# Patient Record
Sex: Female | Born: 1965
Health system: Southern US, Community
[De-identification: ages and names within clinical notes are randomized; demographics above are authoritative.]

## PROBLEM LIST (undated history)

## (undated) DIAGNOSIS — K449 Diaphragmatic hernia without obstruction or gangrene: Secondary | ICD-10-CM

## (undated) DIAGNOSIS — I491 Atrial premature depolarization: Secondary | ICD-10-CM

## (undated) DIAGNOSIS — I10 Essential (primary) hypertension: Secondary | ICD-10-CM

## (undated) DIAGNOSIS — Z9889 Other specified postprocedural states: Secondary | ICD-10-CM

## (undated) DIAGNOSIS — E282 Polycystic ovarian syndrome: Secondary | ICD-10-CM

## (undated) DIAGNOSIS — K219 Gastro-esophageal reflux disease without esophagitis: Secondary | ICD-10-CM

## (undated) DIAGNOSIS — D509 Iron deficiency anemia, unspecified: Secondary | ICD-10-CM

## (undated) DIAGNOSIS — IMO0001 Reserved for inherently not codable concepts without codable children: Secondary | ICD-10-CM

## (undated) DIAGNOSIS — Z9109 Other allergy status, other than to drugs and biological substances: Secondary | ICD-10-CM

## (undated) DIAGNOSIS — J302 Other seasonal allergic rhinitis: Secondary | ICD-10-CM

## (undated) DIAGNOSIS — F419 Anxiety disorder, unspecified: Secondary | ICD-10-CM

## (undated) DIAGNOSIS — R112 Nausea with vomiting, unspecified: Secondary | ICD-10-CM

## (undated) DIAGNOSIS — E785 Hyperlipidemia, unspecified: Secondary | ICD-10-CM

## (undated) DIAGNOSIS — R002 Palpitations: Secondary | ICD-10-CM

## (undated) DIAGNOSIS — R7303 Prediabetes: Secondary | ICD-10-CM

## (undated) DIAGNOSIS — G43909 Migraine, unspecified, not intractable, without status migrainosus: Secondary | ICD-10-CM

## (undated) HISTORY — DX: Diaphragmatic hernia without obstruction or gangrene: K44.9

## (undated) HISTORY — DX: Polycystic ovarian syndrome: E28.2

## (undated) HISTORY — DX: Essential (primary) hypertension: I10

## (undated) HISTORY — DX: Other seasonal allergic rhinitis: J30.2

## (undated) HISTORY — DX: Atrial premature depolarization: I49.1

## (undated) HISTORY — DX: Gastro-esophageal reflux disease without esophagitis: K21.9

## (undated) HISTORY — DX: Reserved for inherently not codable concepts without codable children: IMO0001

## (undated) HISTORY — DX: Migraine, unspecified, not intractable, without status migrainosus: G43.909

## (undated) HISTORY — DX: Iron deficiency anemia, unspecified: D50.9

## (undated) HISTORY — DX: Hyperlipidemia, unspecified: E78.5

---
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
## 1998-02-18 ENCOUNTER — Emergency Department (HOSPITAL_COMMUNITY): Admission: EM | Admit: 1998-02-18 | Discharge: 1998-02-18 | Payer: Self-pay | Admitting: Emergency Medicine

## 1998-04-16 ENCOUNTER — Emergency Department (HOSPITAL_COMMUNITY): Admission: EM | Admit: 1998-04-16 | Discharge: 1998-04-16 | Payer: Self-pay | Admitting: Emergency Medicine

## 1999-02-09 ENCOUNTER — Other Ambulatory Visit: Admission: RE | Admit: 1999-02-09 | Discharge: 1999-02-09 | Payer: Self-pay | Admitting: *Deleted

## 1999-04-03 ENCOUNTER — Other Ambulatory Visit: Admission: RE | Admit: 1999-04-03 | Discharge: 1999-04-03 | Payer: Self-pay | Admitting: *Deleted

## 1999-09-26 ENCOUNTER — Other Ambulatory Visit: Admission: RE | Admit: 1999-09-26 | Discharge: 1999-09-26 | Payer: Self-pay | Admitting: *Deleted

## 2000-01-30 ENCOUNTER — Other Ambulatory Visit: Admission: RE | Admit: 2000-01-30 | Discharge: 2000-01-30 | Payer: Self-pay | Admitting: *Deleted

## 2000-10-15 ENCOUNTER — Other Ambulatory Visit: Admission: RE | Admit: 2000-10-15 | Discharge: 2000-10-15 | Payer: Self-pay | Admitting: *Deleted

## 2001-02-19 ENCOUNTER — Other Ambulatory Visit: Admission: RE | Admit: 2001-02-19 | Discharge: 2001-02-19 | Payer: Self-pay | Admitting: *Deleted

## 2001-10-29 ENCOUNTER — Other Ambulatory Visit: Admission: RE | Admit: 2001-10-29 | Discharge: 2001-10-29 | Payer: Self-pay | Admitting: Obstetrics and Gynecology

## 2002-03-01 ENCOUNTER — Emergency Department (HOSPITAL_COMMUNITY): Admission: EM | Admit: 2002-03-01 | Discharge: 2002-03-01 | Payer: Self-pay | Admitting: Emergency Medicine

## 2002-03-01 ENCOUNTER — Encounter: Payer: Self-pay | Admitting: Emergency Medicine

## 2002-03-17 ENCOUNTER — Encounter: Payer: Self-pay | Admitting: Gastroenterology

## 2002-03-17 ENCOUNTER — Ambulatory Visit (HOSPITAL_COMMUNITY): Admission: RE | Admit: 2002-03-17 | Discharge: 2002-03-17 | Payer: Self-pay | Admitting: Gastroenterology

## 2002-10-06 ENCOUNTER — Ambulatory Visit (HOSPITAL_COMMUNITY): Admission: RE | Admit: 2002-10-06 | Discharge: 2002-10-06 | Payer: Self-pay | Admitting: Gastroenterology

## 2002-11-18 ENCOUNTER — Other Ambulatory Visit: Admission: RE | Admit: 2002-11-18 | Discharge: 2002-11-18 | Payer: Self-pay | Admitting: Obstetrics and Gynecology

## 2002-12-10 ENCOUNTER — Encounter: Payer: Self-pay | Admitting: Emergency Medicine

## 2002-12-10 ENCOUNTER — Emergency Department (HOSPITAL_COMMUNITY): Admission: EM | Admit: 2002-12-10 | Discharge: 2002-12-10 | Payer: Self-pay | Admitting: Emergency Medicine

## 2004-07-24 ENCOUNTER — Other Ambulatory Visit: Admission: RE | Admit: 2004-07-24 | Discharge: 2004-07-24 | Payer: Self-pay | Admitting: Internal Medicine

## 2005-06-21 ENCOUNTER — Inpatient Hospital Stay (HOSPITAL_COMMUNITY): Admission: AD | Admit: 2005-06-21 | Discharge: 2005-06-21 | Payer: Self-pay | Admitting: Obstetrics and Gynecology

## 2005-07-12 ENCOUNTER — Encounter (INDEPENDENT_AMBULATORY_CARE_PROVIDER_SITE_OTHER): Payer: Self-pay | Admitting: Specialist

## 2005-07-12 ENCOUNTER — Ambulatory Visit (HOSPITAL_COMMUNITY): Admission: RE | Admit: 2005-07-12 | Discharge: 2005-07-12 | Payer: Self-pay | Admitting: Obstetrics and Gynecology

## 2006-02-06 ENCOUNTER — Ambulatory Visit (HOSPITAL_COMMUNITY): Admission: AD | Admit: 2006-02-06 | Discharge: 2006-02-06 | Payer: Self-pay | Admitting: Obstetrics and Gynecology

## 2006-08-11 ENCOUNTER — Inpatient Hospital Stay (HOSPITAL_COMMUNITY): Admission: AD | Admit: 2006-08-11 | Discharge: 2006-08-11 | Payer: Self-pay | Admitting: Obstetrics and Gynecology

## 2006-08-27 ENCOUNTER — Inpatient Hospital Stay (HOSPITAL_COMMUNITY): Admission: RE | Admit: 2006-08-27 | Discharge: 2006-08-28 | Payer: Self-pay | Admitting: Obstetrics and Gynecology

## 2007-02-15 ENCOUNTER — Emergency Department (HOSPITAL_COMMUNITY): Admission: EM | Admit: 2007-02-15 | Discharge: 2007-02-15 | Payer: Self-pay | Admitting: Family Medicine

## 2008-01-25 ENCOUNTER — Emergency Department (HOSPITAL_COMMUNITY): Admission: EM | Admit: 2008-01-25 | Discharge: 2008-01-25 | Payer: Self-pay | Admitting: Emergency Medicine

## 2008-04-15 ENCOUNTER — Ambulatory Visit: Payer: Self-pay | Admitting: Internal Medicine

## 2008-04-15 DIAGNOSIS — J309 Allergic rhinitis, unspecified: Secondary | ICD-10-CM | POA: Insufficient documentation

## 2008-04-15 DIAGNOSIS — J45909 Unspecified asthma, uncomplicated: Secondary | ICD-10-CM | POA: Insufficient documentation

## 2008-04-15 DIAGNOSIS — E785 Hyperlipidemia, unspecified: Secondary | ICD-10-CM

## 2008-04-15 HISTORY — DX: Hyperlipidemia, unspecified: E78.5

## 2008-04-21 ENCOUNTER — Ambulatory Visit: Payer: Self-pay | Admitting: Internal Medicine

## 2008-04-21 DIAGNOSIS — J069 Acute upper respiratory infection, unspecified: Secondary | ICD-10-CM | POA: Insufficient documentation

## 2008-04-30 ENCOUNTER — Emergency Department (HOSPITAL_COMMUNITY): Admission: EM | Admit: 2008-04-30 | Discharge: 2008-04-30 | Payer: Self-pay | Admitting: Emergency Medicine

## 2008-05-12 ENCOUNTER — Encounter: Payer: Self-pay | Admitting: Internal Medicine

## 2008-05-19 ENCOUNTER — Encounter: Admission: RE | Admit: 2008-05-19 | Discharge: 2008-05-19 | Payer: Self-pay | Admitting: *Deleted

## 2008-05-27 ENCOUNTER — Ambulatory Visit (HOSPITAL_COMMUNITY): Admission: RE | Admit: 2008-05-27 | Discharge: 2008-05-27 | Payer: Self-pay | Admitting: *Deleted

## 2008-06-01 ENCOUNTER — Ambulatory Visit (HOSPITAL_COMMUNITY): Admission: RE | Admit: 2008-06-01 | Discharge: 2008-06-01 | Payer: Self-pay | Admitting: *Deleted

## 2008-06-14 ENCOUNTER — Encounter: Payer: Self-pay | Admitting: Internal Medicine

## 2008-07-04 ENCOUNTER — Encounter: Payer: Self-pay | Admitting: Internal Medicine

## 2008-07-20 ENCOUNTER — Encounter: Admission: RE | Admit: 2008-07-20 | Discharge: 2008-09-13 | Payer: Self-pay | Admitting: *Deleted

## 2008-07-28 ENCOUNTER — Encounter: Payer: Self-pay | Admitting: Internal Medicine

## 2008-08-08 ENCOUNTER — Ambulatory Visit (HOSPITAL_COMMUNITY): Admission: RE | Admit: 2008-08-08 | Discharge: 2008-08-08 | Payer: Self-pay | Admitting: *Deleted

## 2008-08-08 HISTORY — PX: LAPAROSCOPIC GASTRIC BANDING: SHX1100

## 2008-09-01 ENCOUNTER — Encounter: Payer: Self-pay | Admitting: Internal Medicine

## 2008-09-22 ENCOUNTER — Encounter: Admission: RE | Admit: 2008-09-22 | Discharge: 2008-09-22 | Payer: Self-pay | Admitting: *Deleted

## 2008-11-14 ENCOUNTER — Telehealth: Payer: Self-pay | Admitting: Internal Medicine

## 2008-11-23 ENCOUNTER — Encounter: Payer: Self-pay | Admitting: Internal Medicine

## 2009-01-25 ENCOUNTER — Encounter: Payer: Self-pay | Admitting: Internal Medicine

## 2009-03-03 ENCOUNTER — Encounter: Payer: Self-pay | Admitting: Internal Medicine

## 2009-09-01 ENCOUNTER — Ambulatory Visit: Payer: Self-pay | Admitting: Internal Medicine

## 2009-12-06 ENCOUNTER — Telehealth: Payer: Self-pay | Admitting: Internal Medicine

## 2010-02-03 ENCOUNTER — Emergency Department (HOSPITAL_BASED_OUTPATIENT_CLINIC_OR_DEPARTMENT_OTHER): Admission: EM | Admit: 2010-02-03 | Discharge: 2010-02-03 | Payer: Self-pay | Admitting: Emergency Medicine

## 2010-02-03 ENCOUNTER — Ambulatory Visit: Payer: Self-pay | Admitting: Radiology

## 2010-03-05 ENCOUNTER — Ambulatory Visit (HOSPITAL_COMMUNITY): Admission: RE | Admit: 2010-03-05 | Discharge: 2010-03-05 | Payer: Self-pay | Admitting: Otolaryngology

## 2010-03-08 ENCOUNTER — Emergency Department (HOSPITAL_COMMUNITY): Admission: EM | Admit: 2010-03-08 | Discharge: 2010-03-08 | Payer: Self-pay | Admitting: Emergency Medicine

## 2010-05-03 ENCOUNTER — Ambulatory Visit: Payer: Self-pay | Admitting: Internal Medicine

## 2010-05-03 LAB — CONVERTED CEMR LAB
AST: 18 units/L (ref 0–37)
Albumin: 4.2 g/dL (ref 3.5–5.2)
Basophils Relative: 0.8 % (ref 0.0–3.0)
Bilirubin, Direct: 0.1 mg/dL (ref 0.0–0.3)
Chloride: 106 meq/L (ref 96–112)
Creatinine, Ser: 0.7 mg/dL (ref 0.4–1.2)
Direct LDL: 131.2 mg/dL
Eosinophils Relative: 2.9 % (ref 0.0–5.0)
GFR calc non Af Amer: 97.46 mL/min (ref >60)
Glucose, Urine, Semiquant: NEGATIVE
HCT: 40.4 % (ref 36.0–46.0)
HDL: 55 mg/dL (ref >39.00)
Hemoglobin: 13.8 g/dL (ref 12.0–15.0)
Lymphocytes Relative: 33 % (ref 12.0–46.0)
MCHC: 34.2 g/dL (ref 30.0–36.0)
Monocytes Absolute: 0.4 10*3/uL (ref 0.1–1.0)
Neutro Abs: 3.7 10*3/uL (ref 1.4–7.7)
Platelets: 207 10*3/uL (ref 150.0–400.0)
RDW: 13.3 % (ref 11.5–14.6)
Specific Gravity, Urine: >=1.03
TSH: 2.1 microintl units/mL (ref 0.35–5.50)
Total Protein: 7.1 g/dL (ref 6.0–8.3)
WBC Urine, dipstick: NEGATIVE
pH: 5.5

## 2010-05-14 ENCOUNTER — Ambulatory Visit: Payer: Self-pay | Admitting: Internal Medicine

## 2010-06-29 ENCOUNTER — Emergency Department (HOSPITAL_COMMUNITY): Admission: EM | Admit: 2010-06-29 | Discharge: 2010-06-29 | Payer: Self-pay | Admitting: Family Medicine

## 2010-09-24 ENCOUNTER — Encounter: Admission: RE | Admit: 2010-09-24 | Discharge: 2010-09-24 | Payer: Self-pay | Admitting: Surgery

## 2010-12-04 ENCOUNTER — Ambulatory Visit (HOSPITAL_COMMUNITY)
Admission: RE | Admit: 2010-12-04 | Discharge: 2010-12-04 | Payer: Self-pay | Source: Home / Self Care | Attending: Surgery | Admitting: Surgery

## 2010-12-04 NOTE — Progress Notes (Signed)
Summary: pinkeye  Phone Note Call from Patient Call back at [facility] Phone [phone]   Summary of Call: Has gotten pinkeye from her 43 year old son who is on augmentin for it & other problems.  For her pinkeye, crusty, gross right eye she awoke with this am, she is requesting callin med gtts to [facility].  Allerigc to Pcn. Initial call taken by: Rudy Jew, RN,  December 06, 2009 9:32 AM  Follow-up for Phone Call        Generic bleph 10 ophth drops 10 cc 2 gtts QID  Follow-up by: Gordy Savers  MD,  December 06, 2009 12:35 PM    New/Updated Medications: BLEPH-10 10 % SOLN (SULFACETAMIDE SODIUM) 2 drops qid Prescriptions: BLEPH-10 10 % SOLN (SULFACETAMIDE SODIUM) 2 drops qid  #1 x 0   Entered by:   Rudy Jew, RN   Authorized by:   Gordy Savers  MD   Signed by:   Rudy Jew, RN on 12/06/2009   Method used:   Electronically to        [phone] (retail)       [address]       [place], [place]  [address]       Ph: [phone] or [phone]       Fax: [phone]   RxID:   [id number]

## 2010-12-04 NOTE — Assessment & Plan Note (Signed)
Summary: cpx//ccm   Vital Signs:  Patient profile:   45 year old female Height:      68 inches Weight:      245 pounds BMI:     37.39 Temp:     98.1 degrees F oral BP sitting:   112 / 78  (right arm) Cuff size:   regular  Vitals Entered By: Duard Brady LPN (May 14, 2010 1:58 PM) CC: cpx - doing well Is Patient Diabetic? No   CC:  cpx - doing well.  History of Present Illness: 45 year old patient who is seen today for a health maintenance  examination.  She presently is participating in a NP program at [facility].  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  She is doing quite well.  She is followed by allergy for her asthma and allergic eyes.  She has a history of mild dyslipidemia, and exogenous obesity.  Allergies: 1)  ! Penicillin G Sodium (Penicillin G Sodium)  Past History:  Past Medical History: Reviewed history from 04/15/2008 and no changes required. Allergic rhinitis Asthma obesity history migraine headaches Hyperlipidemia borderline gravida two, para two, abortus zero polycystic ovarian syndrome  Past Surgical History: Tonsillectomy  1979 Colonoscopy 2005  s/p lap band 10/09  Family History: Reviewed history from 04/15/2008 and no changes required. father age 17, hypertension, obesity mother, age 36, hypertension, type 2 diabetes, obesity, colonic polyps  one sister, obesity, cardiac dysrhythmia  Social History: Reviewed history from 04/15/2008 and no changes required. 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    Physical Exam  General:  overweight-appearing.  low-normal blood pressureoverweight-appearing.   Head:  Normocephalic and atraumatic without obvious abnormalities. No apparent alopecia or balding. Eyes:  No corneal or conjunctival inflammation noted. EOMI. Perrla. Funduscopic exam benign, without hemorrhages, exudates or papilledema. Vision grossly normal. Ears:  External ear exam shows no significant lesions or deformities.  Otoscopic examination reveals clear canals, tympanic membranes are intact bilaterally without bulging, retraction, inflammation or discharge. Hearing is grossly normal bilaterally. Nose:  External nasal examination shows no deformity or inflammation. Nasal mucosa are pink and moist without lesions or exudates. Mouth:  Oral mucosa and oropharynx without lesions or exudates.  Teeth in good repair. Neck:  No deformities, masses, or tenderness noted. Chest Wall:  No deformities, masses, or tenderness noted. Lungs:  Normal respiratory effort, chest expands symmetrically. Lungs are clear to auscultation, no crackles or wheezes. Heart:  Normal rate and regular rhythm. S1 and S2 normal without gallop, murmur, click, rub or other extra sounds. Abdomen:  Bowel sounds positive,abdomen soft and non-tender without masses, organomegaly or hernias noted. Msk:  No deformity or scoliosis noted of thoracic or lumbar spine.   Pulses:  R and L carotid,radial,femoral,dorsalis pedis and posterior tibial pulses are full and equal bilaterally Extremities:  No clubbing, cyanosis, edema, or deformity noted with normal full range of motion of all joints.   Neurologic:  No cranial nerve deficits noted. Station and gait are normal. Plantar reflexes are down-going bilaterally. DTRs are symmetrical throughout. Sensory, motor and coordinative functions appear intact. Skin:  Intact without  suspicious lesions or rashes Cervical Nodes:  No lymphadenopathy noted Axillary Nodes:  No palpable lymphadenopathy Inguinal Nodes:  No significant adenopathy Psych:  Cognition and judgment appear intact. Alert and cooperative with normal attention span and concentration. No apparent delusions, illusions, hallucinations   Impression & Recommendations:  Problem # 1:  Preventive Health Care (ICD-V70.0)  Complete Medication List: 1)  Xopenex Hfa 45 Mcg/act Aero (Levalbuterol tartrate) .... Uad 2)  Singulair 10 Mg Tabs (Montelukast sodium) .... One daily  Patient Instructions: 1)  It is important that you exercise regularly at least 20 minutes 5 times a week. If you develop chest pain, have severe difficulty breathing, or feel very tired , stop exercising immediately and seek medical attention. 2)  You need to lose weight. Consider a lower calorie diet and regular exercise.  3)  gynecologic follow-up as planned Prescriptions: SINGULAIR 10 MG  TABS (MONTELUKAST SODIUM) one daily  #90 x 6   Entered and Authorized by:   Gordy Savers  MD   Signed by:   Gordy Savers  MD on 05/14/2010   Method used:   Print then Give to Patient   RxID:   [id number] HFA 45 MCG/ACT  AERO (LEVALBUTEROL TARTRATE) UAD  #3 x 6   Entered and Authorized by:   Gordy Savers  MD   Signed by:   Gordy Savers  MD on 05/14/2010   Method used:   Print then Give to Patient   RxID:   [id number]

## 2010-12-13 NOTE — Op Note (Signed)
Olivia [name], Olivia [name]             ACCOUNT NO.:  [id number]  MEDICAL RECORD NO.:  [id number]          PATIENT TYPE:  AMB  LOCATION:  ENDO                         FACILITY:  [facility]  PHYSICIAN:  Sandria Bales. Ezzard Standing, M.D.  DATE OF BIRTH:  [birth year]  DATE OF PROCEDURE: 04 December 2010                              OPERATIVE REPORT   PREOPERATIVE DIAGNOSIS:  Gastroesophageal reflux/lap band.  POSTOPERATIVE DIAGNOSIS:  Normal position of lap band with normal esophagus, stomach, and duodenum.  PROCEDURES:  Esophagogastroduodenoscopy.  SURGEON:  Sandria Bales. Ezzard Standing, MD  ANESTHESIA:  Fentanyl 75 mcg and Versed 7.5 mg.  COMPLICATIONS:  None.  INDICATION FOR PROCEDURE:  Olivia [name] is a 45 year old white female, who sees Dr. Eleonore Chiquito as her primary medical doctor.  She had an AP standard lap band placed by Dr. Baruch Merl in October 2009 and did well for about 2 years.  She has recently struggled with some gastroesophageal reflux.  She has seen Lenard Forth, physician assistant's office with band fills and has struggled a little bit.  She had an upper GI on September 24, 2010, that showed some full colon gastroesophageal reflux.  Mardelle Matte gave her a band holiday.  They started refilling fluid back.  She is doing better, but now comes for endoscopy just to document the position of the band.  The indications and the potential complications of procedure explained to the patient. Potential complications include, but are not limited to bleeding, infection, bowel injury.  OPERATIVE NOTE:  The patient was placed in a supine position.  The back of her throat was anesthetized with Cetacaine.  She was then rolled in a left lateral decubitus position.  A bite block was placed in her mouth. She was monitored with EKG, pulse oximetry and blood pressure cuff and had 2 L of nasal O2 flowing during the procedure.  She was given 7.5 mg of Versed, 75 mcg of fentanyl.  I used a flexible Pentax upper  endoscope and passed it down the back of her throat without difficulty.  I advanced the scope into the duodenum, which was unremarkable.  The pylorus was unremarkable.  The stomach itself was unremarkable.  I retroflexed the scope and identified the imprint of the band, which was unremarkable.  No evidence of erosion or ulcer.    The band orifice was at about 42 cm.  The Z-line was about 40 cm for about a 2-3 cm pouch, which repaired appropriate.  The distal esophagus was unremarkable for any gastritis or esophagitis or inflammation and the scope was then withdrawn.  The patient tolerated the procedure well.  The evaluation was considered normal.  We will continue to tighten her band.  In talking to her, probably her band at one time was too tight and I think just conceptually understanding that you can not leave the band so tight, that it stops liquids.   Sandria Bales. Ezzard Standing, M.D., FACS   DHN/MEDQ  D:  12/04/2010  T:  12/04/2010  Job:  [id number]  cc:   Gordy Savers, MD [address] [place] [address]  Electronically Signed by Ovidio Kin M.D. on 12/13/2010  11:11:46 AM

## 2011-01-26 ENCOUNTER — Emergency Department (HOSPITAL_COMMUNITY)
Admission: EM | Admit: 2011-01-26 | Discharge: 2011-01-26 | Disposition: A | Discharge status: Home / Self Care | Payer: 59 | Attending: Emergency Medicine | Admitting: Emergency Medicine

## 2011-01-26 DIAGNOSIS — R300 Dysuria: Secondary | ICD-10-CM | POA: Insufficient documentation

## 2011-01-26 DIAGNOSIS — N12 Tubulo-interstitial nephritis, not specified as acute or chronic: Secondary | ICD-10-CM | POA: Insufficient documentation

## 2011-01-26 DIAGNOSIS — J45909 Unspecified asthma, uncomplicated: Secondary | ICD-10-CM | POA: Insufficient documentation

## 2011-01-26 DIAGNOSIS — R509 Fever, unspecified: Secondary | ICD-10-CM | POA: Insufficient documentation

## 2011-01-26 DIAGNOSIS — R109 Unspecified abdominal pain: Secondary | ICD-10-CM | POA: Insufficient documentation

## 2011-01-26 LAB — URINE MICROSCOPIC-ADD ON

## 2011-01-26 LAB — DIFFERENTIAL
Eosinophils Absolute: 0 10*3/uL (ref 0.0–0.7)
Eosinophils Relative: 0 % (ref 0–5)
Lymphocytes Relative: 2 % — ABNORMAL LOW (ref 12–46)
Lymphs Abs: 0.1 10*3/uL — ABNORMAL LOW (ref 0.7–4.0)
Monocytes Absolute: 0.3 10*3/uL (ref 0.1–1.0)
Monocytes Relative: 5 % (ref 3–12)
Neutro Abs: 5.2 10*3/uL (ref 1.7–7.7)

## 2011-01-26 LAB — BASIC METABOLIC PANEL
BUN: 9 mg/dL (ref 6–23)
Chloride: 104 mEq/L (ref 96–112)
GFR calc Af Amer: >60 mL/min (ref >60)
Glucose, Bld: 128 mg/dL — ABNORMAL HIGH (ref 70–99)
Potassium: 3.5 mEq/L (ref 3.5–5.1)

## 2011-01-26 LAB — URINALYSIS, ROUTINE W REFLEX MICROSCOPIC
Glucose, UA: NEGATIVE mg/dL
Urobilinogen, UA: 2 mg/dL — ABNORMAL HIGH (ref 0.0–1.0)

## 2011-01-26 LAB — CBC
RDW: 13.7 % (ref 11.5–15.5)
WBC: 5.6 10*3/uL (ref 4.0–10.5)

## 2011-01-27 ENCOUNTER — Inpatient Hospital Stay (HOSPITAL_COMMUNITY)
Admission: EM | Admit: 2011-01-27 | Discharge: 2011-01-29 | DRG: 690 | Disposition: A | Discharge status: Home / Self Care | Payer: 59 | Attending: Internal Medicine | Admitting: Internal Medicine

## 2011-01-27 ENCOUNTER — Emergency Department (HOSPITAL_COMMUNITY): Payer: 59

## 2011-01-27 DIAGNOSIS — R1115 Cyclical vomiting syndrome unrelated to migraine: Secondary | ICD-10-CM | POA: Diagnosis present

## 2011-01-27 DIAGNOSIS — R7989 Other specified abnormal findings of blood chemistry: Secondary | ICD-10-CM | POA: Diagnosis present

## 2011-01-27 DIAGNOSIS — E876 Hypokalemia: Secondary | ICD-10-CM | POA: Diagnosis present

## 2011-01-27 DIAGNOSIS — T50995A Adverse effect of other drugs, medicaments and biological substances, initial encounter: Secondary | ICD-10-CM | POA: Diagnosis present

## 2011-01-27 DIAGNOSIS — N390 Urinary tract infection, site not specified: Principal | ICD-10-CM | POA: Diagnosis present

## 2011-01-27 DIAGNOSIS — K219 Gastro-esophageal reflux disease without esophagitis: Secondary | ICD-10-CM | POA: Diagnosis present

## 2011-01-27 DIAGNOSIS — Z9884 Bariatric surgery status: Secondary | ICD-10-CM

## 2011-01-27 LAB — CBC
Hemoglobin: 12.3 g/dL (ref 12.0–15.0)
MCV: 80.2 fL (ref 78.0–100.0)
Platelets: 154 10*3/uL (ref 150–400)
RDW: 13.9 % (ref 11.5–15.5)

## 2011-01-27 LAB — DIFFERENTIAL
Basophils Relative: 0 % (ref 0–1)
Eosinophils Absolute: 0.1 10*3/uL (ref 0.0–0.7)
Eosinophils Relative: 3 % (ref 0–5)
Lymphocytes Relative: 6 % — ABNORMAL LOW (ref 12–46)
Monocytes Absolute: 0.3 10*3/uL (ref 0.1–1.0)
Neutro Abs: 4.2 10*3/uL (ref 1.7–7.7)

## 2011-01-27 LAB — BASIC METABOLIC PANEL
BUN: 12 mg/dL (ref 6–23)
CO2: 21 mEq/L (ref 19–32)
Calcium: 8.1 mg/dL — ABNORMAL LOW (ref 8.4–10.5)
Chloride: 105 mEq/L (ref 96–112)
Glucose, Bld: 150 mg/dL — ABNORMAL HIGH (ref 70–99)

## 2011-01-27 LAB — HEPATIC FUNCTION PANEL
Bilirubin, Direct: 1.6 mg/dL — ABNORMAL HIGH (ref 0.0–0.3)
Indirect Bilirubin: 0.8 mg/dL (ref 0.3–0.9)
Total Bilirubin: 2.4 mg/dL — ABNORMAL HIGH (ref 0.3–1.2)

## 2011-01-27 LAB — URINALYSIS, ROUTINE W REFLEX MICROSCOPIC
Glucose, UA: NEGATIVE mg/dL
Ketones, ur: NEGATIVE mg/dL
Protein, ur: NEGATIVE mg/dL

## 2011-01-27 LAB — URINE CULTURE: Colony Count: NO GROWTH

## 2011-01-28 ENCOUNTER — Inpatient Hospital Stay (HOSPITAL_COMMUNITY): Payer: 59

## 2011-01-28 LAB — URINE CULTURE: Colony Count: NO GROWTH

## 2011-01-28 LAB — GLUCOSE, CAPILLARY: Glucose-Capillary: 87 mg/dL (ref 70–99)

## 2011-01-28 LAB — COMPREHENSIVE METABOLIC PANEL
Alkaline Phosphatase: 124 U/L — ABNORMAL HIGH (ref 39–117)
BUN: 4 mg/dL — ABNORMAL LOW (ref 6–23)
CO2: 21 mEq/L (ref 19–32)
Chloride: 107 mEq/L (ref 96–112)
Creatinine, Ser: 0.6 mg/dL (ref 0.4–1.2)
GFR calc non Af Amer: >60 mL/min (ref >60)
Glucose, Bld: 92 mg/dL (ref 70–99)
Total Bilirubin: 2.9 mg/dL — ABNORMAL HIGH (ref 0.3–1.2)

## 2011-01-28 LAB — LIPASE, BLOOD: Lipase: 18 U/L (ref 11–59)

## 2011-01-29 LAB — COMPREHENSIVE METABOLIC PANEL
ALT: 54 U/L — ABNORMAL HIGH (ref 0–35)
AST: 69 U/L — ABNORMAL HIGH (ref 0–37)
Calcium: 8.1 mg/dL — ABNORMAL LOW (ref 8.4–10.5)
Creatinine, Ser: 0.64 mg/dL (ref 0.4–1.2)
GFR calc Af Amer: >60 mL/min (ref >60)
Sodium: 135 mEq/L (ref 135–145)
Total Protein: 5.5 g/dL — ABNORMAL LOW (ref 6.0–8.3)

## 2011-01-29 LAB — HEPATITIS PANEL, ACUTE
HCV Ab: NEGATIVE
Hep A IgM: NEGATIVE
Hep B C IgM: NEGATIVE

## 2011-01-31 NOTE — H&P (Signed)
NAMEGIOVANA, FACIANE             ACCOUNT NO.:  [id number]  MEDICAL RECORD NO.:  [id number]           PATIENT TYPE:  I  LOCATION:  1507                         FACILITY:  [facility]  PHYSICIAN:  Kela Millin, M.D.DATE OF BIRTH:  June 23, 1966  DATE OF ADMISSION:  01/27/2011 DATE OF DISCHARGE:                             HISTORY & PHYSICAL   PRIMARY CARE PHYSICIAN:  Unassigned.  CHIEF COMPLAINT:  Fevers with persistent nausea and dry heaves.  HISTORY OF PRESENT ILLNESS:  The patient is a 45 year old white female with a history of gastric/lap band, GERD, and diagnosed with urinary tract infection 3 days ago and was started on Septra.  She states that her nausea and dry heaving got worse after she was started on the Septra.  She states that she has had minimal-to-no intake because of the persistent nausea and dry heaving.  She states that she was seen in the ED 2 days ago and hydrated with IV fluids, given IV antibiotics, and discharged on Cipro along with Zofran and she felt better initially, but began having the persistent nausea and dry heaving again and so came back to the ED this morning.  She admits to lower abdominal pain as well as dysuria.  She states that she has had fevers for 3-4 days along with right flank pain.  She also complains of dizziness with standing up and has had headaches as well.  No focal weakness.  In the ED, she had a CT scan of her abdomen and pelvis and it showed that she is status post gastric banding without evidence of complications and no acute findings.  Her potassium came back at 3.3 with a sodium of 132 and urinalysis was cloudy in appearance with small leukocyte esterase and 7-10 wbc's, but this is a contaminated specimen with many epithelial cells.  She is admitted for further evaluation and management.  PAST MEDICAL HISTORY: 1. As above. 2. History of asthma - well controlled.  Only uses her inhalers about     1 to 2 times a year. 3.  History of scoliosis. 4. History of seasonal allergies. 5. History of polycystic ovarian syndrome.  ALLERGIES:  PENICILLIN.  SOCIAL HISTORY:  She denies tobacco, and occasional alcohol.  She is a Engineer, civil (consulting).  FAMILY HISTORY:  Positive for hypertension, heart disease, diabetes, and colon cancer.  REVIEW OF SYSTEMS:  As per HPI, other review of systems negative.  PHYSICAL EXAMINATION:  GENERAL:  The patient is a pleasant middle-aged white female in no respiratory distress, intermittently dry heaving during this interview. VITAL SIGNS:  Her temperature is 98.8 and blood pressure 111/74, low of 99/64 in the ED.  Her pulse is 66 initially, larger than one.  The respiratory rate is 18, O2 sat of 98%. HEENT:  PERRL, EOMI, slightly dry mucous membranes.  No oral exudates. NECK:  Supple, no adenopathy, no thyromegaly, and no JVD. LUNGS:  Clear to auscultation bilaterally.  No crackles or wheezes. CARDIOVASCULAR:  Regular rate and rhythm.  Normal S1 and S2. ABDOMEN:  Soft, bowel sounds present, mild suprapubic tenderness, no rebound tenderness.  No CVA tenderness.  No organomegaly. EXTREMITIES:  No  cyanosis and no edema. NEURO:  Alert and oriented x3.  Cranial nerves II through XII grossly intact.  Nonfocal exam.  LABORATORY DATA:  As per HPI.  Also, her white cell count is 4.9 with a hemoglobin of 12.3, hematocrit of 37.6, and platelet count 154, neutrophil count 86%.  Sodium is 132 with a potassium of 3.3, chloride 105, CO2 of 21, glucose 150, BUN of 12, creatinine 0.78, calcium 8.1. Urinalysis as per HPI as well as CT scan of the abdomen and pelvis.  ASSESSMENT AND PLAN: 1. Urinary tract infection, with persistent nausea and dry heaving -     we will repeat a UA and cultures, empiric antibiotics, antiemetics,     and supportive care. 2. Volume depletion - with probable orthostatic hypotension, will     check orthostatic vital signs, hydrate, follow and recheck. 3. Hypokalemia - replace  potassium. 4. Gastroesophageal reflux disease - will place on PPI also add     Reglan. 5. Status post LAP-BAND for obesity. 6. History of asthma - stable, p.r.n. nebs. 7. History of seasonal allergies - continue Claritin. 8. History of scoliosis. 9. History of polycystic ovarian syndrome.     Kela Millin, M.D.     ACV/MEDQ  D:  01/27/2011  T:  01/27/2011  Job:  [id number]  Electronically Signed by Donnalee Curry M.D. on 01/31/2011 10:45:57 AM

## 2011-01-31 NOTE — Discharge Summary (Signed)
Olivia [name], Olivia [name]             ACCOUNT NO.:  [id number]  MEDICAL RECORD NO.:  [id number]           PATIENT TYPE:  I  LOCATION:  1507                         FACILITY:  [facility]  PHYSICIAN:  Kela Millin, M.D.DATE OF BIRTH:  1966-04-18  DATE OF ADMISSION:  01/27/2011 DATE OF DISCHARGE:  01/29/2011                        DISCHARGE SUMMARY - REFERRING   DISCHARGE DIAGNOSES: 1. Elevated liver function tests, unclear etiology, ? medications.     Acute hepatitis panel pending at the time of this dictation, follow     up with primary care physician for results and for repeat liver     function tests in 2 to 3 weeks. 2. Partially treated urinary tract infection. 3. Persistent nausea and vomiting, improved.  Consider outpatient     hepatobiliary scan per primary care physician if recurs.  Elevated     liver function tests and urinary tract infection contributing     factors. 4. Status post gastric band.  Follow up with Dr. Ezzard Standing in 4 weeks. 5. History of asthma, stable. 6. History of scoliosis. 7. History of seasonal allergies. 8. History of polycystic ovarian syndrome.  CONSULTATIONS:  General Surgery, Sandria Bales. Ezzard Standing, M.D.  PROCEDURES AND STUDIES: 1. CT scan of abdomen and pelvis status post gastric banding without     evidence of complication.  No acute intraabdominal pathology. 2. Abdominal ultrasound on March 26:  Poor distention of the     gallbladder may account for the apparent gallbladder wall     thickening.  No gallstones identified, although sensitivity is     limited.  No pericholecystic fluid, no sonographic Murphy's sign     reported.  No intrahepatic or extrahepatic biliary duct dilatation.  BRIEF HISTORY:  The patient is a 45 year old white female with the above- listed medical problems who presented with fevers and persistent nausea and dry heaves over a 3 to 4-days period.  She stated that initially she had been started on Septra but that worsened her  symptoms and so she came to the ED and was hydrated initially and given IV antibiotics and discharged home on Cipro and Zofran and initially felt better, but the symptoms began to worsen again thereafter and so she came back to the ED and was admitted for further evaluation and management.  She admitted to right flank pain.  Also admitted to dizziness and headaches.  She was admitted for further evaluation and management.  HOSPITAL COURSE BY PROBLEM: 1. Partially treated urinary tract infection.  Upon admission she had     a urinalysis done, but it was contaminated with many epithelial     cells and a repeat urinalysis was ordered and it still showed 7-10     WBCs and few bacteria but it was also contaminated with many     squamous epithelial cells.  Her urine cultures did not grow any     bacteria.  She was maintained on IV antibiotics.  She defervesced     in the hospital and has remained afebrile over the past 48 hours.     Her nausea has also improved and she is no longer dry heaving.  We  will discharge on oral antibiotics and she is to follow up with her     primary care physician. 2. Elevated liver function tests.  A hepatic panel done upon admission     showed that her LFTs were elevated, total bilirubin of 2.4 with a     direct bilirubin of 1.6, indirect bilirubin of 0.8, alkaline     phosphatase of 120, SGOT of 65, SGPT of 65.  An acute hepatitis     panel was ordered and the results are pending at the time of this     dictation.  A right upper quadrant ultrasound was also done and the     results are as stated above.  The patient indicated that she had     been rotating Motrin and Tylenol outpatient for fevers in the days     prior to the admission and this could be a contributing factor.     Her symptoms are improved at this time and she was requesting to be     discharged and I think this is reasonable.  She is to follow up     with her primary care physician Dr.  Amador Cunas for the results of     her hepatitis panel and she is to have recheck liver function tests     in 2 to 3 weeks and further evaluation/management as appropriate     pending those.  I have discontinued her Motrin and decreased her     p.r.n. Tylenol to q.8 hours. 3. Status post gastric band.  General Surgery/Dr. Ezzard Standing was consulted     and saw the patient in the hospital and he removed 4 cc of fluid     from the band.  Following this, the symptoms that the patient was     having improved and Dr. Ezzard Standing recommended to advance her diet as     tolerated.  She is to follow up with him at the office in 4 weeks. 4. Persistent nausea/vomiting, above contributing factors.  Improved     at this time.  She is to continue Reglan p.r.n. and follow up with     her PCP and if symptoms recur, a hepatobiliary scan is recommended     for further evaluation. 5. History of asthma.  Remained stable during this hospital stay.  She     is to continue p.r.n. bronchodilators.  DISCHARGE MEDICATIONS: 1. Reglan 5 mg p.o. q.a.c. and q.h.s. p.r.n. 2. Oxycodone 5 mg q.4 hours p.r.n. 3. Tylenol 650 mg one q.8 hours p.r.n. 4. Calcium over-the-counter two tablets daily. 5. Cipro 500 mg one p.o. b.i.d. as previously. 6. Claritin 10 mg p.o. daily p.r.n. 7. Multivitamins one p.o. daily. 8. Pepcid one p.o. daily.     Kela Millin, M.D.     ACV/MEDQ  D:  01/29/2011  T:  01/29/2011  Job:  [id number]  Electronically Signed by Donnalee Curry M.D. on 01/31/2011 10:47:02 AM

## 2011-01-31 NOTE — Discharge Summary (Signed)
  NAME:  Olivia [name], Olivia [name]             ACCOUNT NO.:  [id number]  MEDICAL RECORD NO.:  [id number]           PATIENT TYPE:  I  LOCATION:  1507                         FACILITY:  [facility]  PHYSICIAN:  Kela Millin, M.D.DATE OF BIRTH:  30-Nov-1965  DATE OF ADMISSION:  01/27/2011 DATE OF DISCHARGE:                        DISCHARGE SUMMARY - REFERRING   ADDENDUM: Pepcid as previously and that just completes the medication list.  FOLLOWUP CARE: 1. Dr. Gordy Savers in 2 weeks, the patient is to have a CMET     upon followup and to follow up on the acute hepatitis panel 2. [place] [facility]/Dr. Ezzard Standing in 4 weeks.  DISCHARGE CONDITION:  Improved/stable.     Kela Millin, M.D.     ACV/MEDQ  D:  01/29/2011  T:  01/29/2011  Job:  [id number]  cc:   Dr. Dorothea Ogle, MD [address] [place] [address]  Electronically Signed by Donnalee Curry M.D. on 01/31/2011 10:46:34 AM

## 2011-02-01 ENCOUNTER — Other Ambulatory Visit (HOSPITAL_COMMUNITY): Payer: Self-pay | Admitting: Gastroenterology

## 2011-02-01 ENCOUNTER — Ambulatory Visit (HOSPITAL_COMMUNITY)
Admission: RE | Admit: 2011-02-01 | Discharge: 2011-02-01 | Disposition: A | Discharge status: Home / Self Care | Payer: 59 | Source: Ambulatory Visit | Attending: Gastroenterology | Admitting: Gastroenterology

## 2011-02-01 ENCOUNTER — Telehealth: Payer: Self-pay | Admitting: *Deleted

## 2011-02-01 DIAGNOSIS — R51 Headache: Secondary | ICD-10-CM | POA: Insufficient documentation

## 2011-02-01 DIAGNOSIS — R945 Abnormal results of liver function studies: Secondary | ICD-10-CM

## 2011-02-01 DIAGNOSIS — R161 Splenomegaly, not elsewhere classified: Secondary | ICD-10-CM | POA: Insufficient documentation

## 2011-02-01 DIAGNOSIS — R110 Nausea: Secondary | ICD-10-CM | POA: Insufficient documentation

## 2011-02-01 MED ORDER — GADOBENATE DIMEGLUMINE 529 MG/ML IV SOLN
20.0000 mL | Freq: Once | INTRAVENOUS | Status: AC
  Administered 2011-02-01: 20 mL via INTRAVENOUS

## 2011-02-01 NOTE — Telephone Encounter (Signed)
Pt called and reported rt leg is sore.  She was recently d/c from the hospital for an allergic reaction to PCN from a UTI she was being treated for.  Her leg is not red or warm to the touch.  She is somewhat concerned about a DVT however she did get 2 lovenox shots while in the hospital.  She said the soreness started last night and she feels it could be a pulled muscle.

## 2011-02-01 NOTE — Telephone Encounter (Signed)
Pt will call back to make appt if she needs to

## 2011-02-01 NOTE — Telephone Encounter (Signed)
ROV if patient desires

## 2011-02-16 NOTE — Consult Note (Signed)
  NAMEBEVA, REMUND             ACCOUNT NO.:  [id number]  MEDICAL RECORD NO.:  [id number]           PATIENT TYPE:  I  LOCATION:  1507                         FACILITY:  [facility]  PHYSICIAN:  Sandria Bales. Ezzard Standing, M.D.  DATE OF BIRTH:  [birth year]  DATE OF CONSULTATION: 28 January 2011                                CONSULTATION   REASON FOR CONSULTATION:  Nausea and vomiting.  REFERRING PHYSICIAN:  Isla Pence, M.D.  HISTORY OF PRESENT ILLNESS:  This is a 45 year old white female, who is a patient of Dr. Eleonore Chiquito, who had a lap band placed by Dr. Baruch Merl on August 08, 2008.  She has struggled with reflux, probably in part because her band was too tight.  I did an upper endoscopy on her on December 04, 2010, which showed the band in good position with no esophageal or gastric abnormality.  She was admitted by [facility] on March 25 for a possibly urinary tract infection, volume depletion, hypokalemia, and gastroesophageal reflux.  She has had persistent nausea and vomiting.  They did a CT scan yesterday, which I reviewed.  This shows the band in good position without evidence of slippage of the band.  PAST MEDICAL HISTORY: 1. She has a history of asthma. 2. She has history scoliosis. 3. She has history of polycystic ovary syndrome.  Her husband is in the room when I examined her.  PHYSICAL EXAMINATION:  VITAL SIGNS:  Her temperature is 99.8, pulse 67, respirations 20, blood pressure 117/75. NECK:  Supple. LUNGS:  Clear to auscultation with symmetric breath sounds. HEART:  Regular rate and rhythm without murmur or rub. ABDOMEN:  Soft.  Her port is easily felt in the right upper quadrant. She has no tenderness, no guarding, no rebound, no hernia that I could feel, though she is still somewhat heavy.  LABORATORY DATA:  Labs that I have, white blood count 4900, hemoglobin 12, hematocrit 37.  She does have 86% neutrophils.  She had a sodium 132,  potassium 3.3, chloride of 105, glucose of 150, bilirubin 2.4, alk phos of 120, SGOT of 65, SGPT of 65.  IMPRESSION: 1. Lap band, questionable either edema or band is too tight.  I at the     bedside accessed the band, removed 4 cc of fluid and she had      relief instantaneously.    I think her diet can be advanced as tolerated.  We will follow her while she is in the hospital. 2. Questionable urinary tract infection. 3. Hypokalemia. 4. History of asthma. 5. Scoliosis. 6. History of polycystic ovary syndrome.   Sandria Bales. Ezzard Standing, M.D., FACS   DHN/MEDQ  D:  01/28/2011  T:  01/28/2011  Job:  [id number]  Electronically Signed by Ovidio Kin M.D. on 02/16/2011 11:58:51 AM

## 2011-03-19 NOTE — Op Note (Signed)
[name], Olivia [name]             ACCOUNT NO.:  [id number]   MEDICAL RECORD NO.:  [id number]          PATIENT TYPE:  AMB   LOCATION:  DAY                          FACILITY:  [facility]   PHYSICIAN:  Alfonse Ras, MD   DATE OF BIRTH:  1966-10-20   DATE OF PROCEDURE:  08/08/2008  DATE OF DISCHARGE:                               OPERATIVE REPORT   PREOPERATIVE DIAGNOSIS:  Medically refractory morbid obesity with BMI of  51.   POSTOPERATIVE DIAGNOSIS:  Medically refractory morbid obesity with BMI  of 51.   PROCEDURE:  Laparoscopic adjustable gastric banding with the APS  Allergan system and a subcutaneous port.   SURGEON:  Alfonse Ras, MD   ASSISTANT:  Velora Heckler, MD.   ANESTHESIA:  General anesthesia endotracheal tube.   DESCRIPTION:  The patient was taken to the operating room, placed in a  supine position.  After adequate general anesthesia was induced using  endotracheal tube, the abdomen was prepped and draped in the normal  sterile fashion.  Using an 11 mm Optiview trocar in the left upper  quadrant, peritoneal access was obtained under direct vision.  Pneumoperitoneum was obtained.  A 15 mm trocar was placed through the  falciform ligament in the right upper quadrant, an additional 11 mm  trocar was placed in the right upper quadrant, 11 mm trocar was placed  in the supraumbilical right paramedian position.  Nathanson liver  retractor was introduced in the subxiphoid region, and a left lateral  segment and the liver was retracted anteriorly.  Additional 5-mm trocar  was placed in the left abdomen.  The angle of His was identified and  sharply dissected with cautery, and then the blunt band passer.  Using a  pars flaccida technique, the band passer was placed in a retrogastric  position.  The sizing balloon was placed down, and inflated.  There was  no evidence of hiatal hernia.  The tube was then pulled back into the  esophagus.  The APS band was then introduced  through 15 mm port, and  placed in the retrogastric position.  It was snapped in place.  This was  accomplished over the sizing tube.  Anterior fundoplication was  performed with interrupted 2-0 Ethibond sutures in the standard fashion.  Anti-slip stitch was placed as well.  Adequate hemostasis was ensured.  The [facility] liver retractor was removed.  The tubing was removed and  attached to the port.  A pocket was created on Scarpa's fascia lateral  to the 11 mm port in the right upper quadrant, and the port was placed  in this pocket.  It was sutured in place with interrupted 2-0 Vicryl  sutures.  Skin incisions were closed with subcuticular 4-0 Vicryl.  All  incisions were injected with 0.5 Marcaine.  Steri-Strips and sterile  dressings were applied.  The patient tolerated the procedure well, and  went to PACU in good condition.      Alfonse Ras, MD  Electronically Signed     KRE/MEDQ  D:  08/08/2008  T:  08/08/2008  Job:  [id number]

## 2011-03-22 NOTE — H&P (Signed)
NAMEIDELL, HISSONG NO.:  [id number]   MEDICAL RECORD NO.:  [id number]           PATIENT TYPE:   LOCATION:                                 FACILITY:   PHYSICIAN:  Richardean Sale, M.D.   DATE OF BIRTH:  27-Jul-1966   DATE OF ADMISSION:  DATE OF DISCHARGE:                                HISTORY & PHYSICAL   DIAGNOSIS:  Missed abortion.   HISTORY OF PRESENT ILLNESS:  This is a 45 year old, gravida 2, para 1, white  female who presented for new OB care on July 10, 2005, and was found to  have an intrauterine pregnancy at [redacted] weeks gestation with no cardiac  activity.  The pregnancy has been complicated by first trimester spotting.  The patient denies any heavy vaginal bleeding or cramping, and otherwise has  no complaints.   REVIEW OF SYSTEMS:  Denies chest pain, shortness of breath, significant  nausea, vomiting, diarrhea, fever, chills, or sweats.   PAST OBSTETRIC HISTORY:  Spontaneous vaginal delivery of a 10 pound baby 17  years ago.   GYNECOLOGIC HISTORY:  Remote history of abnormal Pap smear.  No sexually  transmitted infections.   PAST MEDICAL HISTORY:  1.  Polycystic ovarian syndrome, currently managed with metformin.  2.  Morbid obesity.  3.  Asthma.  4.  Frequent gallbladder attack.   PAST SURGICAL HISTORY:  Tonsillectomy.   FAMILY HISTORY:  Positive for hypertension, heart disease, diabetes, colon  cancer.  Denies any birth defects, congenital anomalies, cystic fibrosis,  Down syndrome, or spina bifida.   SOCIAL HISTORY:  She is married.  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  Denies tobacco, alcohol, or drugs.   MEDICATIONS:  1.  Metformin 1000 mg nightly.  2.  Prenatal vitamin daily.  3.  Albuterol inhaler p.r.n.   ALLERGIES:  PENICILLIN.   PHYSICAL EXAMINATION:  VITAL SIGNS:  Blood pressure is 122/70, heart rate  80s and regular, weight is 323 with a BMI of greater than 40.  GENERAL:  She is an obese white female  who is no acute distress.  HEART:  Regular rate and rhythm.  LUNGS:  Clear to auscultation bilaterally.  ABDOMEN:  Soft, nontender, nondistended, with no palpable masses.  Liver and  spleen are normal.  No hernia.  EXTREMITIES:  No clubbing, cyanosis, or edema.  NEUROLOGIC:  Nonfocal.  PELVIC:  External genitalia within normal limits.  Speculum exam - the  cervix is visibly normal without lesions.  On bimanual exam, the uterus is  difficult to palpate secondary to patient's habitus.  Cervix is closed.  Adnexa not palpable.   Ultrasound shows an intrauterine pregnancy at [redacted] weeks gestation with no  cardiac activity identified, and mild edema around the abdomen, consistent  with missed AB.   LABORATORY DATA:  Blood type is known at O positive.   ASSESSMENT:  A 45 year old, gravida 2, para 1-0-1-1, white female with first  trimester missed AB.   PLAN:  Will proceed with D&C for surgical management.  Reviewed alternative  of expectant management with patient.  Risks and benefits  have been reviewed  with the patient in detail.  We discussed the risks which include, but are  not limited to, hemorrhage requiring transfusion, infection, uterine  perforation which could require additional surgery to repair any injury to  the bowel or the bladder.  Reviewed risks of Asherman's syndrome, and  anesthetic complication.  The patient voiced understanding of these risks  and desires to proceed.  Informed consent has been obtained.      Richardean Sale, M.D.  Electronically Signed     JW/MEDQ  D:  07/10/2005  T:  07/10/2005  Job:  [id number]

## 2011-03-22 NOTE — Op Note (Signed)
NAMECHIEKO, NEISES             ACCOUNT NO.:  [id number]   MEDICAL RECORD NO.:  [id number]          PATIENT TYPE:  AMB   LOCATION:  SDC                           FACILITY:  [facility]   PHYSICIAN:  Richardean Sale, M.D.   DATE OF BIRTH:  May 30, 1966   DATE OF PROCEDURE:  07/12/2005  DATE OF DISCHARGE:                                 OPERATIVE REPORT   PREOPERATIVE DIAGNOSIS:  First trimester missed abortion.   POSTOPERATIVE DIAGNOSIS:  First trimester missed abortion.   PROCEDURE:  Suction D & E.   SURGEON:  Richardean Sale, M.D.   ASSISTANT:  None.   COMPLICATIONS:  None.   ESTIMATED BLOOD LOSS:  50 mL.   ANESTHESIA:  Conscious sedation with paracervical block.   FINDINGS:  Products of conception.   INDICATIONS:  This is a 45 year old, gravida 2, para 1-0-1-1 white female  who previously had a documented intrauterine pregnancy on a 7-week  ultrasound who presented for follow-up ultrasound at 9 weeks secondary to  first trimester spotting and was found to have a 9-week size fetal pole with  no cardiac activity identified. The patient presents today for suction D & E  for surgical management of missed AB.   Prior to the procedure, the risks, benefits, and alternatives of the  procedure were reviewed with the patient in detail. We discussed the risks  which include but are not limited to hemorrhage requiring transfusion,  infection, injury to the uterus such as a uterine perforation which could  require additional exploratory surgery through the abdomen to evaluate for  any injury to the bowel or the bladder. We also reviewed the risk of  Asherman syndrome secondary to scarring of the uterus and general anesthetic  related risks. The patient voiced an understanding of all these risks and  desires to proceed. Informed consent was obtained before proceeding to the  OR.   PROCEDURE:  The patient was taken to the operating room where she was given  conscious sedation. She was then  placed in the dorsal lithotomy position and  she was prepped and draped in the usual sterile fashion with Betadine. A red  rubber catheter was used to drain the bladder. Bimanual exam was performed  to reveal the uterus was midline, mobile, approximately 10 weeks size, no  obvious adnexal masses. A speculum was then placed in the vagina and the  cervix was visualized. 3 mL of 1% Nesacaine were then injected at the 12  o'clock position and a single-tooth tenaculum was then used to grasp the  anterior lip of the cervix. Paracervical block was then administered using a  total of 22 mL of 1%  Nesacaine. The cervix was then very gently dilated  with the Hegar dilators and the #8 suction curette was then introduced and  suction was applied. A moderate amount of products of conception were  removed. This was followed by sharp curettage until a gritty texture was  noted on all four quadrants. The suction was then reapplied to remove any  additional tissue and curettage revealed a clean uterine cavity. At this  point, the procedure was  terminated, the single-tooth tenaculum was removed.  The tenaculum sites were cauterized with silver nitrate and the speculum was  removed. Bimanual exam was performed, the uterus was now very small in the  midline and mobile. The patient tolerated the procedure very well with  minimal bleeding and without complication. She was taken to the recovery  room awake and in stable condition. All sponge, lap, needle and instrument  counts were correct x2.      Richardean Sale, M.D.  Electronically Signed     JW/MEDQ  D:  07/12/2005  T:  07/13/2005  Job:  [id number]

## 2011-03-22 NOTE — Op Note (Signed)
   [name], Olivia [name]                        ACCOUNT NO.:  [id number]   MEDICAL RECORD NO.:  [id number]                   PATIENT TYPE:   LOCATION:                                       FACILITY:   PHYSICIAN:  Anselmo Rod, M.D.               DATE OF BIRTH:  [birth year]   DATE OF PROCEDURE:  10/06/2002  DATE OF DISCHARGE:                                 OPERATIVE REPORT   PROCEDURE PERFORMED:  Colonoscopy.   ENDOSCOPIST:  Charna Elizabeth, M.D.   INSTRUMENT USED:  Olympus video colonoscope.   INDICATIONS FOR PROCEDURE:  Rectal bleeding in a 45 year old white female  with a history of malignant polyp in her mother.  Rule out colonic polyps,  masses, hemorrhoids, etc.   PREPROCEDURE PREPARATION:  Informed consent was procured from the patient.  The patient was fasted for eight hours prior to the procedure and prepped  with a Visicol tablets the night prior to the procedure.  The patient did  not consume her prep as advised.   PREPROCEDURE PHYSICAL:  The patient had stable vital signs.  Neck supple.  Chest clear to auscultation.  S1 and S2 regular.  Abdomen soft with normal  bowel sounds.   DESCRIPTION OF PROCEDURE:  The patient was placed in left lateral decubitus  position and sedated with 100 mg of Demerol and 10 mg of Versed  intravenously.  Once the patient was adequately sedated and maintained on  low flow oxygen and continuous cardiac monitoring, the Olympus video  colonoscope was advanced from the rectum to the cecum with difficulty.  There was a large amount of residual stool in the colon with Visicol still  seen in the right colon as well.  No large masses or polyps were seen but  small lesions could have been missed.  The patient tolerated the procedure  without complication.   IMPRESSION:  1. No large masses or polyps present.  2. Large amount of residual stool in the colon, a small lesion could have     been missed.   RECOMMENDATIONS:  1. A high fiber diet  has been recommended for the patient along with liberal     fluid intake.  2. If the patient has recurrent rectal bleeding she is to follow up at the     office for further recommendation at time.  3. Repeat colorectal cancer screening in the next five years unless the     patient has any problems in the interim.                                                   Anselmo Rod, M.D.   JNM/MEDQ  D:  10/07/2002  T:  10/08/2002  Job:  [id number]

## 2011-04-08 ENCOUNTER — Encounter (INDEPENDENT_AMBULATORY_CARE_PROVIDER_SITE_OTHER): Payer: Self-pay | Admitting: Surgery

## 2011-06-14 ENCOUNTER — Ambulatory Visit (INDEPENDENT_AMBULATORY_CARE_PROVIDER_SITE_OTHER): Payer: 59 | Admitting: Physician Assistant

## 2011-06-14 ENCOUNTER — Encounter (INDEPENDENT_AMBULATORY_CARE_PROVIDER_SITE_OTHER): Payer: Self-pay | Admitting: Physician Assistant

## 2011-06-14 VITALS — Ht 68.0 in | Wt 251.6 lb

## 2011-06-14 DIAGNOSIS — R1110 Vomiting, unspecified: Secondary | ICD-10-CM

## 2011-06-14 DIAGNOSIS — Z4651 Encounter for fitting and adjustment of gastric lap band: Secondary | ICD-10-CM

## 2011-06-14 NOTE — Progress Notes (Signed)
  HISTORY: Olivia [name] is a 45 y.o.female who received an AP-Standard lap-band in January 2010 by Dr. Daphine Deutscher. She comes in today with symptoms of regurgitation and intolerance of solid foods for the past few weeks.  VITAL SIGNS: There were no vitals filed for this visit.  PHYSICAL EXAM: Physical exam reveals a very well-appearing 45 y.o.female in no apparent distress Neurologic: Awake, alert, oriented Psych: Bright affect, conversant Respiratory: Breathing even and unlabored. No stridor or wheezing Abdomen: Soft, nontender, nondistended to palpation. Incisions well-healed. No incisional hernias. Port easily palpated. Extremities: Atraumatic, good range of motion.  ASSESMENT: 45 y.o.  female  s/p AP-Standard lap-band.   PLAN: We removed 0.5 mL fluid today. We'll have her return PRN

## 2011-06-14 NOTE — Patient Instructions (Signed)
Take PPI for two weeks then discontinue. Return PRN

## 2011-08-01 LAB — POCT RAPID STREP A: Streptococcus, Group A Screen (Direct): NEGATIVE

## 2011-08-06 LAB — DIFFERENTIAL
Eosinophils Absolute: 0.1
Eosinophils Relative: 1
Lymphocytes Relative: 24
Lymphs Abs: 1.5
Monocytes Absolute: 0.4
Monocytes Relative: 7

## 2011-08-06 LAB — CBC
MCHC: 33.4
MCV: 83.3
Platelets: 242
RBC: 5.31 — ABNORMAL HIGH
WBC: 6.5

## 2011-08-06 LAB — COMPREHENSIVE METABOLIC PANEL
ALT: 18
AST: 22
Albumin: 4
CO2: 22
Calcium: 9.1
Creatinine, Ser: 0.71
GFR calc Af Amer: >60
GFR calc non Af Amer: >60
Sodium: 136

## 2011-08-27 ENCOUNTER — Telehealth: Payer: Self-pay

## 2011-08-27 NOTE — Telephone Encounter (Signed)
Spoke with pt - asked about egg allergy - states we have filled this out before for her - she tried to re introduce eggs back into diet several years ago - had severe reaction.rash and SOB. Told her form will be ready in the AM

## 2011-09-09 ENCOUNTER — Other Ambulatory Visit (INDEPENDENT_AMBULATORY_CARE_PROVIDER_SITE_OTHER): Payer: 59

## 2011-09-09 DIAGNOSIS — Z0000 Encounter for general adult medical examination without abnormal findings: Secondary | ICD-10-CM

## 2011-09-09 LAB — LIPID PANEL
Cholesterol: 250 mg/dL — ABNORMAL HIGH (ref 0–200)
HDL: 73.4 mg/dL (ref >39.00)
Total CHOL/HDL Ratio: 3
Triglycerides: 115 mg/dL (ref 0.0–149.0)

## 2011-09-09 LAB — BASIC METABOLIC PANEL
BUN: 16 mg/dL (ref 6–23)
CO2: 24 mEq/L (ref 19–32)
Chloride: 110 mEq/L (ref 96–112)
Creatinine, Ser: 0.9 mg/dL (ref 0.4–1.2)
Glucose, Bld: 79 mg/dL (ref 70–99)
Potassium: 4.3 mEq/L (ref 3.5–5.1)

## 2011-09-09 LAB — CBC WITH DIFFERENTIAL/PLATELET
Eosinophils Absolute: 0.2 10*3/uL (ref 0.0–0.7)
Eosinophils Relative: 3.2 % (ref 0.0–5.0)
HCT: 37.2 % (ref 36.0–46.0)
Lymphs Abs: 2 10*3/uL (ref 0.7–4.0)
MCHC: 33.3 g/dL (ref 30.0–36.0)
MCV: 81 fl (ref 78.0–100.0)
Monocytes Absolute: 0.3 10*3/uL (ref 0.1–1.0)
Neutrophils Relative %: 65.6 % (ref 43.0–77.0)
Platelets: 242 10*3/uL (ref 150.0–400.0)
RDW: 15.3 % — ABNORMAL HIGH (ref 11.5–14.6)
WBC: 7.5 10*3/uL (ref 4.5–10.5)

## 2011-09-09 LAB — POCT URINALYSIS DIPSTICK
Bilirubin, UA: NEGATIVE
Blood, UA: NEGATIVE
Ketones, UA: NEGATIVE
pH, UA: 6

## 2011-09-09 LAB — HEPATIC FUNCTION PANEL
ALT: 12 U/L (ref 0–35)
Total Bilirubin: 0.5 mg/dL (ref 0.3–1.2)

## 2011-09-27 ENCOUNTER — Encounter: Payer: Self-pay | Admitting: Internal Medicine

## 2011-09-27 ENCOUNTER — Ambulatory Visit (INDEPENDENT_AMBULATORY_CARE_PROVIDER_SITE_OTHER): Payer: 59 | Admitting: Internal Medicine

## 2011-09-27 DIAGNOSIS — J45909 Unspecified asthma, uncomplicated: Secondary | ICD-10-CM

## 2011-09-27 DIAGNOSIS — E785 Hyperlipidemia, unspecified: Secondary | ICD-10-CM

## 2011-09-27 DIAGNOSIS — J309 Allergic rhinitis, unspecified: Secondary | ICD-10-CM

## 2011-09-27 DIAGNOSIS — E669 Obesity, unspecified: Secondary | ICD-10-CM

## 2011-09-27 MED ORDER — ALBUTEROL SULFATE HFA 108 (90 BASE) MCG/ACT IN AERS: 2.0000 | INHALATION_SPRAY | Freq: Four times a day (QID) | RESPIRATORY_TRACT | Status: DC | PRN

## 2011-09-27 NOTE — Patient Instructions (Signed)
It is important that you exercise regularly, at least 20 minutes 3 to 4 times per week.  If you develop chest pain or shortness of breath seek  medical attention.  You need to lose weight.  Consider a lower calorie diet and regular exercise.  Return in one year for follow-up   

## 2011-09-27 NOTE — Progress Notes (Signed)
Subjective:    Patient ID: Olivia [name], female    DOB: Nov 17, 1965, 45 y.o.   MRN: [id number]  HPI CC: cpx - doing well.  History of Present Illness:   45 year-old patient who is seen today for a health maintenance examination. She presently is participating in a NP program at [facility]. 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 She is doing quite well. She is followed by allergy for her asthma and allergic eyes. She has a history of mild dyslipidemia, and exogenous obesity. She was hospitalized in the spring for a sulfa associated allergic reaction with hepatitis. Allergies:   1) ! Penicillin G Sodium (Penicillin G Sodium)  2)  Sulfa   Past History:  Past Medical History:  Reviewed history from 04/15/2008 and no changes required.  Allergic rhinitis  Asthma  obesity  history migraine headaches  Hyperlipidemia borderline  gravida two, para two, abortus zero  polycystic ovarian syndrome   Past Surgical History:  Tonsillectomy 1979  Colonoscopy 2005  s/p lap band 10/09   Family History:  Reviewed history from 04/15/2008 and no changes required.  father age 44, hypertension, obesity  mother, age 48, hypertension, type 2 diabetes, obesity, colonic polyps  one sister, obesity, cardiac dysrhythmia   Social History:  Reviewed history from 04/15/2008 and no changes required.  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  HENT: Negative for hearing loss, ear pain, nosebleeds, congestion, sore throat, mouth sores, trouble swallowing, neck stiffness, dental problem, voice change, sinus pressure and tinnitus.   Eyes: Negative for photophobia, pain, redness and visual disturbance.  Respiratory: Negative for cough, chest tightness and shortness of breath.   Cardiovascular: Negative for chest pain, palpitations and leg swelling.    Gastrointestinal: Negative for nausea, vomiting, abdominal pain, diarrhea, constipation, blood in stool, abdominal distention and rectal pain.  Genitourinary: Negative for dysuria, urgency, frequency, hematuria, flank pain, vaginal bleeding, vaginal discharge, difficulty urinating, genital sores, vaginal pain, menstrual problem and pelvic pain.  Musculoskeletal: Negative for back pain and arthralgias.  Skin: Negative for rash.  Neurological: Negative for dizziness, syncope, speech difficulty, weakness, light-headedness, numbness and headaches.  Hematological: Negative for adenopathy. Does not bruise/bleed easily.  Psychiatric/Behavioral: Negative for suicidal ideas, behavioral problems, self-injury, dysphoric mood and agitation. The patient is not nervous/anxious.        Objective:   Physical Exam  Constitutional: She is oriented to person, place, and time. She appears well-developed and well-nourished.  HENT:  Head: Normocephalic and atraumatic.  Right Ear: External ear normal.  Left Ear: External ear normal.  Mouth/Throat: Oropharynx is clear and moist.  Eyes: Conjunctivae and EOM are normal.  Neck: Normal range of motion. Neck supple. No JVD present. No thyromegaly present.  Cardiovascular: Normal rate, regular rhythm, normal heart sounds and intact distal pulses.   No murmur heard. Pulmonary/Chest: Effort normal and breath sounds normal. She has no wheezes. She has no rales.  Abdominal: Soft. Bowel sounds are normal. She exhibits no distension and no mass. There is no tenderness. There is no rebound and no guarding.  Genitourinary: Vagina normal.  Musculoskeletal: Normal range of motion. She exhibits no edema and no tenderness.  Neurological: She is alert and oriented to person, place, and time. She has normal reflexes. No cranial nerve deficit. She exhibits normal muscle tone. Coordination normal.  Skin: Skin is warm and  dry. No rash noted.  Psychiatric: She has a normal mood and  affect. Her behavior is normal.          Assessment & Plan:   Preventive health Allergic rhinitis/asthma. Seems well compensated although has had a difficult fall with increased albuterol use. We'll continue to observe and consider maintenance drugs if the worsening in the future Exogenous obesity weight loss encouraged Dyslipidemia. Exercise weight loss encouraged  Recheck one year

## 2011-12-13 ENCOUNTER — Encounter: Payer: Self-pay | Admitting: Family

## 2011-12-13 ENCOUNTER — Ambulatory Visit (INDEPENDENT_AMBULATORY_CARE_PROVIDER_SITE_OTHER): Payer: 59 | Admitting: Family

## 2011-12-13 VITALS — BP 120/86 | HR 98 | Temp 98.3°F | Wt 261.0 lb

## 2011-12-13 DIAGNOSIS — J209 Acute bronchitis, unspecified: Secondary | ICD-10-CM

## 2011-12-13 DIAGNOSIS — J019 Acute sinusitis, unspecified: Secondary | ICD-10-CM

## 2011-12-13 MED ORDER — AZITHROMYCIN 250 MG PO TABS: ORAL_TABLET | ORAL | Status: AC

## 2011-12-13 MED ORDER — PREDNISONE 20 MG PO TABS: ORAL_TABLET | ORAL | Status: AC

## 2011-12-13 MED ORDER — MOMETASONE FUROATE 50 MCG/ACT NA SUSP: 2.0000 | Freq: Every day | NASAL | Status: DC

## 2011-12-13 NOTE — Progress Notes (Signed)
Subjective:    Patient ID: Olivia [name], female    DOB: 06/27/1966, 46 y.o.   MRN: [id number]  HPI And 46 year old white female, nonsmoker, patient of Dr. Kirtland Bouchard and with complaints of cough, congestion, wheezing, sinus pressure and pain has been going on off and on since Christmas. She was started on Advair and prednisone that helped her symptoms temporarily. Now they have flared again. She has a past medical history of allergic rhinitis and has numerous allergies. She sees an allergy specialist for that. At this point, patient's symptoms are worsening. Denies any lightheadedness, dizziness, chest pain, palpitations, shortness of breath or edema.   Review of Systems  Constitutional: Positive for fatigue.  HENT: Positive for congestion, sore throat, sneezing, postnasal drip and sinus pressure.   Eyes: Negative.   Respiratory: Positive for cough and wheezing.   Cardiovascular: Negative.   Gastrointestinal: Negative.   Genitourinary: Negative.   Skin: Negative.   Neurological: Negative.   Hematological: Negative.    Past Medical History  Diagnosis Date  . Asthma   . Allergy     History   Social History  . Marital Status: Married    Spouse Name: N/A    Number of Children: N/A  . Years of Education: N/A   Occupational History  . Not on file.   Social History Main Topics  . Smoking status: Never Smoker   . Smokeless tobacco: Not on file  . Alcohol Use: No  . Drug Use: Not on file  . Sexually Active: Not on file   Other Topics Concern  . Not on file   Social History Narrative  . No narrative on file    Past Surgical History  Procedure Date  . Tonsillectomy   . Gastric bypass     lap band    Family History  Problem Relation Age of Onset  . Diabetes Mother   . Obesity Mother   . Colon polyps Mother   . Obesity Father   . Hypertension Father   . Obesity Sister     Allergies  Allergen Reactions  . Eggs Or Egg-Derived Products   . Penicillins   . Sulfa  Drugs Cross Reactors     Current Outpatient Prescriptions on File Prior to Visit  Medication Sig Dispense Refill  . albuterol (PROVENTIL HFA;VENTOLIN HFA) 108 (90 BASE) MCG/ACT inhaler Inhale 2 puffs into the lungs every 6 (six) hours as needed.  1 Inhaler  6  . famotidine (PEPCID) 10 MG tablet Take 10 mg by mouth as needed.        . Multiple Vitamin (MULTIVITAMIN) capsule Take 1 capsule by mouth daily.        Marland Kitchen etonogestrel-ethinyl estradiol (NUVARING) 0.12-0.015 MG/24HR vaginal ring Place 1 each vaginally every 28 (twenty-eight) days. Insert vaginally and leave in place for 3 consecutive weeks, then remove for 1 week.         BP 120/86  Pulse 98  Temp(Src) 98.3 F (36.8 C) (Oral)  Wt 261 lb (118.389 kg)  SpO2 98%chart    Objective:   Physical Exam  Constitutional: She is oriented to person, place, and time. She appears well-developed and well-nourished.  HENT:  Right Ear: External ear normal.  Left Ear: External ear normal.  Nose: Nose normal.  Mouth/Throat: Oropharynx is clear and moist.       Frontal and maxillary sinus tenderness noted to palpation.  Neck: Normal range of motion. Neck supple.  Cardiovascular: Normal rate, regular rhythm and normal heart sounds.  Pulmonary/Chest: Effort normal. She has wheezes.       Bilateral expiratory wheezing noted throughout  Neurological: She is alert and oriented to person, place, and time.  Skin: Skin is warm and dry.  Psychiatric: She has a normal mood and affect.          Assessment & Plan:  Assessment: Acute sinusitis, acute bronchitis, cough  Plan: Prednisone 60x3, 40x3, 20x3. Nasonex 2 sprays in each nostril once daily. Z-Pak as directed. Encouraged her to resume her antihistamine daily. Call the office if symptoms worsen or persist we'll recheck a schedule and when necessary.

## 2011-12-13 NOTE — Patient Instructions (Signed)

## 2011-12-19 ENCOUNTER — Emergency Department (HOSPITAL_COMMUNITY)
Admission: EM | Admit: 2011-12-19 | Discharge: 2011-12-20 | Disposition: A | Discharge status: Home / Self Care | Payer: 59 | Attending: Emergency Medicine | Admitting: Emergency Medicine

## 2011-12-19 ENCOUNTER — Other Ambulatory Visit: Payer: Self-pay

## 2011-12-19 ENCOUNTER — Encounter (HOSPITAL_COMMUNITY): Payer: Self-pay | Admitting: *Deleted

## 2011-12-19 DIAGNOSIS — R079 Chest pain, unspecified: Secondary | ICD-10-CM | POA: Insufficient documentation

## 2011-12-19 DIAGNOSIS — R002 Palpitations: Secondary | ICD-10-CM | POA: Insufficient documentation

## 2011-12-19 DIAGNOSIS — Z79899 Other long term (current) drug therapy: Secondary | ICD-10-CM | POA: Insufficient documentation

## 2011-12-19 NOTE — ED Notes (Signed)
The pt has had some lt chest pain and lt arm pain for the past 3 days with an irregular heart beat  And feeling weak. Some sob

## 2011-12-20 ENCOUNTER — Other Ambulatory Visit: Payer: Self-pay

## 2011-12-20 LAB — COMPREHENSIVE METABOLIC PANEL
ALT: 13 U/L (ref 0–35)
AST: 14 U/L (ref 0–37)
Albumin: 3.4 g/dL — ABNORMAL LOW (ref 3.5–5.2)
CO2: 23 mEq/L (ref 19–32)
Calcium: 9.7 mg/dL (ref 8.4–10.5)
Chloride: 101 mEq/L (ref 96–112)
GFR calc non Af Amer: >90 mL/min (ref >90)
Sodium: 135 mEq/L (ref 135–145)
Total Bilirubin: 0.3 mg/dL (ref 0.3–1.2)

## 2011-12-20 LAB — DIFFERENTIAL
Band Neutrophils: 0 % (ref 0–10)
Basophils Absolute: 0 10*3/uL (ref 0.0–0.1)
Basophils Relative: 0 % (ref 0–1)
Eosinophils Absolute: 0 10*3/uL (ref 0.0–0.7)
Myelocytes: 0 %
Promyelocytes Absolute: 0 %

## 2011-12-20 LAB — CBC
HCT: 36.2 % (ref 36.0–46.0)
Hemoglobin: 12.2 g/dL (ref 12.0–15.0)
MCH: 26.1 pg (ref 26.0–34.0)
MCHC: 33.7 g/dL (ref 30.0–36.0)
MCV: 77.5 fL — ABNORMAL LOW (ref 78.0–100.0)

## 2011-12-20 LAB — CARDIAC PANEL(CRET KIN+CKTOT+MB+TROPI)
Relative Index: INVALID (ref 0.0–2.5)
Total CK: 45 U/L (ref 7–177)

## 2011-12-20 LAB — TSH: TSH: 0.792 u[IU]/mL (ref 0.350–4.500)

## 2011-12-20 LAB — POCT I-STAT TROPONIN I

## 2011-12-20 LAB — CK TOTAL AND CKMB (NOT AT ARMC): Relative Index: INVALID (ref 0.0–2.5)

## 2011-12-20 NOTE — ED Provider Notes (Signed)
Medical screening examination/treatment/procedure(s) were performed by non-physician practitioner and as supervising physician I was immediately available for consultation/collaboration.   Nat Christen, MD 12/20/11 [phone]

## 2011-12-20 NOTE — ED Provider Notes (Signed)
Patient with brief palpitations earlier that have resolved.  She is a repeat troponin that is negative and is now safe for   discharge home.  Medical screening examination/treatment/procedure(s) were performed by non-physician practitioner and as supervising physician I was immediately available for consultation/collaboration.   Nat Christen, MD 12/20/11 [phone]

## 2011-12-20 NOTE — ED Provider Notes (Signed)
History     CSN: [id number]  Arrival date & time 12/19/11  2327   First MD Initiated Contact with Patient 12/20/11 0023      Chief Complaint  Patient presents with  . Tachycardia    (Consider location/radiation/quality/duration/timing/severity/associated sxs/prior treatment) HPI Comments: Tonight the patient took an Excedrin with caffeine and, therefore, headache, which is abnormal for her on top of all of the other, medication.  She's been taking for the past 3 weeks for bronchitis and shortly thereafter developed an irregular heart rate with long pauses that frightened her, when she had a hot sensation in her left arm.  She has never had this experience before has no cardiac history, but she is very sensitive to medications.  She normally does not drink any caffeinated beverages. This irregular heart beat is not associated with nausea, diaphoresis, shortness of breath  The history is provided by the patient.    Past Medical History  Diagnosis Date  . Asthma   . Allergy     Past Surgical History  Procedure Date  . Tonsillectomy   . Gastric bypass     lap band    Family History  Problem Relation Age of Onset  . Diabetes Mother   . Obesity Mother   . Colon polyps Mother   . Obesity Father   . Hypertension Father   . Obesity Sister     History  Substance Use Topics  . Smoking status: Never Smoker   . Smokeless tobacco: Not on file  . Alcohol Use: No    OB History    Grav Para Term Preterm Abortions TAB SAB Ect Mult Living                  Review of Systems  Constitutional: Negative for fever.  HENT: Negative for congestion.   Respiratory: Positive for chest tightness. Negative for shortness of breath.   Cardiovascular: Positive for palpitations. Negative for chest pain and leg swelling.  Gastrointestinal: Negative for nausea and vomiting.  Genitourinary: Negative for dysuria.  Neurological: Negative for dizziness and weakness.    Allergies    Cephalosporins; Eggs or egg-derived products; Penicillins; and Sulfa drugs cross reactors  Home Medications   Current Outpatient Rx  Name Route Sig Dispense Refill  . ALBUTEROL SULFATE HFA 108 (90 BASE) MCG/ACT IN AERS Inhalation Inhale 2 puffs into the lungs every 6 (six) hours as needed. For shortness of breath    . ASPIRIN-ACETAMINOPHEN-CAFFEINE 250-250-65 MG PO TABS Oral Take 1 tablet by mouth every 6 (six) hours as needed. For headache pain    . CETIRIZINE HCL 10 MG PO TABS Oral Take 10 mg by mouth daily.    Marland Kitchen FAMOTIDINE 10 MG PO TABS Oral Take 10 mg by mouth daily.     Marland Kitchen FLUTICASONE-SALMETEROL 250-50 MCG/DOSE IN AEPB Inhalation Inhale 1 puff into the lungs daily.     . ADULT MULTIVITAMIN W/MINERALS CH Oral Take 1 tablet by mouth daily.    Marland Kitchen PREDNISONE 20 MG PO TABS  60mg  PO qam x 3 days, 40mg  po qam x 3 days, 20mg  qam x 3 days 18 tablet 0  . MOMETASONE FUROATE 50 MCG/ACT NA SUSP Nasal Place 2 sprays into the nose daily. 17 g 12    BP 126/77  Pulse 69  Temp(Src) 97.6 F (36.4 C) (Oral)  Resp 17  SpO2 98%  LMP 12/12/2011  Physical Exam  Constitutional: She is oriented to person, place, and time. She appears well-developed and well-nourished.  HENT:  Head: Normocephalic.  Eyes: Pupils are equal, round, and reactive to light.  Neck: Normal range of motion.  Cardiovascular: Normal rate.  An irregular rhythm present. PMI is not displaced.   Pulmonary/Chest: Effort normal and breath sounds normal. No respiratory distress. She has no wheezes.  Abdominal: Soft. She exhibits no distension.  Musculoskeletal: Normal range of motion.  Neurological: She is alert and oriented to person, place, and time.  Skin: Skin is warm.    ED Course  Procedures (including critical care time)  Labs Reviewed  CBC - Abnormal; Notable for the following:    WBC 12.3 (*)    MCV 77.5 (*)    All other components within normal limits  COMPREHENSIVE METABOLIC PANEL - Abnormal; Notable for the  following:    Glucose, Bld 156 (*)    Albumin 3.4 (*)    All other components within normal limits  DIFFERENTIAL  CK TOTAL AND CKMB  POCT I-STAT TROPONIN I   No results found.   No diagnosis found.   ED ECG REPORT   Date: 12/20/2011  EKG Time: 1:13 AM  Rate: 77  Rhythm: sinus arrhythmia,  PAC's noted  Axis:normal  Intervals:none  ST&T Change: none  Narrative Interpretation: sinus arrythmia   Reviewed.  Initial labs and EKG with patient and significant other.  They're agreeable to have second set of markers done at approximately 2:30 and reevaluate at that time        MDM  Palpitations.  We'll follow cardiac markers, electrolytes, EKG        Arman Filter, NP 12/20/11 0106  Arman Filter, NP 12/20/11 0114  Arman Filter, NP 12/20/11 [phone]

## 2012-02-28 ENCOUNTER — Encounter (INDEPENDENT_AMBULATORY_CARE_PROVIDER_SITE_OTHER): Payer: Self-pay | Admitting: Surgery

## 2012-02-28 ENCOUNTER — Ambulatory Visit (INDEPENDENT_AMBULATORY_CARE_PROVIDER_SITE_OTHER): Payer: 59 | Admitting: Surgery

## 2012-02-28 VITALS — BP 132/78 | HR 68 | Temp 96.6°F | Resp 16 | Ht 68.0 in | Wt 263.2 lb

## 2012-02-28 DIAGNOSIS — K449 Diaphragmatic hernia without obstruction or gangrene: Secondary | ICD-10-CM

## 2012-02-28 DIAGNOSIS — Z9884 Bariatric surgery status: Secondary | ICD-10-CM

## 2012-02-28 DIAGNOSIS — K219 Gastro-esophageal reflux disease without esophagitis: Secondary | ICD-10-CM

## 2012-02-28 NOTE — Progress Notes (Signed)
Olivia [name] 46 y.o.  Body mass index is 40.02 kg/(m^2).  Patient Active Problem List  Diagnoses  . HYPERLIPIDEMIA  . OBESITY  . URI  . ALLERGIC RHINITIS  . ASTHMA    Allergies  Allergen Reactions  . Cephalosporins Anaphylaxis  . Eggs Or Egg-Derived Products   . Penicillins   . Sulfa Drugs Cross Reactors     Past Surgical History  Procedure Date  . Tonsillectomy   . Laparoscopic gastric banding 08/08/08   Olivia Boga, MD, MD No diagnosis found.  Olivia [name] is frustrated by what appears to be GERD that is nocturnal and heartburn.  She says that she thinks she has a hiatus hernia.  We need to get a UGI and then plan on going back to the OR and repairing her HH.   Will see her back after her UGI.  Olivia B. Daphine Deutscher, MD, [facility] [phone] beeper [phone]  02/28/2012 3:44 PM

## 2012-03-04 ENCOUNTER — Ambulatory Visit (HOSPITAL_COMMUNITY)
Admission: RE | Admit: 2012-03-04 | Discharge: 2012-03-04 | Disposition: A | Discharge status: Home / Self Care | Payer: 59 | Source: Ambulatory Visit | Attending: Surgery | Admitting: Surgery

## 2012-03-04 DIAGNOSIS — Z9884 Bariatric surgery status: Secondary | ICD-10-CM | POA: Insufficient documentation

## 2012-03-04 DIAGNOSIS — K449 Diaphragmatic hernia without obstruction or gangrene: Secondary | ICD-10-CM | POA: Insufficient documentation

## 2012-03-04 DIAGNOSIS — K219 Gastro-esophageal reflux disease without esophagitis: Secondary | ICD-10-CM | POA: Insufficient documentation

## 2012-03-04 LAB — PREGNANCY, URINE: Preg Test, Ur: NEGATIVE

## 2012-03-05 ENCOUNTER — Telehealth (INDEPENDENT_AMBULATORY_CARE_PROVIDER_SITE_OTHER): Payer: Self-pay | Admitting: Surgery

## 2012-03-09 ENCOUNTER — Encounter (INDEPENDENT_AMBULATORY_CARE_PROVIDER_SITE_OTHER): Payer: Self-pay | Admitting: Surgery

## 2012-03-12 ENCOUNTER — Encounter (INDEPENDENT_AMBULATORY_CARE_PROVIDER_SITE_OTHER): Payer: Self-pay | Admitting: Surgery

## 2012-03-12 ENCOUNTER — Ambulatory Visit (INDEPENDENT_AMBULATORY_CARE_PROVIDER_SITE_OTHER): Payer: Commercial Managed Care - PPO | Admitting: Surgery

## 2012-03-12 VITALS — BP 142/88 | HR 76 | Temp 97.9°F | Resp 16 | Ht 68.0 in | Wt 265.8 lb

## 2012-03-12 DIAGNOSIS — K219 Gastro-esophageal reflux disease without esophagitis: Secondary | ICD-10-CM | POA: Insufficient documentation

## 2012-03-12 NOTE — Progress Notes (Signed)
Trinh came in today and I reviewed her upper GI which showed free reflux and a hiatal hernia. This is been documented with her upper GI as well as endoscopy. I showed her the hiatal hernia repair with lapband poster that was presented at [facility] and we discussed trying to go in and do a posterior repair. She is been frustrated but really wants to preserve her band.  I described repair including a posterior dissection and hopefully hiatal hernia approximation posteriorly. We may need to resite her band after repairing the hiatal hernia. She was to go and get this scheduled.  Past medical history she denies any history of DVT. She's had some issues with her and hoarseness.  HEENT exam unremarkable neck is supple chest clear to auscultation. Heart sinus rhythm with a almost bradycardia. No murmurs. Abdomen nontender. Port seems to be subcutaneous.  Extremities exam shows no tenderness. No history of DVT. Neuro alert and oriented x3. Margins are function grossly intact.  She understands the risk and benefits of the procedure. We'll plan posterior hilar hernia repair possibly with re\re siting of her band. We'll go ahead and schedule

## 2012-04-09 ENCOUNTER — Encounter (HOSPITAL_COMMUNITY): Payer: Self-pay | Admitting: Pharmacy Technician

## 2012-04-13 ENCOUNTER — Encounter (HOSPITAL_COMMUNITY)
Admission: RE | Admit: 2012-04-13 | Discharge: 2012-04-13 | Disposition: A | Discharge status: Home / Self Care | Payer: 59 | Source: Ambulatory Visit | Attending: Surgery | Admitting: Surgery

## 2012-04-13 ENCOUNTER — Encounter (HOSPITAL_COMMUNITY): Payer: Self-pay

## 2012-04-13 ENCOUNTER — Ambulatory Visit (HOSPITAL_COMMUNITY)
Admission: RE | Admit: 2012-04-13 | Discharge: 2012-04-13 | Disposition: A | Discharge status: Home / Self Care | Payer: 59 | Source: Ambulatory Visit | Attending: Surgery | Admitting: Surgery

## 2012-04-13 ENCOUNTER — Other Ambulatory Visit (INDEPENDENT_AMBULATORY_CARE_PROVIDER_SITE_OTHER): Payer: Self-pay | Admitting: Surgery

## 2012-04-13 DIAGNOSIS — K469 Unspecified abdominal hernia without obstruction or gangrene: Secondary | ICD-10-CM | POA: Insufficient documentation

## 2012-04-13 DIAGNOSIS — Z01812 Encounter for preprocedural laboratory examination: Secondary | ICD-10-CM | POA: Insufficient documentation

## 2012-04-13 HISTORY — DX: Other specified postprocedural states: Z98.890

## 2012-04-13 HISTORY — DX: Other specified postprocedural states: R11.2

## 2012-04-13 LAB — CBC
Hemoglobin: 12 g/dL (ref 12.0–15.0)
MCH: 24.3 pg — ABNORMAL LOW (ref 26.0–34.0)
Platelets: 318 10*3/uL (ref 150–400)
RBC: 4.93 MIL/uL (ref 3.87–5.11)
WBC: 7 10*3/uL (ref 4.0–10.5)

## 2012-04-13 LAB — SURGICAL PCR SCREEN
MRSA, PCR: NEGATIVE
Staphylococcus aureus: POSITIVE — AB

## 2012-04-13 LAB — COMPREHENSIVE METABOLIC PANEL
ALT: 13 U/L (ref 0–35)
AST: 25 U/L (ref 0–37)
Albumin: 3.3 g/dL — ABNORMAL LOW (ref 3.5–5.2)
Alkaline Phosphatase: 51 U/L (ref 39–117)
CO2: 27 mEq/L (ref 19–32)
Chloride: 102 mEq/L (ref 96–112)
GFR calc non Af Amer: >90 mL/min (ref >90)
Potassium: 3.7 mEq/L (ref 3.5–5.1)
Sodium: 137 mEq/L (ref 135–145)
Total Bilirubin: 0.4 mg/dL (ref 0.3–1.2)

## 2012-04-13 LAB — DIFFERENTIAL
Lymphocytes Relative: 29 % (ref 12–46)
Lymphs Abs: 2 10*3/uL (ref 0.7–4.0)
Monocytes Relative: 8 % (ref 3–12)
Neutro Abs: 4 10*3/uL (ref 1.7–7.7)
Neutrophils Relative %: 59 % (ref 43–77)

## 2012-04-13 LAB — HCG, SERUM, QUALITATIVE: Preg, Serum: NEGATIVE

## 2012-04-13 NOTE — Pre-Procedure Instructions (Signed)
EKG 2/13 EPIC- states had "liver failure 2/13 per gastro from sulfa antibiotics"

## 2012-04-13 NOTE — Patient Instructions (Signed)
20 [name]  04/13/2012   Your procedure is scheduled on: 04/14/12   Tuesday  Surgery  0730-1030   Report to [facility] at 0515      AM.  Call this number if you have problems the morning of surgery: [phone]     Or PST   [id number]  [name]   Remember:              Olivia [name] WITH YOU TO HOSPITAL  Do not eat food  Or fluids :After Midnight. tonight   .  Take these medicines the morning of surgery with A SIP OF WATER: inhaler if needed   Do not wear jewelry, make-up or nail polish.  Do not wear lotions, powders, or perfumes. You may wear deodorant.  Do not shave 48 hours prior to surgery.  Do not bring valuables to the hospital.  Contacts, dentures or bridgework may not be worn into surgery.  Leave suitcase in the car. After surgery it may be brought to your room.  For patients admitted to the hospital, checkout time is 11:00 AM the day of discharge.   Patients discharged the day of surgery will not be allowed to drive home.  Name and phone number of your driver:    husband                                                                  Special Instructions: CHG Shower Use Special Wash: 1/2 bottle night before surgery and 1/2 bottle morning of surgery. REGULAR SOAP FACE AND PRIVATES              LADIES- NO SHAVING 48 HOURS BEFORE USING BETASEPT SOAP.                 Please read over the following fact sheets that you were given: MRSA Information

## 2012-04-14 ENCOUNTER — Encounter (HOSPITAL_COMMUNITY): Payer: Self-pay | Admitting: Surgery

## 2012-04-14 ENCOUNTER — Encounter (HOSPITAL_COMMUNITY)
Admission: RE | Disposition: A | Discharge status: Home / Self Care | Payer: Self-pay | Source: Ambulatory Visit | Attending: Surgery

## 2012-04-14 ENCOUNTER — Ambulatory Visit (HOSPITAL_COMMUNITY): Payer: 59 | Admitting: Certified Registered Nurse Anesthetist

## 2012-04-14 ENCOUNTER — Inpatient Hospital Stay (HOSPITAL_COMMUNITY)
Admission: RE | Admit: 2012-04-14 | Discharge: 2012-04-15 | DRG: 327 | Disposition: A | Discharge status: Home / Self Care | Payer: 59 | Source: Ambulatory Visit | Attending: Surgery | Admitting: Surgery

## 2012-04-14 ENCOUNTER — Encounter (HOSPITAL_COMMUNITY): Payer: Self-pay | Admitting: Certified Registered Nurse Anesthetist

## 2012-04-14 ENCOUNTER — Encounter (HOSPITAL_COMMUNITY): Payer: Self-pay | Admitting: *Deleted

## 2012-04-14 DIAGNOSIS — K2100 Gastro-esophageal reflux disease with esophagitis, without bleeding: Secondary | ICD-10-CM

## 2012-04-14 DIAGNOSIS — E785 Hyperlipidemia, unspecified: Secondary | ICD-10-CM | POA: Diagnosis present

## 2012-04-14 DIAGNOSIS — Z9884 Bariatric surgery status: Secondary | ICD-10-CM

## 2012-04-14 DIAGNOSIS — J45909 Unspecified asthma, uncomplicated: Secondary | ICD-10-CM | POA: Diagnosis present

## 2012-04-14 DIAGNOSIS — K219 Gastro-esophageal reflux disease without esophagitis: Secondary | ICD-10-CM | POA: Diagnosis present

## 2012-04-14 DIAGNOSIS — K449 Diaphragmatic hernia without obstruction or gangrene: Principal | ICD-10-CM | POA: Diagnosis present

## 2012-04-14 DIAGNOSIS — E669 Obesity, unspecified: Secondary | ICD-10-CM | POA: Diagnosis present

## 2012-04-14 DIAGNOSIS — Z4651 Encounter for fitting and adjustment of gastric lap band: Secondary | ICD-10-CM

## 2012-04-14 DIAGNOSIS — Z6841 Body Mass Index (BMI) 40.0 and over, adult: Secondary | ICD-10-CM

## 2012-04-14 DIAGNOSIS — Z01812 Encounter for preprocedural laboratory examination: Secondary | ICD-10-CM

## 2012-04-14 DIAGNOSIS — Z01818 Encounter for other preprocedural examination: Secondary | ICD-10-CM

## 2012-04-14 HISTORY — PX: HIATAL HERNIA REPAIR: SHX195

## 2012-04-14 LAB — CBC
MCH: 24 pg — ABNORMAL LOW (ref 26.0–34.0)
MCHC: 31.8 g/dL (ref 30.0–36.0)
Platelets: 314 10*3/uL (ref 150–400)
RBC: 4.84 MIL/uL (ref 3.87–5.11)
RDW: 15.1 % (ref 11.5–15.5)

## 2012-04-14 LAB — CREATININE, SERUM: Creatinine, Ser: 0.68 mg/dL (ref 0.50–1.10)

## 2012-04-14 SURGERY — REPAIR, HERNIA, HIATAL, LAPAROSCOPIC
Anesthesia: General | Site: Abdomen | Wound class: Clean

## 2012-04-14 MED ORDER — HEPARIN SODIUM (PORCINE) 5000 UNIT/ML IJ SOLN
5000.0000 [IU] | INTRAMUSCULAR | Status: AC
  Administered 2012-04-14: 5000 [IU] via SUBCUTANEOUS

## 2012-04-14 MED ORDER — HYDROMORPHONE HCL PF 1 MG/ML IJ SOLN
0.2500 mg | INTRAMUSCULAR | Status: DC | PRN
Start: 2012-04-14 — End: 2012-04-14

## 2012-04-14 MED ORDER — SCOPOLAMINE 1 MG/3DAYS TD PT72
MEDICATED_PATCH | TRANSDERMAL | Status: AC
  Filled 2012-04-14: qty 1

## 2012-04-14 MED ORDER — GLYCOPYRROLATE 0.2 MG/ML IJ SOLN
INTRAMUSCULAR | Status: DC | PRN
  Administered 2012-04-14: .8 mg via INTRAVENOUS

## 2012-04-14 MED ORDER — ACETAMINOPHEN 10 MG/ML IV SOLN
INTRAVENOUS | Status: DC | PRN
  Administered 2012-04-14: 1000 mg via INTRAVENOUS

## 2012-04-14 MED ORDER — ACETAMINOPHEN 10 MG/ML IV SOLN
INTRAVENOUS | Status: AC
  Filled 2012-04-14: qty 100

## 2012-04-14 MED ORDER — KETOROLAC TROMETHAMINE 30 MG/ML IJ SOLN
30.0000 mg | Freq: Four times a day (QID) | INTRAMUSCULAR | Status: DC
  Administered 2012-04-14: 30 mg via INTRAVENOUS
  Filled 2012-04-14 (×5): qty 1

## 2012-04-14 MED ORDER — ONDANSETRON HCL 4 MG/2ML IJ SOLN: 4.0000 mg | INTRAMUSCULAR | Status: DC | PRN

## 2012-04-14 MED ORDER — MIDAZOLAM HCL 5 MG/5ML IJ SOLN
INTRAMUSCULAR | Status: DC | PRN
  Administered 2012-04-14: 2 mg via INTRAVENOUS

## 2012-04-14 MED ORDER — ROCURONIUM BROMIDE 100 MG/10ML IV SOLN
INTRAVENOUS | Status: DC | PRN
  Administered 2012-04-14 (×2): 20 mg via INTRAVENOUS
  Administered 2012-04-14: 50 mg via INTRAVENOUS

## 2012-04-14 MED ORDER — LACTATED RINGERS IR SOLN
Status: DC | PRN
  Administered 2012-04-14: 3000 mL

## 2012-04-14 MED ORDER — HEPARIN SODIUM (PORCINE) 5000 UNIT/ML IJ SOLN
5000.0000 [IU] | Freq: Three times a day (TID) | INTRAMUSCULAR | Status: DC
  Administered 2012-04-14 – 2012-04-15 (×4): 5000 [IU] via SUBCUTANEOUS
  Filled 2012-04-14 (×6): qty 1

## 2012-04-14 MED ORDER — EPHEDRINE SULFATE 50 MG/ML IJ SOLN
INTRAMUSCULAR | Status: DC | PRN
  Administered 2012-04-14: 10 mg via INTRAVENOUS

## 2012-04-14 MED ORDER — FENTANYL CITRATE 0.05 MG/ML IJ SOLN
INTRAMUSCULAR | Status: DC | PRN
  Administered 2012-04-14 (×5): 50 ug via INTRAVENOUS

## 2012-04-14 MED ORDER — ETOMIDATE 2 MG/ML IV SOLN
INTRAVENOUS | Status: DC | PRN
  Administered 2012-04-14: 12 mg via INTRAVENOUS

## 2012-04-14 MED ORDER — UNJURY CHOCOLATE CLASSIC POWDER
2.0000 [oz_av] | Freq: Four times a day (QID) | ORAL | Status: DC
  Administered 2012-04-15 (×2): 2 [oz_av] via ORAL

## 2012-04-14 MED ORDER — ACETAMINOPHEN 160 MG/5ML PO SOLN: 650.0000 mg | ORAL | Status: DC | PRN

## 2012-04-14 MED ORDER — UNJURY VANILLA POWDER: 2.0000 [oz_av] | Freq: Four times a day (QID) | ORAL | Status: DC

## 2012-04-14 MED ORDER — UNJURY CHICKEN SOUP POWDER: 2.0000 [oz_av] | Freq: Four times a day (QID) | ORAL | Status: DC

## 2012-04-14 MED ORDER — LIDOCAINE HCL (CARDIAC) 20 MG/ML IV SOLN
INTRAVENOUS | Status: DC | PRN
  Administered 2012-04-14: 100 mg via INTRAVENOUS

## 2012-04-14 MED ORDER — KETOROLAC TROMETHAMINE 15 MG/ML IJ SOLN
15.0000 mg | Freq: Once | INTRAMUSCULAR | Status: AC
  Administered 2012-04-14: 15 mg via INTRAMUSCULAR

## 2012-04-14 MED ORDER — HEPARIN SODIUM (PORCINE) 5000 UNIT/ML IJ SOLN
INTRAMUSCULAR | Status: AC
  Filled 2012-04-14: qty 1

## 2012-04-14 MED ORDER — ONDANSETRON HCL 4 MG/2ML IJ SOLN
INTRAMUSCULAR | Status: DC | PRN
  Administered 2012-04-14: 4 mg via INTRAVENOUS

## 2012-04-14 MED ORDER — VANCOMYCIN HCL 1000 MG IV SOLR
1000.0000 mg | INTRAVENOUS | Status: DC | PRN
  Administered 2012-04-14: 1000 mg via INTRAVENOUS

## 2012-04-14 MED ORDER — DEXAMETHASONE SODIUM PHOSPHATE 10 MG/ML IJ SOLN
INTRAMUSCULAR | Status: DC | PRN
  Administered 2012-04-14: 10 mg via INTRAVENOUS

## 2012-04-14 MED ORDER — PROMETHAZINE HCL 25 MG/ML IJ SOLN
6.2500 mg | INTRAMUSCULAR | Status: AC | PRN
  Administered 2012-04-14 (×2): 6.25 mg via INTRAVENOUS

## 2012-04-14 MED ORDER — LACTATED RINGERS IV SOLN
INTRAVENOUS | Status: DC | PRN
  Administered 2012-04-14: 07:00:00 via INTRAVENOUS

## 2012-04-14 MED ORDER — OXYCODONE-ACETAMINOPHEN 5-325 MG/5ML PO SOLN
5.0000 mL | ORAL | Status: DC | PRN
  Administered 2012-04-15: 5 mL via ORAL
  Filled 2012-04-14: qty 5

## 2012-04-14 MED ORDER — KCL IN DEXTROSE-NACL 20-5-0.45 MEQ/L-%-% IV SOLN
INTRAVENOUS | Status: DC
  Administered 2012-04-14: 14:00:00 via INTRAVENOUS
  Administered 2012-04-15: 75 mL via INTRAVENOUS
  Filled 2012-04-14 (×3): qty 1000

## 2012-04-14 MED ORDER — BUPIVACAINE LIPOSOME 1.3 % IJ SUSP
20.0000 mL | Freq: Once | INTRAMUSCULAR | Status: AC
Start: 2012-04-14 — End: 2012-04-14
  Administered 2012-04-14: 20 mL
  Filled 2012-04-14: qty 20

## 2012-04-14 MED ORDER — SUCCINYLCHOLINE CHLORIDE 20 MG/ML IJ SOLN
INTRAMUSCULAR | Status: DC | PRN
  Administered 2012-04-14: 100 mg via INTRAVENOUS

## 2012-04-14 MED ORDER — NEOSTIGMINE METHYLSULFATE 1 MG/ML IJ SOLN
INTRAMUSCULAR | Status: DC | PRN
  Administered 2012-04-14: 5 mg via INTRAVENOUS

## 2012-04-14 MED ORDER — KETOROLAC TROMETHAMINE 15 MG/ML IJ SOLN
INTRAMUSCULAR | Status: AC
  Filled 2012-04-14: qty 1

## 2012-04-14 MED ORDER — PROMETHAZINE HCL 25 MG/ML IJ SOLN
INTRAMUSCULAR | Status: AC
  Filled 2012-04-14: qty 1

## 2012-04-14 MED ORDER — KETOROLAC TROMETHAMINE 30 MG/ML IJ SOLN: 30.0000 mg | Freq: Once | INTRAMUSCULAR | Status: DC

## 2012-04-14 MED ORDER — MORPHINE SULFATE 2 MG/ML IJ SOLN
2.0000 mg | INTRAMUSCULAR | Status: DC | PRN
  Administered 2012-04-14: 6 mg via INTRAVENOUS
  Administered 2012-04-14 (×2): 2 mg via INTRAVENOUS
  Administered 2012-04-14 (×3): 6 mg via INTRAVENOUS
  Administered 2012-04-15: 2 mg via INTRAVENOUS
  Filled 2012-04-14: qty 2
  Filled 2012-04-14: qty 1
  Filled 2012-04-14: qty 3
  Filled 2012-04-14: qty 1
  Filled 2012-04-14 (×2): qty 3
  Filled 2012-04-14: qty 2
  Filled 2012-04-14: qty 3

## 2012-04-14 MED ORDER — VANCOMYCIN HCL IN DEXTROSE 1-5 GM/200ML-% IV SOLN
INTRAVENOUS | Status: AC
  Filled 2012-04-14: qty 200

## 2012-04-14 MED ORDER — SCOPOLAMINE 1 MG/3DAYS TD PT72
MEDICATED_PATCH | TRANSDERMAL | Status: DC | PRN
  Administered 2012-04-14: 1 via TRANSDERMAL

## 2012-04-14 MED ORDER — SODIUM CHLORIDE 0.9 % IJ SOLN
INTRAMUSCULAR | Status: DC | PRN
  Administered 2012-04-14: 20 mL

## 2012-04-14 MED ORDER — ALBUTEROL SULFATE HFA 108 (90 BASE) MCG/ACT IN AERS: 2.0000 | INHALATION_SPRAY | Freq: Four times a day (QID) | RESPIRATORY_TRACT | Status: DC | PRN

## 2012-04-14 MED ORDER — SODIUM CHLORIDE 0.9 % IJ SOLN
INTRAMUSCULAR | Status: DC | PRN
  Administered 2012-04-14: 10 mL via INTRAVENOUS

## 2012-04-14 SURGICAL SUPPLY — 65 items
ACC PORT GSTRC BAND STD KT HI (Band) ×1 IMPLANT
ADH SKN CLS APL DERMABOND .7 (GAUZE/BANDAGES/DRESSINGS) ×1
APL SKNCLS STERI-STRIP NONHPOA (GAUZE/BANDAGES/DRESSINGS) ×1
APPLIER CLIP ROT 10 11.4 M/L (STAPLE)
APR CLP MED LRG 11.4X10 (STAPLE)
BENZOIN TINCTURE PRP APPL 2/3 (GAUZE/BANDAGES/DRESSINGS) ×2 IMPLANT
CABLE HIGH FREQUENCY MONO STRZ (ELECTRODE) IMPLANT
CANISTER SUCTION 2500CC (MISCELLANEOUS) ×2 IMPLANT
CLAMP ENDO BABCK 10MM (STAPLE) IMPLANT
CLIP APPLIE ROT 10 11.4 M/L (STAPLE) IMPLANT
CLOTH BEACON ORANGE TIMEOUT ST (SAFETY) ×2 IMPLANT
COVER SURGICAL LIGHT HANDLE (MISCELLANEOUS) ×2 IMPLANT
DECANTER SPIKE VIAL GLASS SM (MISCELLANEOUS) ×2 IMPLANT
DERMABOND ADVANCED (GAUZE/BANDAGES/DRESSINGS) ×1
DERMABOND ADVANCED .7 DNX12 (GAUZE/BANDAGES/DRESSINGS) IMPLANT
DEVICE SUT QUICK LOAD TK 5 (STAPLE) ×5 IMPLANT
DEVICE SUT TI-KNOT TK 5X26 (MISCELLANEOUS) ×1 IMPLANT
DEVICE SUTURE ENDOST 10MM (ENDOMECHANICALS) ×2 IMPLANT
DISSECTOR BLUNT TIP ENDO 5MM (MISCELLANEOUS) ×2 IMPLANT
DRAIN PENROSE 18X1/2 LTX STRL (DRAIN) ×2 IMPLANT
DRAPE LAPAROSCOPIC ABDOMINAL (DRAPES) ×2 IMPLANT
ELECT REM PT RETURN 9FT ADLT (ELECTROSURGICAL) ×2
ELECTRODE REM PT RTRN 9FT ADLT (ELECTROSURGICAL) ×1 IMPLANT
FILTER SMOKE EVAC LAPAROSHD (FILTER) IMPLANT
GLOVE BIOGEL M 8.0 STRL (GLOVE) ×2 IMPLANT
GLOVE BIOGEL PI IND STRL 7.0 (GLOVE) IMPLANT
GLOVE BIOGEL PI INDICATOR 7.0 (GLOVE)
GOWN STRL NON-REIN LRG LVL3 (GOWN DISPOSABLE) ×2 IMPLANT
GOWN STRL REIN XL XLG (GOWN DISPOSABLE) ×4 IMPLANT
GRASPER ENDO BABCOCK 10 (MISCELLANEOUS) IMPLANT
GRASPER ENDO BABCOCK 10MM (MISCELLANEOUS)
HAND ACTIVATED (MISCELLANEOUS) ×2 IMPLANT
KIT ACCESS PORT VG (Band) ×1 IMPLANT
KIT BASIN OR (CUSTOM PROCEDURE TRAY) ×2 IMPLANT
NS IRRIG 1000ML POUR BTL (IV SOLUTION) ×2 IMPLANT
PACK UNIVERSAL I (CUSTOM PROCEDURE TRAY) ×1 IMPLANT
PENCIL BUTTON HOLSTER BLD 10FT (ELECTRODE) IMPLANT
SCISSORS LAP 5X35 DISP (ENDOMECHANICALS) ×2 IMPLANT
SET IRRIG TUBING LAPAROSCOPIC (IRRIGATION / IRRIGATOR) ×2 IMPLANT
SLEEVE ADV FIXATION 5X100MM (TROCAR) IMPLANT
SLEEVE Z-THREAD 5X100MM (TROCAR) IMPLANT
SOLUTION ANTI FOG 6CC (MISCELLANEOUS) ×2 IMPLANT
STAPLER VISISTAT 35W (STAPLE) ×2 IMPLANT
STRIP CLOSURE SKIN 1/2X4 (GAUZE/BANDAGES/DRESSINGS) IMPLANT
SUT ETHIBOND 2 0 SH (SUTURE) ×6
SUT ETHIBOND 2 0 SH 36X2 (SUTURE) IMPLANT
SUT SILK 3 0 (SUTURE) ×2
SUT SILK 3-0 18XBRD TIE 12 (SUTURE) IMPLANT
SUT SURGIDAC NAB ES-9 0 48 120 (SUTURE) ×6 IMPLANT
SUT VIC AB 4-0 SH 18 (SUTURE) ×2 IMPLANT
SYR 30ML LL (SYRINGE) ×2 IMPLANT
SYR 5ML LL (SYRINGE) ×1 IMPLANT
TIP INNERVISION DETACH 40FR (MISCELLANEOUS) IMPLANT
TIP INNERVISION DETACH 50FR (MISCELLANEOUS) IMPLANT
TIP INNERVISION DETACH 56FR (MISCELLANEOUS) IMPLANT
TIPS INNERVISION DETACH 40FR (MISCELLANEOUS)
TRAY FOLEY CATH 14FRSI W/METER (CATHETERS) ×2 IMPLANT
TROCAR ADV FIXATION 11X100MM (TROCAR) IMPLANT
TROCAR ADV FIXATION 5X100MM (TROCAR) IMPLANT
TROCAR XCEL BLUNT TIP 100MML (ENDOMECHANICALS) IMPLANT
TROCAR XCEL NON-BLD 11X100MML (ENDOMECHANICALS) IMPLANT
TROCAR Z-THREAD FIOS 11X100 BL (TROCAR) ×2 IMPLANT
TROCAR Z-THREAD FIOS 5X100MM (TROCAR) ×6 IMPLANT
TROCAR Z-THREAD SLEEVE 11X100 (TROCAR) IMPLANT
TUBING FILTER THERMOFLATOR (ELECTROSURGICAL) ×2 IMPLANT

## 2012-04-14 NOTE — Anesthesia Preprocedure Evaluation (Signed)
Anesthesia Evaluation  Patient identified by MRN, date of birth, ID band Patient awake    Reviewed: Allergy & Precautions, H&P , NPO status , Patient's Chart, lab work & pertinent test results  History of Anesthesia Complications (+) PONV  Airway Mallampati: II TM Distance: >3 FB Neck ROM: Full    Dental No notable dental hx.    Pulmonary asthma ,  CXR: NAD breath sounds clear to auscultation  Pulmonary exam normal       Cardiovascular negative cardio ROS  + dysrhythmias Rhythm:Regular Rate:Normal  ECG: Normal   Neuro/Psych  Headaches,  Neuromuscular disease negative psych ROS   GI/Hepatic Neg liver ROS, hiatal hernia, GERD-  Medicated,  Endo/Other  Morbid obesity  Renal/GU negative Renal ROS  negative genitourinary   Musculoskeletal negative musculoskeletal ROS (+)   Abdominal   Peds negative pediatric ROS (+)  Hematology negative hematology ROS (+)   Anesthesia Other Findings   Reproductive/Obstetrics negative OB ROS                           Anesthesia Physical Anesthesia Plan  ASA: II  Anesthesia Plan: General   Post-op Pain Management:    Induction: Intravenous  Airway Management Planned:   Additional Equipment:   Intra-op Plan:   Post-operative Plan: Extubation in OR  Informed Consent: I have reviewed the patients History and Physical, chart, labs and discussed the procedure including the risks, benefits and alternatives for the proposed anesthesia with the patient or authorized representative who has indicated his/her understanding and acceptance.   Dental advisory given  Plan Discussed with: CRNA  Anesthesia Plan Comments:         Anesthesia Quick Evaluation

## 2012-04-14 NOTE — Progress Notes (Signed)
Patient is alert and oriented, vital signs are stable, pt complaining of right neck and shoulder discomfort MD Notified and K pad ordered for patient, patient has been up and ambulating around the nursing station several times this shift with spouse, pt medicated for pain with morphine throughout the shift, foley removed and patient is voiding adequately, will continue to monitor Means, Myrtie Hawk RN 04-14-12 16:05pm

## 2012-04-14 NOTE — Anesthesia Postprocedure Evaluation (Signed)
  Anesthesia Post-op Note  Patient: Olivia [name]  Procedure(s) Performed: Procedure(s) (LRB): LAPAROSCOPIC REPAIR OF HIATAL HERNIA (N/A)  Patient Location: PACU  Anesthesia Type: General  Level of Consciousness: awake and alert   Airway and Oxygen Therapy: Patient Spontanous Breathing  Post-op Pain: mild  Post-op Assessment: Post-op Vital signs reviewed, Patient's Cardiovascular Status Stable, Respiratory Function Stable, Patent Airway and No signs of Nausea or vomiting  Post-op Vital Signs: stable  Complications: No apparent anesthesia complications. Nausea, gagging improved with phenergan, and then later some neck pain. Will give Toradol

## 2012-04-14 NOTE — Op Note (Signed)
Surgeon: Wenda Low, MD, FACS  Asst:  Ovidio Kin M.D. FACS  Anes:  General with postoperative injection of Exparel  Procedure: Laparoscopic repair of hiatal hernia with 2 sutures posteriorly, re\re siting of APS lap band  Diagnosis: Lap band APS October 2009 by Dr. Colin Benton, good weight loss followed by onset of GERD  Complications: None  EBL:   5 cc  Description of Procedure:  Patient was taken to room 1 and given general anesthesia. The abdomen was prepped with PCMX and draped sterilely. Timeout was performed and the patient was then permitted to the left upper quadrant using a 0 Optiview to enter the abdomen without difficulty. 5 Millimeter trocar placed and following insufflation all fives were used until I had to upgrade the middle right lower to an 11 mm.  Nathanson fracture was replaced for placed the upper abdomen and the foregut expose. Dissection around previous band and on buckling was performed. The sizing tubing was passed and inflated with 10 cc of air and it easily pull back up into the esophagus indicating a lax hiatus.  I then performed a posterior dissection of the esophagogastric junction and identify the right and left crura and opposed those with 2 interrupted Surgidek sutures placed with the Endo Stitch and Ty knots. The balloon test was repeated and it was snug and the balloon would not slip up into the esophagus. We decided to re\re site the band and after on buckling I completely removed it left in the abdomen. I went up on the posterior inferior aspect of the right crus and created a new site for the posterior tract and passed the band passer around. The new band was in the band location was used in this the APS band brought back around and buckled. Had taken down the previous plication and used a portion of that to place 3 sutures which band location.  We had cut the tubing just distal to the connector and were able to bring the 2 ends up through the 11 mm trocar and  reattached status. I then tested the band and tubing with 5 cc of saline. Ultimately I removed the 3 cc of saline were made in the band to make completely deflated. The port which is a nice subcutaneous lie was left in its position. Wounds were injected with Exparel and closed 4-0 Vicryl and Dermabond. Patient our the procedure well was taken recovery room in satisfactory condition.   Matt B. Daphine Deutscher, MD, [facility], [place] [phone]

## 2012-04-14 NOTE — Transfer of Care (Signed)
Immediate Anesthesia Transfer of Care Note  Patient: Olivia [name]  Procedure(s) Performed: Procedure(s) (LRB): LAPAROSCOPIC REPAIR OF HIATAL HERNIA (N/A)  Patient Location: PACU  Anesthesia Type: General  Level of Consciousness: sedated, patient cooperative and responds to stimulaton  Airway & Oxygen Therapy: Patient Spontanous Breathing and Patient connected to face mask oxgen  Post-op Assessment: Report given to PACU RN and Post -op Vital signs reviewed and stable  Post vital signs: Reviewed and stable  Complications: No apparent anesthesia complications

## 2012-04-14 NOTE — H&P (Addendum)
Chief Complaint:  Recurrent reflux and inability to adequately tighten band  History of Present Illness:  Olivia [name] is an 46 y.o. female whose husband has done great with his band.   Olivia [name] came and I reviewed her upper GI which showed free reflux and a hiatal hernia. This is been documented with her upper GI as well as endoscopy. I showed her the hiatal hernia repair with lapband poster that was presented at [facility] and we discussed trying to go in and do a posterior repair. She is been frustrated but really wants to preserve her band. I described repair including a posterior dissection and hopefully hiatal hernia approximation posteriorly. We may need to resite her band after repairing the hiatal hernia. She was to go and get this scheduled.  Past medical history she denies any history of DVT. She's had some issues with her and hoarseness.  HEENT exam unremarkable neck is supple chest clear to auscultation. Heart sinus rhythm with a almost bradycardia. No murmurs. Abdomen nontender. Port seems to be subcutaneous.  Extremities exam shows no tenderness. No history of DVT. Neuro alert and oriented x3. Margins are function grossly intact.  She understands the risk and benefits of the procedure. We'll plan posterior hilar hernia repair possibly with re\re siting of her band. We'll go ahead and schedule  She had Lapband APS in October 2009 by Dr. Colin Benton:  PROCEDURE: Laparoscopic adjustable gastric banding with the APS  Allergan system and a subcutaneous port.  SURGEON: Alfonse Ras, MD  ASSISTANT: Velora Heckler, MD.     Past Medical History  Diagnosis Date  . Allergy   . Reflux   . Contact lens/glasses fitting   . Hiatal hernia   . PONV (postoperative nausea and vomiting)   . Headache     hx migraines- hormonal  . Dysrhythmia     PAC's-- related to meds- saw cardio 5 years ago  . Asthma   . Pneumonia 3/13    Past Surgical History  Procedure Date  . Tonsillectomy 1981  .  Laparoscopic gastric banding 08/08/08    Current Facility-Administered Medications  Medication Dose Route Frequency Provider Last Rate Last Dose  . bupivacaine liposome (EXPAREL) 1.3 % injection 266 mg  20 mL Infiltration Once Valarie Merino, MD      . heparin 5000 UNIT/ML injection           . heparin injection 5,000 Units  5,000 Units Subcutaneous 120 min pre-op Valarie Merino, MD   5,000 Units at 04/14/12 0553   Facility-Administered Medications Ordered in Other Encounters  Medication Dose Route Frequency Provider Last Rate Last Dose  . acetaminophen (OFIRMEV) IV    PRN Mechele Dawley, CRNA   1,000 mg at 04/14/12 0703  . lactated ringers infusion    Continuous PRN Mechele Dawley, CRNA      . scopolamine (TRANSDERM-SCOP) 1.5 MG    PRN Mechele Dawley, CRNA   1 patch at 04/14/12 1610   Cephalosporins; Eggs or egg-derived products; Penicillins; and Sulfa drugs cross reactors Family History  Problem Relation Age of Onset  . Diabetes Mother   . Obesity Mother   . Colon polyps Mother   . Cancer Mother     colon  . Obesity Father   . Hypertension Father   . Obesity Sister    Social History:   reports that she has never smoked. She does not have any smokeless tobacco history on file. She reports that she does  not drink alcohol or use illicit drugs.   REVIEW OF SYSTEMS - PERTINENT POSITIVES ONLY: GER  Physical Exam:   Blood pressure 123/84, pulse 68, temperature 97.3 F (36.3 C), temperature source Oral, resp. rate 18, SpO2 97.00%. There is no height or weight on file to calculate BMI.  Gen:  WDWN WF NAD  Neurological: Alert and oriented to person, place, and time. Motor and sensory function is grossly intact  Head: Normocephalic and atraumatic.  Eyes: Conjunctivae are normal. Pupils are equal, round, and reactive to light. No scleral icterus.  Neck: Normal range of motion. Neck supple. No tracheal deviation or thyromegaly present.  Cardiovascular:  SR without  murmurs or gallops.  No carotid bruits Respiratory: Effort normal.  No respiratory distress. No chest wall tenderness. Breath sounds normal.  No wheezes, rales or rhonchi.  Abdomen:  Nontender, band and port in place GU: Musculoskeletal: Normal range of motion. Extremities are nontender. No cyanosis, edema or clubbing noted Lymphadenopathy: No cervical, preauricular, postauricular or axillary adenopathy is present Skin: Skin is warm and dry. No rash noted. No diaphoresis. No erythema. No pallor. Pscyh: Normal mood and affect. Behavior is normal. Judgment and thought content normal.   LABORATORY RESULTS: Results for orders placed during the hospital encounter of 04/13/12 (from the past 48 hour(s))  SURGICAL PCR SCREEN     Status: Abnormal   Collection Time   04/13/12  9:00 AM      Component Value Range Comment   MRSA, PCR NEGATIVE  NEGATIVE     Staphylococcus aureus POSITIVE (*) NEGATIVE    CBC     Status: Abnormal   Collection Time   04/13/12  9:00 AM      Component Value Range Comment   WBC 7.0  4.0 - 10.5 (K/uL)    RBC 4.93  3.87 - 5.11 (MIL/uL)    Hemoglobin 12.0  12.0 - 15.0 (g/dL)    HCT 16.1  09.6 - 04.5 (%)    MCV 75.7 (*) 78.0 - 100.0 (fL)    MCH 24.3 (*) 26.0 - 34.0 (pg)    MCHC 32.2  30.0 - 36.0 (g/dL)    RDW 40.9  81.1 - 91.4 (%)    Platelets 318  150 - 400 (K/uL)   HCG, SERUM, QUALITATIVE     Status: Normal   Collection Time   04/13/12  9:00 AM      Component Value Range Comment   Preg, Serum NEGATIVE  NEGATIVE    COMPREHENSIVE METABOLIC PANEL     Status: Abnormal   Collection Time   04/13/12  9:00 AM      Component Value Range Comment   Sodium 137  135 - 145 (mEq/L)    Potassium 3.7  3.5 - 5.1 (mEq/L)    Chloride 102  96 - 112 (mEq/L)    CO2 27  19 - 32 (mEq/L)    Glucose, Bld 90  70 - 99 (mg/dL)    BUN 15  6 - 23 (mg/dL)    Creatinine, Ser 7.82  0.50 - 1.10 (mg/dL)    Calcium 9.5  8.4 - 10.5 (mg/dL)    Total Protein 7.4  6.0 - 8.3 (g/dL)    Albumin 3.3 (*) 3.5  - 5.2 (g/dL)    AST 25  0 - 37 (U/L)    ALT 13  0 - 35 (U/L)    Alkaline Phosphatase 51  39 - 117 (U/L)    Total Bilirubin 0.4  0.3 - 1.2 (mg/dL)  GFR calc non Af Amer >90  >90 (mL/min)    GFR calc Af Amer >90  >90 (mL/min)   DIFFERENTIAL     Status: Normal   Collection Time   04/13/12  9:00 AM      Component Value Range Comment   Neutrophils Relative 59  43 - 77 (%)    Neutro Abs 4.0  1.7 - 7.7 (K/uL)    Lymphocytes Relative 29  12 - 46 (%)    Lymphs Abs 2.0  0.7 - 4.0 (K/uL)    Monocytes Relative 8  3 - 12 (%)    Monocytes Absolute 0.5  0.1 - 1.0 (K/uL)    Eosinophils Relative 3  0 - 5 (%)    Eosinophils Absolute 0.2  0.0 - 0.7 (K/uL)    Basophils Relative 1  0 - 1 (%)    Basophils Absolute 0.1  0.0 - 0.1 (K/uL)     RADIOLOGY RESULTS: Dg Chest 2 View  04/13/2012  *RADIOLOGY REPORT*  Clinical Data: Preop for repair of hiatal hernia  CHEST - 2 VIEW  Comparison: Thoracic spine films of 03/08/2010  Findings: The lungs are clear.  Mediastinal contours are stable. The heart is within normal limits in size.  There is a mild thoracolumbar scoliosis present.  A lap band is noted and appears to be unchanged in position.  IMPRESSION:  1.  No active lung disease. 2.  Mild thoracolumbar scoliosis. 3.  Lap band appears stable.  Original Report Authenticated By: Juline Patch, M.D.    Problem List: Patient Active Problem List  Diagnoses  . HYPERLIPIDEMIA  . OBESITY  . URI  . ALLERGIC RHINITIS  . ASTHMA  . GERD (gastroesophageal reflux disease) with hiatus hernia and APS Band    Assessment & Plan: GER and hiatus hernia with lapband in place.  Plan revision    Matt B. Daphine Deutscher, MD, [facility] [phone] beeper [phone]  04/14/2012 7:24 AM

## 2012-04-15 ENCOUNTER — Inpatient Hospital Stay (HOSPITAL_COMMUNITY): Payer: 59

## 2012-04-15 LAB — DIFFERENTIAL
Basophils Absolute: 0 10*3/uL (ref 0.0–0.1)
Basophils Relative: 0 % (ref 0–1)
Eosinophils Absolute: 0 10*3/uL (ref 0.0–0.7)
Eosinophils Relative: 0 % (ref 0–5)
Monocytes Absolute: 0.7 10*3/uL (ref 0.1–1.0)

## 2012-04-15 LAB — CBC
MCH: 24.4 pg — ABNORMAL LOW (ref 26.0–34.0)
MCHC: 32.6 g/dL (ref 30.0–36.0)
MCV: 74.9 fL — ABNORMAL LOW (ref 78.0–100.0)
Platelets: 284 10*3/uL (ref 150–400)
RDW: 15.3 % (ref 11.5–15.5)
WBC: 7 10*3/uL (ref 4.0–10.5)

## 2012-04-15 MED ORDER — KETOROLAC TROMETHAMINE 30 MG/ML IJ SOLN
30.0000 mg | Freq: Four times a day (QID) | INTRAMUSCULAR | Status: DC | PRN
  Administered 2012-04-15 (×2): 30 mg via INTRAVENOUS
  Filled 2012-04-15 (×2): qty 1

## 2012-04-15 MED ORDER — OXYCODONE-ACETAMINOPHEN 5-325 MG/5ML PO SOLN: 5.0000 mL | ORAL | Status: DC | PRN

## 2012-04-15 MED ORDER — KETOROLAC TROMETHAMINE 10 MG PO TABS: 10.0000 mg | ORAL_TABLET | Freq: Four times a day (QID) | ORAL | Status: AC | PRN

## 2012-04-15 NOTE — Progress Notes (Signed)
Pt alert and oriented; husband at bedside; VSS; tolerating water well; awaiting UGI; denies any nausea or vomiting; c/o gas pain in shoulders but +flatus; voiding without difficulty; ambulating in hallways without difficulty; c/o some abdominal discomfort with relief from prn pain meds; pt had previous lap band surgery in 2009 and is aware of 2 week post op diet; pt already has follow up appt with [facility]; discharge instructions reviewed and pt verbalized understanding of. ADJUSTABLE GASTRIC BAND DISCHARGE INSTRUCTIONS  Drs. Fredrik Rigger, Hoxworth, Wilson, and [place] Call if you have any problems.   Call [phone] and ask for the surgeon on call.    If you need immediate assistance come to the ER at [facility]. Tell the ER personnel that you are a new post-op gastric banding patient. Signs and symptoms to report:   Severe vomiting or nausea. If you cannot tolerate clear liquids for longer than 1 day, you need to call your surgeon.    Abdominal pain which does not get better after taking your pain medication   Fever greater than 101 F degree   Difficulty breathing   Chest pain    Redness, swelling, drainage, or foul odor at incision sites    If your incisions open or pull apart   Swelling or pain in calf (lower leg)   Diarrhea, frequent watery, uncontrolled bowel movements.   Constipation, (no bowel movements for 3 days) if this occurs, Take Milk of Magnesia, 2 tablespoons by mouth, 3 times a day for 2 days if needed.  Call your doctor if constipation continues. Stop taking Milk of Magnesia once you have had a bowel movement. You may also use Miralax according to the label instructions.   Anything you consider "abnormal for you".   Normal side effects after Surgery:   Unable to sleep at night or concentrate   Irritability   Being tearful (crying) or depressed   These are common complaints, possibly related to your anesthesia, stress of surgery and change in lifestyle, that usually go away a  few weeks after surgery.  If these feelings continue, call your medical doctor.  Wound Care You may have surgical glue, steri-strips, or staples over your incisions after surgery.  Surgical glue:  Looks like a clear film over your incisions and will wear off gradually. Steri-strips: Strips of tape over your incisions. You may notice a yellowish color on the skin underneath the steri-strips. This is a substance used to make the steri-strips stick better. Do not pull the steri-strips off - let them fall off. Staples: Cherlynn Polo may be removed before you leave the hospital. If you go home with staples, call [facility] [phone]) for an appointment with your surgeon's nurse to have staples removed in 7 - 10 days. Showering: You may shower two days after your surgery unless otherwise instructed by your surgeon. Wash gently around wounds with warm soapy water, rinse well, and gently pat dry.  If you have a drain, you may need someone to hold this while you shower. Avoid tub baths until staples are removed and incisions are healed.    Medications   Medications should be liquid or crushed if larger than the size of a dime.  Extended release pills should not be crushed.   Depending on the size and number of medications you take, you may need to stagger/change the time you take your medications so that you do not over-fill your pouch.    Make sure you follow-up with your primary care physician to  make medication adjustments needed during rapid weight loss and life-style adjustment.   If you are diabetic, follow up with the doctor that prescribes your diabetes medication(s) within one week after surgery and check your blood sugar regularly.   Do not drive while taking narcotics!   Do not take acetaminophen (Tylenol) and Roxicet or Lortab Elixir at the same time since these pain medications contain acetaminophen.  Diet at home: (First 2 Weeks)  You will see the nutritionist two weeks after your  surgery. She will advance your diet if you are tolerating liquids well. Once at home, if you have severe vomiting or nausea and cannot tolerate clear liquids lasting longer than 1 day, call your surgeon.  For Same Day Surgery Discharge Patients: The day of surgery drink water only: 2 ounces every 4 hours. If you are tolerating water, begin drinking your high protein shake the next morning. For Overnight Stay Patients: Begin high protein shake 2 ounces every 3 hours, 5 - 6 times per day.  Gradually increase the amount you drink as tolerated.  You may find it easier to slowly sip shakes throughout the day.  It is important to get your proteins in first.   Protein Shake   Drink at least 2 ounces of shake 5-6 times per day   Each serving of protein shakes should have a minimum of 15 grams of protein and no more than 5 grams of carbohydrate    Increase the amount of protein shake you drink as tolerated   Protein powder may be added to fluids such as non-fat milk or Lactaid milk (limit to 20 grams added protein powder per serving   The initial goal is to drink at least 8 ounces of protein shake/drink per day (or as directed by the nutritionist). Some examples of protein shakes are [employer], [employer], EAS Edge HP, and [employer]jury. Hydration   Gradually increase the amount of water and other liquids as tolerated (See Acceptable Fluids)   Gradually increase the amount of protein shake as tolerated     Sip fluids slowly and throughout the day   May use Sugar substitutes, use sparingly (limit to 6 - 8 packets per day).  Your fluid goal is 64 ounces of fluid daily. It may take a few weeks to build up to this.         32 oz (or more) should be clear liquids and 32 oz (or more) should be full liquids.         Liquids should not contain sugar, caffeine, or carbonation!  Acceptable Fluids Clear Liquids:   Water or Sugar-free flavored water, Fruit H2O   Decaffeinated coffee or tea (sugar-free)    Crystal Lite, Wyler's Lite, Minute Maid Lite   Sugar-free Jell-O   Bouillon or broth   Sugar-free Popsicle:   *Less than 20 calories each; Limit 1 per day   Full Liquids:              Protein Shakes/Drinks + 2 choices per day of other full liquids shown below.    Other full liquids must be: No more than 12 grams of Carbs per serving,  No more than 3 grams of Fat per serving   Strained low-fat cream soup   Non-Fat milk   Fat-free Lactaid Milk   Sugar-free yogurt (Dannon Lite & Fit) Vitamins and Minerals (Start 1 day after surgery unless otherwise directed)   1 Chewable Multivitamin / Multimineral Supplement (i.e. Centrum for Adults)   Chewable  Calcium Citrate with Vitamin D-3. Take 1500 mg each day.           (Example: 3 Chewable Calcium Plus 600 with Vitamin D-3 can be found at [facility])           Do not mix multivitamins containing iron with calcium supplements; take 2 hours   apart   Do not substitute Tums (calcium carbonate) for your calcium   Menstruating women and those at risk for anemia may need extra iron. Talk with your doctor to see if you need additional iron.     If you need extra iron:  Total daily Iron recommendations (including Vitamins) = 50 - 100 mg Iron/day Do not stop taking or change any vitamins or minerals until you talk to your nutritionist or surgeon. Your nutritionist and / or physician must approve all vitamin and mineral supplements. Exercise For maximum success, begin exercising as soon as your doctor recommends. Make sure your physician approves any physical activity.   Depending on fitness level, begin with a simple walking program   Walk 5-15 minutes each day, 7 days per week.    Slowly increase until you are walking 30-45 minutes per day   Consider joining our BELT program. [phone] or email [email or web address] Things to remember:   You may have sexual relations when you feel comfortable. It is VERY important for female patients to use a reliable birth  control method. Fertility often increases after surgery. Do not get pregnant for at least 18 months.   It is very important to keep all follow up appointments with your surgeon, nutritionist, primary care physician, and behavioral health practitioner. After the first year, please follow up with your bariatric surgeon at least once a year in order to maintain best weight loss results.  [facility]: [phone] [facility]: [phone]   Free counseling is available for you and your family through collaboration between [facility] and [facility]. Please call [phone] and leave a message.    Consider purchasing a medical alert bracelet that says you had lap-band surgery.    [facility] has a free Bariatric Surgery Support Group that meets monthly, the 3rd Thursday, 6 pm, Classroom #1, [place]. You may register online at [email or web address], but registration is not necessary. Select Classes and Support Groups, Bariatric Surgery, or Call [phone]   Do not return to work or drive until cleared by your surgeon   Use your CPAP when sleeping if applicable   Do not lift anything greater than ten pounds for at least two weeks.   You will probably have your first fill (fluid added to your band) 6 weeks after surgery  Talmadge Chad, RN Bariatric Nurse Coordinator

## 2012-04-15 NOTE — Discharge Summary (Signed)
Physician Discharge Summary  Patient ID: Olivia [name] MRN: [id number] DOB/AGE: Feb 09, 1966 46 y.o.  Admit date: 04/14/2012 Discharge date: 04/15/2012  Admission Diagnoses:  Obesity and GER with Lapband in place  Discharge Diagnoses:  same  Active Problems:  * No active hospital problems. *    Surgery:  Repair of hiatus hernia and re banding with same APS through new tract  Discharged Condition: good  Hospital Course:   Had surgery and kept overnight.  Did well and ready to go  Consults: none  Significant Diagnostic Studies: UGI    Discharge Exam: Blood pressure 110/63, pulse 55, temperature 97.9 F (36.6 C), temperature source Oral, resp. rate 18, height 5\' 7"  (1.702 m), weight 266 lb 4 oz (120.77 kg), last menstrual period 04/15/2012, SpO2 97.00%. No incisional pain  Disposition: 01-Home or Self Care  Discharge Orders    Future Appointments: Provider: Department: Dept Phone: Center:   05/08/2012 2:10 PM Valarie Merino, MD [facility] [phone] None     Future Orders Please Complete By Expires   Diet Carb Modified      Increase activity slowly        Medication List  As of 04/15/2012  4:27 PM   TAKE these medications         acetaminophen 325 MG tablet   Commonly known as: TYLENOL   Take 650 mg by mouth every 6 (six) hours as needed. For headaches      albuterol 108 (90 BASE) MCG/ACT inhaler   Commonly known as: PROVENTIL HFA;VENTOLIN HFA   Inhale 2 puffs into the lungs every 6 (six) hours as needed. For shortness of breath      B-12 2500 MCG Tabs   Take 1 tablet by mouth daily with breakfast.      cetirizine 10 MG tablet   Commonly known as: ZYRTEC   Take 10 mg by mouth at bedtime.      famotidine 10 MG tablet   Commonly known as: PEPCID   Take 10 mg by mouth at bedtime.      fish oil-omega-3 fatty acids 1000 MG capsule   Take 2 g by mouth daily. Krill      ketorolac 10 MG tablet   Commonly known as: TORADOL   Take 1 tablet (10 mg total) by  mouth every 6 (six) hours as needed for pain.      mometasone 50 MCG/ACT nasal spray   Commonly known as: NASONEX   Place 2 sprays into the nose daily as needed. For allergies      multivitamin with minerals Tabs   Take 1 tablet by mouth daily.      oxyCODONE-acetaminophen 5-325 MG/5ML solution   Commonly known as: ROXICET   Take 5-10 mLs by mouth every 4 (four) hours as needed.           Follow-up Information    Schedule an appointment as soon as possible for a visit with Valarie Merino, MD.   Contact information:   [facility] [address] [place] [address] [phone]          Signed: Valarie Merino 04/15/2012, 4:27 PM

## 2012-04-15 NOTE — Progress Notes (Signed)
UGI results called to Dr. Daphine Deutscher; orders received to advance to POD #1 diet; Trula Ore, RN aware of results and orders. Talmadge Chad, RN Bariatric Nurse Coordinator

## 2012-04-15 NOTE — Progress Notes (Signed)
UR complete 

## 2012-04-15 NOTE — Progress Notes (Signed)
Pt c/o severe pain in her upper chest and across her shoulders when she takes a deep breath that radiates to her right neck.  States that her abd doesen't hurt as much as her neck and shoulders do.  Up to 6 mg of Morphine doesen't relieve the pain.  Notified MD on call, received order for Toradol.  Will continue to monitor.

## 2012-04-15 NOTE — Progress Notes (Signed)
Patient provided with discharge instructions and prescription. Patient verbalized understanding. Patient discharged to home. 

## 2012-04-15 NOTE — Discharge Instructions (Signed)
Bariatric Surgery (Gastrointestinal Surgery for Severe Obesity) Severe obesity is a longstanding condition. It is difficult to treat through diet and exercise alone. Gastrointestinal surgery is the best option for people who are severely obese and cannot lose weight by traditional means, or who suffer from serious obesity-related health problems. The surgery promotes weight loss by decreasing the absorption of food and, in some operations, interrupting the digestive process. As in other treatments for obesity, the best results are achieved with healthy eating behaviors and regular physical activity.  People who may consider gastrointestinal surgery include those with a body mass index (BMI) above 40. This is about 100 pounds of overweight for men and 80 pounds for women. People with a BMI between 35 and 40 and who suffer from type 2 diabetes or life-threatening cardiopulmonary (heart and lung) problems, such as severe sleep apnea or obesity-related heart disease, may also be candidates for surgery. (To use the Body Mass Index chart. find your weight on the bottom of the graph. Go straight up from that point until you come to the line that matches your height. Then look to find your weight group). The idea of gastrointestinal surgery to control obesity grew out of results of operations for cancer or severe ulcers that removed large portions of the stomach or small intestine. Patients undergoing these procedures tended to lose weight after surgery. So some physicians began to use such operations to treat severe obesity. The first operation that was widely used for severe obesity was the intestinal bypass. This operation was first used 40 years ago. It produced weight loss by causing malabsorption. The idea was that patients could eat large amounts of food, which would be poorly digested or passed along too fast for the body to absorb many calories. The problem with this surgery was that it caused a loss of  essential nutrients. Also, its side effects were unpredictable and sometimes fatal. The original form of the intestinal bypass operation is no longer used. THE NORMAL DIGESTIVE PROCESS Normally, as food moves along the digestive tract, digestive juices and enzymes digest and absorb calories and nutrients. After we chew and swallow our food, it moves down the esophagus to the stomach. There a strong acid continues the digestive process. The stomach can hold about 3 pints of food at one time. When the stomach contents move to the first portion of the small intestine (duodenum ), bile and pancreatic juice speed up digestion. Most of the iron and calcium in the foods we eat is absorbed in the duodenum. The jejunum and ileum are the remaining two segments of the nearly 20 feet of small intestine. They complete the absorption of almost all calories and nutrients. The food particles that cannot be digested in the small intestine are stored in the large intestine until eliminated.  HOW DOES SURGERY PROMOTE WEIGHT LOSS? Gastrointestinal surgery for obesity is also called bariatric surgery. It alters the digestive process. The operations promote weight loss by closing off parts of the stomach. This will make it smaller. Operations that only reduce stomach size are known as "restrictive operations". They restrict the amount of food the stomach can hold. Some operations combine stomach restriction with a partial bypass of the small intestine. These procedures create a direct connection from the stomach to the lower segment of the small intestine. This causes bypassing portions of the digestive tract that absorb calories and nutrients. These are known as malabsorptive operations. WHAT ARE THE SURGICAL OPTIONS? There are several types of restrictive and   malabsorptive operations. Each one carries its own benefits and risks.  Restrictive Operations  Restrictive operations serve only to restrict food intake. They do not  interfere with the normal digestive process. To perform the surgery, doctors create a small pouch at the top of the stomach where food enters from the esophagus. At first, the pouch holds about 1 ounce of food. It later expands to 2-3 ounces. The lower outlet of the pouch usually has a diameter of only about  inch. This small outlet delays the emptying of food from the pouch and causes a feeling of fullness. As a result of this surgery, most people lose the ability to eat large amounts of food at one time. After an operation, the person usually can eat only  to 1 cup of food without discomfort or nausea. Also, food has to be well chewed. Restrictive operations for obesity include adjustable gastric banding (AGB) and vertical banded gastroplasty (VBG).   Adjustable gastric banding  In this procedure, a hollow band made of special material is placed around the stomach near its upper end. This creates a small pouch and a narrow passage into the larger remainder of the stomach. The band is then inflated with a salt solution. It can be tightened or loosened over time to change the size of the passage by increasing or decreasing the amount of salt solution.   The band is adjusted based on feelings of hunger and weight loss. Patients decide when they need an adjustment and come to their surgeons to evaluate this. The adjustment is done as an office visit. The band is fully reversible with a second surgery if the patient changes his/her mind. There is no cutting or re-routing of the intestine.   Vertical banded gastroplasty  VBG has been the most common restrictive operation for weight control. Both a band and staples are used to create a small stomach pouch. Vertical banded gastroplasty is based on the same principle of restriction as the band. But the stomach is surgically altered with the stapling. This treatment is not reversible.   Restrictive operations lead to weight loss in almost all patients. But they  are less successful than malabsorptive operations in achieving substantial, long-term weight loss. About 30 percent of those who undergo VBG achieve normal weight. About 80 percent achieve some degree of weight loss. Some patients regain weight. Others are unable to adjust their eating habits and fail to lose the desired weight. Successful results depend on the patient's willingness to adopt a long-term plan of healthy eating and regular physical activity.   A common risk of restrictive operations is vomiting. This is caused when the small stomach is overly stretched by food particles that have not been chewed well. Band slippage and saline leakage have been reported after AGB. Risks of VBG include wearing away of the band and breakdown of the staple line. In a small number of cases, stomach juices may leak into the abdomen. This requires an emergency operation. In less than 1 percent of all cases, infection or death from complications may occur.  Malabsorptive Operations  Malabsorptive operations are the most common gastrointestinal surgeries for weight loss. They restrict both food intake and the amount of calories and nutrients the body absorbs.   Roux-en-Y gastric bypass (RGB)  This operation is the most common and successful malabsorptive surgery. First, a small stomach pouch is created to restrict food intake. Next, a Y-shaped section of the small intestine is attached to the pouch. This allows food   to bypass the lower stomach, the first segment of the small intestine (duodenum), and the first portion of the jejunum (the second segment of the small intestine). This bypass reduces the amount of calories and nutrients the body absorbs.   Biliopancreatic diversion (BPD)  In this more complicated malabsorptive operation, portions of the stomach are removed. The small pouch that remains is connected directly to the final segment of the small intestine, completely bypassing the duodenum and the jejunum.  This procedure successfully promotes weight loss. But it is less frequently used than other types of surgery because of the high risk for nutritional deficiencies. A variation of BPD includes a "duodenal switch". This leaves a larger portion of the stomach intact, including the pyloric valve. This valve regulates the release of stomach contents into the small intestine. It also keeps a small part of the duodenum in the digestive pathway.   Malabsorptive operations produce more weight loss than restrictive operations. And they are more effective in reversing the health problems associated with severe obesity. Patients who have malabsorptive operations generally lose two-thirds of their excess weight within 2 years.   In addition to the risks of restrictive surgeries, malabsorptive operations also carry greater risk for nutritional deficiencies. This is because the procedure causes food to bypass the duodenum and jejunum. That is where most iron and calcium are absorbed. Menstruating women may develop anemia because not enough vitamin B12 and iron are absorbed. Decreased absorption of calcium may also bring on osteoporosis and metabolic bone disease. Patients are required to take nutritional supplements that usually prevent these deficiencies. Patients who have the biliopancreatic diversion surgery must also take fat-soluble (dissolved by fat) vitamins A, D, E, and K supplements.   RGB and BPD operations may also cause "dumping syndrome". This means that stomach contents move too rapidly through the small intestine. Symptoms include nausea, weakness, sweating, faintness, and sometimes diarrhea after eating. The duodenal switch operation keeps the pyloric valve intact. So it may reduce the likelihood of dumping syndrome.   The more extensive the bypass, the greater the risk is for complications and nutritional deficiencies. Patients with extensive bypasses of the normal digestive process require close  monitoring. They also need life-long use of special foods, supplements, and medications.  EXPLORE BENEFITS AND RISKS Surgery to produce weight loss is a serious undertaking. Anyone thinking about surgery should understand what the operation involves. Patients and physicians should carefully consider the following benefits and risks.  Benefits  Right after surgery, most patients lose weight quickly. They continue to lose for 18 to 24 months after the procedure. Most patients regain 5 to 10 percent of the weight they lost. But many maintain a long-term weight loss of about 100 pounds.   Surgery improves most obesity-related conditions. For example, in one study blood sugar levels of 83 percent of obese patients with diabetes returned to normal after surgery. Nearly all patients whose blood sugar levels did not return to normal were older. Or they had lived with diabetes for a long time.  Risks  Ten to 20 percent of patients who have weight-loss surgery require follow-up operations to correct complications. Abdominal hernia was the most common complication requiring follow-up surgery. But laparoscopic techniques seem to have solved this problem. In laparoscopy, the surgeon makes one or more small incisions. Slender surgical instruments are passed them. This technique eliminates the need for a large incision. And it creates less tissue damage. Patients who are super obese (greater than 350 pounds) or have had   previous abdominal surgery, may not be good candidates for laparoscopy. Less common complications include breakdown of the staple line and stretched stomach outlets.   Some obese patients who have weight-loss surgery develop gallstones. These are clumps of cholesterol and other matter that form in the gallbladder. During quick or substantial weight loss, one's risk of developing gallstones increases. Taking supplemental bile salts for the first 6 months after surgery can prevent them.   Nearly 30  percent of patients who have weight-loss surgery develop nutritional deficiencies. These include anemia, osteoporosis, and metabolic bone disease. These usually can be avoided if vitamin and mineral intakes are high enough.   Women of childbearing age should avoid pregnancy until their weight becomes stable. Quick weight loss and nutritional deficiencies can harm a growing fetus.   Other risks of restrictive surgeries include:   Band slippage.   Stomach prolapse.   Band erosion into the lumen of the stomach.   Port infection.   The main risk with malabsorption operations is life threatening. It is the risk of leak from any of the anastomosis. The more involved the operation, the more risk involved.   There is one other risk of having the surgery. If people do not follow a strict diet, they will stretch out their stomach pouches. Then they will not lose weight.  MEDICAL COSTS Gastrointestinal surgery costs vary. They depend on the procedure. Medical insurance coverage varies by state and insurance provider. If you are considering gastrointestinal surgery, contact your r egional Medicare or Medicaid office or your insurance plan. Find out from them if the procedure is covered. IS THE SURGERY FOR YOU?  Gastrointestinal surgery may be the next step for people who remain severely obese after trying nonsurgical approaches or have an obesity-related disease. Candidates for surgery have:   A BMI of 40 or more.   A BMI of 35 or more and a life-threatening obesity-related health problem such as:   Diabetes.   Severe sleep apnea.   Heart disease.   Obesity-related physical problems that interfere with:   Employment.   Walking.   Family function.  If you fit the profile for surgery, answers to these questions may help you decide whether weight-loss surgery is appropriate for you. Are you:  Unlikely to lose weight successfully without surgery?   Well informed about the surgical  procedure? The effects of treatment?   Determined to lose weight? Improve your health?   Aware of how your life may change after the operation? Adjustment to the side effects of the surgery include the need to chew well and being unable to eat large meals.   Aware of the potential for serious complications? Dietary restrictions? Occasional failures?   Committed to lifelong medical follow-up?   Restrictive operations are very successful with patients who follow a diet created by a dietician. Support groups and follow up with caregivers is important.  Remember: There are no guarantees for any method to produce and maintain weight loss. This includes surgery. Success is possible only with:  Maximum cooperation.   Commitment to behavioral change.   Medical follow-up.  This cooperation and commitment must be carried out for the rest of your life.  ADDITIONAL RESOURCES [employer]ariatric [employer] [address], [address] [place] [address] www.asmbs.org  Weight-control Information Network (WIN) [address] [place] [address] www.niddk.nih.gov/health/nutrit/nutrit.htm Document Released: 10/21/2005 Document Revised: 10/10/2011 Document Reviewed: 01/14/2007 Exit[employer] Patient Information 2012 [employer]. 

## 2012-04-16 ENCOUNTER — Other Ambulatory Visit (INDEPENDENT_AMBULATORY_CARE_PROVIDER_SITE_OTHER): Payer: Self-pay | Admitting: General Surgery

## 2012-04-16 ENCOUNTER — Telehealth (INDEPENDENT_AMBULATORY_CARE_PROVIDER_SITE_OTHER): Payer: Self-pay | Admitting: General Surgery

## 2012-04-16 ENCOUNTER — Encounter (HOSPITAL_COMMUNITY): Payer: Self-pay | Admitting: Surgery

## 2012-04-16 ENCOUNTER — Ambulatory Visit (HOSPITAL_COMMUNITY)
Admission: RE | Admit: 2012-04-16 | Discharge: 2012-04-16 | Disposition: A | Discharge status: Home / Self Care | Payer: 59 | Source: Ambulatory Visit | Attending: Surgery | Admitting: Surgery

## 2012-04-16 DIAGNOSIS — M79604 Pain in right leg: Secondary | ICD-10-CM

## 2012-04-16 DIAGNOSIS — M79609 Pain in unspecified limb: Secondary | ICD-10-CM | POA: Insufficient documentation

## 2012-04-16 DIAGNOSIS — M79605 Pain in left leg: Secondary | ICD-10-CM

## 2012-04-16 DIAGNOSIS — Z9889 Other specified postprocedural states: Secondary | ICD-10-CM | POA: Insufficient documentation

## 2012-04-16 LAB — CK TOTAL AND CKMB (NOT AT ARMC): Total CK: 85 U/L (ref 7–177)

## 2012-04-16 NOTE — Telephone Encounter (Signed)
Pt called having leg pain bilatera per Dr Daphine Deutscher send for a doppler study

## 2012-04-16 NOTE — Progress Notes (Signed)
*  PRELIMINARY RESULTS* Vascular Ultrasound Lower extremity venous duplex has been completed.  Preliminary findings: Bilaterally no evidence of DVT or baker's cyst.   Called results to Dr. Ermalene Searing nurse [place].  Farrel Demark, RDMS 04/16/2012, 3:28 PM

## 2012-04-17 ENCOUNTER — Telehealth (INDEPENDENT_AMBULATORY_CARE_PROVIDER_SITE_OTHER): Payer: Self-pay

## 2012-04-17 NOTE — Telephone Encounter (Signed)
Pt calling for her lab results from yesterday. I notified pt that the labs were in normal range but I would still notify [place] Ana that pt was calling in case if there were more information that needed to be given to the pt.

## 2012-05-08 ENCOUNTER — Ambulatory Visit (INDEPENDENT_AMBULATORY_CARE_PROVIDER_SITE_OTHER): Payer: Commercial Managed Care - PPO | Admitting: Surgery

## 2012-05-08 ENCOUNTER — Encounter (INDEPENDENT_AMBULATORY_CARE_PROVIDER_SITE_OTHER): Payer: Self-pay | Admitting: Surgery

## 2012-05-08 VITALS — BP 105/62 | HR 72 | Temp 98.5°F | Wt 261.8 lb

## 2012-05-08 DIAGNOSIS — Z9884 Bariatric surgery status: Secondary | ICD-10-CM

## 2012-05-08 NOTE — Patient Instructions (Signed)

## 2012-05-08 NOTE — Progress Notes (Signed)
Olivia [name] 47 y.o.  There is no height on file to calculate BMI.  Patient Active Problem List  Diagnosis  . HYPERLIPIDEMIA  . OBESITY  . URI  . ALLERGIC RHINITIS  . ASTHMA  . Lapband APS resited with [facility] repair June 2013    Allergies  Allergen Reactions  . Cephalosporins Anaphylaxis  . Eggs Or Egg-Derived Products Hives and Nausea And Vomiting    Reaction=angioedema   . Penicillins Anaphylaxis  . Sulfa Drugs Cross Reactors Anaphylaxis    Past Surgical History  Procedure Date  . Tonsillectomy 1981  . Laparoscopic gastric banding 08/08/08  . Hiatal hernia repair 04/14/2012    Procedure: LAPAROSCOPIC REPAIR OF HIATAL HERNIA;  Surgeon: Valarie Merino, MD;  Location: [facility]S;  Service: General;  Laterality: N/A;  Hiatal Hernia Repair-Replacement of Lap Band  . Hernia repair 04/14/12    hiatal hernia   Rogelia Boga, MD 1. Lapband APS resited with [facility] repair June 2013     Feeling a bit more hungry.  3 cc removed from band at time of resiting.  Added back. 3 cc  Matt B. Daphine Deutscher, MD, [facility] [phone] beeper [phone]  05/08/2012 3:39 PM

## 2012-06-30 ENCOUNTER — Telehealth: Payer: Self-pay | Admitting: Internal Medicine

## 2012-06-30 MED ORDER — MONTELUKAST SODIUM 10 MG PO TABS: 10.0000 mg | ORAL_TABLET | Freq: Every day | ORAL | Status: DC

## 2012-06-30 NOTE — Telephone Encounter (Signed)
done

## 2012-06-30 NOTE — Telephone Encounter (Signed)
Caller: [place]/Patient; Patient Name: Olivia [name]; PCP: Eleonore Chiquito [facility]); Best Callback Phone Number: [phone].  Call regarding Allergy flare-up, Singulair prescription request. Zyrtec not helping.  Per Patient, Dr Kirtland Bouchard, advised me to call back if Zyrtec wasn't working.  Patient has had to use Inhaler approximately twice a week.  Denies wheezing. All emergent symptoms ruled out per Allergies Protocol, see in 24 hours.  Patient uses [facility] [address]

## 2012-06-30 NOTE — Telephone Encounter (Signed)
Please advise 

## 2012-06-30 NOTE — Telephone Encounter (Signed)
singulai  10 mg  #60 one daily

## 2012-07-09 ENCOUNTER — Encounter (INDEPENDENT_AMBULATORY_CARE_PROVIDER_SITE_OTHER): Payer: Commercial Managed Care - PPO | Admitting: Surgery

## 2012-07-17 ENCOUNTER — Encounter (INDEPENDENT_AMBULATORY_CARE_PROVIDER_SITE_OTHER): Payer: Commercial Managed Care - PPO | Admitting: Surgery

## 2012-07-26 ENCOUNTER — Encounter (HOSPITAL_COMMUNITY): Payer: Self-pay | Admitting: *Deleted

## 2012-07-26 ENCOUNTER — Emergency Department (HOSPITAL_COMMUNITY)
Admission: EM | Admit: 2012-07-26 | Discharge: 2012-07-26 | Disposition: A | Discharge status: Home / Self Care | Payer: 59 | Source: Home / Self Care

## 2012-07-26 DIAGNOSIS — M792 Neuralgia and neuritis, unspecified: Secondary | ICD-10-CM

## 2012-07-26 DIAGNOSIS — M5412 Radiculopathy, cervical region: Secondary | ICD-10-CM

## 2012-07-26 HISTORY — DX: Other allergy status, other than to drugs and biological substances: Z91.09

## 2012-07-26 MED ORDER — VALACYCLOVIR HCL 1 G PO TABS: 1000.0000 mg | ORAL_TABLET | Freq: Three times a day (TID) | ORAL | Status: DC

## 2012-07-26 NOTE — ED Notes (Signed)
Pt concerned she may have shingles.  Had HAs this past week with photophobia (not her typical migraines), then last night started with left wrist pain, numbness, and tingling radiating up into arm.  This morning woke up and noticed pain and lesion on left thumb, which is also numb and tingly.  Has taken Aleve for pain.

## 2012-07-26 NOTE — ED Provider Notes (Signed)
Medical screening examination/treatment/procedure(s) were performed by non-physician practitioner and as supervising physician I was immediately available for consultation/collaboration.  Raynald Blend, MD 07/26/12 1106

## 2012-07-26 NOTE — ED Provider Notes (Signed)
History     CSN: [id number]  Arrival date & time 07/26/12  4098   None     Chief Complaint  Patient presents with  . Numbness  . Pain    (Consider location/radiation/quality/duration/timing/severity/associated sxs/prior treatment) HPI Comments: Last night this 46 year old female patient experienced pain in the left forearm and wrist. The pain was excruciating for a short while in moderate to severe, it is now improved. This morning she noticed a vesicle on her left of, this area is sensitive and painful to touch. He feels the type of numbness and coldness from her left shoulder down to the thumb along the radial aspect of the forearm and bicep. No known repetitive use injury no known trauma. She is concerned this may be the start of shingles and  is requesting a prescription for Valtrex   Past Medical History  Diagnosis Date  . Allergy   . Reflux   . Contact lens/glasses fitting   . Hiatal hernia   . PONV (postoperative nausea and vomiting)   . Headache     hx migraines- hormonal  . Dysrhythmia     PAC's-- related to meds- saw cardio 5 years ago  . Asthma   . Pneumonia 3/13  . Migraine aura without headache   . Environmental allergies     Past Surgical History  Procedure Date  . Tonsillectomy 1981  . Laparoscopic gastric banding 08/08/08  . Hiatal hernia repair 04/14/2012    Procedure: LAPAROSCOPIC REPAIR OF HIATAL HERNIA;  Surgeon: Valarie Merino, MD;  Location: [facility];  Service: General;  Laterality: N/A;  Hiatal Hernia Repair-Replacement of Lap Band  . Hernia repair 04/14/12    hiatal hernia    Family History  Problem Relation Age of Onset  . Diabetes Mother   . Obesity Mother   . Colon polyps Mother   . Cancer Mother     colon  . Obesity Father   . Hypertension Father   . Obesity Sister     History  Substance Use Topics  . Smoking status: Never Smoker   . Smokeless tobacco: Not on file  . Alcohol Use: No    OB History    Grav Para Term Preterm  Abortions TAB SAB Ect Mult Living                  Review of Systems  Constitutional: Negative for fever, chills and activity change.  HENT: Negative.   Respiratory: Negative.   Cardiovascular: Negative.   Musculoskeletal: Negative.        As per HPI  Skin: Negative for color change, pallor and rash.  Neurological: Negative.  Negative for tremors and weakness.       As per history of present illness    Allergies  Cephalosporins; Eggs or egg-derived products; Penicillins; and Sulfa drugs cross reactors  Home Medications   Current Outpatient Rx  Name Route Sig Dispense Refill  . ALBUTEROL SULFATE HFA 108 (90 BASE) MCG/ACT IN AERS Inhalation Inhale 2 puffs into the lungs every 6 (six) hours as needed. For shortness of breath    . CETIRIZINE HCL 10 MG PO TABS Oral Take 10 mg by mouth at bedtime.     Marland Kitchen FAMOTIDINE 10 MG PO TABS Oral Take 10 mg by mouth at bedtime.     Marland Kitchen MONTELUKAST SODIUM 10 MG PO TABS Oral Take 1 tablet (10 mg total) by mouth at bedtime. 60 tablet 0    Please call pt when ready for pick  up  . UNABLE TO FIND  Progesterone daily    . UNABLE TO FIND  "Mirivelle" estrogen    . ACETAMINOPHEN 325 MG PO TABS Oral Take 650 mg by mouth every 6 (six) hours as needed. For headaches    . B-12 2500 MCG PO TABS Oral Take 1 tablet by mouth daily with breakfast.     . OMEGA-3 FATTY ACIDS 1000 MG PO CAPS Oral Take 2 g by mouth daily. Krill    . MOMETASONE FUROATE 50 MCG/ACT NA SUSP Nasal Place 2 sprays into the nose daily as needed. For allergies    . ADULT MULTIVITAMIN W/MINERALS CH Oral Take 1 tablet by mouth daily.    . OXYCODONE-ACETAMINOPHEN 5-325 MG/5ML PO SOLN Oral Take 5-10 mLs by mouth every 4 (four) hours as needed. 200 mL 0  . VALACYCLOVIR HCL 1 G PO TABS Oral Take 1 tablet (1,000 mg total) by mouth 3 (three) times daily. X 7 days 21 tablet 0    BP 133/69  Pulse 63  Temp 97.7 F (36.5 C) (Oral)  Resp 16  SpO2 100%  LMP 07/12/2012  Physical Exam    Constitutional: She is oriented to person, place, and time. She appears well-developed and well-nourished. No distress.  HENT:  Head: Normocephalic and atraumatic.  Eyes: EOM are normal.  Neck: Normal range of motion. Neck supple.  Musculoskeletal:       Left arm examination reveals full range of motion normal strength no tenderness in the upper arm,forearm, wrist or digits. Negative Finkelstein's, flexion and extension of the wrist is full and without pain.  Neurological: She is alert and oriented to person, place, and time. No cranial nerve deficit.       His distal motor sensory is intact however the left thumb had is hypersensitive to touch. Resists more 1-2 mm raised vesical type lesion to the pad of the thumb.  Skin: Skin is warm and dry. No rash noted. No erythema.    ED Course  Procedures (including critical care time)  Labs Reviewed - No data to display No results found.   1. Neuralgia of upper extremity       MDM  Valtrex 1000mg  tid x 7 d         Hayden Rasmussen, NP 07/26/12 [phone]

## 2012-08-18 ENCOUNTER — Encounter (HOSPITAL_COMMUNITY): Payer: Self-pay

## 2012-08-18 ENCOUNTER — Emergency Department (HOSPITAL_COMMUNITY)
Admission: EM | Admit: 2012-08-18 | Discharge: 2012-08-18 | Disposition: A | Discharge status: Home / Self Care | Payer: 59 | Attending: Emergency Medicine | Admitting: Emergency Medicine

## 2012-08-18 DIAGNOSIS — Z833 Family history of diabetes mellitus: Secondary | ICD-10-CM | POA: Insufficient documentation

## 2012-08-18 DIAGNOSIS — Z880 Allergy status to penicillin: Secondary | ICD-10-CM | POA: Insufficient documentation

## 2012-08-18 DIAGNOSIS — M7989 Other specified soft tissue disorders: Secondary | ICD-10-CM | POA: Insufficient documentation

## 2012-08-18 DIAGNOSIS — Z800 Family history of malignant neoplasm of digestive organs: Secondary | ICD-10-CM | POA: Insufficient documentation

## 2012-08-18 DIAGNOSIS — J45909 Unspecified asthma, uncomplicated: Secondary | ICD-10-CM | POA: Insufficient documentation

## 2012-08-18 DIAGNOSIS — Z881 Allergy status to other antibiotic agents status: Secondary | ICD-10-CM | POA: Insufficient documentation

## 2012-08-18 DIAGNOSIS — Z882 Allergy status to sulfonamides status: Secondary | ICD-10-CM | POA: Insufficient documentation

## 2012-08-18 DIAGNOSIS — Z8249 Family history of ischemic heart disease and other diseases of the circulatory system: Secondary | ICD-10-CM | POA: Insufficient documentation

## 2012-08-18 DIAGNOSIS — Z8489 Family history of other specified conditions: Secondary | ICD-10-CM | POA: Insufficient documentation

## 2012-08-18 DIAGNOSIS — M79646 Pain in unspecified finger(s): Secondary | ICD-10-CM

## 2012-08-18 DIAGNOSIS — Z91012 Allergy to eggs: Secondary | ICD-10-CM | POA: Insufficient documentation

## 2012-08-18 DIAGNOSIS — K219 Gastro-esophageal reflux disease without esophagitis: Secondary | ICD-10-CM | POA: Insufficient documentation

## 2012-08-18 DIAGNOSIS — M79609 Pain in unspecified limb: Secondary | ICD-10-CM | POA: Insufficient documentation

## 2012-08-18 NOTE — ED Notes (Signed)
Pt was moving chairs and felt something "pop" in her 2nd finger left hand, states it swelled to the size of a grape and was purple, then applied cold water and icepack, swelling has gone down and finger remains purple.

## 2012-08-18 NOTE — ED Provider Notes (Signed)
History     CSN: [id number]  Arrival date & time 08/18/12  2952   First MD Initiated Contact with Patient 08/18/12 [phone]      Chief Complaint  Patient presents with  . Hand Pain    (Consider location/radiation/quality/duration/timing/severity/associated sxs/prior treatment) Patient is a 46 y.o. female presenting with hand pain. The history is provided by the patient.  Hand Pain  pt indicates in past few weeks had been having pain run down along volar aspect left index finger intermittently. Today was moving chairs, and felt pain/swelling in middle/distal phalanx of finger. States was purple/swollen, appearing as if hematoma to area. Pt put ice on and swelling now much improved. Has intact function/rom of digit. No numbness. No skin lesions. No hx same.     Past Medical History  Diagnosis Date  . Allergy   . Reflux   . Contact lens/glasses fitting   . Hiatal hernia   . PONV (postoperative nausea and vomiting)   . Headache     hx migraines- hormonal  . Dysrhythmia     PAC's-- related to meds- saw cardio 5 years ago  . Asthma   . Pneumonia 3/13  . Migraine aura without headache   . Environmental allergies     Past Surgical History  Procedure Date  . Tonsillectomy 1981  . Laparoscopic gastric banding 08/08/08  . Hiatal hernia repair 04/14/2012    Procedure: LAPAROSCOPIC REPAIR OF HIATAL HERNIA;  Surgeon: Valarie Merino, MD;  Location: [facility];  Service: General;  Laterality: N/A;  Hiatal Hernia Repair-Replacement of Lap Band  . Hernia repair 04/14/12    hiatal hernia    Family History  Problem Relation Age of Onset  . Diabetes Mother   . Obesity Mother   . Colon polyps Mother   . Cancer Mother     colon  . Obesity Father   . Hypertension Father   . Obesity Sister     History  Substance Use Topics  . Smoking status: Never Smoker   . Smokeless tobacco: Not on file  . Alcohol Use: No    OB History    Grav Para Term Preterm Abortions TAB SAB Ect Mult Living                    Review of Systems  Constitutional: Negative for fever.  Skin: Negative for rash and wound.  Neurological: Negative for numbness.    Allergies  Cephalosporins; Eggs or egg-derived products; Penicillins; and Sulfa drugs cross reactors  Home Medications   Current Outpatient Rx  Name Route Sig Dispense Refill  . ALBUTEROL SULFATE HFA 108 (90 BASE) MCG/ACT IN AERS Inhalation Inhale 2 puffs into the lungs every 6 (six) hours as needed. For shortness of breath    . CETIRIZINE HCL 10 MG PO TABS Oral Take 10 mg by mouth at bedtime.     . B-12 2500 MCG PO TABS Oral Take 1 tablet by mouth daily with breakfast.     . FAMOTIDINE 10 MG PO TABS Oral Take 10 mg by mouth at bedtime.     Marland Kitchen MONTELUKAST SODIUM 10 MG PO TABS Oral Take 10 mg by mouth at bedtime.    . ADULT MULTIVITAMIN W/MINERALS CH Oral Take 1 tablet by mouth daily.    Marland Kitchen UNABLE TO FIND  Progesterone daily    . UNABLE TO FIND  "Mirivelle" estrogen      BP 132/91  Pulse 82  Temp 98.5 F (36.9 C)  SpO2 99%  LMP 07/12/2012  Physical Exam  Nursing note and vitals reviewed. Constitutional: She appears well-developed and well-nourished. No distress.  Eyes: Conjunctivae normal are normal. No scleral icterus.  Neck: No tracheal deviation present.  Cardiovascular: Normal rate.   Pulmonary/Chest: Effort normal. No respiratory distress.  Abdominal: Normal appearance.  Musculoskeletal: She exhibits no edema.       Pt with v mild swelling to middle phalanx of left index finger along volar aspect w sl faint bruising noted. Normal cap refill distally. Is able to flex at mcp/pip and dip. Able to extend finger normally. No rash/lesions, skin intact.   Neurological: She is alert.  Skin: Skin is warm and dry. No rash noted.  Psychiatric: She has a normal mood and affect.    ED Course  Procedures (including critical care time)    MDM  Earlier swelling now largely resolved. Finger of normal color w normal cap  refill distally.  Given recurrent pain in finger, will refer to hand for follow up.         Suzi Roots, MD 08/18/12 [phone]

## 2012-08-19 ENCOUNTER — Other Ambulatory Visit: Payer: Self-pay | Admitting: Internal Medicine

## 2012-09-02 ENCOUNTER — Encounter (INDEPENDENT_AMBULATORY_CARE_PROVIDER_SITE_OTHER): Payer: Commercial Managed Care - PPO | Admitting: Surgery

## 2012-09-04 ENCOUNTER — Ambulatory Visit (INDEPENDENT_AMBULATORY_CARE_PROVIDER_SITE_OTHER): Payer: Commercial Managed Care - PPO | Admitting: Surgery

## 2012-09-04 ENCOUNTER — Encounter (INDEPENDENT_AMBULATORY_CARE_PROVIDER_SITE_OTHER): Payer: Self-pay | Admitting: Surgery

## 2012-09-04 VITALS — BP 114/66 | HR 68 | Temp 97.0°F | Resp 16 | Ht 68.0 in | Wt 273.2 lb

## 2012-09-04 DIAGNOSIS — Z9884 Bariatric surgery status: Secondary | ICD-10-CM

## 2012-09-04 NOTE — Patient Instructions (Signed)

## 2012-09-04 NOTE — Progress Notes (Signed)
Olivia [name] Body mass index is 41.54 kg/(m^2).  Having regurgitation:  no  Nocturnal reflux?  no  Amount of fill  2 cc Not restricted.   Not having any reflux.   Started hormone replacement therapy and causing her to gain weight.   Added 2 cc.  Able to swallow Return 2 months

## 2012-11-06 ENCOUNTER — Encounter (INDEPENDENT_AMBULATORY_CARE_PROVIDER_SITE_OTHER): Payer: Commercial Managed Care - PPO | Admitting: Surgery

## 2013-01-27 ENCOUNTER — Other Ambulatory Visit: Payer: Self-pay | Admitting: Internal Medicine

## 2013-05-04 ENCOUNTER — Ambulatory Visit (INDEPENDENT_AMBULATORY_CARE_PROVIDER_SITE_OTHER): Payer: PRIVATE HEALTH INSURANCE | Admitting: Internal Medicine

## 2013-05-04 ENCOUNTER — Encounter: Payer: Self-pay | Admitting: Internal Medicine

## 2013-05-04 VITALS — BP 120/80 | HR 80 | Temp 98.8°F | Resp 20 | Wt 276.0 lb

## 2013-05-04 DIAGNOSIS — J309 Allergic rhinitis, unspecified: Secondary | ICD-10-CM

## 2013-05-04 DIAGNOSIS — R599 Enlarged lymph nodes, unspecified: Secondary | ICD-10-CM

## 2013-05-04 DIAGNOSIS — J45909 Unspecified asthma, uncomplicated: Secondary | ICD-10-CM

## 2013-05-04 DIAGNOSIS — R590 Localized enlarged lymph nodes: Secondary | ICD-10-CM

## 2013-05-04 DIAGNOSIS — E785 Hyperlipidemia, unspecified: Secondary | ICD-10-CM

## 2013-05-04 LAB — COMPREHENSIVE METABOLIC PANEL
AST: 18 U/L (ref 0–37)
Albumin: 4.2 g/dL (ref 3.5–5.2)
Alkaline Phosphatase: 49 U/L (ref 39–117)
Potassium: 3.7 mEq/L (ref 3.5–5.1)
Sodium: 136 mEq/L (ref 135–145)
Total Bilirubin: 0.4 mg/dL (ref 0.3–1.2)
Total Protein: 8.1 g/dL (ref 6.0–8.3)

## 2013-05-04 LAB — LIPID PANEL
HDL: 64.5 mg/dL (ref >39.00)
Triglycerides: 123 mg/dL (ref 0.0–149.0)
VLDL: 24.6 mg/dL (ref 0.0–40.0)

## 2013-05-04 LAB — CBC WITH DIFFERENTIAL/PLATELET
Basophils Relative: 0.9 % (ref 0.0–3.0)
Eosinophils Absolute: 0.2 10*3/uL (ref 0.0–0.7)
Eosinophils Relative: 1.9 % (ref 0.0–5.0)
HCT: 36.5 % (ref 36.0–46.0)
Lymphs Abs: 2.8 10*3/uL (ref 0.7–4.0)
MCHC: 31.8 g/dL (ref 30.0–36.0)
MCV: 72.9 fl — ABNORMAL LOW (ref 78.0–100.0)
Monocytes Absolute: 0.6 10*3/uL (ref 0.1–1.0)
Neutrophils Relative %: 60.6 % (ref 43.0–77.0)
Platelets: 328 10*3/uL (ref 150.0–400.0)
RBC: 5.01 Mil/uL (ref 3.87–5.11)
WBC: 9.2 10*3/uL (ref 4.5–10.5)

## 2013-05-04 NOTE — Patient Instructions (Signed)
ENT consultation as discussed    It is important that you exercise regularly, at least 20 minutes 3 to 4 times per week.  If you develop chest pain or shortness of breath seek  medical attention.  You need to lose weight.  Consider a lower calorie diet and regular exercise.

## 2013-05-04 NOTE — Progress Notes (Signed)
Subjective:    Patient ID: Olivia [name], female    DOB: 29-May-1966, 47 y.o.   MRN: [id number]  HPI  47 year old patient who is seen today for followup. She has a history mild dyslipidemia allergic rhinitis and has really done well. For the past 9 months she has had some left postauricular adenopathy but at least intermittently has been slightly uncomfortable. Denies any constitutional complaints. Has not noted any other generalized adenopathy.  Past Medical History  Diagnosis Date  . Allergy   . Reflux   . Contact lens/glasses fitting   . Hiatal hernia   . PONV (postoperative nausea and vomiting)   . Headache(784.0)     hx migraines- hormonal  . Dysrhythmia     PAC's-- related to meds- saw cardio 5 years ago  . Asthma   . Pneumonia 3/13  . Migraine aura without headache   . Environmental allergies     History   Social History  . Marital Status: Married    Spouse Name: N/A    Number of Children: N/A  . Years of Education: N/A   Occupational History  . Not on file.   Social History Main Topics  . Smoking status: Never Smoker   . Smokeless tobacco: Not on file  . Alcohol Use: No  . Drug Use: No  . Sexually Active: Yes    Birth Control/ Protection: Other-see comments     Comment: Vasectomy in spouse   Other Topics Concern  . Not on file   Social History Narrative  . No narrative on file    Past Surgical History  Procedure Laterality Date  . Tonsillectomy  1981  . Laparoscopic gastric banding  08/08/08  . Hiatal hernia repair  04/14/2012    Procedure: LAPAROSCOPIC REPAIR OF HIATAL HERNIA;  Surgeon: Valarie Merino, MD;  Location: [facility];  Service: General;  Laterality: N/A;  Hiatal Hernia Repair-Replacement of Lap Band  . Hernia repair  04/14/12    hiatal hernia    Family History  Problem Relation Age of Onset  . Diabetes Mother   . Obesity Mother   . Colon polyps Mother   . Cancer Mother     colon  . Obesity Father   . Hypertension Father   . Obesity  Sister     Allergies  Allergen Reactions  . Cephalosporins Anaphylaxis  . Eggs Or Egg-Derived Products Hives and Nausea And Vomiting    Reaction=angioedema   . Penicillins Anaphylaxis  . Sulfa Drugs Cross Reactors     Liver failure    Current Outpatient Prescriptions on File Prior to Visit  Medication Sig Dispense Refill  . albuterol (PROVENTIL HFA;VENTOLIN HFA) 108 (90 BASE) MCG/ACT inhaler Inhale 2 puffs into the lungs every 6 (six) hours as needed. For shortness of breath      . cetirizine (ZYRTEC) 10 MG tablet Take 10 mg by mouth at bedtime.       . Cyanocobalamin (B-12) 2500 MCG TABS Take 1 tablet by mouth daily with breakfast.       . famotidine (PEPCID) 10 MG tablet Take 10 mg by mouth at bedtime.       . montelukast (SINGULAIR) 10 MG tablet TAKE 1 TABLET BY MOUTH AT BEDTIME  90 tablet  1  . Multiple Vitamin (MULITIVITAMIN WITH MINERALS) TABS Take 1 tablet by mouth daily.       No current facility-administered medications on file prior to visit.    BP 120/80  Pulse 80  Temp(Src) 98.8  F (37.1 C) (Oral)  Resp 20  Wt 276 lb (125.193 kg)  BMI 41.98 kg/m2  SpO2 98%  LMP 04/22/2013       Review of Systems  Constitutional: Negative.   HENT: Negative for hearing loss, congestion, sore throat, rhinorrhea, dental problem, sinus pressure and tinnitus.   Eyes: Negative for pain, discharge and visual disturbance.  Respiratory: Negative for cough and shortness of breath.   Cardiovascular: Negative for chest pain, palpitations and leg swelling.  Gastrointestinal: Negative for nausea, vomiting, abdominal pain, diarrhea, constipation, blood in stool and abdominal distention.  Genitourinary: Negative for dysuria, urgency, frequency, hematuria, flank pain, vaginal bleeding, vaginal discharge, difficulty urinating, vaginal pain and pelvic pain.  Musculoskeletal: Negative for joint swelling, arthralgias and gait problem.  Skin: Negative for rash.  Neurological: Negative for  dizziness, syncope, speech difficulty, weakness, numbness and headaches.  Hematological: Negative for adenopathy.  Psychiatric/Behavioral: Negative for behavioral problems, dysphoric mood and agitation. The patient is not nervous/anxious.        Objective:   Physical Exam  Constitutional: She is oriented to person, place, and time. She appears well-developed and well-nourished.  Weight 276 Blood pressure normal  HENT:  Head: Normocephalic.  Right Ear: External ear normal.  Left Ear: External ear normal.  Mouth/Throat: Oropharynx is clear and moist.  Eyes: Conjunctivae and EOM are normal. Pupils are equal, round, and reactive to light.  Neck: Normal range of motion. Neck supple. No thyromegaly present.  3  1 cm nodes in the left postauricular area  Cardiovascular: Normal rate, regular rhythm, normal heart sounds and intact distal pulses.   Pulmonary/Chest: Effort normal and breath sounds normal.  Abdominal: Soft. Bowel sounds are normal. She exhibits no mass. There is no tenderness.  Musculoskeletal: Normal range of motion.  Lymphadenopathy:    She has cervical adenopathy.  Neurological: She is alert and oriented to person, place, and time.  Skin: Skin is warm and dry. No rash noted.  Psychiatric: She has a normal mood and affect. Her behavior is normal.          Assessment & Plan:   Persistent left postauricular adenopathy. We'll set up for ENT evaluation to consider biopsy Will check updated lab Dyslipidemia

## 2013-05-10 ENCOUNTER — Encounter: Payer: Self-pay | Admitting: Internal Medicine

## 2013-05-24 ENCOUNTER — Encounter: Payer: Self-pay | Admitting: Internal Medicine

## 2013-05-24 ENCOUNTER — Ambulatory Visit (INDEPENDENT_AMBULATORY_CARE_PROVIDER_SITE_OTHER): Payer: PRIVATE HEALTH INSURANCE | Admitting: Internal Medicine

## 2013-05-24 VITALS — BP 100/64 | HR 66 | Temp 98.3°F | Resp 20 | Ht 67.5 in | Wt 281.0 lb

## 2013-05-24 DIAGNOSIS — J45909 Unspecified asthma, uncomplicated: Secondary | ICD-10-CM

## 2013-05-24 DIAGNOSIS — E669 Obesity, unspecified: Secondary | ICD-10-CM

## 2013-05-24 NOTE — Progress Notes (Signed)
Subjective:    Patient ID: Olivia [name], female    DOB: May 21, 1966, 47 y.o.   MRN: [id number]  HPI  47 year old patient who is seen today for a preventive health examination. She has been seen by ENT recently due 2 left postauricular adenopathy and this has improved on antibiotic therapy. She's also to be seen and followed the to a nasal leukoplakia. Medical problems include obesity dyslipidemia allergic rhinitis and asthma. She is status post lap band surgery approximately one year ago  She has concerns about possible ADHD  Past Medical History  Diagnosis Date  . Allergy   . Reflux   . Contact lens/glasses fitting   . Hiatal hernia   . PONV (postoperative nausea and vomiting)   . Headache(784.0)     hx migraines- hormonal  . Dysrhythmia     PAC's-- related to meds- saw cardio 5 years ago  . Asthma   . Pneumonia 3/13  . Migraine aura without headache   . Environmental allergies     History   Social History  . Marital Status: Married    Spouse Name: N/A    Number of Children: N/A  . Years of Education: N/A   Occupational History  . Not on file.   Social History Main Topics  . Smoking status: Never Smoker   . Smokeless tobacco: Not on file  . Alcohol Use: No  . Drug Use: No  . Sexually Active: Yes    Birth Control/ Protection: Other-see comments     Comment: Vasectomy in spouse   Other Topics Concern  . Not on file   Social History Narrative  . No narrative on file    Past Surgical History  Procedure Laterality Date  . Tonsillectomy  1981  . Laparoscopic gastric banding  08/08/08  . Hiatal hernia repair  04/14/2012    Procedure: LAPAROSCOPIC REPAIR OF HIATAL HERNIA;  Surgeon: Valarie Merino, MD;  Location: [facility];  Service: General;  Laterality: N/A;  Hiatal Hernia Repair-Replacement of Lap Band  . Hernia repair  04/14/12    hiatal hernia    Family History  Problem Relation Age of Onset  . Diabetes Mother   . Obesity Mother   . Colon polyps Mother    . Cancer Mother     colon  . Obesity Father   . Hypertension Father   . Obesity Sister     Allergies  Allergen Reactions  . Cephalosporins Anaphylaxis  . Eggs Or Egg-Derived Products Hives and Nausea And Vomiting    Reaction=angioedema   . Penicillins Anaphylaxis  . Sulfa Drugs Cross Reactors     Liver failure    Current Outpatient Prescriptions on File Prior to Visit  Medication Sig Dispense Refill  . albuterol (PROVENTIL HFA;VENTOLIN HFA) 108 (90 BASE) MCG/ACT inhaler Inhale 2 puffs into the lungs every 6 (six) hours as needed. For shortness of breath      . cetirizine (ZYRTEC) 10 MG tablet Take 10 mg by mouth at bedtime.       . Cyanocobalamin (B-12) 2500 MCG TABS Take 1 tablet by mouth daily with breakfast.       . estradiol (MINIVELLE) 0.1 MG/24HR Place 1 patch onto the skin 2 (two) times a week.      . famotidine (PEPCID) 10 MG tablet Take 10 mg by mouth at bedtime.       . montelukast (SINGULAIR) 10 MG tablet TAKE 1 TABLET BY MOUTH AT BEDTIME  90 tablet  1  .  Multiple Vitamin (MULITIVITAMIN WITH MINERALS) TABS Take 1 tablet by mouth daily.      Marland Kitchen omeprazole (PRILOSEC) 20 MG capsule Take 20 mg by mouth every other day.      . progesterone (PROMETRIUM) 100 MG capsule Take 100 mg by mouth daily.       No current facility-administered medications on file prior to visit.    BP 100/64  Pulse 66  Temp(Src) 98.3 F (36.8 C) (Oral)  Resp 20  Ht 5' 7.5" (1.715 m)  Wt 281 lb (127.461 kg)  BMI 43.34 kg/m2  SpO2 98%  LMP 04/22/2013        Review of Systems  Constitutional: Negative.   HENT: Negative for hearing loss, congestion, sore throat, rhinorrhea, dental problem, sinus pressure and tinnitus.   Eyes: Negative for pain, discharge and visual disturbance.  Respiratory: Negative for cough and shortness of breath.   Cardiovascular: Negative for chest pain, palpitations and leg swelling.  Gastrointestinal: Negative for nausea, vomiting, abdominal pain, diarrhea,  constipation, blood in stool and abdominal distention.  Genitourinary: Negative for dysuria, urgency, frequency, hematuria, flank pain, vaginal bleeding, vaginal discharge, difficulty urinating, vaginal pain and pelvic pain.  Musculoskeletal: Negative for joint swelling, arthralgias and gait problem.  Skin: Negative for rash.  Neurological: Negative for dizziness, syncope, speech difficulty, weakness, numbness and headaches.  Hematological: Negative for adenopathy.  Psychiatric/Behavioral: Positive for behavioral problems and decreased concentration. Negative for dysphoric mood and agitation. The patient is not nervous/anxious.        Complains of easy distractibility and difficulty completing projects  Son with ADHD       Objective:   Physical Exam  Constitutional: She is oriented to person, place, and time. She appears well-developed and well-nourished.  HENT:  Head: Normocephalic and atraumatic.  Right Ear: External ear normal.  Left Ear: External ear normal.  Mouth/Throat: Oropharynx is clear and moist.  Barely palpable left postauricular nodules  Eyes: Conjunctivae and EOM are normal.  Neck: Normal range of motion. Neck supple. No JVD present. No thyromegaly present.  Cardiovascular: Normal rate, regular rhythm, normal heart sounds and intact distal pulses.   No murmur heard. Pulmonary/Chest: Effort normal and breath sounds normal. She has no wheezes. She has no rales.  Abdominal: Soft. Bowel sounds are normal. She exhibits no distension and no mass. There is no tenderness. There is no rebound and no guarding.  Genitourinary: Vagina normal.  Musculoskeletal: Normal range of motion. She exhibits no edema and no tenderness.  Neurological: She is alert and oriented to person, place, and time. She has normal reflexes. No cranial nerve deficit. She exhibits normal muscle tone. Coordination normal.  Skin: Skin is warm and dry. No rash noted.  Psychiatric: She has a normal mood and  affect. Her behavior is normal.          Assessment & Plan:  Preventive health exam Asthma Exogenous obesity Rule out ADHD. We'll set up for formal testing with Dr. Carmela Hurt  OB/GYN followup

## 2013-05-24 NOTE — Patient Instructions (Signed)
Limit your sodium (Salt) intake    It is important that you exercise regularly, at least 20 minutes 3 to 4 times per week.  If you develop chest pain or shortness of breath seek  medical attention. 

## 2013-06-25 ENCOUNTER — Other Ambulatory Visit: Payer: Self-pay | Admitting: Otolaryngology

## 2013-07-29 ENCOUNTER — Other Ambulatory Visit: Payer: Self-pay | Admitting: Internal Medicine

## 2013-08-31 ENCOUNTER — Other Ambulatory Visit: Payer: Self-pay | Admitting: Internal Medicine

## 2013-09-09 ENCOUNTER — Other Ambulatory Visit: Payer: Self-pay

## 2013-11-24 ENCOUNTER — Ambulatory Visit (INDEPENDENT_AMBULATORY_CARE_PROVIDER_SITE_OTHER): Payer: PRIVATE HEALTH INSURANCE | Admitting: Podiatry

## 2013-11-24 ENCOUNTER — Ambulatory Visit: Payer: Self-pay | Admitting: Podiatry

## 2013-11-24 ENCOUNTER — Encounter: Payer: Self-pay | Admitting: Podiatry

## 2013-11-24 VITALS — BP 106/66 | HR 81 | Resp 16 | Ht 70.0 in | Wt 270.0 lb

## 2013-11-24 DIAGNOSIS — M7750 Other enthesopathy of unspecified foot: Secondary | ICD-10-CM

## 2013-11-24 MED ORDER — TRIAMCINOLONE ACETONIDE 10 MG/ML IJ SUSP
10.0000 mg | Freq: Once | INTRAMUSCULAR | Status: AC
  Administered 2013-11-24: 10 mg

## 2013-11-24 NOTE — Progress Notes (Signed)
   Subjective:    Patient ID: Olivia [name], female    DOB: [birth year], 48 y.o.   MRN: [id number]  HPI Comments: "I am hoping he can fix this foot"  Patient states that left foot started to hurt around October 2014 after increase in exercise. Aches constantly. She has had xrays and MRI by orthopedist. Showed no fractures, but inflammation around the cuboid. Her foot is better but initially it was bruised, swelling frequently. Wore a boot for 2.5 months with no relief. Has tried new shoes with stabilizers. Referred here for additional treatment.  Foot Pain Associated symptoms include arthralgias.      Review of Systems  Musculoskeletal: Positive for arthralgias.  All other systems reviewed and are negative.       Objective:   Physical Exam        Assessment & Plan:

## 2013-11-24 NOTE — Progress Notes (Signed)
Subjective:     Patient ID: Olivia [name], female   DOB: September 26, 1966, 48 y.o.   MRN: [id number]  Foot Pain   patient presents stating I am having pain in the outside of my left foot and I been in a cast for several months I've had an MRI and it does not seem to get better. I like to do zumba and other activities and trying to lose weight   Review of Systems  All other systems reviewed and are negative.       Objective:   Physical Exam  Nursing note and vitals reviewed. Constitutional: She is oriented to person, place, and time.  Cardiovascular: Intact distal pulses.   Musculoskeletal: Normal range of motion.  Neurological: She is oriented to person, place, and time.  Skin: Skin is warm.   neurovascular status intact with no other health history changes noted and normal muscle strength range of motion with some splinting on the left side do to discomfort. Pain is most prominent in the peroneal tendon as it inserts into the base of the fifth metatarsal left foot. Reviewed MRI which is negative for tendon or significant bone injury     Assessment:     peroneal tendinitis left with possibility for cuboid syndrome    Plan:     H&P and x-ray performed. Careful sheath injection left administered to peroneal tendon and advice on wearing boot for the next several weeks and ice therapy and then return in 3 week for possible orthotics with valgus wedges

## 2013-12-01 ENCOUNTER — Ambulatory Visit: Payer: Self-pay | Admitting: Podiatry

## 2013-12-06 ENCOUNTER — Ambulatory Visit: Payer: Self-pay | Admitting: Podiatry

## 2013-12-08 ENCOUNTER — Encounter: Payer: Self-pay | Admitting: Internal Medicine

## 2013-12-08 DIAGNOSIS — E282 Polycystic ovarian syndrome: Secondary | ICD-10-CM

## 2013-12-15 ENCOUNTER — Encounter: Payer: Self-pay | Admitting: Podiatry

## 2013-12-15 ENCOUNTER — Ambulatory Visit (INDEPENDENT_AMBULATORY_CARE_PROVIDER_SITE_OTHER): Payer: PRIVATE HEALTH INSURANCE | Admitting: Podiatry

## 2013-12-15 VITALS — BP 136/73 | HR 64 | Resp 17 | Ht 70.0 in | Wt 268.0 lb

## 2013-12-15 DIAGNOSIS — M7750 Other enthesopathy of unspecified foot: Secondary | ICD-10-CM

## 2013-12-15 NOTE — Progress Notes (Signed)
Pt states her foot feels good enough to use the elliptical machine.

## 2013-12-17 NOTE — Progress Notes (Signed)
Subjective:     Patient ID: Olivia [name], female   DOB: 1966-08-24, 48 y.o.   MRN: [id number]  HPI patient states the area that you were seems pretty good but it seems that the pain is now more proximal to where it was before   Review of Systems     Objective:   Physical Exam Neurovascular status intact well oriented x3 with discomfort in a more proximal area of the peroneal tendon still distal to the lateral malleolus. The area at the insertion into the fifth metatarsal base is nonpainful at the current time    Assessment:     Tendinitis left improving at insertion with more proximal discomfort noted    Plan:     Careful proximal injection 3 mg some Kenalog combination with 5 mg Xylocaine and advice on reduced activity. Reappoint her recheck in the next 4 weeks earlier if any issues should occur

## 2013-12-21 ENCOUNTER — Institutional Professional Consult (permissible substitution): Payer: PRIVATE HEALTH INSURANCE | Admitting: Nurse Practitioner

## 2013-12-31 ENCOUNTER — Other Ambulatory Visit: Payer: Self-pay | Admitting: Internal Medicine

## 2014-01-11 ENCOUNTER — Ambulatory Visit (INDEPENDENT_AMBULATORY_CARE_PROVIDER_SITE_OTHER): Payer: PRIVATE HEALTH INSURANCE | Admitting: Nurse Practitioner

## 2014-01-11 ENCOUNTER — Encounter: Payer: Self-pay | Admitting: Nurse Practitioner

## 2014-01-11 VITALS — BP 121/81 | HR 73 | Ht 69.5 in | Wt 276.0 lb

## 2014-01-11 DIAGNOSIS — M62838 Other muscle spasm: Secondary | ICD-10-CM

## 2014-01-11 DIAGNOSIS — G43109 Migraine with aura, not intractable, without status migrainosus: Secondary | ICD-10-CM

## 2014-01-11 MED ORDER — TIZANIDINE HCL 4 MG PO TABS: 4.0000 mg | ORAL_TABLET | Freq: Every day | ORAL | Status: DC

## 2014-01-11 MED ORDER — RIZATRIPTAN BENZOATE 10 MG PO TABS: 10.0000 mg | ORAL_TABLET | ORAL | Status: DC | PRN

## 2014-01-11 NOTE — Patient Instructions (Signed)
Migraine Headache A migraine headache is an intense, throbbing pain on one or both sides of your head. A migraine can last for 30 minutes to several hours. CAUSES  The exact cause of a migraine headache is not always known. However, a migraine may be caused when nerves in the brain become irritated and release chemicals that cause inflammation. This causes pain. Certain things may also trigger migraines, such as:  Alcohol.  Smoking.  Stress.  Menstruation.  Aged cheeses.  Foods or drinks that contain nitrates, glutamate, aspartame, or tyramine.  Lack of sleep.  Chocolate.  Caffeine.  Hunger.  Physical exertion.  Fatigue.  Medicines used to treat chest pain (nitroglycerine), birth control pills, estrogen, and some blood pressure medicines. SIGNS AND SYMPTOMS  Pain on one or both sides of your head.  Pulsating or throbbing pain.  Severe pain that prevents daily activities.  Pain that is aggravated by any physical activity.  Nausea, vomiting, or both.  Dizziness.  Pain with exposure to bright lights, loud noises, or activity.  General sensitivity to bright lights, loud noises, or smells. Before you get a migraine, you may get warning signs that a migraine is coming (aura). An aura may include:  Seeing flashing lights.  Seeing bright spots, halos, or zig-zag lines.  Having tunnel vision or blurred vision.  Having feelings of numbness or tingling.  Having trouble talking.  Having muscle weakness. DIAGNOSIS  A migraine headache is often diagnosed based on:  Symptoms.  Physical exam.  A CT scan or MRI of your head. These imaging tests cannot diagnose migraines, but they can help rule out other causes of headaches. TREATMENT Medicines may be given for pain and nausea. Medicines can also be given to help prevent recurrent migraines.  HOME CARE INSTRUCTIONS  Only take over-the-counter or prescription medicines for pain or discomfort as directed by your  health care provider. The use of long-term narcotics is not recommended.  Lie down in a dark, quiet room when you have a migraine.  Keep a journal to find out what may trigger your migraine headaches. For example, write down:  What you eat and drink.  How much sleep you get.  Any change to your diet or medicines.  Limit alcohol consumption.  Quit smoking if you smoke.  Get 7 9 hours of sleep, or as recommended by your health care provider.  Limit stress.  Keep lights dim if bright lights bother you and make your migraines worse. SEEK IMMEDIATE MEDICAL CARE IF:   Your migraine becomes severe.  You have a fever.  You have a stiff neck.  You have vision loss.  You have muscular weakness or loss of muscle control.  You start losing your balance or have trouble walking.  You feel faint or pass out.  You have severe symptoms that are different from your first symptoms. MAKE SURE YOU:   Understand these instructions.  Will watch your condition.  Will get help right away if you are not doing well or get worse. Document Released: 10/21/2005 Document Revised: 08/11/2013 Document Reviewed: 06/28/2013 [employer] Patient Information 2014 [employer].  

## 2014-01-11 NOTE — Progress Notes (Signed)
New consult to with Vaughan Basta today for migraines.

## 2014-01-11 NOTE — Progress Notes (Signed)
Diagnosis: Migraine with Aura  History: Pt has migraine with aura for years. In the last 3-5 years she has developed daily headache of some type. Most have some feature of migraine. She had a CT of her sinuses with a retention cyst. She was recently diagnosed with ADD and placed on Vyvanse which she believes is helping. Her sleep is an issue , she had difficulty falling asleep up to 3 hours and difficulty staying asleep , up 1-2 times per night. She has never used a Triptan. She has muscle spasm and difficulty with medications and would prefer Botox as a preventive. She is having some issues with hormones and has an appointment with Endocrinologist.   Location: Right and left temples  Number of Headache days/month: daily x 3-5 years Severe: 2 Moderate:5 Mild:15  Current Outpatient Prescriptions on File Prior to Visit  Medication Sig Dispense Refill  . albuterol (PROVENTIL HFA;VENTOLIN HFA) 108 (90 BASE) MCG/ACT inhaler Inhale 2 puffs into the lungs every 6 (six) hours as needed. For shortness of breath      . cetirizine (ZYRTEC) 10 MG tablet Take 10 mg by mouth at bedtime.       . Cyanocobalamin (B-12) 2500 MCG TABS Take 1 tablet by mouth daily with breakfast.       . famotidine (PEPCID) 10 MG tablet Take 10 mg by mouth at bedtime.       . montelukast (SINGULAIR) 10 MG tablet TAKE 1 TABLET BY MOUTH AT BEDTIME  90 tablet  1  . Multiple Vitamin (MULITIVITAMIN WITH MINERALS) TABS Take 1 tablet by mouth daily.      Marland Kitchen PROVENTIL HFA 108 (90 BASE) MCG/ACT inhaler INHALE 2 PUFFS INTO THE LUNGS EVERY 6 HOURS AS NEEDED  6.7 each  2   No current facility-administered medications on file prior to visit.    Acute/ prevention: Excedrin, Aleve  Past Medical History  Diagnosis Date  . Allergy   . Reflux   . Contact lens/glasses fitting   . Hiatal hernia   . PONV (postoperative nausea and vomiting)   . Headache(784.0)     hx migraines- hormonal  . Dysrhythmia     PAC's-- related to meds- saw  cardio 5 years ago  . Asthma   . Pneumonia 3/13  . Migraine aura without headache   . Environmental allergies    Past Surgical History  Procedure Laterality Date  . Tonsillectomy  1981  . Laparoscopic gastric banding  08/08/08  . Hiatal hernia repair  04/14/2012    Procedure: LAPAROSCOPIC REPAIR OF HIATAL HERNIA;  Surgeon: Pedro Earls, MD;  Location: [facility];  Service: General;  Laterality: N/A;  Hiatal Hernia Repair-Replacement of Lap Band  . Hernia repair  04/14/12    hiatal hernia   Family History  Problem Relation Age of Onset  . Diabetes Mother   . Obesity Mother   . Colon polyps Mother   . Cancer Mother     colon  . Obesity Father   . Hypertension Father   . Obesity Sister    Social History:  reports that she has never smoked. She does not have any smokeless tobacco history on file. She reports that she does not drink alcohol or use illicit drugs. Allergies:  Allergies  Allergen Reactions  . Cephalosporins Anaphylaxis  . Eggs Or Egg-Derived Products Hives and Nausea And Vomiting    Reaction=angioedema   . Penicillins Anaphylaxis  . Sulfa Drugs Cross Reactors     Liver failure  Triggers: Lack of sleep, hormones, menses, stress  Birth control: Husband had Vasectomy  ROS: Positive for insomnia, migraine, allergies, ADD, PCOS, TMJ, Hiatal Hernia, negative for cardiac issues including HTN  Exam: Well developed, obese, caucasian female  General: NAD HEENT: Negative Cardiac:RRR Lungs:Clear Neuro:Nagative Skin:Warm and dry  Impression:migraine - classic Chronic daily headache Insomnia Muscle spasm ADD  Plan: Discussed the pathophysiology of migraine and medication management. She does not do well with medications and would not like to use oral preventatives. She declines MRI at this time. We will seek Botox approval. Maxalt for moderate and severe migraines. Zanaflex for muscle spasm and sleep. Discussed sleep hygiene and the role of insomnia in chronic  headache. She will change up bed time to 10 pm and use one hour from 9-10 to relax. RTC one month   Time Spent: one hour

## 2014-01-24 ENCOUNTER — Telehealth: Payer: Self-pay | Admitting: *Deleted

## 2014-01-24 NOTE — Telephone Encounter (Signed)
Called patient to advise her of her benefits regarding Botox.

## 2014-01-27 ENCOUNTER — Encounter: Payer: Self-pay | Admitting: Internal Medicine

## 2014-01-31 MED ORDER — SCOPOLAMINE 1 MG/3DAYS TD PT72: 1.0000 | MEDICATED_PATCH | TRANSDERMAL | Status: DC

## 2014-01-31 NOTE — Telephone Encounter (Signed)
Spoke to pt told her Rx for Transderm scop patch sent to pharmacy. Pt verbalized understanding.

## 2014-01-31 NOTE — Telephone Encounter (Signed)
Dr.K, I found the message. Please give order

## 2014-02-14 ENCOUNTER — Ambulatory Visit (INDEPENDENT_AMBULATORY_CARE_PROVIDER_SITE_OTHER): Payer: PRIVATE HEALTH INSURANCE | Admitting: Podiatry

## 2014-02-14 ENCOUNTER — Encounter: Payer: Self-pay | Admitting: Podiatry

## 2014-02-14 VITALS — BP 127/74 | HR 64 | Resp 12

## 2014-02-14 DIAGNOSIS — M7750 Other enthesopathy of unspecified foot: Secondary | ICD-10-CM

## 2014-02-14 NOTE — Progress Notes (Signed)
Subjective:     Patient ID: Olivia [name], female   DOB: 28-Jun-1966, 47 y.o.   MRN: [id number]  HPI patient presents with continued pain in the outside of the left foot at the insertion of the tendon the peroneal tendon to the base of the fifth metatarsal. Mild edema noted with no indications of muscle tear or other pathology   Review of Systems     Objective:   Physical Exam Neurovascular status intact with no indications of muscle strength issues and discomfort at the peroneal insertion and slightly proximal and dorsal to this area    Assessment:     Continue tendinitis of the left foot    Plan:     H&P reviewed and discussed continued conservative care with occasional boot usage ice therapy physical therapy and I have recommended orthotics to with the lateral side of the foot. Patient is scanned for customized orthotic devices

## 2014-02-22 ENCOUNTER — Ambulatory Visit (INDEPENDENT_AMBULATORY_CARE_PROVIDER_SITE_OTHER): Payer: PRIVATE HEALTH INSURANCE | Admitting: Nurse Practitioner

## 2014-02-22 ENCOUNTER — Encounter: Payer: Self-pay | Admitting: Nurse Practitioner

## 2014-02-22 VITALS — BP 120/92 | HR 81 | Ht 68.5 in | Wt 270.8 lb

## 2014-02-22 DIAGNOSIS — G43109 Migraine with aura, not intractable, without status migrainosus: Secondary | ICD-10-CM

## 2014-02-22 DIAGNOSIS — M62838 Other muscle spasm: Secondary | ICD-10-CM

## 2014-02-22 NOTE — Patient Instructions (Signed)
Migraine Headache A migraine headache is an intense, throbbing pain on one or both sides of your head. A migraine can last for 30 minutes to several hours. CAUSES  The exact cause of a migraine headache is not always known. However, a migraine may be caused when nerves in the brain become irritated and release chemicals that cause inflammation. This causes pain. Certain things may also trigger migraines, such as:  Alcohol.  Smoking.  Stress.  Menstruation.  Aged cheeses.  Foods or drinks that contain nitrates, glutamate, aspartame, or tyramine.  Lack of sleep.  Chocolate.  Caffeine.  Hunger.  Physical exertion.  Fatigue.  Medicines used to treat chest pain (nitroglycerine), birth control pills, estrogen, and some blood pressure medicines. SIGNS AND SYMPTOMS  Pain on one or both sides of your head.  Pulsating or throbbing pain.  Severe pain that prevents daily activities.  Pain that is aggravated by any physical activity.  Nausea, vomiting, or both.  Dizziness.  Pain with exposure to bright lights, loud noises, or activity.  General sensitivity to bright lights, loud noises, or smells. Before you get a migraine, you may get warning signs that a migraine is coming (aura). An aura may include:  Seeing flashing lights.  Seeing bright spots, halos, or zig-zag lines.  Having tunnel vision or blurred vision.  Having feelings of numbness or tingling.  Having trouble talking.  Having muscle weakness. DIAGNOSIS  A migraine headache is often diagnosed based on:  Symptoms.  Physical exam.  A CT scan or MRI of your head. These imaging tests cannot diagnose migraines, but they can help rule out other causes of headaches. TREATMENT Medicines may be given for pain and nausea. Medicines can also be given to help prevent recurrent migraines.  HOME CARE INSTRUCTIONS  Only take over-the-counter or prescription medicines for pain or discomfort as directed by your  health care provider. The use of long-term narcotics is not recommended.  Lie down in a dark, quiet room when you have a migraine.  Keep a journal to find out what may trigger your migraine headaches. For example, write down:  What you eat and drink.  How much sleep you get.  Any change to your diet or medicines.  Limit alcohol consumption.  Quit smoking if you smoke.  Get 7 9 hours of sleep, or as recommended by your health care provider.  Limit stress.  Keep lights dim if bright lights bother you and make your migraines worse. SEEK IMMEDIATE MEDICAL CARE IF:   Your migraine becomes severe.  You have a fever.  You have a stiff neck.  You have vision loss.  You have muscular weakness or loss of muscle control.  You start losing your balance or have trouble walking.  You feel faint or pass out.  You have severe symptoms that are different from your first symptoms. MAKE SURE YOU:   Understand these instructions.  Will watch your condition.  Will get help right away if you are not doing well or get worse. Document Released: 10/21/2005 Document Revised: 08/11/2013 Document Reviewed: 06/28/2013 [employer] Patient Information 2014 [employer].  

## 2014-02-22 NOTE — Progress Notes (Signed)
History:  Olivia [name] is a 48 y.o. No obstetric history on file. Who presents to [facility] clinic today for follow up on migraines and muscle spasms. She feels her migraines are less severe and she is having a few headache free days this is likely related to getting better sleep. She is still working toward getting Botox approved. Since our last visit she has been working on her sleep and has done better with that issue. She has also taken Zanaflex with has helped muscle spasm although she can only take 1/2 or 1/4 tablet. She has only taken Maxalt x 2 and each time it seems as if the migraine was already full force so it was not much help. She went on a cruise and when she came back and took her Scopolamine patch off she has 2 weeks of vertigo and was unable to work. She is currently seeing ENT for this issue.   The following portions of the patient's history were reviewed and updated as appropriate: allergies, current medications, past family history, past medical history, past social history, past surgical history and problem list.  Review of Systems:  Pertinent items are noted in HPI.  Objective:  Physical Exam BP 120/92  Pulse 81  Ht 5' 8.5" (1.74 m)  Wt 270 lb 12.8 oz (122.834 kg)  BMI 40.57 kg/m2  LMP 02/04/2014 GENERAL: Well-developed, well-nourished female in no acute distress.  HEENT: Normocephalic, atraumatic.  NECK: Supple. Normal thyroid.  LUNGS: Normal rate. Clear to auscultation bilaterally.  HEART: Regular rate and rhythm with no adventitious sounds.  EXTREMITIES: No cyanosis, clubbing, or edema, 2+ distal pulses.   Labs and Imaging No results found.  Assessment & Plan:  Assessment:  Migraine without Aura Muscle spasm  Plans:  Continue to work towards getting Botox approval Continue working on sleep issues Reviewed using Maxalt before migraines becomes to intense Follow up when Botox approved   Olegario Messier, NP 02/22/2014 11:13 AM

## 2014-03-02 ENCOUNTER — Other Ambulatory Visit: Payer: Self-pay | Admitting: Internal Medicine

## 2014-03-04 ENCOUNTER — Ambulatory Visit (INDEPENDENT_AMBULATORY_CARE_PROVIDER_SITE_OTHER): Payer: PRIVATE HEALTH INSURANCE | Admitting: *Deleted

## 2014-03-04 ENCOUNTER — Telehealth: Payer: Self-pay | Admitting: *Deleted

## 2014-03-04 DIAGNOSIS — M7750 Other enthesopathy of unspecified foot: Secondary | ICD-10-CM

## 2014-03-04 DIAGNOSIS — F419 Anxiety disorder, unspecified: Secondary | ICD-10-CM

## 2014-03-04 MED ORDER — ALPRAZOLAM 0.5 MG PO TABS: 0.5000 mg | ORAL_TABLET | Freq: Every evening | ORAL | Status: DC | PRN

## 2014-03-04 NOTE — Progress Notes (Signed)
   Subjective:    Patient ID: Olivia [name], female    DOB: [birth year], 48 y.o.   MRN: [id number]  HPI PICK UP ORTHOTICS AND GIVEN INSTRUCTION.    Review of Systems     Objective:   Physical Exam        Assessment & Plan:

## 2014-03-04 NOTE — Telephone Encounter (Signed)
Patient request rx for xanax.  Spoke with Vaughan Basta and confirmed ok to call in 30 tablets of xanax 0.5mg .

## 2014-03-04 NOTE — Patient Instructions (Signed)

## 2014-03-15 ENCOUNTER — Ambulatory Visit (INDEPENDENT_AMBULATORY_CARE_PROVIDER_SITE_OTHER): Payer: PRIVATE HEALTH INSURANCE | Admitting: Nurse Practitioner

## 2014-03-15 ENCOUNTER — Encounter: Payer: Self-pay | Admitting: Nurse Practitioner

## 2014-03-15 VITALS — BP 112/77 | HR 79 | Ht 68.5 in | Wt 263.0 lb

## 2014-03-15 DIAGNOSIS — G43109 Migraine with aura, not intractable, without status migrainosus: Secondary | ICD-10-CM

## 2014-03-15 DIAGNOSIS — M62838 Other muscle spasm: Secondary | ICD-10-CM

## 2014-03-15 NOTE — Progress Notes (Signed)
S: Pt in office today for Botox injections. This is her first Botox series. She is also having  A lot of muscle spasm in her shoulders and would like a trigger point injection in her neck. She has been sleeping better and treating Migraines more rapidly when they occur. She has had a lot of stress lately related to husband heart blockage and stint and a needle stick from a patient. She has a migraine today and is given an Imitrex in the office   O: Bilateral traps are tight and tender  Botox Procedure Note Lot # [id number] Expiration Date : May 2017  Botox Dosing by [employer] for Chronic Migraine   Injection Sites for Migraines  Botox 100 units was injected using the dosage in the table above in the pattern shown above.  Trigger Point Injections  Procedure: 2cc lidocaine, 2 cc marcaine, 1 cc dexamethazone. Injected with 1/2 cc each site in  bilateral traps. Pt tolerated procedure well.    A: Migraine  Muscle spasm   P: Botox 100 units injected today.  Imitrex 100 mg PO for migraine today RTC 3 Months.

## 2014-03-15 NOTE — Patient Instructions (Signed)
Migraine Headache A migraine headache is an intense, throbbing pain on one or both sides of your head. A migraine can last for 30 minutes to several hours. CAUSES  The exact cause of a migraine headache is not always known. However, a migraine may be caused when nerves in the brain become irritated and release chemicals that cause inflammation. This causes pain. Certain things may also trigger migraines, such as:  Alcohol.  Smoking.  Stress.  Menstruation.  Aged cheeses.  Foods or drinks that contain nitrates, glutamate, aspartame, or tyramine.  Lack of sleep.  Chocolate.  Caffeine.  Hunger.  Physical exertion.  Fatigue.  Medicines used to treat chest pain (nitroglycerine), birth control pills, estrogen, and some blood pressure medicines. SIGNS AND SYMPTOMS  Pain on one or both sides of your head.  Pulsating or throbbing pain.  Severe pain that prevents daily activities.  Pain that is aggravated by any physical activity.  Nausea, vomiting, or both.  Dizziness.  Pain with exposure to bright lights, loud noises, or activity.  General sensitivity to bright lights, loud noises, or smells. Before you get a migraine, you may get warning signs that a migraine is coming (aura). An aura may include:  Seeing flashing lights.  Seeing bright spots, halos, or zig-zag lines.  Having tunnel vision or blurred vision.  Having feelings of numbness or tingling.  Having trouble talking.  Having muscle weakness. DIAGNOSIS  A migraine headache is often diagnosed based on:  Symptoms.  Physical exam.  A CT scan or MRI of your head. These imaging tests cannot diagnose migraines, but they can help rule out other causes of headaches. TREATMENT Medicines may be given for pain and nausea. Medicines can also be given to help prevent recurrent migraines.  HOME CARE INSTRUCTIONS  Only take over-the-counter or prescription medicines for pain or discomfort as directed by your  health care provider. The use of long-term narcotics is not recommended.  Lie down in a dark, quiet room when you have a migraine.  Keep a journal to find out what may trigger your migraine headaches. For example, write down:  What you eat and drink.  How much sleep you get.  Any change to your diet or medicines.  Limit alcohol consumption.  Quit smoking if you smoke.  Get 7 9 hours of sleep, or as recommended by your health care provider.  Limit stress.  Keep lights dim if bright lights bother you and make your migraines worse. SEEK IMMEDIATE MEDICAL CARE IF:   Your migraine becomes severe.  You have a fever.  You have a stiff neck.  You have vision loss.  You have muscular weakness or loss of muscle control.  You start losing your balance or have trouble walking.  You feel faint or pass out.  You have severe symptoms that are different from your first symptoms. MAKE SURE YOU:   Understand these instructions.  Will watch your condition.  Will get help right away if you are not doing well or get worse. Document Released: 10/21/2005 Document Revised: 08/11/2013 Document Reviewed: 06/28/2013 [employer] Patient Information 2014 [employer].  

## 2014-03-18 ENCOUNTER — Telehealth: Payer: Self-pay | Admitting: *Deleted

## 2014-03-18 DIAGNOSIS — G43909 Migraine, unspecified, not intractable, without status migrainosus: Secondary | ICD-10-CM

## 2014-03-18 MED ORDER — ONDANSETRON 4 MG PO TBDP: 4.0000 mg | ORAL_TABLET | Freq: Three times a day (TID) | ORAL | Status: DC | PRN

## 2014-03-18 NOTE — Telephone Encounter (Signed)
Called in Zofran per [facility] request/tn

## 2014-03-21 ENCOUNTER — Ambulatory Visit: Payer: PRIVATE HEALTH INSURANCE | Admitting: Podiatry

## 2014-04-02 IMAGING — RF DG UGI W/ HIGH DENSITY W/KUB
14 of 24 series · 14 of 24 positions shown · non-contrast
Comparison: Upper GI from 09/24/2010 and CT abdomen pelvis from
01/27/2011

CLINICAL DATA: Reflux.

 UPPER GI SERIES WITH KUB
TECHNIQUE: Routine upper GI series was performed with thin and
high density barium.
Fluoroscopy Time: 2.3 minutes

[Series 1: run · 1 of 1 slices shown (1 of 14)]
[im 1/1]
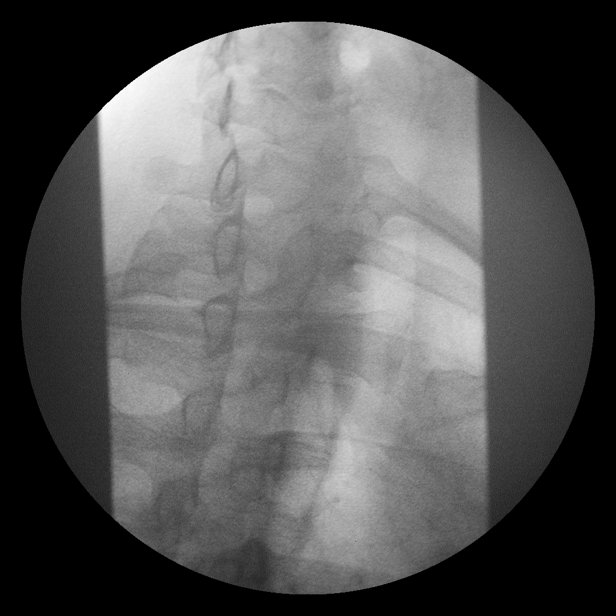

[Series 3: run · 1 of 1 slices shown (2 of 14)]
[im 1/1]
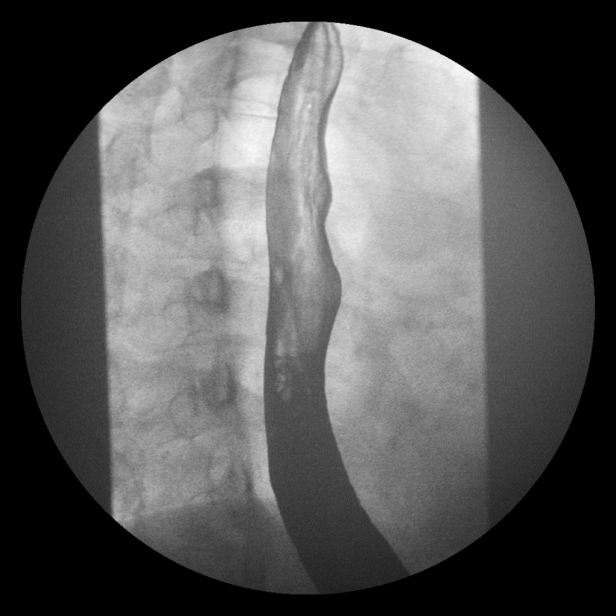

[Series 5: run · 1 of 1 slices shown (3 of 14)]
[im 1/1]
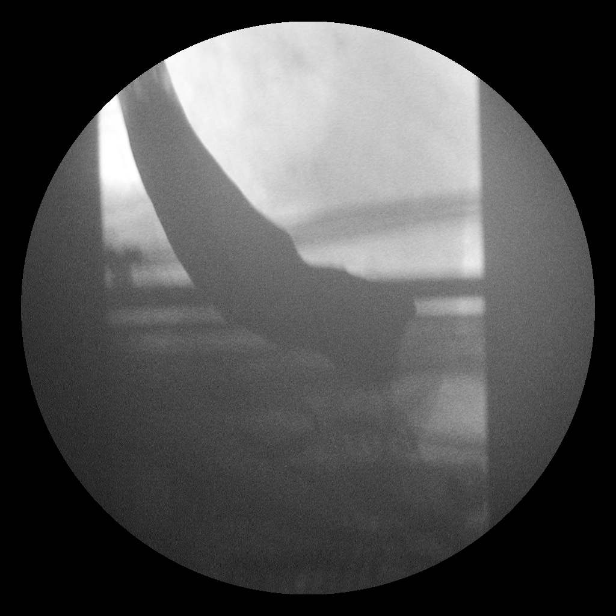

[Series 7: run · 1 of 1 slices shown (4 of 14)]
[im 1/1]
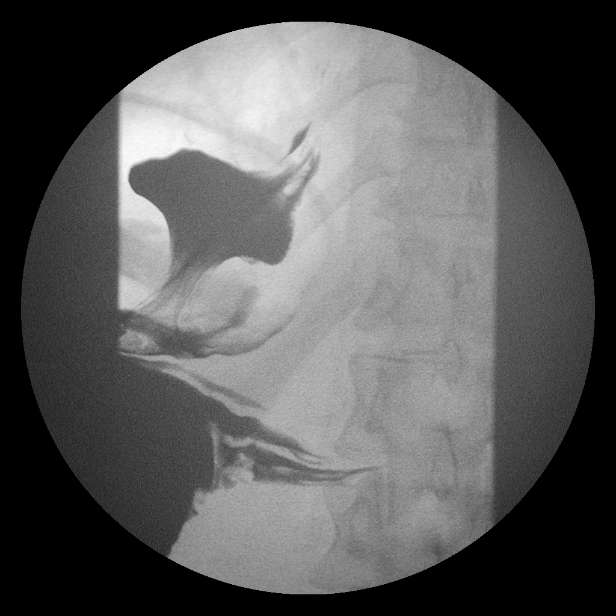

[Series 8: run · 1 of 1 slices shown (5 of 14)]
[im 1/1]
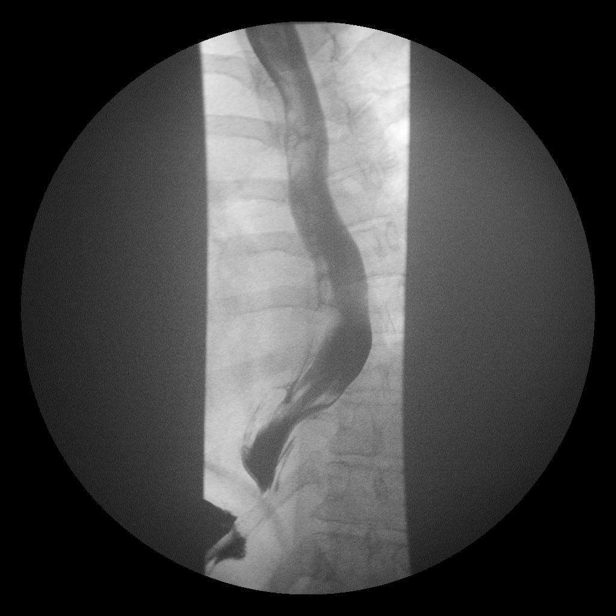

[Series 10: run · 1 of 1 slices shown (6 of 14)]
[im 1/1]
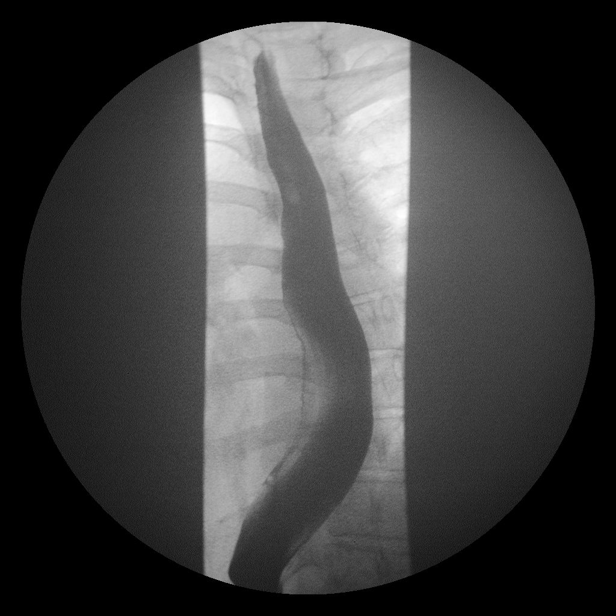

[Series 12: run · 1 of 1 slices shown (7 of 14)]
[im 1/1]
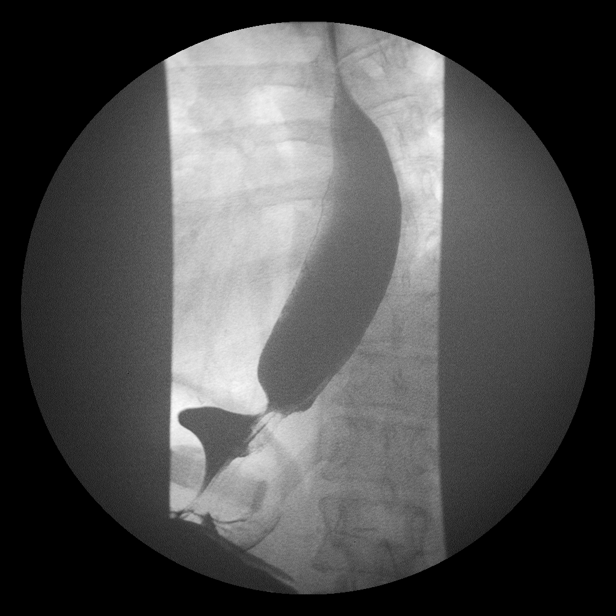

[Series 13: run · 1 of 1 slices shown (8 of 14)]
[im 1/1]
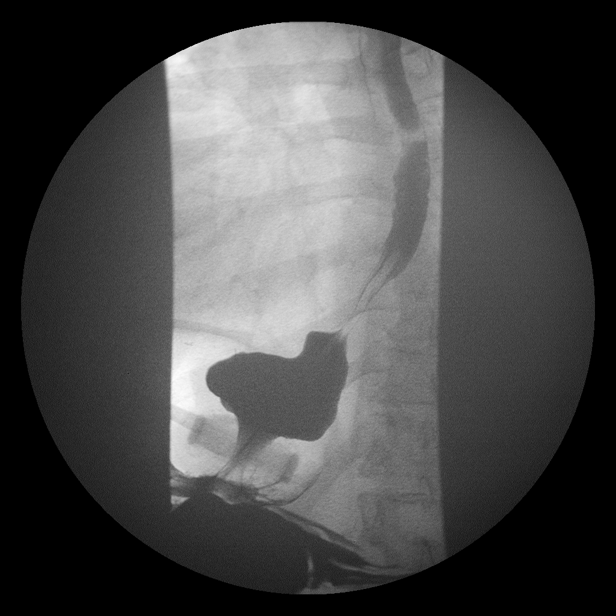

[Series 15: run · 1 of 1 slices shown (9 of 14)]
[im 1/1]
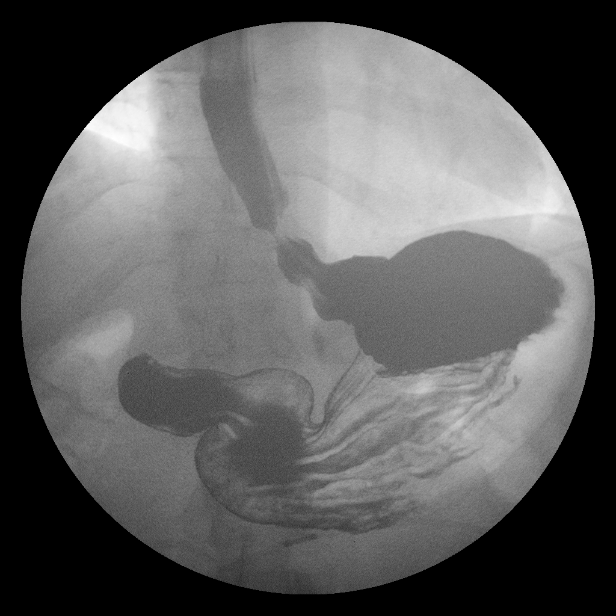

[Series 17: run · 1 of 1 slices shown (10 of 14)]
[im 1/1]
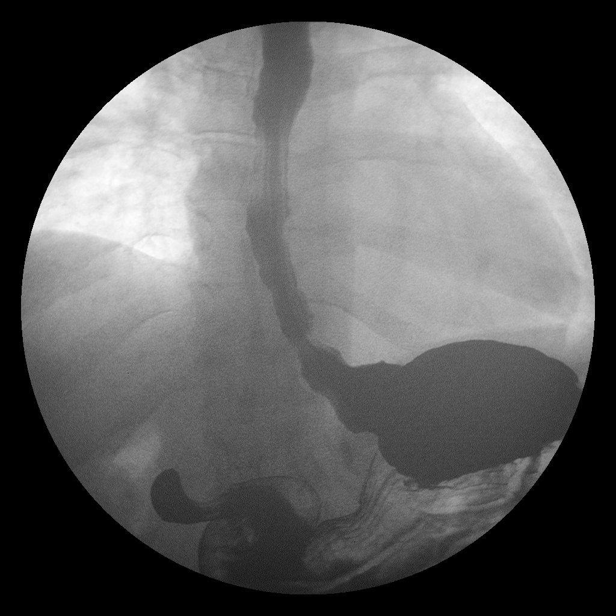

[Series 19: run · 1 of 1 slices shown (11 of 14)]
[im 1/1]
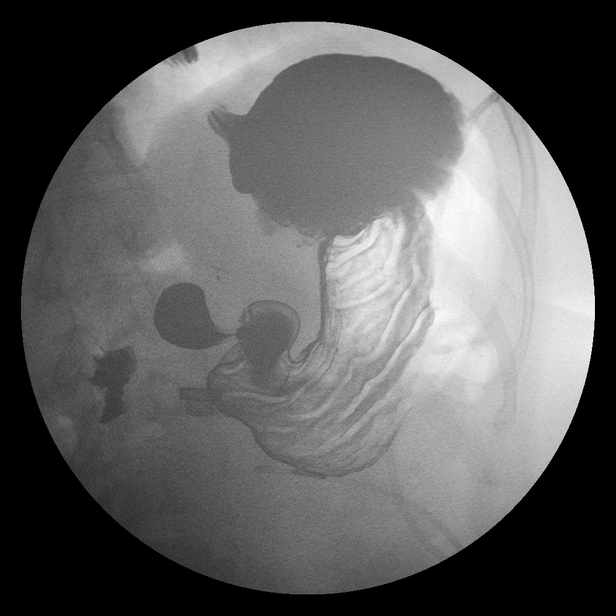

[Series 20: run · 1 of 1 slices shown (12 of 14)]
[im 1/1]
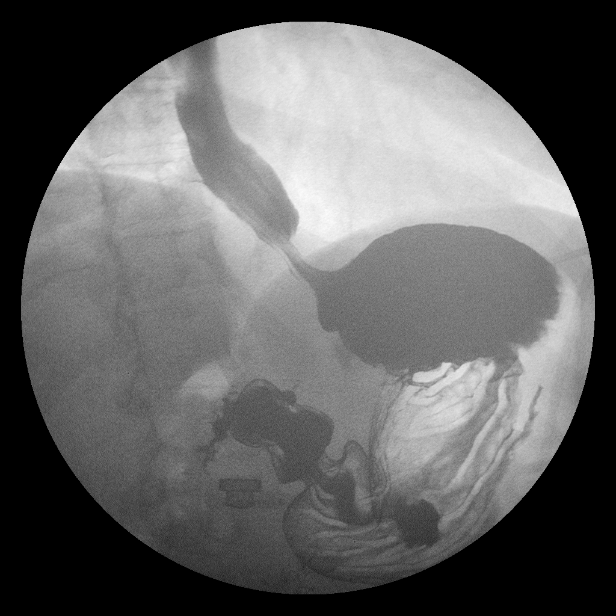

[Series 22: run · 1 of 1 slices shown (13 of 14)]
[im 1/1]
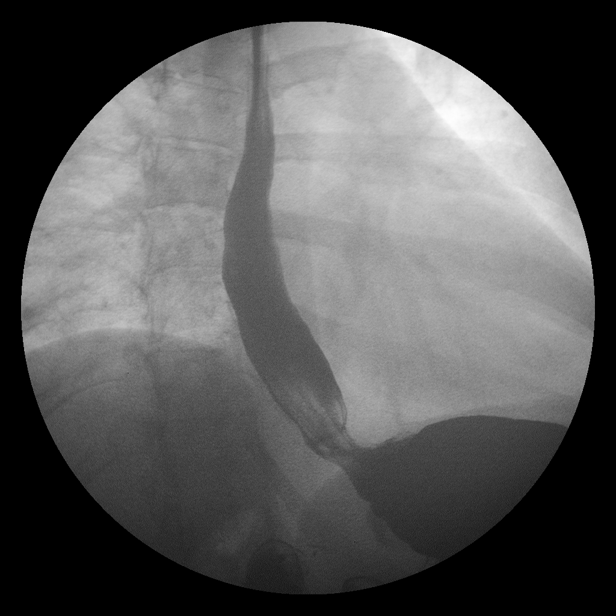

[Series 24: run · 1 of 1 slices shown (14 of 14)]
[im 1/1]
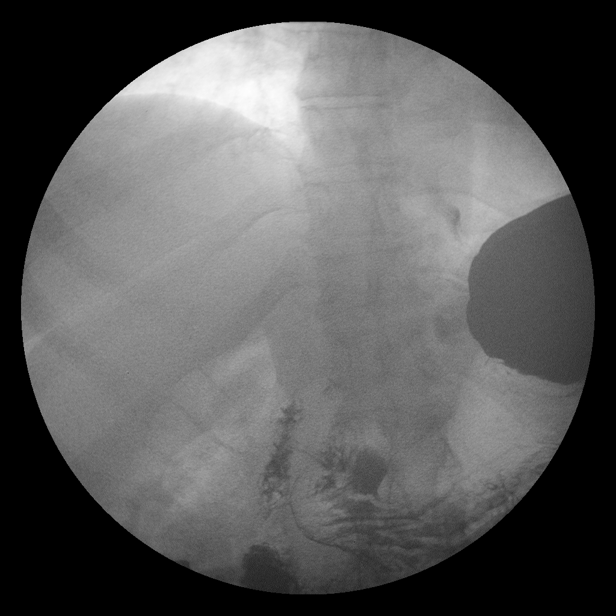

[14 of 24 positions shown; findings below may reference images not displayed]

FINDINGS: Scout view of the abdomen shows a gastric band in the
left upper quadrant, at the 2 o'clock and 8 o'clock positions,
stable from 08/08/2008. Port projects over the right abdomen.
There is a moderate amount of stool present in the colon.

Double contrast exam of the upper gastrointestinal tract similar
appearance of distal esophageal fold thickening.

Small hiatal hernia.

Multiple episodes of recurrent for chronic gastroesophageal reflux
noted.  Esophageal motility is difficult to assess due to reflux.

Stomach and duodenal bulb are otherwise unremarkable.

13 mm barium tablet was ingested which easily passed through the
esophagus and into the stomach.
IMPRESSION: 1. Multiple episodes of high grade gastroesophageal reflux, with
associated distal esophageal fold thickening. This is similar to
the previous exam.

## 2014-04-04 ENCOUNTER — Ambulatory Visit: Payer: PRIVATE HEALTH INSURANCE | Admitting: Podiatry

## 2014-04-14 ENCOUNTER — Ambulatory Visit (INDEPENDENT_AMBULATORY_CARE_PROVIDER_SITE_OTHER): Payer: PRIVATE HEALTH INSURANCE | Admitting: Podiatry

## 2014-04-14 ENCOUNTER — Encounter: Payer: Self-pay | Admitting: Podiatry

## 2014-04-14 VITALS — BP 128/72 | HR 66 | Resp 12

## 2014-04-14 DIAGNOSIS — M7750 Other enthesopathy of unspecified foot: Secondary | ICD-10-CM

## 2014-04-14 MED ORDER — TRIAMCINOLONE ACETONIDE 10 MG/ML IJ SUSP
10.0000 mg | Freq: Once | INTRAMUSCULAR | Status: AC
  Administered 2014-04-14: 10 mg

## 2014-04-14 NOTE — Progress Notes (Signed)
Subjective:     Patient ID: Olivia [name], female   DOB: 01-Apr-1966, 48 y.o.   MRN: [id number]  HPI patient presents stating the outside of my left foot is still hurting anytime I try to do activity and this is now been going on for around 9 months   Review of Systems     Objective:   Physical Exam Neurovascular status intact with continued discomfort around the peroneal tendon just proximal to its insertion into the base of fifth metatarsal    Assessment:     Chronic tendinitis left versus consideration of tear which may be has occurred worse over the last 5 months    Plan:     Complete immobilization recommended along with injection and topical and improvement not forthcoming we are going to do an MRI and have to consider exploratory surgery depending on results

## 2014-04-22 ENCOUNTER — Telehealth: Payer: Self-pay | Admitting: *Deleted

## 2014-04-22 NOTE — Telephone Encounter (Signed)
Need to see the chart

## 2014-04-22 NOTE — Telephone Encounter (Signed)
I called and told her that Dr. Paulla Dolly ordered a prescription from a company called [facility].  It's a compounding company.  I just sent an order.  They should call you to verify insurance.  She stated oh okay.  I started to question myself about whether or not it was going to be called into the pharmacy.  I thought he said a compounding company.  She asked if she should call them because it's been a week.  I told her no, they should call soon and they will deliver medicine to your house.  She stated thanks for calling me back.

## 2014-04-22 NOTE — Telephone Encounter (Signed)
Saw him last week.  He did an injection in my foot.  He was supposed to be [facility] [address] [facility] compound [facility]  [facility]  [facility] said they would get in touch with you.  I still don't have it.  Please call to verify what I'm supposed to be taking.

## 2014-04-29 ENCOUNTER — Telehealth: Payer: Self-pay | Admitting: *Deleted

## 2014-04-29 DIAGNOSIS — S86312D Strain of muscle(s) and tendon(s) of peroneal muscle group at lower leg level, left leg, subsequent encounter: Secondary | ICD-10-CM

## 2014-04-29 NOTE — Telephone Encounter (Signed)
I've been on foot rest after some injections.  The injection has not done the magic.  Can we go ahead and get the MRI scheduled instead of waiting until I come back in?  Give me a call back.  I'd appreciate it.

## 2014-05-02 NOTE — Telephone Encounter (Signed)
I called and informed the patient that Dr. Josephina Shih the MRI.  I told her we use [facility] located on the corner of [address].  They should call you to schedule the appointment.  She stated okay, I'll be looking for their call.

## 2014-05-05 ENCOUNTER — Ambulatory Visit: Payer: PRIVATE HEALTH INSURANCE | Admitting: Podiatry

## 2014-05-11 ENCOUNTER — Telehealth: Payer: Self-pay | Admitting: *Deleted

## 2014-05-11 NOTE — Telephone Encounter (Signed)
I called and informed Olivia [name] that the MRI was approved.  Reference number is [id number].

## 2014-05-11 NOTE — Telephone Encounter (Signed)
Need authorization for the MRI, cpt code is 73718, NPI is [id number].

## 2014-05-11 NOTE — Telephone Encounter (Signed)
Called to get authorization for MRI scheduled for 05/12/2014 at [facility].  I spoke to [place] in regards to clinicals.  MRI was approved, reference number is [id number].

## 2014-05-12 ENCOUNTER — Ambulatory Visit
Admission: RE | Admit: 2014-05-12 | Discharge: 2014-05-12 | Disposition: A | Discharge status: Home / Self Care | Payer: PRIVATE HEALTH INSURANCE | Source: Ambulatory Visit | Attending: Podiatry | Admitting: Podiatry

## 2014-05-12 DIAGNOSIS — S86312D Strain of muscle(s) and tendon(s) of peroneal muscle group at lower leg level, left leg, subsequent encounter: Secondary | ICD-10-CM

## 2014-05-13 ENCOUNTER — Telehealth: Payer: Self-pay | Admitting: *Deleted

## 2014-05-13 NOTE — Telephone Encounter (Signed)
Pt called and was wanting mri results that she had done yesterday. Left message on pts voicemail letting her know we did not have results and once they come in and is reviewed we will call her back.

## 2014-05-17 ENCOUNTER — Telehealth: Payer: Self-pay | Admitting: *Deleted

## 2014-05-17 NOTE — Telephone Encounter (Signed)
I called Friday and yesterday.  I had MRI on Thursday night.  I'd like someone to read the results to me.  I understand you want Dr. Paulla Dolly to interpret them but I'm a Nurse Practitioner.  If you can read them to me, fax or e-mail them to me.  I'll see Dr. Paulla Dolly at my next appointment for his evaluation.  I returned her call.  I apologized for not calling on yesterday, didn't receive a message in my voicemail, not sure where it may have gone.  I informed her that her MRI came back negative, nothing was shown.  She said no fusions or anything.  I told her no.  So there's no reason why my foot hurts all the time.  I guess I will follow up with Dr. Paulla Dolly at my next scheduled appointment.  Thanks for calling me back.

## 2014-05-25 ENCOUNTER — Ambulatory Visit (INDEPENDENT_AMBULATORY_CARE_PROVIDER_SITE_OTHER): Payer: PRIVATE HEALTH INSURANCE | Admitting: Podiatry

## 2014-05-25 ENCOUNTER — Encounter: Payer: Self-pay | Admitting: Podiatry

## 2014-05-25 DIAGNOSIS — M7750 Other enthesopathy of unspecified foot: Secondary | ICD-10-CM

## 2014-05-25 MED ORDER — DIAZEPAM 5 MG PO TABS: 5.0000 mg | ORAL_TABLET | Freq: Two times a day (BID) | ORAL | Status: DC | PRN

## 2014-05-26 ENCOUNTER — Telehealth: Payer: Self-pay | Admitting: *Deleted

## 2014-05-26 NOTE — Progress Notes (Signed)
Subjective:     Patient ID: Olivia [name], female   DOB: Sep 30, 1966, 48 y.o.   MRN: [id number]  HPI patient presents stating that she still is having a lot of problems with her left foot with the tendon on the outside did not seem to be as swollen as previously but she feels like her whole foot hurts her as time and it still seems like it's coming from that area. She also comes to review her new MRI results   Review of Systems     Objective:   Physical Exam Neurovascular status intact with what I would consider mild spasm of the foot left with continued discomfort when I pressed the peroneal tendon proximal to its insertion into the base of the metatarsal fifth left with a diminishment of the swelling that had been occurring    Assessment:     I believe the problem is still the peroneal tendon with probable compensation-like symptoms but difficult to ascertain condition due to the fact that she is in spasm and also has been wearing her boot almost nonstop for the last 8 weeks    Plan:     Reviewed MRI indicating fusiform swelling of the peroneal tendon and discussed returning to normal activity and seen whether or not this tendon fires again with consideration for open debridement of the tendon and possible sewing of the tendon itself. Discussed this case and patient was reviewed with Dr. Jacqualyn Posey. I am sending her for physical therapy to help reduce the spasm present and also to try to reduce the inflammation of the peroneal tendon before we consider more aggressive surgical procedure

## 2014-05-26 NOTE — Telephone Encounter (Signed)
Per Dr. Paulla Dolly, referral was sent to [facility] for physical theapy.  3 times a week for 4 weeks Diagnosis: Enthesopathy of Ankle/ Tarsus Reduce spasms and reduce inflammation of Peroneal Tendon Lt. [place] [address] [place], [place] [address] Ph: [phone]

## 2014-06-05 ENCOUNTER — Other Ambulatory Visit: Payer: Self-pay | Admitting: Internal Medicine

## 2014-06-14 ENCOUNTER — Encounter: Payer: PRIVATE HEALTH INSURANCE | Admitting: Nurse Practitioner

## 2014-06-22 ENCOUNTER — Ambulatory Visit (INDEPENDENT_AMBULATORY_CARE_PROVIDER_SITE_OTHER): Payer: PRIVATE HEALTH INSURANCE | Admitting: Podiatry

## 2014-06-22 VITALS — BP 119/71 | HR 72 | Resp 16

## 2014-06-22 DIAGNOSIS — M7750 Other enthesopathy of unspecified foot: Secondary | ICD-10-CM

## 2014-06-22 NOTE — Progress Notes (Signed)
Subjective:     Patient ID: Olivia [name], female   DOB: June 30, 1966, 48 y.o.   MRN: [id number]  HPI patient states that her foot is improved but still slightly sore with activity but much better than wear was previously   Review of Systems     Objective:   Physical Exam Neurovascular status intact with muscle strength adequate range of motion within normal limits and discomfort that has improved quite a bit on the lateral side of the left foot with the possibility that she may have had some kind of a mild tear which improved her condition    Assessment:     Inflammatory peroneal tendinitis left it's improved    Plan:     Advised on physical therapy immobilization occasionally and be seen back if symptoms were to persist for consideration of exploratory surgery

## 2014-08-02 ENCOUNTER — Ambulatory Visit (INDEPENDENT_AMBULATORY_CARE_PROVIDER_SITE_OTHER): Payer: PRIVATE HEALTH INSURANCE | Admitting: Nurse Practitioner

## 2014-08-02 ENCOUNTER — Encounter: Payer: Self-pay | Admitting: Nurse Practitioner

## 2014-08-02 VITALS — BP 120/77 | HR 61 | Ht 70.0 in | Wt 261.0 lb

## 2014-08-02 DIAGNOSIS — M62838 Other muscle spasm: Secondary | ICD-10-CM

## 2014-08-02 DIAGNOSIS — G43109 Migraine with aura, not intractable, without status migrainosus: Secondary | ICD-10-CM

## 2014-08-02 NOTE — Progress Notes (Signed)
S: Pt in office today for Botox injections. This is her second time to use Botox. The first round took about one week to start working and wore off about 2 weeks early. In between those time she was almost headache free and she was very pleased. Her Botox has been working very well for migraine prevention. She is using 100 units as needed.    O: Alert, oriented, NAD Some muscle spasm in neck and shoulders bilateral   Botox Procedure Note/ Brought her own Botox Lot #  [id number] Expiration Date Jan 2018  Botox Dosing by [employer] for Chronic Migraine   Injection Sites for Migraines  Botox 100 units was injected using the dosage in the table above in the pattern shown above.  A: Migraine  Muscle spasm   P: Botox 100 units injected today.  Advised she may return early if she finds the Botox is wearing off  RTC 3 Months.

## 2014-08-02 NOTE — Patient Instructions (Signed)

## 2014-08-19 ENCOUNTER — Other Ambulatory Visit: Payer: Self-pay

## 2014-08-21 ENCOUNTER — Other Ambulatory Visit: Payer: Self-pay | Admitting: Internal Medicine

## 2014-09-27 ENCOUNTER — Telehealth: Payer: Self-pay | Admitting: Nurse Practitioner

## 2014-09-27 DIAGNOSIS — J45909 Unspecified asthma, uncomplicated: Secondary | ICD-10-CM

## 2014-09-27 MED ORDER — PREDNISONE 20 MG PO TABS
20.0000 mg | ORAL_TABLET | Freq: Three times a day (TID) | ORAL | Status: DC
Start: 2014-09-27 — End: 2015-08-18

## 2014-09-27 NOTE — Telephone Encounter (Signed)
Patient called and requested Prednisone taper for asthma flare

## 2014-10-25 ENCOUNTER — Ambulatory Visit (INDEPENDENT_AMBULATORY_CARE_PROVIDER_SITE_OTHER): Payer: PRIVATE HEALTH INSURANCE | Admitting: Nurse Practitioner

## 2014-10-25 ENCOUNTER — Encounter: Payer: Self-pay | Admitting: Nurse Practitioner

## 2014-10-25 VITALS — BP 117/81 | HR 69 | Ht 70.0 in | Wt 259.0 lb

## 2014-10-25 DIAGNOSIS — E282 Polycystic ovarian syndrome: Secondary | ICD-10-CM

## 2014-10-25 DIAGNOSIS — M62838 Other muscle spasm: Secondary | ICD-10-CM

## 2014-10-25 DIAGNOSIS — G43011 Migraine without aura, intractable, with status migrainosus: Secondary | ICD-10-CM

## 2014-10-25 HISTORY — DX: Polycystic ovarian syndrome: E28.2

## 2014-10-25 NOTE — Patient Instructions (Signed)

## 2014-10-25 NOTE — Progress Notes (Signed)
S: Pt in office today for Botox injections. Her Botox has been working very well for migraine prevention. She is using 100 units as needed. She had the same issues as last time with the Botox wearing off at 2 months and having a daily headache after that. She had 1 severe, 3 moderate and daily mild after Botox wore off. She is under using Imitrex. We discussed medication management and using Botox every 2 months.    O: Alert, oriented, NAD Traps have some tightness/ are better for most part  Botox Procedure Note/ We are using her BOTOX Lot # [id number] Expiration Date April 2018  Botox Dosing by [employer] for Chronic Migraine   Injection Sites for Migraines  Botox 100 units was injected using the dosage in the table above in the pattern shown above.  A: Migraine  Muscle spasm   P: Botox 100 units injected today.  Advised to use Imitrex more frequently RTC 2 Months.

## 2014-12-20 ENCOUNTER — Encounter: Payer: PRIVATE HEALTH INSURANCE | Admitting: Nurse Practitioner

## 2015-04-25 ENCOUNTER — Other Ambulatory Visit: Payer: Self-pay | Admitting: Internal Medicine

## 2015-04-27 ENCOUNTER — Telehealth: Payer: Self-pay

## 2015-04-27 MED ORDER — MONTELUKAST SODIUM 10 MG PO TABS: 10.0000 mg | ORAL_TABLET | Freq: Every day | ORAL | Status: DC

## 2015-04-27 NOTE — Telephone Encounter (Signed)
[facility] - [place], [place] - [address] montelukast (SINGULAIR) 10 MG tablet

## 2015-04-27 NOTE — Telephone Encounter (Signed)
Rx sent to pharmacy   

## 2015-05-30 ENCOUNTER — Other Ambulatory Visit: Payer: Self-pay | Admitting: Endocrinology

## 2015-05-30 DIAGNOSIS — E049 Nontoxic goiter, unspecified: Secondary | ICD-10-CM

## 2015-06-12 ENCOUNTER — Ambulatory Visit
Admission: RE | Admit: 2015-06-12 | Discharge: 2015-06-12 | Disposition: A | Discharge status: Home / Self Care | Payer: 59 | Source: Ambulatory Visit | Attending: Endocrinology | Admitting: Endocrinology

## 2015-06-12 DIAGNOSIS — E049 Nontoxic goiter, unspecified: Secondary | ICD-10-CM

## 2015-07-14 ENCOUNTER — Other Ambulatory Visit: Payer: Self-pay | Admitting: *Deleted

## 2015-07-14 MED ORDER — ALBUTEROL SULFATE HFA 108 (90 BASE) MCG/ACT IN AERS: 2.0000 | INHALATION_SPRAY | Freq: Four times a day (QID) | RESPIRATORY_TRACT | Status: DC | PRN

## 2015-08-18 ENCOUNTER — Telehealth: Payer: Self-pay | Admitting: Internal Medicine

## 2015-08-18 ENCOUNTER — Ambulatory Visit (INDEPENDENT_AMBULATORY_CARE_PROVIDER_SITE_OTHER): Payer: 59 | Admitting: Internal Medicine

## 2015-08-18 ENCOUNTER — Encounter: Payer: Self-pay | Admitting: Internal Medicine

## 2015-08-18 VITALS — BP 120/78 | HR 68 | Temp 98.6°F | Ht 70.0 in | Wt 281.0 lb

## 2015-08-18 DIAGNOSIS — J3089 Other allergic rhinitis: Secondary | ICD-10-CM | POA: Diagnosis not present

## 2015-08-18 DIAGNOSIS — J4520 Mild intermittent asthma, uncomplicated: Secondary | ICD-10-CM

## 2015-08-18 MED ORDER — MONTELUKAST SODIUM 10 MG PO TABS: 10.0000 mg | ORAL_TABLET | Freq: Every day | ORAL | Status: DC

## 2015-08-18 MED ORDER — FLUTICASONE-SALMETEROL 100-50 MCG/DOSE IN AEPB: 1.0000 | INHALATION_SPRAY | Freq: Two times a day (BID) | RESPIRATORY_TRACT | Status: DC

## 2015-08-18 MED ORDER — BECLOMETHASONE DIPROPIONATE 80 MCG/ACT IN AERS: 2.0000 | INHALATION_SPRAY | Freq: Two times a day (BID) | RESPIRATORY_TRACT | Status: DC

## 2015-08-18 MED ORDER — ALBUTEROL SULFATE 108 (90 BASE) MCG/ACT IN AEPB: 2.0000 | INHALATION_SPRAY | Freq: Four times a day (QID) | RESPIRATORY_TRACT | Status: DC | PRN

## 2015-08-18 NOTE — Telephone Encounter (Signed)
For verification purposes you would like pt on both inhalers?

## 2015-08-18 NOTE — Telephone Encounter (Signed)
[place] and spoke with [facility] and she is aware to d/c Advair

## 2015-08-18 NOTE — Patient Instructions (Signed)
Call or return to clinic prn if these symptoms worsen or fail to improve as anticipated.

## 2015-08-18 NOTE — Progress Notes (Signed)
Subjective:    Patient ID: Olivia [name], female    DOB: 15-Nov-1965, 49 y.o.   MRN: [id number]  HPI  49 year old patient who is seen today in follow-up.  She has a history of allergic rhinitis as well as asthma.  She has been treated recently for a daily.  Asthmatic bronchitis and has received oral and parenteral steroids on 3 occasions recently.  She still has some episodic wheezing, dyspnea on exertion and frequent rescue albuterol use.  No prior history of maintenance bronchodilators  Past Medical History  Diagnosis Date  . Allergy   . Reflux   . Contact lens/glasses fitting   . Hiatal hernia   . PONV (postoperative nausea and vomiting)   . Headache(784.0)     hx migraines- hormonal  . Dysrhythmia     PAC's-- related to meds- saw cardio 5 years ago  . Asthma   . Pneumonia 3/13  . Migraine aura without headache   . Environmental allergies     Social History   Social History  . Marital Status: Married    Spouse Name: N/A  . Number of Children: N/A  . Years of Education: N/A   Occupational History  . Not on file.   Social History Main Topics  . Smoking status: Never Smoker   . Smokeless tobacco: Not on file  . Alcohol Use: No  . Drug Use: No  . Sexual Activity: Yes    Birth Control/ Protection: Other-see comments     Comment: Vasectomy in spouse   Other Topics Concern  . Not on file   Social History Narrative    Past Surgical History  Procedure Laterality Date  . Tonsillectomy  1981  . Laparoscopic gastric banding  08/08/08  . Hiatal hernia repair  04/14/2012    Procedure: LAPAROSCOPIC REPAIR OF HIATAL HERNIA;  Surgeon: Pedro Earls, MD;  Location: [facility];  Service: General;  Laterality: N/A;  Hiatal Hernia Repair-Replacement of Lap Band  . Hernia repair  04/14/12    hiatal hernia    Family History  Problem Relation Age of Onset  . Diabetes Mother   . Obesity Mother   . Colon polyps Mother   . Cancer Mother     colon  . Obesity Father   .  Hypertension Father   . Obesity Sister     Allergies  Allergen Reactions  . Cephalosporins Anaphylaxis  . Eggs Or Egg-Derived Products Hives and Nausea And Vomiting    Reaction=angioedema   . Penicillins Anaphylaxis  . Sulfa Drugs Cross Reactors     Liver failure    Current Outpatient Prescriptions on File Prior to Visit  Medication Sig Dispense Refill  . albuterol (PROVENTIL HFA;VENTOLIN HFA) 108 (90 BASE) MCG/ACT inhaler Inhale 2 puffs into the lungs every 6 (six) hours as needed. For shortness of breath 1 Inhaler 1  . cetirizine (ZYRTEC) 10 MG tablet Take 10 mg by mouth at bedtime.     . Cyanocobalamin (B-12) 2500 MCG TABS Take 1 tablet by mouth daily with breakfast.     . montelukast (SINGULAIR) 10 MG tablet Take 1 tablet (10 mg total) by mouth at bedtime. 90 tablet 0  . Multiple Vitamin (MULITIVITAMIN WITH MINERALS) TABS Take 1 tablet by mouth daily.     No current facility-administered medications on file prior to visit.    BP 120/78 mmHg  Pulse 68  Temp(Src) 98.6 F (37 C) (Oral)  Ht 5\' 10"  (1.778 m)  Wt 281 lb (127.461 kg)  BMI 40.32 kg/m2     Review of Systems  Constitutional: Negative.   HENT: Negative for congestion, dental problem, hearing loss, rhinorrhea, sinus pressure, sore throat and tinnitus.   Eyes: Negative for pain, discharge and visual disturbance.  Respiratory: Positive for cough and wheezing. Negative for shortness of breath.   Cardiovascular: Negative for chest pain, palpitations and leg swelling.  Gastrointestinal: Negative for nausea, vomiting, abdominal pain, diarrhea, constipation, blood in stool and abdominal distention.  Genitourinary: Negative for dysuria, urgency, frequency, hematuria, flank pain, vaginal bleeding, vaginal discharge, difficulty urinating, vaginal pain and pelvic pain.  Musculoskeletal: Negative for joint swelling, arthralgias and gait problem.  Skin: Negative for rash.  Neurological: Negative for dizziness, syncope,  speech difficulty, weakness, numbness and headaches.  Hematological: Negative for adenopathy.  Psychiatric/Behavioral: Negative for behavioral problems, dysphoric mood and agitation. The patient is not nervous/anxious.        Objective:   Physical Exam  Constitutional: She is oriented to person, place, and time. She appears well-developed and well-nourished.  HENT:  Head: Normocephalic.  Right Ear: External ear normal.  Left Ear: External ear normal.  Mouth/Throat: Oropharynx is clear and moist.  Eyes: Conjunctivae and EOM are normal. Pupils are equal, round, and reactive to light.  Neck: Normal range of motion. Neck supple. No thyromegaly present.  Cardiovascular: Normal rate, regular rhythm, normal heart sounds and intact distal pulses.   Pulmonary/Chest: Effort normal and breath sounds normal.  Abdominal: Soft. Bowel sounds are normal. She exhibits no mass. There is no tenderness.  Musculoskeletal: Normal range of motion.  Lymphadenopathy:    She has no cervical adenopathy.  Neurological: She is alert and oriented to person, place, and time.  Skin: Skin is warm and dry. No rash noted.  Psychiatric: She has a normal mood and affect. Her behavior is normal.          Assessment & Plan:   Resolving asthmatic bronchitis Chronic asthma.  Not well controlled.  We'll place on Advair Allergic rhinitis

## 2015-08-18 NOTE — Telephone Encounter (Signed)
Olivia [name] from [facility] out pt pharmacy call to say that the following meds beclomethasone (QVAR) 80 MCG/ACT inhaler and Advair are basically the same.  Pharmacy said she don't think pt need to be on both would like a call back .

## 2015-08-18 NOTE — Progress Notes (Signed)
Pre visit review using our clinic review tool, if applicable. No additional management support is needed unless otherwise documented below in the visit note. 

## 2015-08-18 NOTE — Telephone Encounter (Signed)
Please discontinue Advair order

## 2015-08-22 NOTE — Telephone Encounter (Signed)
error 

## 2015-12-18 ENCOUNTER — Other Ambulatory Visit: Payer: Self-pay | Admitting: Internal Medicine

## 2015-12-29 MED FILL — MONTELUKAST SOD 10 MG TAB: 10 | 90 days supply | Qty: 90 | Fill #0

## 2016-02-26 DIAGNOSIS — E282 Polycystic ovarian syndrome: Secondary | ICD-10-CM | POA: Diagnosis not present

## 2016-02-26 DIAGNOSIS — D649 Anemia, unspecified: Secondary | ICD-10-CM | POA: Diagnosis not present

## 2016-02-29 DIAGNOSIS — D649 Anemia, unspecified: Secondary | ICD-10-CM | POA: Diagnosis not present

## 2016-02-29 DIAGNOSIS — E282 Polycystic ovarian syndrome: Secondary | ICD-10-CM | POA: Diagnosis not present

## 2016-03-20 DIAGNOSIS — L820 Inflamed seborrheic keratosis: Secondary | ICD-10-CM | POA: Diagnosis not present

## 2016-03-20 DIAGNOSIS — L718 Other rosacea: Secondary | ICD-10-CM | POA: Diagnosis not present

## 2016-03-20 DIAGNOSIS — L400 Psoriasis vulgaris: Secondary | ICD-10-CM | POA: Diagnosis not present

## 2016-04-16 ENCOUNTER — Other Ambulatory Visit: Payer: Self-pay | Admitting: *Deleted

## 2016-04-16 MED ORDER — MONTELUKAST SODIUM 10 MG PO TABS: ORAL_TABLET | ORAL | Status: DC

## 2016-04-26 MED FILL — METFORMIN HCL ER 500 MG TAB: 500 | 90 days supply | Qty: 90 | Fill #1

## 2016-04-26 MED FILL — MONTELUKAST SOD 10 MG TAB: 10 | 90 days supply | Qty: 90 | Fill #0

## 2016-04-26 MED FILL — metroNIDAZOLE 0.75 % LOTN: 0.75 | 30 days supply | Qty: 59 | Fill #0

## 2016-04-26 MED FILL — CLOBETASOL 0.05% TOPICAL LO: 0.05 | 20 days supply | Qty: 118 | Fill #0

## 2016-05-16 DIAGNOSIS — Z4651 Encounter for fitting and adjustment of gastric lap band: Secondary | ICD-10-CM | POA: Diagnosis not present

## 2016-06-06 DIAGNOSIS — Z4651 Encounter for fitting and adjustment of gastric lap band: Secondary | ICD-10-CM | POA: Diagnosis not present

## 2016-07-01 DIAGNOSIS — R7301 Impaired fasting glucose: Secondary | ICD-10-CM | POA: Diagnosis not present

## 2016-07-01 DIAGNOSIS — D509 Iron deficiency anemia, unspecified: Secondary | ICD-10-CM | POA: Diagnosis not present

## 2016-07-01 DIAGNOSIS — E282 Polycystic ovarian syndrome: Secondary | ICD-10-CM | POA: Diagnosis not present

## 2016-07-01 MED FILL — PHENTERMINE 15 MG CAPSULE: 15 | 30 days supply | Qty: 30 | Fill #0

## 2016-07-05 ENCOUNTER — Other Ambulatory Visit (INDEPENDENT_AMBULATORY_CARE_PROVIDER_SITE_OTHER): Payer: 59

## 2016-07-05 DIAGNOSIS — Z0000 Encounter for general adult medical examination without abnormal findings: Secondary | ICD-10-CM

## 2016-07-05 LAB — BASIC METABOLIC PANEL
BUN: 12 mg/dL (ref 6–23)
CHLORIDE: 107 meq/L (ref 96–112)
CO2: 25 meq/L (ref 19–32)
CREATININE: 0.73 mg/dL (ref 0.40–1.20)
Calcium: 9 mg/dL (ref 8.4–10.5)
GFR: 90.31 mL/min (ref >60.00)
Glucose, Bld: 92 mg/dL (ref 70–99)
POTASSIUM: 4.3 meq/L (ref 3.5–5.1)
Sodium: 139 mEq/L (ref 135–145)

## 2016-07-05 LAB — POC URINALSYSI DIPSTICK (AUTOMATED)
BILIRUBIN UA: NEGATIVE
Glucose, UA: NEGATIVE
KETONES UA: NEGATIVE
LEUKOCYTES UA: NEGATIVE
Nitrite, UA: NEGATIVE
PH UA: 6.5
Protein, UA: NEGATIVE
Spec Grav, UA: 1.02
Urobilinogen, UA: 0.2

## 2016-07-05 LAB — CBC WITH DIFFERENTIAL/PLATELET
BASOS PCT: 1.1 % (ref 0.0–3.0)
Basophils Absolute: 0.1 10*3/uL (ref 0.0–0.1)
EOS PCT: 3.3 % (ref 0.0–5.0)
Eosinophils Absolute: 0.2 10*3/uL (ref 0.0–0.7)
HCT: 36 % (ref 36.0–46.0)
HEMOGLOBIN: 11.9 g/dL — AB (ref 12.0–15.0)
LYMPHS PCT: 28.7 % (ref 12.0–46.0)
Lymphs Abs: 1.7 10*3/uL (ref 0.7–4.0)
MCHC: 32.9 g/dL (ref 30.0–36.0)
MCV: 71.2 fl — ABNORMAL LOW (ref 78.0–100.0)
MONOS PCT: 6.8 % (ref 3.0–12.0)
Monocytes Absolute: 0.4 10*3/uL (ref 0.1–1.0)
NEUTROS ABS: 3.6 10*3/uL (ref 1.4–7.7)
NEUTROS PCT: 60.1 % (ref 43.0–77.0)
Platelets: 310 10*3/uL (ref 150.0–400.0)
RBC: 5.06 Mil/uL (ref 3.87–5.11)
RDW: 17.6 % — ABNORMAL HIGH (ref 11.5–15.5)
WBC: 6 10*3/uL (ref 4.0–10.5)

## 2016-07-05 LAB — HEPATIC FUNCTION PANEL
ALK PHOS: 53 U/L (ref 39–117)
ALT: 9 U/L (ref 0–35)
AST: 15 U/L (ref 0–37)
Albumin: 3.8 g/dL (ref 3.5–5.2)
BILIRUBIN TOTAL: 0.5 mg/dL (ref 0.2–1.2)
Bilirubin, Direct: 0.1 mg/dL (ref 0.0–0.3)
Total Protein: 6.7 g/dL (ref 6.0–8.3)

## 2016-07-05 LAB — LIPID PANEL
CHOLESTEROL: 216 mg/dL — AB (ref 0–200)
HDL: 57.3 mg/dL (ref >39.00)
LDL CALC: 141 mg/dL — AB (ref 0–99)
NONHDL: 159.18
Total CHOL/HDL Ratio: 4
Triglycerides: 91 mg/dL (ref 0.0–149.0)
VLDL: 18.2 mg/dL (ref 0.0–40.0)

## 2016-07-05 LAB — TSH: TSH: 1.01 u[IU]/mL (ref 0.35–4.50)

## 2016-07-12 ENCOUNTER — Ambulatory Visit (INDEPENDENT_AMBULATORY_CARE_PROVIDER_SITE_OTHER): Payer: 59 | Admitting: Internal Medicine

## 2016-07-12 ENCOUNTER — Encounter: Payer: Self-pay | Admitting: Internal Medicine

## 2016-07-12 VITALS — BP 120/78 | HR 83 | Temp 98.3°F | Resp 20 | Ht 67.0 in | Wt 296.5 lb

## 2016-07-12 DIAGNOSIS — F4321 Adjustment disorder with depressed mood: Secondary | ICD-10-CM

## 2016-07-12 DIAGNOSIS — Z0000 Encounter for general adult medical examination without abnormal findings: Secondary | ICD-10-CM

## 2016-07-12 MED ORDER — BUPROPION HCL ER (XL) 150 MG PO TB24: 150.0000 mg | ORAL_TABLET | Freq: Every day | ORAL | 0 refills | Status: DC

## 2016-07-12 MED ORDER — BUPROPION HCL ER (XL) 300 MG PO TB24: 300.0000 mg | ORAL_TABLET | Freq: Every day | ORAL | 3 refills | Status: DC

## 2016-07-12 MED FILL — BUPROPION HCL XL 150 MG TAB: 150 | 4 days supply | Qty: 4 | Fill #0

## 2016-07-12 MED FILL — BUPROPION HCL XL 300 MG TAB: 300 | 90 days supply | Qty: 90 | Fill #0

## 2016-07-12 NOTE — Patient Instructions (Addendum)
It is important that you exercise regularly, at least 20 minutes 3 to 4 times per week.  If you develop chest pain or shortness of breath seek  medical attention.  You need to lose weight.  Consider a lower calorie diet and regular exercise.  Return in 3 months for follow-up

## 2016-07-12 NOTE — Progress Notes (Signed)
Subjective:    Patient ID: Olivia [name], female    DOB: [birth year], 50 y.o.   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  She is followed by gynecology. .  She has been followed by a counselor since January of this year.  She has some significant anxiety and some depression.  She has been seen by gynecology with perimenopausal symptoms and has been treated for significant iron deficiency anemia.  She complains of increasing work related stress, as well as lethargy.  She has obesity and has been working on her weight.  She has a history of asthma and allergic rhinitis.  She is followed by OB/GYN due to polycystic ovarian syndrome  Past Medical History:  Diagnosis Date  . Allergy   . Asthma   . Contact lens/glasses fitting   . Dysrhythmia    PAC's-- related to meds- saw cardio 5 years ago  . Environmental allergies   . Headache(784.0)    hx migraines- hormonal  . Hiatal hernia   . Migraine aura without headache   . Pneumonia 3/13  . PONV (postoperative nausea and vomiting)   . Reflux      Social History   Social History  . Marital status: Married    Spouse name: N/A  . Number of children: N/A  . Years of education: N/A   Occupational History  . Not on file.   Social History Main Topics  . Smoking status: Never Smoker  . Smokeless tobacco: Not on file  . Alcohol use No  . Drug use: No  . Sexual activity: Yes    Birth control/ protection: Other-see comments     Comment: Vasectomy in spouse   Other Topics Concern  . Not on file   Social History Narrative  . No narrative on file    Past Surgical History:  Procedure Laterality Date  . HERNIA REPAIR  04/14/12   hiatal hernia  . HIATAL HERNIA REPAIR  04/14/2012   Procedure: LAPAROSCOPIC REPAIR OF HIATAL HERNIA;  Surgeon: Pedro Earls, MD;  Location: [facility];  Service: General;  Laterality: N/A;  Hiatal Hernia Repair-Replacement of Lap Band  .  LAPAROSCOPIC GASTRIC BANDING  08/08/08  . TONSILLECTOMY  1981    Family History  Problem Relation Age of Onset  . Diabetes Mother   . Obesity Mother   . Colon polyps Mother   . Cancer Mother     colon  . Obesity Father   . Hypertension Father   . Obesity Sister     Allergies  Allergen Reactions  . Cephalosporins Anaphylaxis  . Eggs Or Egg-Derived Products Hives and Nausea And Vomiting    Reaction=angioedema   . Penicillins Anaphylaxis  . Sulfa Drugs Cross Reactors     Liver failure    Current Outpatient Prescriptions on File Prior to Visit  Medication Sig Dispense Refill  . albuterol (PROVENTIL HFA;VENTOLIN HFA) 108 (90 BASE) MCG/ACT inhaler Inhale 2 puffs into the lungs every 6 (six) hours as needed. For shortness of breath 1 Inhaler 1  . Albuterol Sulfate (PROAIR RESPICLICK) 123XX123 (90 BASE) MCG/ACT AEPB Inhale 2 Inhalers into the lungs 4 (four) times daily as needed. 1 each 3  . beclomethasone (QVAR) 80 MCG/ACT inhaler Inhale 2 puffs into the lungs 2 (two) times daily. 1 Inhaler 12  . cetirizine (ZYRTEC) 10 MG tablet Take 10 mg by mouth at bedtime.     Marland Kitchen  montelukast (SINGULAIR) 10 MG tablet TAKE 1 TABLET (10 MG TOTAL) BY MOUTH AT BEDTIME. 90 tablet 1  . Multiple Vitamin (MULITIVITAMIN WITH MINERALS) TABS Take 1 tablet by mouth daily.     No current facility-administered medications on file prior to visit.     BP 120/78 (BP Location: Left Arm, Patient Position: Sitting, Cuff Size: Large)   Pulse 83   Temp 98.3 F (36.8 C) (Oral)   Resp 20   Ht 5\' 7"  (1.702 m)   Wt 296 lb 8 oz (134.5 kg)   LMP 07/03/2016   SpO2 98%   BMI 46.44 kg/m     Review of Systems  Constitutional: Positive for fatigue and unexpected weight change. Negative for appetite change and fever.  HENT: Negative for congestion, dental problem, ear pain, hearing loss, mouth sores, nosebleeds, sinus pressure, sore throat, tinnitus, trouble swallowing and voice change.   Eyes: Negative for photophobia,  pain, redness and visual disturbance.  Respiratory: Negative for cough, chest tightness and shortness of breath.   Cardiovascular: Negative for chest pain, palpitations and leg swelling.  Gastrointestinal: Negative for abdominal distention, abdominal pain, blood in stool, constipation, diarrhea, nausea, rectal pain and vomiting.  Genitourinary: Negative for difficulty urinating, dysuria, flank pain, frequency, genital sores, hematuria, menstrual problem, pelvic pain, urgency, vaginal bleeding, vaginal discharge and vaginal pain.  Musculoskeletal: Negative for arthralgias, back pain and neck stiffness.  Skin: Negative for rash.  Neurological: Negative for dizziness, syncope, speech difficulty, weakness, light-headedness, numbness and headaches.  Hematological: Negative for adenopathy. Does not bruise/bleed easily.  Psychiatric/Behavioral: Positive for dysphoric mood. Negative for agitation, behavioral problems, self-injury and suicidal ideas. The patient is not nervous/anxious.        Objective:   Physical Exam  Constitutional: She is oriented to person, place, and time. She appears well-developed and well-nourished.  Weight 297 Blood pressure 120/78  HENT:  Head: Normocephalic.  Right Ear: External ear normal.  Left Ear: External ear normal.  Mouth/Throat: Oropharynx is clear and moist.  Eyes: Conjunctivae and EOM are normal. Pupils are equal, round, and reactive to light.  Neck: Normal range of motion. Neck supple. No thyromegaly present.  Cardiovascular: Normal rate, regular rhythm, normal heart sounds and intact distal pulses.   Pulmonary/Chest: Effort normal and breath sounds normal.  Abdominal: Soft. Bowel sounds are normal. She exhibits no mass. There is no tenderness.  Musculoskeletal: Normal range of motion.  Lymphadenopathy:    She has no cervical adenopathy.  Neurological: She is alert and oriented to person, place, and time.  Skin: Skin is warm and dry. No rash noted.    Psychiatric: She has a normal mood and affect. Her behavior is normal.          Assessment & Plan:   , preventive examination .  Adjustment disorder with neck anxiety and depressed mood.  Continue counseling.  Start  Bupropion.  Follow-up 6 weeks BMI chart in 3 months.  Office follow-up  exogenous obesity.  Weight loss exercise, proper diet.  All discussed and encouraged .  Asthma/, allergic rhinitis, stable.  No change in therapy  .  Recheck 3 months  Nyoka Cowden

## 2016-07-12 NOTE — Progress Notes (Signed)
Pre visit review using our clinic review tool, if applicable. No additional management support is needed unless otherwise documented below in the visit note. 

## 2016-08-07 ENCOUNTER — Encounter (HOSPITAL_COMMUNITY): Payer: Self-pay

## 2016-08-07 DIAGNOSIS — Z6841 Body Mass Index (BMI) 40.0 and over, adult: Secondary | ICD-10-CM | POA: Diagnosis not present

## 2016-08-07 DIAGNOSIS — Z01419 Encounter for gynecological examination (general) (routine) without abnormal findings: Secondary | ICD-10-CM | POA: Diagnosis not present

## 2016-08-07 DIAGNOSIS — Z3200 Encounter for pregnancy test, result unknown: Secondary | ICD-10-CM | POA: Diagnosis not present

## 2016-08-07 DIAGNOSIS — N938 Other specified abnormal uterine and vaginal bleeding: Secondary | ICD-10-CM | POA: Diagnosis not present

## 2016-08-07 DIAGNOSIS — Z1231 Encounter for screening mammogram for malignant neoplasm of breast: Secondary | ICD-10-CM | POA: Diagnosis not present

## 2016-08-07 DIAGNOSIS — N898 Other specified noninflammatory disorders of vagina: Secondary | ICD-10-CM | POA: Diagnosis not present

## 2016-08-07 MED FILL — MEDROXYPROGESTERONE 10 MG T: 10 | 10 days supply | Qty: 20 | Fill #0

## 2016-08-07 MED FILL — TINIDAZOLE 500 MG TABLET: 500 | 5 days supply | Qty: 10 | Fill #0

## 2016-09-06 DIAGNOSIS — N938 Other specified abnormal uterine and vaginal bleeding: Secondary | ICD-10-CM | POA: Diagnosis not present

## 2016-09-23 DIAGNOSIS — Z3202 Encounter for pregnancy test, result negative: Secondary | ICD-10-CM | POA: Diagnosis not present

## 2016-09-23 DIAGNOSIS — Z30430 Encounter for insertion of intrauterine contraceptive device: Secondary | ICD-10-CM | POA: Diagnosis not present

## 2016-10-01 MED FILL — MONTELUKAST SOD 10 MG TAB: 10 | 90 days supply | Qty: 90 | Fill #1

## 2016-10-04 ENCOUNTER — Encounter: Payer: Self-pay | Admitting: Physician Assistant

## 2016-10-04 ENCOUNTER — Ambulatory Visit (INDEPENDENT_AMBULATORY_CARE_PROVIDER_SITE_OTHER): Payer: 59 | Admitting: Physician Assistant

## 2016-10-04 VITALS — BP 129/86 | HR 83 | Resp 18 | Ht 68.0 in | Wt 299.0 lb

## 2016-10-04 DIAGNOSIS — M62838 Other muscle spasm: Secondary | ICD-10-CM | POA: Diagnosis not present

## 2016-10-04 DIAGNOSIS — H5213 Myopia, bilateral: Secondary | ICD-10-CM | POA: Diagnosis not present

## 2016-10-04 DIAGNOSIS — G43101 Migraine with aura, not intractable, with status migrainosus: Secondary | ICD-10-CM

## 2016-10-04 DIAGNOSIS — G44209 Tension-type headache, unspecified, not intractable: Secondary | ICD-10-CM

## 2016-10-04 MED ORDER — SUMATRIPTAN SUCCINATE 100 MG PO TABS: 100.0000 mg | ORAL_TABLET | Freq: Once | ORAL | 11 refills | Status: DC | PRN

## 2016-10-04 MED ORDER — TIZANIDINE HCL 4 MG PO TABS: 4.0000 mg | ORAL_TABLET | Freq: Four times a day (QID) | ORAL | 1 refills | Status: DC | PRN

## 2016-10-04 MED ORDER — VENLAFAXINE HCL ER 37.5 MG PO CP24: 37.5000 mg | ORAL_CAPSULE | Freq: Every day | ORAL | 2 refills | Status: DC

## 2016-10-04 MED FILL — VENLAFAXINE HCL ER 37.5 MG: 37.5 | 30 days supply | Qty: 30 | Fill #0

## 2016-10-04 MED FILL — tiZANidine HCL 4 MG TABS: 4 | 7 days supply | Qty: 30 | Fill #0

## 2016-10-04 MED FILL — SUMATRIPTAN SUCC 100 MG TAB: 100 | 30 days supply | Qty: 9 | Fill #0

## 2016-10-04 NOTE — Patient Instructions (Signed)
Trigger Point Injection Trigger points are areas where you have pain. A trigger point injection is a shot given in the trigger point to help relieve pain for a few days to a few months. Common places for trigger points include:  The neck.  The shoulders.  The upper back.  The lower back. A trigger point injection will not cure long-lasting (chronic) pain permanently. These injections do not always work for every person, but for some people they can help to relieve pain for a few days to a few months. Tell a health care provider about:  Any allergies you have.  All medicines you are taking, including vitamins, herbs, eye drops, creams, and over-the-counter medicines.  Any problems you or family members have had with anesthetic medicines.  Any blood disorders you have.  Any surgeries you have had.  Any medical conditions you have. What are the risks? Generally, this is a safe procedure. However, problems may occur, including:  Infection.  Bleeding.  Allergic reaction to the injected medicine.  Irritation of the skin around the injection site. What happens before the procedure?  Ask your health care provider about changing or stopping your regular medicines. This is especially important if you are taking diabetes medicines or blood thinners. What happens during the procedure?  Your health care provider will feel for trigger points. A marker may be used to circle the area for the injection.  The skin over the trigger point will be washed with a germ-killing (antiseptic) solution.  A thin needle is used for the shot. You may feel pain or a twitching feeling when the needle enters the trigger point.  A numbing solution may be injected into the trigger point. Sometimes a medicine to keep down swelling, redness, and warmth (inflammation) is also injected.  Your health care provider may move the needle around the area where the trigger point is located until the tightness and  twitching goes away.  After the injection, your health care provider may put gentle pressure over the injection site.  The injection site will be covered with a bandage (dressing). The procedure may vary among health care providers and hospitals. What happens after the procedure?  The dressing can be taken off in a few hours or as told by your health care provider.  You may feel sore and stiff for 1-2 days. This information is not intended to replace advice given to you by your health care provider. Make sure you discuss any questions you have with your health care provider. Document Released: 10/10/2011 Document Revised: 06/23/2016 Document Reviewed: 04/10/2015 Elsevier Interactive Patient Education  2017 [employer]  

## 2016-10-04 NOTE — Progress Notes (Signed)
History:  Olivia [name] is a 50 y.o. No obstetric history on file. who presents to clinic today for migraine/muscle spasm.  She had previously seen Olivia [name] and was unsure about seeing a new provider.  She has now gotten to the point, she had to do something different.  She has previously used Botox and found this helpful.  She is not yet sure she needs to pursue this option.   She had trialled Wellbutrin a few months ago for anxiety/depression and found that it increased her HA's.  She has now discontinued and did not find it helpful.   She continues to have issues with hormonal changes.  She has a h/o PCOS and has been bleeding more over the last year.  Just recently, she had a Liletta IUD placed to help minimize the bleeding.   She notes a continuous skull cap HA for 2 straight weeks and she indicates her neck and shoulders are always in spasm.   She has run out of zanaflex which was previously helpful with muscle spasm and sleep - taking only 1/4 to 1/2 of 4mg  tab.   Excedrin is some help for acute HA as well as triptan (thought to be sumatriptan).    HIT6:54 Number of days in the last 4 weeks with:  Severe headache: 3 Moderate headache: 15 Mild headache: 8  No headache: 2   Past Medical History:  Diagnosis Date  . Allergy   . Asthma   . Contact lens/glasses fitting   . Dysrhythmia    PAC's-- related to meds- saw cardio 5 years ago  . Environmental allergies   . Headache(784.0)    hx migraines- hormonal  . Hiatal hernia   . Migraine aura without headache   . Pneumonia 3/13  . PONV (postoperative nausea and vomiting)   . Reflux     Social History   Social History  . Marital status: Married    Spouse name: N/A  . Number of children: N/A  . Years of education: N/A   Occupational History  . Not on file.   Social History Main Topics  . Smoking status: Never Smoker  . Smokeless tobacco: Not on file  . Alcohol use No  . Drug use: No  . Sexual activity: Yes   Birth control/ protection: Other-see comments     Comment: Vasectomy in spouse   Other Topics Concern  . Not on file   Social History Narrative  . No narrative on file    Family History  Problem Relation Age of Onset  . Diabetes Mother   . Obesity Mother   . Colon polyps Mother   . Cancer Mother     colon  . Obesity Father   . Hypertension Father   . Obesity Sister     Allergies  Allergen Reactions  . Cephalosporins Anaphylaxis  . Eggs Or Egg-Derived Products Hives and Nausea And Vomiting    Reaction=angioedema   . Penicillins Anaphylaxis  . Sulfa Drugs Cross Reactors     Liver failure    Current Outpatient Prescriptions on File Prior to Visit  Medication Sig Dispense Refill  . albuterol (PROVENTIL HFA;VENTOLIN HFA) 108 (90 BASE) MCG/ACT inhaler Inhale 2 puffs into the lungs every 6 (six) hours as needed. For shortness of breath 1 Inhaler 1  . Albuterol Sulfate (PROAIR RESPICLICK) 123XX123 (90 BASE) MCG/ACT AEPB Inhale 2 Inhalers into the lungs 4 (four) times daily as needed. 1 each 3  . beclomethasone (QVAR) 80 MCG/ACT inhaler Inhale  2 puffs into the lungs 2 (two) times daily. 1 Inhaler 12  . betamethasone dipropionate (DIPROLENE) 0.05 % cream Apply topically 2 (two) times daily. APPLY TO AREAS OF SKIN AS NEEDED    . buPROPion (WELLBUTRIN XL) 150 MG 24 hr tablet Take 1 tablet (150 mg total) by mouth daily. 4 tablet 0  . buPROPion (WELLBUTRIN XL) 300 MG 24 hr tablet Take 1 tablet (300 mg total) by mouth daily. 90 tablet 3  . calcipotriene (DOVONOX) 0.005 % cream Apply topically 2 (two) times daily. APPLY TO SKIN AS NEEDED    . cetirizine (ZYRTEC) 10 MG tablet Take 10 mg by mouth at bedtime.     . Cholecalciferol (VITAMIN D) 2000 units CAPS Take 1 capsule by mouth daily.    . Clobetasol Propionate 0.05 % lotion APPLY LOTION TO AREAS TWICE A WEEK  0  . METRONIDAZOLE, TOPICAL, 0.75 % LOTN   6  . montelukast (SINGULAIR) 10 MG tablet TAKE 1 TABLET (10 MG TOTAL) BY MOUTH AT  BEDTIME. 90 tablet 1  . Multiple Vitamin (MULITIVITAMIN WITH MINERALS) TABS Take 1 tablet by mouth daily.     No current facility-administered medications on file prior to visit.      Review of Systems:  All pertinent positive/negative included in HPI, all other review of systems are negative   Objective:  Physical Exam BP 129/86 (BP Location: Left Arm, Patient Position: Sitting, Cuff Size: Large)   Pulse 83   Resp 18   Ht 5\' 8"  (1.727 m)   Wt 299 lb (135.6 kg)   LMP 09/30/2016   BMI 45.46 kg/m  CONSTITUTIONAL: Well-developed, well-nourished female in no acute distress.  EYES: EOM intact ENT: Normocephalic CARDIOVASCULAR: Regular rate and rhythm with no adventitious sounds.  RESPIRATORY: Normal rate. Clear to auscultation bilaterally.   MUSCULOSKELETAL: Normal ROM, strength equal bilaterally, significant muscle spasm noted throughout cervical paraspinal muscles, trapezius and upper back SKIN: Warm, dry without erythema  NEUROLOGICAL: Alert, oriented, CN II-XII grossly intact, Appropriate balance PSYCH: Normal behavior, mood  PROCEDURE:  TRIGGER POINT INJECTIONS  Procedure: Mixture of 1%  Lidocaine, marcaine and dexamethazone in a ratio of 2:2:1  injected with 1cc each site in bilateral greater Occipital Nerves, bilateral lesser occipital nerves, bilateral cervical paraspinal muscles, bilateral trapezius, muscles in spasm across the upper back.  Total amount injected: 15cc.  Pt tolerated procedure well.   Assessment & Plan:  Assessment: 1. Migraine with aura and with status migrainosus, not intractable   2. Muscle spasm   3. Tension headache      Plan: TPI done today to break current cycle zanaflex tonite and as needed Sumatriptan prn migraine - call if she discovers this is not the correct triptan she is accustomed to using Effexor - begin with 37.5mg  qam (after a few weeks to see what her new baseline is).  May need to increase dosing as tolerated and as needed.  Pt  advised of possible increase in BP with this medication as well as possible complications if the medication is not used regularly.   Follow-up in 3 months or sooner PRN  Olivia Stack, PA-C 10/04/2016 10:57 AM

## 2016-10-15 ENCOUNTER — Encounter: Payer: Self-pay | Admitting: Physical Therapy

## 2016-10-15 ENCOUNTER — Ambulatory Visit: Payer: PRIVATE HEALTH INSURANCE | Attending: Internal Medicine | Admitting: Physical Therapy

## 2016-10-15 ENCOUNTER — Ambulatory Visit: Payer: PRIVATE HEALTH INSURANCE | Admitting: Physical Therapy

## 2016-10-15 DIAGNOSIS — M25612 Stiffness of left shoulder, not elsewhere classified: Secondary | ICD-10-CM | POA: Insufficient documentation

## 2016-10-15 DIAGNOSIS — M6281 Muscle weakness (generalized): Secondary | ICD-10-CM | POA: Insufficient documentation

## 2016-10-15 DIAGNOSIS — M25512 Pain in left shoulder: Secondary | ICD-10-CM | POA: Insufficient documentation

## 2016-10-15 NOTE — Therapy (Signed)
[place], [place], [address] Phone: [phone]   Fax:  [phone]  Physical Therapy Evaluation  Patient Details  Name: Olivia [name] MRN: [id number] Date of Birth: 1965-12-31 Referring Provider: Dr Dannial Monarch  Encounter Date: 10/15/2016      PT End of Session - 10/15/16 1539    Visit Number 1   Number of Visits 8   Date for PT Re-Evaluation 12/10/16   Authorization Type [facility]    Authorization - Visit Number 8   PT Start Time [id number]   PT Stop Time 1318   PT Time Calculation (min) 48 min   Activity Tolerance Patient tolerated treatment well   Behavior During Therapy [facility] for tasks assessed/performed      Past Medical History:  Diagnosis Date  . Allergy   . Asthma   . Contact lens/glasses fitting   . Dysrhythmia    PAC's-- related to meds- saw cardio 5 years ago  . Environmental allergies   . Headache(784.0)    hx migraines- hormonal  . Hiatal hernia   . Migraine aura without headache   . Pneumonia 3/13  . PONV (postoperative nausea and vomiting)   . Reflux     Past Surgical History:  Procedure Laterality Date  . HERNIA REPAIR  04/14/12   hiatal hernia  . HIATAL HERNIA REPAIR  04/14/2012   Procedure: LAPAROSCOPIC REPAIR OF HIATAL HERNIA;  Surgeon: Pedro Earls, MD;  Location: [facility];  Service: General;  Laterality: N/A;  Hiatal Hernia Repair-Replacement of Lap Band  . LAPAROSCOPIC GASTRIC BANDING  08/08/08  . TONSILLECTOMY  1981    There were no vitals filed for this visit.       Subjective Assessment - 10/15/16 1245    Subjective Patient had her flu shot in the beggining of October. She flet pain in her anterior shoulder. Over time the pain remained and became worse. She has significant pain in the right anterior shoulder., She has some difficulty lifting her shoiulder over her head.    Pertinent History Scoliosis    Limitations Lifting;House hold activities   Diagnostic tests  Nothing at this time    Patient Stated Goals To decrease pain with the use of the left arm    Currently in Pain? Yes   Pain Score 8    Pain Location Shoulder   Pain Orientation Left   Pain Descriptors / Indicators Aching   Pain Type Acute pain   Pain Onset More than a month ago   Pain Frequency Intermittent   Aggravating Factors  horizontal abduction    Pain Relieving Factors allieve, ben-gay   Effect of Pain on Daily Activities difficulty using the left    Multiple Pain Sites No            [facility] PT Assessment - 10/15/16 0001      Assessment   Medical Diagnosis Left anteriro shoulder pain/ Impingement    Referring Provider Dr Dannial Monarch   Onset Date/Surgical Date 08/15/16   Hand Dominance Left   Next MD Visit 2 weeks    Prior Therapy None      Precautions   Precautions None     Restrictions   Weight Bearing Restrictions No     Balance Screen   Has the patient fallen in the past 6 months No     Home Environment   Additional Comments Nothing pertinent      Prior Function   Level of Independence  Independent   Vocation Full time employment   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      Cognition   Overall Cognitive Status Within Functional Limits for tasks assessed   Attention Focused   Focused Attention Appears intact   Memory Appears intact   Awareness Appears intact   Problem Solving Appears intact     Observation/Other Assessments   Observations left shoulder elevation   Focus on Therapeutic Outcomes (FOTO)  41% limitation      Sensation   Additional Comments Denies paratheisas      Coordination   Gross Motor Movements are Fluid and Coordinated Yes   Fine Motor Movements are Fluid and Coordinated Yes     ROM / Strength   AROM / PROM / Strength PROM;AROM;Strength     AROM   Overall AROM Comments 85 degrees of L shoulder pain before she feels pain but can lift to 140 with pain; functional ER pain behind the head; Pain at L5 with functional IR;       PROM   Overall PROM Comments Full PROm but pain with end range left flexion and scaption.      Strength   Strength Assessment Site Shoulder   Right/Left Shoulder Right;Left   Right Shoulder Flexion 5/5   Right Shoulder Extension 5/5   Right Shoulder ABduction 5/5   Right Shoulder Internal Rotation 5/5   Right Shoulder External Rotation 5/5   Left Shoulder Flexion 4/5   Left Shoulder ABduction 4/5   Left Shoulder Internal Rotation 4+/5   Left Shoulder External Rotation 4/5   Right/Left Hip Right;Left     Palpation   Palpation comment significant tendenress to palpation in the biceps groove     Special Tests    Special Tests --  speeds (+) empty can (+) Hawkins (+) Drop arm (-)                    [facility] Adult PT Treatment/Exercise - 10/15/16 0001      Shoulder Exercises: Standing   Other Standing Exercises shoulder extnesion 2x10 red; scapular retraction 2x10 red      Modalities   Modalities Iontophoresis     Iontophoresis   Type of Iontophoresis Dexamethasone   Location left biceps tendon   Dose 1cc    Time slow release      Manual Therapy   Manual therapy comments Grade 1 and 2 PA and AP mobilizations to decrease pain and improve movement in the capsule.                 PT Education - 10/15/16 1538    Education provided Yes   Education Details Patinet given scapular stregthening exercses and educated on the reasoning. Patient educated on HEP and iontophoresis   Person(s) Educated Patient   Methods Explanation   Comprehension Verbalized understanding;Returned demonstration          PT Short Term Goals - 10/15/16 1630      PT SHORT TERM GOAL #1   Title Patient will increase active flexion by 20 degrees on the left    Time 4   Period Weeks   Status New     PT SHORT TERM GOAL #2   Title Patient will increase gross right shoulder strength to 5/5    Time 4   Period Weeks   Status New     PT SHORT TERM GOAL #3   Title Patient will be  independent with HEP    Time  4   Period Weeks   Status New     PT SHORT TERM GOAL #4   Title Patient will report 2/10 pain at worst    Time 4   Period Weeks   Status New           PT Long Term Goals - 10/15/16 1633      PT LONG TERM GOAL #1   Title Patient wil demsotrate full functional external rotation without pain in order to do her hair   Time 8   Period Weeks   Status New     PT LONG TERM GOAL #2   Title Patient will reach behind her back without pain with left arm    Time 8   Period Weeks   Status New     PT LONG TERM GOAL #3   Title Patient will reach into cabinet to grab a 2lb object    Time 8   Period Weeks   Status New     PT LONG TERM GOAL #4   Title Patients FOTO score will be less then 36% disability    Time 8   Period Weeks   Status New               Plan - 10/15/16 1540    Clinical Impression Statement Patient is a 50 year old female who presents with right anterior shoulder pain. Signs and objective measures are consistent with diagnosis of left shoulder impingement and likely bicpes tendinitis. She has pain with functional internal and external rotation. She has weakness in the left shoulder.    Rehab Potential Good   PT Frequency 2x / week   PT Duration 8 weeks   PT Treatment/Interventions ADLs/Self Care Home Management;Iontophoresis 4mg /ml Dexamethasone;Ultrasound;Moist Heat;Cryotherapy;Electrical Stimulation;Gait training;Stair training;Neuromuscular re-education;Therapeutic exercise;Therapeutic activities;Patient/family education;Manual techniques;Dry needling;Energy conservation;Taping;Functional mobility training   PT Next Visit Plan continue iontpo, consider ultra sound, continue Grade I and II mobilizations, Add ER/ IR strengthening as tolerated, consdier stabilization stregthening, consider UE range for flexion,    PT Home Exercise Plan scapular retraction, shoulder extension   Recommended Other Services     Consulted and Agree  with Plan of Care Patient      Patient will benefit from skilled therapeutic intervention in order to improve the following deficits and impairments:  Decreased range of motion, Pain, Impaired UE functional use, Decreased activity tolerance, Decreased strength, Postural dysfunction  Visit Diagnosis: Acute pain of left shoulder - Plan: PT plan of care cert/re-cert  Muscle weakness (generalized) - Plan: PT plan of care cert/re-cert  Stiffness of left shoulder, not elsewhere classified - Plan: PT plan of care cert/re-cert     Problem List Patient Active Problem List   Diagnosis Date Noted  . Muscle spasm 10/04/2016  . Tension headache 10/04/2016  . PCOS (polycystic ovarian syndrome) 10/25/2014  . Migraine with aura 01/11/2014  . Muscle spasms of neck 01/11/2014  . Lapband APS resited with [facility] repair June 2013 05/08/2012  . HYPERLIPIDEMIA 04/15/2008  . OBESITY 04/15/2008  . Allergic rhinitis 04/15/2008  . Asthma 04/15/2008    Carney Living  PT DPT  10/15/2016, 4:41 PM  [facility] [address] [place], [place], [address] Phone: [phone]   Fax:  [phone]  Name: Olivia [name] MRN: [id number] Date of Birth: [birth year]

## 2016-10-21 DIAGNOSIS — Z30431 Encounter for routine checking of intrauterine contraceptive device: Secondary | ICD-10-CM | POA: Diagnosis not present

## 2016-10-23 ENCOUNTER — Ambulatory Visit: Payer: PRIVATE HEALTH INSURANCE | Admitting: Physical Therapy

## 2016-10-25 ENCOUNTER — Ambulatory Visit: Payer: PRIVATE HEALTH INSURANCE | Admitting: Physical Therapy

## 2016-10-29 ENCOUNTER — Ambulatory Visit: Payer: PRIVATE HEALTH INSURANCE | Admitting: Physical Therapy

## 2016-11-01 ENCOUNTER — Ambulatory Visit: Payer: PRIVATE HEALTH INSURANCE | Admitting: Physical Therapy

## 2016-11-05 ENCOUNTER — Ambulatory Visit: Payer: PRIVATE HEALTH INSURANCE | Attending: Internal Medicine | Admitting: Physical Therapy

## 2016-11-05 DIAGNOSIS — M25512 Pain in left shoulder: Secondary | ICD-10-CM | POA: Insufficient documentation

## 2016-11-05 DIAGNOSIS — M25612 Stiffness of left shoulder, not elsewhere classified: Secondary | ICD-10-CM | POA: Diagnosis present

## 2016-11-05 DIAGNOSIS — M6281 Muscle weakness (generalized): Secondary | ICD-10-CM | POA: Insufficient documentation

## 2016-11-05 NOTE — Therapy (Signed)
[place], [place], [address] Phone: [phone]   Fax:  [phone]  Physical Therapy Treatment  Patient Details  Name: Olivia [name] MRN: [id number] Date of Birth: [birth year] Referring Provider: Dr Dannial Monarch  Encounter Date: 11/05/2016      PT End of Session - 11/05/16 1520    Visit Number 2   Number of Visits 8   Date for PT Re-Evaluation 12/10/16   Authorization Type [facility]    Authorization - Visit Number 8   PT Start Time [id number]   PT Stop Time 1227   PT Time Calculation (min) 42 min   Activity Tolerance Patient tolerated treatment well   Behavior During Therapy [facility] for tasks assessed/performed      Past Medical History:  Diagnosis Date  . Allergy   . Asthma   . Contact lens/glasses fitting   . Dysrhythmia    PAC's-- related to meds- saw cardio 5 years ago  . Environmental allergies   . Headache(784.0)    hx migraines- hormonal  . Hiatal hernia   . Migraine aura without headache   . Pneumonia 3/13  . PONV (postoperative nausea and vomiting)   . Reflux     Past Surgical History:  Procedure Laterality Date  . HERNIA REPAIR  04/14/12   hiatal hernia  . HIATAL HERNIA REPAIR  04/14/2012   Procedure: LAPAROSCOPIC REPAIR OF HIATAL HERNIA;  Surgeon: Pedro Earls, MD;  Location: [facility];  Service: General;  Laterality: N/A;  Hiatal Hernia Repair-Replacement of Lap Band  . LAPAROSCOPIC GASTRIC BANDING  08/08/08  . TONSILLECTOMY  1981    There were no vitals filed for this visit.      Subjective Assessment - 11/05/16 1229    Subjective Patient reports a significant improvement since the last visit. She has improved pain free range. She reports she is able to drive and use her shoiulder for ADL's. She has been reaching behind her back as well. There are certain spots were she feels the pain.    Pertinent History Scoliosis    Limitations Lifting;House hold activities   Diagnostic tests  Nothing at this time    Patient Stated Goals To decrease pain with the use of the left arm    Currently in Pain? Yes   Pain Score 1    Pain Location Shoulder   Pain Orientation Left   Pain Descriptors / Indicators Aching   Pain Type Acute pain   Pain Onset More than a month ago   Pain Frequency Intermittent   Aggravating Factors  horizontal abduction    Pain Relieving Factors alleve, ben-gay    Effect of Pain on Daily Activities difficulty using the left side    Multiple Pain Sites No                         [facility] Adult PT Treatment/Exercise - 11/05/16 0001      Shoulder Exercises: Supine   Other Supine Exercises 2x10 in supine      Shoulder Exercises: Sidelying   Other Sidelying Exercises side lying ER 1lb weight 2x10      Modalities   Modalities Iontophoresis     Iontophoresis   Type of Iontophoresis Dexamethasone   Location left biceps tendon   Dose 1cc    Time slow release      Manual Therapy   Manual therapy comments Grade 1 and 2 PA and AP mobilizations to  decrease pain and improve movement in the capsule.                 PT Education - 11/05/16 1519    Education provided Yes   Education Details Patient given active shoulder movements   Person(s) Educated Patient   Methods Explanation   Comprehension Verbalized understanding;Returned demonstration          PT Short Term Goals - 11/05/16 1523      PT SHORT TERM GOAL #1   Title Patient will increase active flexion by 20 degrees on the left    Time 4   Period Weeks   Status On-going     PT SHORT TERM GOAL #2   Title Patient will increase gross right shoulder strength to 5/5    Time 4   Period Weeks   Status On-going     PT SHORT TERM GOAL #3   Title Patient will be independent with HEP    Time 4   Period Weeks   Status On-going     PT SHORT TERM GOAL #4   Title Patient will report 2/10 pain at worst    Time 4   Period Weeks   Status On-going           PT Long  Term Goals - 10/15/16 1633      PT LONG TERM GOAL #1   Title Patient wil demsotrate full functional external rotation without pain in order to do her hair   Time 8   Period Weeks   Status New     PT LONG TERM GOAL #2   Title Patient will rwach behind her back without pain    Time 8   Period Weeks   Status New     PT LONG TERM GOAL #3   Title Patient will reach into cabinet to grab a 2lb object    Time 8   Period Weeks   Status New     PT LONG TERM GOAL #4   Title Patients FOTO score will be less then 36% disability    Time 8   Period [place] - 11/05/16 1522    Clinical Impression Statement Patient had some pain with active ROM of the shoulder and functional stregthening. Therapy worked on stretching the postrior capsule in order to open up external and internal rotation.    Rehab Potential Good   PT Frequency 2x / week   PT Duration 8 weeks   PT Treatment/Interventions ADLs/Self Care Home Management;Iontophoresis 4mg /ml Dexamethasone;Ultrasound;Moist Heat;Cryotherapy;Electrical Stimulation;Gait training;Stair training;Neuromuscular re-education;Therapeutic exercise;Therapeutic activities;Patient/family education;Manual techniques;Dry needling;Energy conservation;Taping;Functional mobility training   PT Next Visit Plan continue iontpo, consider ultra sound, continue Grade I and II mobilizations, Add ER/ IR strengthening as tolerated, consdier stabilization stregthening, consider UE range for flexion,    PT Home Exercise Plan scapular retraction, shoulder extension   Consulted and Agree with Plan of Care Patient      Patient will benefit from skilled therapeutic intervention in order to improve the following deficits and impairments:  Decreased range of motion, Pain, Impaired UE functional use, Decreased activity tolerance, Decreased strength, Postural dysfunction  Visit Diagnosis: Acute pain of left shoulder  Muscle weakness  (generalized)  Stiffness of left shoulder, not elsewhere classified     Problem List Patient Active Problem List   Diagnosis Date Noted  . Muscle spasm 10/04/2016  . Tension headache 10/04/2016  .  PCOS (polycystic ovarian syndrome) 10/25/2014  . Migraine with aura 01/11/2014  . Muscle spasms of neck 01/11/2014  . Lapband APS resited with [facility] repair June 2013 05/08/2012  . HYPERLIPIDEMIA 04/15/2008  . OBESITY 04/15/2008  . Allergic rhinitis 04/15/2008  . Asthma 04/15/2008    Carney Living 11/05/2016, 3:33 PM  [facility] [address] [place], [place], [address] Phone: [phone]   Fax:  [phone]  Name: Olivia [name] MRN: [id number] Date of Birth: 25-Jul-1966

## 2016-11-07 ENCOUNTER — Ambulatory Visit: Payer: PRIVATE HEALTH INSURANCE | Admitting: Physical Therapy

## 2016-11-11 ENCOUNTER — Ambulatory Visit: Payer: PRIVATE HEALTH INSURANCE | Admitting: Physical Therapy

## 2016-11-11 DIAGNOSIS — M25512 Pain in left shoulder: Secondary | ICD-10-CM | POA: Diagnosis not present

## 2016-11-11 DIAGNOSIS — M6281 Muscle weakness (generalized): Secondary | ICD-10-CM

## 2016-11-11 DIAGNOSIS — M25612 Stiffness of left shoulder, not elsewhere classified: Secondary | ICD-10-CM

## 2016-11-11 NOTE — Therapy (Signed)
[place], [place], [address] Phone: [phone]   Fax:  [phone]  Physical Therapy Evaluation  Patient Details  Name: Olivia [name] MRN: [id number] Date of Birth: 1966/08/28 Referring Provider: Dr Dannial Monarch  Encounter Date: 11/11/2016      PT End of Session - 11/11/16 1626    Visit Number 3   Number of Visits 8   Date for PT Re-Evaluation 12/10/16   Authorization Type [facility]    Authorization - Visit Number 8   PT Start Time 0845   PT Stop Time 0928   PT Time Calculation (min) 43 min   Activity Tolerance Patient tolerated treatment well   Behavior During Therapy [facility] for tasks assessed/performed      Past Medical History:  Diagnosis Date  . Allergy   . Asthma   . Contact lens/glasses fitting   . Dysrhythmia    PAC's-- related to meds- saw cardio 5 years ago  . Environmental allergies   . Headache(784.0)    hx migraines- hormonal  . Hiatal hernia   . Migraine aura without headache   . Pneumonia 3/13  . PONV (postoperative nausea and vomiting)   . Reflux     Past Surgical History:  Procedure Laterality Date  . HERNIA REPAIR  04/14/12   hiatal hernia  . HIATAL HERNIA REPAIR  04/14/2012   Procedure: LAPAROSCOPIC REPAIR OF HIATAL HERNIA;  Surgeon: Pedro Earls, MD;  Location: [facility];  Service: General;  Laterality: N/A;  Hiatal Hernia Repair-Replacement of Lap Band  . LAPAROSCOPIC GASTRIC BANDING  08/08/08  . TONSILLECTOMY  1981    There were no vitals filed for this visit.       Subjective Assessment - 11/11/16 0852    Subjective Patient reports her shoulder has been sore since she started doing her exercises. She is having pain at night. She is also havinbg pain when she is driving.    Limitations Lifting;House hold activities   Diagnostic tests Nothing at this time    Patient Stated Goals To decrease pain with the use of the left arm    Currently in Pain? Yes   Pain Score 2     Pain Location Shoulder   Pain Orientation Left   Pain Descriptors / Indicators Aching   Pain Type Acute pain   Pain Onset More than a month ago   Pain Frequency Intermittent   Aggravating Factors  horizontal abduction    Pain Relieving Factors alleve, ben-gay    Effect of Pain on Daily Activities difficulty using the left side    Multiple Pain Sites No                       [facility] Adult PT Treatment/Exercise - 11/11/16 0001      Shoulder Exercises: Supine   Other Supine Exercises 2x10 in supine      Shoulder Exercises: Sidelying   Other Sidelying Exercises side lying ER 1lb weight 2x10      Shoulder Exercises: Standing   Other Standing Exercises shoulder extnesion 2x10 red; scapular retraction 2x10 red  cuing required for proper technique.      Modalities   Modalities Iontophoresis     Iontophoresis   Type of Iontophoresis Dexamethasone   Location left biceps tendon   Dose 1cc    Time slow release      Manual Therapy   Manual therapy comments Grade 1 and 2 PA and AP  mobilizations to decrease pain and improve movement in the capsule.                 PT Education - 11/11/16 0855    Education provided Yes   Education Details continue stregthening    Person(s) Educated Patient   Methods Explanation   Comprehension Verbalized understanding;Returned demonstration          PT Short Term Goals - 11/11/16 1630      PT SHORT TERM GOAL #1   Title Patient will increase active flexion by 20 degrees on the left    Time 4   Period Weeks   Status On-going     PT SHORT TERM GOAL #2   Title Patient will increase gross right shoulder strength to 5/5    Time 4   Period Weeks   Status On-going     PT SHORT TERM GOAL #3   Title Patient will be independent with HEP    Time 4   Period Weeks   Status On-going     PT SHORT TERM GOAL #4   Title Patient will report 2/10 pain at worst    Time 4   Period Weeks   Status On-going           PT  Long Term Goals - 10/15/16 1633      PT LONG TERM GOAL #1   Title Patient wil demsotrate full functional external rotation without pain in order to do her hair   Time 8   Period Weeks   Status New     PT LONG TERM GOAL #2   Title Patient will rwach behind her back without pain    Time 8   Period Weeks   Status New     PT LONG TERM GOAL #3   Title Patient will reach into cabinet to grab a 2lb object    Time 8   Period Weeks   Status New     PT LONG TERM GOAL #4   Title Patients FOTO score will be less then 36% disability    Time 8   Period Weeks   Status New               Plan - 11/11/16 1628    Clinical Impression Statement Patient felt like she had more range after treatment. Therapy stoped her active movements and had her perfrom AAROM instead. She has less pain with aarom. Therapy continues to use Iontophoresis to reduce inflammation.  Patient reported some pain with scapular stregthening exercises at home. Patient advsed to no go toofar back into impingement positions. Improved pain with proper technique.    Rehab Potential Good   PT Frequency 2x / week   PT Duration 8 weeks   PT Treatment/Interventions ADLs/Self Care Home Management;Iontophoresis 4mg /ml Dexamethasone;Ultrasound;Moist Heat;Cryotherapy;Electrical Stimulation;Gait training;Stair training;Neuromuscular re-education;Therapeutic exercise;Therapeutic activities;Patient/family education;Manual techniques;Dry needling;Energy conservation;Taping;Functional mobility training   PT Next Visit Plan continue iontpo, consider ultra sound, continue Grade I and II mobilizations, Add ER/ IR strengthening as tolerated, consdier stabilization stregthening, consider UE range for flexion,    PT Home Exercise Plan scapular retraction, shoulder extension   Consulted and Agree with Plan of Care Patient      Patient will benefit from skilled therapeutic intervention in order to improve the following deficits and impairments:   Decreased range of motion, Pain, Impaired UE functional use, Decreased activity tolerance, Decreased strength, Postural dysfunction  Visit Diagnosis: Acute pain of left shoulder  Muscle weakness (generalized)  Stiffness  of left shoulder, not elsewhere classified     Problem List Patient Active Problem List   Diagnosis Date Noted  . Muscle spasm 10/04/2016  . Tension headache 10/04/2016  . PCOS (polycystic ovarian syndrome) 10/25/2014  . Migraine with aura 01/11/2014  . Muscle spasms of neck 01/11/2014  . Lapband APS resited with [facility] repair June 2013 05/08/2012  . HYPERLIPIDEMIA 04/15/2008  . OBESITY 04/15/2008  . Allergic rhinitis 04/15/2008  . Asthma 04/15/2008    Carney Living PT DPT  11/11/2016, 4:34 PM  [facility] [address] [place], [place], [address] Phone: [phone]   Fax:  [phone]  Name: DESHUNDA DUDECK MRN: [id number] Date of Birth: 07-10-1966

## 2016-11-14 ENCOUNTER — Ambulatory Visit: Payer: PRIVATE HEALTH INSURANCE | Admitting: Physical Therapy

## 2016-11-14 DIAGNOSIS — M25612 Stiffness of left shoulder, not elsewhere classified: Secondary | ICD-10-CM

## 2016-11-14 DIAGNOSIS — M25512 Pain in left shoulder: Secondary | ICD-10-CM | POA: Diagnosis not present

## 2016-11-14 DIAGNOSIS — M6281 Muscle weakness (generalized): Secondary | ICD-10-CM

## 2016-11-14 NOTE — Therapy (Signed)
[place], [place], [address] Phone: [phone]   Fax:  [phone]  Physical Therapy Treatment  Patient Details  Name: Olivia [name] MRN: [id number] Date of Birth: [birth year] Referring Provider: Dr Dannial Monarch  Encounter Date: 11/14/2016      PT End of Session - 11/14/16 0943    Visit Number 4   Number of Visits 8   Date for PT Re-Evaluation 12/10/16   Authorization Type [facility]    Authorization - Visit Number 8   PT Start Time 0840   PT Stop Time 0927   PT Time Calculation (min) 47 min   Activity Tolerance Patient tolerated treatment well   Behavior During Therapy [facility] for tasks assessed/performed      Past Medical History:  Diagnosis Date  . Allergy   . Asthma   . Contact lens/glasses fitting   . Dysrhythmia    PAC's-- related to meds- saw cardio 5 years ago  . Environmental allergies   . Headache(784.0)    hx migraines- hormonal  . Hiatal hernia   . Migraine aura without headache   . Pneumonia 3/13  . PONV (postoperative nausea and vomiting)   . Reflux     Past Surgical History:  Procedure Laterality Date  . HERNIA REPAIR  04/14/12   hiatal hernia  . HIATAL HERNIA REPAIR  04/14/2012   Procedure: LAPAROSCOPIC REPAIR OF HIATAL HERNIA;  Surgeon: Pedro Earls, MD;  Location: [facility];  Service: General;  Laterality: N/A;  Hiatal Hernia Repair-Replacement of Lap Band  . LAPAROSCOPIC GASTRIC BANDING  08/08/08  . TONSILLECTOMY  1981    There were no vitals filed for this visit.      Subjective Assessment - 11/14/16 0845    Subjective Patient reports her shoulder has been feeling better. She has laid off overhead movements. She does not have any pain today. She does report that she has had some "popping since last night. Last night she was able to sleep on her shoulder wihtout increased pain .    Pertinent History Scoliosis    Limitations Lifting;House hold activities   Diagnostic tests  Nothing at this time    Patient Stated Goals To decrease pain with the use of the left arm    Currently in Pain? No/denies                         [facility] Adult PT Treatment/Exercise - 11/14/16 0001      Shoulder Exercises: Supine   Other Supine Exercises 2x10 in supine      Shoulder Exercises: Sidelying   Other Sidelying Exercises side lying ER 2lb weight 2x10      Shoulder Exercises: Standing   Other Standing Exercises shoulder extnesion 2x10 green; scapular retraction 2x10 green  cuing required for proper technique. Red t-band IR 2x10 Red t-band IR 2x10      Modalities   Modalities Iontophoresis     Iontophoresis   Type of Iontophoresis Dexamethasone   Location left biceps tendon   Dose 1cc    Time slow release      Manual Therapy   Manual therapy comments Grade 2 and 3 PA and AP mobilizations to decrease pain and improve movement in the capsule.                 PT Education - 11/14/16 [id number]    Education provided Yes   Education Details Updated HEP. Educated patient  on potential causes of crepitus in the shoulder    Person(s) Educated Patient   Methods Explanation   Comprehension Verbalized understanding;Returned demonstration          PT Short Term Goals - 11/11/16 1630      PT SHORT TERM GOAL #1   Title Patient will increase active flexion by 20 degrees on the left    Time 4   Period Weeks   Status On-going     PT SHORT TERM GOAL #2   Title Patient will increase gross right shoulder strength to 5/5    Time 4   Period Weeks   Status On-going     PT SHORT TERM GOAL #3   Title Patient will be independent with HEP    Time 4   Period Weeks   Status On-going     PT SHORT TERM GOAL #4   Title Patient will report 2/10 pain at worst    Time 4   Period Weeks   Status On-going           PT Long Term Goals - 10/15/16 1633      PT LONG TERM GOAL #1   Title Patient wil demsotrate full functional external rotation without pain in  order to do her hair   Time 8   Period Weeks   Status New     PT LONG TERM GOAL #2   Title Patient will rwach behind her back without pain    Time 8   Period Weeks   Status New     PT LONG TERM GOAL #3   Title Patient will reach into cabinet to grab a 2lb object    Time 8   Period Weeks   Status New     PT LONG TERM GOAL #4   Title Patients FOTO score will be less then 36% disability    Time 8   Period Weeks   Status New               Plan - 11/14/16 1008    Clinical Impression Statement Patient reports she is aware of the shoulder but her pain has improved significantly. She is having some popping today when retunring from elevation. This is a new symptom but she is not having pain. When perfroming closed chain flexion she did not have the clicking and popping. Therapy will progress closed chain stabilization exercises as tolerate.d    PT Frequency 2x / week   PT Duration 8 weeks   PT Treatment/Interventions ADLs/Self Care Home Management;Iontophoresis 4mg /ml Dexamethasone;Ultrasound;Moist Heat;Cryotherapy;Electrical Stimulation;Gait training;Stair training;Neuromuscular re-education;Therapeutic exercise;Therapeutic activities;Patient/family education;Manual techniques;Dry needling;Energy conservation;Taping;Functional mobility training   PT Next Visit Plan continue ionto, consider ultra sound, continue Grade I and II mobilizations, Add ER/ IR strengthening as tolerated, consdier stabilization stregthening, consider UE range for flexion,    PT Home Exercise Plan scapular retraction, shoulder extension   Consulted and Agree with Plan of Care Patient      Patient will benefit from skilled therapeutic intervention in order to improve the following deficits and impairments:  Decreased range of motion, Pain, Impaired UE functional use, Decreased activity tolerance, Decreased strength, Postural dysfunction  Visit Diagnosis: Acute pain of left shoulder  Muscle weakness  (generalized)  Stiffness of left shoulder, not elsewhere classified     Problem List Patient Active Problem List   Diagnosis Date Noted  . Muscle spasm 10/04/2016  . Tension headache 10/04/2016  . PCOS (polycystic ovarian syndrome) 10/25/2014  . Migraine with aura  01/11/2014  . Muscle spasms of neck 01/11/2014  . Lapband APS resited with [facility] repair June 2013 05/08/2012  . HYPERLIPIDEMIA 04/15/2008  . OBESITY 04/15/2008  . Allergic rhinitis 04/15/2008  . Asthma 04/15/2008    Carney Living PT DPT  11/14/2016, 12:01 PM  [facility] [address] [place], [place], [address] Phone: [phone]   Fax:  [phone]  Name: Olivia [name] MRN: [id number] Date of Birth: [birth year]

## 2016-11-20 ENCOUNTER — Ambulatory Visit: Payer: PRIVATE HEALTH INSURANCE | Admitting: Physical Therapy

## 2016-11-22 ENCOUNTER — Ambulatory Visit: Payer: PRIVATE HEALTH INSURANCE | Admitting: Physical Therapy

## 2016-11-25 ENCOUNTER — Other Ambulatory Visit: Payer: Self-pay | Admitting: Internal Medicine

## 2016-11-25 ENCOUNTER — Ambulatory Visit: Payer: PRIVATE HEALTH INSURANCE | Attending: Internal Medicine | Admitting: Physical Therapy

## 2016-11-25 DIAGNOSIS — M25612 Stiffness of left shoulder, not elsewhere classified: Secondary | ICD-10-CM | POA: Insufficient documentation

## 2016-11-25 DIAGNOSIS — M25512 Pain in left shoulder: Secondary | ICD-10-CM | POA: Insufficient documentation

## 2016-11-25 DIAGNOSIS — M6281 Muscle weakness (generalized): Secondary | ICD-10-CM | POA: Diagnosis present

## 2016-11-25 MED FILL — tiZANidine HCL 4 MG TABS: 4 | 7 days supply | Qty: 30 | Fill #1

## 2016-11-25 NOTE — Therapy (Signed)
[place], [place], [address] Phone: [phone]   Fax:  [phone]  Physical Therapy Treatment  Patient Details  Name: Olivia [name] MRN: [id number] Date of Birth: [birth year] Referring Provider: Dr Dannial Monarch  Encounter Date: 11/25/2016      PT End of Session - 11/25/16 0906    Visit Number 5   Number of Visits 8   Date for PT Re-Evaluation 12/10/16   Authorization Type [facility]    Authorization - Visit Number 8   PT Start Time 0845   PT Stop Time 0928   PT Time Calculation (min) 43 min   Activity Tolerance Patient tolerated treatment well   Behavior During Therapy [facility] for tasks assessed/performed      Past Medical History:  Diagnosis Date  . Allergy   . Asthma   . Contact lens/glasses fitting   . Dysrhythmia    PAC's-- related to meds- saw cardio 5 years ago  . Environmental allergies   . Headache(784.0)    hx migraines- hormonal  . Hiatal hernia   . Migraine aura without headache   . Pneumonia 3/13  . PONV (postoperative nausea and vomiting)   . Reflux     Past Surgical History:  Procedure Laterality Date  . HERNIA REPAIR  04/14/12   hiatal hernia  . HIATAL HERNIA REPAIR  04/14/2012   Procedure: LAPAROSCOPIC REPAIR OF HIATAL HERNIA;  Surgeon: Pedro Earls, MD;  Location: [facility];  Service: General;  Laterality: N/A;  Hiatal Hernia Repair-Replacement of Lap Band  . LAPAROSCOPIC GASTRIC BANDING  08/08/08  . TONSILLECTOMY  1981    There were no vitals filed for this visit.      Subjective Assessment - 11/25/16 0903    Subjective Patient drove to [facility] and had some pain. She has no pain this morning. She feels some pain with overhead reaching.    Pertinent History Scoliosis    Limitations Lifting;House hold activities   Diagnostic tests Nothing at this time    Patient Stated Goals To decrease pain with the use of the left arm    Currently in Pain? No/denies                          [facility] Adult PT Treatment/Exercise - 11/25/16 0001      Shoulder Exercises: Supine   Other Supine Exercises Flexion 2x10 1lb ; supine ABC 2x 2lb      Shoulder Exercises: Sidelying   Other Sidelying Exercises side lying ER 2lb weight 2x10      Shoulder Exercises: Standing   Other Standing Exercises shoulder extnesion 2x10 green; scapular retraction 2x10 green  cuing required for proper technique. Red t-band IR 2x10 Red t-band IR 2x10    Other Standing Exercises wall disting clock wise counterclockwise, up and down side to side 3x10 each; wall flexion wiht scapular retraction at the top.      Modalities   Modalities --     Manual Therapy   Manual therapy comments Grade 2 and 3 PA and AP mobilizations to decrease pain and improve movement in the capsule.                 PT Education - 11/25/16 0904    Education provided Yes   Education Details continue with HEP; rationale of strengthening   Person(s) Educated Patient   Methods Explanation   Comprehension Verbalized understanding;Returned demonstration  PT Short Term Goals - 11/11/16 1630      PT SHORT TERM GOAL #1   Title Patient will increase active flexion by 20 degrees on the left    Time 4   Period Weeks   Status On-going     PT SHORT TERM GOAL #2   Title Patient will increase gross right shoulder strength to 5/5    Time 4   Period Weeks   Status On-going     PT SHORT TERM GOAL #3   Title Patient will be independent with HEP    Time 4   Period Weeks   Status On-going     PT SHORT TERM GOAL #4   Title Patient will report 2/10 pain at worst    Time 4   Period Weeks   Status On-going           PT Long Term Goals - 10/15/16 1633      PT LONG TERM GOAL #1   Title Patient wil demsotrate full functional external rotation without pain in order to do her hair   Time 8   Period Weeks   Status New     PT LONG TERM GOAL #2   Title Patient will rwach  behind her back without pain    Time 8   Period Weeks   Status New     PT LONG TERM GOAL #3   Title Patient will reach into cabinet to grab a 2lb object    Time 8   Period Weeks   Status New     PT LONG TERM GOAL #4   Title Patients FOTO score will be less then 36% disability    Time 8   Period Weeks   Status New               Plan - 11/25/16 0906    Clinical Impression Statement Patient is making good progress. She does continue to have popping in her shoulder with forwards flexion. She was fatigued during the second set of side lying ER but no real pain. Therapy will continue to work on increasing strength and stability in her shoulder. Therapy held on Iontophoresis today. Patient encouraged to ice if the patient is sore later.    Rehab Potential Good   PT Frequency 2x / week   PT Duration 8 weeks   PT Treatment/Interventions ADLs/Self Care Home Management;Iontophoresis 4mg /ml Dexamethasone;Ultrasound;Moist Heat;Cryotherapy;Electrical Stimulation;Gait training;Stair training;Neuromuscular re-education;Therapeutic exercise;Therapeutic activities;Patient/family education;Manual techniques;Dry needling;Energy conservation;Taping;Functional mobility training   PT Next Visit Plan continue ionto, consider ultra sound, continue Grade I and II mobilizations, Add ER/ IR strengthening as tolerated, consdier stabilization stregthening, consider UE range for flexion,    PT Home Exercise Plan scapular retraction, shoulder extension   Consulted and Agree with Plan of Care Patient      Patient will benefit from skilled therapeutic intervention in order to improve the following deficits and impairments:  Decreased range of motion, Pain, Impaired UE functional use, Decreased activity tolerance, Decreased strength, Postural dysfunction  Visit Diagnosis: Acute pain of left shoulder  Muscle weakness (generalized)  Stiffness of left shoulder, not elsewhere classified     Problem  List Patient Active Problem List   Diagnosis Date Noted  . Muscle spasm 10/04/2016  . Tension headache 10/04/2016  . PCOS (polycystic ovarian syndrome) 10/25/2014  . Migraine with aura 01/11/2014  . Muscle spasms of neck 01/11/2014  . Lapband APS resited with [facility] repair June 2013 05/08/2012  . HYPERLIPIDEMIA 04/15/2008  .  OBESITY 04/15/2008  . Allergic rhinitis 04/15/2008  . Asthma 04/15/2008    Carney Living  PT DPT  11/25/2016, 9:31 AM  [facility] [address] [place], [place], [address] Phone: [phone]   Fax:  [phone]  Name: Olivia [name] MRN: [id number] Date of Birth: 07-20-1966

## 2016-11-28 ENCOUNTER — Ambulatory Visit: Payer: PRIVATE HEALTH INSURANCE | Admitting: Physical Therapy

## 2016-11-28 DIAGNOSIS — M6281 Muscle weakness (generalized): Secondary | ICD-10-CM | POA: Diagnosis not present

## 2016-11-28 DIAGNOSIS — M25512 Pain in left shoulder: Secondary | ICD-10-CM | POA: Diagnosis not present

## 2016-11-28 DIAGNOSIS — M25612 Stiffness of left shoulder, not elsewhere classified: Secondary | ICD-10-CM | POA: Diagnosis not present

## 2016-11-28 NOTE — Therapy (Signed)
[place], [place], [address] Phone: [phone]   Fax:  [phone]  Physical Therapy Treatment  Patient Details  Name: Olivia [name] MRN: [id number] Date of Birth: 05/01/1966 Referring Provider: Dr Dannial Monarch  Encounter Date: 11/28/2016      PT End of Session - 11/28/16 0856    Visit Number 6   Number of Visits 8   Date for PT Re-Evaluation 12/10/16   Authorization Type [facility]    Authorization - Visit Number 8   PT Start Time 0845   PT Stop Time [id number]   PT Time Calculation (min) 49 min   Activity Tolerance Patient tolerated treatment well   Behavior During Therapy [facility] for tasks assessed/performed      Past Medical History:  Diagnosis Date  . Allergy   . Asthma   . Contact lens/glasses fitting   . Dysrhythmia    PAC's-- related to meds- saw cardio 5 years ago  . Environmental allergies   . Headache(784.0)    hx migraines- hormonal  . Hiatal hernia   . Migraine aura without headache   . Pneumonia 3/13  . PONV (postoperative nausea and vomiting)   . Reflux     Past Surgical History:  Procedure Laterality Date  . HERNIA REPAIR  04/14/12   hiatal hernia  . HIATAL HERNIA REPAIR  04/14/2012   Procedure: LAPAROSCOPIC REPAIR OF HIATAL HERNIA;  Surgeon: Pedro Earls, MD;  Location: [facility];  Service: General;  Laterality: N/A;  Hiatal Hernia Repair-Replacement of Lap Band  . LAPAROSCOPIC GASTRIC BANDING  08/08/08  . TONSILLECTOMY  1981    There were no vitals filed for this visit.      Subjective Assessment - 11/28/16 0850    Subjective Patient reports her neck is sore today. Her shoulder has felt good. She has had no pain with the new exercises with her shoulder.    Pertinent History Scoliosis    Limitations Lifting;House hold activities   Diagnostic tests Nothing at this time    Patient Stated Goals To decrease pain with the use of the left arm    Currently in Pain? No/denies                          [facility] Adult PT Treatment/Exercise - 11/28/16 0001      Exercises   Exercises Shoulder     Shoulder Exercises: Supine   Other Supine Exercises Flexion 2x10 1lb ; supine ABC 2x 2lb      Shoulder Exercises: Prone   Retraction Limitations 2x10 2lb    Extension Limitations 2x10 2lb      Shoulder Exercises: Sidelying   Other Sidelying Exercises side lying ER 2lb weight 2x10      Shoulder Exercises: Standing   Other Standing Exercises shoulder extension 2x10 green; scapular retraction 2x10 green  cuing required for proper technique. Red t-band IR 2x10 Red t-band IR 2x10    Other Standing Exercises wall dusting clock wise counterclockwise, up and down side to side 3x10 each; wall flexion wiht scapular retraction at the top.      Shoulder Exercises: Power [employer] trader lat pull down 2x10; row both handles 2x10      Modalities   Modalities Cryotherapy     Cryotherapy   Number Minutes Cryotherapy 10 Minutes   Cryotherapy Location Shoulder   Type of Cryotherapy Ice pack     Manual  Therapy   Manual therapy comments Grade 2 and 3 PA and AP mobilizations to decrease pain and improve movement in the capsule.                 PT Education - 11/28/16 [phone]    Education provided Yes   Education Details continue with current HEP;    Person(s) Educated Patient   Methods Explanation   Comprehension Verbalized understanding;Returned demonstration          PT Short Term Goals - 11/28/16 1333      PT SHORT TERM GOAL #1   Title Patient will increase active flexion by 20 degrees on the left    Time 4   Period Weeks   Status On-going     PT SHORT TERM GOAL #2   Title Patient will increase gross right shoulder strength to 5/5    Time 4   Period Weeks   Status On-going     PT SHORT TERM GOAL #3   Title Patient will be independent with HEP    Time 4   Period Weeks   Status On-going     PT SHORT TERM GOAL #4    Title Patient will report 2/10 pain at worst    Time 4   Period Weeks   Status On-going           PT Long Term Goals - 10/15/16 1633      PT LONG TERM GOAL #1   Title Patient wil demsotrate full functional external rotation without pain in order to do her hair   Time 8   Period Weeks   Status New     PT LONG TERM GOAL #2   Title Patient will rwach behind her back without pain    Time 8   Period Weeks   Status New     PT LONG TERM GOAL #3   Title Patient will reach into cabinet to grab a 2lb object    Time 8   Period Weeks   Status New     PT LONG TERM GOAL #4   Title Patients FOTO score will be less then 36% disability    Time 8   Period Weeks   Status New               Plan - 11/28/16 1330    Clinical Impression Statement Patient continues to make good progress. She was able to complete gym activity eith no increase in pain. She will be seen for her last 2 visits and likely discharge to her home program. She had full pain free range of motion today.     Rehab Potential Good   PT Frequency 2x / week   PT Duration 8 weeks   PT Treatment/Interventions ADLs/Self Care Home Management;Iontophoresis 4mg /ml Dexamethasone;Ultrasound;Moist Heat;Cryotherapy;Electrical Stimulation;Gait training;Stair training;Neuromuscular re-education;Therapeutic exercise;Therapeutic activities;Patient/family education;Manual techniques;Dry needling;Energy conservation;Taping;Functional mobility training   PT Next Visit Plan continue ionto, consider ultra sound, continue Grade I and II mobilizations, Add ER/ IR strengthening as tolerated, consdier stabilization stregthening, consider UE range for flexion,    PT Home Exercise Plan scapular retraction, shoulder extension   Consulted and Agree with Plan of Care Patient      Patient will benefit from skilled therapeutic intervention in order to improve the following deficits and impairments:  Decreased range of motion, Pain, Impaired UE  functional use, Decreased activity tolerance, Decreased strength, Postural dysfunction  Visit Diagnosis: Acute pain of left shoulder  Muscle weakness (generalized)  Stiffness of  left shoulder, not elsewhere classified     Problem List Patient Active Problem List   Diagnosis Date Noted  . Muscle spasm 10/04/2016  . Tension headache 10/04/2016  . PCOS (polycystic ovarian syndrome) 10/25/2014  . Migraine with aura 01/11/2014  . Muscle spasms of neck 01/11/2014  . Lapband APS resited with [facility] repair June 2013 05/08/2012  . HYPERLIPIDEMIA 04/15/2008  . OBESITY 04/15/2008  . Allergic rhinitis 04/15/2008  . Asthma 04/15/2008    Carney Living PT DPT  11/28/2016, 1:43 PM  [facility] [address] [place], [place], [address] Phone: [phone]   Fax:  [phone]  Name: Olivia [name] MRN: [id number] Date of Birth: [birth year]

## 2016-12-05 ENCOUNTER — Ambulatory Visit: Payer: PRIVATE HEALTH INSURANCE | Attending: Internal Medicine | Admitting: Physical Therapy

## 2016-12-05 DIAGNOSIS — M25612 Stiffness of left shoulder, not elsewhere classified: Secondary | ICD-10-CM | POA: Diagnosis not present

## 2016-12-05 DIAGNOSIS — M6281 Muscle weakness (generalized): Secondary | ICD-10-CM | POA: Diagnosis not present

## 2016-12-05 DIAGNOSIS — M25512 Pain in left shoulder: Secondary | ICD-10-CM | POA: Insufficient documentation

## 2016-12-05 NOTE — Therapy (Addendum)
[place], [place], [address] Phone: [phone]   Fax:  [phone]  Physical Therapy Treatment  Patient Details  Name: MARKEITA ALICIA MRN: [id number] Date of Birth: [birth year] Referring Provider: Dr Dannial Monarch  Encounter Date: 12/05/2016      PT End of Session - 12/05/16 0853    Visit Number 7   Number of Visits 8   Date for PT Re-Evaluation 12/10/16   Authorization Type [facility]    Authorization - Visit Number 8   PT Start Time 0845   PT Stop Time 0935   PT Time Calculation (min) 50 min   Activity Tolerance Patient tolerated treatment well   Behavior During Therapy [facility] for tasks assessed/performed      Past Medical History:  Diagnosis Date  . Allergy   . Asthma   . Contact lens/glasses fitting   . Dysrhythmia    PAC's-- related to meds- saw cardio 5 years ago  . Environmental allergies   . Headache(784.0)    hx migraines- hormonal  . Hiatal hernia   . Migraine aura without headache   . Pneumonia 3/13  . PONV (postoperative nausea and vomiting)   . Reflux     Past Surgical History:  Procedure Laterality Date  . HERNIA REPAIR  04/14/12   hiatal hernia  . HIATAL HERNIA REPAIR  04/14/2012   Procedure: LAPAROSCOPIC REPAIR OF HIATAL HERNIA;  Surgeon: Pedro Earls, MD;  Location: [facility];  Service: General;  Laterality: N/A;  Hiatal Hernia Repair-Replacement of Lap Band  . LAPAROSCOPIC GASTRIC BANDING  08/08/08  . TONSILLECTOMY  1981    There were no vitals filed for this visit.      Subjective Assessment - 12/05/16 0850    Subjective Patient reports no significant increase in pain after last visit. She reports a tiwnge of pain every once and a while but overall her shoulder is feeling much better. Her neck is feeling better as well.    Limitations Lifting;House hold activities   Diagnostic tests Nothing at this time    Currently in Pain? No/denies                         [facility] Adult PT Treatment/Exercise - 12/05/16 0001      Shoulder Exercises: Supine   Other Supine Exercises Flexion 2x10 1lb ; supine ABC 2x 2lb    Other Supine Exercises Supine D2 flexion      Shoulder Exercises: Sidelying   Other Sidelying Exercises side lying ER 2lb weight 2x10      Shoulder Exercises: Standing   Other Standing Exercises shoulder extension 2x10 green; scapular retraction 2x10 green  cuing required for proper technique. Red t-band IR 2x10 Red t-band IR 2x10; Standing shoulder flexion 2lb 1x10 Standing shoulder scaption 2x10     Other Standing Exercises wall dusting clock wise counterclockwise, up and down side to side 3x10 each; wall flexion wiht scapular retraction at the top.      Shoulder Exercises: Power Futures trader lat pull down 2x10; row both handles 2x10      Modalities   Modalities Cryotherapy     Cryotherapy   Number Minutes Cryotherapy 10 Minutes   Cryotherapy Location Shoulder   Type of Cryotherapy Ice pack                PT Education - 12/05/16 [phone]    Education provided Yes  Education Details continue with HEP    Person(s) Educated Patient   Methods Explanation   Comprehension Verbalized understanding;Returned demonstration          PT Short Term Goals - 12/05/16 0948      PT SHORT TERM GOAL #1   Title Patient will increase active flexion by 20 degrees on the left    Baseline full active flexion    Time 4   Period Weeks   Status Achieved     PT SHORT TERM GOAL #2   Title Patient will increase gross right shoulder strength to 5/5    Baseline 5/5 gross left shoulder strength    Time 4   Period Weeks   Status Achieved     PT SHORT TERM GOAL #3   Title Patient will be independent with HEP    Time 4   Period Weeks   Status Achieved     PT SHORT TERM GOAL #4   Title Patient will report 2/10 pain at worst    Time 4   Period Weeks   Status Achieved           PT Long Term Goals - 12/05/16  5035      PT LONG TERM GOAL #1   Title Patient will demsotrate full functional external rotation without pain in order to do her hair   Baseline Patient doing her hair    Time 8   Period Weeks   Status Achieved     PT LONG TERM GOAL #2   Title Patient will reach behind her back without pain    Baseline performing behind    Time 8   Period Weeks   Status Achieved     PT LONG TERM GOAL #3   Title Patient will reach into cabinet to grab a 2lb object    Baseline was able to reach overhead 10x with a 2lb weight    Time 8   Period Weeks   Status Achieved     PT LONG TERM GOAL #4   Title Patients FOTO score will be less then 36% disability    Baseline 40% but reports significant imporvement in function    Time 8   Period Weeks   Status Not Met               Plan - 12/05/16 0920    Clinical Impression Statement Patient has made good progress. She has occasional twinges of pain. She is using her shoulder for ADL's. She is able to lift lighter objects into a cabinet without much difficulty. Therapy added supine D2 flexion and standing flexion and scaption. Therapy reviewed gym program with the patient. She had no increase in pain with her gym program.    Rehab Potential Good   PT Frequency 2x / week   PT Duration 8 weeks   PT Treatment/Interventions ADLs/Self Care Home Management;Iontophoresis 30m/ml Dexamethasone;Ultrasound;Moist Heat;Cryotherapy;Electrical Stimulation;Gait training;Stair training;Neuromuscular re-education;Therapeutic exercise;Therapeutic activities;Patient/family education;Manual techniques;Dry needling;Energy conservation;Taping;Functional mobility training   PT Next Visit Plan If patiewnt returns assess shoulder. Continue with strengthening. Continue with manual therapy.    PT Home Exercise Plan scapular retraction, shoulder extension   Consulted and Agree with Plan of Care Patient      Patient will benefit from skilled therapeutic intervention in order  to improve the following deficits and impairments:  Decreased range of motion, Pain, Impaired UE functional use, Decreased activity tolerance, Decreased strength, Postural dysfunction  Visit Diagnosis: Acute pain of left shoulder  Muscle weakness (  generalized)  Stiffness of left shoulder, not elsewhere classified  PHYSICAL THERAPY DISCHARGE SUMMARY  Visits from Start of Care:7  Current functional level related to goals / functional outcomes: Patient had 1 more visit approved but was only going to use it if needed. She has not returned   Remaining deficits: Minor pain from time to time    Education / Equipment: HEP Plan: Patient agrees to discharge.  Patient goals were not met. Patient is being discharged due to meeting the stated rehab goals.  ?????       Problem List Patient Active Problem List   Diagnosis Date Noted  . Muscle spasm 10/04/2016  . Tension headache 10/04/2016  . PCOS (polycystic ovarian syndrome) 10/25/2014  . Migraine with aura 01/11/2014  . Muscle spasms of neck 01/11/2014  . Lapband APS resited with [facility] repair June 2013 05/08/2012  . HYPERLIPIDEMIA 04/15/2008  . OBESITY 04/15/2008  . Allergic rhinitis 04/15/2008  . Asthma 04/15/2008    Carney Living PT DPT  12/05/2016, 9:58 AM  [facility] [address] [place], [place], [address] Phone: [phone]   Fax:  [phone]  Name: NAIYA CORRAL MRN: [id number] Date of Birth: 04/18/1966

## 2017-01-01 DIAGNOSIS — E282 Polycystic ovarian syndrome: Secondary | ICD-10-CM | POA: Diagnosis not present

## 2017-01-01 DIAGNOSIS — D509 Iron deficiency anemia, unspecified: Secondary | ICD-10-CM | POA: Diagnosis not present

## 2017-01-01 DIAGNOSIS — R7301 Impaired fasting glucose: Secondary | ICD-10-CM | POA: Diagnosis not present

## 2017-01-01 MED FILL — PHENTERMINE 15 MG CAPSULE: 15 | 30 days supply | Qty: 30 | Fill #0

## 2017-01-01 MED FILL — METFORMIN HCL ER 500 MG TAB: 500 | 30 days supply | Qty: 30 | Fill #0

## 2017-01-28 ENCOUNTER — Encounter: Payer: Self-pay | Admitting: Physician Assistant

## 2017-01-30 MED FILL — MONTELUKAST SOD 10 MG TAB: 10 | 90 days supply | Qty: 90 | Fill #0

## 2017-02-26 MED FILL — METFORMIN HCL ER 500 MG TAB: 500 | 30 days supply | Qty: 30 | Fill #1

## 2017-03-28 ENCOUNTER — Ambulatory Visit (INDEPENDENT_AMBULATORY_CARE_PROVIDER_SITE_OTHER): Payer: 59 | Admitting: Physician Assistant

## 2017-03-28 ENCOUNTER — Encounter: Payer: Self-pay | Admitting: Physician Assistant

## 2017-03-28 VITALS — BP 112/76 | HR 83 | Ht 69.5 in | Wt 313.0 lb

## 2017-03-28 DIAGNOSIS — M62838 Other muscle spasm: Secondary | ICD-10-CM

## 2017-03-28 DIAGNOSIS — G44209 Tension-type headache, unspecified, not intractable: Secondary | ICD-10-CM | POA: Diagnosis not present

## 2017-03-28 DIAGNOSIS — G43109 Migraine with aura, not intractable, without status migrainosus: Secondary | ICD-10-CM | POA: Diagnosis not present

## 2017-03-28 MED ORDER — VENLAFAXINE HCL ER 37.5 MG PO CP24: 37.5000 mg | ORAL_CAPSULE | Freq: Every day | ORAL | 2 refills | Status: DC

## 2017-03-28 MED ORDER — TIZANIDINE HCL 4 MG PO TABS: 4.0000 mg | ORAL_TABLET | Freq: Four times a day (QID) | ORAL | 1 refills | Status: DC | PRN

## 2017-03-28 NOTE — Progress Notes (Signed)
History:  Olivia [name] is a 51 y.o. No obstetric history on file. who presents to clinic today for headache followup.  She got better after getting a new car and realized she was actually having carbon monoxide.  Last time, she had TPI which helped immediately but after a few hours, the HA returned.  Then the steroids kicked in and she had good relief.   zanaflex is helpful for sleep and headache.   Patient requesting tpi today.    HIT6:58 Number of days in the last 4 weeks with:  Severe headache: 6 Moderate headache: 5 Mild headache: 10  No headache: 7   Past Medical History:  Diagnosis Date  . Allergy   . Anemia   . Asthma   . Contact lens/glasses fitting   . Dysrhythmia    PAC's-- related to meds- saw cardio 5 years ago  . Environmental allergies   . Headache(784.0)    hx migraines- hormonal  . Hiatal hernia   . Migraine aura without headache   . Pneumonia 3/13  . PONV (postoperative nausea and vomiting)   . Reflux     Social History   Social History  . Marital status: Married    Spouse name: N/A  . Number of children: N/A  . Years of education: N/A   Occupational History  . Not on file.   Social History Main Topics  . Smoking status: Never Smoker  . Smokeless tobacco: Never Used  . Alcohol use No  . Drug use: No  . Sexual activity: Yes    Birth control/ protection: IUD     Comment: Vasectomy in spouse   Other Topics Concern  . Not on file   Social History Narrative  . No narrative on file    Family History  Problem Relation Age of Onset  . Diabetes Mother   . Obesity Mother   . Colon polyps Mother   . Cancer Mother        colon  . Obesity Father   . Hypertension Father   . Obesity Sister     Allergies  Allergen Reactions  . Cephalosporins Anaphylaxis  . Eggs Or Egg-Derived Products Hives and Nausea And Vomiting    Reaction=angioedema   . Penicillins Anaphylaxis  . Sulfa Drugs Cross Reactors     Liver failure    Current  Outpatient Prescriptions on File Prior to Visit  Medication Sig Dispense Refill  . albuterol (PROVENTIL HFA;VENTOLIN HFA) 108 (90 BASE) MCG/ACT inhaler Inhale 2 puffs into the lungs every 6 (six) hours as needed. For shortness of breath 1 Inhaler 1  . Albuterol Sulfate (PROAIR RESPICLICK) 379 (90 BASE) MCG/ACT AEPB Inhale 2 Inhalers into the lungs 4 (four) times daily as needed. 1 each 3  . betamethasone dipropionate (DIPROLENE) 0.05 % cream Apply topically 2 (two) times daily. APPLY TO AREAS OF SKIN AS NEEDED    . cetirizine (ZYRTEC) 10 MG tablet Take 10 mg by mouth at bedtime.     . Cholecalciferol (VITAMIN D) 2000 units CAPS Take 1 capsule by mouth daily.    . Clobetasol Propionate 0.05 % lotion APPLY LOTION TO AREAS TWICE A WEEK  0  . METRONIDAZOLE, TOPICAL, 0.75 % LOTN   6  . montelukast (SINGULAIR) 10 MG tablet TAKE 1 TABLET BY MOUTH AT BEDTIME. 90 tablet 1  . Multiple Vitamin (MULITIVITAMIN WITH MINERALS) TABS Take 1 tablet by mouth daily.    Marland Kitchen tiZANidine (ZANAFLEX) 4 MG tablet Take 1 tablet (4 mg total)  by mouth every 6 (six) hours as needed for muscle spasms. 30 tablet 1  . beclomethasone (QVAR) 80 MCG/ACT inhaler Inhale 2 puffs into the lungs 2 (two) times daily. (Patient not taking: Reported on 03/28/2017) 1 Inhaler 12  . calcipotriene (DOVONOX) 0.005 % cream Apply topically 2 (two) times daily. APPLY TO SKIN AS NEEDED    . SUMAtriptan (IMITREX) 100 MG tablet Take 1 tablet (100 mg total) by mouth once as needed for migraine. May repeat in 2 hours if headache persists or recurs. (Patient not taking: Reported on 03/28/2017) 9 tablet 11  . venlafaxine XR (EFFEXOR XR) 37.5 MG 24 hr capsule Take 1 capsule (37.5 mg total) by mouth daily with breakfast. (Patient not taking: Reported on 03/28/2017) 30 capsule 2   No current facility-administered medications on file prior to visit.      Review of Systems:  All pertinent positive/negative included in HPI, all other review of systems are  negative   Objective:  Physical Exam BP 112/76   Pulse 83   Ht 5' 9.5" (1.765 m)   Wt (!) 313 lb (142 kg)   BMI 45.56 kg/m  CONSTITUTIONAL: Well-developed, well-nourished female in no acute distress.  EYES: EOM intact ENT: Normocephalic CARDIOVASCULAR: Regular rate  RESPIRATORY: Normal rate.  MUSCULOSKELETAL: Normal ROM, strength equal bilaterally, muscle spasm noted throughout SKIN: Warm, dry without erythema  NEUROLOGICAL: Alert, oriented, CN II-XII grossly intact, Appropriate balance PSYCH: Normal behavior, mood  PROCEDURE:  TRIGGER POINT INJECTIONS  Procedure: Mixture of 1%  Lidocaine, marcaine and dexamethazone in a ratio of 2:2:1,  injected with 1cc each site in bilateral greater Occipital Nerves, bilateral lesser occipital nerves, bilateral cervical paraspinal muscles, bilateral trapezius.  Total amount injected: 15cc.  Pt tolerated procedure well.    Assessment & Plan:  Assessment: 1. Migraine with aura and without status migrainosus, not intractable   2. Muscle spasms of neck   3. Tension headache   4. Muscle spasm      Plan: TPI for acute, ongoing pain for days now.  Effexor - begin for prevention of migraine - will also help with comorbid factors Imitrex for acute migraine - patient advised to use more liberally Zanaflex - for muscle tension - use more liberally biofreeze advised for tension at work when she cannot do zanaflex RTC 3 months Follow-up in 3 months or sooner PRN  Paticia Stack, PA-C 03/28/2017 8:20 AM

## 2017-03-28 NOTE — Patient Instructions (Signed)
Muscle Cramps and Spasms  Muscle cramps and spasms occur when a muscle or muscles tighten and you have no control over this tightening (involuntary muscle contraction). They are a common problem and can develop in any muscle. The most common place is in the calf muscles of the leg. Muscle cramps and muscle spasms are both involuntary muscle contractions, but there are some differences between the two:  · Muscle cramps are painful. They come and go and may last a few seconds to 15 minutes. Muscle cramps are often more forceful and last longer than muscle spasms.  · Muscle spasms may or may not be painful. They may also last just a few seconds or much longer.    Certain medical conditions, such as diabetes or Parkinson disease, can make it more likely to develop cramps or spasms. However, cramps or spasms are usually not caused by a serious underlying problem. Common causes include:  · Overexertion.  · Overuse from repetitive motions, or doing the same thing over and over.  · Remaining in a certain position for a long period of time.  · Improper preparation, form, or technique while playing a sport or doing an activity.  · Dehydration.  · Injury.  · Side effects of some medicines.  · Abnormally low levels of the salts and ions in your blood (electrolytes), especially potassium and calcium. This could happen if you are taking water pills (diuretics) or if you are pregnant.    In many cases, the cause of muscle cramps or spasms is unknown.  Follow these instructions at home:  · Stay well hydrated. Drink enough fluid to keep your urine clear or pale yellow.  · Try massaging, stretching, and relaxing the affected muscle.  · If directed, apply heat to tight or tense muscles as often as told by your health care provider. Use the heat source that your health care provider recommends, such as a moist heat pack or a heating pad.  ? Place a towel between your skin and the heat source.  ? Leave the heat on for 20-30  minutes.  ? Remove the heat if your skin turns bright red. This is especially important if you are unable to feel pain, heat, or cold. You may have a greater risk of getting burned.  · If directed, put ice on the affected area. This may help if you are sore or have pain after a cramp or spasm.  ? Put ice in a plastic bag.  ? Place a towel between your skin and the bag.  ? Leave the ice on for 20 minutes, 2-3 times a day.  · Take over-the-counter and prescription medicines only as told by your health care provider.  · Pay attention to any changes in your symptoms.  Contact a health care provider if:  · Your cramps or spasms get more severe or happen more often.  · Your cramps or spasms do not improve over time.  This information is not intended to replace advice given to you by your health care provider. Make sure you discuss any questions you have with your health care provider.  Document Released: 04/12/2002 Document Revised: 11/22/2015 Document Reviewed: 07/25/2015  Elsevier Interactive Patient Education © 2017 [employer]

## 2017-04-17 ENCOUNTER — Telehealth: Payer: Self-pay | Admitting: Family Medicine

## 2017-04-17 NOTE — Telephone Encounter (Signed)
Pt dropped off form on 04/14/17.  Placed in Dr. Marthann Schiller box for signature.  Waiting for signature

## 2017-04-18 ENCOUNTER — Encounter: Payer: Self-pay | Admitting: Family Medicine

## 2017-04-18 NOTE — Telephone Encounter (Signed)
Spoke to the pt and informed her that the original is at the front desk for pick up.  She no longer wants it faxed.  She is currently out of town and not at her office to receive it.  She will come by when she is back in town.  Copy sent to scan and a copy retained for me.

## 2017-05-05 MED FILL — VENLAFAXINE HCL ER 37.5 MG: 37.5 | 30 days supply | Qty: 30 | Fill #0

## 2017-05-05 MED FILL — tiZANidine HCL 4 MG TABS: 4 | 7 days supply | Qty: 30 | Fill #0

## 2017-05-05 MED FILL — METFORMIN HCL ER 500 MG TAB: 500 | 30 days supply | Qty: 30 | Fill #2

## 2017-05-14 MED FILL — MONTELUKAST SOD 10 MG TAB: 10 | 90 days supply | Qty: 90 | Fill #1

## 2017-05-30 DIAGNOSIS — E282 Polycystic ovarian syndrome: Secondary | ICD-10-CM | POA: Diagnosis not present

## 2017-05-30 DIAGNOSIS — D509 Iron deficiency anemia, unspecified: Secondary | ICD-10-CM | POA: Diagnosis not present

## 2017-05-30 DIAGNOSIS — R7301 Impaired fasting glucose: Secondary | ICD-10-CM | POA: Diagnosis not present

## 2017-06-05 DIAGNOSIS — D2339 Other benign neoplasm of skin of other parts of face: Secondary | ICD-10-CM | POA: Diagnosis not present

## 2017-06-09 MED FILL — METFORMIN HCL ER 500 MG TAB: 500 | 90 days supply | Qty: 180 | Fill #0

## 2017-06-20 ENCOUNTER — Ambulatory Visit (INDEPENDENT_AMBULATORY_CARE_PROVIDER_SITE_OTHER): Payer: 59 | Admitting: Physician Assistant

## 2017-06-20 ENCOUNTER — Encounter: Payer: Self-pay | Admitting: Physician Assistant

## 2017-06-20 VITALS — BP 118/78 | HR 89 | Wt 315.0 lb

## 2017-06-20 DIAGNOSIS — G43109 Migraine with aura, not intractable, without status migrainosus: Secondary | ICD-10-CM | POA: Diagnosis not present

## 2017-06-20 DIAGNOSIS — M62838 Other muscle spasm: Secondary | ICD-10-CM

## 2017-06-20 DIAGNOSIS — G44209 Tension-type headache, unspecified, not intractable: Secondary | ICD-10-CM | POA: Diagnosis not present

## 2017-06-20 MED ORDER — TIZANIDINE HCL 4 MG PO TABS: 4.0000 mg | ORAL_TABLET | Freq: Four times a day (QID) | ORAL | 2 refills | Status: DC | PRN

## 2017-06-20 MED ORDER — PROPRANOLOL HCL 10 MG PO TABS: 10.0000 mg | ORAL_TABLET | Freq: Two times a day (BID) | ORAL | 2 refills | Status: DC

## 2017-06-20 NOTE — Patient Instructions (Signed)
Muscle Cramps and Spasms Muscle cramps and spasms occur when a muscle or muscles tighten and you have no control over this tightening (involuntary muscle contraction). They are a common problem and can develop in any muscle. The most common place is in the calf muscles of the leg. Muscle cramps and muscle spasms are both involuntary muscle contractions, but there are some differences between the two:  Muscle cramps are painful. They come and go and may last a few seconds to 15 minutes. Muscle cramps are often more forceful and last longer than muscle spasms.  Muscle spasms may or may not be painful. They may also last just a few seconds or much longer.  Certain medical conditions, such as diabetes or Parkinson disease, can make it more likely to develop cramps or spasms. However, cramps or spasms are usually not caused by a serious underlying problem. Common causes include:  Overexertion.  Overuse from repetitive motions, or doing the same thing over and over.  Remaining in a certain position for a long period of time.  Improper preparation, form, or technique while playing a sport or doing an activity.  Dehydration.  Injury.  Side effects of some medicines.  Abnormally low levels of the salts and ions in your blood (electrolytes), especially potassium and calcium. This could happen if you are taking water pills (diuretics) or if you are pregnant.  In many cases, the cause of muscle cramps or spasms is unknown. Follow these instructions at home:  Stay well hydrated. Drink enough fluid to keep your urine clear or pale yellow.  Try massaging, stretching, and relaxing the affected muscle.  If directed, apply heat to tight or tense muscles as often as told by your health care provider. Use the heat source that your health care provider recommends, such as a moist heat pack or a heating pad. ? Place a towel between your skin and the heat source. ? Leave the heat on for 20-30  minutes. ? Remove the heat if your skin turns bright red. This is especially important if you are unable to feel pain, heat, or cold. You may have a greater risk of getting burned.  If directed, put ice on the affected area. This may help if you are sore or have pain after a cramp or spasm. ? Put ice in a plastic bag. ? Place a towel between your skin and the bag. ? Leavethe ice on for 20 minutes, 2-3 times a day.  Take over-the-counter and prescription medicines only as told by your health care provider.  Pay attention to any changes in your symptoms. Contact a health care provider if:  Your cramps or spasms get more severe or happen more often.  Your cramps or spasms do not improve over time. This information is not intended to replace advice given to you by your health care provider. Make sure you discuss any questions you have with your health care provider. Document Released: 04/12/2002 Document Revised: 11/22/2015 Document Reviewed: 07/25/2015 Elsevier Interactive Patient Education  2018 [employer]  

## 2017-06-20 NOTE — Progress Notes (Signed)
History:  Olivia [name] is a 51 y.o. No obstetric history on file. who presents to clinic today for headache follow up.  After the last appt, she had a rough time.  At last appt she was to begin Effexor for HA prevention and she had TPI/ONB.  A day or two later, she had horrible HA lasting an entire week.  She stopped the effexor thinking that had something to do with her pain.  She has used biofreeze and found that to be helpful.  She is using zanaflex 2-4 times per month to help with chronic muscle spasm.  She gets good relief with Imitrex.  Work is continuing to be very stressful, along with termites in her house and un unhelpful crew of contractors.  She is also having weight troubles.  She is taking a class on emotional eating and learning better ways to cope.     Number of days in the last 4 weeks with:  Severe headache: 1 Moderate headache: 2 Mild headache: 25  No headache: 0   Past Medical History:  Diagnosis Date  . Allergy   . Anemia   . Asthma   . Contact lens/glasses fitting   . Dysrhythmia    PAC's-- related to meds- saw cardio 5 years ago  . Environmental allergies   . Headache(784.0)    hx migraines- hormonal  . Hiatal hernia   . Migraine aura without headache   . Pneumonia 3/13  . PONV (postoperative nausea and vomiting)   . Reflux     Social History   Social History  . Marital status: Married    Spouse name: N/A  . Number of children: N/A  . Years of education: N/A   Occupational History  . Not on file.   Social History Main Topics  . Smoking status: Never Smoker  . Smokeless tobacco: Never Used  . Alcohol use No  . Drug use: No  . Sexual activity: Yes    Birth control/ protection: IUD     Comment: Vasectomy in spouse   Other Topics Concern  . Not on file   Social History Narrative  . No narrative on file    Family History  Problem Relation Age of Onset  . Diabetes Mother   . Obesity Mother   . Colon polyps Mother   . Cancer Mother       colon  . Obesity Father   . Hypertension Father   . Obesity Sister     Allergies  Allergen Reactions  . Cephalosporins Anaphylaxis  . Eggs Or Egg-Derived Products Hives and Nausea And Vomiting    Reaction=angioedema   . Penicillins Anaphylaxis  . Sulfa Drugs Cross Reactors     Liver failure  . Effexor [Venlafaxine]     Lightheadedness, dizzy, headache    Current Outpatient Prescriptions on File Prior to Visit  Medication Sig Dispense Refill  . albuterol (PROVENTIL HFA;VENTOLIN HFA) 108 (90 BASE) MCG/ACT inhaler Inhale 2 puffs into the lungs every 6 (six) hours as needed. For shortness of breath 1 Inhaler 1  . Albuterol Sulfate (PROAIR RESPICLICK) 163 (90 BASE) MCG/ACT AEPB Inhale 2 Inhalers into the lungs 4 (four) times daily as needed. 1 each 3  . betamethasone dipropionate (DIPROLENE) 0.05 % cream Apply topically 2 (two) times daily. APPLY TO AREAS OF SKIN AS NEEDED    . calcipotriene (DOVONOX) 0.005 % cream Apply topically 2 (two) times daily. APPLY TO SKIN AS NEEDED    . cetirizine (ZYRTEC) 10 MG  tablet Take 10 mg by mouth at bedtime.     . Cholecalciferol (VITAMIN D) 2000 units CAPS Take 1 capsule by mouth daily.    . Clobetasol Propionate 0.05 % lotion APPLY LOTION TO AREAS TWICE A WEEK  0  . levonorgestrel (LILETTA, 52 MG,) 18.6 MCG/DAY IUD IUD 1 each by Intrauterine route once.    . metFORMIN (GLUCOPHAGE-XR) 500 MG 24 hr tablet     . METRONIDAZOLE, TOPICAL, 0.75 % LOTN   6  . montelukast (SINGULAIR) 10 MG tablet TAKE 1 TABLET BY MOUTH AT BEDTIME. 90 tablet 1  . Multiple Vitamin (MULITIVITAMIN WITH MINERALS) TABS Take 1 tablet by mouth daily.    . beclomethasone (QVAR) 80 MCG/ACT inhaler Inhale 2 puffs into the lungs 2 (two) times daily. (Patient not taking: Reported on 06/20/2017) 1 Inhaler 12  . SUMAtriptan (IMITREX) 100 MG tablet Take 1 tablet (100 mg total) by mouth once as needed for migraine. May repeat in 2 hours if headache persists or recurs. (Patient not taking:  Reported on 03/28/2017) 9 tablet 11  . tiZANidine (ZANAFLEX) 4 MG tablet Take 1 tablet (4 mg total) by mouth every 6 (six) hours as needed for muscle spasms. (Patient not taking: Reported on 06/20/2017) 30 tablet 1   No current facility-administered medications on file prior to visit.      Review of Systems:  All pertinent positive/negative included in HPI, all other review of systems are negative   Objective:  Physical Exam BP 118/78   Pulse 89   Wt (!) 315 lb (142.9 kg)   BMI 45.85 kg/m  CONSTITUTIONAL: Well-developed, well-nourished female in no acute distress.  EYES: EOM intact ENT: Normocephalic CARDIOVASCULAR: Regular rate  RESPIRATORY: Normal rate. MUSCULOSKELETAL: Normal ROM SKIN: Warm, dry without erythema  NEUROLOGICAL: Alert, oriented, CN II-XII grossly intact, Appropriate balance PSYCH: Normal behavior, mood  Assessment & Plan:  Assessment: 1. Migraine with aura and without status migrainosus, not intractable   2. Muscle spasm   3. Tension headache      Plan: Will trial inderal for prevention - very low dose due to her sensitivity AND asthma Will take zanaflex nightly for one week to reduce baseline muscle spasm.  Then use prn Continue imitrex prn as well as biofreeze Applauded for working toward weight loss, encouraged exercise classes rather than solo exercise, meal planning is wonderful Work toward stress reduction.  Continue with counselor Follow-up in 3 months or sooner PRN  Paticia Stack, PA-C 06/20/2017 11:25 AM

## 2017-07-24 ENCOUNTER — Encounter: Payer: Self-pay | Admitting: Internal Medicine

## 2017-08-08 MED FILL — tiZANidine HCL 4 MG TABS: 4 | 7 days supply | Qty: 30 | Fill #1

## 2017-08-26 DIAGNOSIS — Z86010 Personal history of colonic polyps: Secondary | ICD-10-CM | POA: Diagnosis not present

## 2017-08-26 DIAGNOSIS — K625 Hemorrhage of anus and rectum: Secondary | ICD-10-CM | POA: Diagnosis not present

## 2017-08-26 DIAGNOSIS — Z8371 Family history of colonic polyps: Secondary | ICD-10-CM | POA: Diagnosis not present

## 2017-08-26 DIAGNOSIS — R635 Abnormal weight gain: Secondary | ICD-10-CM | POA: Diagnosis not present

## 2017-08-26 DIAGNOSIS — Z800 Family history of malignant neoplasm of digestive organs: Secondary | ICD-10-CM | POA: Diagnosis not present

## 2017-08-26 DIAGNOSIS — Z1211 Encounter for screening for malignant neoplasm of colon: Secondary | ICD-10-CM | POA: Diagnosis not present

## 2017-08-26 MED FILL — tiZANidine HCL 4 MG TABS: 4 | 10 days supply | Qty: 40 | Fill #0

## 2017-08-26 MED FILL — metroNIDAZOLE 0.75 % LOTN: 0.75 | 30 days supply | Qty: 59 | Fill #0

## 2017-08-26 MED FILL — PROPRANOLOL 10 MG TABLET: 10 | 30 days supply | Qty: 60 | Fill #0

## 2017-08-26 MED FILL — CLOBETASOL 0.05% TOPICAL LO: 0.05 | 20 days supply | Qty: 118 | Fill #0

## 2017-09-09 ENCOUNTER — Other Ambulatory Visit: Payer: Self-pay | Admitting: Internal Medicine

## 2017-09-10 MED FILL — MONTELUKAST SOD 10 MG TAB: 10 | 90 days supply | Qty: 90 | Fill #0

## 2017-09-12 MED FILL — GAVILYTE-G SOLUTION: 236 | 1 days supply | Qty: 4000 | Fill #0

## 2017-09-16 DIAGNOSIS — Z1211 Encounter for screening for malignant neoplasm of colon: Secondary | ICD-10-CM | POA: Diagnosis not present

## 2017-09-16 LAB — HEPATIC FUNCTION PANEL
ALK PHOS: 60 (ref 25–125)
ALT: 18 (ref 7–35)
AST: 18 (ref 13–35)
BILIRUBIN DIRECT: 0.16 (ref 0.01–0.4)

## 2017-09-29 ENCOUNTER — Encounter: Payer: Self-pay | Admitting: Internal Medicine

## 2017-09-29 DIAGNOSIS — Z800 Family history of malignant neoplasm of digestive organs: Secondary | ICD-10-CM | POA: Diagnosis not present

## 2017-09-29 DIAGNOSIS — Z1211 Encounter for screening for malignant neoplasm of colon: Secondary | ICD-10-CM | POA: Diagnosis not present

## 2017-09-30 DIAGNOSIS — R7301 Impaired fasting glucose: Secondary | ICD-10-CM | POA: Diagnosis not present

## 2017-09-30 DIAGNOSIS — E785 Hyperlipidemia, unspecified: Secondary | ICD-10-CM | POA: Diagnosis not present

## 2017-09-30 DIAGNOSIS — D509 Iron deficiency anemia, unspecified: Secondary | ICD-10-CM | POA: Diagnosis not present

## 2017-10-09 ENCOUNTER — Encounter: Payer: Self-pay | Admitting: Internal Medicine

## 2017-11-11 ENCOUNTER — Telehealth: Payer: Self-pay | Admitting: Internal Medicine

## 2017-11-11 NOTE — Telephone Encounter (Signed)
Okay for transfer 

## 2017-11-11 NOTE — Telephone Encounter (Signed)
Ok to transfer PCP?  Please advise.

## 2017-11-11 NOTE — Telephone Encounter (Signed)
Please forward to Dr. Juleen China also for approval.

## 2017-11-11 NOTE — Telephone Encounter (Signed)
Copied from [place] #[id number]. Topic: Inquiry >> Nov 11, 2017  1:28 PM Boyd Kerbs wrote: Reason for CRM: requesting to be transferred to Dr. Juleen China at [facility].  She loves Dr. Raliegh Ip but is wanting to see a female doctor and [facility] is a lot closer to her house.  Please call patient if approved`

## 2017-11-11 NOTE — Telephone Encounter (Signed)
Okay transfer.

## 2017-11-11 NOTE — Telephone Encounter (Signed)
Please contact patient to make a transfer of care appointment.

## 2017-11-13 NOTE — Telephone Encounter (Signed)
Message left for the pt to call back.  Both providers have agreed to transfer.  Pt needs to be scheduled with new provider.  CRM created for pt to be scheduled when she calls back.  No further action required.  Will close note.

## 2017-11-28 MED FILL — METFORMIN HCL ER 500 MG TAB: 500 | 90 days supply | Qty: 180 | Fill #1

## 2017-12-07 ENCOUNTER — Encounter: Payer: Self-pay | Admitting: Family Medicine

## 2017-12-07 DIAGNOSIS — Z975 Presence of (intrauterine) contraceptive device: Secondary | ICD-10-CM | POA: Insufficient documentation

## 2017-12-07 DIAGNOSIS — D509 Iron deficiency anemia, unspecified: Secondary | ICD-10-CM | POA: Insufficient documentation

## 2017-12-07 NOTE — Progress Notes (Signed)
Olivia [name] is a 52 y.o. female is here to [facility].   Patient Care Team: Briscoe Deutscher, DO as PCP - General (Family Medicine)   History of Present Illness:   HPI: Patient comes in today to Establish care with Dr. Juleen China. 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 She is wanting to talk about her all over health today. She has had Lap Band surgery in the past. She stated that she has lost the 100 pounds from the surgery and then gained it back.   Health Maintenance Due  Topic Date Due  . HIV Screening  03/08/1983  . MAMMOGRAM  03/07/1986  . TETANUS/TDAP  03/08/1987  . PAP SMEAR  03/07/1989  . INFLUENZA VACCINE  06/04/2017   Depression screen PHQ 2/9 12/08/2017  Decreased Interest 0  Down, Depressed, Hopeless 2  PHQ - 2 Score 2  Altered sleeping 3  Tired, decreased energy 3  Change in appetite 1  Feeling bad or failure about yourself  3  Trouble concentrating 3  Moving slowly or fidgety/restless 3  Suicidal thoughts 0  PHQ-9 Score 18  Difficult doing work/chores Somewhat difficult   PMHx, SurgHx, SocialHx, Medications, and Allergies were reviewed in the Visit Navigator and updated as appropriate.   Past Medical History:  Diagnosis Date  . Asthma   . Contact lens/glasses   . Environmental allergies   . Hiatal hernia   . HLD (hyperlipidemia) 04/15/2008  . Microcytic anemia   . Migraine, hormonal   . PACs (premature atrial contraction), s/p Cardiology evaluation   . PCOS (polycystic ovarian syndrome) 10/25/2014  . Pneumonia 3/13  . PONV (postoperative nausea and vomiting)   . Reflux   . Seasonal allergies    Past Surgical History:  Procedure Laterality Date  . HIATAL HERNIA REPAIR  04/14/2012   Procedure: LAPAROSCOPIC REPAIR OF HIATAL HERNIA;  Surgeon: Pedro Earls, MD;  Location: [facility];  Service: General;  Laterality: N/A;  Hiatal Hernia Repair-Replacement of Lap Band  . LAPAROSCOPIC GASTRIC BANDING  08/08/2008  . TONSILLECTOMY  1981   Family History    Problem Relation Age of Onset  . Diabetes Mother   . Obesity Mother   . Colon polyps Mother   . Colon cancer Mother   . Obesity Father   . Hypertension Father   . Obesity Sister    Social History   Tobacco Use  . Smoking status: Never Smoker  . Smokeless tobacco: Never Used  Substance Use Topics  . Alcohol use: No  . Drug use: No   Current Medications and Allergies:   .  albuterol (PROVENTIL HFA;VENTOLIN HFA) 108 (90 BASE) MCG/ACT inhaler, Inhale 2 puffs into the lungs every 6 (six) hours as needed. For shortness of breath, Disp: 1 Inhaler, Rfl: 1 .  cetirizine (ZYRTEC) 10 MG tablet, Take 10 mg by mouth at bedtime. , Disp: , Rfl:  .  Cholecalciferol (VITAMIN D) 2000 units CAPS, Take 1 capsule by mouth daily., Disp: , Rfl:  .  Clobetasol Propionate 0.05 % lotion, APPLY LOTION TO AREAS TWICE A WEEK, Disp: , Rfl: 0 .  levonorgestrel (LILETTA, 52 MG,) 18.6 MCG/DAY IUD IUD, 1 each by Intrauterine route once., Disp: , Rfl:  .  metFORMIN (GLUCOPHAGE-XR) 500 MG 24 hr tablet, , Disp: , Rfl:  .  METRONIDAZOLE, TOPICAL, 0.75 % LOTN, , Disp: , Rfl: 6 .  montelukast (SINGULAIR) 10 MG tablet, TAKE 1 TABLET BY MOUTH AT BEDTIME., Disp: 90 tablet, Rfl: 1 .  Multiple  Vitamin (MULITIVITAMIN WITH MINERALS) TABS, Take 1 tablet by mouth daily., Disp: , Rfl:  .  tiZANidine (ZANAFLEX) 4 MG tablet, Take 1 tablet (4 mg total) by mouth every 6 (six) hours as needed for muscle spasms., Disp: 40 tablet, Rfl: 2  Allergies  Allergen Reactions  . Cephalosporins Anaphylaxis  . Eggs Or Egg-Derived Products Hives and Other (See Comments)    Angioedema  . Penicillins Anaphylaxis  . Sulfa Drugs Cross Reactors     Liver failure  . Effexor [Venlafaxine] Other (See Comments)    Lightheadedness, dizzy, headache   Review of Systems:   Pertinent items are noted in the HPI. Otherwise, ROS is negative.  Vitals:   Vitals:   12/08/17 0745  BP: 138/82  Pulse: 68  Temp: 98.1 F (36.7 C)  TempSrc: Oral  SpO2:  94%  Weight: (!) 309 lb 12.8 oz (140.5 kg)  Height: 5' 9.5" (1.765 m)     Body mass index is 45.09 kg/m.  Physical Exam:   Physical Exam  Constitutional: She is oriented to person, place, and time. She appears well-developed and well-nourished. No distress.  HENT:  Head: Normocephalic and atraumatic.  Right Ear: External ear normal.  Left Ear: External ear normal.  Nose: Nose normal.  Mouth/Throat: Oropharynx is clear and moist.  Eyes: Conjunctivae and EOM are normal. Pupils are equal, round, and reactive to light.  Neck: Normal range of motion. Neck supple. No thyromegaly present.  Cardiovascular: Normal rate, regular rhythm, normal heart sounds and intact distal pulses.  Pulmonary/Chest: Effort normal and breath sounds normal.  Abdominal: Soft. Bowel sounds are normal.  Musculoskeletal:       Lumbar back: She exhibits decreased range of motion and bony tenderness.       Back:  Lymphadenopathy:    She has no cervical adenopathy.  Neurological: She is alert and oriented to person, place, and time.  Skin: Skin is warm and dry. Capillary refill takes less than 2 seconds.  Psychiatric: She has a normal mood and affect. Her behavior is normal.  Nursing note and vitals reviewed.  Results for orders placed or performed in visit on 12/08/17  CBC with Differential/Platelet  Result Value Ref Range   WBC 6.4 4.0 - 10.5 K/uL   RBC 5.10 3.87 - 5.11 Mil/uL   Hemoglobin 14.4 12.0 - 15.0 g/dL   HCT 42.7 36.0 - 46.0 %   MCV 83.7 78.0 - 100.0 fl   MCHC 33.8 30.0 - 36.0 g/dL   RDW 14.1 11.5 - 15.5 %   Platelets 273.0 150.0 - 400.0 K/uL   Neutrophils Relative % 60.8 43.0 - 77.0 %   Lymphocytes Relative 28.8 12.0 - 46.0 %   Monocytes Relative 6.9 3.0 - 12.0 %   Eosinophils Relative 2.7 0.0 - 5.0 %   Basophils Relative 0.8 0.0 - 3.0 %   Neutro Abs 3.9 1.4 - 7.7 K/uL   Lymphs Abs 1.9 0.7 - 4.0 K/uL   Monocytes Absolute 0.4 0.1 - 1.0 K/uL   Eosinophils Absolute 0.2 0.0 - 0.7 K/uL    Basophils Absolute 0.1 0.0 - 0.1 K/uL  Comprehensive metabolic panel  Result Value Ref Range   Sodium 138 135 - 145 mEq/L   Potassium 4.1 3.5 - 5.1 mEq/L   Chloride 106 96 - 112 mEq/L   CO2 24 19 - 32 mEq/L   Glucose, Bld 98 70 - 99 mg/dL   BUN 12 6 - 23 mg/dL   Creatinine, Ser 0.68 0.40 - 1.20 mg/dL  Total Bilirubin 0.7 0.2 - 1.2 mg/dL   Alkaline Phosphatase 48 39 - 117 U/L   AST 14 0 - 37 U/L   ALT 11 0 - 35 U/L   Total Protein 6.8 6.0 - 8.3 g/dL   Albumin 3.9 3.5 - 5.2 g/dL   Calcium 9.9 8.4 - 10.5 mg/dL   GFR 97.44 >60.00 mL/min  Vitamin B12  Result Value Ref Range   Vitamin B-12 391 211 - 911 pg/mL  TSH  Result Value Ref Range   TSH 2.21 0.35 - 4.50 uIU/mL  Hemoglobin A1c  Result Value Ref Range   Hgb A1c MFr Bld 5.5 4.6 - 6.5 %  Lipid panel  Result Value Ref Range   Cholesterol 229 (H) 0 - 200 mg/dL   Triglycerides 112.0 0.0 - 149.0 mg/dL   HDL 78.10 >39.00 mg/dL   VLDL 22.4 0.0 - 40.0 mg/dL   LDL Cholesterol 128 (H) 0 - 99 mg/dL   Total CHOL/HDL Ratio 3    NonHDL 150.82   VITAMIN D 25 Hydroxy (Vit-D Deficiency, Fractures)  Result Value Ref Range   VITD 19.28 (L) 30.00 - 100.00 ng/mL  Iron, TIBC and Ferritin Panel  Result Value Ref Range   Iron 56 40 - 190 mcg/dL   TIBC 387 250 - 450 mcg/dL (calc)   %SAT 14 11 - 50 % (calc)   Ferritin 16 10 - 232 ng/mL  Thyroid peroxidase antibody  Result Value Ref Range   Thyroperoxidase Ab SerPl-aCnc 1 <9 IU/mL   Assessment and Plan:   1. Pure hypercholesterolemia  Lab Results  Component Value Date   CHOL 229 (H) 12/08/2017   HDL 78.10 12/08/2017   LDLCALC 128 (H) 12/08/2017   LDLDIRECT 156.8 05/04/2013   TRIG 112.0 12/08/2017   CHOLHDL 3 12/08/2017   The 10-year ASCVD risk score Mikey Bussing DC Jr., et al., 2013) is: 1%   Values used to calculate the score:     Age: 9 years     Sex: Female     Is Non-Hispanic African American: No     Diabetic: No     Tobacco smoker: No     Systolic Blood Pressure: 361 mmHg      Is BP treated: No     HDL Cholesterol: 78.1 mg/dL     Total Cholesterol: 229 mg/dL   - Lipid panel  2. Microcytic anemia  Lab Results  Component Value Date   WBC 6.4 12/08/2017   HGB 14.4 12/08/2017   HCT 42.7 12/08/2017   MCV 83.7 12/08/2017   PLT 273.0 12/08/2017   Lab Results  Component Value Date   IRON 56 12/08/2017   TIBC 387 12/08/2017   FERRITIN 16 12/08/2017   Stable.   - CBC with Differential/Platelet - Iron, TIBC and Ferritin Panel  3. IUD (intrauterine device) in place, Liletta  4. Vitamin D deficiency Abigal was informed that low vitamin D levels contributes to fatigue and are associated with obesity, breast, and colon cancer. Current vitamin D level is 19. Goal vitamin D level > 40. She will supplement as below and recheck in 3-6 months.  [x]   Vitamin D 50,000 IU x 12 weeks, then 2000 IU daily.  - VITAMIN D 25 Hydroxy (Vit-D Deficiency, Fractures)  5. Scoliosis, unspecified scoliosis type, unspecified spinal region Worsening back pain. See below.   6. Morbid obesity ([place]) The patient is asked to make an attempt to improve diet and exercise patterns to aid in medical management of this problem. She  is interested in medication management.  - CBC with Differential/Platelet - Comprehensive metabolic panel - Vitamin B14 - TSH - Iron, TIBC and Ferritin Panel - Thyroid peroxidase antibody  7. Psoriasis Severe. Likely associated with psoriatic arthritis. Treatment as below for flare. Referral to Dermatology for better long-term management.  - predniSONE (DELTASONE) 5 MG tablet; Please take 6,6,5,5,4,4,3,3,2,2,1,1 then finished.  Dispense: 42 tablet; Refill: 0 - Ambulatory referral to Dermatology  8. PCOS (polycystic ovarian syndrome) See Morbid Obesity above. Did well on Metformin in the past.   - CBC with Differential/Platelet - Comprehensive metabolic panel  9. Hyperglycemia Lab Results  Component Value Date   HGBA1C 5.5 12/08/2017   -  Hemoglobin A1c  10. Chronic low back pain with sciatica, sciatica laterality unspecified, unspecified back pain laterality Patient complains of chronic low back pain. The patient first noted symptoms several months ago. The pain is rated moderate, severe, and is located at the left lumbar area. The pain is described as aching, sharp and stiffness and occurs all day. The symptoms have been progressive. Symptoms are exacerbated by extension and flexion. Factors which relieve the pain include nothing. Other associated symptoms include burning pain in the left leg. Previous history of symptoms: the problem is long-standing.  Treatment efforts have included ice, OTC NSAIDS and PT, without relief.  - MR Lumbar Spine Wo Contrast; Future  11. Fatigue, unspecified type - CBC with Differential/Platelet - Vitamin B12 - TSH - Thyroid peroxidase antibody  12. Dysthymia Elevated PHQ9. She declines medication. She is interested in counseling - Trey Paula information provided.   . Reviewed expectations re: course of current medical issues. . Discussed self-management of symptoms. . Outlined signs and symptoms indicating need for more acute intervention. . Patient verbalized understanding and all questions were answered. Marland Kitchen Health Maintenance issues including appropriate healthy diet, exercise, and smoking avoidance were discussed with patient. . See orders for this visit as documented in the electronic medical record. . Patient received an After Visit Summary.  CMA served as Education administrator during this visit. History, Physical, and Plan performed by medical provider. The above documentation has been reviewed and is accurate and complete. Briscoe Deutscher, D.O.  Briscoe Deutscher, DO [place], [facility] 12/11/2017

## 2017-12-08 ENCOUNTER — Encounter: Payer: Self-pay | Admitting: Family Medicine

## 2017-12-08 ENCOUNTER — Ambulatory Visit (INDEPENDENT_AMBULATORY_CARE_PROVIDER_SITE_OTHER): Payer: No Typology Code available for payment source | Admitting: Family Medicine

## 2017-12-08 VITALS — BP 138/82 | HR 68 | Temp 98.1°F | Ht 69.5 in | Wt 309.8 lb

## 2017-12-08 DIAGNOSIS — M5440 Lumbago with sciatica, unspecified side: Secondary | ICD-10-CM

## 2017-12-08 DIAGNOSIS — F341 Dysthymic disorder: Secondary | ICD-10-CM

## 2017-12-08 DIAGNOSIS — Z975 Presence of (intrauterine) contraceptive device: Secondary | ICD-10-CM

## 2017-12-08 DIAGNOSIS — M419 Scoliosis, unspecified: Secondary | ICD-10-CM

## 2017-12-08 DIAGNOSIS — G8929 Other chronic pain: Secondary | ICD-10-CM

## 2017-12-08 DIAGNOSIS — R5383 Other fatigue: Secondary | ICD-10-CM

## 2017-12-08 DIAGNOSIS — E559 Vitamin D deficiency, unspecified: Secondary | ICD-10-CM | POA: Diagnosis not present

## 2017-12-08 DIAGNOSIS — D509 Iron deficiency anemia, unspecified: Secondary | ICD-10-CM

## 2017-12-08 DIAGNOSIS — E282 Polycystic ovarian syndrome: Secondary | ICD-10-CM | POA: Diagnosis not present

## 2017-12-08 DIAGNOSIS — E7800 Pure hypercholesterolemia, unspecified: Secondary | ICD-10-CM | POA: Diagnosis not present

## 2017-12-08 DIAGNOSIS — L409 Psoriasis, unspecified: Secondary | ICD-10-CM | POA: Diagnosis not present

## 2017-12-08 DIAGNOSIS — R739 Hyperglycemia, unspecified: Secondary | ICD-10-CM

## 2017-12-08 LAB — CBC WITH DIFFERENTIAL/PLATELET
Basophils Absolute: 0.1 10*3/uL (ref 0.0–0.1)
Basophils Relative: 0.8 % (ref 0.0–3.0)
Eosinophils Absolute: 0.2 10*3/uL (ref 0.0–0.7)
Eosinophils Relative: 2.7 % (ref 0.0–5.0)
HCT: 42.7 % (ref 36.0–46.0)
Hemoglobin: 14.4 g/dL (ref 12.0–15.0)
Lymphocytes Relative: 28.8 % (ref 12.0–46.0)
Lymphs Abs: 1.9 10*3/uL (ref 0.7–4.0)
MCHC: 33.8 g/dL (ref 30.0–36.0)
MCV: 83.7 fl (ref 78.0–100.0)
Monocytes Absolute: 0.4 10*3/uL (ref 0.1–1.0)
Monocytes Relative: 6.9 % (ref 3.0–12.0)
Neutro Abs: 3.9 10*3/uL (ref 1.4–7.7)
Neutrophils Relative %: 60.8 % (ref 43.0–77.0)
Platelets: 273 10*3/uL (ref 150.0–400.0)
RBC: 5.1 Mil/uL (ref 3.87–5.11)
RDW: 14.1 % (ref 11.5–15.5)
WBC: 6.4 10*3/uL (ref 4.0–10.5)

## 2017-12-08 LAB — COMPREHENSIVE METABOLIC PANEL
ALT: 11 U/L (ref 0–35)
AST: 14 U/L (ref 0–37)
Albumin: 3.9 g/dL (ref 3.5–5.2)
Alkaline Phosphatase: 48 U/L (ref 39–117)
BUN: 12 mg/dL (ref 6–23)
CO2: 24 mEq/L (ref 19–32)
Calcium: 9.9 mg/dL (ref 8.4–10.5)
Chloride: 106 mEq/L (ref 96–112)
Creatinine, Ser: 0.68 mg/dL (ref 0.40–1.20)
GFR: 97.44 mL/min (ref >60.00)
Glucose, Bld: 98 mg/dL (ref 70–99)
Potassium: 4.1 mEq/L (ref 3.5–5.1)
Sodium: 138 mEq/L (ref 135–145)
Total Bilirubin: 0.7 mg/dL (ref 0.2–1.2)
Total Protein: 6.8 g/dL (ref 6.0–8.3)

## 2017-12-08 LAB — LIPID PANEL
Cholesterol: 229 mg/dL — ABNORMAL HIGH (ref 0–200)
HDL: 78.1 mg/dL (ref >39.00)
LDL Cholesterol: 128 mg/dL — ABNORMAL HIGH (ref 0–99)
NonHDL: 150.82
Total CHOL/HDL Ratio: 3
Triglycerides: 112 mg/dL (ref 0.0–149.0)
VLDL: 22.4 mg/dL (ref 0.0–40.0)

## 2017-12-08 LAB — VITAMIN B12: Vitamin B-12: 391 pg/mL (ref 211–911)

## 2017-12-08 LAB — TSH: TSH: 2.21 u[IU]/mL (ref 0.35–4.50)

## 2017-12-08 LAB — VITAMIN D 25 HYDROXY (VIT D DEFICIENCY, FRACTURES): VITD: 19.28 ng/mL — ABNORMAL LOW (ref 30.00–100.00)

## 2017-12-08 LAB — HEMOGLOBIN A1C: Hgb A1c MFr Bld: 5.5 % (ref 4.6–6.5)

## 2017-12-08 MED ORDER — PREDNISONE 5 MG PO TABS: ORAL_TABLET | ORAL | 0 refills | Status: DC

## 2017-12-09 ENCOUNTER — Encounter: Payer: Self-pay | Admitting: Family Medicine

## 2017-12-09 LAB — IRON,TIBC AND FERRITIN PANEL
%SAT: 14 % (calc) (ref 11–50)
Ferritin: 16 ng/mL (ref 10–232)
Iron: 56 ug/dL (ref 40–190)
TIBC: 387 mcg/dL (calc) (ref 250–450)

## 2017-12-09 LAB — THYROID PEROXIDASE ANTIBODY: Thyroperoxidase Ab SerPl-aCnc: 1 IU/mL (ref <9)

## 2017-12-11 MED ORDER — CHOLECALCIFEROL 1.25 MG (50000 UT) PO TABS: ORAL_TABLET | ORAL | 0 refills | Status: DC

## 2017-12-11 MED FILL — VIT D3-50 50,000 UNITS CAPS: 1.25 MG | 84 days supply | Qty: 12 | Fill #0

## 2017-12-16 ENCOUNTER — Other Ambulatory Visit: Payer: Self-pay

## 2017-12-16 MED ORDER — METFORMIN HCL ER 500 MG PO TB24: 1000.0000 mg | ORAL_TABLET | Freq: Every day | ORAL | 3 refills | Status: DC

## 2017-12-19 MED FILL — MONTELUKAST SOD 10 MG TAB: 10 | 90 days supply | Qty: 90 | Fill #1

## 2017-12-25 ENCOUNTER — Ambulatory Visit
Admission: RE | Admit: 2017-12-25 | Discharge: 2017-12-25 | Disposition: A | Discharge status: Home / Self Care | Payer: No Typology Code available for payment source | Source: Ambulatory Visit | Attending: Family Medicine | Admitting: Family Medicine

## 2017-12-25 DIAGNOSIS — M5440 Lumbago with sciatica, unspecified side: Principal | ICD-10-CM

## 2017-12-25 DIAGNOSIS — G8929 Other chronic pain: Secondary | ICD-10-CM

## 2017-12-30 ENCOUNTER — Other Ambulatory Visit: Payer: Self-pay

## 2017-12-30 DIAGNOSIS — M545 Low back pain: Secondary | ICD-10-CM

## 2018-01-05 ENCOUNTER — Ambulatory Visit (INDEPENDENT_AMBULATORY_CARE_PROVIDER_SITE_OTHER): Payer: No Typology Code available for payment source | Admitting: Family Medicine

## 2018-01-05 ENCOUNTER — Encounter: Payer: Self-pay | Admitting: Family Medicine

## 2018-01-05 VITALS — BP 112/76 | HR 73 | Temp 97.8°F | Ht 69.5 in | Wt 303.4 lb

## 2018-01-05 DIAGNOSIS — M5440 Lumbago with sciatica, unspecified side: Secondary | ICD-10-CM | POA: Diagnosis not present

## 2018-01-05 DIAGNOSIS — G8929 Other chronic pain: Secondary | ICD-10-CM

## 2018-01-05 DIAGNOSIS — E7800 Pure hypercholesterolemia, unspecified: Secondary | ICD-10-CM

## 2018-01-05 DIAGNOSIS — E559 Vitamin D deficiency, unspecified: Secondary | ICD-10-CM | POA: Diagnosis not present

## 2018-01-05 DIAGNOSIS — D509 Iron deficiency anemia, unspecified: Secondary | ICD-10-CM | POA: Diagnosis not present

## 2018-01-05 DIAGNOSIS — L409 Psoriasis, unspecified: Secondary | ICD-10-CM | POA: Insufficient documentation

## 2018-01-05 MED ORDER — PHENTERMINE-TOPIRAMATE ER 3.75-23 MG PO CP24: ORAL_CAPSULE | ORAL | 0 refills | Status: DC

## 2018-01-05 MED ORDER — ALBUTEROL SULFATE HFA 108 (90 BASE) MCG/ACT IN AERS: 2.0000 | INHALATION_SPRAY | Freq: Four times a day (QID) | RESPIRATORY_TRACT | 1 refills | Status: DC | PRN

## 2018-01-05 NOTE — Progress Notes (Signed)
Olivia [name] is a 52 y.o. female is here for follow up.  History of Present Illness:   HPI: Since her last visit, the patient has been working on a ketogenic diet.  She feels that she is doing well.  She has lost 6 pounds so far.  She has not been exercising due to chronic back pain and is interested in physical therapy referral for this.  She understands that she needs to work on her core strength.  Patient says that she generally struggles about this point in her diet change as her menses usually makes her more hungry and causes some headaches.  She is okay with increasing metformin to 1000 mg Exar daily.  Health maintenance reviewed.  The patient does see gynecology and is due for a much of her health maintenance.  She will have redirect those results to [place] as soon as they are done.  ROS: Per HPI, otherwise negative.  Health Maintenance Due  Topic Date Due  . HIV Screening  03/08/1983  . MAMMOGRAM  03/07/1986  . TETANUS/TDAP  03/08/1987  . PAP SMEAR  03/07/1989  . INFLUENZA VACCINE  06/04/2017   Depression screen [facility] 2/9 12/08/2017 07/12/2016  Decreased Interest 0 3  Down, Depressed, Hopeless 2 1  PHQ - 2 Score 2 4  Altered sleeping 3 3  Tired, decreased energy 3 3  Change in appetite 1 3  Feeling bad or failure about yourself  3 2  Trouble concentrating 3 2  Moving slowly or fidgety/restless 3 2  Suicidal thoughts 0 0  PHQ-9 Score 18 19  Difficult doing work/chores Somewhat difficult Very difficult   PMHx, SurgHx, SocialHx, FamHx, Medications, and Allergies were reviewed in the Visit Navigator and updated as appropriate.   Patient Active Problem List   Diagnosis Date Noted  . Vitamin D deficiency, currently on 50K IU x 12 weeks 01/2018 01/05/2018  . Psoriasis, clobetasol, referred to Dermatology 01/05/2018  . Chronic low back pain with sciatica 01/05/2018  . Hisotry of microcytic anemia, high risk after bariatric surgery 12/07/2017  . IUD (intrauterine device) in  place, Liletta 12/07/2017  . Tension headache 10/04/2016  . PCOS, Metformin, IUD 10/25/2014  . Migraine with aura 01/11/2014  . Lapband APS resited with [facility] repair June 2013 05/08/2012  . HLD (hyperlipidemia), last checked 12/08/17, LDL 128, HDL 78 04/15/2008  . Morbid obesity ([place]) 04/15/2008  . Allergic rhinitis, Zyrtec and Singulair 04/15/2008  . Mild intermittent asthma, albuterol prn 04/15/2008   Social History   Tobacco Use  . Smoking status: Never Smoker  . Smokeless tobacco: Never Used  Substance Use Topics  . Alcohol use: No  . Drug use: No   Current Medications and Allergies:   .  albuterol (PROVENTIL HFA;VENTOLIN HFA) 108 (90 BASE) MCG/ACT inhaler, Inhale 2 puffs into the lungs every 6 (six) hours as needed. For shortness of breath, Disp: 1 Inhaler, Rfl: 1 .  cetirizine (ZYRTEC) 10 MG tablet, Take 10 mg by mouth at bedtime. , Disp: , Rfl:  .  Cholecalciferol (VITAMIN D) 2000 units CAPS, Take 1 capsule by mouth daily., Disp: , Rfl:  .  Cholecalciferol 50000 units TABS, 50,000 units PO qwk for 12 weeks., Disp: 12 tablet, Rfl: 0 .  Clobetasol Propionate 0.05 % lotion, APPLY LOTION TO AREAS TWICE A WEEK, Disp: , Rfl: 0 .  D3-50 50000 units capsule, Take 1 tablet by mouth once a week., Disp: , Rfl: 0 .  levonorgestrel (LILETTA, 52 MG,) 18.6 MCG/DAY IUD  IUD, 1 each by Intrauterine route once., Disp: , Rfl:  .  metFORMIN (GLUCOPHAGE-XR) 500 MG 24 hr tablet, Take 2 tablets (1,000 mg total) by mouth at bedtime., Disp: 60 tablet, Rfl: 3 .  METRONIDAZOLE, TOPICAL, 0.75 % LOTN, , Disp: , Rfl: 6 .  montelukast (SINGULAIR) 10 MG tablet, TAKE 1 TABLET BY MOUTH AT BEDTIME., Disp: 90 tablet, Rfl: 1 .  Multiple Vitamin (MULITIVITAMIN WITH MINERALS) TABS, Take 1 tablet by mouth daily., Disp: , Rfl:  .  predniSONE (DELTASONE) 5 MG tablet, Please take 6,6,5,5,4,4,3,3,2,2,1,1 then finished., Disp: 42 tablet, Rfl: 0 .  tiZANidine (ZANAFLEX) 4 MG tablet, Take 1 tablet (4 mg total) by mouth every 6  (six) hours as needed for muscle spasms., Disp: 40 tablet, Rfl: 2  Allergies  Allergen Reactions  . Cephalosporins Anaphylaxis  . Eggs Or Egg-Derived Products Hives and Other (See Comments)    Angioedema  . Penicillins Anaphylaxis  . Sulfa Drugs Cross Reactors     Liver failure  . Effexor [Venlafaxine] Other (See Comments)    Lightheadedness, dizzy, headache   Review of Systems   Pertinent items are noted in the HPI. Otherwise, ROS is negative.  Vitals:   Vitals:   01/05/18 0727  BP: 112/76  Pulse: 73  Temp: 97.8 F (36.6 C)  TempSrc: Oral  SpO2: 95%  Weight: (!) 303 lb 6.4 oz (137.6 kg)  Height: 5' 9.5" (1.765 m)     Body mass index is 44.16 kg/m.  Physical Exam:   Physical Exam  Constitutional: She is oriented to person, place, and time. She appears well-developed and well-nourished. No distress.  HENT:  Head: Normocephalic and atraumatic.  Right Ear: External ear normal.  Left Ear: External ear normal.  Nose: Nose normal.  Mouth/Throat: Oropharynx is clear and moist.  Eyes: Conjunctivae and EOM are normal. Pupils are equal, round, and reactive to light.  Neck: Normal range of motion. Neck supple. No thyromegaly present.  Cardiovascular: Normal rate, regular rhythm, normal heart sounds and intact distal pulses.  Pulmonary/Chest: Effort normal and breath sounds normal.  Abdominal: Soft. Bowel sounds are normal.  Musculoskeletal: Normal range of motion.  Lymphadenopathy:    She has no cervical adenopathy.  Neurological: She is alert and oriented to person, place, and time.  Skin: Skin is warm and dry. Capillary refill takes less than 2 seconds.  Psychiatric: She has a normal mood and affect. Her behavior is normal.  Nursing note and vitals reviewed.   Results for orders placed or performed in visit on 12/08/17  CBC with Differential/Platelet  Result Value Ref Range   WBC 6.4 4.0 - 10.5 K/uL   RBC 5.10 3.87 - 5.11 Mil/uL   Hemoglobin 14.4 12.0 - 15.0 g/dL     HCT 42.7 36.0 - 46.0 %   MCV 83.7 78.0 - 100.0 fl   MCHC 33.8 30.0 - 36.0 g/dL   RDW 14.1 11.5 - 15.5 %   Platelets 273.0 150.0 - 400.0 K/uL   Neutrophils Relative % 60.8 43.0 - 77.0 %   Lymphocytes Relative 28.8 12.0 - 46.0 %   Monocytes Relative 6.9 3.0 - 12.0 %   Eosinophils Relative 2.7 0.0 - 5.0 %   Basophils Relative 0.8 0.0 - 3.0 %   Neutro Abs 3.9 1.4 - 7.7 K/uL   Lymphs Abs 1.9 0.7 - 4.0 K/uL   Monocytes Absolute 0.4 0.1 - 1.0 K/uL   Eosinophils Absolute 0.2 0.0 - 0.7 K/uL   Basophils Absolute 0.1 0.0 - 0.1  K/uL  Comprehensive metabolic panel  Result Value Ref Range   Sodium 138 135 - 145 mEq/L   Potassium 4.1 3.5 - 5.1 mEq/L   Chloride 106 96 - 112 mEq/L   CO2 24 19 - 32 mEq/L   Glucose, Bld 98 70 - 99 mg/dL   BUN 12 6 - 23 mg/dL   Creatinine, Ser 0.68 0.40 - 1.20 mg/dL   Total Bilirubin 0.7 0.2 - 1.2 mg/dL   Alkaline Phosphatase 48 39 - 117 U/L   AST 14 0 - 37 U/L   ALT 11 0 - 35 U/L   Total Protein 6.8 6.0 - 8.3 g/dL   Albumin 3.9 3.5 - 5.2 g/dL   Calcium 9.9 8.4 - 10.5 mg/dL   GFR 97.44 >60.00 mL/min  Vitamin B12  Result Value Ref Range   Vitamin B-12 391 211 - 911 pg/mL  TSH  Result Value Ref Range   TSH 2.21 0.35 - 4.50 uIU/mL  Hemoglobin A1c  Result Value Ref Range   Hgb A1c MFr Bld 5.5 4.6 - 6.5 %  Lipid panel  Result Value Ref Range   Cholesterol 229 (H) 0 - 200 mg/dL   Triglycerides 112.0 0.0 - 149.0 mg/dL   HDL 78.10 >39.00 mg/dL   VLDL 22.4 0.0 - 40.0 mg/dL   LDL Cholesterol 128 (H) 0 - 99 mg/dL   Total CHOL/HDL Ratio 3    NonHDL 150.82   VITAMIN D 25 Hydroxy (Vit-D Deficiency, Fractures)  Result Value Ref Range   VITD 19.28 (L) 30.00 - 100.00 ng/mL  Iron, TIBC and Ferritin Panel  Result Value Ref Range   Iron 56 40 - 190 mcg/dL   TIBC 387 250 - 450 mcg/dL (calc)   %SAT 14 11 - 50 % (calc)   Ferritin 16 10 - 232 ng/mL  Thyroid peroxidase antibody  Result Value Ref Range   Thyroperoxidase Ab SerPl-aCnc 1 <9 IU/mL   Assessment and  Plan:   Current Problem List updated with notes today.   Patient Active Problem List   Diagnosis Date Noted  . Vitamin D deficiency, currently on 50K IU x 12 weeks 01/2018 01/05/2018  . Psoriasis, clobetasol, referred to Dermatology 01/05/2018  . Chronic low back pain with sciatica 01/05/2018  . Hisotry of microcytic anemia, high risk after bariatric surgery 12/07/2017  . IUD (intrauterine device) in place, Liletta 12/07/2017  . Tension headache 10/04/2016  . PCOS, Metformin, IUD 10/25/2014  . Migraine with aura 01/11/2014  . Lapband APS resited with [facility] repair June 2013 05/08/2012  . HLD (hyperlipidemia), last checked 12/08/17, LDL 128, HDL 78 04/15/2008  . Morbid obesity ([place]) 04/15/2008  . Allergic rhinitis, Zyrtec and Singulair 04/15/2008  . Mild intermittent asthma, albuterol prn 04/15/2008   Olivia [name] was seen today for follow-up.  Diagnoses and all orders for this visit:  Vitamin D deficiency Comments: Tolerating weekly vitamin D.  Pure hypercholesterolemia Comments: With high HDL.  Microcytic anemia Comments: Continue iron x 6 months.  Morbid obesity ([place]) Comments: Trial as below.  If the patient has any palpitations, will stop immediately.  May try Topamax alone.  Encouraged healthy eating patterns and regular exercise. Orders: -     Phentermine-Topiramate 3.75-23 MG CP24; One po daily x 2 weeks  Chronic low back pain with sciatica -     Ambulatory referral to Physical Therapy  Orders Placed This Encounter  Procedures  . Ambulatory referral to Physical Therapy   Meds ordered this encounter  Medications  . albuterol (PROVENTIL  HFA;VENTOLIN HFA) 108 (90 Base) MCG/ACT inhaler    Sig: Inhale 2 puffs into the lungs every 6 (six) hours as needed. For shortness of breath    Dispense:  1 Inhaler    Refill:  1    Pt needs to schedule physical  . Phentermine-Topiramate 3.75-23 MG CP24    Sig: One po daily x 2 weeks    Dispense:  14 capsule    Refill:  0    . Reviewed expectations re: course of current medical issues. . Discussed self-management of symptoms. . Outlined signs and symptoms indicating need for more acute intervention. . Patient verbalized understanding and all questions were answered. Marland Kitchen Health Maintenance issues including appropriate healthy diet, exercise, and smoking avoidance were discussed with patient. . See orders for this visit as documented in the electronic medical record. . Patient received an After Visit Summary.  CMA served as Education administrator during this visit. History, Physical, and Plan performed by medical provider. The above documentation has been reviewed and is accurate and complete. Briscoe Deutscher, D.O.  Briscoe Deutscher, DO [place], [facility] 01/05/2018

## 2018-01-19 ENCOUNTER — Ambulatory Visit: Payer: No Typology Code available for payment source | Admitting: Physical Therapy

## 2018-01-19 ENCOUNTER — Telehealth: Payer: Self-pay | Admitting: Family Medicine

## 2018-01-19 ENCOUNTER — Encounter: Payer: Self-pay | Admitting: Physical Therapy

## 2018-01-19 DIAGNOSIS — G8929 Other chronic pain: Secondary | ICD-10-CM

## 2018-01-19 DIAGNOSIS — M6281 Muscle weakness (generalized): Secondary | ICD-10-CM | POA: Diagnosis not present

## 2018-01-19 DIAGNOSIS — M545 Low back pain, unspecified: Secondary | ICD-10-CM

## 2018-01-19 MED FILL — QSYMIA 3.75 MG-23 MG CAP: 3.75-23 | 14 days supply | Qty: 14 | Fill #0

## 2018-01-19 MED FILL — predniSONE 5 MG TABS: 5 | 12 days supply | Qty: 42 | Fill #0

## 2018-01-19 MED FILL — VENTOLIN HFA 90 MCG INHALER: 108 (90 BAS | 25 days supply | Qty: 18 | Fill #0

## 2018-01-19 NOTE — Therapy (Signed)
[place], [place], [address] Phone: [phone]   Fax:  [phone]  Physical Therapy Evaluation  Patient Details  Name: Olivia [name] MRN: [id number] Date of Birth: [birth year] Referring Provider: Briscoe Deutscher   Encounter Date: 01/19/2018  PT End of Session - 01/19/18 0954    Visit Number  1    Number of Visits  16    Date for PT Re-Evaluation  03/16/18    Authorization Type  [facility]     PT Start Time  7782    PT Stop Time  0944    PT Time Calculation (min)  57 min    Activity Tolerance  Patient tolerated treatment well    Behavior During Therapy  [facility] for tasks assessed/performed       Past Medical History:  Diagnosis Date  . Asthma   . Contact lens/glasses   . Environmental allergies   . Hiatal hernia   . HLD (hyperlipidemia) 04/15/2008  . Microcytic anemia   . Migraine, hormonal   . PACs (premature atrial contraction), s/p Cardiology evaluation   . PCOS (polycystic ovarian syndrome) 10/25/2014  . Pneumonia 3/13  . PONV (postoperative nausea and vomiting)   . Reflux   . Seasonal allergies     Past Surgical History:  Procedure Laterality Date  . HIATAL HERNIA REPAIR  04/14/2012   Procedure: LAPAROSCOPIC REPAIR OF HIATAL HERNIA;  Surgeon: Pedro Earls, MD;  Location: [facility];  Service: General;  Laterality: N/A;  Hiatal Hernia Repair-Replacement of Lap Band  . LAPAROSCOPIC GASTRIC BANDING  08/08/2008  . TONSILLECTOMY  1981    There were no vitals filed for this visit.   Subjective Assessment - 01/19/18 0856    Subjective  Pt with main complaint of back pain, intermittent L LE pain/burning. She has mild pain on daily basis, but flare ups about 1x/mo that she would like to decrease. She states scoliosis since childhood, notices that height and changes are worsening. Has had back issues in the past, probably disc related from bending and lifting at work, which resolved on their own. She also  states L rib discplacement several times per year. States L LE pain, burning, intermittently. 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 Pt very hesitant for lifting arms above head, and reaching, due to pulling on L rib. She has gym membership, but has not been in months, due to decreased activity level, pain, and fear, would like to get back to exercising. She has appt scheduled to see spine specialist in April. Has had recent MRI, + findings for degeneration and compression.     Limitations  Lifting;Standing;Walking;House hold activities;Sitting    Diagnostic tests  Recent MRI    Patient Stated Goals  Increased activity level, ability for exercise, decreased pain.     Currently in Pain?  Yes    Pain Score  2     Pain Location  Back    Pain Orientation  Left    Pain Descriptors / Indicators  Aching;Spasm;Sharp    Pain Type  Chronic pain    Pain Onset  More than a month ago    Pain Frequency  Intermittent    Aggravating Factors   Laying flat on hard surface, jumping, hopping, bending, reaching up (due to rib) ,     Pain Relieving Factors  rest, ice    Effect of Pain on Daily Activities  has low daily  pain levels, but frequent flare ups, up to 7/10 for a week or so at a time.          [facility] PT Assessment - 01/19/18 0001      Assessment   Medical Diagnosis  Back Pain    Referring Provider  Briscoe Deutscher    Hand Dominance  Right    Next MD Visit  Spine specialist- April    Prior Therapy  None      Precautions   Precautions  None      Restrictions   Weight Bearing Restrictions  No      Balance Screen   Has the patient fallen in the past 6 months  No      Prior Function   Level of Independence  Independent      Cognition   Overall Cognitive Status  Within Functional Limits for tasks assessed      Posture/Postural Control   Posture Comments  L LE shorter, mild deficit in supine(likely due to scoliosis)      ROM / Strength   AROM /  PROM / Strength  AROM;Strength      AROM   Overall AROM Comments  Lumbar ROM: WNL, Mild limitation for R SB;   LE ROM: WNL, hypermobile      Strength   Overall Strength Comments  Core: 3/5;  Hips: 4-/5       Palpation   Palpation comment   Mild scoliosis, tight L Lumbar paraspinals, L  tightness and increased tissue density at L glute and glute med ;         Special Tests   Other special tests  Negative SLR and LLTT on L;              Objective measurements completed on examination: See above findings.      [facility] PT Treatment/Exercise - 01/19/18 0001      Exercises   Exercises  Lumbar      Lumbar Exercises: Stretches   Single Knee to Chest Stretch  3 reps;30 seconds    Figure 4 Stretch  3 reps;30 seconds    Figure 4 Stretch Limitations  supine: modified    Other Lumbar Stretch Exercise  Standing R SB for QL stretch(modified) 30 sec x3             PT Education - 01/19/18 0946    Education provided  Yes    Education Details  HEP, PT POC     Person(s) Educated  Patient    Methods  Explanation;Demonstration;Verbal cues    Comprehension  Need further instruction;Verbalized understanding       PT Short Term Goals - 01/19/18 0958      PT SHORT TERM GOAL #1   Title  Pt to demo independence with initial HEp     Time  3    Period  Weeks    Status  New    Target Date  02/09/18      PT SHORT TERM GOAL #2   Title  Pt to demo ability for R SB without fear    Time  3    Period  Weeks    Status  New    Target Date  02/09/18        PT Long Term Goals - 01/19/18 0959      PT LONG TERM GOAL #1   Title  Pt to demo ability for full, painfree lumbar sidebending and rotation    Time  8    Period  Weeks    Status  New    Target Date  03/16/18      PT LONG TERM GOAL #2   Title  Pt to demo increased core and hip strength to at least 4+/5 to improve stability and pain     Time  8    Period  Weeks    Status  New    Target Date  03/16/18      PT LONG  TERM GOAL #3   Title  Pt to demo independence with final HEP for long term strengthening and stabilization     Time  8    Period  Weeks    Status  New    Target Date  03/16/18      PT LONG TERM GOAL #4   Title  Pt to demo ability for proper squat mechanics and reaching mechanics, to improve ability and confidence with work duties     Time  8    Period  Weeks    Status  New    Target Date  03/16/18             Plan - 01/19/18 1002    Clinical Impression Statement  Pt presents with primary complaint of increased pain and frequent flare ups in low back, L LE, and L thoracic (rib) region. She has mild scoliosis, and mild postural dysfunction. She has tightness at L paraspinals, L QL, and L Glute compared to R side.  She has decreased lumbar ROM with side bending, due to tightness and fear of L Rib dysfunction and flare ups. She has hypermobility with most all other motions for back and hips. She has significant decrease in strength and stability in hips and core. She has lack of effective HEP and body mechanics, and will benefit from education on this to improve her instability and pain. Pt with decreased ability for full functional activities, reaching, lifting, bending, work duties, and IADLs, due to deficits. Pt to benefit from skilled PT to improve deficits, and to progress to ability for exercise and functional activity without pain.     Clinical Presentation  Stable    Clinical Decision Making  Low    Rehab Potential  Good    PT Frequency  2x / week    PT Duration  8 weeks    PT Treatment/Interventions  ADLs/Self Care Home Management;Cryotherapy;Electrical Stimulation;Moist Heat;Therapeutic activities;Iontophoresis 4mg /ml Dexamethasone;Functional mobility training;Stair training;Gait training;Ultrasound;Therapeutic exercise;Neuromuscular re-education;Patient/family education;Orthotic Fit/Training;Dry needling;Passive range of motion;Manual techniques;Taping;Vasopneumatic Device    PT  Next Visit Plan  Review HEP, add core and hip strength, Manual L paraspinal and L glut     Consulted and Agree with Plan of Care  Patient       Patient will benefit from skilled therapeutic intervention in order to improve the following deficits and impairments:  Abnormal gait, Decreased endurance, Decreased activity tolerance, Decreased strength, Pain, Decreased mobility, Decreased range of motion, Hypermobility, Improper body mechanics  Visit Diagnosis: Chronic bilateral low back pain without sciatica  Muscle weakness (generalized)     Problem List Patient Active Problem List   Diagnosis Date Noted  . Vitamin D deficiency, currently on 50K IU x 12 weeks 01/2018 01/05/2018  . Psoriasis, clobetasol, referred to Dermatology 01/05/2018  . Chronic low back pain with sciatica 01/05/2018  . Hisotry of microcytic anemia, high risk after bariatric surgery 12/07/2017  . IUD (intrauterine device) in place, Liletta 12/07/2017  . Tension headache 10/04/2016  . PCOS,  Metformin, IUD 10/25/2014  . Migraine with aura 01/11/2014  . Lapband APS resited with [facility] repair June 2013 05/08/2012  . HLD (hyperlipidemia), last checked 12/08/17, LDL 128, HDL 78 04/15/2008  . Morbid obesity ([place]) 04/15/2008  . Allergic rhinitis, Zyrtec and Singulair 04/15/2008  . Mild intermittent asthma, albuterol prn 04/15/2008   Lyndee Hensen, PT, DPT 4:22 PM  01/19/18    [place], [place], [address] Phone: [phone]   Fax:  [phone]  Name: Olivia [name] MRN: [id number] Date of Birth: 02/03/1966

## 2018-01-19 NOTE — Patient Instructions (Signed)
 *   Piriformis stretch and QL stretch modified *

## 2018-01-19 NOTE — Telephone Encounter (Signed)
See note

## 2018-01-19 NOTE — Telephone Encounter (Signed)
Copied from [place]. Topic: Quick Communication - See Telephone Encounter >> Jan 19, 2018 11:48 AM [place], Laban Emperor wrote: CRM for notification. See Telephone encounter for:  Pt states her insurance requires a PA on her Phentermine-Topiramate 3.75-23 [place] [[id number]].   01/19/18.

## 2018-01-21 ENCOUNTER — Ambulatory Visit: Payer: No Typology Code available for payment source | Admitting: Physical Therapy

## 2018-01-21 ENCOUNTER — Encounter: Payer: Self-pay | Admitting: Physical Therapy

## 2018-01-21 DIAGNOSIS — M545 Low back pain: Secondary | ICD-10-CM | POA: Diagnosis not present

## 2018-01-21 DIAGNOSIS — M6281 Muscle weakness (generalized): Secondary | ICD-10-CM | POA: Diagnosis not present

## 2018-01-21 DIAGNOSIS — G8929 Other chronic pain: Secondary | ICD-10-CM | POA: Diagnosis not present

## 2018-01-21 NOTE — Telephone Encounter (Signed)
Auth started on cover my meds Key [id number] Will have answer in 72 hrs

## 2018-01-21 NOTE — Patient Instructions (Signed)
TA contraction TA with march TA with Hip Abd YTB Bridging Pelvic tilts  All 2x10 , 2x/day

## 2018-01-21 NOTE — Therapy (Signed)
[facility] [address] [place], [place], [address] Phone: [phone]   Fax:  [phone]  Physical Therapy Treatment  Patient Details  Name: Olivia [name] MRN: [id number] Date of Birth: 05/13/1966 Referring Provider: Briscoe Deutscher   Encounter Date: 01/21/2018  PT End of Session - 01/21/18 0955    Visit Number  2    Number of Visits  16    Date for PT Re-Evaluation  03/16/18    Authorization Type  [facility]     PT Start Time  0846    PT Stop Time  0931    PT Time Calculation (min)  45 min    Activity Tolerance  Patient tolerated treatment well    Behavior During Therapy  [facility] for tasks assessed/performed       Past Medical History:  Diagnosis Date  . Asthma   . Contact lens/glasses   . Environmental allergies   . Hiatal hernia   . HLD (hyperlipidemia) 04/15/2008  . Microcytic anemia   . Migraine, hormonal   . PACs (premature atrial contraction), s/p Cardiology evaluation   . PCOS (polycystic ovarian syndrome) 10/25/2014  . Pneumonia 3/13  . PONV (postoperative nausea and vomiting)   . Reflux   . Seasonal allergies     Past Surgical History:  Procedure Laterality Date  . HIATAL HERNIA REPAIR  04/14/2012   Procedure: LAPAROSCOPIC REPAIR OF HIATAL HERNIA;  Surgeon: Pedro Earls, MD;  Location: [facility];  Service: General;  Laterality: N/A;  Hiatal Hernia Repair-Replacement of Lap Band  . LAPAROSCOPIC GASTRIC BANDING  08/08/2008  . TONSILLECTOMY  1981    There were no vitals filed for this visit.  Subjective Assessment - 01/21/18 0953    Subjective  Pt with no new complaints today. She has been doing HEP. She is going on Vacation next week, will return the following week.     Currently in Pain?  Yes    Pain Score  3     Pain Location  Back    Pain Orientation  Left    Pain Descriptors / Indicators  Aching;Sharp;Spasm    Pain Type  Chronic pain    Pain Onset  More than a month ago    Pain Frequency  Intermittent                       [facility] Adult PT Treatment/Exercise - 01/21/18 0001      Posture/Postural Control   Posture Comments  R hip higher in standing      Lumbar Exercises: Stretches   Active Hamstring Stretch  3 reps;30 seconds;Limitations    Active Hamstring Stretch Limitations  Seated    Single Knee to Chest Stretch  3 reps;30 seconds    Figure 4 Stretch  3 reps;30 seconds    Figure 4 Stretch Limitations  supine: modified    Other Lumbar Stretch Exercise  Standing Bil SB for QL stretch(modified) x10    Other Lumbar Stretch Exercise  L QL modified stretch x10      Lumbar Exercises: Supine   Ab Set  20 reps    Pelvic Tilt  20 reps    Glut Set  10 reps    Clam  20 reps;Limitations    Clam Limitations  alternating with YTB    Bent Knee Raise  20 reps    Bridge  20 reps      Lumbar Exercises: Sidelying   Hip Abduction  15 reps;Both  Manual Therapy   Manual Therapy  Soft tissue mobilization    Soft tissue mobilization  DTM to L glute             PT Education - 01/21/18 0955    Education provided  Yes    Education Details  HEP progression, Core strength    Person(s) Educated  Patient    Methods  Explanation;Demonstration;Verbal cues;Handout    Comprehension  Verbalized understanding;Need further instruction       PT Short Term Goals - 01/19/18 0958      PT SHORT TERM GOAL #1   Title  Pt to demo independence with initial HEp     Time  3    Period  Weeks    Status  New    Target Date  02/09/18      PT SHORT TERM GOAL #2   Title  Pt to demo ability for R SB without fear    Time  3    Period  Weeks    Status  New    Target Date  02/09/18        PT Long Term Goals - 01/19/18 0959      PT LONG TERM GOAL #1   Title  Pt to demo ability for full, painfree lumbar sidebending and rotation    Time  8    Period  Weeks    Status  New    Target Date  03/16/18      PT LONG TERM GOAL #2   Title  Pt to demo increased core and hip strength to at  least 4+/5 to improve stability and pain     Time  8    Period  Weeks    Status  New    Target Date  03/16/18      PT LONG TERM GOAL #3   Title  Pt to demo independence with final HEP for long term strengthening and stabilization     Time  8    Period  Weeks    Status  New    Target Date  03/16/18      PT LONG TERM GOAL #4   Title  Pt to demo ability for proper squat mechanics and reaching mechanics, to improve ability and confidence with work duties     Time  8    Period  Weeks    Status  New    Target Date  03/16/18            Plan - 01/21/18 0957    Clinical Impression Statement  Pt with soreness and increased tissue density in L glute,  DTM done for trigger point today. Pt educated on beginning stabilization exercises today, for core and hips, handout given. Pt challenged with stability due to weakness. Pt able to perform R SB, but very hesitant to do so, with increased tightness of L paraspinals.     Rehab Potential  Good    PT Frequency  2x / week    PT Duration  8 weeks    PT Treatment/Interventions  ADLs/Self Care Home Management;Cryotherapy;Electrical Stimulation;Moist Heat;Therapeutic activities;Iontophoresis 4mg /ml Dexamethasone;Functional mobility training;Stair training;Gait training;Ultrasound;Therapeutic exercise;Neuromuscular re-education;Patient/family education;Orthotic Fit/Training;Dry needling;Passive range of motion;Manual techniques;Taping;Vasopneumatic Device    PT Next Visit Plan  Review HEP, add core and hip strength, Manual L paraspinal and L glut     Consulted and Agree with Plan of Care  Patient       Patient will benefit from skilled therapeutic intervention in order  to improve the following deficits and impairments:  Abnormal gait, Decreased endurance, Decreased activity tolerance, Decreased strength, Pain, Decreased mobility, Decreased range of motion, Hypermobility, Improper body mechanics  Visit Diagnosis: Chronic bilateral low back pain  without sciatica  Muscle weakness (generalized)     Problem List Patient Active Problem List   Diagnosis Date Noted  . Vitamin D deficiency, currently on 50K IU x 12 weeks 01/2018 01/05/2018  . Psoriasis, clobetasol, referred to Dermatology 01/05/2018  . Chronic low back pain with sciatica 01/05/2018  . Hisotry of microcytic anemia, high risk after bariatric surgery 12/07/2017  . IUD (intrauterine device) in place, Liletta 12/07/2017  . Tension headache 10/04/2016  . PCOS, Metformin, IUD 10/25/2014  . Migraine with aura 01/11/2014  . Lapband APS resited with [facility] repair June 2013 05/08/2012  . HLD (hyperlipidemia), last checked 12/08/17, LDL 128, HDL 78 04/15/2008  . Morbid obesity ([place]) 04/15/2008  . Allergic rhinitis, Zyrtec and Singulair 04/15/2008  . Mild intermittent asthma, albuterol prn 04/15/2008   Lyndee Hensen, PT, DPT 9:59 AM  01/21/18    [facility] [place], [place], [address] Phone: [phone]   Fax:  [phone]  Name: Olivia [name] MRN: [id number] Date of Birth: [birth year]

## 2018-01-28 NOTE — Telephone Encounter (Signed)
Call received from cover my meds with additional questions. I have answered all information.

## 2018-02-02 ENCOUNTER — Ambulatory Visit: Payer: No Typology Code available for payment source | Admitting: Physical Therapy

## 2018-02-02 ENCOUNTER — Encounter: Payer: Self-pay | Admitting: Physical Therapy

## 2018-02-02 DIAGNOSIS — G8929 Other chronic pain: Secondary | ICD-10-CM | POA: Diagnosis not present

## 2018-02-02 DIAGNOSIS — M6281 Muscle weakness (generalized): Secondary | ICD-10-CM

## 2018-02-02 DIAGNOSIS — M545 Low back pain: Secondary | ICD-10-CM | POA: Diagnosis not present

## 2018-02-02 NOTE — Therapy (Signed)
[facility] [address] [place], [place], [address] Phone: [phone]   Fax:  [phone]  Physical Therapy Treatment  Patient Details  Name: Olivia [name] MRN: [id number] Date of Birth: April 04, 1966 Referring Provider: Briscoe Deutscher   Encounter Date: 02/02/2018  PT End of Session - 02/02/18 0922    Visit Number  3    Number of Visits  16    Date for PT Re-Evaluation  03/16/18    Authorization Type  [facility]     PT Start Time  0805    PT Stop Time  0900    PT Time Calculation (min)  55 min    Activity Tolerance  Patient tolerated treatment well    Behavior During Therapy  [facility] for tasks assessed/performed       Past Medical History:  Diagnosis Date  . Asthma   . Contact lens/glasses   . Environmental allergies   . Hiatal hernia   . HLD (hyperlipidemia) 04/15/2008  . Microcytic anemia   . Migraine, hormonal   . PACs (premature atrial contraction), s/p Cardiology evaluation   . PCOS (polycystic ovarian syndrome) 10/25/2014  . Pneumonia 3/13  . PONV (postoperative nausea and vomiting)   . Reflux   . Seasonal allergies     Past Surgical History:  Procedure Laterality Date  . HIATAL HERNIA REPAIR  04/14/2012   Procedure: LAPAROSCOPIC REPAIR OF HIATAL HERNIA;  Surgeon: Pedro Earls, MD;  Location: [facility];  Service: General;  Laterality: N/A;  Hiatal Hernia Repair-Replacement of Lap Band  . LAPAROSCOPIC GASTRIC BANDING  08/08/2008  . TONSILLECTOMY  1981    There were no vitals filed for this visit.  Subjective Assessment - 02/02/18 0921    Subjective  Pt was away at [facility] last week. She states soreness in whole body and in back with increased standing. She had mild soreness in glut after last session, but feels trigger point is significantly reduced. She has been doing HEp     Pain Score  3     Pain Location  Back    Pain Orientation  Left    Pain Descriptors / Indicators  Aching    Pain Type  Chronic pain    Pain  Onset  More than a month ago    Pain Frequency  Intermittent                No data recorded       [facility] Adult PT Treatment/Exercise - 02/02/18 0810      Posture/Postural Control   Posture Comments  --      Lumbar Exercises: Stretches   Active Hamstring Stretch  3 reps;30 seconds;Limitations    Active Hamstring Stretch Limitations  Seated    Single Knee to Chest Stretch  --    Figure 4 Stretch  3 reps;30 seconds    Figure 4 Stretch Limitations  seated    Other Lumbar Stretch Exercise  Standing Bil SB for QL stretch(modified) x10    Other Lumbar Stretch Exercise  --      Lumbar Exercises: Standing   Other Standing Lumbar Exercises  Hip Abd and Ext 2x10 each bil;       Lumbar Exercises: Supine   Ab Set  20 reps    Pelvic Tilt  20 reps    Glut Set  10 reps    Clam  20 reps;Limitations    Clam Limitations  alternating with YTB    Bent Knee Raise  20  reps    Dead Bug  20 reps    Bridge  20 reps      Lumbar Exercises: Sidelying   Hip Abduction  Both;20 reps      Manual Therapy   Manual Therapy  Soft tissue mobilization    Soft tissue mobilization  DTM to L glute  (manual and roller);  DTM to L paraspinal and QL              PT Education - 02/02/18 0922    Education provided  Yes    Education Details  HEP    Person(s) Educated  Patient    Methods  Explanation;Demonstration;Verbal cues    Comprehension  Verbalized understanding;Need further instruction       PT Short Term Goals - 01/19/18 0958      PT SHORT TERM GOAL #1   Title  Pt to demo independence with initial HEp     Time  3    Period  Weeks    Status  New    Target Date  02/09/18      PT SHORT TERM GOAL #2   Title  Pt to demo ability for R SB without fear    Time  3    Period  Weeks    Status  New    Target Date  02/09/18        PT Long Term Goals - 01/19/18 0959      PT LONG TERM GOAL #1   Title  Pt to demo ability for full, painfree lumbar sidebending and rotation    Time   8    Period  Weeks    Status  New    Target Date  03/16/18      PT LONG TERM GOAL #2   Title  Pt to demo increased core and hip strength to at least 4+/5 to improve stability and pain     Time  8    Period  Weeks    Status  New    Target Date  03/16/18      PT LONG TERM GOAL #3   Title  Pt to demo independence with final HEP for long term strengthening and stabilization     Time  8    Period  Weeks    Status  New    Target Date  03/16/18      PT LONG TERM GOAL #4   Title  Pt to demo ability for proper squat mechanics and reaching mechanics, to improve ability and confidence with work duties     Time  8    Period  Weeks    Status  New    Target Date  03/16/18            Plan - 02/02/18 3220    Clinical Impression Statement  Pt challenged with all strengthening exercises for core and hip that were done today, due to weakness and instability. Pt with soreness at L lower thoracic paraspinals, and at L Glut trigger point, addressed with manual DTM today. Pt very hesitant of most movement , due to fear of increased pain and will benefit from continued practice with movement mechanics.     Rehab Potential  Good    PT Frequency  2x / week    PT Duration  8 weeks    PT Treatment/Interventions  ADLs/Self Care Home Management;Cryotherapy;Electrical Stimulation;Moist Heat;Therapeutic activities;Iontophoresis 4mg /ml Dexamethasone;Functional mobility training;Stair training;Gait training;Ultrasound;Therapeutic exercise;Neuromuscular re-education;Patient/family education;Orthotic Fit/Training;Dry needling;Passive range of motion;Manual techniques;Taping;Vasopneumatic  Device    PT Next Visit Plan  Review HEP, add core and hip strength, Manual L paraspinal and L glut     Consulted and Agree with Plan of Care  Patient       Patient will benefit from skilled therapeutic intervention in order to improve the following deficits and impairments:  Abnormal gait, Decreased endurance, Decreased  activity tolerance, Decreased strength, Pain, Decreased mobility, Decreased range of motion, Hypermobility, Improper body mechanics  Visit Diagnosis: Chronic bilateral low back pain without sciatica  Muscle weakness (generalized)     Problem List Patient Active Problem List   Diagnosis Date Noted  . Vitamin D deficiency, currently on 50K IU x 12 weeks 01/2018 01/05/2018  . Psoriasis, clobetasol, referred to Dermatology 01/05/2018  . Chronic low back pain with sciatica 01/05/2018  . Hisotry of microcytic anemia, high risk after bariatric surgery 12/07/2017  . IUD (intrauterine device) in place, Liletta 12/07/2017  . Tension headache 10/04/2016  . PCOS, Metformin, IUD 10/25/2014  . Migraine with aura 01/11/2014  . Lapband APS resited with [facility] repair June 2013 05/08/2012  . HLD (hyperlipidemia), last checked 12/08/17, LDL 128, HDL 78 04/15/2008  . Morbid obesity ([place]) 04/15/2008  . Allergic rhinitis, Zyrtec and Singulair 04/15/2008  . Mild intermittent asthma, albuterol prn 04/15/2008   Lyndee Hensen, PT, DPT 9:30 AM  02/02/18    [facility] [place], [place], [address] Phone: [phone]   Fax:  [phone]  Name: Olivia [name] MRN: [id number] Date of Birth: 12-08-1965

## 2018-02-05 ENCOUNTER — Ambulatory Visit: Payer: No Typology Code available for payment source | Admitting: Physical Therapy

## 2018-02-05 ENCOUNTER — Encounter: Payer: Self-pay | Admitting: Physical Therapy

## 2018-02-05 DIAGNOSIS — M6281 Muscle weakness (generalized): Secondary | ICD-10-CM | POA: Diagnosis not present

## 2018-02-05 DIAGNOSIS — G8929 Other chronic pain: Secondary | ICD-10-CM

## 2018-02-05 DIAGNOSIS — M545 Low back pain, unspecified: Secondary | ICD-10-CM

## 2018-02-05 NOTE — Therapy (Signed)
[facility] [place], [place], [address] Phone: [phone]   Fax:  [phone]  Physical Therapy Treatment  Patient Details  Name: Olivia [name] MRN: [id number] Date of Birth: 20-Jun-1966 Referring Provider: Briscoe Deutscher   Encounter Date: 02/05/2018  PT End of Session - 02/05/18 1123    Visit Number  4    Number of Visits  16    Date for PT Re-Evaluation  03/16/18    Authorization Type  [facility]     PT Start Time  0846    PT Stop Time  0930    PT Time Calculation (min)  44 min    Activity Tolerance  Patient tolerated treatment well    Behavior During Therapy  [facility] for tasks assessed/performed       Past Medical History:  Diagnosis Date  . Asthma   . Contact lens/glasses   . Environmental allergies   . Hiatal hernia   . HLD (hyperlipidemia) 04/15/2008  . Microcytic anemia   . Migraine, hormonal   . PACs (premature atrial contraction), s/p Cardiology evaluation   . PCOS (polycystic ovarian syndrome) 10/25/2014  . Pneumonia 3/13  . PONV (postoperative nausea and vomiting)   . Reflux   . Seasonal allergies     Past Surgical History:  Procedure Laterality Date  . HIATAL HERNIA REPAIR  04/14/2012   Procedure: LAPAROSCOPIC REPAIR OF HIATAL HERNIA;  Surgeon: Pedro Earls, MD;  Location: [facility];  Service: General;  Laterality: N/A;  Hiatal Hernia Repair-Replacement of Lap Band  . LAPAROSCOPIC GASTRIC BANDING  08/08/2008  . TONSILLECTOMY  1981    There were no vitals filed for this visit.  Subjective Assessment - 02/05/18 1120    Subjective  Pt states compliance with HEP. She is pleased that she is able to perform thoracic spine rotation and SB with less pain and hesitation.     Currently in Pain?  Yes    Pain Score  3     Pain Location  Back    Pain Orientation  Left    Pain Descriptors / Indicators  Aching    Pain Type  Chronic pain    Pain Onset  More than a month ago    Pain Frequency  Intermittent                        [facility] Adult PT Treatment/Exercise - 02/05/18 7425      Ambulation/Gait   Gait Comments  35 ft x 4, SPC, with practice for shorter stride length on L, and heel to toe gait.       Lumbar Exercises: Stretches   Active Hamstring Stretch  3 reps;30 seconds;Limitations    Active Hamstring Stretch Limitations  Seated    Figure 4 Stretch  3 reps;30 seconds    Figure 4 Stretch Limitations  seated    Other Lumbar Stretch Exercise  Standing Bil SB for QL stretch(modified) x10      Lumbar Exercises: Standing   Other Standing Lumbar Exercises  Hip Abd and Ext 2x10 each bil;       Lumbar Exercises: Supine   Ab Set  20 reps    Pelvic Tilt  20 reps    Clam  20 reps;Limitations    Clam Limitations  alternating with GTB    Bent Knee Raise  20 reps    Dead Bug  20 reps    Bridge  10 reps    Straight  Leg Raise  Limitations    Straight Leg Raises Limitations  2x5 bil      Manual Therapy   Manual Therapy  Soft tissue mobilization    Soft tissue mobilization  DTM to L glute  (manual and roller);  DTM to L paraspinal and QL              PT Education - 02/05/18 1123    Education provided  Yes    Education Details  HEP    Person(s) Educated  Patient    Methods  Explanation    Comprehension  Verbalized understanding       PT Short Term Goals - 01/19/18 0958      PT SHORT TERM GOAL #1   Title  Pt to demo independence with initial HEp     Time  3    Period  Weeks    Status  New    Target Date  02/09/18      PT SHORT TERM GOAL #2   Title  Pt to demo ability for R SB without fear    Time  3    Period  Weeks    Status  New    Target Date  02/09/18        PT Long Term Goals - 01/19/18 0959      PT LONG TERM GOAL #1   Title  Pt to demo ability for full, painfree lumbar sidebending and rotation    Time  8    Period  Weeks    Status  New    Target Date  03/16/18      PT LONG TERM GOAL #2   Title  Pt to demo increased core and hip strength  to at least 4+/5 to improve stability and pain     Time  8    Period  Weeks    Status  New    Target Date  03/16/18      PT LONG TERM GOAL #3   Title  Pt to demo independence with final HEP for long term strengthening and stabilization     Time  8    Period  Weeks    Status  New    Target Date  03/16/18      PT LONG TERM GOAL #4   Title  Pt to demo ability for proper squat mechanics and reaching mechanics, to improve ability and confidence with work duties     Time  8    Period  Weeks    Status  New    Target Date  03/16/18            Plan - 02/05/18 1124    Clinical Impression Statement  Manual therapy/STM done for L thoracic paraspinals today, decreased muscle tension from previous sessions, mild soreness in lower rib with spring testing. Pt with improved ability for R SB and QL stretch today, with decreased hesitation and no pain. She was able to perform light progression of stabilization activities without difficulty today. Pt requires cuing for mechanics and stabilization with ther ex. Plan to progress as tolerated. Pt pleased with improved mobility in just a few visits     Rehab Potential  Good    PT Frequency  2x / week    PT Duration  8 weeks    PT Treatment/Interventions  ADLs/Self Care Home Management;Cryotherapy;Electrical Stimulation;Moist Heat;Therapeutic activities;Iontophoresis 4mg /ml Dexamethasone;Functional mobility training;Stair training;Gait training;Ultrasound;Therapeutic exercise;Neuromuscular re-education;Patient/family education;Orthotic Fit/Training;Dry needling;Passive range of motion;Manual techniques;Taping;Vasopneumatic Device  PT Next Visit Plan  Review HEP, add core and hip strength, Manual L paraspinal and L glut     Consulted and Agree with Plan of Care  Patient       Patient will benefit from skilled therapeutic intervention in order to improve the following deficits and impairments:  Abnormal gait, Decreased endurance, Decreased activity  tolerance, Decreased strength, Pain, Decreased mobility, Decreased range of motion, Hypermobility, Improper body mechanics  Visit Diagnosis: Chronic bilateral low back pain without sciatica  Muscle weakness (generalized)     Problem List Patient Active Problem List   Diagnosis Date Noted  . Vitamin D deficiency, currently on 50K IU x 12 weeks 01/2018 01/05/2018  . Psoriasis, clobetasol, referred to Dermatology 01/05/2018  . Chronic low back pain with sciatica 01/05/2018  . Hisotry of microcytic anemia, high risk after bariatric surgery 12/07/2017  . IUD (intrauterine device) in place, Liletta 12/07/2017  . Tension headache 10/04/2016  . PCOS, Metformin, IUD 10/25/2014  . Migraine with aura 01/11/2014  . Lapband APS resited with [facility] repair June 2013 05/08/2012  . HLD (hyperlipidemia), last checked 12/08/17, LDL 128, HDL 78 04/15/2008  . Morbid obesity ([place]) 04/15/2008  . Allergic rhinitis, Zyrtec and Singulair 04/15/2008  . Mild intermittent asthma, albuterol prn 04/15/2008   Lyndee Hensen, PT, DPT 11:27 AM  02/05/18    [facility] [place], [place], [address] Phone: [phone]   Fax:  [phone]  Name: Olivia [name] MRN: [id number] Date of Birth: 1966/07/12

## 2018-02-07 ENCOUNTER — Encounter: Payer: Self-pay | Admitting: Family Medicine

## 2018-02-07 ENCOUNTER — Ambulatory Visit (INDEPENDENT_AMBULATORY_CARE_PROVIDER_SITE_OTHER): Payer: No Typology Code available for payment source | Admitting: Family Medicine

## 2018-02-07 VITALS — BP 126/84 | HR 91 | Temp 98.1°F | Wt 307.0 lb

## 2018-02-07 DIAGNOSIS — J4521 Mild intermittent asthma with (acute) exacerbation: Secondary | ICD-10-CM

## 2018-02-07 MED ORDER — IPRATROPIUM-ALBUTEROL 0.5-2.5 (3) MG/3ML IN SOLN
3.0000 mL | Freq: Once | RESPIRATORY_TRACT | Status: AC
  Administered 2018-02-07: 3 mL via RESPIRATORY_TRACT

## 2018-02-07 MED ORDER — PREDNISONE 10 MG PO TABS: ORAL_TABLET | ORAL | 0 refills | Status: DC

## 2018-02-07 MED ORDER — BECLOMETHASONE DIPROP HFA 80 MCG/ACT IN AERB: 1.0000 | INHALATION_SPRAY | Freq: Two times a day (BID) | RESPIRATORY_TRACT | 1 refills | Status: DC

## 2018-02-07 NOTE — Patient Instructions (Signed)
Follow up as needed or as scheduled START the Prednisone as directed (take w/ food) ADD the Qvar until you are feeling better- 1 puff twice daily Drink plenty of fluids Call with any questions or concerns Hang in there!!!

## 2018-02-07 NOTE — Progress Notes (Signed)
   Subjective:    Patient ID: Olivia [name], female    DOB: 1966-09-15, 52 y.o.   MRN: [id number]  HPI Asthma- pt reports flu like sxs 3-4 weeks ago.  This week had a 'rebound sinus infxn'.  Pt typically uses Albuterol and Qvar twice daily w/ good control.  Yesterday pt required Albuterol 15-20x/days.  This AM, she reports 'it's getting harder'.  No fevers.  + cough- this is part of pt's asthma, bronchospastic.   Review of Systems For ROS see HPI     Objective:   Physical Exam  Constitutional: She is oriented to person, place, and time. She appears well-developed and well-nourished. No distress.  HENT:  Head: Normocephalic and atraumatic.  TMs normal bilaterally Mild nasal congestion Throat w/out erythema, edema, or exudate  Eyes: Pupils are equal, round, and reactive to light. Conjunctivae and EOM are normal.  Neck: Normal range of motion. Neck supple.  Cardiovascular: Normal rate, regular rhythm, normal heart sounds and intact distal pulses.  No murmur heard. Pulmonary/Chest: Effort normal. No respiratory distress. She has wheezes (diffuse inspiratory and expiratory wheezes- wheezing and air movement improved s/p neb).  + hacking cough  Lymphadenopathy:    She has no cervical adenopathy.  Neurological: She is alert and oriented to person, place, and time.  Vitals reviewed.         Assessment & Plan:  Asthma exacerbation- new to provider, recurrent problem for pt.  Wheezing, air movement, and cough improved s/p neb tx.  Start pred taper, Qvar, cough meds PRN.  Reviewed supportive care and red flags that should prompt return.  Pt expressed understanding and is in agreement w/ plan.

## 2018-02-10 ENCOUNTER — Ambulatory Visit (INDEPENDENT_AMBULATORY_CARE_PROVIDER_SITE_OTHER): Payer: No Typology Code available for payment source | Admitting: Family Medicine

## 2018-02-10 ENCOUNTER — Ambulatory Visit (INDEPENDENT_AMBULATORY_CARE_PROVIDER_SITE_OTHER): Payer: No Typology Code available for payment source

## 2018-02-10 ENCOUNTER — Encounter: Payer: Self-pay | Admitting: Family Medicine

## 2018-02-10 VITALS — BP 138/88 | HR 84 | Temp 98.6°F | Ht 69.5 in | Wt 307.2 lb

## 2018-02-10 DIAGNOSIS — J189 Pneumonia, unspecified organism: Secondary | ICD-10-CM

## 2018-02-10 DIAGNOSIS — R059 Cough, unspecified: Secondary | ICD-10-CM

## 2018-02-10 DIAGNOSIS — J181 Lobar pneumonia, unspecified organism: Secondary | ICD-10-CM | POA: Diagnosis not present

## 2018-02-10 DIAGNOSIS — R05 Cough: Secondary | ICD-10-CM

## 2018-02-10 DIAGNOSIS — J4520 Mild intermittent asthma, uncomplicated: Secondary | ICD-10-CM

## 2018-02-10 MED ORDER — DOXYCYCLINE HYCLATE 100 MG PO TABS
100.0000 mg | ORAL_TABLET | Freq: Two times a day (BID) | ORAL | 0 refills | Status: DC
Start: 2018-02-10 — End: 2018-04-07

## 2018-02-10 MED ORDER — ALBUTEROL SULFATE (2.5 MG/3ML) 0.083% IN NEBU
2.5000 mg | INHALATION_SOLUTION | Freq: Four times a day (QID) | RESPIRATORY_TRACT | 12 refills | Status: DC | PRN
Start: 2018-02-10 — End: 2022-11-22

## 2018-02-10 MED ORDER — LEVALBUTEROL HCL 1.25 MG/3ML IN NEBU
1.2500 mg | INHALATION_SOLUTION | Freq: Once | RESPIRATORY_TRACT | Status: AC
  Administered 2018-02-10: 1.25 mg via RESPIRATORY_TRACT

## 2018-02-10 MED ORDER — PREDNISONE 20 MG PO TABS: 20.0000 mg | ORAL_TABLET | Freq: Two times a day (BID) | ORAL | 0 refills | Status: AC

## 2018-02-10 MED ORDER — METHYLPREDNISOLONE ACETATE 80 MG/ML IJ SUSP
80.0000 mg | Freq: Once | INTRAMUSCULAR | Status: AC
  Administered 2018-02-10: 80 mg via INTRAMUSCULAR

## 2018-02-10 MED ORDER — GUAIFENESIN-CODEINE 100-10 MG/5ML PO SYRP: 10.0000 mL | ORAL_SOLUTION | Freq: Every evening | ORAL | 0 refills | Status: DC | PRN

## 2018-02-10 MED FILL — DOXYCYCLINE HYCLATE 100 MG: 100 | 10 days supply | Qty: 20 | Fill #0

## 2018-02-10 MED FILL — predniSONE 20 MG TABS: 20 | 5 days supply | Qty: 10 | Fill #0

## 2018-02-10 MED FILL — ALBUTEROL 0.083% INHAL SOLN: (2.5 MG/3ML | 7 days supply | Qty: 75 | Fill #0

## 2018-02-10 NOTE — Progress Notes (Signed)
Olivia [name] is a 52 y.o. female here for an acute visit.  History of Present Illness:   Lonell Grandchild, CMA acting as scribe for Dr. Briscoe Deutscher.   HPI: Cough and wheezing with history of asthma. Seen at Saturday given nebulizer and prednisone. Not feeling any better. Patient is taking all prescribed asthma medications with no help. She is using the albuterol inhaler every two hours. Last use about one hour ago. She is taking the Quvar 3-4 times a day.    PMHx, SurgHx, SocialHx, Medications, and Allergies were reviewed in the Visit Navigator and updated as appropriate.  Current Medications:   .  albuterol (PROVENTIL HFA;VENTOLIN HFA) 108 (90 Base) MCG/ACT inhaler, Inhale 2 puffs into the lungs every 6 (six) hours as needed. For shortness of breath, Disp: 1 Inhaler, Rfl: 1 .  beclomethasone (QVAR REDIHALER) 80 MCG/ACT inhaler, Inhale 1 puff into the lungs 2 (two) times daily., Disp: 1 Inhaler, Rfl: 1 .  cetirizine (ZYRTEC) 10 MG tablet, Take 10 mg by mouth at bedtime. , Disp: , Rfl:  .  Cholecalciferol (VITAMIN D) 2000 units CAPS, Take 1 capsule by mouth daily., Disp: , Rfl:  .  Cholecalciferol 50000 units TABS, 50,000 units PO qwk for 12 weeks., Disp: 12 tablet, Rfl: 0 .  Clobetasol Propionate 0.05 % lotion, APPLY LOTION TO AREAS TWICE A WEEK, Disp: , Rfl: 0 .  levonorgestrel (LILETTA, 52 MG,) 18.6 MCG/DAY IUD IUD, 1 each by Intrauterine route once., Disp: , Rfl:  .  metFORMIN (GLUCOPHAGE-XR) 500 MG 24 hr tablet, Take 2 tablets (1,000 mg total) by mouth at bedtime., Disp: 60 tablet, Rfl: 3 .  METRONIDAZOLE, TOPICAL, 0.75 % LOTN, , Disp: , Rfl: 6 .  montelukast (SINGULAIR) 10 MG tablet, TAKE 1 TABLET BY MOUTH AT BEDTIME., Disp: 90 tablet, Rfl: 1 .  Multiple Vitamin (MULITIVITAMIN WITH MINERALS) TABS, Take 1 tablet by mouth daily., Disp: , Rfl:  .  Phentermine-Topiramate 3.75-23 MG CP24, One po daily x 2 weeks, Disp: 14 capsule, Rfl: 0 .  predniSONE (DELTASONE) 10 MG tablet, 3  tabs x3 days and then 2 tabs x3 days and then 1 tab x3 days.  Take w/ food., Disp: 18 tablet, Rfl: 0 .  tiZANidine (ZANAFLEX) 4 MG tablet, Take 1 tablet (4 mg total) by mouth every 6 (six) hours as needed for muscle spasms., Disp: 40 tablet, Rfl: 2   Allergies  Allergen Reactions  . Cephalosporins Anaphylaxis  . Eggs Or Egg-Derived Products Hives and Other (See Comments)    Angioedema  . Penicillins Anaphylaxis  . Sulfa Drugs Cross Reactors     Liver failure  . Effexor [Venlafaxine] Other (See Comments)    Lightheadedness, dizzy, headache   Review of Systems:   Pertinent items are noted in the HPI. Otherwise, ROS is negative.  Vitals:   Vitals:   02/10/18 1029  BP: 138/88  Pulse: 84  Temp: 98.6 F (37 C)  TempSrc: Oral  SpO2: 95%  Weight: (!) 307 lb 3.2 oz (139.3 kg)  Height: 5' 9.5" (1.765 m)     Body mass index is 44.72 kg/m.  Physical Exam:   Physical Exam  Constitutional: She appears well-nourished.  HENT:  Head: Normocephalic and atraumatic.  Eyes: Pupils are equal, round, and reactive to light. EOM are normal.  Neck: Normal range of motion. Neck supple.  Cardiovascular: Normal rate, regular rhythm, normal heart sounds and intact distal pulses.  Pulmonary/Chest: Effort normal. She has wheezes. She has rhonchi in the right  middle field and the right lower field.  Abdominal: Soft.  Skin: Skin is warm.  Psychiatric: She has a normal mood and affect. Her behavior is normal.  Nursing note and vitals reviewed.  Results for orders placed or performed in visit on 12/08/17  CBC with Differential/Platelet  Result Value Ref Range   WBC 6.4 4.0 - 10.5 K/uL   RBC 5.10 3.87 - 5.11 Mil/uL   Hemoglobin 14.4 12.0 - 15.0 g/dL   HCT 42.7 36.0 - 46.0 %   MCV 83.7 78.0 - 100.0 fl   MCHC 33.8 30.0 - 36.0 g/dL   RDW 14.1 11.5 - 15.5 %   Platelets 273.0 150.0 - 400.0 K/uL   Neutrophils Relative % 60.8 43.0 - 77.0 %   Lymphocytes Relative 28.8 12.0 - 46.0 %   Monocytes  Relative 6.9 3.0 - 12.0 %   Eosinophils Relative 2.7 0.0 - 5.0 %   Basophils Relative 0.8 0.0 - 3.0 %   Neutro Abs 3.9 1.4 - 7.7 K/uL   Lymphs Abs 1.9 0.7 - 4.0 K/uL   Monocytes Absolute 0.4 0.1 - 1.0 K/uL   Eosinophils Absolute 0.2 0.0 - 0.7 K/uL   Basophils Absolute 0.1 0.0 - 0.1 K/uL  Comprehensive metabolic panel  Result Value Ref Range   Sodium 138 135 - 145 mEq/L   Potassium 4.1 3.5 - 5.1 mEq/L   Chloride 106 96 - 112 mEq/L   CO2 24 19 - 32 mEq/L   Glucose, Bld 98 70 - 99 mg/dL   BUN 12 6 - 23 mg/dL   Creatinine, Ser 0.68 0.40 - 1.20 mg/dL   Total Bilirubin 0.7 0.2 - 1.2 mg/dL   Alkaline Phosphatase 48 39 - 117 U/L   AST 14 0 - 37 U/L   ALT 11 0 - 35 U/L   Total Protein 6.8 6.0 - 8.3 g/dL   Albumin 3.9 3.5 - 5.2 g/dL   Calcium 9.9 8.4 - 10.5 mg/dL   GFR 97.44 >60.00 mL/min  Vitamin B12  Result Value Ref Range   Vitamin B-12 391 211 - 911 pg/mL  TSH  Result Value Ref Range   TSH 2.21 0.35 - 4.50 uIU/mL  Hemoglobin A1c  Result Value Ref Range   Hgb A1c MFr Bld 5.5 4.6 - 6.5 %  Lipid panel  Result Value Ref Range   Cholesterol 229 (H) 0 - 200 mg/dL   Triglycerides 112.0 0.0 - 149.0 mg/dL   HDL 78.10 >39.00 mg/dL   VLDL 22.4 0.0 - 40.0 mg/dL   LDL Cholesterol 128 (H) 0 - 99 mg/dL   Total CHOL/HDL Ratio 3    NonHDL 150.82   VITAMIN D 25 Hydroxy (Vit-D Deficiency, Fractures)  Result Value Ref Range   VITD 19.28 (L) 30.00 - 100.00 ng/mL  Iron, TIBC and Ferritin Panel  Result Value Ref Range   Iron 56 40 - 190 mcg/dL   TIBC 387 250 - 450 mcg/dL (calc)   %SAT 14 11 - 50 % (calc)   Ferritin 16 10 - 232 ng/mL  Thyroid peroxidase antibody  Result Value Ref Range   Thyroperoxidase Ab SerPl-aCnc 1 <9 IU/mL   Assessment and Plan:   1. Cough - DG Chest 2 View - read by me, RLL PNA  2. Community acquired pneumonia of right lower lobe of lung (HCC) - levalbuterol (XOPENEX) nebulizer solution 1.25 mg - doxycycline (VIBRA-TABS) 100 MG tablet; Take 1 tablet (100 mg  total) by mouth 2 (two) times daily.  Dispense: 20 tablet;  Refill: 0 - predniSONE (DELTASONE) 20 MG tablet; Take 1 tablet (20 mg total) by mouth 2 (two) times daily with a meal for 5 days.  Dispense: 10 tablet; Refill: 0 - guaiFENesin-codeine (CHERATUSSIN AC) 100-10 MG/5ML syrup; Take 10 mLs by mouth at bedtime as needed for cough or congestion.  Dispense: 120 mL; Refill: 0 - methylPREDNISolone acetate (DEPO-MEDROL) injection 80 mg  3. Mild intermittent asthma without complication - albuterol (PROVENTIL) (2.5 MG/3ML) 0.083% nebulizer solution; Take 3 mLs (2.5 mg total) by nebulization every 6 (six) hours as needed for wheezing or shortness of breath.  Dispense: 75 mL; Refill: 12 - DME Nebulizer machine   . Reviewed expectations re: course of current medical issues. . Discussed self-management of symptoms. . Outlined signs and symptoms indicating need for more acute intervention. . Patient verbalized understanding and all questions were answered. Marland Kitchen Health Maintenance issues including appropriate healthy diet, exercise, and smoking avoidance were discussed with patient. . See orders for this visit as documented in the electronic medical record. . Patient received an After Visit Summary.  CMA served as Education administrator during this visit. History, Physical, and Plan performed by medical provider. The above documentation has been reviewed and is accurate and complete. Briscoe Deutscher, D.O.  Briscoe Deutscher, DO [place], [facility] 02/10/2018

## 2018-02-18 ENCOUNTER — Ambulatory Visit: Payer: No Typology Code available for payment source | Admitting: Physical Therapy

## 2018-02-18 ENCOUNTER — Encounter: Payer: Self-pay | Admitting: Physical Therapy

## 2018-02-18 DIAGNOSIS — M545 Low back pain: Secondary | ICD-10-CM

## 2018-02-18 DIAGNOSIS — G8929 Other chronic pain: Secondary | ICD-10-CM | POA: Diagnosis not present

## 2018-02-18 DIAGNOSIS — M6281 Muscle weakness (generalized): Secondary | ICD-10-CM

## 2018-02-18 NOTE — Therapy (Signed)
[facility] [address] [place], [place], [address] Phone: [phone]   Fax:  [phone]  Physical Therapy Treatment  Patient Details  Name: Olivia [name] MRN: [id number] Date of Birth: 07-04-1966 Referring Provider: Briscoe Deutscher   Encounter Date: 02/18/2018  PT End of Session - 02/18/18 1342    Visit Number  5    Number of Visits  16    Date for PT Re-Evaluation  03/16/18    Authorization Type  [facility]     PT Start Time  1213    PT Stop Time  1300    PT Time Calculation (min)  47 min    Activity Tolerance  Patient tolerated treatment well    Behavior During Therapy  [facility] for tasks assessed/performed       Past Medical History:  Diagnosis Date  . Asthma   . Contact lens/glasses   . Environmental allergies   . Hiatal hernia   . HLD (hyperlipidemia) 04/15/2008  . Microcytic anemia   . Migraine, hormonal   . PACs (premature atrial contraction), s/p Cardiology evaluation   . PCOS (polycystic ovarian syndrome) 10/25/2014  . Pneumonia 3/13  . PONV (postoperative nausea and vomiting)   . Reflux   . Seasonal allergies     Past Surgical History:  Procedure Laterality Date  . HIATAL HERNIA REPAIR  04/14/2012   Procedure: LAPAROSCOPIC REPAIR OF HIATAL HERNIA;  Surgeon: Pedro Earls, MD;  Location: [facility];  Service: General;  Laterality: N/A;  Hiatal Hernia Repair-Replacement of Lap Band  . LAPAROSCOPIC GASTRIC BANDING  08/08/2008  . TONSILLECTOMY  1981    There were no vitals filed for this visit.  Subjective Assessment - 02/18/18 1337    Subjective  Pt has been sick with pneumonia. She has increased sorenss due to coughing. Has been doing HEP, but not as much as usual.     Currently in Pain?  Yes    Pain Score  4     Pain Location  Back    Pain Orientation  Right;Left;Lower    Pain Descriptors / Indicators  Aching                       [facility] Adult PT Treatment/Exercise - 02/18/18 1221      Ambulation/Gait   Gait Comments  --      Lumbar Exercises: Stretches   Active Hamstring Stretch  --    Active Hamstring Stretch Limitations  --    Single Knee to Chest Stretch  3 reps;30 seconds    Figure 4 Stretch  3 reps;30 seconds    Figure 4 Stretch Limitations  seated    Other Lumbar Stretch Exercise  Standing QL stretch 30 sec x3 Bil      Lumbar Exercises: Aerobic   Stationary Bike  L1 x 8 min      Lumbar Exercises: Standing   Other Standing Lumbar Exercises  Hip Abd and Ext 2x10 each bil;       Lumbar Exercises: Supine   Ab Set  20 reps    Pelvic Tilt  20 reps    Clam  20 reps;Limitations    Clam Limitations  alternating with GTB    Bent Knee Raise  20 reps    Dead Bug  20 reps    Bridge  --    Straight Leg Raise  Limitations    Straight Leg Raises Limitations  2x5 bil      Manual  Therapy   Manual Therapy  Soft tissue mobilization    Soft tissue mobilization  DTM to L glute B lumbar paraspinal and QL              PT Education - 02/18/18 1341    Education provided  Yes    Education Details  HEP    Person(s) Educated  Patient    Methods  Explanation    Comprehension  Verbalized understanding;Need further instruction       PT Short Term Goals - 01/19/18 0958      PT SHORT TERM GOAL #1   Title  Pt to demo independence with initial HEp     Time  3    Period  Weeks    Status  New    Target Date  02/09/18      PT SHORT TERM GOAL #2   Title  Pt to demo ability for R SB without fear    Time  3    Period  Weeks    Status  New    Target Date  02/09/18        PT Long Term Goals - 01/19/18 0959      PT LONG TERM GOAL #1   Title  Pt to demo ability for full, painfree lumbar sidebending and rotation    Time  8    Period  Weeks    Status  New    Target Date  03/16/18      PT LONG TERM GOAL #2   Title  Pt to demo increased core and hip strength to at least 4+/5 to improve stability and pain     Time  8    Period  Weeks    Status  New    Target  Date  03/16/18      PT LONG TERM GOAL #3   Title  Pt to demo independence with final HEP for long term strengthening and stabilization     Time  8    Period  Weeks    Status  New    Target Date  03/16/18      PT LONG TERM GOAL #4   Title  Pt to demo ability for proper squat mechanics and reaching mechanics, to improve ability and confidence with work duties     Time  8    Period  Weeks    Status  New    Target Date  03/16/18            Plan - 02/18/18 1343    Clinical Impression Statement  Pt with tightness and soreness at Bil lumbar paraspinals, addressed with STM/DTM today. She has been able to progress UE motion, but is very hesitant for this. She does have increased pain in back (L) , with L hip abd and ext today. Plan to progress strength as tolerated.     Rehab Potential  Good    PT Frequency  2x / week    PT Duration  8 weeks    PT Treatment/Interventions  ADLs/Self Care Home Management;Cryotherapy;Electrical Stimulation;Moist Heat;Therapeutic activities;Iontophoresis 4mg /ml Dexamethasone;Functional mobility training;Stair training;Gait training;Ultrasound;Therapeutic exercise;Neuromuscular re-education;Patient/family education;Orthotic Fit/Training;Dry needling;Passive range of motion;Manual techniques;Taping;Vasopneumatic Device    PT Next Visit Plan  Review HEP, add core and hip strength, Manual L paraspinal and L glut     Consulted and Agree with Plan of Care  Patient       Patient will benefit from skilled therapeutic intervention in order to improve the following deficits  and impairments:  Abnormal gait, Decreased endurance, Decreased activity tolerance, Decreased strength, Pain, Decreased mobility, Decreased range of motion, Hypermobility, Improper body mechanics  Visit Diagnosis: Chronic bilateral low back pain without sciatica  Muscle weakness (generalized)     Problem List Patient Active Problem List   Diagnosis Date Noted  . Vitamin D deficiency,  currently on 50K IU x 12 weeks 01/2018 01/05/2018  . Psoriasis, clobetasol, referred to Dermatology 01/05/2018  . Chronic low back pain with sciatica 01/05/2018  . Hisotry of microcytic anemia, high risk after bariatric surgery 12/07/2017  . IUD (intrauterine device) in place, Liletta 12/07/2017  . Tension headache 10/04/2016  . PCOS, Metformin, IUD 10/25/2014  . Migraine with aura 01/11/2014  . Lapband APS resited with [facility] repair June 2013 05/08/2012  . HLD (hyperlipidemia), last checked 12/08/17, LDL 128, HDL 78 04/15/2008  . Morbid obesity ([place]) 04/15/2008  . Allergic rhinitis, Zyrtec and Singulair 04/15/2008  . Mild intermittent asthma, albuterol prn 04/15/2008   Lyndee Hensen, PT, DPT 1:45 PM  02/18/18    [facility] [place], [place], [address] Phone: [phone]   Fax:  [phone]  Name: Olivia [name] MRN: [id number] Date of Birth: 09-14-1966

## 2018-02-19 ENCOUNTER — Ambulatory Visit: Payer: No Typology Code available for payment source | Admitting: Physical Therapy

## 2018-02-19 ENCOUNTER — Encounter: Payer: Self-pay | Admitting: Physical Therapy

## 2018-02-19 DIAGNOSIS — M6281 Muscle weakness (generalized): Secondary | ICD-10-CM | POA: Diagnosis not present

## 2018-02-19 DIAGNOSIS — M545 Low back pain: Secondary | ICD-10-CM

## 2018-02-19 DIAGNOSIS — G8929 Other chronic pain: Secondary | ICD-10-CM | POA: Diagnosis not present

## 2018-02-19 NOTE — Therapy (Signed)
[facility] [place], [place], [address] Phone: [phone]   Fax:  [phone]  Physical Therapy Treatment  Patient Details  Name: Olivia [name] MRN: [id number] Date of Birth: [birth year] Referring Provider: Briscoe Deutscher   Encounter Date: 02/19/2018  PT End of Session - 02/19/18 0811    Visit Number  6    Number of Visits  16    Date for PT Re-Evaluation  03/16/18    Authorization Type  [facility]     PT Start Time  0802    PT Stop Time  0843    PT Time Calculation (min)  41 min    Activity Tolerance  Patient tolerated treatment well    Behavior During Therapy  [facility] for tasks assessed/performed       Past Medical History:  Diagnosis Date  . Asthma   . Contact lens/glasses   . Environmental allergies   . Hiatal hernia   . HLD (hyperlipidemia) 04/15/2008  . Microcytic anemia   . Migraine, hormonal   . PACs (premature atrial contraction), s/p Cardiology evaluation   . PCOS (polycystic ovarian syndrome) 10/25/2014  . Pneumonia 3/13  . PONV (postoperative nausea and vomiting)   . Reflux   . Seasonal allergies     Past Surgical History:  Procedure Laterality Date  . HIATAL HERNIA REPAIR  04/14/2012   Procedure: LAPAROSCOPIC REPAIR OF HIATAL HERNIA;  Surgeon: Pedro Earls, MD;  Location: [facility];  Service: General;  Laterality: N/A;  Hiatal Hernia Repair-Replacement of Lap Band  . LAPAROSCOPIC GASTRIC BANDING  08/08/2008  . TONSILLECTOMY  1981    There were no vitals filed for this visit.  Subjective Assessment - 02/19/18 0809    Subjective  Pt states decreased pain and tightness in back since yesterday.     Limitations  Lifting;Standing;Walking;House hold activities;Sitting    Currently in Pain?  Yes    Pain Score  2     Pain Location  Back    Pain Orientation  Right;Lower    Pain Descriptors / Indicators  Aching    Pain Type  Chronic pain    Pain Onset  More than a month ago    Pain Frequency   Intermittent                       [facility] Adult PT Treatment/Exercise - 02/19/18 0826      Lumbar Exercises: Stretches   Other Lumbar Stretch Exercise  --      Lumbar Exercises: Aerobic   Stationary Bike  L1 x 8 min      Lumbar Exercises: Standing   Other Standing Lumbar Exercises  Hip Abd and Ext 2x10 each bil;  Rows and Low Rows GTB x20 each with TA    Other Standing Lumbar Exercises  Standing posture at wall, TA, with Bil UE flexion x20;       Lumbar Exercises: Supine   Ab Set  20 reps    Clam  20 reps;Limitations    Clam Limitations  alternating with GTB    Bent Knee Raise  --    Dead Bug  20 reps    Straight Leg Raise  Limitations    Straight Leg Raises Limitations  2x 10 bil      Lumbar Exercises: Sidelying   Hip Abduction  Both;20 reps      Manual Therapy   Manual Therapy  Soft tissue mobilization  PT Education - 02/19/18 [phone]    Education provided  Yes    Education Details  HEP,     Person(s) Educated  Patient    Methods  Explanation    Comprehension  Verbalized understanding       PT Short Term Goals - 01/19/18 0958      PT SHORT TERM GOAL #1   Title  Pt to demo independence with initial HEp     Time  3    Period  Weeks    Status  New    Target Date  02/09/18      PT SHORT TERM GOAL #2   Title  Pt to demo ability for R SB without fear    Time  3    Period  Weeks    Status  New    Target Date  02/09/18        PT Long Term Goals - 01/19/18 0959      PT LONG TERM GOAL #1   Title  Pt to demo ability for full, painfree lumbar sidebending and rotation    Time  8    Period  Weeks    Status  New    Target Date  03/16/18      PT LONG TERM GOAL #2   Title  Pt to demo increased core and hip strength to at least 4+/5 to improve stability and pain     Time  8    Period  Weeks    Status  New    Target Date  03/16/18      PT LONG TERM GOAL #3   Title  Pt to demo independence with final HEP for long term  strengthening and stabilization     Time  8    Period  Weeks    Status  New    Target Date  03/16/18      PT LONG TERM GOAL #4   Title  Pt to demo ability for proper squat mechanics and reaching mechanics, to improve ability and confidence with work duties     Time  8    Period  Weeks    Status  New    Target Date  03/16/18            Plan - 02/19/18 0846    Clinical Impression Statement  Pt with improved ability for TA with other movements today, but sill requires cuing for activation. She has improved ability for UE flexion, no pain in rib or mid back with this today. She also has less pain with standing hip strength today. She has much difficulty standing on L LE (SLS) due to weakness. Pt showing progress and will benefit from continued care.     Rehab Potential  Good    PT Frequency  2x / week    PT Duration  8 weeks    PT Treatment/Interventions  ADLs/Self Care Home Management;Cryotherapy;Electrical Stimulation;Moist Heat;Therapeutic activities;Iontophoresis 4mg /ml Dexamethasone;Functional mobility training;Stair training;Gait training;Ultrasound;Therapeutic exercise;Neuromuscular re-education;Patient/family education;Orthotic Fit/Training;Dry needling;Passive range of motion;Manual techniques;Taping;Vasopneumatic Device    PT Next Visit Plan  Review HEP, add core and hip strength, Manual L paraspinal and L glut     Consulted and Agree with Plan of Care  Patient       Patient will benefit from skilled therapeutic intervention in order to improve the following deficits and impairments:  Abnormal gait, Decreased endurance, Decreased activity tolerance, Decreased strength, Pain, Decreased mobility, Decreased range of motion, Hypermobility, Improper body mechanics  Visit Diagnosis: Chronic bilateral low back pain without sciatica  Muscle weakness (generalized)     Problem List Patient Active Problem List   Diagnosis Date Noted  . Vitamin D deficiency, currently on 50K IU  x 12 weeks 01/2018 01/05/2018  . Psoriasis, clobetasol, referred to Dermatology 01/05/2018  . Chronic low back pain with sciatica 01/05/2018  . Hisotry of microcytic anemia, high risk after bariatric surgery 12/07/2017  . IUD (intrauterine device) in place, Liletta 12/07/2017  . Tension headache 10/04/2016  . PCOS, Metformin, IUD 10/25/2014  . Migraine with aura 01/11/2014  . Lapband APS resited with [facility] repair June 2013 05/08/2012  . HLD (hyperlipidemia), last checked 12/08/17, LDL 128, HDL 78 04/15/2008  . Morbid obesity ([place]) 04/15/2008  . Allergic rhinitis, Zyrtec and Singulair 04/15/2008  . Mild intermittent asthma, albuterol prn 04/15/2008   Lyndee Hensen, PT, DPT 8:48 AM  02/19/18    [facility] [place], [place], [address] Phone: [phone]   Fax:  [phone]  Name: PHILA SHOAF MRN: [id number] Date of Birth: February 12, 1966

## 2018-02-23 ENCOUNTER — Ambulatory Visit: Payer: No Typology Code available for payment source | Admitting: Physical Therapy

## 2018-02-23 ENCOUNTER — Encounter: Payer: Self-pay | Admitting: Physical Therapy

## 2018-02-23 DIAGNOSIS — M545 Low back pain, unspecified: Secondary | ICD-10-CM

## 2018-02-23 DIAGNOSIS — G8929 Other chronic pain: Secondary | ICD-10-CM | POA: Diagnosis not present

## 2018-02-23 DIAGNOSIS — M6281 Muscle weakness (generalized): Secondary | ICD-10-CM | POA: Diagnosis not present

## 2018-02-23 NOTE — Therapy (Signed)
[place], [place], [address] Phone: [phone]   Fax:  [phone]  Physical Therapy Treatment  Patient Details  Name: Olivia [name] MRN: [id number] Date of Birth: February 29, 1966 Referring Provider: Briscoe Deutscher   Encounter Date: 02/23/2018  PT End of Session - 02/23/18 0918    Visit Number  7    Number of Visits  16    Date for PT Re-Evaluation  03/16/18    Authorization Type  [facility]     PT Start Time  0804    PT Stop Time  0850    PT Time Calculation (min)  46 min    Activity Tolerance  Patient tolerated treatment well    Behavior During Therapy  [facility] for tasks assessed/performed       Past Medical History:  Diagnosis Date  . Asthma   . Contact lens/glasses   . Environmental allergies   . Hiatal hernia   . HLD (hyperlipidemia) 04/15/2008  . Microcytic anemia   . Migraine, hormonal   . PACs (premature atrial contraction), s/p Cardiology evaluation   . PCOS (polycystic ovarian syndrome) 10/25/2014  . Pneumonia 3/13  . PONV (postoperative nausea and vomiting)   . Reflux   . Seasonal allergies     Past Surgical History:  Procedure Laterality Date  . HIATAL HERNIA REPAIR  04/14/2012   Procedure: LAPAROSCOPIC REPAIR OF HIATAL HERNIA;  Surgeon: Pedro Earls, MD;  Location: [facility];  Service: General;  Laterality: N/A;  Hiatal Hernia Repair-Replacement of Lap Band  . LAPAROSCOPIC GASTRIC BANDING  08/08/2008  . TONSILLECTOMY  1981    There were no vitals filed for this visit.  Subjective Assessment - 02/23/18 0917    Subjective  Pt did yard work over the weekend, and had increased pain yesterday and today from this. She states tightness in low back, bilaterally.     Currently in Pain?  Yes    Pain Score  3     Pain Location  Back    Pain Orientation  Left;Right    Pain Descriptors / Indicators  Tightness    Pain Type  Chronic pain    Pain Onset  More than a month ago    Pain Frequency   Intermittent                       [facility] Adult PT Treatment/Exercise - 02/23/18 0811      Lumbar Exercises: Stretches   Single Knee to Chest Stretch  2 reps;30 seconds    Double Knee to Chest Stretch  2 reps;30 seconds    Pelvic Tilt  10 reps    Other Lumbar Stretch Exercise  Standing QL stretch 30 sec x3 Bil    Other Lumbar Stretch Exercise  Childs pose 30 sec x3 center      Lumbar Exercises: Aerobic   Stationary Bike  L1 x 8 min      Lumbar Exercises: Standing   Other Standing Lumbar Exercises  --    Other Standing Lumbar Exercises  --      Lumbar Exercises: Supine   Ab Set  20 reps    Clam  20 reps;Limitations    Clam Limitations  alternating with GTB    Straight Leg Raise  Limitations    Straight Leg Raises Limitations  2x 10 bil      Manual Therapy   Manual Therapy  Soft tissue mobilization    Soft tissue mobilization  DTM to B lumbar paraspinal and QL              PT Education - 02/23/18 0918    Education provided  Yes    Education Details  HEP, Posture of lumbar spine in standing    Person(s) Educated  Patient    Methods  Explanation    Comprehension  Verbalized understanding       PT Short Term Goals - 01/19/18 0958      PT SHORT TERM GOAL #1   Title  Pt to demo independence with initial HEp     Time  3    Period  Weeks    Status  New    Target Date  02/09/18      PT SHORT TERM GOAL #2   Title  Pt to demo ability for R SB without fear    Time  3    Period  Weeks    Status  New    Target Date  02/09/18        PT Long Term Goals - 01/19/18 0959      PT LONG TERM GOAL #1   Title  Pt to demo ability for full, painfree lumbar sidebending and rotation    Time  8    Period  Weeks    Status  New    Target Date  03/16/18      PT LONG TERM GOAL #2   Title  Pt to demo increased core and hip strength to at least 4+/5 to improve stability and pain     Time  8    Period  Weeks    Status  New    Target Date  03/16/18      PT  LONG TERM GOAL #3   Title  Pt to demo independence with final HEP for long term strengthening and stabilization     Time  8    Period  Weeks    Status  New    Target Date  03/16/18      PT LONG TERM GOAL #4   Title  Pt to demo ability for proper squat mechanics and reaching mechanics, to improve ability and confidence with work duties     Time  8    Period  Weeks    Status  New    Target Date  03/16/18            Plan - 02/23/18 0919    Clinical Impression Statement  Pt with soreness and tightness at Bil lumbar paraspinals and QL, addressed with DTM today, increased tightness on L vs R. Pt has difficulty stretching low lumbar region, due to significant lumbar lordosis, discussed childs pose for stretching today, and discussed standing neutral spine posture, as well as stretching more throughout the day to prevent tightness. Pt to benefit from continued strengthening for core and hips.     Rehab Potential  Good    PT Frequency  2x / week    PT Duration  8 weeks    PT Treatment/Interventions  ADLs/Self Care Home Management;Cryotherapy;Electrical Stimulation;Moist Heat;Therapeutic activities;Iontophoresis 4mg /ml Dexamethasone;Functional mobility training;Stair training;Gait training;Ultrasound;Therapeutic exercise;Neuromuscular re-education;Patient/family education;Orthotic Fit/Training;Dry needling;Passive range of motion;Manual techniques;Taping;Vasopneumatic Device    PT Next Visit Plan  Review HEP, add core and hip strength, Manual L paraspinal and L glut     Consulted and Agree with Plan of Care  Patient       Patient will benefit from skilled therapeutic intervention in order to improve  the following deficits and impairments:  Abnormal gait, Decreased endurance, Decreased activity tolerance, Decreased strength, Pain, Decreased mobility, Decreased range of motion, Hypermobility, Improper body mechanics  Visit Diagnosis: Chronic bilateral low back pain without sciatica  Muscle  weakness (generalized)     Problem List Patient Active Problem List   Diagnosis Date Noted  . Vitamin D deficiency, currently on 50K IU x 12 weeks 01/2018 01/05/2018  . Psoriasis, clobetasol, referred to Dermatology 01/05/2018  . Chronic low back pain with sciatica 01/05/2018  . Hisotry of microcytic anemia, high risk after bariatric surgery 12/07/2017  . IUD (intrauterine device) in place, Liletta 12/07/2017  . Tension headache 10/04/2016  . PCOS, Metformin, IUD 10/25/2014  . Migraine with aura 01/11/2014  . Lapband APS resited with [facility] repair June 2013 05/08/2012  . HLD (hyperlipidemia), last checked 12/08/17, LDL 128, HDL 78 04/15/2008  . Morbid obesity ([place]) 04/15/2008  . Allergic rhinitis, Zyrtec and Singulair 04/15/2008  . Mild intermittent asthma, albuterol prn 04/15/2008   Lyndee Hensen, PT, DPT 9:21 AM  02/23/18    [facility] [place], [place], [address] Phone: [phone]   Fax:  [phone]  Name: Olivia [name] MRN: [id number] Date of Birth: 18-Nov-1965

## 2018-02-26 ENCOUNTER — Ambulatory Visit: Payer: No Typology Code available for payment source | Admitting: Physical Therapy

## 2018-02-26 ENCOUNTER — Encounter: Payer: Self-pay | Admitting: Physical Therapy

## 2018-02-26 DIAGNOSIS — M545 Low back pain: Secondary | ICD-10-CM | POA: Diagnosis not present

## 2018-02-26 DIAGNOSIS — G8929 Other chronic pain: Secondary | ICD-10-CM

## 2018-02-26 DIAGNOSIS — M6281 Muscle weakness (generalized): Secondary | ICD-10-CM

## 2018-02-26 NOTE — Therapy (Signed)
[place], [place], [address] Phone: [phone]   Fax:  [phone]  Physical Therapy Treatment  Patient Details  Name: Olivia [name] MRN: [id number] Date of Birth: 01/13/1966 Referring Provider: Briscoe Deutscher   Encounter Date: 02/26/2018  PT End of Session - 02/26/18 0855    Visit Number  8    Number of Visits  16    Date for PT Re-Evaluation  03/16/18    Authorization Type  [facility]     PT Start Time  0804    PT Stop Time  0846    PT Time Calculation (min)  42 min    Activity Tolerance  Patient tolerated treatment well    Behavior During Therapy  [facility] for tasks assessed/performed       Past Medical History:  Diagnosis Date  . Asthma   . Contact lens/glasses   . Environmental allergies   . Hiatal hernia   . HLD (hyperlipidemia) 04/15/2008  . Microcytic anemia   . Migraine, hormonal   . PACs (premature atrial contraction), s/p Cardiology evaluation   . PCOS (polycystic ovarian syndrome) 10/25/2014  . Pneumonia 3/13  . PONV (postoperative nausea and vomiting)   . Reflux   . Seasonal allergies     Past Surgical History:  Procedure Laterality Date  . HIATAL HERNIA REPAIR  04/14/2012   Procedure: LAPAROSCOPIC REPAIR OF HIATAL HERNIA;  Surgeon: Pedro Earls, MD;  Location: [facility];  Service: General;  Laterality: N/A;  Hiatal Hernia Repair-Replacement of Lap Band  . LAPAROSCOPIC GASTRIC BANDING  08/08/2008  . TONSILLECTOMY  1981    There were no vitals filed for this visit.  Subjective Assessment - 02/26/18 0812    Subjective  Pt states that she was moslty pain free for a few days. 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     Currently in Pain?  Yes    Pain Score  2     Pain Location  Back    Pain Orientation  Right;Left    Pain Descriptors / Indicators  Tightness    Pain Onset  More than a month ago    Pain Frequency  Intermittent                       [facility]  Adult PT Treatment/Exercise - 02/26/18 0816      Lumbar Exercises: Stretches   Active Hamstring Stretch  3 reps;30 seconds;Limitations    Single Knee to Chest Stretch  2 reps;30 seconds    Double Knee to Chest Stretch  2 reps;30 seconds    Pelvic Tilt  10 reps    Other Lumbar Stretch Exercise  Standing QL stretch 30 sec x3 Bil      Lumbar Exercises: Aerobic   Stationary Bike  L1 x 8 min      Lumbar Exercises: Standing   Other Standing Lumbar Exercises  Hip Abd 2x10 each bil;  Rows  GTB x20 each with TA: UE flex and ext with RTB and TA x15 each     Other Standing Lumbar Exercises  Squats (wiht education on form) x15       Lumbar Exercises: Seated   Other Seated Lumbar Exercises  Lat pull down GTB x20;       Lumbar Exercises: Supine   Ab Set  20 reps    Clam  20 reps;Limitations    Clam Limitations  alternating with GTB    Dead  Bug  --    Straight Leg Raise  Limitations    Straight Leg Raises Limitations  2x 10 bil      Lumbar Exercises: Sidelying   Hip Abduction  Both;20 reps      Manual Therapy   Manual Therapy  Soft tissue mobilization    Soft tissue mobilization  DTM to B lumbar paraspinal and QL              PT Education - 02/26/18 0854    Education provided  Yes    Education Details  HEP, gym recommendations     Person(s) Educated  Patient    Methods  Explanation    Comprehension  Verbalized understanding       PT Short Term Goals - 01/19/18 0958      PT SHORT TERM GOAL #1   Title  Pt to demo independence with initial HEp     Time  3    Period  Weeks    Status  New    Target Date  02/09/18      PT SHORT TERM GOAL #2   Title  Pt to demo ability for R SB without fear    Time  3    Period  Weeks    Status  New    Target Date  02/09/18        PT Long Term Goals - 01/19/18 0959      PT LONG TERM GOAL #1   Title  Pt to demo ability for full, painfree lumbar sidebending and rotation    Time  8    Period  Weeks    Status  New    Target Date   03/16/18      PT LONG TERM GOAL #2   Title  Pt to demo increased core and hip strength to at least 4+/5 to improve stability and pain     Time  8    Period  Weeks    Status  New    Target Date  03/16/18      PT LONG TERM GOAL #3   Title  Pt to demo independence with final HEP for long term strengthening and stabilization     Time  8    Period  Weeks    Status  New    Target Date  03/16/18      PT LONG TERM GOAL #4   Title  Pt to demo ability for proper squat mechanics and reaching mechanics, to improve ability and confidence with work duties     Time  8    Period  Weeks    Status  New    Target Date  03/16/18            Plan - 02/26/18 0857    Clinical Impression Statement  Pt able to progress core and hip strengthening today, without any increased pain. Discussed activities to avoid at gym, pt is going to start working with Physiological scientist. She demonstrates significant weakness and instability in core, and will benefit from continued care for this.     Rehab Potential  Good    PT Frequency  2x / week    PT Duration  8 weeks    PT Treatment/Interventions  ADLs/Self Care Home Management;Cryotherapy;Electrical Stimulation;Moist Heat;Therapeutic activities;Iontophoresis 4mg /ml Dexamethasone;Functional mobility training;Stair training;Gait training;Ultrasound;Therapeutic exercise;Neuromuscular re-education;Patient/family education;Orthotic Fit/Training;Dry needling;Passive range of motion;Manual techniques;Taping;Vasopneumatic Device    PT Next Visit Plan  Review HEP, add core and hip strength, Manual L  paraspinal and L glut     Consulted and Agree with Plan of Care  Patient       Patient will benefit from skilled therapeutic intervention in order to improve the following deficits and impairments:  Abnormal gait, Decreased endurance, Decreased activity tolerance, Decreased strength, Pain, Decreased mobility, Decreased range of motion, Hypermobility, Improper body  mechanics  Visit Diagnosis: Chronic bilateral low back pain without sciatica  Muscle weakness (generalized)     Problem List Patient Active Problem List   Diagnosis Date Noted  . Vitamin D deficiency, currently on 50K IU x 12 weeks 01/2018 01/05/2018  . Psoriasis, clobetasol, referred to Dermatology 01/05/2018  . Chronic low back pain with sciatica 01/05/2018  . Hisotry of microcytic anemia, high risk after bariatric surgery 12/07/2017  . IUD (intrauterine device) in place, Liletta 12/07/2017  . Tension headache 10/04/2016  . PCOS, Metformin, IUD 10/25/2014  . Migraine with aura 01/11/2014  . Lapband APS resited with [facility] repair June 2013 05/08/2012  . HLD (hyperlipidemia), last checked 12/08/17, LDL 128, HDL 78 04/15/2008  . Morbid obesity ([place]) 04/15/2008  . Allergic rhinitis, Zyrtec and Singulair 04/15/2008  . Mild intermittent asthma, albuterol prn 04/15/2008   Lyndee Hensen, PT, DPT 9:28 AM  02/26/18    [facility] [place], [place], [address] Phone: [phone]   Fax:  [phone]  Name: Olivia [name] MRN: [id number] Date of Birth: Oct 27, 1966

## 2018-03-02 ENCOUNTER — Ambulatory Visit: Payer: No Typology Code available for payment source | Admitting: Physical Therapy

## 2018-03-02 ENCOUNTER — Encounter: Payer: Self-pay | Admitting: Physical Therapy

## 2018-03-02 DIAGNOSIS — G8929 Other chronic pain: Secondary | ICD-10-CM

## 2018-03-02 DIAGNOSIS — M6281 Muscle weakness (generalized): Secondary | ICD-10-CM | POA: Diagnosis not present

## 2018-03-02 DIAGNOSIS — M545 Low back pain: Secondary | ICD-10-CM | POA: Diagnosis not present

## 2018-03-02 NOTE — Therapy (Signed)
[place] [address] [place], [place], [address] Phone: [phone]   Fax:  [phone]  Physical Therapy Treatment  Patient Details  Name: Olivia [name] MRN: [id number] Date of Birth: April 13, 1966 Referring Provider: Briscoe Deutscher   Encounter Date: 03/02/2018  PT End of Session - 03/02/18 1425    Visit Number  9    Number of Visits  16    Date for PT Re-Evaluation  03/16/18    Authorization Type  [facility]     PT Start Time  0805    PT Stop Time  0850    PT Time Calculation (min)  45 min    Activity Tolerance  Patient tolerated treatment well    Behavior During Therapy  [facility] for tasks assessed/performed       Past Medical History:  Diagnosis Date  . Asthma   . Contact lens/glasses   . Environmental allergies   . Hiatal hernia   . HLD (hyperlipidemia) 04/15/2008  . Microcytic anemia   . Migraine, hormonal   . PACs (premature atrial contraction), s/p Cardiology evaluation   . PCOS (polycystic ovarian syndrome) 10/25/2014  . Pneumonia 3/13  . PONV (postoperative nausea and vomiting)   . Reflux   . Seasonal allergies     Past Surgical History:  Procedure Laterality Date  . HIATAL HERNIA REPAIR  04/14/2012   Procedure: LAPAROSCOPIC REPAIR OF HIATAL HERNIA;  Surgeon: Pedro Earls, MD;  Location: [facility];  Service: General;  Laterality: N/A;  Hiatal Hernia Repair-Replacement of Lap Band  . LAPAROSCOPIC GASTRIC BANDING  08/08/2008  . TONSILLECTOMY  1981    There were no vitals filed for this visit.  Subjective Assessment - 03/02/18 0814    Subjective  Pt states that she is doing better overall, she twisted wrong going down stairs, had minor pain in back, but was better after a day or so.     Currently in Pain?  Yes    Pain Score  2     Pain Location  Back    Pain Orientation  Left;Right    Pain Descriptors / Indicators  Tightness    Pain Type  Chronic pain    Pain Onset  More than a month ago    Pain Frequency   Intermittent                       [facility] Adult PT Treatment/Exercise - 03/02/18 0001      Lumbar Exercises: Quadruped   [place]  20 reps    Opposite Arm/Leg Raise  10 reps;Limitations    Opposite Arm/Leg Raise Limitations  LE only              PT Education - 03/02/18 1425    Education provided  Yes    Education Details  HEP, body mechanics with ther ex    Person(s) Educated  Patient    Methods  Explanation    Comprehension  Verbalized understanding       PT Short Term Goals - 01/19/18 0958      PT SHORT TERM GOAL #1   Title  Pt to demo independence with initial HEp     Time  3    Period  Weeks    Status  New    Target Date  02/09/18      PT SHORT TERM GOAL #2   Title  Pt to demo ability for R SB without fear  Time  3    Period  Weeks    Status  New    Target Date  02/09/18        PT Long Term Goals - 01/19/18 0959      PT LONG TERM GOAL #1   Title  Pt to demo ability for full, painfree lumbar sidebending and rotation    Time  8    Period  Weeks    Status  New    Target Date  03/16/18      PT LONG TERM GOAL #2   Title  Pt to demo increased core and hip strength to at least 4+/5 to improve stability and pain     Time  8    Period  Weeks    Status  New    Target Date  03/16/18      PT LONG TERM GOAL #3   Title  Pt to demo independence with final HEP for long term strengthening and stabilization     Time  8    Period  Weeks    Status  New    Target Date  03/16/18      PT LONG TERM GOAL #4   Title  Pt to demo ability for proper squat mechanics and reaching mechanics, to improve ability and confidence with work duties     Time  8    Period  Weeks    Status  New    Target Date  03/16/18            Plan - 03/02/18 1427    Clinical Impression Statement  Pt progressing with ability to perform strengthening exercises, and for ROM. She demonstrates core instability and increased weakness in R hip vs L, seen with  postural changes during stabilization exercises. Pt to benefit from continued care.     Rehab Potential  Good    PT Frequency  2x / week    PT Duration  8 weeks    PT Treatment/Interventions  ADLs/Self Care Home Management;Cryotherapy;Electrical Stimulation;Moist Heat;Therapeutic activities;Iontophoresis 4mg /ml Dexamethasone;Functional mobility training;Stair training;Gait training;Ultrasound;Therapeutic exercise;Neuromuscular re-education;Patient/family education;Orthotic Fit/Training;Dry needling;Passive range of motion;Manual techniques;Taping;Vasopneumatic Device    PT Next Visit Plan  Review HEP, add core and hip strength, Manual L paraspinal and L glut     Consulted and Agree with Plan of Care  Patient       Patient will benefit from skilled therapeutic intervention in order to improve the following deficits and impairments:  Abnormal gait, Decreased endurance, Decreased activity tolerance, Decreased strength, Pain, Decreased mobility, Decreased range of motion, Hypermobility, Improper body mechanics  Visit Diagnosis: Chronic bilateral low back pain without sciatica  Muscle weakness (generalized)     Problem List Patient Active Problem List   Diagnosis Date Noted  . Vitamin D deficiency, currently on 50K IU x 12 weeks 01/2018 01/05/2018  . Psoriasis, clobetasol, referred to Dermatology 01/05/2018  . Chronic low back pain with sciatica 01/05/2018  . Hisotry of microcytic anemia, high risk after bariatric surgery 12/07/2017  . IUD (intrauterine device) in place, Liletta 12/07/2017  . Tension headache 10/04/2016  . PCOS, Metformin, IUD 10/25/2014  . Migraine with aura 01/11/2014  . Lapband APS resited with [name] repair June 2013 05/08/2012  . HLD (hyperlipidemia), last checked 12/08/17, LDL 128, HDL 78 04/15/2008  . Morbid obesity ([place]) 04/15/2008  . Allergic rhinitis, Zyrtec and Singulair 04/15/2008  . Mild intermittent asthma, albuterol prn 04/15/2008   Lyndee Hensen, PT,  DPT 2:31 PM  03/02/18  [place] [address] [place], [place], [address] Phone: [phone]   Fax:  [phone]  Name: Olivia [name] MRN: [id number] Date of Birth: Aug 26, 1966

## 2018-03-09 ENCOUNTER — Encounter: Payer: Self-pay | Admitting: Physical Therapy

## 2018-03-09 ENCOUNTER — Ambulatory Visit: Payer: No Typology Code available for payment source | Admitting: Physical Therapy

## 2018-03-09 DIAGNOSIS — M6281 Muscle weakness (generalized): Secondary | ICD-10-CM

## 2018-03-09 DIAGNOSIS — M545 Low back pain, unspecified: Secondary | ICD-10-CM

## 2018-03-09 DIAGNOSIS — G8929 Other chronic pain: Secondary | ICD-10-CM | POA: Diagnosis not present

## 2018-03-09 NOTE — Therapy (Signed)
[employer] [place], [place], [address] Phone: [phone]   Fax:  [phone]  Physical Therapy Treatment  Patient Details  Name: Olivia [name] MRN: [id number] Date of Birth: 07/14/1966 Referring Provider: Briscoe Deutscher   Encounter Date: 03/09/2018  PT End of Session - 03/09/18 0848    Visit Number  10    Number of Visits  16    Date for PT Re-Evaluation  03/16/18    Authorization Type  [facility]     PT Start Time  0802    PT Stop Time  0845    PT Time Calculation (min)  43 min    Activity Tolerance  Patient tolerated treatment well    Behavior During Therapy  [facility] for tasks assessed/performed       Past Medical History:  Diagnosis Date  . Asthma   . Contact lens/glasses   . Environmental allergies   . Hiatal hernia   . HLD (hyperlipidemia) 04/15/2008  . Microcytic anemia   . Migraine, hormonal   . PACs (premature atrial contraction), s/p Cardiology evaluation   . PCOS (polycystic ovarian syndrome) 10/25/2014  . Pneumonia 3/13  . PONV (postoperative nausea and vomiting)   . Reflux   . Seasonal allergies     Past Surgical History:  Procedure Laterality Date  . HIATAL HERNIA REPAIR  04/14/2012   Procedure: LAPAROSCOPIC REPAIR OF HIATAL HERNIA;  Surgeon: Pedro Earls, MD;  Location: [facility];  Service: General;  Laterality: N/A;  Hiatal Hernia Repair-Replacement of Lap Band  . LAPAROSCOPIC GASTRIC BANDING  08/08/2008  . TONSILLECTOMY  1981    There were no vitals filed for this visit.  Subjective Assessment - 03/09/18 0810    Subjective  Pt states that her back has been doing very good, much less pain overall. Did not do HEP as often as needed.     Currently in Pain?  No/denies    Pain Score  0-No pain                       [facility] Adult PT Treatment/Exercise - 03/09/18 0817      Lumbar Exercises: Stretches   Other Lumbar Stretch Exercise  Standing QL stretch 30 sec x3 Bil    Other  Lumbar Stretch Exercise  Childs pose 30 sec x3 center, seated lumbar flexion      Lumbar Exercises: Aerobic   Stationary Bike  L2 x 8 min      Lumbar Exercises: Standing   Other Standing Lumbar Exercises  Hip Abd 2x10 each bil;  Squat with Rows  GTB x20 each with TA:  Lat pull down GTB x20       Lumbar Exercises: Supine   Dead Bug  20 reps    Straight Leg Raises Limitations  2x 10 bil      Lumbar Exercises: Quadruped   Madcat/Old Horse  20 reps    Opposite Arm/Leg Raise  Limitations;20 reps    Opposite Arm/Leg Raise Limitations  x20 LE; x10 UE/LE             PT Education - 03/09/18 1121    Education provided  Yes    Education Details  Reinforced HEP     Person(s) Educated  Patient    Methods  Explanation    Comprehension  Verbalized understanding       PT Short Term Goals - 01/19/18 0958      PT SHORT TERM GOAL #1  Title  Pt to demo independence with initial HEp     Time  3    Period  Weeks    Status  New    Target Date  02/09/18      PT SHORT TERM GOAL #2   Title  Pt to demo ability for R SB without fear    Time  3    Period  Weeks    Status  New    Target Date  02/09/18        PT Long Term Goals - 01/19/18 0959      PT LONG TERM GOAL #1   Title  Pt to demo ability for full, painfree lumbar sidebending and rotation    Time  8    Period  Weeks    Status  New    Target Date  03/16/18      PT LONG TERM GOAL #2   Title  Pt to demo increased core and hip strength to at least 4+/5 to improve stability and pain     Time  8    Period  Weeks    Status  New    Target Date  03/16/18      PT LONG TERM GOAL #3   Title  Pt to demo independence with final HEP for long term strengthening and stabilization     Time  8    Period  Weeks    Status  New    Target Date  03/16/18      PT LONG TERM GOAL #4   Title  Pt to demo ability for proper squat mechanics and reaching mechanics, to improve ability and confidence with work duties     Time  8    Period   Weeks    Status  New    Target Date  03/16/18            Plan - 03/09/18 1122    Clinical Impression Statement  Pt making progress with pain, she is having significantly less pain on daily basis. Reinforced need for HEP when not in PT, for continuing strengthening and stabilization. Pt continues to show instability with ther ex, with increased postural changes, with more difficulty exercises. Hip strength improving. Pt to benefit from continued care for stabilization.     Rehab Potential  Good    PT Frequency  2x / week    PT Duration  8 weeks    PT Treatment/Interventions  ADLs/Self Care Home Management;Cryotherapy;Electrical Stimulation;Moist Heat;Therapeutic activities;Iontophoresis 4mg /ml Dexamethasone;Functional mobility training;Stair training;Gait training;Ultrasound;Therapeutic exercise;Neuromuscular re-education;Patient/family education;Orthotic Fit/Training;Dry needling;Passive range of motion;Manual techniques;Taping;Vasopneumatic Device    PT Next Visit Plan  Review HEP, add core and hip strength, Manual L paraspinal and L glut     Consulted and Agree with Plan of Care  Patient       Patient will benefit from skilled therapeutic intervention in order to improve the following deficits and impairments:  Abnormal gait, Decreased endurance, Decreased activity tolerance, Decreased strength, Pain, Decreased mobility, Decreased range of motion, Hypermobility, Improper body mechanics  Visit Diagnosis: Chronic bilateral low back pain without sciatica  Muscle weakness (generalized)     Problem List Patient Active Problem List   Diagnosis Date Noted  . Vitamin D deficiency, currently on 50K IU x 12 weeks 01/2018 01/05/2018  . Psoriasis, clobetasol, referred to Dermatology 01/05/2018  . Chronic low back pain with sciatica 01/05/2018  . Hisotry of microcytic anemia, high risk after bariatric surgery 12/07/2017  . IUD (intrauterine  device) in place, Liletta 12/07/2017  . Tension  headache 10/04/2016  . PCOS, Metformin, IUD 10/25/2014  . Migraine with aura 01/11/2014  . Lapband APS resited with [facility] repair June 2013 05/08/2012  . HLD (hyperlipidemia), last checked 12/08/17, LDL 128, HDL 78 04/15/2008  . Morbid obesity ([place]) 04/15/2008  . Allergic rhinitis, Zyrtec and Singulair 04/15/2008  . Mild intermittent asthma, albuterol prn 04/15/2008    Lyndee Hensen, PT, DPT 11:26 AM  03/09/18    [facility] [place], [place], [address] Phone: [phone]   Fax:  [phone]  Name: Olivia [name] MRN: [id number] Date of Birth: February 19, 1966

## 2018-03-19 ENCOUNTER — Encounter: Payer: Self-pay | Admitting: Physical Therapy

## 2018-03-19 ENCOUNTER — Ambulatory Visit: Payer: No Typology Code available for payment source | Admitting: Physical Therapy

## 2018-03-19 DIAGNOSIS — M6281 Muscle weakness (generalized): Secondary | ICD-10-CM | POA: Diagnosis not present

## 2018-03-19 DIAGNOSIS — M545 Low back pain: Secondary | ICD-10-CM | POA: Diagnosis not present

## 2018-03-19 DIAGNOSIS — G8929 Other chronic pain: Secondary | ICD-10-CM | POA: Diagnosis not present

## 2018-03-19 NOTE — Therapy (Signed)
[place], [place], [address] Phone: [phone]   Fax:  [phone]  Physical Therapy Treatment  Patient Details  Name: Olivia [name] MRN: [id number] Date of Birth: 07/11/1966 Referring Provider: Briscoe Deutscher   Encounter Date: 03/19/2018  PT End of Session - 03/19/18 0808    Visit Number  11    Number of Visits  16    Date for PT Re-Evaluation  03/16/18    Authorization Type  [facility]     PT Start Time  0803    PT Stop Time  8938    PT Time Calculation (min)  44 min    Activity Tolerance  Patient tolerated treatment well    Behavior During Therapy  [facility] for tasks assessed/performed       Past Medical History:  Diagnosis Date  . Asthma   . Contact lens/glasses   . Environmental allergies   . Hiatal hernia   . HLD (hyperlipidemia) 04/15/2008  . Microcytic anemia   . Migraine, hormonal   . PACs (premature atrial contraction), s/p Cardiology evaluation   . PCOS (polycystic ovarian syndrome) 10/25/2014  . Pneumonia 3/13  . PONV (postoperative nausea and vomiting)   . Reflux   . Seasonal allergies     Past Surgical History:  Procedure Laterality Date  . HIATAL HERNIA REPAIR  04/14/2012   Procedure: LAPAROSCOPIC REPAIR OF HIATAL HERNIA;  Surgeon: Pedro Earls, MD;  Location: [facility];  Service: General;  Laterality: N/A;  Hiatal Hernia Repair-Replacement of Lap Band  . LAPAROSCOPIC GASTRIC BANDING  08/08/2008  . TONSILLECTOMY  1981    There were no vitals filed for this visit.  Subjective Assessment - 03/19/18 0806    Subjective  Pt states increased pain in L SI region a few days ago, she was standing at sink, and twisted, which sent pain into back and LE. SHe states significant pain for 2-3 days, and decreased ability for sitting and walking, pain is decreased today, but still present.     Limitations  Sitting;House hold activities;Walking;Standing;Lifting    Pain Score  3     Pain Location  Back     Pain Orientation  Right    Pain Descriptors / Indicators  Tightness    Pain Type  Chronic pain    Pain Onset  More than a month ago    Pain Frequency  Intermittent                       [facility] Adult PT Treatment/Exercise - 03/19/18 0805      Lumbar Exercises: Stretches   Other Lumbar Stretch Exercise  Standing QL stretch 30 sec x3 Bil    Other Lumbar Stretch Exercise  --      Lumbar Exercises: Aerobic   Stationary Bike  L2 x 8 min      Lumbar Exercises: Standing   Other Standing Lumbar Exercises  Hip Abd 2x10 each bil;  Squat with Rows  GTB x20 each with TA:        Lumbar Exercises: Supine   Dead Bug  20 reps    Straight Leg Raises Limitations  2x 10 bil      Lumbar Exercises: Sidelying   Hip Abduction  Both;20 reps      Lumbar Exercises: Quadruped   Madcat/Old Horse  --    Opposite Arm/Leg Raise  --    Opposite Arm/Leg Raise Limitations  --  Manual Therapy   Manual Therapy  Soft tissue mobilization    Soft tissue mobilization  DTM to L glut and L lumbar paraspinals               PT Short Term Goals - 01/19/18 0958      PT SHORT TERM GOAL #1   Title  Pt to demo independence with initial HEp     Time  3    Period  Weeks    Status  New    Target Date  02/09/18      PT SHORT TERM GOAL #2   Title  Pt to demo ability for R SB without fear    Time  3    Period  Weeks    Status  New    Target Date  02/09/18        PT Long Term Goals - 01/19/18 0959      PT LONG TERM GOAL #1   Title  Pt to demo ability for full, painfree lumbar sidebending and rotation    Time  8    Period  Weeks    Status  New    Target Date  03/16/18      PT LONG TERM GOAL #2   Title  Pt to demo increased core and hip strength to at least 4+/5 to improve stability and pain     Time  8    Period  Weeks    Status  New    Target Date  03/16/18      PT LONG TERM GOAL #3   Title  Pt to demo independence with final HEP for long term strengthening and  stabilization     Time  8    Period  Weeks    Status  New    Target Date  03/16/18      PT LONG TERM GOAL #4   Title  Pt to demo ability for proper squat mechanics and reaching mechanics, to improve ability and confidence with work duties     Time  8    Period  Weeks    Status  New    Target Date  03/16/18            Plan - 03/19/18 0855    Clinical Impression Statement  Pt with increased soreness at L glut and SI region today with palpation and DTM. She was able to perform light strengthening exercises without increased pain. Recommended that pt be very consistent with performing HEP, and stabilization exercses. She is improving with strength, but still demonstrates moderate instability, and requires cueing for TA contraction. Recommend continued care.     Rehab Potential  Good    PT Frequency  2x / week    PT Duration  8 weeks    PT Treatment/Interventions  ADLs/Self Care Home Management;Cryotherapy;Electrical Stimulation;Moist Heat;Therapeutic activities;Iontophoresis 4mg /ml Dexamethasone;Functional mobility training;Stair training;Gait training;Ultrasound;Therapeutic exercise;Neuromuscular re-education;Patient/family education;Orthotic Fit/Training;Dry needling;Passive range of motion;Manual techniques;Taping;Vasopneumatic Device    PT Next Visit Plan  Review HEP, add core and hip strength, Manual L paraspinal and L glut     Consulted and Agree with Plan of Care  Patient       Patient will benefit from skilled therapeutic intervention in order to improve the following deficits and impairments:  Abnormal gait, Decreased endurance, Decreased activity tolerance, Decreased strength, Pain, Decreased mobility, Decreased range of motion, Hypermobility, Improper body mechanics  Visit Diagnosis: Chronic bilateral low back pain without sciatica  Muscle weakness (generalized)  Problem List Patient Active Problem List   Diagnosis Date Noted  . Vitamin D deficiency, currently on  50K IU x 12 weeks 01/2018 01/05/2018  . Psoriasis, clobetasol, referred to Dermatology 01/05/2018  . Chronic low back pain with sciatica 01/05/2018  . Hisotry of microcytic anemia, high risk after bariatric surgery 12/07/2017  . IUD (intrauterine device) in place, Liletta 12/07/2017  . Tension headache 10/04/2016  . PCOS, Metformin, IUD 10/25/2014  . Migraine with aura 01/11/2014  . Lapband APS resited with [facility] repair June 2013 05/08/2012  . HLD (hyperlipidemia), last checked 12/08/17, LDL 128, HDL 78 04/15/2008  . Morbid obesity ([place]) 04/15/2008  . Allergic rhinitis, Zyrtec and Singulair 04/15/2008  . Mild intermittent asthma, albuterol prn 04/15/2008    Lyndee Hensen, PT, DPT 9:17 AM  03/19/18   [facility] [place], [place], [address] Phone: [phone]   Fax:  [phone]  Name: AAMIYAH DERRICK MRN: [id number] Date of Birth: 1965-12-21

## 2018-03-26 ENCOUNTER — Other Ambulatory Visit: Payer: Self-pay | Admitting: *Deleted

## 2018-03-26 ENCOUNTER — Other Ambulatory Visit: Payer: Self-pay

## 2018-03-26 ENCOUNTER — Telehealth: Payer: Self-pay | Admitting: Family Medicine

## 2018-03-26 MED ORDER — METFORMIN HCL ER 500 MG PO TB24: 1000.0000 mg | ORAL_TABLET | Freq: Every day | ORAL | 3 refills | Status: DC

## 2018-03-26 MED ORDER — MONTELUKAST SODIUM 10 MG PO TABS: 10.0000 mg | ORAL_TABLET | Freq: Every day | ORAL | 1 refills | Status: DC

## 2018-03-26 MED FILL — MONTELUKAST SOD 10 MG TAB: 10 | 90 days supply | Qty: 90 | Fill #0

## 2018-03-26 MED FILL — METFORMIN HCL ER 500 MG TAB: 500 | 90 days supply | Qty: 180 | Fill #0

## 2018-03-26 NOTE — Telephone Encounter (Signed)
Copied from [employer] [id number]. Topic: Inquiry >> Mar 26, 2018 10:37 AM Pricilla Handler wrote: Reason for CRM: Patient called requesting refills of both MetFORMIN (GLUCOPHAGE-XR) 500 MG 24 hr tablet and Montelukast (SINGULAIR) 10 MG tablet. Patient's preferred pharmacy is [place], [place] - [address]. [phone] (Phone)  [phone] (Fax).         Thank You!!!

## 2018-03-26 NOTE — Telephone Encounter (Signed)
Refill sent my chart sent to let patient know.  

## 2018-03-26 NOTE — Telephone Encounter (Signed)
See note

## 2018-03-26 NOTE — Telephone Encounter (Signed)
Rx for requested prescriptions refilled- next OV- 04/07/18- patient is up to date with visits

## 2018-03-28 ENCOUNTER — Encounter (HOSPITAL_COMMUNITY): Payer: Self-pay | Admitting: *Deleted

## 2018-03-28 ENCOUNTER — Ambulatory Visit (INDEPENDENT_AMBULATORY_CARE_PROVIDER_SITE_OTHER): Payer: PRIVATE HEALTH INSURANCE

## 2018-03-28 ENCOUNTER — Ambulatory Visit (HOSPITAL_COMMUNITY)
Admission: EM | Admit: 2018-03-28 | Discharge: 2018-03-28 | Disposition: A | Discharge status: Home / Self Care | Payer: PRIVATE HEALTH INSURANCE | Attending: Family Medicine | Admitting: Family Medicine

## 2018-03-28 ENCOUNTER — Ambulatory Visit (HOSPITAL_COMMUNITY): Payer: PRIVATE HEALTH INSURANCE

## 2018-03-28 DIAGNOSIS — M79645 Pain in left finger(s): Secondary | ICD-10-CM | POA: Diagnosis not present

## 2018-03-28 DIAGNOSIS — S0181XA Laceration without foreign body of other part of head, initial encounter: Secondary | ICD-10-CM | POA: Diagnosis not present

## 2018-03-28 NOTE — ED Triage Notes (Signed)
Assessment per Dr Lauenstein. 

## 2018-03-28 NOTE — ED Provider Notes (Signed)
[facility]   [id number] 03/28/18 Arrival Time: 1909   SUBJECTIVE:  Olivia [name] is a 52 y.o. female who presents to the urgent care with complaint of falling in one of our exam rooms after tripping on open cabinet.  She hit her left forehead and lacerated the area just above the eyebrow.  She also fell on outstretched left hand and has pain at the base of the left thumb with FROM     Past Medical History:  Diagnosis Date  . Asthma   . Contact lens/glasses   . Environmental allergies   . Hiatal hernia   . HLD (hyperlipidemia) 04/15/2008  . Microcytic anemia   . Migraine, hormonal   . PACs (premature atrial contraction), s/p Cardiology evaluation   . PCOS (polycystic ovarian syndrome) 10/25/2014  . Pneumonia 3/13  . PONV (postoperative nausea and vomiting)   . Reflux   . Seasonal allergies    Family History  Problem Relation Age of Onset  . Diabetes Mother   . Obesity Mother   . Colon polyps Mother   . Colon cancer Mother   . Obesity Father   . Hypertension Father   . Obesity Sister    Social History   Socioeconomic History  . Marital status: Married    Spouse name: Not on file  . Number of children: Not on file  . Years of education: Not on file  . Highest education level: Not on file  Occupational History    Employer: [employer]  . Financial resource strain: Not on file  . Food insecurity:    Worry: Not on file    Inability: Not on file  . Transportation needs:    Medical: Not on file    Non-medical: Not on file  Tobacco Use  . Smoking status: Never Smoker  . Smokeless tobacco: Never Used  Substance and Sexual Activity  . Alcohol use: No  . Drug use: No  . Sexual activity: Yes    Birth control/protection: IUD    Comment: Vasectomy in spouse  Lifestyle  . Physical activity:    Days per week: Not on file    Minutes per session: Not on file  . Stress: Not on file  Relationships  . Social connections:    Talks on  phone: Not on file    Gets together: Not on file    Attends religious service: Not on file    Active member of club or organization: Not on file    Attends meetings of clubs or organizations: Not on file    Relationship status: Not on file  . Intimate partner violence:    Fear of current or ex partner: Not on file    Emotionally abused: Not on file    Physically abused: Not on file    Forced sexual activity: Not on file  Other Topics Concern  . Not on file  Social History Narrative  . Not on file   No outpatient medications have been marked as taking for the 03/28/18 encounter [facility] Encounter).   Allergies  Allergen Reactions  . Cephalosporins Anaphylaxis  . Eggs Or Egg-Derived Products Hives and Other (See Comments)    Angioedema  . Penicillins Anaphylaxis  . Sulfa Drugs Cross Reactors     Liver failure  . Effexor [Venlafaxine] Other (See Comments)    Lightheadedness, dizzy, headache      ROS: As per HPI, remainder of ROS negative.   OBJECTIVE:   Vitals:  03/28/18 1932  BP: (!) 148/86  Pulse: 65  Resp: 16  Temp: 97.9 F (36.6 C)  TempSrc: Oral  SpO2: 95%     General appearance: alert; no distress Eyes: PERRL; EOMI; conjunctiva normal HENT: normocephalic; 1.5 cm full-thickness laceration above left eyebrow; oral mucosa normal Neck: supple Back: no CVA tenderness Extremities: no cyanosis or edema; symmetrical with no gross deformities; tender base of left thumb in thenar region as well as dorsal metacarpal of thumb. Skin: warm and dry Neurologic: normal gait; grossly normal Psychological: alert and cooperative; normal mood and affect      Labs:  Results for orders placed or performed in visit on 12/08/17  CBC with Differential/Platelet  Result Value Ref Range   WBC 6.4 4.0 - 10.5 K/uL   RBC 5.10 3.87 - 5.11 Mil/uL   Hemoglobin 14.4 12.0 - 15.0 g/dL   HCT 42.7 36.0 - 46.0 %   MCV 83.7 78.0 - 100.0 fl   MCHC 33.8 30.0 - 36.0 g/dL   RDW 14.1  11.5 - 15.5 %   Platelets 273.0 150.0 - 400.0 K/uL   Neutrophils Relative % 60.8 43.0 - 77.0 %   Lymphocytes Relative 28.8 12.0 - 46.0 %   Monocytes Relative 6.9 3.0 - 12.0 %   Eosinophils Relative 2.7 0.0 - 5.0 %   Basophils Relative 0.8 0.0 - 3.0 %   Neutro Abs 3.9 1.4 - 7.7 K/uL   Lymphs Abs 1.9 0.7 - 4.0 K/uL   Monocytes Absolute 0.4 0.1 - 1.0 K/uL   Eosinophils Absolute 0.2 0.0 - 0.7 K/uL   Basophils Absolute 0.1 0.0 - 0.1 K/uL  Comprehensive metabolic panel  Result Value Ref Range   Sodium 138 135 - 145 mEq/L   Potassium 4.1 3.5 - 5.1 mEq/L   Chloride 106 96 - 112 mEq/L   CO2 24 19 - 32 mEq/L   Glucose, Bld 98 70 - 99 mg/dL   BUN 12 6 - 23 mg/dL   Creatinine, Ser 0.68 0.40 - 1.20 mg/dL   Total Bilirubin 0.7 0.2 - 1.2 mg/dL   Alkaline Phosphatase 48 39 - 117 U/L   AST 14 0 - 37 U/L   ALT 11 0 - 35 U/L   Total Protein 6.8 6.0 - 8.3 g/dL   Albumin 3.9 3.5 - 5.2 g/dL   Calcium 9.9 8.4 - 10.5 mg/dL   GFR 97.44 >60.00 mL/min  Vitamin B12  Result Value Ref Range   Vitamin B-12 391 211 - 911 pg/mL  TSH  Result Value Ref Range   TSH 2.21 0.35 - 4.50 uIU/mL  Hemoglobin A1c  Result Value Ref Range   Hgb A1c MFr Bld 5.5 4.6 - 6.5 %  Lipid panel  Result Value Ref Range   Cholesterol 229 (H) 0 - 200 mg/dL   Triglycerides 112.0 0.0 - 149.0 mg/dL   HDL 78.10 >39.00 mg/dL   VLDL 22.4 0.0 - 40.0 mg/dL   LDL Cholesterol 128 (H) 0 - 99 mg/dL   Total CHOL/HDL Ratio 3    NonHDL 150.82   VITAMIN D 25 Hydroxy (Vit-D Deficiency, Fractures)  Result Value Ref Range   VITD 19.28 (L) 30.00 - 100.00 ng/mL  Iron, TIBC and Ferritin Panel  Result Value Ref Range   Iron 56 40 - 190 mcg/dL   TIBC 387 250 - 450 mcg/dL (calc)   %SAT 14 11 - 50 % (calc)   Ferritin 16 10 - 232 ng/mL  Thyroid peroxidase antibody  Result Value Ref Range  Thyroperoxidase Ab SerPl-aCnc 1 <9 IU/mL    Labs Reviewed - No data to display  No results found.     ASSESSMENT & PLAN:  1. Facial laceration,  initial encounter   2. Thumb pain, left     No orders of the defined types were placed in this encounter.   Reviewed expectations re: course of current medical issues. Questions answered. Outlined signs and symptoms indicating need for more acute intervention. Patient verbalized understanding. After Visit Summary given.    Procedures:  The left forehead laceration was prepped with beta Dean, anesthetized with 2% Xylocaine without epi, and then repaired with 6 5-0 Prolene interrupted sutures. There were no complications.      Robyn Haber, MD 03/28/18 Joen Laura

## 2018-03-28 NOTE — Discharge Instructions (Addendum)
Sutures out in 5 days  Ibuprofen for pain

## 2018-03-31 ENCOUNTER — Encounter: Payer: Self-pay | Admitting: Physical Therapy

## 2018-03-31 ENCOUNTER — Ambulatory Visit: Payer: No Typology Code available for payment source | Admitting: Physical Therapy

## 2018-03-31 DIAGNOSIS — G8929 Other chronic pain: Secondary | ICD-10-CM | POA: Diagnosis not present

## 2018-03-31 DIAGNOSIS — M545 Low back pain, unspecified: Secondary | ICD-10-CM

## 2018-03-31 DIAGNOSIS — M6281 Muscle weakness (generalized): Secondary | ICD-10-CM | POA: Diagnosis not present

## 2018-03-31 NOTE — Therapy (Signed)
[facility] [address] [place], [place], [address] Phone: [phone]   Fax:  [phone]  Physical Therapy Treatment/ReCert  Patient Details  Name: Olivia [name] MRN: [id number] Date of Birth: 1966-05-01 Referring Provider: Briscoe Deutscher   Encounter Date: 03/31/2018  PT End of Session - 03/31/18 1131    Visit Number  12    Number of Visits  16    Date for PT Re-Evaluation  04/13/18    Authorization Type  [facility]     PT Start Time  0800    PT Stop Time  0841    PT Time Calculation (min)  41 min    Activity Tolerance  Patient tolerated treatment well    Behavior During Therapy  [facility] for tasks assessed/performed       Past Medical History:  Diagnosis Date  . Asthma   . Contact lens/glasses   . Environmental allergies   . Hiatal hernia   . HLD (hyperlipidemia) 04/15/2008  . Microcytic anemia   . Migraine, hormonal   . PACs (premature atrial contraction), s/p Cardiology evaluation   . PCOS (polycystic ovarian syndrome) 10/25/2014  . Pneumonia 3/13  . PONV (postoperative nausea and vomiting)   . Reflux   . Seasonal allergies     Past Surgical History:  Procedure Laterality Date  . HIATAL HERNIA REPAIR  04/14/2012   Procedure: LAPAROSCOPIC REPAIR OF HIATAL HERNIA;  Surgeon: Pedro Earls, MD;  Location: [facility];  Service: General;  Laterality: N/A;  Hiatal Hernia Repair-Replacement of Lap Band  . LAPAROSCOPIC GASTRIC BANDING  08/08/2008  . TONSILLECTOMY  1981    There were no vitals filed for this visit.  Subjective Assessment - 03/31/18 1129    Subjective  Pt states back is doing well, less pain, and improving mobility. She has what she describes as "tightness" in low back at timed during the day.  She was able to help husband clean out garage this weekend, reaching, lifting, carrying, which she has not been able to do for quite some time, due to pain. Pt also had a fall at work this weekend, and has Bruise on L eye with  stitches above L eye, buising at Knees and brace on L hand due to thumb pain, she tripped and fell.     Currently in Pain?  No/denies    Pain Score  0-No pain         [facility] PT Assessment - 03/31/18 0001      AROM   Overall AROM Comments  Lumbar ROM: WNL/hypermobile      Strength   Overall Strength Comments  Core: 4/5;  Hips: 4/5                   [facility] Adult PT Treatment/Exercise - 03/31/18 0001      Lumbar Exercises: Stretches   Other Lumbar Stretch Exercise  Standing QL stretch 30 sec x3 Bil      Lumbar Exercises: Aerobic   Stationary Bike  L2 x 8 min      Lumbar Exercises: Standing   Other Standing Lumbar Exercises  Hip Abd 2x10 each bil;      Other Standing Lumbar Exercises  Squats x 20       Lumbar Exercises: Supine   Clam  20 reps;Limitations    Clam Limitations  alternating with GTB    Dead Bug  20 reps    Straight Leg Raises Limitations  2x 10 bil    Other  Supine Lumbar Exercises  Supine hip Add ball squeeze x20 with TA      Lumbar Exercises: Sidelying   Hip Abduction  Both;20 reps               PT Short Term Goals - 03/31/18 1133      PT SHORT TERM GOAL #1   Title  Pt to demo independence with initial HEp     Time  3    Period  Weeks    Status  Achieved      PT SHORT TERM GOAL #2   Title  Pt to demo ability for R SB without fear    Time  3    Period  Weeks    Status  Achieved        PT Long Term Goals - 03/31/18 1134      PT LONG TERM GOAL #1   Title  Pt to demo ability for full, painfree lumbar sidebending and rotation    Time  8    Period  Weeks    Status  Achieved      PT LONG TERM GOAL #2   Title  Pt to demo increased core and hip strength to at least 4+/5 to improve stability and pain     Time  8    Period  Weeks    Status  Partially Met      PT LONG TERM GOAL #3   Title  Pt to demo independence with final HEP for long term strengthening and stabilization     Time  8    Period  Weeks    Status  Partially Met       PT LONG TERM GOAL #4   Title  Pt to demo ability for proper squat mechanics and reaching mechanics, to improve ability and confidence with work duties     Time  8    Period  Weeks    Status  Partially Met            Plan - 03/31/18 1135    Clinical Impression Statement  Pt with decreased pain in lumbar region with palpation today, She has mild tenderness of spinous processes (which she states is her baseline). She has no pain at L lower ribs with spring testing. She has much improved ability for lumbar and thoraic mobility without fear, apprehension or pain. She has improving strength of core and hips,and will benefit from continued strengthening and stabilization for this. Pt to benefit from one additoinal visit for review of final HEP, then plan to d/c. Some exercises modified or not performed today due to injuries from fall. Pt became mildly nausiated at end of session, exercises stopped, pt sympotm free within 2 min of sitting/ rest.     Rehab Potential  Good    PT Frequency  2x / week    PT Duration  4 weeks    PT Treatment/Interventions  ADLs/Self Care Home Management;Cryotherapy;Electrical Stimulation;Moist Heat;Therapeutic activities;Iontophoresis 3m/ml Dexamethasone;Functional mobility training;Stair training;Gait training;Ultrasound;Therapeutic exercise;Neuromuscular re-education;Patient/family education;Orthotic Fit/Training;Dry needling;Passive range of motion;Manual techniques;Taping;Vasopneumatic Device    PT Next Visit Plan  Review HEP, add core and hip strength, Manual L paraspinal and L glut     Consulted and Agree with Plan of Care  Patient       Patient will benefit from skilled therapeutic intervention in order to improve the following deficits and impairments:  Abnormal gait, Decreased endurance, Decreased activity tolerance, Decreased strength, Pain, Decreased mobility, Decreased range of motion,  Hypermobility, Improper body mechanics  Visit Diagnosis: Chronic  bilateral low back pain without sciatica  Muscle weakness (generalized)     Problem List Patient Active Problem List   Diagnosis Date Noted  . Vitamin D deficiency, currently on 50K IU x 12 weeks 01/2018 01/05/2018  . Psoriasis, clobetasol, referred to Dermatology 01/05/2018  . Chronic low back pain with sciatica 01/05/2018  . Hisotry of microcytic anemia, high risk after bariatric surgery 12/07/2017  . IUD (intrauterine device) in place, Liletta 12/07/2017  . Tension headache 10/04/2016  . PCOS, Metformin, IUD 10/25/2014  . Migraine with aura 01/11/2014  . Lapband APS resited with [facility] repair June 2013 05/08/2012  . HLD (hyperlipidemia), last checked 12/08/17, LDL 128, HDL 78 04/15/2008  . Morbid obesity ([place]) 04/15/2008  . Allergic rhinitis, Zyrtec and Singulair 04/15/2008  . Mild intermittent asthma, albuterol prn 04/15/2008     Lyndee Hensen, PT, DPT 11:42 AM  03/31/18   [facility] [place], [place], [address] Phone: [phone]   Fax:  [phone]  Name: WESTLYN GLAZA MRN: [id number] Date of Birth: 04-Mar-1966

## 2018-04-02 ENCOUNTER — Other Ambulatory Visit: Payer: Self-pay | Admitting: Family Medicine

## 2018-04-02 ENCOUNTER — Ambulatory Visit
Admission: RE | Admit: 2018-04-02 | Discharge: 2018-04-02 | Disposition: A | Discharge status: Home / Self Care | Payer: No Typology Code available for payment source | Source: Ambulatory Visit | Attending: Family Medicine | Admitting: Family Medicine

## 2018-04-02 DIAGNOSIS — T1490XA Injury, unspecified, initial encounter: Secondary | ICD-10-CM

## 2018-04-02 MED FILL — ONDANSETRON HCL 4 MG TABLET: 4 | 5 days supply | Qty: 30 | Fill #0

## 2018-04-06 NOTE — Progress Notes (Signed)
Olivia [name] is a 52 y.o. female is here for follow up.  History of Present Illness:   HPI: See Assessment and Plan section for Problem Based Charting of issues discussed today.  Health Maintenance Due  Topic Date Due  . HIV Screening  03/08/1983  . MAMMOGRAM  03/07/1986  . TETANUS/TDAP  03/08/1987  . PAP SMEAR  03/07/1989   Depression screen [facility] 2/9 12/08/2017 07/12/2016  Decreased Interest 0 3  Down, Depressed, Hopeless 2 1  PHQ - 2 Score 2 4  Altered sleeping 3 3  Tired, decreased energy 3 3  Change in appetite 1 3  Feeling bad or failure about yourself  3 2  Trouble concentrating 3 2  Moving slowly or fidgety/restless 3 2  Suicidal thoughts 0 0  PHQ-9 Score 18 19  Difficult doing work/chores Somewhat difficult Very difficult   PMHx, SurgHx, SocialHx, FamHx, Medications, and Allergies were reviewed in the Visit Navigator and updated as appropriate.   Patient Active Problem List   Diagnosis Date Noted  . Vitamin D deficiency, currently on 50K IU x 12 weeks 01/2018 01/05/2018  . Psoriasis, clobetasol, referred to Dermatology 01/05/2018  . Chronic low back pain with sciatica 01/05/2018  . Hisotry of microcytic anemia, high risk after bariatric surgery 12/07/2017  . IUD (intrauterine device) in place, Liletta 12/07/2017  . Tension headache 10/04/2016  . PCOS, Metformin, IUD 10/25/2014  . Migraine with aura 01/11/2014  . Lapband APS resited with [facility] repair June 2013 05/08/2012  . HLD (hyperlipidemia), last checked 12/08/17, LDL 128, HDL 78 04/15/2008  . Morbid obesity ([place]) 04/15/2008  . Allergic rhinitis, Zyrtec and Singulair 04/15/2008  . Mild intermittent asthma, albuterol prn 04/15/2008   Social History   Tobacco Use  . Smoking status: Never Smoker  . Smokeless tobacco: Never Used  Substance Use Topics  . Alcohol use: No  . Drug use: No   Current Medications and Allergies:   Current Outpatient Medications:  .  albuterol (PROVENTIL) (2.5 MG/3ML) 0.083%  nebulizer solution, Take 3 mLs (2.5 mg total) by nebulization every 6 (six) hours as needed for wheezing or shortness of breath., Disp: 75 mL, Rfl: 12 .  beclomethasone (QVAR REDIHALER) 80 MCG/ACT inhaler, Inhale 1 puff into the lungs 2 (two) times daily., Disp: 1 Inhaler, Rfl: 1 .  cetirizine (ZYRTEC) 10 MG tablet, Take 10 mg by mouth at bedtime. , Disp: , Rfl:  .  Cholecalciferol (VITAMIN D) 2000 units CAPS, Take 1 capsule by mouth daily., Disp: , Rfl:  .  levonorgestrel (LILETTA, 52 MG,) 18.6 MCG/DAY IUD IUD, 1 each by Intrauterine route once., Disp: , Rfl:  .  metFORMIN (GLUCOPHAGE-XR) 500 MG 24 hr tablet, Take 2 tablets (1,000 mg total) by mouth at bedtime., Disp: 60 tablet, Rfl: 3 .  montelukast (SINGULAIR) 10 MG tablet, Take 1 tablet (10 mg total) by mouth at bedtime., Disp: 90 tablet, Rfl: 1 .  Multiple Vitamin (MULITIVITAMIN WITH MINERALS) TABS, Take 1 tablet by mouth daily., Disp: , Rfl:  .  Phentermine-Topiramate 3.75-23 MG CP24, One po daily x 2 weeks, Disp: 14 capsule, Rfl: 0 .  tiZANidine (ZANAFLEX) 4 MG tablet, Take 1 tablet (4 mg total) by mouth every 6 (six) hours as needed for muscle spasms., Disp: 40 tablet, Rfl: 2 .  ENSTILAR 0.005-0.064 % FOAM, APPLY 1 APPLICATION ON THE SKIN TWICE A DAY, Disp: , Rfl: 3   Allergies  Allergen Reactions  . Cephalosporins Anaphylaxis  . Eggs Or Egg-Derived Products Hives and  Other (See Comments)    Angioedema  . Penicillins Anaphylaxis  . Sulfa Drugs Cross Reactors     Liver failure  . Effexor [Venlafaxine] Other (See Comments)    Lightheadedness, dizzy, headache   Review of Systems   Pertinent items are noted in the HPI. Otherwise, ROS is negative.  Vitals:   Vitals:   04/07/18 0723  BP: 126/84  Pulse: 74  Temp: 97.6 F (36.4 C)  TempSrc: Oral  SpO2: 99%  Weight: (!) 307 lb 12.8 oz (139.6 kg)  Height: 5' 9.5" (1.765 m)     Body mass index is 44.8 kg/m.  Physical Exam:   Physical Exam  Constitutional: She is oriented  to person, place, and time. She appears well-nourished. She appears lethargic.  HENT:  Head: Normocephalic and atraumatic.  Eyes: Pupils are equal, round, and reactive to light. EOM are normal.  Neck: Normal range of motion. Neck supple.  Cardiovascular: Normal rate, regular rhythm, normal heart sounds and intact distal pulses.  Pulmonary/Chest: Effort normal.  Abdominal: Soft.  Neurological: She is oriented to person, place, and time. She has normal strength. She appears lethargic. She displays normal reflexes.  Skin: Skin is warm.  Psychiatric: She has a normal mood and affect. Her behavior is normal.  Nursing note and vitals reviewed.   Assessment and Plan:   Arrow was seen today for weight loss.  Diagnoses and all orders for this visit:  Morbid obesity ([place]) Comments: Not ready to start Qsymia. Recent flu, then PNA, then fall with laceration and postconcussion syndrome. See going through PT. Will start when more stable.   Post concussion syndrome Comments: Slowly improving. Encouraged rest.    . Reviewed expectations re: course of current medical issues. . Discussed self-management of symptoms. . Outlined signs and symptoms indicating need for more acute intervention. . Patient verbalized understanding and all questions were answered. [facility] Health Maintenance issues including appropriate healthy diet, exercise, and smoking avoidance were discussed with patient. . See orders for this visit as documented in the electronic medical record. . Patient received an After Visit Summary.  Briscoe Deutscher, DO [place] [place] 04/07/2018  Future Appointments  Date Time Provider [place]  04/07/2018  8:45 AM HORSE PEN SUB THERAPIST A [facility]

## 2018-04-07 ENCOUNTER — Ambulatory Visit (INDEPENDENT_AMBULATORY_CARE_PROVIDER_SITE_OTHER): Payer: No Typology Code available for payment source | Admitting: Family Medicine

## 2018-04-07 ENCOUNTER — Ambulatory Visit (INDEPENDENT_AMBULATORY_CARE_PROVIDER_SITE_OTHER): Payer: No Typology Code available for payment source | Admitting: Physical Therapy

## 2018-04-07 ENCOUNTER — Encounter: Payer: Self-pay | Admitting: Family Medicine

## 2018-04-07 DIAGNOSIS — M6281 Muscle weakness (generalized): Secondary | ICD-10-CM | POA: Diagnosis not present

## 2018-04-07 DIAGNOSIS — G8929 Other chronic pain: Secondary | ICD-10-CM

## 2018-04-07 DIAGNOSIS — M545 Low back pain, unspecified: Secondary | ICD-10-CM

## 2018-04-07 DIAGNOSIS — F0781 Postconcussional syndrome: Secondary | ICD-10-CM

## 2018-04-07 NOTE — Therapy (Signed)
[facility] [address] [place], [place], [address] Phone: [phone]   Fax:  [phone]  Physical Therapy Treatment/Discharge  Patient Details  Name: Olivia [name] MRN: [id number] Date of Birth: 11-01-1966 Referring Provider: Briscoe Deutscher   Encounter Date: 04/07/2018  PT End of Session - 04/07/18 0921    Visit Number  13    Number of Visits  16    Date for PT Re-Evaluation  04/13/18    Authorization Type  [facility]     PT Start Time  0848    PT Stop Time  0910    PT Time Calculation (min)  22 min    Activity Tolerance  Patient tolerated treatment well    Behavior During Therapy  [facility] for tasks assessed/performed       Past Medical History:  Diagnosis Date  . Asthma   . Contact lens/glasses   . Environmental allergies   . Hiatal hernia   . HLD (hyperlipidemia) 04/15/2008  . Microcytic anemia   . Migraine, hormonal   . PACs (premature atrial contraction), s/p Cardiology evaluation   . PCOS (polycystic ovarian syndrome) 10/25/2014  . Pneumonia 3/13  . PONV (postoperative nausea and vomiting)   . Reflux   . Seasonal allergies     Past Surgical History:  Procedure Laterality Date  . HIATAL HERNIA REPAIR  04/14/2012   Procedure: LAPAROSCOPIC REPAIR OF HIATAL HERNIA;  Surgeon: Pedro Earls, MD;  Location: [facility];  Service: General;  Laterality: N/A;  Hiatal Hernia Repair-Replacement of Lap Band  . LAPAROSCOPIC GASTRIC BANDING  08/08/2008  . TONSILLECTOMY  1981    There were no vitals filed for this visit.  Subjective Assessment - 04/07/18 0915    Subjective  Pt states that she was nausiated after last session, vomited several times that day. She already had appt scheduled with Neurologist (for back) for the next day, so she discussed symptoms with him. She has been dx with post concussive syndrome. She had CT to rule out other pathology. She states significant light sensitivity, difficulty with concentration and memory,  as well as nausia with exertion. She will follow back up with that dr tomorrow. She states back has continued to feel better, reports no pain in back. She has been unable to do much strengthening or exertion, but has been able to continue stretches. She also has not been working due to current symptoms.      Currently in Pain?  No/denies    Pain Score  0-No pain                               PT Education - 04/07/18 0919    Education provided  Yes    Education Details  D/C plan, plan for progressive exercise, Info given on Neuro PT clinic to be seen for post concussion symptoms, Final HEP reviewed.     Person(s) Educated  Patient    Methods  Explanation    Comprehension  Verbalized understanding       PT Short Term Goals - 03/31/18 1133      PT SHORT TERM GOAL #1   Title  Pt to demo independence with initial HEp     Time  3    Period  Weeks    Status  Achieved      PT SHORT TERM GOAL #2   Title  Pt to demo ability for R SB without fear  Time  3    Period  Weeks    Status  Achieved        PT Long Term Goals - 04/07/18 8329      PT LONG TERM GOAL #1   Title  Pt to demo ability for full, painfree lumbar sidebending and rotation    Time  8    Period  Weeks    Status  Achieved      PT LONG TERM GOAL #2   Title  Pt to demo increased core and hip strength to at least 4+/5 to improve stability and pain     Time  8    Period  Weeks    Status  Achieved      PT LONG TERM GOAL #3   Title  Pt to demo independence with final HEP for long term strengthening and stabilization     Time  8    Period  Weeks    Status  Achieved      PT LONG TERM GOAL #4   Title  Pt to demo ability for proper squat mechanics and reaching mechanics, to improve ability and confidence with work duties     Baseline  Pt verbalizes understanding of demo with lifting technique, unable to perform due to concussion symptoms.     Time  8    Period  Weeks    Status  Achieved             Plan - 04/07/18 1916    Clinical Impression Statement  Pt unable to perform exercises, due to new symptoms of concussion, from fall at work. She was educated today on d/c plan, as well as final HEP. Discussed doing only activities that she is able to, and monitoring concussion symptoms. 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 Pt will follow up with Neurologist as well. Suggested Neuro PT clinic if pt needs treatement for these symptoms. Pt in agreement with recommendations and d/c plan Pt has met goals for PT, has no pain reported, much improved ROM, and improving strength and stability. Will benefit from continued strength and stability when able to resume exercises.     PT Treatment/Interventions  ADLs/Self Care Home Management;Cryotherapy;Electrical Stimulation;Moist Heat;Therapeutic activities;Iontophoresis 66m/ml Dexamethasone;Functional mobility training;Stair training;Gait training;Ultrasound;Therapeutic exercise;Neuromuscular re-education;Patient/family education;Orthotic Fit/Training;Dry needling;Passive range of motion;Manual techniques;Taping;Vasopneumatic Device       Patient will benefit from skilled therapeutic intervention in order to improve the following deficits and impairments:  Abnormal gait, Decreased endurance, Decreased activity tolerance, Decreased strength, Pain, Decreased mobility, Decreased range of motion, Hypermobility, Improper body mechanics  Visit Diagnosis: Chronic bilateral low back pain without sciatica  Muscle weakness (generalized)     Problem List Patient Active Problem List   Diagnosis Date Noted  . Vitamin D deficiency, currently on 50K IU x 12 weeks 01/2018 01/05/2018  . Psoriasis, clobetasol, referred to Dermatology 01/05/2018  . Chronic low back pain with sciatica 01/05/2018  . Hisotry of microcytic anemia, high risk after bariatric surgery 12/07/2017  . IUD (intrauterine device) in place, Liletta  12/07/2017  . Tension headache 10/04/2016  . PCOS, Metformin, IUD 10/25/2014  . Migraine with aura 01/11/2014  . [facility]  . HLD (hyperlipidemia), last checked 12/08/17, LDL 128, HDL 78 04/15/2008  . Morbid obesity ([place] 04/15/2008  . Allergic rhinitis, Zyrtec and Singulair 04/15/2008  . Mild intermittent asthma, albuterol prn 04/15/2008    LLyndee Hensen PT, DPT 9:26 AM  04/07/18   [facility] [address] [place], [place], [address] Phone: [phone]   Fax:  [phone]  Name: Olivia [name] MRN: [id number] Date of Birth: 1966/09/21    PHYSICAL THERAPY DISCHARGE SUMMARY  Visits from Start of Care:13  Plan: Patient agrees to discharge.  Patient goals were met. Patient is being discharged due to meeting the stated rehab goals.  ?????       Lyndee Hensen, PT, DPT 9:26 AM  04/07/18

## 2018-04-07 NOTE — Patient Instructions (Signed)
Supplements with evidence in postconcussion recovery include magnesium, tumeric, vitamin C, and B complex.

## 2018-05-31 ENCOUNTER — Other Ambulatory Visit: Payer: Self-pay

## 2018-05-31 ENCOUNTER — Emergency Department (HOSPITAL_BASED_OUTPATIENT_CLINIC_OR_DEPARTMENT_OTHER): Payer: No Typology Code available for payment source

## 2018-05-31 ENCOUNTER — Encounter (HOSPITAL_BASED_OUTPATIENT_CLINIC_OR_DEPARTMENT_OTHER): Payer: Self-pay | Admitting: Emergency Medicine

## 2018-05-31 ENCOUNTER — Emergency Department (HOSPITAL_BASED_OUTPATIENT_CLINIC_OR_DEPARTMENT_OTHER)
Admission: EM | Admit: 2018-05-31 | Discharge: 2018-05-31 | Disposition: A | Discharge status: Home / Self Care | Payer: No Typology Code available for payment source | Attending: Emergency Medicine | Admitting: Emergency Medicine

## 2018-05-31 DIAGNOSIS — J45909 Unspecified asthma, uncomplicated: Secondary | ICD-10-CM | POA: Diagnosis not present

## 2018-05-31 DIAGNOSIS — Z79899 Other long term (current) drug therapy: Secondary | ICD-10-CM | POA: Insufficient documentation

## 2018-05-31 DIAGNOSIS — Z9884 Bariatric surgery status: Secondary | ICD-10-CM | POA: Diagnosis not present

## 2018-05-31 DIAGNOSIS — R079 Chest pain, unspecified: Secondary | ICD-10-CM | POA: Diagnosis not present

## 2018-05-31 LAB — D-DIMER, QUANTITATIVE (NOT AT ARMC): D DIMER QUANT: 0.4 ug{FEU}/mL (ref 0.00–0.50)

## 2018-05-31 LAB — CBC WITH DIFFERENTIAL/PLATELET
BASOS ABS: 0 10*3/uL (ref 0.0–0.1)
BASOS PCT: 1 %
EOS PCT: 1 %
Eosinophils Absolute: 0.1 10*3/uL (ref 0.0–0.7)
HCT: 45.4 % (ref 36.0–46.0)
Hemoglobin: 15.4 g/dL — ABNORMAL HIGH (ref 12.0–15.0)
LYMPHS ABS: 2.9 10*3/uL (ref 0.7–4.0)
Lymphocytes Relative: 35 %
MCH: 28.6 pg (ref 26.0–34.0)
MCHC: 33.9 g/dL (ref 30.0–36.0)
MCV: 84.4 fL (ref 78.0–100.0)
Monocytes Absolute: 0.9 10*3/uL (ref 0.1–1.0)
Monocytes Relative: 11 %
NEUTROS PCT: 52 %
Neutro Abs: 4.4 10*3/uL (ref 1.7–7.7)
PLATELETS: 280 10*3/uL (ref 150–400)
RBC: 5.38 MIL/uL — AB (ref 3.87–5.11)
RDW: 13.3 % (ref 11.5–15.5)
WBC: 8.3 10*3/uL (ref 4.0–10.5)

## 2018-05-31 LAB — BASIC METABOLIC PANEL
Anion gap: 11 (ref 5–15)
BUN: 12 mg/dL (ref 6–20)
CO2: 24 mmol/L (ref 22–32)
Calcium: 10.3 mg/dL (ref 8.9–10.3)
Chloride: 105 mmol/L (ref 98–111)
Creatinine, Ser: 0.85 mg/dL (ref 0.44–1.00)
GFR calc Af Amer: >60 mL/min (ref >60)
GFR calc non Af Amer: >60 mL/min (ref >60)
GLUCOSE: 93 mg/dL (ref 70–99)
Potassium: 3.4 mmol/L — ABNORMAL LOW (ref 3.5–5.1)
SODIUM: 140 mmol/L (ref 135–145)

## 2018-05-31 LAB — TROPONIN I

## 2018-05-31 LAB — MAGNESIUM: MAGNESIUM: 1.9 mg/dL (ref 1.7–2.4)

## 2018-05-31 NOTE — Progress Notes (Signed)
Cardiology Moonlighter Note  Contacted by [facility] regarding this patient, who presents with palpitations and was found to have intermittent atrial and ventricular ectopy on telemetry.  On reviewing the patient's chart, it appears as though something similar occurred to this patient in the past, however was previously attributed to her caffeine intake.  Currently, the patient is asymptomatic until she has intermittent episodes of APCs and short episodes of ventricular bigeminy.  She has no exertional symptoms consistent with cardiac ischemia and her troponins have been negative x2.  Her BNP is further negative and I am told that she has no physical exam evidence of heart failure.  The patient has had no syncope or presyncope.  She has no personal or family history of sudden cardiac death.  Her ECG is otherwise reassuring with no other evidence of cardiac ischemia or features of high risk channelopathy.  Given the patient's clinical stability I recommended to the ED provider that we pursue work-up in the outpatient setting as long as the patient is amenable to this plan.  This was discussed with the patient and she agreed.  A copy of this note will be sent to the on-call cardiology attending, Dr. Radford Pax, for help facilitating outpatient follow-up.  The patient understands that she should return to the ED should she have any new or worsening symptoms.  Marcie Mowers, MD Cardiology Fellow, PGY-6

## 2018-05-31 NOTE — ED Notes (Signed)
Pt insisted on using restroom prior to EKG

## 2018-05-31 NOTE — ED Notes (Signed)
Ambulated patient around department.  Patient stated she felt fine while walking but upon sitting back in bed she began to feel her heart beat becoming irregular again and a chest pressure.  EDP made aware.

## 2018-05-31 NOTE — ED Notes (Signed)
Patient given water for PO challenge per EDP

## 2018-05-31 NOTE — ED Triage Notes (Signed)
Reports chest heaviness which began around 1300 yesterday.  States she thought this was related to asthma and so she didn't pursue this further and went to bed last night.  Reports she felt better this morning but upon going to the gym she began feeling worse with exertion.  Reports she listened to her heart rhythm with her stethoscope and heard "s3 and s4".  Reports radial pulse does not match heart rhythm.

## 2018-05-31 NOTE — ED Provider Notes (Signed)
[place] EMERGENCY DEPARTMENT Provider Note   CSN: [id number] Arrival date & time: 05/31/18  1145     History   Chief Complaint Chief Complaint  Patient presents with  . Chest Pain    HPI Olivia [name] is a 52 y.o. female.  52 year old female with a past medical history of PACs, HLD presents to the ED complaining of chest heaviness, shortness of breath since yesterday.  Patient states that she was laying down when she noticed her heart was palpitating fast.She then took her stethoscope and stated she heard a "gallop", which is she had not heard before. Patient describes her chest pain as constant, worse with expiration and better with rest. Patient states the palpitations had subsided but the chest pressure persisted, she states her pain is not reproducible with palpation. Resting does alleviated some of the pressure. Patient denies any cardiac history or family cardiac history at this time. She does not smoke, no previous DVT or PE. She also denies any fever, abdominal pain or other complaints.   Patient is currently not on oral contraceptives but does report being on them for a decade. She currently had an IUD.      Past Medical History:  Diagnosis Date  . Asthma   . Contact lens/glasses   . Environmental allergies   . Hiatal hernia   . HLD (hyperlipidemia) 04/15/2008  . Microcytic anemia   . Migraine, hormonal   . PACs (premature atrial contraction), s/p Cardiology evaluation   . PCOS (polycystic ovarian syndrome) 10/25/2014  . Pneumonia 3/13  . PONV (postoperative nausea and vomiting)   . Reflux   . Seasonal allergies     Patient Active Problem List   Diagnosis Date Noted  . Vitamin D deficiency, currently on 50K IU x 12 weeks 01/2018 01/05/2018  . Psoriasis, clobetasol, referred to Dermatology 01/05/2018  . Chronic low back pain with sciatica 01/05/2018  . Hisotry of microcytic anemia, high risk after bariatric surgery 12/07/2017  . IUD  (intrauterine device) in place, Liletta 12/07/2017  . Tension headache 10/04/2016  . PCOS, Metformin, IUD 10/25/2014  . Migraine with aura 01/11/2014  . Lapband APS resited with [facility] repair June 2013 05/08/2012  . HLD (hyperlipidemia), last checked 12/08/17, LDL 128, HDL 78 04/15/2008  . Morbid obesity ([place]) 04/15/2008  . Allergic rhinitis, Zyrtec and Singulair 04/15/2008  . Mild intermittent asthma, albuterol prn 04/15/2008    Past Surgical History:  Procedure Laterality Date  . HIATAL HERNIA REPAIR  04/14/2012   Procedure: LAPAROSCOPIC REPAIR OF HIATAL HERNIA;  Surgeon: Pedro Earls, MD;  Location: [facility];  Service: General;  Laterality: N/A;  Hiatal Hernia Repair-Replacement of Lap Band  . LAPAROSCOPIC GASTRIC BANDING  08/08/2008  . TONSILLECTOMY  1981     OB History   None      Home Medications    Prior to Admission medications   Medication Sig Start Date End Date Taking? Authorizing Provider  albuterol (PROVENTIL) (2.5 MG/3ML) 0.083% nebulizer solution Take 3 mLs (2.5 mg total) by nebulization every 6 (six) hours as needed for wheezing or shortness of breath. 02/10/18   Briscoe Deutscher, DO  beclomethasone (QVAR REDIHALER) 80 MCG/ACT inhaler Inhale 1 puff into the lungs 2 (two) times daily. 02/07/18   Midge Minium, MD  cetirizine (ZYRTEC) 10 MG tablet Take 10 mg by mouth at bedtime.     [provider]  Cholecalciferol (VITAMIN D) 2000 units CAPS Take 1 capsule by mouth daily.    [provider]  ENSTILAR 0.005-0.064 % FOAM APPLY 1 APPLICATION ON THE SKIN TWICE A DAY 03/06/18   [provider]  levonorgestrel (LILETTA, 52 MG,) 18.6 MCG/DAY IUD IUD 1 each by Intrauterine route once.    [provider]  metFORMIN (GLUCOPHAGE-XR) 500 MG 24 hr tablet Take 2 tablets (1,000 mg total) by mouth at bedtime. 03/26/18   Briscoe Deutscher, DO  montelukast (SINGULAIR) 10 MG tablet Take 1 tablet (10 mg total) by mouth at bedtime. 03/26/18   Briscoe Deutscher, DO    Multiple Vitamin (MULITIVITAMIN WITH MINERALS) TABS Take 1 tablet by mouth daily.    [provider]  Phentermine-Topiramate 3.75-23 MG CP24 One po daily x 2 weeks 01/05/18   Briscoe Deutscher, DO  tiZANidine (ZANAFLEX) 4 MG tablet Take 1 tablet (4 mg total) by mouth every 6 (six) hours as needed for muscle spasms. 06/20/17   Paticia Stack, PA-C    Family History Family History  Problem Relation Age of Onset  . Diabetes Mother   . Obesity Mother   . Colon polyps Mother   . Colon cancer Mother   . Obesity Father   . Hypertension Father   . Obesity Sister     Social History Social History   Tobacco Use  . Smoking status: Never Smoker  . Smokeless tobacco: Never Used  Substance Use Topics  . Alcohol use: No  . Drug use: No     Allergies   Cephalosporins; Eggs or egg-derived products; Penicillins; Sulfa drugs cross reactors; and Effexor [venlafaxine]   Review of Systems Review of Systems  Constitutional: Negative for chills and fever.  HENT: Negative for sore throat.   Eyes: Negative for pain and visual disturbance.  Respiratory: Positive for chest tightness and shortness of breath (worst with expiration). Negative for cough.   Cardiovascular: Positive for chest pain. Negative for palpitations.  Gastrointestinal: Negative for abdominal pain, diarrhea, nausea and vomiting.  Genitourinary: Negative for dysuria, flank pain and hematuria.  Musculoskeletal: Negative for arthralgias and back pain.  Skin: Negative for color change and rash.  Neurological: Negative for seizures and syncope.  All other systems reviewed and are negative.    Physical Exam Updated Vital Signs BP 112/74   Pulse 86   Temp 97.9 F (36.6 C) (Oral)   Resp 18   Ht 5\' 9"  (1.753 m)   Wt (!) 138.3 kg (305 lb)   SpO2 99%   BMI 45.04 kg/m   Physical Exam  Constitutional: She is oriented to person, place, and time. She appears well-developed and well-nourished. No distress.  HENT:   Head: Normocephalic and atraumatic.  Mouth/Throat: Oropharynx is clear and moist. No oropharyngeal exudate.  Eyes: Pupils are equal, round, and reactive to light.  Neck: Normal range of motion.  Cardiovascular: Regular rhythm and normal heart sounds. Exam reveals no gallop, no S3 and no S4.  Pulmonary/Chest: Effort normal and breath sounds normal. No respiratory distress. She has no decreased breath sounds. She has no rales.  Abdominal: Soft. Bowel sounds are normal. She exhibits no distension. There is no tenderness.  Musculoskeletal: She exhibits no tenderness or deformity.       Right lower leg: She exhibits no tenderness and no edema.       Left lower leg: She exhibits no tenderness and no edema.  Neurological: She is alert and oriented to person, place, and time.  Skin: Skin is warm and dry. Capillary refill takes less than 2 seconds.  Psychiatric: She has a  normal mood and affect.  Nursing note and vitals reviewed.    ED Treatments / Results  Labs (all labs ordered are listed, but only abnormal results are displayed) Labs Reviewed  CBC WITH DIFFERENTIAL/PLATELET - Abnormal; Notable for the following components:      Result Value   RBC 5.38 (*)    Hemoglobin 15.4 (*)    All other components within normal limits  BASIC METABOLIC PANEL - Abnormal; Notable for the following components:   Potassium 3.4 (*)    All other components within normal limits  D-DIMER, QUANTITATIVE (NOT AT [facility])  MAGNESIUM  TROPONIN I  TROPONIN I    EKG EKG Interpretation  Date/Time:  Sunday May 31 2018 12:53:24 EDT Ventricular Rate:  77 PR Interval:    QRS Duration: 96 QT Interval:  408 QTC Calculation: 411 R Axis:   -12 Text Interpretation:  Sinus rhythm Multiple premature complexes, vent & supraven Minimal ST depression, inferior leads When compared to prior, new bigemiy.  No STEMI Confirmed by Antony Blackbird [phone]) on 05/31/2018 2:39:52 PM   Radiology Dg Chest 2 View  Result Date:  05/31/2018 CLINICAL DATA:  Chest heaviness and pain beginning 1 o'clock yesterday. Increased pain with exertion today. EXAM: CHEST - 2 VIEW COMPARISON:  Two-view chest x-ray 02/10/2018 FINDINGS: Heart size is normal. Ill-defined medial right lower lobe airspace disease is present. There is no edema or effusion. Dextroconvex curvature of the thoracic spine is stable. The visualized soft tissues and bony thorax are otherwise unremarkable. IMPRESSION: 1. Ill-defined medial right lower lobe airspace disease concerning for developing pneumonia. 2. Stable curvature of the thoracic spine. Electronically Signed   By: San Morelle M.D.   On: 05/31/2018 14:28    Procedures Procedures (including critical care time)  Medications Ordered in ED Medications - No data to display   Initial Impression / Assessment and Plan / ED Course  I have reviewed the triage vital signs and the nursing notes.  Pertinent labs & imaging results that were available during my care of the patient were reviewed by me and considered in my medical decision making (see chart for details).    Patient's laboratory results show no leukocytosis, electrolyte abnormality except slight decrease in potassium at 3.4.Patient first EKG showed no changes from prior. However, a second EKG was obtained by staff after patient used the restroom, her EKG showed patient going into Bigeminy rhythm and HR to ~50's. Troponin first set was negative, will schedule another set. Patient was concerned about her shortness of breath with exertion, D dimer was negative this making a PE less likely.   2:48 PM chest x-ray showed heel right lower airspace disease concerning for developing pneumonia.  Dr. Sherry Ruffing and myself have spoken to the patient while placing her on antibiotics to treat the pneumonia, patient at this time states that she would not like antibiotic treatment as she is not having any symptoms regarding the pneumonia.  She is more concerned  about her abnormal rhythm at this time we will wait on second troponin  4:23 PM Patient experienced some heaviness with exertion when going to the restroom at this time I feel is best for further workup and Cardiology consult. Patients second troponin is negative. I have talked to patient and explained that with this dysthymic changes we will place a consult and possibly admit her.   5:40 PM Spoke to Cardiology fellow on call Dr. Alfonse Ras who suggest this patient can have appropriate workup done outpatient at this time.  I made him aware that her workup in the ED has been negative except for her chest xray showing the possible pneumonia. He states he will place a note on her chart for cardiology to schedule an appointment her this week.  I have spoken to patient at this time who states she will call cardiology tomorrow morning to schedule an appointment.  She understands that she may return if her symptoms worsen.  Patient states that she will go to [facility] if symptoms worsen.  At this time patient understands and agrees with plan.  Vitals are stable.  I have discussed this patient with Dr. Sherry Ruffing and Dr. Wilson Singer.    Final Clinical Impressions(s) / ED Diagnoses   Final diagnoses:  Nonspecific chest pain    ED Discharge Orders    None       Janeece Fitting, PA-C 05/31/18 Pindall, Gwenyth Allegra, MD 06/01/18 1158

## 2018-05-31 NOTE — Discharge Instructions (Signed)
Please schedule appointment with cardiology within 1 week. Please return to the ED if your symptoms worsen or you experience any chest pain, or shortness of breath.

## 2018-06-01 ENCOUNTER — Other Ambulatory Visit: Payer: Self-pay

## 2018-06-01 ENCOUNTER — Telehealth: Payer: Self-pay | Admitting: Family Medicine

## 2018-06-01 ENCOUNTER — Encounter (HOSPITAL_COMMUNITY): Payer: Self-pay | Admitting: *Deleted

## 2018-06-01 ENCOUNTER — Other Ambulatory Visit: Payer: Self-pay | Admitting: Physician Assistant

## 2018-06-01 ENCOUNTER — Emergency Department (HOSPITAL_COMMUNITY)
Admission: EM | Admit: 2018-06-01 | Discharge: 2018-06-01 | Disposition: A | Discharge status: Home / Self Care | Payer: No Typology Code available for payment source | Attending: Emergency Medicine | Admitting: Emergency Medicine

## 2018-06-01 DIAGNOSIS — J45909 Unspecified asthma, uncomplicated: Secondary | ICD-10-CM | POA: Insufficient documentation

## 2018-06-01 DIAGNOSIS — R072 Precordial pain: Secondary | ICD-10-CM | POA: Insufficient documentation

## 2018-06-01 DIAGNOSIS — I499 Cardiac arrhythmia, unspecified: Secondary | ICD-10-CM

## 2018-06-01 DIAGNOSIS — Z79899 Other long term (current) drug therapy: Secondary | ICD-10-CM | POA: Insufficient documentation

## 2018-06-01 DIAGNOSIS — R079 Chest pain, unspecified: Secondary | ICD-10-CM | POA: Diagnosis present

## 2018-06-01 DIAGNOSIS — R002 Palpitations: Secondary | ICD-10-CM

## 2018-06-01 LAB — BASIC METABOLIC PANEL
Anion gap: 9 (ref 5–15)
BUN: 11 mg/dL (ref 6–20)
CALCIUM: 10.6 mg/dL — AB (ref 8.9–10.3)
CO2: 24 mmol/L (ref 22–32)
CREATININE: 0.77 mg/dL (ref 0.44–1.00)
Chloride: 108 mmol/L (ref 98–111)
GFR calc Af Amer: >60 mL/min (ref >60)
GLUCOSE: 126 mg/dL — AB (ref 70–99)
Potassium: 3.8 mmol/L (ref 3.5–5.1)
Sodium: 141 mmol/L (ref 135–145)

## 2018-06-01 LAB — I-STAT TROPONIN, ED: TROPONIN I, POC: 0 ng/mL (ref 0.00–0.08)

## 2018-06-01 LAB — I-STAT BETA HCG BLOOD, ED (MC, WL, AP ONLY): I-stat hCG, quantitative: <5 m[IU]/mL (ref <5)

## 2018-06-01 LAB — CBC
HCT: 46.4 % — ABNORMAL HIGH (ref 36.0–46.0)
Hemoglobin: 15.1 g/dL — ABNORMAL HIGH (ref 12.0–15.0)
MCH: 28.4 pg (ref 26.0–34.0)
MCHC: 32.5 g/dL (ref 30.0–36.0)
MCV: 87.2 fL (ref 78.0–100.0)
Platelets: 286 10*3/uL (ref 150–400)
RBC: 5.32 MIL/uL — ABNORMAL HIGH (ref 3.87–5.11)
RDW: 13 % (ref 11.5–15.5)
WBC: 8.5 10*3/uL (ref 4.0–10.5)

## 2018-06-01 LAB — TSH: TSH: 0.935 u[IU]/mL (ref 0.350–4.500)

## 2018-06-01 MED ORDER — METOPROLOL TARTRATE 25 MG PO TABS
12.5000 mg | ORAL_TABLET | Freq: Once | ORAL | Status: AC
  Administered 2018-06-01: 12.5 mg via ORAL
  Filled 2018-06-01: qty 1

## 2018-06-01 MED ORDER — METOPROLOL TARTRATE 25 MG PO TABS: 12.5000 mg | ORAL_TABLET | Freq: Two times a day (BID) | ORAL | 0 refills | Status: DC

## 2018-06-01 NOTE — ED Provider Notes (Signed)
[place] EMERGENCY DEPARTMENT Provider Note   CSN: [id number] Arrival date & time: 06/01/18  1408      History   Chief Complaint Chief Complaint  Patient presents with  . Chest Pain    HPI HPI Olivia [name] is a 52 y.o. female with asthma, hyperlipidemia, and recent pneumonia who presents due to 2 days of intermittent palpitations with associated chest pain.  She says that she was originally seen at [facility] yesterday due to palpitations.  She said that she was found to be in bigeminy.  Yesterday, she says that she thought her palpitations were due to her taking her albuterol inhaler after exercising.  Her symptoms resolved last night.  This morning, she began having chest pain and thought that she was having palpitations again.  This occurred while at rest.  It was not exacerbated with activity and it was not relieved by rest.  She said that she had sharp left-sided chest pain.  She felt mildly nauseous with this.  The pain radiated down her left arm and also into her left jaw.  She is concerned that she may be having a heart attack.  She reports having an unchanged amount of caffeine usage recently.  She is also worried about her thyroid.  She says that shortly after arriving at the ED that she began feeling better.  She does not think she is currently in bigeminy. Past Medical History:  Diagnosis Date  . Asthma   . Environmental allergies   . Hiatal hernia   . HLD (hyperlipidemia) 04/15/2008  . Microcytic anemia   . Migraine, hormonal   . PACs (premature atrial contraction)   . PCOS (polycystic ovarian syndrome) 10/25/2014  . Pneumonia 3/13  . PONV (postoperative nausea and vomiting)   . Reflux   . Seasonal allergies     Patient Active Problem List   Diagnosis Date Noted  . Vitamin D deficiency, currently on 50K IU x 12 weeks 01/2018 01/05/2018  . Psoriasis, clobetasol, referred to Dermatology 01/05/2018  . Chronic low back pain with  sciatica 01/05/2018  . Hisotry of microcytic anemia, high risk after bariatric surgery 12/07/2017  . IUD (intrauterine device) in place, Liletta 12/07/2017  . Tension headache 10/04/2016  . PCOS, Metformin, IUD 10/25/2014  . Migraine with aura 01/11/2014  . Lapband APS resited with [facility] repair June 2013 05/08/2012  . HLD (hyperlipidemia), last checked 12/08/17, LDL 128, HDL 78 04/15/2008  . Morbid obesity ([place]) 04/15/2008  . Allergic rhinitis, Zyrtec and Singulair 04/15/2008  . Mild intermittent asthma, albuterol prn 04/15/2008    Past Surgical History:  Procedure Laterality Date  . HIATAL HERNIA REPAIR  04/14/2012   Procedure: LAPAROSCOPIC REPAIR OF HIATAL HERNIA;  Surgeon: Pedro Earls, MD;  Location: [facility];  Service: General;  Laterality: N/A;  Hiatal Hernia Repair-Replacement of Lap Band  . LAPAROSCOPIC GASTRIC BANDING  08/08/2008  . TONSILLECTOMY  1981     OB History   None      Home Medications    Prior to Admission medications   Medication Sig Start Date End Date Taking? Authorizing Provider  albuterol (PROVENTIL HFA) 108 (90 Base) MCG/ACT inhaler Inhale 2 puffs into the lungs every 6 (six) hours as needed for wheezing or shortness of breath.   Yes [provider]  albuterol (PROVENTIL) (2.5 MG/3ML) 0.083% nebulizer solution Take 3 mLs (2.5 mg total) by nebulization every 6 (six) hours as needed for wheezing or shortness of breath. 02/10/18  Yes Briscoe Deutscher, DO  beclomethasone (QVAR REDIHALER) 80 MCG/ACT inhaler Inhale 1 puff into the lungs 2 (two) times daily. Patient taking differently: Inhale 1 puff into the lungs 2 (two) times daily as needed (for seasonal flares).  02/07/18  Yes Midge Minium, MD  Calcium Carb-Cholecalciferol (CALCIUM+D3 PO) Take 1 tablet by mouth 3 (three) times a week.   Yes [provider]  cetirizine (ZYRTEC) 10 MG tablet Take 10 mg by mouth at bedtime.    Yes [provider]  ENSTILAR 0.005-0.064 % FOAM APPLY 1  APPLICATION ON THE SKIN TWICE A DAY 03/06/18  Yes [provider]  levonorgestrel (LILETTA, 52 MG,) 18.6 MCG/DAY IUD IUD 1 each by Intrauterine route once.   Yes [provider]  metFORMIN (GLUCOPHAGE-XR) 500 MG 24 hr tablet Take 2 tablets (1,000 mg total) by mouth at bedtime. 03/26/18  Yes Briscoe Deutscher, DO  montelukast (SINGULAIR) 10 MG tablet Take 1 tablet (10 mg total) by mouth at bedtime. 03/26/18  Yes Briscoe Deutscher, DO  Multiple Vitamin (MULITIVITAMIN WITH MINERALS) TABS Take 1 tablet by mouth daily.   Yes [provider]  TACLONEX external suspension Apply 1 application topically See admin instructions. Apply 1 application to the skin two times a day to psoriasis-affected areas 04/09/18  Yes [provider]  tiZANidine (ZANAFLEX) 4 MG tablet Take 1 tablet (4 mg total) by mouth every 6 (six) hours as needed for muscle spasms. 06/20/17  Yes Teague Bobbye Morton, PA-C  metoprolol tartrate (LOPRESSOR) 25 MG tablet Take 0.5 tablets (12.5 mg total) by mouth 2 (two) times daily. 06/01/18   Jola Schmidt, MD  Phentermine-Topiramate 3.75-23 MG CP24 One po daily x 2 weeks Patient not taking: Reported on 06/01/2018 01/05/18   Briscoe Deutscher, DO    Family History Family History  Problem Relation Age of Onset  . Diabetes Mother   . Obesity Mother   . Colon polyps Mother   . Colon cancer Mother   . Obesity Father   . Hypertension Father   . Obesity Sister     Social History Social History   Tobacco Use  . Smoking status: Never Smoker  . Smokeless tobacco: Never Used  Substance Use Topics  . Alcohol use: No  . Drug use: No     Allergies   Cephalosporins; Eggs or egg-derived products; Penicillins; Sulfa drugs cross reactors; Adhesive [tape]; and Effexor [venlafaxine]   Review of Systems Review of Systems Review of Systems   Constitutional  Negative for fever  Negative for chills  HENT  Negative for ear pain  Negative for sore throat  Negative for  difficultly swallowing  Eyes  Negative for eye pain  Negative for visual disturbance  Respiratory  Negative for shortness of breath  Negative for cough  CV  +for chest pain  Negative for leg swelling  Abdomen  Negative for abdominal pain  Negative for nausea  Negative for vomiting  MSK  Negative for extremity pain  Negative for back pain  Skin  Negative for rash  Negative for wound  Neuro  Negative for syncope  Negative for difficultly speaking  Psych  Negative for confusion   The remainder of the ROS was reviewed and negative except as documented above.      Physical Exam Updated Vital Signs BP 138/89   Pulse 70   Temp 98.1 F (36.7 C) (Oral)   Resp 18   Ht 5\' 10"  (1.778 m)   Wt (!) 138.3 kg (305 lb)  SpO2 96%   BMI 43.76 kg/m   Physical Exam Physical Exam Constitutional  Nursing notes reviewed  Vital signs reviewed  HEENT  No obvious trauma  Supple without meningismus, mass, or overt JVD  EOMI  No scleral icterus or injection  Respiratory  Effort normal  CTAB  No respiratory distress  CV  Normal rate  No obvious murmurs  No pitting edema  Equal pulses in all extremities  Chest not tender to palpation  Abdomen  Soft  Non-tender  Non-distended  No peritonitis  MSK  Atraumatic  No obvious deformity  ROM appropriate  Skin  Warm  Dry  Neuro  Awake and alert  EOMI  Moving all extremities  Denies numbness/tingling  Psychiatric  Mood and affect normal        ED Treatments / Results  Labs (all labs ordered are listed, but only abnormal results are displayed) Labs Reviewed  BASIC METABOLIC PANEL - Abnormal; Notable for the following components:      Result Value   Glucose, Bld 126 (*)    Calcium 10.6 (*)    All other components within normal limits  CBC - Abnormal; Notable for the following components:   RBC 5.32 (*)    Hemoglobin 15.1 (*)    HCT 46.4 (*)    All other components within normal  limits  TSH  I-STAT TROPONIN, ED  I-STAT BETA HCG BLOOD, ED ([facility], [facility], [facility] ONLY)    EKG None The ECG revealed:   A sinus rhythm with a ventricular rate of 84.  Occasional PACs present  QTc 411, PR 138, QRS 80  No STEMI  No ST depressions  No other acute ischemic changes  There is no evidence of:   High-Grade Conduction Blocks   WPW   Long QT Syndrome   Significant LVH   Brugada Syndrome   Arrhythmogenic Right Ventricular Dysplasia   Wellens Waves   DeWinters T Waves   Right heart strain.    Radiology Dg Chest 2 View  Result Date: 05/31/2018 CLINICAL DATA:  Chest heaviness and pain beginning 1 o'clock yesterday. Increased pain with exertion today. EXAM: CHEST - 2 VIEW COMPARISON:  Two-view chest x-ray 02/10/2018 FINDINGS: Heart size is normal. Ill-defined medial right lower lobe airspace disease is present. There is no edema or effusion. Dextroconvex curvature of the thoracic spine is stable. The visualized soft tissues and bony thorax are otherwise unremarkable. IMPRESSION: 1. Ill-defined medial right lower lobe airspace disease concerning for developing pneumonia. 2. Stable curvature of the thoracic spine. Electronically Signed   By: San Morelle M.D.   On: 05/31/2018 14:28    Procedures Procedures (including critical care time)  Medications Ordered in ED Medications  metoprolol tartrate (LOPRESSOR) tablet 12.5 mg (12.5 mg Oral Given 06/01/18 1726)     Initial Impression / Assessment and Plan / ED Course  I have reviewed the triage vital signs and the nursing notes.  Pertinent labs & imaging results that were available during my care of the patient were reviewed by me and considered in my medical decision making (see chart for details).    [name] presents with chest pain as per above.  The ECG reveals no anatomical ischemia representing STEMI or ischemic equivalent. To further evaluate for ongoing myocardial ischemia, troponin will be ordered. Her  troponin is not elevated.  Given her presentation and similar presentation yesterday, I think that one troponin is adequate for ACS risk stratification.  The patient's presentation, the patient being hemodynamically  stable, and the ECG are not consistent with Pericardial Tamponade. The patient's pain is not positional. This in conjunction with the lack of PR depressions and ST elevations on the ECG are reassuring against Pericarditis. The patient's non-elevated troponin and ECG are also inconsistent with Myocarditis.  The CXR yesterday shows possible pneumonia.  The patient says that she was treated for PNA in March.  She denies fever and cough.  I do not think that she has active Pneumonia. There is no evidence of Pneumothorax on physical exam or on the CXR. CXR shows no evidence of Esophageal Tear and there is no recent intractable emesis or esophageal instrumentation. There is no peritonitis or free air on CXR worrisome for a Perforated Abdominal Viscous.  I do not think that the patient has a Pulmonary Embolism. She denies pleuritic pain and is neither tachypneic nor tachycardic.  D dimer yesterday was not elevated.  The patient's pain is not described as tearing and it does not radiate to back. Pulses are present bilaterally in both the upper and lower extremities. CXR does not show a widened mediastinum. I have a very low suspicion for Aortic Dissection.  I suspect that she is most likely suffering from an intermittent dysrhythmia such as bigeminy.  Due to this being her second presentation within 2 days with concern for worsening symptoms, cardiology was consulted.  They evaluated her in the ED.  They believe that she is safe for outpatient follow-up.  They initiated her on Lopressor in the ED.  She will see cardiology outpatient.  Her TSH was normal.  Her pregnancy test was negative.  She had no significant abnormalities on CBC or BMP.  I believe that the patient is safe to discharge home. We  have discussed the diagnosis and risks, and we agree with discharging home to follow-up with Cardiology. We also discussed returning to the Emergency Department immediately if new or worsening symptoms occur. We have discussed the symptoms which are most concerning (e.g., bloody sputum, fever, worsening pain or shortness of breath, vomiting) that necessitate immediate return.   Final Clinical Impressions(s) / ED Diagnoses   Final diagnoses:  Precordial chest pain  Palpitations    ED Discharge Orders        Ordered    metoprolol tartrate (LOPRESSOR) 25 MG tablet  2 times daily     06/01/18 1712       Alford Highland, MD 06/02/18 Nolberto Hanlon    Jola Schmidt, MD 06/02/18 [phone]

## 2018-06-01 NOTE — Telephone Encounter (Signed)
Order put in stat called patient husband answered phone she is in ED will call when she is discharged

## 2018-06-01 NOTE — Telephone Encounter (Signed)
Okay referral. Call to speak with patient and see how she is feeling. I believe this needs to be an urgent referral. 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

## 2018-06-01 NOTE — ED Triage Notes (Signed)
Pt in c/o chest pain that started about an hour ago, reports she had a similar episode Saturday with chest pressure and palpitations. Pt seen for same yesterday at [facility], pt was in bigeminy at that time, they wanted to admit her at that time but cardio cleared for patient to be followed outpatient, pain returned this morning. Pain is radiating into her left arm and hand, also shoulder pain and jaw pain

## 2018-06-01 NOTE — Telephone Encounter (Signed)
Ok to place referral or do we need to make office visit?

## 2018-06-01 NOTE — ED Provider Notes (Signed)
Patient placed in Quick Look pathway, seen and evaluated   Chief Complaint: Chest pain and palpitations  HPI:   Patient presents for evaluation of worsening chest pain and palpitations, was evaluated yesterday and was found to be in bigeminy, but this resolved with rest, today palpitations and chest pressure returned and are not resolving but with rest.  Yesterday was cleared to be followed up outpatient with cardiology, but returned due to worsening symptoms.  ROS:   Physical Exam:   Gen: No distress  Neuro: Awake and Alert  Skin: Warm    Focused Exam: Heart with regular rate and rhythm and lungs clear to auscultation at this time.   Initiation of care has begun. The patient has been counseled on the process, plan, and necessity for staying for the completion/evaluation, and the remainder of the medical screening examination    Janet Berlin 06/01/18 1438    Duffy Bruce, MD 06/01/18 1654

## 2018-06-01 NOTE — Consult Note (Addendum)
Cardiology Consultation:   Patient ID: Olivia [name]; [id number]; 01-21-1966   Admit date: 06/01/2018 Date of Consult: 06/01/2018  Primary Care Provider: Briscoe Deutscher, DO Primary Cardiologist: New to Dr. Percival Spanish   Patient Profile:   Olivia [name] is a 52 y.o. female with a hx of hyperlipidemia, asthma, hiatal hernia, DM, recent pneumonia, thyroid nodules and PAC who is being seen today for the evaluation of palpitation at the request of Dr. Venora Maples.  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  She has a long-standing history of palpitation which she attributed to stress.  No loss of consciousness. She has long-standing history of thyroid nodule with fluctuating TSH.  Not on any supplement.  History of Present Illness:   Olivia [name] presented for evaluation of palpitations.  This been chronic however, worsening in past 2 days.  Patient had a palpitation on Saturday lasting for 5 hours after her exercise routine.  She also noted intermittent shortness of breath associated with chest heaviness.  She does personal training 2 times per week and few days of cardiac exercise.  Might have recently noted some exertional limitation.  Sunday patient had a recurrent palpitation leading to ER evaluation.  She was noted in PACs otherwise reassuring work-up.  Discharged with outpatient cardiology follow-up plan.  However patient had a recurrent palpitation and chest tightness at rest this afternoon.  Her tightness radiated to her left arm.  Associated with shortness of breath.  Due to ongoing symptoms he came to ER for further evaluation.  Denies tobacco abuse or alcohol usage.  Illicit drug use.  Father has history of irregular heart rate but no A. fib or a flutter.  Avoids caffeine.  No fever, chills, cough, congestion, orthopnea, PND, syncope, dizziness or lower extremity edema.  EKG shows sinus rhythm with nonspecific T wave inversion inferiorly-personally reviewed.  Troponin negative.   Electrolyte and serum creatinine are normal.  Checks x-ray yesterday showed developing pneumonia.  Past Medical History:  Diagnosis Date  . Asthma   . Contact lens/glasses   . Environmental allergies   . Hiatal hernia   . HLD (hyperlipidemia) 04/15/2008  . Microcytic anemia   . Migraine, hormonal   . PACs (premature atrial contraction), s/p Cardiology evaluation   . PCOS (polycystic ovarian syndrome) 10/25/2014  . Pneumonia 3/13  . PONV (postoperative nausea and vomiting)   . Reflux   . Seasonal allergies     Past Surgical History:  Procedure Laterality Date  . HIATAL HERNIA REPAIR  04/14/2012   Procedure: LAPAROSCOPIC REPAIR OF HIATAL HERNIA;  Surgeon: Pedro Earls, MD;  Location: [facility];  Service: General;  Laterality: N/A;  Hiatal Hernia Repair-Replacement of Lap Band  . LAPAROSCOPIC GASTRIC BANDING  08/08/2008  . TONSILLECTOMY  1981    Inpatient Medications: Scheduled Meds:  Continuous Infusions:  PRN Meds:  Allergies:    Allergies  Allergen Reactions  . Cephalosporins Anaphylaxis and Other (See Comments)    Caused hypotension and a fever  . Eggs Or Egg-Derived Products Hives, Swelling and Other (See Comments)    Angioedema  . Penicillins Anaphylaxis    Has patient had a PCN reaction causing immediate rash, facial/tongue/throat swelling, SOB or lightheadedness with hypotension: Yes Has patient had a PCN reaction causing severe rash involving mucus membranes or skin necrosis: No Has patient had a PCN reaction that required hospitalization: Observed X12 hrs Has patient had a PCN reaction occurring within the last 10 years: No If all of the above answers  are "NO", then may proceed with Cephalosporin use.   . Sulfa Drugs Cross Reactors Other (See Comments)    Liver failure  . Adhesive [Tape] Hives and Itching    Cannot wear for any length of time  . Effexor [Venlafaxine] Other (See Comments)    Lightheadedness, dizziness, and headaches resulted    Social  History:   Social History   Socioeconomic History  . Marital status: Married    Spouse name: Not on file  . Number of children: Not on file  . Years of education: Not on file  . Highest education level: Not on file  Occupational History    Employer: [employer]  . Financial resource strain: Not on file  . Food insecurity:    Worry: Not on file    Inability: Not on file  . Transportation needs:    Medical: Not on file    Non-medical: Not on file  Tobacco Use  . Smoking status: Never Smoker  . Smokeless tobacco: Never Used  Substance and Sexual Activity  . Alcohol use: No  . Drug use: No  . Sexual activity: Yes    Birth control/protection: IUD    Comment: Vasectomy in spouse  Lifestyle  . Physical activity:    Days per week: Not on file    Minutes per session: Not on file  . Stress: Not on file  Relationships  . Social connections:    Talks on phone: Not on file    Gets together: Not on file    Attends religious service: Not on file    Active member of club or organization: Not on file    Attends meetings of clubs or organizations: Not on file    Relationship status: Not on file  . Intimate partner violence:    Fear of current or ex partner: Not on file    Emotionally abused: Not on file    Physically abused: Not on file    Forced sexual activity: Not on file  Other Topics Concern  . Not on file  Social History Narrative  . Not on file    Family History:   Family History  Problem Relation Age of Onset  . Diabetes Mother   . Obesity Mother   . Colon polyps Mother   . Colon cancer Mother   . Obesity Father   . Hypertension Father   . Obesity Sister     ROS:  Please see the history of present illness.  All other ROS reviewed and negative.     Physical Exam/Data:   Vitals:   06/01/18 1416  BP: (!) 118/95  Pulse: 73  Resp: 17  Temp: 98.1 F (36.7 C)  TempSrc: Oral  SpO2: 99%  Weight: (!) 305 lb (138.3 kg)  Height: 5\' 10"  (1.778 m)     No intake or output data in the 24 hours ending 06/01/18 1621 Filed Weights   06/01/18 1416  Weight: (!) 305 lb (138.3 kg)   Body mass index is 43.76 kg/m.  General:  Well nourished, well developed, in no acute distress HEENT: normal Lymph: no adenopathy Neck: no JVD Endocrine:  No thryomegaly Vascular: No carotid bruits; FA pulses 2+ bilaterally without bruits  Cardiac:  normal S1, S2; RRR; no murmur  Lungs:  clear to auscultation bilaterally, no wheezing, rhonchi or rales  Abd: soft, nontender, no hepatomegaly  Ext: no edema Musculoskeletal:  No deformities, BUE and BLE strength normal and equal Skin: warm and dry  Neuro:  CNs 2-12 intact, no focal abnormalities noted Psych:  Normal affect   Relevant CV Studies: As above  Laboratory Data:  Chemistry Recent Labs  Lab 05/31/18 1158 06/01/18 1437  NA 140 141  K 3.4* 3.8  CL 105 108  CO2 24 24  GLUCOSE 93 126*  BUN 12 11  CREATININE 0.85 0.77  CALCIUM 10.3 10.6*  GFRNONAA >60 >60  GFRAA >60 >60  ANIONGAP 11 9    Hematology Recent Labs  Lab 05/31/18 1158 06/01/18 1437  WBC 8.3 8.5  RBC 5.38* 5.32*  HGB 15.4* 15.1*  HCT 45.4 46.4*  MCV 84.4 87.2  MCH 28.6 28.4  MCHC 33.9 32.5  RDW 13.3 13.0  PLT 280 286   Cardiac Enzymes Recent Labs  Lab 05/31/18 1158 05/31/18 1519  TROPONINI <0.03 <0.03    Recent Labs  Lab 06/01/18 1455  TROPIPOC 0.00    DDimer  Recent Labs  Lab 05/31/18 1158  DDIMER 0.40   Radiology/Studies:  Dg Chest 2 View  Result Date: 05/31/2018 CLINICAL DATA:  Chest heaviness and pain beginning 1 o'clock yesterday. Increased pain with exertion today. EXAM: CHEST - 2 VIEW COMPARISON:  Two-view chest x-ray 02/10/2018 FINDINGS: Heart size is normal. Ill-defined medial right lower lobe airspace disease is present. There is no edema or effusion. Dextroconvex curvature of the thoracic spine is stable. The visualized soft tissues and bony thorax are otherwise unremarkable. IMPRESSION: 1.  Ill-defined medial right lower lobe airspace disease concerning for developing pneumonia. 2. Stable curvature of the thoracic spine. Electronically Signed   By: San Morelle M.D.   On: 05/31/2018 14:28   Assessment and Plan:   1. Palpitations -Long-standing history for many years.  Worsen in past 2 days.  Work-up in ER yesterday revealed PACs. -Check TSH given history of thyroid nodules.  She will get AliveCore.   2.  Chest tightness -Mixed symptoms.  Not exacerbated by exercise.  However, some exertional limitation recently.  POC troponin negative. D-dimer was normal yesterday. EKG with nonspecific T wave inversion inferiorlly.  Will get outpatient POET.   For questions or updates, please contact [facility] Please consult [email or web address] for contact info under Cardiology/STEMI.   Mahalia Longest [place], [place]  06/01/2018 4:21 PM   History and all data above reviewed.  Patient examined.  I agree with the findings as above. The patient present with palpitations similar to her known PACs.  she was having these in a bigeminal pattern and this was noted on EKG at [facility]. However, she came into the ED today with palpitations and chest and left arm pain as above.  No acute EKG changes.  Cardiac enzymes are negative.  She has not otherwise had any recent cardiac symptoms.  The patient exam reveals COR:RRR  ,  Lungs: Clear  ,  Abd: Positive bowel sounds, no rebound no guarding, Ext No edema  .  All available labs, radiology testing, previous records reviewed. Agree with documented assessment and plan. Chest pain:  Atypical.  No objective evidence of ischemia.  Will plan POET (Plain Old Exercise Treadmill) in the office.  Palpitations:  Discussed with the patient and the ED MD pill in pocket low dose beta blocker.   Jeneen Rinks Roshaun Pound  5:19 PM  06/01/2018

## 2018-06-01 NOTE — Telephone Encounter (Signed)
Patient seen in ER over the weekend for abnormal sinus rhythm, patient was told to follow up with Cardiologist. Insurance requires referral. Requesting referral to see Dr Burt Knack. She says she called this morning and wants to know the status. Wants to see him soon and would like a call back today.

## 2018-06-02 ENCOUNTER — Telehealth: Payer: Self-pay | Admitting: Family Medicine

## 2018-06-02 NOTE — Telephone Encounter (Signed)
See note

## 2018-06-02 NOTE — Telephone Encounter (Unsigned)
Copied from [place] [id number]. Topic: Quick Communication - See Telephone Encounter >> Jun 02, 2018 11:07 AM Neva Seat wrote: Pt returned call back from yesterday.  Pt needing a call back regarding the cardiologist referral.

## 2018-06-03 ENCOUNTER — Telehealth: Payer: Self-pay | Admitting: *Deleted

## 2018-06-03 ENCOUNTER — Telehealth: Payer: Self-pay

## 2018-06-03 NOTE — Telephone Encounter (Signed)
Called patient to schedule ETT and f/u ED visit with Dr. Percival Spanish.  Patient states she spoke with someone yesterday about this and she has requested to see Dr. Burt Knack (he sees other family members).  The young lady she spoke with told her she would reach out to Dr. Antionette Char nurse with this request and get back with her.  I scheduled the ETT and will schedule f/u visit when decision is made.

## 2018-06-03 NOTE — Telephone Encounter (Signed)
Patient has received call and app made from cardiology

## 2018-06-03 NOTE — Telephone Encounter (Signed)
Olivia [name] was recently seen in the ED for CP and bigeminy. She was scheduled for a GXT this Friday, 8/2 and is supposed to follow-up with Olivia [name].   She states she would prefer to see Olivia [name] as he treats her husband and father. She has been scheduled on September 4 with Olivia [name].  She requests Olivia [name] review stress test when done and that she is called with his thoughts prior to 9/4 visit. She understands he will not be in the office until 8/12.   She was grateful for assistance.

## 2018-06-04 NOTE — Telephone Encounter (Signed)
This is fine with me. Please route ETT to me when completed and I will review result.

## 2018-06-05 ENCOUNTER — Ambulatory Visit (INDEPENDENT_AMBULATORY_CARE_PROVIDER_SITE_OTHER): Payer: No Typology Code available for payment source

## 2018-06-05 DIAGNOSIS — R079 Chest pain, unspecified: Secondary | ICD-10-CM | POA: Diagnosis not present

## 2018-06-05 LAB — EXERCISE TOLERANCE TEST
CHL CUP MPHR: 170 {beats}/min
CHL CUP RESTING HR STRESS: 171 {beats}/min
CSEPHR: 100 %
Estimated workload: 6 METS
Exercise duration (min): 4 min
Exercise duration (sec): 13 s
Peak HR: 171 {beats}/min
RPE: 17

## 2018-06-05 NOTE — Telephone Encounter (Signed)
Vin Bhagat, PA has routed the ETT to Dr. Burt Knack.

## 2018-06-05 NOTE — Telephone Encounter (Signed)
OK 

## 2018-06-09 MED FILL — METOPROLOL TARTRATE 25 MG T: 25 | 60 days supply | Qty: 60 | Fill #0

## 2018-07-08 ENCOUNTER — Encounter: Payer: Self-pay | Admitting: Cardiovascular Disease

## 2018-07-08 ENCOUNTER — Ambulatory Visit: Payer: No Typology Code available for payment source | Admitting: Cardiovascular Disease

## 2018-07-08 VITALS — BP 128/78 | HR 87 | Ht 70.0 in | Wt 309.8 lb

## 2018-07-08 DIAGNOSIS — R002 Palpitations: Secondary | ICD-10-CM

## 2018-07-08 DIAGNOSIS — R079 Chest pain, unspecified: Secondary | ICD-10-CM | POA: Diagnosis not present

## 2018-07-08 NOTE — Progress Notes (Signed)
Cardiology Office Note Date:  07/08/2018   ID:  Olivia [name], DOB 11/13/1965, MRN [id number]  PCP:  Briscoe Deutscher, DO  Cardiologist:  Sherren Mocha, MD    Chief Complaint  Patient presents with  . Chest Pain  . Shortness of Breath    History of Present Illness: Olivia [name] is a 52 y.o. female who presents for evaluation of chest pain, shortness of breath, and heart palpitations.  The patient works as a Designer, jewellery.  She had been in her normal state of health until she developed the symptoms noted above at the end of July of this year.  She has a history of scoliosis and obesity. She's been working out with a Physiological scientist to try to achieve weight loss. After a workout last month, she experienced increased palpitations and shortness of breath. She rested and pushed fluids, symptoms improved over several hours. She was initially evaluated at [facility] and was noted to have ventricular bigeminy. 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 She developed recurrent symptoms the following day and was evaluated in the [facility] ED. Formal cardiology consultation was done by Dr Percival Spanish.  EKG and cardiac enzymes were negative.  Her initial EKG from the previous day demonstrated ventricular bigeminy.  She continues to have palpitations and associated chest pressure. She's been unable to tolerate beta-blockers even at low dose because of bradycardia even with very low doses of metoprolol. No lightheadedness, syncope, orthopnea, or PND. She continues to have weakness and nausea associated with physical exertion.   Past Medical History:  Diagnosis Date  . Asthma   . Environmental allergies   . Hiatal hernia   . HLD (hyperlipidemia) 04/15/2008  . Microcytic anemia   . Migraine, hormonal   . PACs (premature atrial contraction)   . PCOS (polycystic ovarian syndrome) 10/25/2014  . Pneumonia 3/13  . PONV (postoperative  nausea and vomiting)   . Reflux   . Seasonal allergies     Past Surgical History:  Procedure Laterality Date  . HIATAL HERNIA REPAIR  04/14/2012   Procedure: LAPAROSCOPIC REPAIR OF HIATAL HERNIA;  Surgeon: Pedro Earls, MD;  Location: [facility];  Service: General;  Laterality: N/A;  Hiatal Hernia Repair-Replacement of Lap Band  . LAPAROSCOPIC GASTRIC BANDING  08/08/2008  . TONSILLECTOMY  1981    Current Outpatient Medications  Medication Sig Dispense Refill  . albuterol (PROVENTIL HFA) 108 (90 Base) MCG/ACT inhaler Inhale 2 puffs into the lungs every 6 (six) hours as needed for wheezing or shortness of breath.    Marland Kitchen albuterol (PROVENTIL) (2.5 MG/3ML) 0.083% nebulizer solution Take 3 mLs (2.5 mg total) by nebulization every 6 (six) hours as needed for wheezing or shortness of breath. 75 mL 12  . beclomethasone (QVAR) 80 MCG/ACT inhaler Inhale 1 puff into the lungs as needed.    . Calcium Carb-Cholecalciferol (CALCIUM+D3 PO) Take 1 tablet by mouth 3 (three) times a week.    . cetirizine (ZYRTEC) 10 MG tablet Take 10 mg by mouth at bedtime.     . ENSTILAR 0.005-0.064 % FOAM APPLY 1 APPLICATION ON THE SKIN TWICE A DAY  3  . levonorgestrel (LILETTA, 52 MG,) 18.6 MCG/DAY IUD IUD 1 each by Intrauterine route once.    . metoprolol tartrate (LOPRESSOR) 25 MG tablet Take 0.5 tablets (12.5 mg total) by mouth 2 (two) times daily. 60 tablet 0  . Multiple Vitamin (MULITIVITAMIN WITH MINERALS) TABS Take 1  tablet by mouth daily.    . Phentermine-Topiramate 3.75-23 MG CP24 One po daily x 2 weeks 14 capsule 0  . TACLONEX external suspension Apply 1 application topically See admin instructions. Apply 1 application to the skin two times a day to psoriasis-affected areas  3  . tiZANidine (ZANAFLEX) 4 MG tablet Take 1 tablet (4 mg total) by mouth every 6 (six) hours as needed for muscle spasms. 40 tablet 2   No current facility-administered medications for this visit.     Allergies:   Cephalosporins; Eggs or  egg-derived products; Penicillins; Sulfa drugs cross reactors; Adhesive [tape]; and Effexor [venlafaxine]   Social History:  The patient  reports that she has never smoked. She has never used smokeless tobacco. She reports that she does not drink alcohol or use drugs.   Family History:  The patient's family history includes Colon cancer in her mother; Colon polyps in her mother; Diabetes in her mother; Hypertension in her father; Obesity in her father, mother, and sister.    ROS:  Please see the history of present illness.  Otherwise, review of systems is positive for chest pain, shortness of breath, back pain, rash, fatigue, nausea.  All other systems are reviewed and negative.    PHYSICAL EXAM: VS:  BP 128/78   Pulse 87   Ht 5\' 10"  (1.778 m)   Wt (!) 309 lb 12.8 oz (140.5 kg)   SpO2 95%   BMI 44.45 kg/m  , BMI Body mass index is 44.45 kg/m. GEN: Well nourished, well developed, pleasant obese woman in no acute distress  HEENT: normal  Neck: no JVD, no masses. No carotid bruits Cardiac: RRR without murmur or gallop                Respiratory:  clear to auscultation bilaterally, normal work of breathing GI: soft, nontender, nondistended, + BS MS: no deformity or atrophy  Ext: no pretibial edema, pedal pulses 2+= bilaterally Skin: warm and dry, no rash Neuro:  Strength and sensation are intact Psych: euthymic mood, full affect  EKG:  EKG is not ordered today.  Recent Labs: 12/08/2017: ALT 11 05/31/2018: Magnesium 1.9 06/01/2018: BUN 11; Creatinine, Ser 0.77; Hemoglobin 15.1; Platelets 286; Potassium 3.8; Sodium 141; TSH 0.935   Lipid Panel     Component Value Date/Time   CHOL 229 (H) 12/08/2017 0825   TRIG 112.0 12/08/2017 0825   HDL 78.10 12/08/2017 0825   CHOLHDL 3 12/08/2017 0825   VLDL 22.4 12/08/2017 0825   LDLCALC 128 (H) 12/08/2017 0825   LDLDIRECT 156.8 05/04/2013 1345      Wt Readings from Last 3 Encounters:  07/08/18 (!) 309 lb 12.8 oz (140.5 kg)  06/01/18 (!)  305 lb (138.3 kg)  05/31/18 (!) 305 lb (138.3 kg)     Cardiac Studies Reviewed: GXT: Study Highlights    The patient walked on a standard Bruce protocol treadmill test for 4 minutes and 13 seconds. She achieved a peak heart rate of 171 which is 100% predicted maximal heart rate.  The patient had no ST or T wave changes at baseline. At peak exercise, there were no ST or T wave changes to suggest ischemia. There were no significant arrhythmias.  Blood pressure demonstrated a hypertensive response to exercise.  This is interpreted as a negative stress test. There is no evidence of ischemia. The blood pressure response to exercise was hypertensive.    Stress Findings   Stress Findings The patient exercised following the Bruce protocol.  The  patient reported no symptoms during the stress test. The patient experienced no angina during the stress test.   The patient requested the test to be stopped. Test was stopped per protocol.   Heart rate demonstrated a normal response to exercise. Blood pressure demonstrated a hypertensive response to exercise. Overall, the patient's exercise capacity was moderately impaired.   85% of maximum heart rate was achieved after 1.4 minutes. Recovery time: 6 minutes. The patient's response to exercise was adequate for diagnosis.  Stress Measurements   Baseline Vitals  Rest HR 171 bpm    Rest BP 132/90 mmHg    Exercise Time  Exercise duration (min) 4 min    Exercise duration (sec) 13 sec    Peak Stress Vitals  Peak HR 171 bpm    Peak BP 207/91 mmHg    Exercise Data  MPHR 170 bpm    Percent HR 100 %    RPE 17     Estimated workload 6 METS         ASSESSMENT AND PLAN: 1.  Exertional chest pain and shortness of breath 2.  Heart palpitations 3.  Morbid obesity body mass index greater than 40 4.  Ventricular bigeminy  Recommendations: Reviewed all available data from her recent emergency room evaluations.  The patient has progressive  symptoms of exercise intolerance, chest discomfort, and shortness of breath.  She has a long-standing history of mild heart palpitations but clearly notices worsening symptoms.  She has a family history of premature CAD.  I have recommended a gated coronary CTA with FFR to evaluate for obstructive CAD as a cause of her symptoms.  I have also recommended an echocardiogram and 24-hour Holter monitor to better characterize her heart palpitations and evaluate her for any evidence of structural heart abnormality.  Further recommendations will be made based on the results of her testing.  Current medicines are reviewed with the patient today.  The patient does not have concerns regarding medicines.  Labs/ tests ordered today include:   Orders Placed This Encounter  Procedures  . CT CORONARY MORPH W/CTA COR W/SCORE W/CA W/CM &/OR WO/CM  . CT CORONARY FRACTIONAL FLOW RESERVE DATA PREP  . CT CORONARY FRACTIONAL FLOW RESERVE FLUID ANALYSIS  . Basic metabolic panel  . HOLTER MONITOR - 48 HOUR  . ECHOCARDIOGRAM COMPLETE    Disposition:   FU pending test results  Signed, Sherren Mocha, MD  07/08/2018 1:38 PM    [facility] [place], [place],   [address] Phone: [phone]; Fax: [phone]

## 2018-07-08 NOTE — Patient Instructions (Addendum)
Medication Instructions:  Your provider recommends that you continue on your current medications as directed. Please refer to the Current Medication list given to you today.    Labwork: Today: BMET  Testing/Procedures: Your physician has recommended that you wear a holter monitor. Holter monitors are medical devices that record the heart's electrical activity. Doctors most often use these monitors to diagnose arrhythmias. Arrhythmias are problems with the speed or rhythm of the heartbeat. The monitor is a small, portable device. You can wear one while you do your normal daily activities. This is usually used to diagnose what is causing palpitations/syncope (passing out).   Dr. Burt Knack recommends you have a cardiac CT.  Your provider has requested that you have an echocardiogram. Echocardiography is a painless test that uses sound waves to create images of your heart. It provides your doctor with information about the size and shape of your heart and how well your heart's chambers and valves are working. This procedure takes approximately one hour. There are no restrictions for this procedure.  Follow-Up: Your provider recommends that you schedule a follow-up appointment AS NEEDED pending study results.  Any Other Special Instructions Will Be Listed Below (If Applicable). Please arrive at the [facility] main entrance of [facility] at xx:xx AM (30-45 minutes prior to test start time)  [facility] [place], [place] [address] [phone]  Proceed to the [facility] Radiology Department (First Floor).  Please follow these instructions carefully:  On the Night Before the Test: . Drink plenty of water. . Do not consume any caffeinated/decaffeinated beverages or chocolate 12 hours prior to your test. . Do not take any antihistamines 12 hours prior to your test.   On the Day of the Test: . Drink plenty of water. Do not drink any water within one hour of  the test. . Do not eat any food 4 hours prior to the test. . You may take your regular medications prior to the test.   After the Test: . Drink plenty of water. . After receiving IV contrast, you may experience a mild flushed feeling. This is normal. . On occasion, you may experience a mild rash up to 24 hours after the test. This is not dangerous. If this occurs, you can take Benadryl 25 mg and increase your fluid intake. . If you experience trouble breathing, this can be serious. If it is severe call 911 IMMEDIATELY. If it is mild, please call our office.

## 2018-07-09 LAB — BASIC METABOLIC PANEL
BUN/Creatinine Ratio: 12 (ref 9–23)
BUN: 9 mg/dL (ref 6–24)
CALCIUM: 10.6 mg/dL — AB (ref 8.7–10.2)
CO2: 22 mmol/L (ref 20–29)
Chloride: 105 mmol/L (ref 96–106)
Creatinine, Ser: 0.76 mg/dL (ref 0.57–1.00)
GFR, EST AFRICAN AMERICAN: 106 mL/min/{1.73_m2} (ref >59)
GFR, EST NON AFRICAN AMERICAN: 92 mL/min/{1.73_m2} (ref >59)
Glucose: 92 mg/dL (ref 65–99)
POTASSIUM: 4.6 mmol/L (ref 3.5–5.2)
Sodium: 142 mmol/L (ref 134–144)

## 2018-07-20 ENCOUNTER — Ambulatory Visit (INDEPENDENT_AMBULATORY_CARE_PROVIDER_SITE_OTHER): Payer: No Typology Code available for payment source

## 2018-07-20 ENCOUNTER — Ambulatory Visit (HOSPITAL_COMMUNITY): Payer: No Typology Code available for payment source | Attending: Cardiovascular Disease

## 2018-07-20 ENCOUNTER — Other Ambulatory Visit: Payer: Self-pay

## 2018-07-20 DIAGNOSIS — I491 Atrial premature depolarization: Secondary | ICD-10-CM | POA: Diagnosis not present

## 2018-07-20 DIAGNOSIS — R079 Chest pain, unspecified: Secondary | ICD-10-CM

## 2018-07-20 DIAGNOSIS — I071 Rheumatic tricuspid insufficiency: Secondary | ICD-10-CM | POA: Insufficient documentation

## 2018-07-20 DIAGNOSIS — J45909 Unspecified asthma, uncomplicated: Secondary | ICD-10-CM | POA: Insufficient documentation

## 2018-07-20 DIAGNOSIS — E785 Hyperlipidemia, unspecified: Secondary | ICD-10-CM | POA: Insufficient documentation

## 2018-07-20 DIAGNOSIS — R002 Palpitations: Secondary | ICD-10-CM | POA: Diagnosis not present

## 2018-07-20 DIAGNOSIS — J189 Pneumonia, unspecified organism: Secondary | ICD-10-CM | POA: Diagnosis not present

## 2018-07-23 ENCOUNTER — Telehealth: Payer: Self-pay

## 2018-07-23 DIAGNOSIS — I071 Rheumatic tricuspid insufficiency: Secondary | ICD-10-CM

## 2018-07-23 NOTE — Telephone Encounter (Signed)
Attempted to call patient. Left message that the echo will be released to MyChart with Dr. Antionette Char comments. Released results with Dr. Antionette Char comments to MyChart with a note that she will be contacted with her other results when they are available.  Repeat echo ordered to be scheduled in 1 year.

## 2018-07-23 NOTE — Telephone Encounter (Signed)
-----   Message from Sherren Mocha, MD sent at 07/21/2018  2:30 PM EDT ----- Moderate TR noted. Otherwise normal study. I don't think this requires further evaluation and wouldn't expect it to play a role in her symptoms. Would repeat an echo in one year.

## 2018-07-28 ENCOUNTER — Ambulatory Visit (HOSPITAL_COMMUNITY): Admission: RE | Admit: 2018-07-28 | Payer: No Typology Code available for payment source | Source: Ambulatory Visit

## 2018-07-28 ENCOUNTER — Ambulatory Visit (HOSPITAL_COMMUNITY)
Admission: RE | Admit: 2018-07-28 | Discharge: 2018-07-28 | Disposition: A | Discharge status: Home / Self Care | Payer: No Typology Code available for payment source | Source: Ambulatory Visit | Attending: Cardiovascular Disease | Admitting: Cardiovascular Disease

## 2018-07-28 ENCOUNTER — Encounter (HOSPITAL_COMMUNITY): Payer: Self-pay

## 2018-07-28 DIAGNOSIS — J9811 Atelectasis: Secondary | ICD-10-CM | POA: Insufficient documentation

## 2018-07-28 DIAGNOSIS — R079 Chest pain, unspecified: Secondary | ICD-10-CM | POA: Diagnosis not present

## 2018-07-28 DIAGNOSIS — K449 Diaphragmatic hernia without obstruction or gangrene: Secondary | ICD-10-CM | POA: Diagnosis not present

## 2018-07-28 MED ORDER — IOPAMIDOL (ISOVUE-370) INJECTION 76%
100.0000 mL | Freq: Once | INTRAVENOUS | Status: AC | PRN
  Administered 2018-07-28: 100 mL via INTRAVENOUS

## 2018-07-28 MED ORDER — NITROGLYCERIN 0.4 MG SL SUBL
0.8000 mg | SUBLINGUAL_TABLET | Freq: Once | SUBLINGUAL | Status: DC
  Filled 2018-07-28: qty 25

## 2018-07-28 MED ORDER — METOPROLOL TARTRATE 5 MG/5ML IV SOLN
INTRAVENOUS | Status: AC
  Administered 2018-07-28: 5 mg via INTRAVENOUS
  Filled 2018-07-28: qty 20

## 2018-07-28 MED ORDER — NITROGLYCERIN 0.4 MG SL SUBL
SUBLINGUAL_TABLET | SUBLINGUAL | Status: AC
  Administered 2018-07-28: 0.8 mg
  Filled 2018-07-28: qty 2

## 2018-07-28 MED ORDER — METOPROLOL TARTRATE 5 MG/5ML IV SOLN
5.0000 mg | INTRAVENOUS | Status: DC | PRN
  Administered 2018-07-28: 5 mg via INTRAVENOUS
  Filled 2018-07-28: qty 5

## 2018-08-04 ENCOUNTER — Telehealth: Payer: Self-pay

## 2018-08-04 DIAGNOSIS — I2510 Atherosclerotic heart disease of native coronary artery without angina pectoris: Secondary | ICD-10-CM

## 2018-08-04 MED ORDER — ROSUVASTATIN CALCIUM 10 MG PO TABS: 10.0000 mg | ORAL_TABLET | Freq: Every day | ORAL | 11 refills | Status: DC

## 2018-08-04 NOTE — Telephone Encounter (Signed)
Informed patient of results and verbal understanding expressed.  Instructed patient to START CRESTOR 10 mg daily. She will contact the office to arrange follow-up labs. She was grateful for call and agrees with treatment plan.

## 2018-08-04 NOTE — Telephone Encounter (Signed)
-----   Message from Sherren Mocha, MD sent at 08/03/2018  2:41 AM EDT ----- Nonobstructive CAD noted. Overall Ca score is low but compared to age/gender matched cohort she is at 90th percentile. Labs reviewed and showed LDL 128 with high HDL cholesterol. Based on CT findings would start on rosuvastatin 10 mg and FU labs in 3 months. thx

## 2018-08-16 DIAGNOSIS — R931 Abnormal findings on diagnostic imaging of heart and coronary circulation: Secondary | ICD-10-CM | POA: Insufficient documentation

## 2018-08-16 NOTE — Progress Notes (Signed)
Olivia [name] is a 52 y.o. female is here for follow up.  History of Present Illness:   HPI: Patient started exercising again for weight loss. Never started the Qsymia. After a workout last month, she experienced increased palpitations and shortness of breath. She rested and pushed fluids, symptoms improved over several hours. She was initially evaluated at [facility] and was noted to have ventricular bigeminy. 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 She developed recurrent symptoms the following day and was evaluated in the [facility] ED. Formal cardiology consultation was done by Dr Percival Spanish.  EKG and cardiac enzymes were negative.  Her initial EKG from the previous day demonstrated ventricular bigeminy.  She continues to have palpitations and associated chest pressure. No lightheadedness, syncope, orthopnea, or PND. Echo: 55-60%, trivial mitral regurgitation. Exercise tolerance test: This is a normal study with the exception of poor exercise tolerance of 4 minutes 13 seconds. Hypertensive response noted. No significant EKG changes. Cardiac CT: 1. Coronary artery calcium score 9.5 Agatston units. This places the patient in the 90th percentile for age and gender. This suggests high risk for future cardiac events. 2. This study is limited by artifact. However, there only appears to be plaque in the proximal LAD. There is no more than mild stenosis. 3.Small hiatal hernia. Distal circumferential esophageal wall thickening may reflect esophagitis. Dependent atelectasis in the lung bases.  NOTE: Previous to above, patient had fall with head trauma and resulting concussion symptoms that lasted for a few months. Feels back to baseline now. She also had PNA and was treated just a few weeks prior to the cardiac issues.  Rash: Buttocks, legs, and intertriginous areas. Known psoriasis. I referred to Dermatology to see if biologic indicated as her  symptoms have been worsening. She feels that the Dermatologist was dismissive. Cream prescribed that was mildly helpful.   Health Maintenance Due  Topic Date Due  . MAMMOGRAM  03/07/1986  . TETANUS/TDAP  03/08/1987  . PAP SMEAR  03/07/1989   Depression screen [facility] 2/9 12/08/2017 07/12/2016  Decreased Interest 0 3  Down, Depressed, Hopeless 2 1  PHQ - 2 Score 2 4  Altered sleeping 3 3  Tired, decreased energy 3 3  Change in appetite 1 3  Feeling bad or failure about yourself  3 2  Trouble concentrating 3 2  Moving slowly or fidgety/restless 3 2  Suicidal thoughts 0 0  PHQ-9 Score 18 19  Difficult doing work/chores Somewhat difficult Very difficult   PMHx, SurgHx, SocialHx, FamHx, Medications, and Allergies were reviewed in the Visit Navigator and updated as appropriate.   Patient Active Problem List   Diagnosis Date Noted  . Hiatal hernia without gangrene or obstruction 08/19/2018  . Hypercalcemia 08/19/2018  . Goiter, nodular 08/19/2018  . Atopic dermatitis 08/19/2018  . Elevated coronary artery calcium score, 90th percentile for age and gender 08/16/2018  . Vitamin D deficiency 01/05/2018  . Psoriasis, clobetasol, referred to Dermatology 01/05/2018  . Chronic low back pain with sciatica 01/05/2018  . Hisotry of microcytic anemia, high risk after bariatric surgery 12/07/2017  . IUD (intrauterine device) in place, Liletta 12/07/2017  . Tension headache 10/04/2016  . PCOS, Metformin, IUD 10/25/2014  . Migraine with aura 01/11/2014  . Lapband APS resited with [facility] repair June 2013 05/08/2012  . HLD (hyperlipidemia), on Crestor 04/15/2008  . Morbid obesity ([place]) 04/15/2008  . Allergic rhinitis, Zyrtec and Singulair 04/15/2008  . Mild intermittent  asthma, albuterol prn 04/15/2008   Social History   Tobacco Use  . Smoking status: Never Smoker  . Smokeless tobacco: Never Used  Substance Use Topics  . Alcohol use: No  . Drug use: No   Current Medications and Allergies:   .   albuterol (PROVENTIL HFA) 108 (90 Base) MCG/ACT inhaler, Inhale 2 puffs into the lungs every 6 (six) hours as needed for wheezing or shortness of breath., Disp: , Rfl:  .  albuterol (PROVENTIL) (2.5 MG/3ML) 0.083% nebulizer solution, Take 3 mLs (2.5 mg total) by nebulization every 6 (six) hours as needed for wheezing or shortness of breath., Disp: 75 mL, Rfl: 12 .  beclomethasone (QVAR) 80 MCG/ACT inhaler, Inhale 1 puff into the lungs as needed., Disp: , Rfl:  .  Calcium Carb-Cholecalciferol (CALCIUM+D3 PO), Take 1 tablet by mouth 3 (three) times a week., Disp: , Rfl:  .  cetirizine (ZYRTEC) 10 MG tablet, Take 10 mg by mouth at bedtime. , Disp: , Rfl:  .  ENSTILAR 0.005-0.064 % FOAM, APPLY 1 APPLICATION ON THE SKIN TWICE A DAY, Disp: , Rfl: 3 .  levonorgestrel (LILETTA, 52 MG,) 18.6 MCG/DAY IUD IUD, 1 each by Intrauterine route once., Disp: , Rfl:  .  metoprolol tartrate (LOPRESSOR) 25 MG tablet, Take 0.5 tablets (12.5 mg total) by mouth 2 (two) times daily., Disp: 60 tablet, Rfl: 0 (USES PRN) .  Multiple Vitamin (MULITIVITAMIN WITH MINERALS) TABS, Take 1 tablet by mouth daily., Disp: , Rfl:  .  TACLONEX external suspension, Apply 1 application topically See admin instructions. Apply 1 application to the skin two times a day to psoriasis-affected areas, Disp: , Rfl: 3 .  tiZANidine (ZANAFLEX) 4 MG tablet, Take 1 tablet (4 mg total) by mouth every 6 (six) hours as needed for muscle spasms., Disp: 40 tablet, Rfl: 2 .  rosuvastatin (CRESTOR) 10 MG tablet, Take 1 tablet (10 mg total) by mouth daily. (Patient not taking: Reported on 08/17/2018), Disp: 30 tablet, Rfl: 11   Allergies  Allergen Reactions  . Cephalosporins Anaphylaxis and Other (See Comments)    Caused hypotension and a fever  . Eggs Or Egg-Derived Products Hives, Swelling and Other (See Comments)    Angioedema  . Penicillins Anaphylaxis    Has patient had a PCN reaction causing immediate rash, facial/tongue/throat swelling, SOB or  lightheadedness with hypotension: Yes Has patient had a PCN reaction causing severe rash involving mucus membranes or skin necrosis: No Has patient had a PCN reaction that required hospitalization: Observed X12 hrs Has patient had a PCN reaction occurring within the last 10 years: No If all of the above answers are "NO", then may proceed with Cephalosporin use.   . Sulfa Drugs Cross Reactors Other (See Comments)    Liver failure  . Adhesive [Tape] Hives and Itching    Cannot wear for any length of time  . Effexor [Venlafaxine] Other (See Comments)    Lightheadedness, dizziness, and headaches resulted   Review of Systems   Pertinent items are noted in the HPI. Otherwise, ROS is negative.  Vitals:   Vitals:   08/17/18 0722  BP: 122/82  Pulse: 86  Temp: 98.2 F (36.8 C)  TempSrc: Oral  SpO2: 93%  Weight: (!) 312 lb 9.6 oz (141.8 kg)  Height: 5\' 10"  (1.778 m)     Body mass index is 44.85 kg/m.  Physical Exam:   Physical Exam  Constitutional: She appears well-nourished.  HENT:  Head: Normocephalic and atraumatic.  Eyes: Pupils are equal, round,  and reactive to light. EOM are normal.  Neck: Normal range of motion. Neck supple.  Cardiovascular: Normal rate, regular rhythm, normal heart sounds and intact distal pulses.  Pulmonary/Chest: Effort normal.  Abdominal: Soft.  Skin: Skin is warm. Rash noted. Rash is maculopapular.     Psychiatric: She has a normal mood and affect. Her behavior is normal.  Nursing note and vitals reviewed.  Results for orders placed or performed in visit on 08/17/18  CBC with Differential/Platelet  Result Value Ref Range   WBC 6.0 4.0 - 10.5 K/uL   RBC 5.26 (H) 3.87 - 5.11 Mil/uL   Hemoglobin 14.9 12.0 - 15.0 g/dL   HCT 44.9 36.0 - 46.0 %   MCV 85.2 78.0 - 100.0 fl   MCHC 33.3 30.0 - 36.0 g/dL   RDW 13.7 11.5 - 15.5 %   Platelets 236.0 150.0 - 400.0 K/uL   Neutrophils Relative % 53.5 43.0 - 77.0 %   Lymphocytes Relative 32.3 12.0 - 46.0  %   Monocytes Relative 8.5 3.0 - 12.0 %   Eosinophils Relative 4.3 0.0 - 5.0 %   Basophils Relative 1.4 0.0 - 3.0 %   Neutro Abs 3.2 1.4 - 7.7 K/uL   Lymphs Abs 1.9 0.7 - 4.0 K/uL   Monocytes Absolute 0.5 0.1 - 1.0 K/uL   Eosinophils Absolute 0.3 0.0 - 0.7 K/uL   Basophils Absolute 0.1 0.0 - 0.1 K/uL  Comprehensive metabolic panel  Result Value Ref Range   Sodium 139 135 - 145 mEq/L   Potassium 4.0 3.5 - 5.1 mEq/L   Chloride 106 96 - 112 mEq/L   CO2 29 19 - 32 mEq/L   Glucose, Bld 104 (H) 70 - 99 mg/dL   BUN 12 6 - 23 mg/dL   Creatinine, Ser 0.76 0.40 - 1.20 mg/dL   Total Bilirubin 0.7 0.2 - 1.2 mg/dL   Alkaline Phosphatase 54 39 - 117 U/L   AST 16 0 - 37 U/L   ALT 14 0 - 35 U/L   Total Protein 6.9 6.0 - 8.3 g/dL   Albumin 4.2 3.5 - 5.2 g/dL   Calcium 10.7 (H) 8.4 - 10.5 mg/dL   GFR 85.46 >60.00 mL/min  ANA  Result Value Ref Range   Anti Nuclear Antibody(ANA) NEGATIVE NEGATIVE  Sedimentation rate  Result Value Ref Range   Sed Rate 31 (H) 0 - 30 mm/hr  C-reactive protein  Result Value Ref Range   CRP 0.5 0.5 - 20.0 mg/dL  HIV Antibody (routine testing w rflx)  Result Value Ref Range   HIV 1&2 Ab, 4th Generation NON-REACTIVE NON-REACTI   IMPRESSION: Multinodular goiter with multiple bilateral thyroid nodules. These are generally round and hypoechoic. There is a mildly complex cyst in the right lobe measuring up to 1.1 cm. The largest solid nodule is in the inferior right lobe measures 1.8 x 1.1 x 1.4 cm. This could either be sampled or followed by ultrasound.  Electronically Signed   By: Aletta Edouard M.D.   On: 06/12/2015 15:05  Assessment and Plan:   Lynnie was seen today for follow-up.  Diagnoses and all orders for this visit:  PCOS, Metformin, IUD  Elevated coronary artery calcium score, 90th percentile for age and gender  Pure hypercholesterolemia  Morbid obesity ([place])  Mild intermittent asthma, unspecified whether  complicated Comments: Dependent atelectasis in the lung bases. Orders: -     Ambulatory referral to Pulmonology  Lapband APS resited with [facility] repair June 2013 Comments: Consider future sleeve  v bypass.  Psoriasis  Rash Comments: Left lower leg lesion slightly different than the others. Will Bx if labs normal.  Orders: -     CBC with Differential/Platelet -     Comprehensive metabolic panel -     ANA -     Sedimentation rate -     C-reactive protein -     HIV Antibody (routine testing w rflx)  Atopic dermatitis, unspecified type  Hiatal hernia without gangrene or obstruction Comments: With esopahgitis on CT. Asymptomatic.   Hypercalcemia Comments: Recheck with PTH in 2 weeks.   Goiter, nodular   . Reviewed expectations re: course of current medical issues. . Discussed self-management of symptoms. . Outlined signs and symptoms indicating need for more acute intervention. . Patient verbalized understanding and all questions were answered. Marland Kitchen Health Maintenance issues including appropriate healthy diet, exercise, and smoking avoidance were discussed with patient. . See orders for this visit as documented in the electronic medical record. . Patient received an After Visit Summary.  Briscoe Deutscher, DO [place], [place] [facility] 08/20/2018

## 2018-08-17 ENCOUNTER — Ambulatory Visit (INDEPENDENT_AMBULATORY_CARE_PROVIDER_SITE_OTHER): Payer: No Typology Code available for payment source | Admitting: Family Medicine

## 2018-08-17 ENCOUNTER — Encounter: Payer: Self-pay | Admitting: Family Medicine

## 2018-08-17 VITALS — BP 122/82 | HR 86 | Temp 98.2°F | Ht 70.0 in | Wt 312.6 lb

## 2018-08-17 DIAGNOSIS — E7800 Pure hypercholesterolemia, unspecified: Secondary | ICD-10-CM | POA: Diagnosis not present

## 2018-08-17 DIAGNOSIS — Z9884 Bariatric surgery status: Secondary | ICD-10-CM

## 2018-08-17 DIAGNOSIS — K449 Diaphragmatic hernia without obstruction or gangrene: Secondary | ICD-10-CM

## 2018-08-17 DIAGNOSIS — L209 Atopic dermatitis, unspecified: Secondary | ICD-10-CM

## 2018-08-17 DIAGNOSIS — R21 Rash and other nonspecific skin eruption: Secondary | ICD-10-CM

## 2018-08-17 DIAGNOSIS — R931 Abnormal findings on diagnostic imaging of heart and coronary circulation: Secondary | ICD-10-CM | POA: Diagnosis not present

## 2018-08-17 DIAGNOSIS — J4520 Mild intermittent asthma, uncomplicated: Secondary | ICD-10-CM

## 2018-08-17 DIAGNOSIS — L409 Psoriasis, unspecified: Secondary | ICD-10-CM

## 2018-08-17 DIAGNOSIS — E049 Nontoxic goiter, unspecified: Secondary | ICD-10-CM

## 2018-08-17 DIAGNOSIS — E282 Polycystic ovarian syndrome: Secondary | ICD-10-CM | POA: Diagnosis not present

## 2018-08-17 LAB — COMPREHENSIVE METABOLIC PANEL
ALT: 14 U/L (ref 0–35)
AST: 16 U/L (ref 0–37)
Albumin: 4.2 g/dL (ref 3.5–5.2)
Alkaline Phosphatase: 54 U/L (ref 39–117)
BUN: 12 mg/dL (ref 6–23)
CO2: 29 mEq/L (ref 19–32)
Calcium: 10.7 mg/dL — ABNORMAL HIGH (ref 8.4–10.5)
Chloride: 106 mEq/L (ref 96–112)
Creatinine, Ser: 0.76 mg/dL (ref 0.40–1.20)
GFR: 85.46 mL/min (ref >60.00)
Glucose, Bld: 104 mg/dL — ABNORMAL HIGH (ref 70–99)
Potassium: 4 mEq/L (ref 3.5–5.1)
Sodium: 139 mEq/L (ref 135–145)
Total Bilirubin: 0.7 mg/dL (ref 0.2–1.2)
Total Protein: 6.9 g/dL (ref 6.0–8.3)

## 2018-08-17 LAB — SEDIMENTATION RATE: Sed Rate: 31 mm/hr — ABNORMAL HIGH (ref 0–30)

## 2018-08-17 LAB — CBC WITH DIFFERENTIAL/PLATELET
Basophils Absolute: 0.1 10*3/uL (ref 0.0–0.1)
Basophils Relative: 1.4 % (ref 0.0–3.0)
Eosinophils Absolute: 0.3 10*3/uL (ref 0.0–0.7)
Eosinophils Relative: 4.3 % (ref 0.0–5.0)
HCT: 44.9 % (ref 36.0–46.0)
Hemoglobin: 14.9 g/dL (ref 12.0–15.0)
Lymphocytes Relative: 32.3 % (ref 12.0–46.0)
Lymphs Abs: 1.9 10*3/uL (ref 0.7–4.0)
MCHC: 33.3 g/dL (ref 30.0–36.0)
MCV: 85.2 fl (ref 78.0–100.0)
Monocytes Absolute: 0.5 10*3/uL (ref 0.1–1.0)
Monocytes Relative: 8.5 % (ref 3.0–12.0)
Neutro Abs: 3.2 10*3/uL (ref 1.4–7.7)
Neutrophils Relative %: 53.5 % (ref 43.0–77.0)
Platelets: 236 10*3/uL (ref 150.0–400.0)
RBC: 5.26 Mil/uL — ABNORMAL HIGH (ref 3.87–5.11)
RDW: 13.7 % (ref 11.5–15.5)
WBC: 6 10*3/uL (ref 4.0–10.5)

## 2018-08-17 LAB — C-REACTIVE PROTEIN: CRP: 0.5 mg/dL (ref 0.5–20.0)

## 2018-08-18 LAB — ANA: Anti Nuclear Antibody(ANA): NEGATIVE

## 2018-08-18 LAB — HIV ANTIBODY (ROUTINE TESTING W REFLEX): HIV 1&2 Ab, 4th Generation: NONREACTIVE

## 2018-08-19 ENCOUNTER — Encounter: Payer: Self-pay | Admitting: Family Medicine

## 2018-08-19 DIAGNOSIS — K449 Diaphragmatic hernia without obstruction or gangrene: Secondary | ICD-10-CM | POA: Insufficient documentation

## 2018-08-19 DIAGNOSIS — L209 Atopic dermatitis, unspecified: Secondary | ICD-10-CM | POA: Insufficient documentation

## 2018-08-19 DIAGNOSIS — E042 Nontoxic multinodular goiter: Secondary | ICD-10-CM

## 2018-08-19 DIAGNOSIS — E049 Nontoxic goiter, unspecified: Secondary | ICD-10-CM | POA: Insufficient documentation

## 2018-08-19 HISTORY — DX: Diaphragmatic hernia without obstruction or gangrene: K44.9

## 2018-08-19 HISTORY — DX: Nontoxic multinodular goiter: E04.2

## 2018-08-20 MED FILL — ROSUVASTATIN CALCIUM 10 MG: 10 | 30 days supply | Qty: 30 | Fill #0

## 2018-08-24 ENCOUNTER — Encounter: Payer: Self-pay | Admitting: Family Medicine

## 2018-09-03 NOTE — Progress Notes (Signed)
Olivia [name] is a 52 y.o. female is here for follow up.  History of Present Illness:   HPI: See Assessment and Plan section for Problem Based Charting of issues discussed today.   Health Maintenance Due  Topic Date Due  . MAMMOGRAM  03/07/1986  . TETANUS/TDAP  03/08/1987  . PAP SMEAR  03/07/1989   Depression screen [facility] 2/9 09/04/2018 12/08/2017 07/12/2016  Decreased Interest 0 0 3  Down, Depressed, Hopeless 0 2 1  PHQ - 2 Score 0 2 4  Altered sleeping - 3 3  Tired, decreased energy - 3 3  Change in appetite - 1 3  Feeling bad or failure about yourself  - 3 2  Trouble concentrating - 3 2  Moving slowly or fidgety/restless - 3 2  Suicidal thoughts - 0 0  PHQ-9 Score - 18 19  Difficult doing work/chores - Somewhat difficult Very difficult   PMHx, SurgHx, SocialHx, FamHx, Medications, and Allergies were reviewed in the Visit Navigator and updated as appropriate.   Patient Active Problem List   Diagnosis Date Noted  . Hiatal hernia without gangrene or obstruction 08/19/2018  . Hypercalcemia 08/19/2018  . Goiter, nodular 08/19/2018  . Atopic dermatitis 08/19/2018  . Elevated coronary artery calcium score, 90th percentile for age and gender 08/16/2018  . Vitamin D deficiency 01/05/2018  . Psoriasis, clobetasol, referred to Dermatology 01/05/2018  . Chronic low back pain with sciatica 01/05/2018  . Hisotry of microcytic anemia, high risk after bariatric surgery 12/07/2017  . IUD (intrauterine device) in place, Liletta 12/07/2017  . Tension headache 10/04/2016  . PCOS, Metformin, IUD 10/25/2014  . Migraine with aura 01/11/2014  . Lapband APS resited with [facility] repair June 2013 05/08/2012  . HLD (hyperlipidemia), on Crestor 04/15/2008  . Morbid obesity ([place]) 04/15/2008  . Allergic rhinitis, Zyrtec and Singulair 04/15/2008  . Mild intermittent asthma, albuterol prn 04/15/2008   Social History   Tobacco Use  . Smoking status: Never Smoker  . Smokeless tobacco: Never Used    Substance Use Topics  . Alcohol use: No  . Drug use: No   Current Medications and Allergies:   .  albuterol (PROVENTIL HFA) 108 (90 Base) MCG/ACT inhaler, Inhale 2 puffs into the lungs every 6 (six) hours as needed for wheezing or shortness of breath., Disp: , Rfl:  .  albuterol (PROVENTIL) (2.5 MG/3ML) 0.083% nebulizer solution, Take 3 mLs (2.5 mg total) by nebulization every 6 (six) hours as needed for wheezing or shortness of breath., Disp: 75 mL, Rfl: 12 .  beclomethasone (QVAR) 80 MCG/ACT inhaler, Inhale 1 puff into the lungs as needed., Disp: , Rfl:  .  Calcium Carb-Cholecalciferol (CALCIUM+D3 PO), Take 1 tablet by mouth 3 (three) times a week. .  cetirizine (ZYRTEC) 10 MG tablet, Take 10 mg by mouth at bedtime. , Disp: , Rfl:  .  ENSTILAR 0.005-0.064 % FOAM, APPLY 1 APPLICATION ON THE SKIN TWICE A DAY, Disp: , Rfl: 3 .  levonorgestrel (LILETTA, 52 MG,) 18.6 MCG/DAY IUD IUD, 1 each by Intrauterine route once., Disp: , Rfl:  .  metoprolol tartrate (LOPRESSOR) 25 MG tablet, Take 0.5 tablets (12.5 mg total) by mouth 2 (two) times daily., Disp: 60 tablet, Rfl: 0 .  Multiple Vitamin (MULITIVITAMIN WITH MINERALS) TABS, Take 1 tablet by mouth daily., Disp: , Rfl:  .  rosuvastatin (CRESTOR) 10 MG tablet, Take 1 tablet (10 mg total) by mouth daily., Disp: 30 tablet, Rfl: 11 .  TACLONEX external suspension, Apply 1 application topically  See admin instructions. Apply 1 application to the skin two times a day to psoriasis-affected areas, Disp: , Rfl: 3 .  tiZANidine (ZANAFLEX) 4 MG tablet, Take 1 tablet (4 mg total) by mouth every 6 (six) hours as needed for muscle spasms., Disp: 40 tablet, Rfl: 2   Allergies  Allergen Reactions  . Cephalosporins Anaphylaxis and Other (See Comments)    Caused hypotension and a fever  . Eggs Or Egg-Derived Products Hives, Swelling and Other (See Comments)    Angioedema  . Penicillins Anaphylaxis    Has patient had a PCN reaction causing immediate rash,  facial/tongue/throat swelling, SOB or lightheadedness with hypotension: Yes Has patient had a PCN reaction causing severe rash involving mucus membranes or skin necrosis: No Has patient had a PCN reaction that required hospitalization: Observed X12 hrs Has patient had a PCN reaction occurring within the last 10 years: No If all of the above answers are "NO", then may proceed with Cephalosporin use.   . Sulfa Drugs Cross Reactors Other (See Comments)    Liver failure  . Adhesive [Tape] Hives and Itching    Cannot wear for any length of time  . Effexor [Venlafaxine] Other (See Comments)    Lightheadedness, dizziness, and headaches resulted   Review of Systems   Pertinent items are noted in the HPI. Otherwise, ROS is negative.  Vitals:   Vitals:   09/04/18 0724  BP: 118/78  Pulse: 70  Temp: 98.7 F (37.1 C)  TempSrc: Oral  SpO2: 96%  Weight: (!) 315 lb 9.6 oz (143.2 kg)  Height: 5\' 10"  (1.778 m)     Body mass index is 45.28 kg/m.  Physical Exam:   Physical Exam  Constitutional: She appears well-nourished.  HENT:  Head: Normocephalic and atraumatic.  Eyes: Pupils are equal, round, and reactive to light. EOM are normal.  Neck: Normal range of motion. Neck supple.  Cardiovascular: Normal rate, regular rhythm, normal heart sounds and intact distal pulses.  Pulmonary/Chest: Effort normal.  Abdominal: Soft.  Skin: Skin is warm.  Multiple psoariatic plaques.Atypical lesion on left lateral lower leg.  Psychiatric: She has a normal mood and affect. Her behavior is normal.  Nursing note and vitals reviewed.  Assessment and Plan:   Diagnoses and all orders for this visit:  Skin lesion Comments: Punch biopsy Indication: suspicious lesion Location: left lateral lower leg Size: 2 x 2.5 cm Verbal informed consent obtained.  Pt aware of risks not limited to but including infection, bleeding, damage to near by organs. Prep: etoh/betadine Anesthesia: 1% lidocaine with epi,  good effect Punch made with 3 mm punch Minimal oozing, controlled with drysol Tolerated well Routine postprocedure instructions d/w pt- keep area clean and bandaged, follow up if concerns/spreading erythema/pain. Orders: -     Dermatology pathology  . Reviewed expectations re: course of current medical issues. . Discussed self-management of symptoms. . Outlined signs and symptoms indicating need for more acute intervention. . Patient verbalized understanding and all questions were answered. Marland Kitchen Health Maintenance issues including appropriate healthy diet, exercise, and smoking avoidance were discussed with patient. . See orders for this visit as documented in the electronic medical record. . Patient received an After Visit Summary.  Briscoe Deutscher, DO [place], [facility] 09/05/2018

## 2018-09-04 ENCOUNTER — Encounter: Payer: Self-pay | Admitting: Family Medicine

## 2018-09-04 ENCOUNTER — Ambulatory Visit (INDEPENDENT_AMBULATORY_CARE_PROVIDER_SITE_OTHER): Payer: No Typology Code available for payment source | Admitting: Family Medicine

## 2018-09-04 VITALS — BP 118/78 | HR 70 | Temp 98.7°F | Ht 70.0 in | Wt 315.6 lb

## 2018-09-04 DIAGNOSIS — L989 Disorder of the skin and subcutaneous tissue, unspecified: Secondary | ICD-10-CM

## 2018-09-05 ENCOUNTER — Encounter: Payer: Self-pay | Admitting: Family Medicine

## 2018-09-16 ENCOUNTER — Telehealth: Payer: Self-pay | Admitting: Family Medicine

## 2018-09-16 MED ORDER — BECLOMETHASONE DIPROPIONATE 80 MCG/ACT IN AERS: 1.0000 | INHALATION_SPRAY | RESPIRATORY_TRACT | 1 refills | Status: DC | PRN

## 2018-09-16 MED ORDER — METOPROLOL TARTRATE 25 MG PO TABS: 12.5000 mg | ORAL_TABLET | Freq: Two times a day (BID) | ORAL | 1 refills | Status: DC

## 2018-09-16 MED FILL — METOPROLOL TARTRATE 25 MG T: 25 | 60 days supply | Qty: 60 | Fill #0

## 2018-09-16 MED FILL — ROSUVASTATIN CALCIUM 10 MG: 10 | 30 days supply | Qty: 30 | Fill #1

## 2018-09-16 NOTE — Telephone Encounter (Signed)
Please see message and advise, you have never filled there Rx's.

## 2018-09-16 NOTE — Telephone Encounter (Signed)
Okay to fill? 

## 2018-09-16 NOTE — Telephone Encounter (Signed)
Copied from [place] [id number]. Topic: Quick Communication - Rx Refill/Question >> Sep 16, 2018  9:20 AM Judyann Munson wrote: Medication: beclomethasone (QVAR) 80 MCG/ACT inhaler and  metoprolol tartrate (LOPRESSOR) 25 MG tablet   Has the patient contacted their pharmacy? Yes  Preferred Pharmacy (with phone number or street name): [place], [place]. [phone] (Phone) [phone] (Fax)    Agent: Please be advised that RX refills may take up to 3 business days. We ask that you follow-up with your pharmacy.

## 2018-09-16 NOTE — Addendum Note (Signed)
Addended by: Marian Sorrow on: 09/16/2018 01:04 PM   Modules accepted: Orders

## 2018-09-16 NOTE — Telephone Encounter (Signed)
Left message on voicemail Rx's sent to pharmacy as requested. Any questions please call office.

## 2018-09-17 ENCOUNTER — Other Ambulatory Visit: Payer: Self-pay

## 2018-09-17 MED ORDER — BECLOMETHASONE DIPROP HFA 80 MCG/ACT IN AERB: 2.0000 | INHALATION_SPRAY | Freq: Two times a day (BID) | RESPIRATORY_TRACT | 0 refills | Status: DC

## 2018-09-17 MED FILL — QVAR REDIHALER 80 MCG/ACT A: 80 | 30 days supply | Qty: 11 | Fill #0

## 2018-09-28 ENCOUNTER — Institutional Professional Consult (permissible substitution): Payer: Self-pay | Admitting: Pulmonary Disease

## 2018-10-07 ENCOUNTER — Ambulatory Visit (INDEPENDENT_AMBULATORY_CARE_PROVIDER_SITE_OTHER): Payer: No Typology Code available for payment source | Admitting: Pulmonary Disease

## 2018-10-07 ENCOUNTER — Encounter: Payer: Self-pay | Admitting: Pulmonary Disease

## 2018-10-07 VITALS — BP 128/88 | HR 85 | Ht 70.0 in | Wt 315.2 lb

## 2018-10-07 DIAGNOSIS — Z6841 Body Mass Index (BMI) 40.0 and over, adult: Secondary | ICD-10-CM | POA: Diagnosis not present

## 2018-10-07 DIAGNOSIS — J302 Other seasonal allergic rhinitis: Secondary | ICD-10-CM

## 2018-10-07 DIAGNOSIS — R0600 Dyspnea, unspecified: Secondary | ICD-10-CM | POA: Diagnosis not present

## 2018-10-07 DIAGNOSIS — J4520 Mild intermittent asthma, uncomplicated: Secondary | ICD-10-CM | POA: Diagnosis not present

## 2018-10-07 DIAGNOSIS — J309 Allergic rhinitis, unspecified: Secondary | ICD-10-CM

## 2018-10-07 MED ORDER — BUDESONIDE-FORMOTEROL FUMARATE 160-4.5 MCG/ACT IN AERO: 2.0000 | INHALATION_SPRAY | Freq: Two times a day (BID) | RESPIRATORY_TRACT | 0 refills | Status: DC

## 2018-10-07 MED ORDER — MONTELUKAST SODIUM 10 MG PO TABS: 10.0000 mg | ORAL_TABLET | Freq: Every day | ORAL | 5 refills | Status: DC

## 2018-10-07 MED FILL — MONTELUKAST SOD 10 MG TAB: 10 | 30 days supply | Qty: 30 | Fill #0

## 2018-10-07 NOTE — Progress Notes (Signed)
Synopsis: Referred in December 2019 for mild intermittent asthma by Briscoe Deutscher, DO  Subjective:   PATIENT ID: Olivia [name] GENDER: female DOB: 1966-04-19, MRN: [id number]  Chief Complaint  Patient presents with  . Consult    States she was Dx with Asthma in her early 20's. She has never seen a pulmonologist and needs to get established.     PMH of asthma diagnosed in her 50s. She grew up in [place]. She moved here and developed significant allergies. She had very significant allergies. She has episodes of palpitations, was found to have bigeminy. She has been working out regularly. Going to the gym and riding the epiliptical 3-4 times per week. Albuterol PRN. Has used qvar in the past. Currently off Singulair for the past 6 to 9 months.  She has not been taking this regularly.  As for the Qvar she only used this as needed as well.  In the past year she has steroids and abx once this past year. She trys to avoid abx at all cost.  She developed significant rash requiring hospitalization following a sulfa drug ministration. She takes zytrec regularly.  Has seen allergist in the past.  Also uses Nettie pot regularly for sinus congestion.   Past Medical History:  Diagnosis Date  . Asthma   . Environmental allergies   . Hiatal hernia   . HLD (hyperlipidemia) 04/15/2008  . Microcytic anemia   . Migraine, hormonal   . PACs (premature atrial contraction)   . PCOS (polycystic ovarian syndrome) 10/25/2014  . Pneumonia 3/13  . PONV (postoperative nausea and vomiting)   . Reflux   . Seasonal allergies      Family History  Problem Relation Age of Onset  . Diabetes Mother   . Obesity Mother   . Colon polyps Mother   . Colon cancer Mother   . Obesity Father   . Hypertension Father   . Obesity Sister      Past Surgical History:  Procedure Laterality Date  . HIATAL HERNIA REPAIR  04/14/2012   Procedure: LAPAROSCOPIC REPAIR OF HIATAL HERNIA;  Surgeon: Pedro Earls, MD;   Location: [facility];  Service: General;  Laterality: N/A;  Hiatal Hernia Repair-Replacement of Lap Band  . LAPAROSCOPIC GASTRIC BANDING  08/08/2008  . TONSILLECTOMY  1981    Social History   Socioeconomic History  . Marital status: Married    Spouse name: Not on file  . Number of children: Not on file  . Years of education: Not on file  . Highest education level: Not on file  Occupational History  . Occupation: NP    Employer: [employer]  . Financial resource strain: Not on file  . Food insecurity:    Worry: Not on file    Inability: Not on file  . Transportation needs:    Medical: Not on file    Non-medical: Not on file  Tobacco Use  . Smoking status: Never Smoker  . Smokeless tobacco: Never Used  Substance and Sexual Activity  . Alcohol use: No  . Drug use: No  . Sexual activity: Yes    Birth control/protection: IUD    Comment: Vasectomy in spouse  Lifestyle  . Physical activity:    Days per week: Not on file    Minutes per session: Not on file  . Stress: Not on file  Relationships  . Social connections:    Talks on phone: Not on file    Gets together: Not  on file    Attends religious service: Not on file    Active member of club or organization: Not on file    Attends meetings of clubs or organizations: Not on file    Relationship status: Not on file  . Intimate partner violence:    Fear of current or ex partner: Not on file    Emotionally abused: Not on file    Physically abused: Not on file    Forced sexual activity: Not on file  Other Topics Concern  . Not on file  Social History Narrative  . Not on file     Allergies  Allergen Reactions  . Cephalosporins Anaphylaxis and Other (See Comments)    Caused hypotension and a fever  . Eggs Or Egg-Derived Products Hives, Swelling and Other (See Comments)    Angioedema  . Penicillins Anaphylaxis    Has patient had a PCN reaction causing immediate rash, facial/tongue/throat swelling, SOB or  lightheadedness with hypotension: Yes Has patient had a PCN reaction causing severe rash involving mucus membranes or skin necrosis: No Has patient had a PCN reaction that required hospitalization: Observed X12 hrs Has patient had a PCN reaction occurring within the last 10 years: No If all of the above answers are "NO", then may proceed with Cephalosporin use.   . Sulfa Drugs Cross Reactors Other (See Comments)    Liver failure  . Adhesive [Tape] Hives and Itching    Cannot wear for any length of time  . Effexor [Venlafaxine] Other (See Comments)    Lightheadedness, dizziness, and headaches resulted     Outpatient Medications Prior to Visit  Medication Sig Dispense Refill  . albuterol (PROVENTIL HFA) 108 (90 Base) MCG/ACT inhaler Inhale 2 puffs into the lungs every 6 (six) hours as needed for wheezing or shortness of breath.    Marland Kitchen albuterol (PROVENTIL) (2.5 MG/3ML) 0.083% nebulizer solution Take 3 mLs (2.5 mg total) by nebulization every 6 (six) hours as needed for wheezing or shortness of breath. 75 mL 12  . beclomethasone (QVAR REDIHALER) 80 MCG/ACT inhaler Inhale 2 puffs into the lungs 2 (two) times daily. 1 Inhaler 0  . cetirizine (ZYRTEC) 10 MG tablet Take 10 mg by mouth daily as needed.     . ENSTILAR 0.005-0.064 % FOAM APPLY 1 APPLICATION ON THE SKIN TWICE A DAY  3  . levonorgestrel (LILETTA, 52 MG,) 18.6 MCG/DAY IUD IUD 1 each by Intrauterine route once.    . metoprolol tartrate (LOPRESSOR) 25 MG tablet Take 0.5 tablets (12.5 mg total) by mouth 2 (two) times daily. (Patient taking differently: Take 12.5 mg by mouth daily. ) 60 tablet 1  . Multiple Vitamin (MULITIVITAMIN WITH MINERALS) TABS Take 1 tablet by mouth daily.    . rosuvastatin (CRESTOR) 10 MG tablet Take 1 tablet (10 mg total) by mouth daily. 30 tablet 11  . TACLONEX external suspension Apply 1 application topically See admin instructions. Apply 1 application to the skin two times a day to psoriasis-affected areas  3  .  tiZANidine (ZANAFLEX) 4 MG tablet Take 1 tablet (4 mg total) by mouth every 6 (six) hours as needed for muscle spasms. 40 tablet 2  . Calcium Carb-Cholecalciferol (CALCIUM+D3 PO) Take 1 tablet by mouth 3 (three) times a week.     No facility-administered medications prior to visit.     Review of Systems  Constitutional: Negative for chills, fever, malaise/fatigue and weight loss.  HENT: Negative for hearing loss, sore throat and tinnitus.   Eyes: Negative  for blurred vision and double vision.  Respiratory: Positive for shortness of breath and wheezing. Negative for cough, hemoptysis, sputum production and stridor.   Cardiovascular: Negative for chest pain, palpitations, orthopnea, leg swelling and PND.       Palpitations  Gastrointestinal: Negative for abdominal pain, constipation, diarrhea, heartburn, nausea and vomiting.  Genitourinary: Negative for dysuria, hematuria and urgency.  Musculoskeletal: Negative for joint pain and myalgias.  Skin: Negative for itching and rash.  Neurological: Negative for dizziness, tingling, weakness and headaches.  Endo/Heme/Allergies: Negative for environmental allergies. Does not bruise/bleed easily.  Psychiatric/Behavioral: Negative for depression. The patient is not nervous/anxious and does not have insomnia.   All other systems reviewed and are negative.    Objective:  Physical Exam  Constitutional: She is oriented to person, place, and time. She appears well-developed and well-nourished. No distress.  HENT:  Head: Normocephalic and atraumatic.  Mouth/Throat: Oropharynx is clear and moist.  Eyes: Pupils are equal, round, and reactive to light. Conjunctivae are normal. No scleral icterus.  Neck: Neck supple. No JVD present. No tracheal deviation present.  Cardiovascular: Normal rate, regular rhythm, normal heart sounds and intact distal pulses.  No murmur heard. Pulmonary/Chest: Effort normal and breath sounds normal. No accessory muscle usage or  stridor. No tachypnea. No respiratory distress. She has no wheezes. She has no rhonchi. She has no rales.  Abdominal: Soft. Bowel sounds are normal. She exhibits no distension. There is no tenderness.  Musculoskeletal: She exhibits no edema or tenderness.  Lymphadenopathy:    She has no cervical adenopathy.  Neurological: She is alert and oriented to person, place, and time.  Skin: Skin is warm and dry. Capillary refill takes less than 2 seconds. No rash noted.  Psychiatric: She has a normal mood and affect. Her behavior is normal.  Vitals reviewed.    Vitals:   10/07/18 0911  BP: 128/88  Pulse: 85  SpO2: 96%  Weight: (!) 315 lb 3.2 oz (143 kg)  Height: 5\' 10"  (1.778 m)   96% on RA BMI Readings from Last 3 Encounters:  10/07/18 45.23 kg/m  09/04/18 45.28 kg/m  08/17/18 44.85 kg/m   Wt Readings from Last 3 Encounters:  10/07/18 (!) 315 lb 3.2 oz (143 kg)  09/04/18 (!) 315 lb 9.6 oz (143.2 kg)  08/17/18 (!) 312 lb 9.6 oz (141.8 kg)     CBC    Component Value Date/Time   WBC 6.0 08/17/2018 0750   RBC 5.26 (H) 08/17/2018 0750   HGB 14.9 08/17/2018 0750   HCT 44.9 08/17/2018 0750   PLT 236.0 08/17/2018 0750   MCV 85.2 08/17/2018 0750   MCH 28.4 06/01/2018 1437   MCHC 33.3 08/17/2018 0750   RDW 13.7 08/17/2018 0750   LYMPHSABS 1.9 08/17/2018 0750   MONOABS 0.5 08/17/2018 0750   EOSABS 0.3 08/17/2018 0750   BASOSABS 0.1 08/17/2018 0750    Chest Imaging: Chest x-ray 05/31/2018: Ill-defined right lower lobe airspace disease. The patient's images have been independently reviewed by me.    CT coronary 07/28/2018: Groundglass densities of the lower lobes likely basilar atelectasis.  No significant infiltrate Small hiatal hernia, some esophageal wall thickening. The patient's images have been independently reviewed by me.     Pulmonary Functions Testing Results: No flowsheet data found.  FeNO: None   Pathology: None   Echocardiogram: None   Heart  Catheterization: None     Assessment & Plan:   Dyspnea, unspecified type - Plan: Pulmonary function test  Mild intermittent asthma  without complication  BMI 36.6-44.0, adult (HCC)  Seasonal allergies  Allergic sinusitis  Discussion: This is a 52 year old female past medical history of asthma diagnosed in her 47s.  Does have significant seasonal allergies currently managed with antihistamines.  She has a history of recurrent seasonal allergic sinusitis and rhinitis.  It does sound by her history that she is having more frequent exacerbations of asthma symptoms.  She has not been faithful with a daily maintenance inhaler in some time.  Also has stopped her Singulair.  We will make the following recommendations: First I think we can trial her on Symbicort.  We will give her a sample of this today.  I would like to see her using this daily for several weeks to see if this improves her symptoms.  She may be able to go to daily use during her peak seasons and then as needed use of Symbicort for intermittent symptoms.  New data would suggest that for mild intermittent asthma symptoms Symbicort as needed versus scheduled daily has similar exacerbation rate reduction.  She does need to continue to exercise and lose weight as obesity is a common trigger.  She should also restart her Singulair and continue her daily antihistamine regimen.  If her symptoms were to continue to worsen and having frequent exacerbations would recommend regional allergy panel and IgE testing.  Would like to see her using daily ICS/LABA prior to initiation of lama if she continues to have symptoms.  Patient is to call us if she is continuing to have worsening symptoms.  On return to clinic will have full PFTs.  As she has not had any baseline PFTs in greater than 20 years.  Greater than 50% of this patient 60-minute office was spent face-to-face counseling and discussing the above recommendations and treatment  plan.   Current Outpatient Medications:  .  albuterol (PROVENTIL HFA) 108 (90 Base) MCG/ACT inhaler, Inhale 2 puffs into the lungs every 6 (six) hours as needed for wheezing or shortness of breath., Disp: , Rfl:  .  albuterol (PROVENTIL) (2.5 MG/3ML) 0.083% nebulizer solution, Take 3 mLs (2.5 mg total) by nebulization every 6 (six) hours as needed for wheezing or shortness of breath., Disp: 75 mL, Rfl: 12 .  beclomethasone (QVAR REDIHALER) 80 MCG/ACT inhaler, Inhale 2 puffs into the lungs 2 (two) times daily., Disp: 1 Inhaler, Rfl: 0 .  cetirizine (ZYRTEC) 10 MG tablet, Take 10 mg by mouth daily as needed. , Disp: , Rfl:  .  ENSTILAR 0.005-0.064 % FOAM, APPLY 1 APPLICATION ON THE SKIN TWICE A DAY, Disp: , Rfl: 3 .  levonorgestrel (LILETTA, 52 MG,) 18.6 MCG/DAY IUD IUD, 1 each by Intrauterine route once., Disp: , Rfl:  .  metoprolol tartrate (LOPRESSOR) 25 MG tablet, Take 0.5 tablets (12.5 mg total) by mouth 2 (two) times daily. (Patient taking differently: Take 12.5 mg by mouth daily. ), Disp: 60 tablet, Rfl: 1 .  Multiple Vitamin (MULITIVITAMIN WITH MINERALS) TABS, Take 1 tablet by mouth daily., Disp: , Rfl:  .  rosuvastatin (CRESTOR) 10 MG tablet, Take 1 tablet (10 mg total) by mouth daily., Disp: 30 tablet, Rfl: 11 .  TACLONEX external suspension, Apply 1 application topically See admin instructions. Apply 1 application to the skin two times a day to psoriasis-affected areas, Disp: , Rfl: 3 .  tiZANidine (ZANAFLEX) 4 MG tablet, Take 1 tablet (4 mg total) by mouth every 6 (six) hours as needed for muscle spasms., Disp: 40 tablet, Rfl: 2   Sire Poet L  Vincenta Steffey, DO [facility] 10/07/2018 9:26 AM

## 2018-10-07 NOTE — Patient Instructions (Addendum)
Thank you for visiting Dr. Valeta Harms at [facility]. Today we recommend the following: Orders Placed This Encounter  Procedures  . Pulmonary function test   Meds ordered this encounter  Medications  . budesonide-formoterol (SYMBICORT) 160-4.5 MCG/ACT inhaler    Sig: Inhale 2 puffs into the lungs every 12 (twelve) hours.    Dispense:  2 Inhaler    Refill:  0   Please schedule PFT first available. We will call you with results of PFT.   Return in about 1 year (around 10/08/2019).

## 2018-10-16 MED FILL — ROSUVASTATIN CALCIUM 10 MG: 10 | 30 days supply | Qty: 30 | Fill #2

## 2018-11-17 ENCOUNTER — Ambulatory Visit (INDEPENDENT_AMBULATORY_CARE_PROVIDER_SITE_OTHER): Payer: No Typology Code available for payment source | Admitting: Family Medicine

## 2018-11-17 ENCOUNTER — Encounter: Payer: Self-pay | Admitting: Family Medicine

## 2018-11-17 VITALS — BP 122/80 | HR 64 | Temp 97.8°F | Ht 70.0 in | Wt 316.2 lb

## 2018-11-17 DIAGNOSIS — M25561 Pain in right knee: Secondary | ICD-10-CM | POA: Diagnosis not present

## 2018-11-17 DIAGNOSIS — F339 Major depressive disorder, recurrent, unspecified: Secondary | ICD-10-CM | POA: Insufficient documentation

## 2018-11-17 DIAGNOSIS — Z6841 Body Mass Index (BMI) 40.0 and over, adult: Secondary | ICD-10-CM

## 2018-11-17 DIAGNOSIS — I2510 Atherosclerotic heart disease of native coronary artery without angina pectoris: Secondary | ICD-10-CM

## 2018-11-17 DIAGNOSIS — Z9884 Bariatric surgery status: Secondary | ICD-10-CM | POA: Diagnosis not present

## 2018-11-17 DIAGNOSIS — E559 Vitamin D deficiency, unspecified: Secondary | ICD-10-CM

## 2018-11-17 DIAGNOSIS — R931 Abnormal findings on diagnostic imaging of heart and coronary circulation: Secondary | ICD-10-CM

## 2018-11-17 DIAGNOSIS — I499 Cardiac arrhythmia, unspecified: Secondary | ICD-10-CM

## 2018-11-17 DIAGNOSIS — I498 Other specified cardiac arrhythmias: Secondary | ICD-10-CM | POA: Insufficient documentation

## 2018-11-17 DIAGNOSIS — E049 Nontoxic goiter, unspecified: Secondary | ICD-10-CM

## 2018-11-17 DIAGNOSIS — L409 Psoriasis, unspecified: Secondary | ICD-10-CM

## 2018-11-17 DIAGNOSIS — E282 Polycystic ovarian syndrome: Secondary | ICD-10-CM

## 2018-11-17 LAB — CBC WITH DIFFERENTIAL/PLATELET
Basophils Absolute: 0.1 10*3/uL (ref 0.0–0.1)
Basophils Relative: 1.2 % (ref 0.0–3.0)
Eosinophils Absolute: 0.1 10*3/uL (ref 0.0–0.7)
Eosinophils Relative: 2.6 % (ref 0.0–5.0)
HCT: 43.7 % (ref 36.0–46.0)
Hemoglobin: 14.7 g/dL (ref 12.0–15.0)
Lymphocytes Relative: 39.9 % (ref 12.0–46.0)
Lymphs Abs: 2.3 10*3/uL (ref 0.7–4.0)
MCHC: 33.5 g/dL (ref 30.0–36.0)
MCV: 84.5 fl (ref 78.0–100.0)
Monocytes Absolute: 0.4 10*3/uL (ref 0.1–1.0)
Monocytes Relative: 7.1 % (ref 3.0–12.0)
Neutro Abs: 2.8 10*3/uL (ref 1.4–7.7)
Neutrophils Relative %: 49.2 % (ref 43.0–77.0)
Platelets: 214 10*3/uL (ref 150.0–400.0)
RBC: 5.18 Mil/uL — ABNORMAL HIGH (ref 3.87–5.11)
RDW: 13.9 % (ref 11.5–15.5)
WBC: 5.7 10*3/uL (ref 4.0–10.5)

## 2018-11-17 LAB — COMPREHENSIVE METABOLIC PANEL
ALT: 14 U/L (ref 0–35)
AST: 21 U/L (ref 0–37)
Albumin: 4 g/dL (ref 3.5–5.2)
Alkaline Phosphatase: 47 U/L (ref 39–117)
BUN: 10 mg/dL (ref 6–23)
CO2: 24 mEq/L (ref 19–32)
Calcium: 10.4 mg/dL (ref 8.4–10.5)
Chloride: 108 mEq/L (ref 96–112)
Creatinine, Ser: 0.75 mg/dL (ref 0.40–1.20)
GFR: 86.69 mL/min (ref >60.00)
Glucose, Bld: 96 mg/dL (ref 70–99)
Potassium: 3.8 mEq/L (ref 3.5–5.1)
Sodium: 139 mEq/L (ref 135–145)
Total Bilirubin: 0.6 mg/dL (ref 0.2–1.2)
Total Protein: 6.8 g/dL (ref 6.0–8.3)

## 2018-11-17 LAB — VITAMIN D 25 HYDROXY (VIT D DEFICIENCY, FRACTURES): VITD: 28.66 ng/mL — ABNORMAL LOW (ref 30.00–100.00)

## 2018-11-17 MED ORDER — PREDNISONE 5 MG PO TABS: 5.0000 mg | ORAL_TABLET | Freq: Every day | ORAL | 0 refills | Status: DC

## 2018-11-17 MED FILL — predniSONE 5 MG TABS: 5 | 30 days supply | Qty: 30 | Fill #0

## 2018-11-17 NOTE — Progress Notes (Signed)
Olivia [name] is a 53 y.o. female is here for follow up.  History of Present Illness:   HPI:   1. Acute pain of right knee, lateral, proximal fibula. No trauma. Worsened after travel. No back pain. No paresthesias. No treatment so far.     2. Lapband APS resited with [facility] repair June 2013, with weight regain. Working with Clinical research associate. On Metformin.     3. Depression, recurrent ([place]). Lots of stress lately. Full time work, husband had health scare.    4. Psoriasis. Flare. Causing significant discomfort. Trying to get back to old Dermatologist.     5. Vitamin D deficiency. Due for recheck.    Health Maintenance Due  Topic Date Due  . MAMMOGRAM  03/07/1986  . TETANUS/TDAP  03/08/1987  . PAP SMEAR-Modifier  03/07/1989   Depression screen [facility] 2/9 11/17/2018 09/04/2018 12/08/2017  Decreased Interest 0 0 0  Down, Depressed, Hopeless 0 0 2  PHQ - 2 Score 0 0 2  Altered sleeping 2 - 3  Tired, decreased energy 2 - 3  Change in appetite 1 - 1  Feeling bad or failure about yourself  1 - 3  Trouble concentrating 1 - 3  Moving slowly or fidgety/restless 0 - 3  Suicidal thoughts 0 - 0  PHQ-9 Score 7 - 18  Difficult doing work/chores Somewhat difficult - Somewhat difficult   PMHx, SurgHx, SocialHx, FamHx, Medications, and Allergies were reviewed in the Visit Navigator and updated as appropriate.   Patient Active Problem List   Diagnosis Date Noted  . CAD in native artery 11/18/2018  . Morbid obesity with BMI of 40.0-44.9, adult ([place]) 11/17/2018  . Ventricular Bigeminy 11/17/2018  . Depression, recurrent ([place]) 11/17/2018  . Hypercalcemia 08/19/2018  . Goiter, nodular 08/19/2018  . Atopic dermatitis 08/19/2018  . Elevated coronary artery calcium score, 90th percentile for age and gender 08/16/2018  . Vitamin D deficiency, on daily replacement 01/05/2018  . Psoriasis, clobetasol, referred to Dermatology 01/05/2018  . Chronic low back pain with sciatica 01/05/2018  . Hisotry of  microcytic anemia, high risk after bariatric surgery 12/07/2017  . IUD (intrauterine device) in place, Liletta 12/07/2017  . PCOS, Metformin, IUD 10/25/2014  . Migraine with aura 01/11/2014  . Lapband APS resited with [facility] repair June 2013 05/08/2012  . HLD (hyperlipidemia), on Crestor 04/15/2008  . Morbid obesity ([place]) 04/15/2008  . Allergic rhinitis, Zyrtec and Singulair 04/15/2008  . Mild intermittent asthma, albuterol prn 04/15/2008   Social History   Tobacco Use  . Smoking status: Never Smoker  . Smokeless tobacco: Never Used  Substance Use Topics  . Alcohol use: No  . Drug use: No   Current Medications and Allergies:   .  albuterol (PROVENTIL HFA) 108 (90 Base) MCG/ACT inhaler, Inhale 2 puffs into the lungs every 6 (six) hours as needed for wheezing or shortness of breath., Disp: , Rfl:  .  albuterol (PROVENTIL) (2.5 MG/3ML) 0.083% nebulizer solution, Take 3 mLs (2.5 mg total) by nebulization every 6 (six) hours as needed for wheezing or shortness of breath., Disp: 75 mL, Rfl: 12 .  beclomethasone (QVAR REDIHALER) 80 MCG/ACT inhaler, Inhale 2 puffs into the lungs 2 (two) times daily., Disp: 1 Inhaler, Rfl: 0 .  cetirizine (ZYRTEC) 10 MG tablet, Take 10 mg by mouth daily as needed. , Disp: , Rfl:  .  levonorgestrel (LILETTA, 52 MG,) 18.6 MCG/DAY IUD IUD, 1 each by Intrauterine route once., Disp: , Rfl:  .  metFORMIN (GLUCOPHAGE) 500 MG  tablet, Take 500-1,000 mg by mouth 2 (two) times daily with a meal., Disp: , Rfl:  .  metoprolol tartrate (LOPRESSOR) 25 MG tablet, Take 0.5 tablets (12.5 mg total) by mouth 2 (two) times daily. (Patient taking differently: Take 12.5 mg by mouth daily. ), Disp: 60 tablet, Rfl: 1 .  montelukast (SINGULAIR) 10 MG tablet, Take 1 tablet (10 mg total) by mouth at bedtime., Disp: 30 tablet, Rfl: 5 .  Multiple Vitamin (MULITIVITAMIN WITH MINERALS) TABS, Take 1 tablet by mouth daily., Disp: , Rfl:  .  rosuvastatin (CRESTOR) 10 MG tablet, Take 1 tablet (10 mg  total) by mouth daily., Disp: 30 tablet, Rfl: 11 .  TACLONEX external suspension, Apply 1 application topically See admin instructions. Apply 1 application to the skin two times a day to psoriasis-affected areas, Disp: , Rfl: 3 .  tiZANidine (ZANAFLEX) 4 MG tablet, Take 1 tablet (4 mg total) by mouth every 6 (six) hours as needed for muscle spasms., Disp: 40 tablet, Rfl: 2 .  predniSONE (DELTASONE) 5 MG tablet, Take 1 tablet (5 mg total) by mouth daily with breakfast., Disp: 30 tablet, Rfl: 0   Allergies  Allergen Reactions  . Cephalosporins Anaphylaxis and Other (See Comments)    Caused hypotension and a fever  . Eggs Or Egg-Derived Products Hives, Swelling and Other (See Comments)    Angioedema  . Penicillins Anaphylaxis    Has patient had a PCN reaction causing immediate rash, facial/tongue/throat swelling, SOB or lightheadedness with hypotension: Yes Has patient had a PCN reaction causing severe rash involving mucus membranes or skin necrosis: No Has patient had a PCN reaction that required hospitalization: Observed X12 hrs Has patient had a PCN reaction occurring within the last 10 years: No If all of the above answers are "NO", then may proceed with Cephalosporin use.   . Sulfa Drugs Cross Reactors Other (See Comments)    Liver failure  . Adhesive [Tape] Hives and Itching    Cannot wear for any length of time  . Effexor [Venlafaxine] Other (See Comments)    Lightheadedness, dizziness, and headaches resulted   Review of Systems   Pertinent items are noted in the HPI. Otherwise, a complete ROS is negative.  Vitals:   Vitals:   11/17/18 0729  BP: 122/80  Pulse: 64  Temp: 97.8 F (36.6 C)  TempSrc: Oral  SpO2: 94%  Weight: (!) 316 lb 4 oz (143.5 kg)  Height: 5\' 10"  (1.778 m)     Body mass index is 45.38 kg/m.  Physical Exam:   Physical Exam Vitals signs and nursing note reviewed.  HENT:     Head: Normocephalic and atraumatic.  Eyes:     Pupils: Pupils are equal,  round, and reactive to light.  Neck:     Musculoskeletal: Normal range of motion and neck supple.  Cardiovascular:     Rate and Rhythm: Normal rate and regular rhythm.     Heart sounds: Normal heart sounds.  Pulmonary:     Effort: Pulmonary effort is normal.  Abdominal:     Palpations: Abdomen is soft.  Skin:    General: Skin is warm.  Psychiatric:        Behavior: Behavior normal.     Assessment and Plan:   Teddy was seen today for follow-up.  Diagnoses and all orders for this visit:  Acute pain of right knee -     Ambulatory referral to Sports Medicine  Lapband APS resited with [facility] repair June 2013  Morbid obesity  with BMI of 40.0-44.9, adult (HCC) -     CBC with Differential/Platelet -     Comprehensive metabolic panel  Ventricular Bigeminy  Depression, recurrent (HCC)  Goiter, nodular -     Insulin, Free (Bioactive)  Elevated coronary artery calcium score, 90th percentile for age and gender  Psoriasis -     Ambulatory referral to Dermatology -     predniSONE (DELTASONE) 5 MG tablet; Take 1 tablet (5 mg total) by mouth daily with breakfast.  Vitamin D deficiency -     VITAMIN D 25 Hydroxy (Vit-D Deficiency, Fractures)  CAD in native artery -     Hepatic function panel  PCOS, Metformin, IUD -     Insulin, Free (Bioactive) -     Hepatic function panel   . Orders and follow up as documented in [place], reviewed diet, exercise and weight control, cardiovascular risk and specific lipid/LDL goals reviewed, reviewed medications and side effects in detail.  . Reviewed expectations re: course of current medical issues. . Outlined signs and symptoms indicating need for more acute intervention. . Patient verbalized understanding and all questions were answered. . Patient received an After Visit Summary.   Briscoe Deutscher, DO [place] 11/22/2018

## 2018-11-18 ENCOUNTER — Encounter: Payer: Self-pay | Admitting: Family Medicine

## 2018-11-18 DIAGNOSIS — I2510 Atherosclerotic heart disease of native coronary artery without angina pectoris: Secondary | ICD-10-CM | POA: Insufficient documentation

## 2018-11-18 LAB — HEPATIC FUNCTION PANEL
ALT: 17 IU/L (ref 0–32)
AST: 22 IU/L (ref 0–40)
Albumin: 4.1 g/dL (ref 3.5–5.5)
Alkaline Phosphatase: 54 IU/L (ref 39–117)
Bilirubin Total: 0.5 mg/dL (ref 0.0–1.2)
Bilirubin, Direct: 0.16 mg/dL (ref 0.00–0.40)
Total Protein: 6.4 g/dL (ref 6.0–8.5)

## 2018-11-19 ENCOUNTER — Ambulatory Visit: Payer: No Typology Code available for payment source | Admitting: Sports Medicine

## 2018-11-19 ENCOUNTER — Ambulatory Visit: Payer: Self-pay

## 2018-11-19 ENCOUNTER — Encounter: Payer: Self-pay | Admitting: Sports Medicine

## 2018-11-19 ENCOUNTER — Other Ambulatory Visit: Payer: Self-pay

## 2018-11-19 VITALS — BP 120/82 | HR 59 | Ht 70.0 in | Wt 309.2 lb

## 2018-11-19 DIAGNOSIS — M25561 Pain in right knee: Secondary | ICD-10-CM

## 2018-11-19 NOTE — Progress Notes (Signed)
Olivia [name]. Rigby, [place] at [place] - 53 y.o. female MRN [id number]  Date of birth: Sep 07, 1966  Visit Date: November 19, 2018  PCP: Briscoe Deutscher, DO   Referred by: Briscoe Deutscher, DO  SUBJECTIVE:  Chief Complaint  Patient presents with  . Initial Assessment    Referred by Dr. Briscoe Deutscher.   . R knee pain    Has tried Tylenol, IBU, Bengay, Arnica. Currently taking Prednisone. Fell 4 months ago at the gym. Worse with prolonged sitting, improves with movement. Pain is mostly lateral and non-radiating. Some R hip pain.     HPI: Patient is here with the above issues.  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  She has had subsequent improvement and has been taking steroid over the past 2 days with moderate improvement.  Pain is localized to the lateral knee.  REVIEW OF SYSTEMS: Denies fevers, chills, recent weight gain or weight loss.  No night sweats. No significant nighttime awakenings due to this issue.  HISTORY:  Prior history reviewed and updated per electronic medical record.  Social History   Occupational History  . Occupation: NP    Employer: [employer]  Tobacco Use  . Smoking status: Never Smoker  . Smokeless tobacco: Never Used  Substance and Sexual Activity  . Alcohol use: No  . Drug use: No  . Sexual activity: Yes    Partners: Male    Birth control/protection: I.U.D.    Comment: Vasectomy in spouse   Social History   Social History Narrative  . Not on file    Past Medical History:  Diagnosis Date  . Asthma   . Environmental allergies   . Hiatal hernia   . Hiatal hernia without gangrene or obstruction 08/19/2018  . HLD (hyperlipidemia) 04/15/2008  . Microcytic anemia   . Migraine, hormonal   . PACs (premature atrial  contraction)   . PCOS (polycystic ovarian syndrome) 10/25/2014  . PONV (postoperative nausea and vomiting)   . Reflux   . Seasonal allergies      Past Surgical History:  Procedure Laterality Date  . HIATAL HERNIA REPAIR  04/14/2012   Procedure: LAPAROSCOPIC REPAIR OF HIATAL HERNIA;  Surgeon: Pedro Earls, MD;  Location: [facility];  Service: General;  Laterality: N/A;  Hiatal Hernia Repair-Replacement of Lap Band  . LAPAROSCOPIC GASTRIC BANDING  08/08/2008  . TONSILLECTOMY  1981    family history includes Colon cancer in her mother; Colon polyps in her mother; Diabetes in her mother; Hypertension in her father; Obesity in her father, mother, and sister.  DATA OBTAINED & REVIEWED:  Recent Labs    12/08/17 0825  06/01/18 1633 07/08/18 1210 08/17/18 0750 11/17/18 0830  HGBA1C 5.5  --   --   --   --   --   CALCIUM 9.9   < >  --  10.6* 10.7* 10.4  AST 14  --   --   --  16 22  21   ALT 11  --   --   --  14 17  14   TSH 2.21  --  0.935  --   --   --    < > = values in this interval not displayed.   No problems updated. No specialty  comments available.   OBJECTIVE:  VS:  HT:5\' 10"  (177.8 cm)   WT: (!) 309 lb 3.2 oz (140.3 kg)  BMI:44.37    BP:120/82  HR:(!) 59bpm  TEMP: ( )  RESP:96 %   PHYSICAL EXAM: Well-developed, Well-nourished and In no acute distress Pupils are equal., EOM intact without nystagmus. and No scleral icterus. Alert & appropriately interactive. and Not depressed or anxious appearing. Warm and well perfused Right knee: Overall generally well aligned.  She is a large soft tissue envelope.  Lateral joint line pain as well as just deep to IT band at its lateral femoral condyle crossing.  She is ligamentously stable.  Extensor.  No significant pain with McMurray's.  No true lower extremity swelling edema.   ASSESSMENT  1. Right knee pain, unspecified chronicity      PROCEDURES:  US Guided Injection per procedure note  PLAN:  Pertinent additional  documentation may be included in corresponding procedure notes, imaging studies, problem based documentation and patient instructions.  No problem-specific Assessment & Plan notes found for this encounter. Symptoms are consistent with lateral meniscus/Konda friction syndrome.  We did perform a directed ultrasound display this should provide her if she is looking for.  She had only minimal degenerative changes.  Continue with home therapeutic exercises that she is already performing with her personal trainer.  Okay to resume these activities later in the week.  If any lack of improvement further diagnostic evaluation with x-ray will be recommended.  Activity modifications and the importance of avoiding exacerbating activities (limiting pain to no more than a 4 / 10 during or following activity) recommended and discussed. Discussed red flag symptoms that warrant earlier emergent evaluation and patient voices understanding. No orders of the defined types were placed in this encounter. Lab Orders  No laboratory test(s) ordered today   Imaging Orders  [place] MSK POCT ULTRASOUND  Referral Orders  No referral(s) requested today   Return in about 6 weeks (around 12/31/2018).          Gerda Diss, [place] [facility] Physician

## 2018-11-19 NOTE — Patient Instructions (Signed)

## 2018-11-19 NOTE — Procedures (Signed)
PROCEDURE NOTE:  Ultrasound Guided: Injection: Right knee Images were obtained and interpreted by myself, Teresa Coombs, DO  Images have been saved and stored to PACS system. Images obtained on: GE S7 Ultrasound machine    ULTRASOUND FINDINGS:  She has mild degenerative spurring over the femur which is minimal..  There is some fluid is tracking deep to the IT band just prior to the lateral meniscus.  This is a focal area of pain that she has.  Insertion of the IT band is normal.  Normal appearing quadriceps tendon.  Small supraphysiologic effusion.  DESCRIPTION OF PROCEDURE:  The patient's clinical condition is marked by substantial pain and/or significant functional disability. Other conservative therapy has not provided relief, is contraindicated, or not appropriate. There is a reasonable likelihood that injection will significantly improve the patient's pain and/or functional impairment.   After discussing the risks, benefits and expected outcomes of the injection and all questions were reviewed and answered, the patient wished to undergo the above named procedure.  Verbal consent was obtained.  The ultrasound was used to identify the target structure and adjacent neurovascular structures. The skin was then prepped in sterile fashion and the target structure was injected under direct visualization using sterile technique as below:  Single injection performed as below: PREP: Alcohol and Ethel Chloride APPROACH:lateral direct, single injection, 25g 1.5 in. INJECTATE: 2 cc 0.5% Marcaine and 1 cc 40mg /mL DepoMedrol ASPIRATE: None DRESSING: Band-Aid  Post procedural instructions including recommending icing and warning signs for infection were reviewed.    This procedure was well tolerated and there were no complications.   IMPRESSION: Succesful Ultrasound Guided: Injection

## 2018-11-20 ENCOUNTER — Ambulatory Visit (INDEPENDENT_AMBULATORY_CARE_PROVIDER_SITE_OTHER): Payer: No Typology Code available for payment source | Admitting: Pulmonary Disease

## 2018-11-20 DIAGNOSIS — R0600 Dyspnea, unspecified: Secondary | ICD-10-CM | POA: Diagnosis not present

## 2018-11-20 LAB — PULMONARY FUNCTION TEST
DL/VA % pred: 93 %
DL/VA: 4.88 ml/min/mmHg/L
DLCO cor % pred: 82 %
DLCO cor: 24.33 ml/min/mmHg
DLCO unc % pred: 85 %
DLCO unc: 25.25 ml/min/mmHg
FEF 25-75 Post: 2.9 L/sec
FEF 25-75 Pre: 3.54 L/sec
FEF2575-%Change-Post: -18 %
FEF2575-%Pred-Post: 96 %
FEF2575-%Pred-Pre: 118 %
FEV1-%Change-Post: -4 %
FEV1-%Pred-Post: 88 %
FEV1-%Pred-Pre: 93 %
FEV1-POST: 2.84 L
FEV1-Pre: 2.99 L
FEV1FVC-%Change-Post: 5 %
FEV1FVC-%Pred-Pre: 106 %
FEV6-%Change-Post: -9 %
FEV6-%PRED-PRE: 88 %
FEV6-%Pred-Post: 80 %
FEV6-Post: 3.16 L
FEV6-Pre: 3.49 L
FEV6FVC-%Pred-Post: 102 %
FEV6FVC-%Pred-Pre: 102 %
FVC-%Change-Post: -9 %
FVC-%Pred-Post: 78 %
FVC-%Pred-Pre: 86 %
FVC-Post: 3.16 L
FVC-Pre: 3.5 L
PRE FEV6/FVC RATIO: 100 %
Post FEV1/FVC ratio: 90 %
Post FEV6/FVC ratio: 100 %
Pre FEV1/FVC ratio: 85 %
RV % PRED: 74 %
RV: 1.49 L
TLC % PRED: 88 %
TLC: 4.99 L

## 2018-11-20 NOTE — Progress Notes (Signed)
PFT done today. 

## 2018-11-22 ENCOUNTER — Encounter: Payer: Self-pay | Admitting: Family Medicine

## 2018-11-24 ENCOUNTER — Other Ambulatory Visit: Payer: Self-pay | Admitting: Family Medicine

## 2018-11-24 DIAGNOSIS — E282 Polycystic ovarian syndrome: Secondary | ICD-10-CM

## 2018-11-24 LAB — INSULIN, FREE (BIOACTIVE): Insulin, Free: 9.9 u[IU]/mL (ref 1.5–14.9)

## 2018-11-24 MED ORDER — METFORMIN HCL 500 MG PO TABS: 500.0000 mg | ORAL_TABLET | Freq: Two times a day (BID) | ORAL | 1 refills | Status: DC

## 2018-11-24 MED FILL — ROSUVASTATIN CALCIUM 10 MG: 10 | 90 days supply | Qty: 90 | Fill #3

## 2018-11-24 MED FILL — METOPROLOL TARTRATE 25 MG T: 25 | 60 days supply | Qty: 60 | Fill #1

## 2018-11-24 MED FILL — MONTELUKAST SOD 10 MG TAB: 10 | 90 days supply | Qty: 90 | Fill #1

## 2018-11-24 MED FILL — metFORMIN HCL 500 MG TABS: 500 | 45 days supply | Qty: 180 | Fill #0

## 2018-11-24 NOTE — Telephone Encounter (Signed)
Copied from [employer]. Topic: Quick Communication - Rx Refill/Question >> Nov 24, 2018 12:13 PM Alfredia Ferguson R wrote: Medication: metFORMIN (GLUCOPHAGE) 500 MG tablet 90day supply ; also would like to try 750ER tablets for 30days  Has the patient contacted their pharmacy? Yes  Preferred Pharmacy (with phone number or street name): [place], [place]. [phone] (Phone) [phone] (Fax)    Agent: Please be advised that RX refills may take up to 3 business days. We ask that you follow-up with your pharmacy.

## 2018-11-24 NOTE — Telephone Encounter (Signed)
See note

## 2018-11-24 NOTE — Telephone Encounter (Signed)
Will send note to office re: pt's request to try Metformin 750 mg., ER tablets x 30 days.

## 2018-11-24 NOTE — Telephone Encounter (Signed)
Requested medication (s) are due for refill today:  yes  Requested medication (s) are on the active medication list:  yes  Future visit scheduled:  no  Last Refill: Historical provider for Metformin 500 mg; take 1-2 tablets BID with meal / requests 90 day supply  *Note that pt. is also requesting to try Metformin 750 mg. ER tablet x 30 days.                      Requested Prescriptions  Pending Prescriptions Disp Refills   metFORMIN (GLUCOPHAGE) 500 MG tablet 180 tablet 1    Sig: Take 1-2 tablets (500-1,000 mg total) by mouth 2 (two) times daily with a meal.     Endocrinology:  Diabetes - Biguanides Failed - 11/24/2018 12:24 PM      Failed - HBA1C is between 0 and 7.9 and within 180 days    Hgb A1c MFr Bld  Date Value Ref Range Status  12/08/2017 5.5 4.6 - 6.5 % Final    Comment:    Glycemic Control Guidelines for People with Diabetes:Non Diabetic:  <6%Goal of Therapy: <7%Additional Action Suggested:  >8%          Passed - Cr in normal range and within 360 days    Creatinine, Ser  Date Value Ref Range Status  11/17/2018 0.75 0.40 - 1.20 mg/dL Final         Passed - eGFR in normal range and within 360 days    GFR calc Af Amer  Date Value Ref Range Status  07/08/2018 106 >59 mL/min/1.73 Final   GFR calc non Af Amer  Date Value Ref Range Status  07/08/2018 92 >59 mL/min/1.73 Final   GFR  Date Value Ref Range Status  11/17/2018 86.69 >60.00 mL/min Final         Passed - Valid encounter within last 6 months    Recent Outpatient Visits          5 days ago Right knee pain, unspecified chronicity   [facility], Legrand Como D, DO   1 week ago Acute pain of right knee   [place], [place], DO   2 months ago Skin lesion   [place] Wallace, [place], DO   3 months ago PCOS, Metformin, IUD   [place] Wallace, [place], DO   7 months ago Morbid obesity [facility])   [place] Wallace, Danae Chen, DO      Future Appointments            In 1 month Gerda Diss, DO [place], [facility]

## 2018-12-31 ENCOUNTER — Encounter: Payer: Self-pay | Admitting: Sports Medicine

## 2018-12-31 ENCOUNTER — Ambulatory Visit: Payer: No Typology Code available for payment source | Admitting: Sports Medicine

## 2018-12-31 ENCOUNTER — Ambulatory Visit (INDEPENDENT_AMBULATORY_CARE_PROVIDER_SITE_OTHER): Payer: No Typology Code available for payment source

## 2018-12-31 VITALS — BP 112/84 | HR 77 | Ht 70.0 in | Wt 307.2 lb

## 2018-12-31 DIAGNOSIS — M25561 Pain in right knee: Secondary | ICD-10-CM

## 2018-12-31 DIAGNOSIS — G8929 Other chronic pain: Secondary | ICD-10-CM

## 2018-12-31 MED ORDER — DICLOFENAC SODIUM 1 % TD GEL: TRANSDERMAL | 1 refills | Status: DC

## 2018-12-31 MED FILL — DICLOFENAC SODIUM 1 % GEL: 1 | 25 days supply | Qty: 100 | Fill #0

## 2018-12-31 NOTE — Progress Notes (Signed)
Olivia [name]. Rigby, [place] at [place] - 53 y.o. female MRN [id number]  Date of birth: 1966-08-31  Visit Date: January 03, 2019  PCP: Briscoe Deutscher, DO   Referred by: Briscoe Deutscher, DO  SUBJECTIVE:  Chief Complaint  Patient presents with  . Right Knee - Follow-up    Corticosteroid injection 11/19/2018. IBU, Tylenol, Bengay, Arnica. Completed Prednisone.     HPI: Patient is here for follow-up of right knee pain.  She is having some intermittent nighttime disturbances as well as slight right lower extremity swelling.  She has moderate improvement in her symptoms following injection but is continued to have occasional 8-9 out of 10 pain with occasional giving way but no falls.  Is worse with prolonged sitting.  She does report a positive theater sign.  Its slightly worsened on stairs and hills.  Rest and heat are beneficial.  She is continue to use BenGay, Arnica gel and previously taken prednisone.  Ibuprofen and Tylenol have been only minimally helpful.    REVIEW OF SYSTEMS: Denies fevers, chills, recent weight gain or weight loss.  No night sweats.  Pt denies any change in bowel or bladder habits, muscle weakness, numbness or falls associated with this pain.  HISTORY:  Prior history reviewed and updated per electronic medical record.  Patient Active Problem List   Diagnosis Date Noted  . CAD in native artery 11/18/2018  . Morbid obesity with BMI of 40.0-44.9, adult ([place]) 11/17/2018  . Ventricular Bigeminy 11/17/2018    2019: She's been working out with a Physiological scientist to try to achieve weight loss. After a workout last month, she experienced increased palpitations and shortness of breath. She rested and pushed fluids, symptoms improved over several hours. She was initially evaluated at [facility] and was noted to have ventricular bigeminy. 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 She developed recurrent symptoms the following day and was evaluated in the [facility] ED. Formal cardiology consultation was done by Dr Percival Spanish.  EKG and cardiac enzymes were negative.  Her initial EKG from the previous day demonstrated ventricular bigeminy.  She continues to have palpitations and associated chest pressure. She's been unable to tolerate beta-blockers even at low dose because of bradycardia even with very low doses of metoprolol.   . Depression, recurrent ([place]) 11/17/2018  . Hypercalcemia 08/19/2018  . Goiter, nodular 08/19/2018    2016 IMPRESSION: Multinodular goiter with multiple bilateral thyroid nodules. These are generally round and hypoechoic. There is a mildly complex cyst in the right lobe measuring up to 1.1 cm. The largest solid nodule is in the inferior right lobe measures 1.8 x 1.1 x 1.4 cm. This could either be sampled or followed by ultrasound.   . Atopic dermatitis 08/19/2018  . Elevated coronary artery calcium score, 90th percentile for age and gender 08/16/2018    IMPRESSION: 1. Coronary artery calcium score 9.5 Agatston units. This places the patient in the 90th percentile for age and gender. This suggests high risk for future cardiac events. 2. This study is limited by artifact. However, there only appears tbe plaque in the proximal LAD. There is no more than mild stenosis.   . Vitamin D deficiency, on daily replacement 01/05/2018  . Psoriasis, clobetasol, referred to Dermatology 01/05/2018  . Chronic low back pain with sciatica 01/05/2018  . Hisotry of microcytic anemia, high risk after bariatric surgery  12/07/2017  . IUD (intrauterine device) in place, [place] 12/07/2017  . PCOS, Metformin, IUD 10/25/2014  . Migraine with aura 01/11/2014  . Lapband APS resited with [facility] repair June 2013 05/08/2012    CT 11/19: Small hiatal hernia. Distal circumferential esophageal wall thickening may reflect esophagitis. Lost the 100 pounds from  the surgery and then gained it back.    Marland Kitchen HLD (hyperlipidemia), on Crestor 04/15/2008  . Morbid obesity ([place]) 04/15/2008  . Allergic rhinitis, Zyrtec and Singulair 04/15/2008  . Mild intermittent asthma, albuterol prn 04/15/2008   Social History   Occupational History  . Occupation: NP    Employer:   Tobacco Use  . Smoking status: Never Smoker  . Smokeless tobacco: Never Used  Substance and Sexual Activity  . Alcohol use: No  . Drug use: No  . Sexual activity: Yes    Partners: Male    Birth control/protection: I.U.D.    Comment: Vasectomy in spouse   Social History   Social History Narrative  . Not on file    OBJECTIVE:  VS:  HT:5\' 10"  (177.8 cm)   WT:(!) 307 lb 3.2 oz (139.3 kg)  BMI:44.08    BP:112/84  HR:77bpm  TEMP: ( )  RESP:97 %   PHYSICAL EXAM: Adult female. No acute distress.  Alert and appropriate. Right knee is well aligned with a large soft tissue envelope.  She has a small amount of swelling especially over the anterior medial surface.  Her knee is ligamentously stable but slightly painful with valgus testing.  Slight pain with McMurray's.  She does have a positive Thessaly.  No clicking with Thessaly or McMurray's but with flexion extension there is a small nonreproducible click.   ASSESSMENT:  1. Right knee pain, unspecified chronicity   2. Chronic pain of right knee     PROCEDURES:  None  PLAN:  Pertinent additional documentation may be included in corresponding procedure notes, imaging studies, problem based documentation and patient instructions.  No problem-specific Assessment & Plan notes found for this encounter.   Ultimately given the ongoing symptoms and only minimal degenerative change on x-ray further diagnostic evaluation with MRI recommended.  Continue previously prescribed home exercise program.   Activity modifications and the importance of avoiding exacerbating activities (limiting pain to no more than a 4 / 10 during  or following activity) recommended and discussed.  Discussed red flag symptoms that warrant earlier emergent evaluation and patient voices understanding.   Meds ordered this encounter  Medications  . diclofenac sodium (VOLTAREN) 1 % GEL    Sig: Apply topically to affected area qid    Dispense:  100 g    Refill:  1   Lab Orders  No laboratory test(s) ordered today    Imaging Orders     DG Knee AP/LAT W/Sunrise Right     MR Knee Right Wo Contrast Referral Orders  No referral(s) requested today    Return for MRI results review in office.          Gerda Diss, [facility] Physician

## 2018-12-31 NOTE — Patient Instructions (Addendum)
We are ordering an MRI for you today.  The imaging office will be calling you to schedule your appointment after we obtain authorization from your insurance company.   Please be sure you have signed up for MyChart so that we can get your results to you.  We will be in touch with you as soon as we can.  Please know, it can take up to 3-4 business days for the radiologist and Dr. Paulla Fore to have time to review the results and determine the best appropriate action.  If there is something that appears to be surgical or needs a referral to other specialists we will let you know through [place] or telephone.  Otherwise we will plan to schedule a follow up appointment with Dr. Paulla Fore once we have the results.    [facility]: [phone]

## 2019-01-03 ENCOUNTER — Encounter: Payer: Self-pay | Admitting: Sports Medicine

## 2019-01-16 ENCOUNTER — Ambulatory Visit
Admission: RE | Admit: 2019-01-16 | Discharge: 2019-01-16 | Disposition: A | Discharge status: Home / Self Care | Payer: No Typology Code available for payment source | Source: Ambulatory Visit | Attending: Sports Medicine | Admitting: Sports Medicine

## 2019-01-16 ENCOUNTER — Other Ambulatory Visit: Payer: Self-pay

## 2019-01-16 DIAGNOSIS — G8929 Other chronic pain: Secondary | ICD-10-CM

## 2019-01-16 DIAGNOSIS — M25561 Pain in right knee: Principal | ICD-10-CM

## 2019-01-21 ENCOUNTER — Encounter: Payer: Self-pay | Admitting: Sports Medicine

## 2019-01-21 NOTE — Progress Notes (Signed)
See phone note

## 2019-01-26 ENCOUNTER — Other Ambulatory Visit: Payer: Self-pay | Admitting: Family Medicine

## 2019-01-26 MED FILL — metFORMIN HCL 500 MG TABS: 500 | 45 days supply | Qty: 180 | Fill #0

## 2019-01-26 MED FILL — METOPROLOL TARTRATE 25 MG T: 25 | 60 days supply | Qty: 60 | Fill #0

## 2019-01-26 NOTE — Telephone Encounter (Signed)
Last OV 11/17/2018 Last refill 09/16/2018 #60/1 Next OV not scheduled

## 2019-01-27 ENCOUNTER — Encounter: Payer: Self-pay | Admitting: Family Medicine

## 2019-01-27 MED ORDER — VITAMIN D (ERGOCALCIFEROL) 1.25 MG (50000 UNIT) PO CAPS: 50000.0000 [IU] | ORAL_CAPSULE | ORAL | 0 refills | Status: DC

## 2019-02-11 ENCOUNTER — Other Ambulatory Visit: Payer: Self-pay | Admitting: Family Medicine

## 2019-02-11 DIAGNOSIS — E282 Polycystic ovarian syndrome: Secondary | ICD-10-CM

## 2019-02-11 MED FILL — metFORMIN HCL 500 MG TABS: 500 | 90 days supply | Qty: 360 | Fill #0

## 2019-02-11 MED FILL — VIT D2 1.25 MG (50,000 UNIT: 1.25 MG | 84 days supply | Qty: 6 | Fill #0

## 2019-02-11 MED FILL — ROSUVASTATIN CALCIUM 10 MG: 10 | 90 days supply | Qty: 90 | Fill #0

## 2019-02-11 MED FILL — MONTELUKAST SOD 10 MG TAB: 10 | 30 days supply | Qty: 30 | Fill #0

## 2019-02-11 NOTE — Telephone Encounter (Signed)
Last OV 11/17/2018 Last refill 11/24/2018 #180/1 Next OV not scheduled

## 2019-03-15 ENCOUNTER — Telehealth: Payer: Self-pay | Admitting: Pulmonary Disease

## 2019-03-15 ENCOUNTER — Other Ambulatory Visit: Payer: Self-pay | Admitting: Family Medicine

## 2019-03-15 MED ORDER — ALBUTEROL SULFATE HFA 108 (90 BASE) MCG/ACT IN AERS: 2.0000 | INHALATION_SPRAY | Freq: Four times a day (QID) | RESPIRATORY_TRACT | 2 refills | Status: DC | PRN

## 2019-03-15 MED FILL — MONTELUKAST SOD 10 MG TAB: 10 | 90 days supply | Qty: 90 | Fill #0

## 2019-03-15 MED FILL — ALBUTEROL SULFATE HFA 108 (: 108 (90 BAS | 25 days supply | Qty: 18 | Fill #0

## 2019-03-15 NOTE — Telephone Encounter (Signed)
Called patient to see which albuterol she needed. Inhaler or neb albuterol. Pt requesting inhaler which has been sent per her request. Nothing further needed.

## 2019-03-18 ENCOUNTER — Other Ambulatory Visit: Payer: Self-pay

## 2019-03-18 MED ORDER — MONTELUKAST SODIUM 10 MG PO TABS: 10.0000 mg | ORAL_TABLET | Freq: Every day | ORAL | 2 refills | Status: DC

## 2019-04-15 ENCOUNTER — Encounter: Payer: Self-pay | Admitting: Family Medicine

## 2019-04-16 NOTE — Telephone Encounter (Signed)
Olivia [name] please see message and advise if okay for virtual visit.

## 2019-04-16 NOTE — Telephone Encounter (Signed)
Called pt and offered an appt today with [facility] but pt wanted to schedule with Dr. Juleen [name]. Appt scheduled for next Tues.

## 2019-04-19 NOTE — Progress Notes (Deleted)
Virtual Visit via Video   Due to the COVID-19 pandemic, this visit was completed with telemedicine (audio/video) technology to reduce patient and provider exposure as well as to preserve personal protective equipment.   I connected with Olivia [name] by a video enabled telemedicine application and verified that I am speaking with the correct person using two identifiers. Location patient: Home Location provider: [facility], Office Persons participating in the virtual visit: AYBREE LANYON, Briscoe Deutscher, DO   I discussed the limitations of evaluation and management by telemedicine and the availability of in person appointments. The patient expressed understanding and agreed to proceed.  Care Team   Patient Care Team: Briscoe Deutscher, DO as PCP - General (Family Medicine) Erline Levine, MD as Consulting Physician (Neurosurgery) Valeta Harms Octavio Graves, DO as Consulting Physician (Pulmonary Disease) Sherren Mocha, MD as Consulting Physician (Cardiology) Danella Sensing, MD as Consulting Physician (Dermatology) Aloha Gell, MD as Consulting Physician (Obstetrics and Gynecology)  Subjective:   HPI:   ROS   Patient Active Problem List   Diagnosis Date Noted  . CAD in native artery 11/18/2018  . Morbid obesity with BMI of 40.0-44.9, adult ([place]) 11/17/2018  . Ventricular Bigeminy 11/17/2018  . Depression, recurrent ([place]) 11/17/2018  . Hypercalcemia 08/19/2018  . Goiter, nodular 08/19/2018  . Atopic dermatitis 08/19/2018  . Elevated coronary artery calcium score, 90th percentile for age and gender 08/16/2018  . Vitamin D deficiency, on daily replacement 01/05/2018  . Psoriasis, clobetasol, referred to Dermatology 01/05/2018  . Chronic low back pain with sciatica 01/05/2018  . Hisotry of microcytic anemia, high risk after bariatric surgery 12/07/2017  . IUD (intrauterine device) in place, Liletta 12/07/2017  . PCOS, Metformin, IUD 10/25/2014  . Migraine with aura 01/11/2014   . Lapband APS resited with [facility] repair June 2013 05/08/2012  . HLD (hyperlipidemia), on Crestor 04/15/2008  . Morbid obesity ([place]) 04/15/2008  . Allergic rhinitis, Zyrtec and Singulair 04/15/2008  . Mild intermittent asthma, albuterol prn 04/15/2008    Social History   Tobacco Use  . Smoking status: Never Smoker  . Smokeless tobacco: Never Used  Substance Use Topics  . Alcohol use: No    Current Outpatient Medications:  .  albuterol (PROVENTIL HFA) 108 (90 Base) MCG/ACT inhaler, Inhale 2 puffs into the lungs every 6 (six) hours as needed for wheezing or shortness of breath., Disp: 18 g, Rfl: 2 .  albuterol (PROVENTIL) (2.5 MG/3ML) 0.083% nebulizer solution, Take 3 mLs (2.5 mg total) by nebulization every 6 (six) hours as needed for wheezing or shortness of breath., Disp: 75 mL, Rfl: 12 .  beclomethasone (QVAR REDIHALER) 80 MCG/ACT inhaler, Inhale 2 puffs into the lungs 2 (two) times daily., Disp: 1 Inhaler, Rfl: 0 .  cetirizine (ZYRTEC) 10 MG tablet, Take 10 mg by mouth daily as needed. , Disp: , Rfl:  .  diclofenac sodium (VOLTAREN) 1 % GEL, Apply topically to affected area qid, Disp: 100 g, Rfl: 1 .  levonorgestrel (LILETTA, 52 MG,) 18.6 MCG/DAY IUD IUD, 1 each by Intrauterine route once., Disp: , Rfl:  .  metFORMIN (GLUCOPHAGE) 500 MG tablet, TAKE 1-2 TABLETS BY MOUTH 2 TIMES DAILY WITH A MEAL., Disp: 360 tablet, Rfl: 0 .  metoprolol tartrate (LOPRESSOR) 25 MG tablet, TAKE 1/2 TABLET BY MOUTH 2 TIMES DAILY or as directed., Disp: 60 tablet, Rfl: 1 .  montelukast (SINGULAIR) 10 MG tablet, Take 1 tablet (10 mg total) by mouth at bedtime., Disp: 90 tablet, Rfl: 2 .  Multiple Vitamin (MULITIVITAMIN  WITH MINERALS) TABS, Take 1 tablet by mouth daily., Disp: , Rfl:  .  rosuvastatin (CRESTOR) 10 MG tablet, Take 1 tablet (10 mg total) by mouth daily., Disp: 30 tablet, Rfl: 11 .  TACLONEX external suspension, Apply 1 application topically See admin instructions. Apply 1 application to the skin two  times a day to psoriasis-affected areas, Disp: , Rfl: 3 .  tiZANidine (ZANAFLEX) 4 MG tablet, Take 1 tablet (4 mg total) by mouth every 6 (six) hours as needed for muscle spasms., Disp: 40 tablet, Rfl: 2 .  Vitamin D, Ergocalciferol, (DRISDOL) 1.25 MG (50000 UT) CAPS capsule, Take 1 capsule (50,000 Units total) by mouth every 14 (fourteen) days., Disp: 6 capsule, Rfl: 0  Allergies  Allergen Reactions  . Cephalosporins Anaphylaxis and Other (See Comments)    Caused hypotension and a fever  . Eggs Or Egg-Derived Products Hives, Swelling and Other (See Comments)    Angioedema  . Penicillins Anaphylaxis    Has patient had a PCN reaction causing immediate rash, facial/tongue/throat swelling, SOB or lightheadedness with hypotension: Yes Has patient had a PCN reaction causing severe rash involving mucus membranes or skin necrosis: No Has patient had a PCN reaction that required hospitalization: Observed X12 hrs Has patient had a PCN reaction occurring within the last 10 years: No If all of the above answers are "NO", then may proceed with Cephalosporin use.   . Sulfa Drugs Cross Reactors Other (See Comments)    Liver failure  . Adhesive [Tape] Hives and Itching    Cannot wear for any length of time  . Effexor [Venlafaxine] Other (See Comments)    Lightheadedness, dizziness, and headaches resulted    Objective:   VITALS: Per patient if applicable, see vitals. GENERAL: Alert, appears well and in no acute distress. HEENT: Atraumatic, conjunctiva clear, no obvious abnormalities on inspection of external nose and ears. NECK: Normal movements of the head and neck. CARDIOPULMONARY: No increased WOB. Speaking in clear sentences. I:E ratio WNL.  MS: Moves all visible extremities without noticeable abnormality. PSYCH: Pleasant and cooperative, well-groomed. Speech normal rate and rhythm. Affect is appropriate. Insight and judgement are appropriate. Attention is focused, linear, and appropriate.    NEURO: CN grossly intact. Oriented as arrived to appointment on time with no prompting. Moves both UE equally.  SKIN: No obvious lesions, wounds, erythema, or cyanosis noted on face or hands.  Depression screen [facility] 2/9 11/17/2018 09/04/2018 12/08/2017  Decreased Interest 0 0 0  Down, Depressed, Hopeless 0 0 2  PHQ - 2 Score 0 0 2  Altered sleeping 2 - 3  Tired, decreased energy 2 - 3  Change in appetite 1 - 1  Feeling bad or failure about yourself  1 - 3  Trouble concentrating 1 - 3  Moving slowly or fidgety/restless 0 - 3  Suicidal thoughts 0 - 0  PHQ-9 Score 7 - 18  Difficult doing work/chores Somewhat difficult - Somewhat difficult    Assessment and Plan:   There are no diagnoses linked to this encounter.  Marland Kitchen COVID-19 Education: The signs and symptoms of COVID-19 were discussed with the patient and how to seek care for testing if needed. The importance of social distancing was discussed today. . Reviewed expectations re: course of current medical issues. . Discussed self-management of symptoms. . Outlined signs and symptoms indicating need for more acute intervention. . Patient verbalized understanding and all questions were answered. Marland Kitchen Health Maintenance issues including appropriate healthy diet, exercise, and smoking avoidance were discussed with  patient. . See orders for this visit as documented in the electronic medical record.  Briscoe Deutscher, DO  Records requested if needed. Time spent: *** minutes, of which >50% was spent in obtaining information about her symptoms, reviewing her previous labs, evaluations, and treatments, counseling her about her condition (please see the discussed topics above), and developing a plan to further investigate it; she had a number of questions which I addressed.

## 2019-04-20 ENCOUNTER — Other Ambulatory Visit: Payer: Self-pay

## 2019-04-20 ENCOUNTER — Ambulatory Visit: Payer: No Typology Code available for payment source | Admitting: Family Medicine

## 2019-04-20 MED ORDER — METRONIDAZOLE 500 MG PO TABS: 500.0000 mg | ORAL_TABLET | Freq: Two times a day (BID) | ORAL | 0 refills | Status: DC

## 2019-04-20 MED FILL — metroNIDAZOLE 500 MG TABS: 500 | 7 days supply | Qty: 14 | Fill #0

## 2019-04-26 ENCOUNTER — Other Ambulatory Visit: Payer: Self-pay | Admitting: Family Medicine

## 2019-04-26 DIAGNOSIS — E282 Polycystic ovarian syndrome: Secondary | ICD-10-CM

## 2019-04-26 MED FILL — METOPROLOL TARTRATE 25 MG T: 25 | 60 days supply | Qty: 60 | Fill #0

## 2019-04-30 ENCOUNTER — Other Ambulatory Visit: Payer: Self-pay | Admitting: Family Medicine

## 2019-04-30 DIAGNOSIS — E282 Polycystic ovarian syndrome: Secondary | ICD-10-CM

## 2019-06-11 MED FILL — ROSUVASTATIN CALCIUM 10 MG: 10 | 30 days supply | Qty: 30 | Fill #0

## 2019-07-08 NOTE — Progress Notes (Deleted)
Virtual Visit via Video   Due to the COVID-19 pandemic, this visit was completed with telemedicine (audio/video) technology to reduce patient and provider exposure as well as to preserve personal protective equipment.   I connected with [name] by a video enabled telemedicine application and verified that I am speaking with the correct person using two identifiers. Location patient: Home Location provider: [facility], Office Persons participating in the virtual visit: KENDALL PIPP, Briscoe Deutscher, DO   I discussed the limitations of evaluation and management by telemedicine and the availability of in person appointments. The patient expressed understanding and agreed to proceed.  Care Team   Patient Care Team: Briscoe Deutscher, DO as PCP - General (Family Medicine) Erline Levine, MD as Consulting Physician (Neurosurgery) Valeta Harms Octavio Graves, DO as Consulting Physician (Pulmonary Disease) Sherren Mocha, MD as Consulting Physician (Cardiology) Danella Sensing, MD as Consulting Physician (Dermatology) Aloha Gell, MD as Consulting Physician (Obstetrics and Gynecology)  Subjective:   HPI:  1. Acute pain of right knee, lateral, proximal fibula. No trauma. Worsened after travel. No back pain. No paresthesias. No treatment so far.     2. Lapband APS resited with [facility] repair June 2013, with weight regain. Working with Clinical research associate. On Metformin.     3. Depression, recurrent ([place]). Lots of stress lately. Full time work, husband had health scare.    4. Psoriasis. Flare. Causing significant discomfort. Trying to get back to old Dermatologist.     5. Vitamin D deficiency. Last check 11/17/2018 28.66    6.  Insulin resistance started on metformin on 11/17/2018.     ROS   Patient Active Problem List   Diagnosis Date Noted  . CAD in native artery 11/18/2018  . Morbid obesity with BMI of 40.0-44.9, adult ([place]) 11/17/2018  . Ventricular Bigeminy 11/17/2018  . Depression,  recurrent ([place]) 11/17/2018  . Hypercalcemia 08/19/2018  . Goiter, nodular 08/19/2018  . Atopic dermatitis 08/19/2018  . Elevated coronary artery calcium score, 90th percentile for age and gender 08/16/2018  . Vitamin D deficiency, on daily replacement 01/05/2018  . Psoriasis, clobetasol, referred to Dermatology 01/05/2018  . Chronic low back pain with sciatica 01/05/2018  . Hisotry of microcytic anemia, high risk after bariatric surgery 12/07/2017  . IUD (intrauterine device) in place, Liletta 12/07/2017  . PCOS, Metformin, IUD 10/25/2014  . Migraine with aura 01/11/2014  . Lapband APS resited with [facility] repair June 2013 05/08/2012  . HLD (hyperlipidemia), on Crestor 04/15/2008  . Morbid obesity ([place]) 04/15/2008  . Allergic rhinitis, Zyrtec and Singulair 04/15/2008  . Mild intermittent asthma, albuterol prn 04/15/2008    Social History   Tobacco Use  . Smoking status: Never Smoker  . Smokeless tobacco: Never Used  Substance Use Topics  . Alcohol use: No    Current Outpatient Medications:  .  albuterol (PROVENTIL HFA) 108 (90 Base) MCG/ACT inhaler, Inhale 2 puffs into the lungs every 6 (six) hours as needed for wheezing or shortness of breath., Disp: 18 g, Rfl: 2 .  albuterol (PROVENTIL) (2.5 MG/3ML) 0.083% nebulizer solution, Take 3 mLs (2.5 mg total) by nebulization every 6 (six) hours as needed for wheezing or shortness of breath., Disp: 75 mL, Rfl: 12 .  beclomethasone (QVAR REDIHALER) 80 MCG/ACT inhaler, Inhale 2 puffs into the lungs 2 (two) times daily., Disp: 1 Inhaler, Rfl: 0 .  cetirizine (ZYRTEC) 10 MG tablet, Take 10 mg by mouth daily as needed. , Disp: , Rfl:  .  diclofenac sodium (VOLTAREN) 1 %  GEL, Apply topically to affected area qid, Disp: 100 g, Rfl: 1 .  levonorgestrel (LILETTA, 52 MG,) 18.6 MCG/DAY IUD IUD, 1 each by Intrauterine route once., Disp: , Rfl:  .  metFORMIN (GLUCOPHAGE) 500 MG tablet, TAKE 1-2 TABLETS BY MOUTH 2 TIMES DAILY WITH A MEAL., Disp: 360 tablet,  Rfl: 0 .  metoprolol tartrate (LOPRESSOR) 25 MG tablet, TAKE 1/2 TABLET BY MOUTH 2 TIMES DAILY or as directed., Disp: 60 tablet, Rfl: 1 .  metroNIDAZOLE (FLAGYL) 500 MG tablet, Take 1 tablet (500 mg total) by mouth 2 (two) times daily., Disp: 14 tablet, Rfl: 0 .  montelukast (SINGULAIR) 10 MG tablet, Take 1 tablet (10 mg total) by mouth at bedtime., Disp: 90 tablet, Rfl: 2 .  Multiple Vitamin (MULITIVITAMIN WITH MINERALS) TABS, Take 1 tablet by mouth daily., Disp: , Rfl:  .  rosuvastatin (CRESTOR) 10 MG tablet, Take 1 tablet (10 mg total) by mouth daily., Disp: 30 tablet, Rfl: 11 .  TACLONEX external suspension, Apply 1 application topically See admin instructions. Apply 1 application to the skin two times a day to psoriasis-affected areas, Disp: , Rfl: 3 .  tiZANidine (ZANAFLEX) 4 MG tablet, Take 1 tablet (4 mg total) by mouth every 6 (six) hours as needed for muscle spasms., Disp: 40 tablet, Rfl: 2 .  Vitamin D, Ergocalciferol, (DRISDOL) 1.25 MG (50000 UT) CAPS capsule, Take 1 capsule (50,000 Units total) by mouth every 14 (fourteen) days., Disp: 6 capsule, Rfl: 0  Allergies  Allergen Reactions  . Cephalosporins Anaphylaxis and Other (See Comments)    Caused hypotension and a fever  . Eggs Or Egg-Derived Products Hives, Swelling and Other (See Comments)    Angioedema  . Penicillins Anaphylaxis    Has patient had a PCN reaction causing immediate rash, facial/tongue/throat swelling, SOB or lightheadedness with hypotension: Yes Has patient had a PCN reaction causing severe rash involving mucus membranes or skin necrosis: No Has patient had a PCN reaction that required hospitalization: Observed X12 hrs Has patient had a PCN reaction occurring within the last 10 years: No If all of the above answers are "NO", then may proceed with Cephalosporin use.   . Sulfa Drugs Cross Reactors Other (See Comments)    Liver failure  . Adhesive [Tape] Hives and Itching    Cannot wear for any length of time    . Effexor [Venlafaxine] Other (See Comments)    Lightheadedness, dizziness, and headaches resulted    Objective:   VITALS: Per patient if applicable, see vitals. GENERAL: Alert, appears well and in no acute distress. HEENT: Atraumatic, conjunctiva clear, no obvious abnormalities on inspection of external nose and ears. NECK: Normal movements of the head and neck. CARDIOPULMONARY: No increased WOB. Speaking in clear sentences. I:E ratio WNL.  MS: Moves all visible extremities without noticeable abnormality. PSYCH: Pleasant and cooperative, well-groomed. Speech normal rate and rhythm. Affect is appropriate. Insight and judgement are appropriate. Attention is focused, linear, and appropriate.  NEURO: CN grossly intact. Oriented as arrived to appointment on time with no prompting. Moves both UE equally.  SKIN: No obvious lesions, wounds, erythema, or cyanosis noted on face or hands.  Depression screen [facility] 2/9 11/17/2018 09/04/2018 12/08/2017  Decreased Interest 0 0 0  Down, Depressed, Hopeless 0 0 2  PHQ - 2 Score 0 0 2  Altered sleeping 2 - 3  Tired, decreased energy 2 - 3  Change in appetite 1 - 1  Feeling bad or failure about yourself  1 - 3  Trouble  concentrating 1 - 3  Moving slowly or fidgety/restless 0 - 3  Suicidal thoughts 0 - 0  PHQ-9 Score 7 - 18  Difficult doing work/chores Somewhat difficult - Somewhat difficult    Assessment and Plan:   There are no diagnoses linked to this encounter.  Marland Kitchen COVID-19 Education: The signs and symptoms of COVID-19 were discussed with the patient and how to seek care for testing if needed. The importance of social distancing was discussed today. . Reviewed expectations re: course of current medical issues. . Discussed self-management of symptoms. . Outlined signs and symptoms indicating need for more acute intervention. . Patient verbalized understanding and all questions were answered. Marland Kitchen Health Maintenance issues including appropriate  healthy diet, exercise, and smoking avoidance were discussed with patient. . See orders for this visit as documented in the electronic medical record.  Briscoe Deutscher, DO  Records requested if needed. Time spent: *** minutes, of which >50% was spent in obtaining information about her symptoms, reviewing her previous labs, evaluations, and treatments, counseling her about her condition (please see the discussed topics above), and developing a plan to further investigate it; she had a number of questions which I addressed.

## 2019-07-09 ENCOUNTER — Ambulatory Visit: Payer: No Typology Code available for payment source | Admitting: Family Medicine

## 2019-07-15 ENCOUNTER — Ambulatory Visit (INDEPENDENT_AMBULATORY_CARE_PROVIDER_SITE_OTHER): Payer: No Typology Code available for payment source | Admitting: Family Medicine

## 2019-07-15 ENCOUNTER — Encounter: Payer: Self-pay | Admitting: Family Medicine

## 2019-07-15 VITALS — Ht 70.0 in | Wt 309.0 lb

## 2019-07-15 DIAGNOSIS — E7800 Pure hypercholesterolemia, unspecified: Secondary | ICD-10-CM

## 2019-07-15 DIAGNOSIS — R519 Headache, unspecified: Secondary | ICD-10-CM

## 2019-07-15 DIAGNOSIS — L409 Psoriasis, unspecified: Secondary | ICD-10-CM

## 2019-07-15 DIAGNOSIS — J301 Allergic rhinitis due to pollen: Secondary | ICD-10-CM | POA: Diagnosis not present

## 2019-07-15 DIAGNOSIS — R232 Flushing: Secondary | ICD-10-CM

## 2019-07-15 DIAGNOSIS — Z6841 Body Mass Index (BMI) 40.0 and over, adult: Secondary | ICD-10-CM

## 2019-07-15 DIAGNOSIS — E282 Polycystic ovarian syndrome: Secondary | ICD-10-CM | POA: Diagnosis not present

## 2019-07-15 DIAGNOSIS — D509 Iron deficiency anemia, unspecified: Secondary | ICD-10-CM

## 2019-07-15 DIAGNOSIS — E559 Vitamin D deficiency, unspecified: Secondary | ICD-10-CM

## 2019-07-15 DIAGNOSIS — R51 Headache: Secondary | ICD-10-CM | POA: Diagnosis not present

## 2019-07-15 DIAGNOSIS — Z79899 Other long term (current) drug therapy: Secondary | ICD-10-CM

## 2019-07-15 DIAGNOSIS — E049 Nontoxic goiter, unspecified: Secondary | ICD-10-CM

## 2019-07-15 MED ORDER — GABAPENTIN 100 MG PO CAPS: 100.0000 mg | ORAL_CAPSULE | Freq: Every day | ORAL | 3 refills | Status: DC

## 2019-07-15 MED ORDER — TIZANIDINE HCL 4 MG PO TABS: 4.0000 mg | ORAL_TABLET | Freq: Four times a day (QID) | ORAL | 2 refills | Status: DC | PRN

## 2019-07-15 MED ORDER — CALCIPOTRIENE-BETAMETH DIPROP 0.005-0.064 % EX SUSP: Freq: Two times a day (BID) | CUTANEOUS | 4 refills | Status: DC | PRN

## 2019-07-15 MED FILL — tiZANidine HCL 4 MG TABS: 4 | 10 days supply | Qty: 40 | Fill #0

## 2019-07-15 MED FILL — GABAPENTIN 100 MG CAPSULE: 100 | 30 days supply | Qty: 30 | Fill #0

## 2019-07-15 NOTE — Progress Notes (Signed)
Virtual Visit via Video   Due to the COVID-19 pandemic, this visit was completed with telemedicine (audio/video) technology to reduce patient and provider exposure as well as to preserve personal protective equipment.   I connected with Olivia [name] by a video enabled telemedicine application and verified that I am speaking with the correct person using two identifiers. Location patient: Home Location provider: [facility], Office Persons participating in the virtual visit: LYNDSI ALLY, Briscoe Deutscher, DO   I discussed the limitations of evaluation and management by telemedicine and the availability of in person appointments. The patient expressed understanding and agreed to proceed.  Care Team   Patient Care Team: Briscoe Deutscher, DO as PCP - General (Family Medicine) Erline Levine, MD as Consulting Physician (Neurosurgery) Valeta Harms Octavio Graves, DO as Consulting Physician (Pulmonary Disease) Sherren Mocha, MD as Consulting Physician (Cardiology) Danella Sensing, MD as Consulting Physician (Dermatology) Aloha Gell, MD as Consulting Physician (Obstetrics and Gynecology)  Subjective:   HPI:    1. Headaches. Worse since head trauma months ago. Happening several times weekly. Improved somewhat with caffeine, but increases PVCs. Hx of ocular migraines, now with tension pain as well. Some word-finding issues when bad. Has tried Topamax, Pamelor, Botox in the past. Uses Zanaflex prn but tried to avoid as too sedating. Abortive medications make her feel terrible. Open to getting a sleep study when COVID situation improved.    2. Lapband APS resited with [facility] repair June 2013, with weight regain. Working with Clinical research associate. On Metformin 500 MG BID.  Patient need a refill today.    3. Depression, recurrent ([place]). Lots of stress lately. Full time work, husband had health scare.    4. Psoriasis.Patient c/o dermatologist office complying with her about requesting generic brand of Taclonex  because it's too expensive for brand name.   5. Due for labs, flu shot (egg allergy).   Patient Active Problem List   Diagnosis Date Noted  . Frequent headaches 07/18/2019  . Hot flashes 07/18/2019  . CAD in native artery 11/18/2018  . Morbid obesity with BMI of 40.0-44.9, adult ([place]) 11/17/2018  . Ventricular bigeminy 11/17/2018  . Depression, recurrent ([place]) 11/17/2018  . Hypercalcemia 08/19/2018  . Goiter, multinodular 08/19/2018  . Atopic dermatitis 08/19/2018  . Elevated coronary artery calcium score, 90th percentile for age and gender 08/16/2018  . Vitamin D deficiency, on daily replacement 01/05/2018  . Psoriasis, clobetasol, referred to Dermatology 01/05/2018  . Chronic low back pain with sciatica 01/05/2018  . Hisotry of microcytic anemia, high risk after bariatric surgery 12/07/2017  . IUD (intrauterine device) in place, Liletta 12/07/2017  . PCOS, Metformin, IUD 10/25/2014  . Migraine with aura 01/11/2014  . Lapband APS resited with [facility] repair June 2013 05/08/2012  . HLD (hyperlipidemia), on Crestor 04/15/2008  . Morbid obesity ([place]) 04/15/2008  . Allergic rhinitis, Zyrtec and Singulair 04/15/2008  . Mild intermittent asthma, albuterol prn 04/15/2008    Social History   Tobacco Use  . Smoking status: Never Smoker  . Smokeless tobacco: Never Used  Substance Use Topics  . Alcohol use: No   Current Outpatient Medications:  .  albuterol (PROVENTIL HFA) 108 (90 Base) MCG/ACT inhaler, Inhale 2 puffs into the lungs every 6 (six) hours as needed for wheezing or shortness of breath., Disp: 18 g, Rfl: 2 .  beclomethasone (QVAR REDIHALER) 80 MCG/ACT inhaler, Inhale 2 puffs into the lungs 2 (two) times daily., Disp: 1 Inhaler, Rfl: 0 .  cetirizine (ZYRTEC) 10 MG tablet, Take 10  mg by mouth daily as needed. , Disp: , Rfl:  .  diclofenac sodium (VOLTAREN) 1 % GEL, Apply topically to affected area qid, Disp: 100 g, Rfl: 1 .  levonorgestrel (LILETTA, 52 MG,) 18.6 MCG/DAY IUD IUD,  1 each by Intrauterine route once., Disp: , Rfl:  .  metFORMIN (GLUCOPHAGE) 500 MG tablet, TAKE 1-2 TABLETS BY MOUTH 2 TIMES DAILY WITH A MEAL., Disp: 360 tablet, Rfl: 0 .  metoprolol tartrate (LOPRESSOR) 25 MG tablet, TAKE 1/2 TABLET BY MOUTH 2 TIMES DAILY or as directed., Disp: 60 tablet, Rfl: 1 .  montelukast (SINGULAIR) 10 MG tablet, Take 1 tablet (10 mg total) by mouth at bedtime., Disp: 90 tablet, Rfl: 2 .  Multiple Vitamin (MULITIVITAMIN WITH MINERALS) TABS, Take 1 tablet by mouth daily., Disp: , Rfl:  .  rosuvastatin (CRESTOR) 10 MG tablet, Take 1 tablet (10 mg total) by mouth daily., Disp: 30 tablet, Rfl: 11 .  tiZANidine (ZANAFLEX) 4 MG tablet, Take 1 tablet (4 mg total) by mouth every 6 (six) hours as needed for muscle spasms., Disp: 40 tablet, Rfl: 2 .  calcipotriene-betamethasone (TACLONEX SCALP) external suspension, Apply topically 2 (two) times daily as needed., Disp: 120 g, Rfl: 4  Allergies  Allergen Reactions  . Cephalosporins Anaphylaxis and Other (See Comments)    Caused hypotension and a fever  . Eggs Or Egg-Derived Products Hives, Swelling and Other (See Comments)    Angioedema  . Penicillins Anaphylaxis    Has patient had a PCN reaction causing immediate rash, facial/tongue/throat swelling, SOB or lightheadedness with hypotension: Yes Has patient had a PCN reaction causing severe rash involving mucus membranes or skin necrosis: No Has patient had a PCN reaction that required hospitalization: Observed X12 hrs Has patient had a PCN reaction occurring within the last 10 years: No If all of the above answers are "NO", then may proceed with Cephalosporin use.   . Sulfa Drugs Cross Reactors Other (See Comments)    Liver failure  . Adhesive [Tape] Hives and Itching    Cannot wear for any length of time  . Effexor [Venlafaxine] Other (See Comments)    Lightheadedness, dizziness, and headaches resulted    Objective:   VITALS: Per patient if applicable, see  vitals. GENERAL: Alert, appears well and in no acute distress. HEENT: Atraumatic, conjunctiva clear, no obvious abnormalities on inspection of external nose and ears. NECK: Normal movements of the head and neck. CARDIOPULMONARY: No increased WOB. Speaking in clear sentences. I:E ratio WNL.  MS: Moves all visible extremities without noticeable abnormality. PSYCH: Pleasant and cooperative, well-groomed. Speech normal rate and rhythm. Affect is appropriate. Insight and judgement are appropriate. Attention is focused, linear, and appropriate.  NEURO: CN grossly intact. Oriented as arrived to appointment on time with no prompting. Moves both UE equally.  SKIN: No obvious lesions, wounds, erythema, or cyanosis noted on face or hands.  Depression screen [facility] 2/9 07/15/2019 11/17/2018 09/04/2018  Decreased Interest 0 0 0  Down, Depressed, Hopeless 0 0 0  PHQ - 2 Score 0 0 0  Altered sleeping 3 2 -  Tired, decreased energy 3 2 -  Change in appetite 0 1 -  Feeling bad or failure about yourself  2 1 -  Trouble concentrating 3 1 -  Moving slowly or fidgety/restless 1 0 -  Suicidal thoughts 0 0 -  PHQ-9 Score 12 7 -  Difficult doing work/chores Somewhat difficult Somewhat difficult -    Assessment and Plan:   Olivia [name] was seen today  for medication refill.  Diagnoses and all orders for this visit:  Frequent headaches Comments: Discussed treatment options at length. Will trial Emgality. Okay Gabapentin 100 mg po q hs as it will help hot flashes as well. Okay Zanaflex prn.  Orders: -     gabapentin (NEURONTIN) 100 MG capsule; Take 1 capsule (100 mg total) by mouth at bedtime. -     tiZANidine (ZANAFLEX) 4 MG tablet; Take 1 tablet (4 mg total) by mouth every 6 (six) hours as needed for muscle spasms. -     Galcanezumab-gnlm (EMGALITY) 120 MG/ML SOAJ; Inject 120 mg into the skin every 30 (thirty) days.  Morbid obesity with BMI of 40.0-44.9, adult (HCC)  PCOS, Metformin, IUD  Seasonal allergic  rhinitis due to pollen  Vitamin D deficiency, on daily replacement -     VITAMIN D 25 Hydroxy (Vit-D Deficiency, Fractures); Future  Medication management -     CBC with Differential/Platelet; Future -     Comprehensive metabolic panel; Future -     Vitamin B12; Future  Pure hypercholesterolemia -     Lipid panel; Future  Goiter, multinodular -     TSH; Future -     T4, free; Future  Hisotry of microcytic anemia, high risk after bariatric surgery  Psoriasis Comments: Okay refill of medication as requested. Orders: -     calcipotriene-betamethasone (TACLONEX SCALP) external suspension; Apply topically 2 (two) times daily as needed.  Hot flashes -     gabapentin (NEURONTIN) 100 MG capsule; Take 1 capsule (100 mg total) by mouth at bedtime.    Marland Kitchen COVID-19 Education: The signs and symptoms of COVID-19 were discussed with the patient and how to seek care for testing if needed. The importance of social distancing was discussed today. . Reviewed expectations re: course of current medical issues. . Discussed self-management of symptoms. . Outlined signs and symptoms indicating need for more acute intervention. . Patient verbalized understanding and all questions were answered. Marland Kitchen Health Maintenance issues including appropriate healthy diet, exercise, and smoking avoidance were discussed with patient. . See orders for this visit as documented in the electronic medical record.  Briscoe Deutscher, DO  Records requested if needed. Time spent: 25 minutes, of which >50% was spent in obtaining information about her symptoms, reviewing her previous labs, evaluations, and treatments, counseling her about her condition (please see the discussed topics above), and developing a plan to further investigate it; she had a number of questions which I addressed.

## 2019-07-18 DIAGNOSIS — R519 Headache, unspecified: Secondary | ICD-10-CM | POA: Insufficient documentation

## 2019-07-18 DIAGNOSIS — R232 Flushing: Secondary | ICD-10-CM | POA: Insufficient documentation

## 2019-07-18 MED ORDER — EMGALITY 120 MG/ML ~~LOC~~ SOAJ: 120.0000 mg | SUBCUTANEOUS | 3 refills | Status: DC

## 2019-07-21 ENCOUNTER — Other Ambulatory Visit: Payer: No Typology Code available for payment source

## 2019-07-21 ENCOUNTER — Ambulatory Visit: Payer: No Typology Code available for payment source

## 2019-07-23 ENCOUNTER — Other Ambulatory Visit: Payer: No Typology Code available for payment source

## 2019-07-24 ENCOUNTER — Telehealth: Payer: Self-pay

## 2019-07-24 NOTE — Telephone Encounter (Signed)
AHMARIE UN (Key: [id number]) Rx #: [id number] Emgality 120MG /ML auto-injectors (migraine)   Form Med[employer] ePA Form Created 5 days ago Sent to Plan 2 minutes ago Plan Response 2 minutes ago Submit Clinical Questions less than a minute ago Determination

## 2019-07-26 ENCOUNTER — Other Ambulatory Visit: Payer: No Typology Code available for payment source

## 2019-07-26 ENCOUNTER — Ambulatory Visit: Payer: No Typology Code available for payment source

## 2019-07-29 NOTE — Telephone Encounter (Signed)
aproved for up to 2 pens per month. My chart sent to patient to let her know.

## 2019-07-30 ENCOUNTER — Other Ambulatory Visit: Payer: Self-pay | Admitting: Family Medicine

## 2019-07-30 ENCOUNTER — Other Ambulatory Visit: Payer: Self-pay | Admitting: Cardiovascular Disease

## 2019-07-30 DIAGNOSIS — E282 Polycystic ovarian syndrome: Secondary | ICD-10-CM

## 2019-07-30 MED FILL — tiZANidine HCL 4 MG TABS: 4 | 10 days supply | Qty: 40 | Fill #0

## 2019-07-30 MED FILL — ROSUVASTATIN CALCIUM 10 MG: 10 | 30 days supply | Qty: 30 | Fill #0

## 2019-07-30 MED FILL — GABAPENTIN 100 MG CAPSULE: 100 | 30 days supply | Qty: 30 | Fill #0

## 2019-07-30 MED FILL — metFORMIN HCL 500 MG TABS: 500 | 90 days supply | Qty: 360 | Fill #0

## 2019-07-30 NOTE — Telephone Encounter (Signed)
Last fill 02/11/19  #360/0 Last OV 07/15/19 Next OV 08/03/19

## 2019-08-02 NOTE — Telephone Encounter (Signed)
Patient returned call. Answered all questions will call if any more issues. She will come by to pick up samples.

## 2019-08-02 NOTE — Telephone Encounter (Signed)
Pulled sample for patient. Left in fridge.  L/m to call office so that we can help with other questions.

## 2019-08-03 ENCOUNTER — Ambulatory Visit (INDEPENDENT_AMBULATORY_CARE_PROVIDER_SITE_OTHER): Payer: No Typology Code available for payment source

## 2019-08-03 ENCOUNTER — Other Ambulatory Visit (INDEPENDENT_AMBULATORY_CARE_PROVIDER_SITE_OTHER): Payer: No Typology Code available for payment source

## 2019-08-03 ENCOUNTER — Other Ambulatory Visit: Payer: Self-pay

## 2019-08-03 DIAGNOSIS — E049 Nontoxic goiter, unspecified: Secondary | ICD-10-CM

## 2019-08-03 DIAGNOSIS — Z79899 Other long term (current) drug therapy: Secondary | ICD-10-CM | POA: Diagnosis not present

## 2019-08-03 DIAGNOSIS — E559 Vitamin D deficiency, unspecified: Secondary | ICD-10-CM

## 2019-08-03 DIAGNOSIS — E7800 Pure hypercholesterolemia, unspecified: Secondary | ICD-10-CM | POA: Diagnosis not present

## 2019-08-03 DIAGNOSIS — Z23 Encounter for immunization: Secondary | ICD-10-CM | POA: Diagnosis not present

## 2019-08-03 NOTE — Progress Notes (Signed)
Per orders of Dr. Juleen China, injection of egg-free influenza vaccine given left deltoid IM by Clearnce Sorrel Lyssa Hackley, CMA  Patient tolerated injection well.

## 2019-08-04 LAB — COMPREHENSIVE METABOLIC PANEL
ALT: 10 U/L (ref 0–35)
AST: 16 U/L (ref 0–37)
Albumin: 4.4 g/dL (ref 3.5–5.2)
Alkaline Phosphatase: 54 U/L (ref 39–117)
BUN: 10 mg/dL (ref 6–23)
CO2: 25 mEq/L (ref 19–32)
Calcium: 11.2 mg/dL — ABNORMAL HIGH (ref 8.4–10.5)
Chloride: 105 mEq/L (ref 96–112)
Creatinine, Ser: 0.74 mg/dL (ref 0.40–1.20)
GFR: 82.61 mL/min (ref >60.00)
Glucose, Bld: 95 mg/dL (ref 70–99)
Potassium: 4.7 mEq/L (ref 3.5–5.1)
Sodium: 139 mEq/L (ref 135–145)
Total Bilirubin: 1.1 mg/dL (ref 0.2–1.2)
Total Protein: 7.2 g/dL (ref 6.0–8.3)

## 2019-08-04 LAB — LIPID PANEL
Cholesterol: 230 mg/dL — ABNORMAL HIGH (ref 0–200)
HDL: 61 mg/dL (ref >39.00)
LDL Cholesterol: 148 mg/dL — ABNORMAL HIGH (ref 0–99)
NonHDL: 169.36
Total CHOL/HDL Ratio: 4
Triglycerides: 107 mg/dL (ref 0.0–149.0)
VLDL: 21.4 mg/dL (ref 0.0–40.0)

## 2019-08-04 LAB — CBC WITH DIFFERENTIAL/PLATELET
Basophils Absolute: 0 10*3/uL (ref 0.0–0.1)
Basophils Relative: 0.2 % (ref 0.0–3.0)
Eosinophils Absolute: 0.2 10*3/uL (ref 0.0–0.7)
Eosinophils Relative: 2.3 % (ref 0.0–5.0)
HCT: 47 % — ABNORMAL HIGH (ref 36.0–46.0)
Hemoglobin: 15.5 g/dL — ABNORMAL HIGH (ref 12.0–15.0)
Lymphocytes Relative: 29.5 % (ref 12.0–46.0)
Lymphs Abs: 2.4 10*3/uL (ref 0.7–4.0)
MCHC: 33 g/dL (ref 30.0–36.0)
MCV: 86 fl (ref 78.0–100.0)
Monocytes Absolute: 0.5 10*3/uL (ref 0.1–1.0)
Monocytes Relative: 6.2 % (ref 3.0–12.0)
Neutro Abs: 4.9 10*3/uL (ref 1.4–7.7)
Neutrophils Relative %: 61.8 % (ref 43.0–77.0)
Platelets: 264 10*3/uL (ref 150.0–400.0)
RBC: 5.47 Mil/uL — ABNORMAL HIGH (ref 3.87–5.11)
RDW: 13.8 % (ref 11.5–15.5)
WBC: 8 10*3/uL (ref 4.0–10.5)

## 2019-08-04 LAB — T4, FREE: Free T4: 0.82 ng/dL (ref 0.60–1.60)

## 2019-08-04 LAB — TSH: TSH: 1.46 u[IU]/mL (ref 0.35–4.50)

## 2019-08-04 LAB — VITAMIN B12: Vitamin B-12: 319 pg/mL (ref 211–911)

## 2019-08-04 LAB — VITAMIN D 25 HYDROXY (VIT D DEFICIENCY, FRACTURES): VITD: 33.13 ng/mL (ref 30.00–100.00)

## 2019-08-05 ENCOUNTER — Encounter: Payer: Self-pay | Admitting: Family Medicine

## 2019-08-06 ENCOUNTER — Other Ambulatory Visit: Payer: Self-pay

## 2019-08-06 DIAGNOSIS — R238 Other skin changes: Secondary | ICD-10-CM

## 2019-08-17 ENCOUNTER — Encounter: Payer: Self-pay | Admitting: Family Medicine

## 2019-08-17 ENCOUNTER — Encounter: Payer: No Typology Code available for payment source | Admitting: Pharmacist

## 2019-08-17 ENCOUNTER — Other Ambulatory Visit: Payer: Self-pay

## 2019-08-17 ENCOUNTER — Ambulatory Visit: Payer: No Typology Code available for payment source | Admitting: Pharmacist

## 2019-08-17 DIAGNOSIS — Z7189 Other specified counseling: Secondary | ICD-10-CM

## 2019-08-17 MED ORDER — OTEZLA 30 MG PO TABS: 30.0000 mg | ORAL_TABLET | Freq: Two times a day (BID) | ORAL | 2 refills | Status: DC

## 2019-08-17 MED FILL — OTEZLA 30 MG TABS: 30 | 30 days supply | Qty: 60 | Fill #0

## 2019-08-17 NOTE — Progress Notes (Signed)
  S: Patient presents for review of their specialty medication therapy.  Patient is currently taking Otezla for psoriasis. Patient is managed by Dr. Danella Sensing [facility]) for this.   Adherence: has not taken   Efficacy: has not taken   Dosing:  Active psoriatic arthritis or plaque psoriasis (moderate to severe): Oral: Initial: 10 mg in the morning. Titrate upward by additional 10 mg per day on days 2 to 5 as follows: Day 2: 10 mg twice daily; Day 3: 10 mg in the morning and 20 mg in the evening; Day 4: 20 mg twice daily; Day 5: 20 mg in the morning and 30 mg in the evening. Maintenance dose: 30 mg twice daily starting on day 6  Current adverse effects: Headache: has not yet started; counseling given  GI upset: has not yet started; counseling given  Weight loss: has not yet started; counseling given  Neuropsychiatric effects: has not yet started; counseling given   O:  Lab Results  Component Value Date   WBC 8.0 08/03/2019   HGB 15.5 (H) 08/03/2019   HCT 47.0 (H) 08/03/2019   MCV 86.0 08/03/2019   PLT 264.0 08/03/2019     Chemistry      Component Value Date/Time   NA 139 08/03/2019 1514   NA 142 07/08/2018 1210   K 4.7 08/03/2019 1514   CL 105 08/03/2019 1514   CO2 25 08/03/2019 1514   BUN 10 08/03/2019 1514   BUN 9 07/08/2018 1210   CREATININE 0.74 08/03/2019 1514      Component Value Date/Time   CALCIUM 11.2 (H) 08/03/2019 1514   ALKPHOS 54 08/03/2019 1514   AST 16 08/03/2019 1514   ALT 10 08/03/2019 1514   BILITOT 1.1 08/03/2019 1514   BILITOT 0.5 11/17/2018 0830     A/P: 1. Medication review: patient is planning on initiating [place] for psoriasis. Reviewed the medication including the following: apremilast inhibits phosphodiesterase 4 (PDE4) specific for cyclic adenosine monophosphate (cAMP) which results in increased intracellular cAMP levels and regulation of numerous inflammatory mediators (eg, decreased expression of nitric oxide synthase,  TNF-alpha, and interleukin [IL]-23, as well as increased IL-10. Patient educated on purpose, proper use and potential adverse effects of Otezla. Possible adverse effects include weight loss, GI upset, headache, and mood changes. Renal function should be routinely monitored. Administer without regard to food. Do not crush, chew, or split tablets. No recommendations for any changes at this time.

## 2019-08-18 MED FILL — EMGALITY 120 MG/ML SOAJ: 120 | 30 days supply | Qty: 1 | Fill #0

## 2019-08-20 ENCOUNTER — Encounter: Payer: Self-pay | Admitting: Family Medicine

## 2019-08-20 NOTE — Telephone Encounter (Signed)
Called patient declined lipid clinic right now would like referral but she will check and see if Dr Buddy Duty is in network and call us back.

## 2019-08-22 NOTE — Telephone Encounter (Signed)
Completed. Olivia Deutscher, DO

## 2019-08-26 MED FILL — MONTELUKAST SOD 10 MG TAB: 10 | 90 days supply | Qty: 90 | Fill #0

## 2019-09-20 ENCOUNTER — Other Ambulatory Visit: Payer: Self-pay | Admitting: Cardiovascular Disease

## 2019-09-20 MED FILL — OTEZLA 30 MG TABS: 30 | 30 days supply | Qty: 60 | Fill #1

## 2019-09-22 MED FILL — ROSUVASTATIN CALCIUM 10 MG: 10 | 15 days supply | Qty: 15 | Fill #0

## 2019-10-20 MED FILL — OTEZLA 30 MG TABS: 30 | 30 days supply | Qty: 60 | Fill #2

## 2019-11-10 ENCOUNTER — Other Ambulatory Visit: Payer: Self-pay | Admitting: Internal Medicine

## 2019-11-10 DIAGNOSIS — M8588 Other specified disorders of bone density and structure, other site: Secondary | ICD-10-CM

## 2019-11-11 MED FILL — TRIAMCINOLONE 0.1% CREAM: 0.1 | 14 days supply | Qty: 454 | Fill #0

## 2019-11-18 ENCOUNTER — Other Ambulatory Visit: Payer: Self-pay | Admitting: Pharmacist

## 2019-11-18 MED ORDER — OTEZLA 30 MG PO TABS: 30.0000 mg | ORAL_TABLET | Freq: Two times a day (BID) | ORAL | 2 refills | Status: DC

## 2019-11-18 MED FILL — OTEZLA 30 MG TABS: 30 | 30 days supply | Qty: 60 | Fill #0

## 2019-11-29 MED FILL — CHLORHEXIDINE 0.12% RINSE: 0.12 | 14 days supply | Qty: 473 | Fill #0

## 2019-11-29 MED FILL — CLINDAMYCIN HCL 150 MG CAPS: 150 | 10 days supply | Qty: 30 | Fill #0

## 2019-12-13 MED FILL — OTEZLA 30 MG TABS: 30 | 30 days supply | Qty: 60 | Fill #1

## 2020-01-25 ENCOUNTER — Ambulatory Visit
Admission: RE | Admit: 2020-01-25 | Discharge: 2020-01-25 | Disposition: A | Discharge status: Home / Self Care | Payer: No Typology Code available for payment source | Source: Ambulatory Visit | Attending: Internal Medicine | Admitting: Internal Medicine

## 2020-01-25 ENCOUNTER — Other Ambulatory Visit: Payer: Self-pay

## 2020-01-25 DIAGNOSIS — M8588 Other specified disorders of bone density and structure, other site: Secondary | ICD-10-CM

## 2020-01-28 ENCOUNTER — Other Ambulatory Visit: Payer: Self-pay | Admitting: Internal Medicine

## 2020-01-28 ENCOUNTER — Encounter: Payer: Self-pay | Admitting: General Practice

## 2020-01-28 DIAGNOSIS — E042 Nontoxic multinodular goiter: Secondary | ICD-10-CM

## 2020-01-28 DIAGNOSIS — E213 Hyperparathyroidism, unspecified: Secondary | ICD-10-CM

## 2020-01-28 LAB — COMPREHENSIVE METABOLIC PANEL
Albumin: 4.4 (ref 3.5–5.0)
Calcium: 11.3 — AB (ref 8.7–10.7)
GFR calc Af Amer: 80
GFR calc non Af Amer: 66

## 2020-01-28 LAB — BASIC METABOLIC PANEL
BUN: 13 (ref 4–21)
Creatinine: 0.9 (ref 0.5–1.1)
Glucose: 91
Potassium: 4.6 (ref 3.4–5.3)
Sodium: 140 (ref 137–147)

## 2020-01-28 LAB — HEPATIC FUNCTION PANEL
ALT: 10 (ref 7–35)
AST: 18 (ref 13–35)
Bilirubin, Total: 0.8

## 2020-02-03 ENCOUNTER — Telehealth: Payer: Self-pay

## 2020-02-03 NOTE — Telephone Encounter (Signed)
Patient is requesting a referral to [facility]. Dr. Buddy Duty with [facility] referred patient to see Dr. Harlow Asa with [facility] to have her parathyroid removed. Patient has [employer] through [employer] and insurance is requesting that PCP send in referral to [facility]. Please advise. Patient has NP appt with you in June

## 2020-02-04 ENCOUNTER — Other Ambulatory Visit: Payer: Self-pay

## 2020-02-04 ENCOUNTER — Ambulatory Visit (HOSPITAL_COMMUNITY)
Admission: RE | Admit: 2020-02-04 | Discharge: 2020-02-04 | Disposition: A | Discharge status: Home / Self Care | Payer: No Typology Code available for payment source | Source: Ambulatory Visit | Attending: Internal Medicine | Admitting: Internal Medicine

## 2020-02-04 DIAGNOSIS — E042 Nontoxic multinodular goiter: Secondary | ICD-10-CM

## 2020-02-07 ENCOUNTER — Other Ambulatory Visit: Payer: Self-pay

## 2020-02-07 NOTE — Telephone Encounter (Signed)
Please refer. I've reviewed notes. thanks

## 2020-02-07 NOTE — Telephone Encounter (Signed)
Referral with Dr. Harlow Asa at [place] has been placed

## 2020-02-08 ENCOUNTER — Encounter (HOSPITAL_COMMUNITY)
Admission: RE | Admit: 2020-02-08 | Discharge: 2020-02-08 | Disposition: A | Discharge status: Home / Self Care | Payer: No Typology Code available for payment source | Source: Ambulatory Visit | Attending: Internal Medicine | Admitting: Internal Medicine

## 2020-02-08 ENCOUNTER — Other Ambulatory Visit: Payer: Self-pay

## 2020-02-08 DIAGNOSIS — E213 Hyperparathyroidism, unspecified: Secondary | ICD-10-CM | POA: Diagnosis present

## 2020-02-09 ENCOUNTER — Other Ambulatory Visit: Payer: Self-pay | Admitting: Internal Medicine

## 2020-02-09 DIAGNOSIS — E042 Nontoxic multinodular goiter: Secondary | ICD-10-CM

## 2020-02-15 ENCOUNTER — Other Ambulatory Visit (HOSPITAL_COMMUNITY)
Admission: RE | Admit: 2020-02-15 | Discharge: 2020-02-15 | Disposition: A | Discharge status: Home / Self Care | Payer: No Typology Code available for payment source | Source: Ambulatory Visit | Attending: Radiology | Admitting: Radiology

## 2020-02-15 ENCOUNTER — Ambulatory Visit
Admission: RE | Admit: 2020-02-15 | Discharge: 2020-02-15 | Disposition: A | Discharge status: Home / Self Care | Payer: No Typology Code available for payment source | Source: Ambulatory Visit | Attending: Internal Medicine | Admitting: Internal Medicine

## 2020-02-15 DIAGNOSIS — E042 Nontoxic multinodular goiter: Secondary | ICD-10-CM | POA: Diagnosis present

## 2020-02-16 LAB — CYTOLOGY - NON PAP

## 2020-02-21 ENCOUNTER — Encounter: Payer: Self-pay | Admitting: Family Medicine

## 2020-02-21 ENCOUNTER — Other Ambulatory Visit: Payer: Self-pay

## 2020-02-21 ENCOUNTER — Other Ambulatory Visit: Payer: Self-pay | Admitting: Family Medicine

## 2020-02-21 MED ORDER — METOPROLOL TARTRATE 25 MG PO TABS: ORAL_TABLET | ORAL | 1 refills | Status: DC

## 2020-02-21 NOTE — Telephone Encounter (Signed)
Please send message; refille metoprolol  We will discuss cholesterol medications and such at her [facility] visit in June. Thanks.

## 2020-02-23 MED FILL — METOPROLOL TARTRATE 25 MG T: 25 | 60 days supply | Qty: 60 | Fill #0

## 2020-03-01 ENCOUNTER — Other Ambulatory Visit: Payer: Self-pay | Admitting: Internal Medicine

## 2020-03-01 DIAGNOSIS — E042 Nontoxic multinodular goiter: Secondary | ICD-10-CM

## 2020-03-07 MED FILL — CLINDAMYCIN HCL 150 MG CAPS: 150 | 10 days supply | Qty: 30 | Fill #0

## 2020-03-08 ENCOUNTER — Other Ambulatory Visit: Payer: Self-pay | Admitting: Internal Medicine

## 2020-03-09 ENCOUNTER — Other Ambulatory Visit (HOSPITAL_COMMUNITY)
Admission: RE | Admit: 2020-03-09 | Discharge: 2020-03-09 | Disposition: A | Discharge status: Home / Self Care | Payer: No Typology Code available for payment source | Source: Ambulatory Visit | Attending: Internal Medicine | Admitting: Internal Medicine

## 2020-03-09 ENCOUNTER — Ambulatory Visit
Admission: RE | Admit: 2020-03-09 | Discharge: 2020-03-09 | Disposition: A | Discharge status: Home / Self Care | Payer: No Typology Code available for payment source | Source: Ambulatory Visit | Attending: Internal Medicine | Admitting: Internal Medicine

## 2020-03-09 DIAGNOSIS — E041 Nontoxic single thyroid nodule: Secondary | ICD-10-CM | POA: Insufficient documentation

## 2020-03-09 DIAGNOSIS — E042 Nontoxic multinodular goiter: Secondary | ICD-10-CM

## 2020-03-10 LAB — CYTOLOGY - NON PAP

## 2020-03-29 ENCOUNTER — Other Ambulatory Visit: Payer: Self-pay | Admitting: Family Medicine

## 2020-03-29 ENCOUNTER — Other Ambulatory Visit: Payer: Self-pay

## 2020-03-29 ENCOUNTER — Encounter: Payer: Self-pay | Admitting: Family Medicine

## 2020-03-29 DIAGNOSIS — J4520 Mild intermittent asthma, uncomplicated: Secondary | ICD-10-CM

## 2020-03-29 MED ORDER — ALBUTEROL SULFATE HFA 108 (90 BASE) MCG/ACT IN AERS: 2.0000 | INHALATION_SPRAY | Freq: Four times a day (QID) | RESPIRATORY_TRACT | 2 refills | Status: DC | PRN

## 2020-03-29 MED ORDER — QVAR REDIHALER 80 MCG/ACT IN AERB: INHALATION_SPRAY | RESPIRATORY_TRACT | 0 refills | Status: DC

## 2020-04-04 MED FILL — ALBUTEROL SULFATE HFA 108 (: 108 (90 BAS | 25 days supply | Qty: 7 | Fill #0

## 2020-04-10 ENCOUNTER — Ambulatory Visit: Payer: Self-pay | Admitting: Surgery

## 2020-05-01 ENCOUNTER — Other Ambulatory Visit (HOSPITAL_COMMUNITY): Payer: Self-pay | Admitting: Family Medicine

## 2020-05-01 ENCOUNTER — Encounter: Payer: Self-pay | Admitting: Family Medicine

## 2020-05-01 ENCOUNTER — Other Ambulatory Visit: Payer: Self-pay

## 2020-05-01 ENCOUNTER — Ambulatory Visit (INDEPENDENT_AMBULATORY_CARE_PROVIDER_SITE_OTHER): Payer: No Typology Code available for payment source | Admitting: Family Medicine

## 2020-05-01 VITALS — BP 130/76 | HR 96 | Temp 98.1°F | Ht 69.5 in | Wt 329.0 lb

## 2020-05-01 DIAGNOSIS — I498 Other specified cardiac arrhythmias: Secondary | ICD-10-CM

## 2020-05-01 DIAGNOSIS — Z23 Encounter for immunization: Secondary | ICD-10-CM | POA: Diagnosis not present

## 2020-05-01 DIAGNOSIS — Z9884 Bariatric surgery status: Secondary | ICD-10-CM | POA: Diagnosis not present

## 2020-05-01 DIAGNOSIS — F5102 Adjustment insomnia: Secondary | ICD-10-CM

## 2020-05-01 DIAGNOSIS — L409 Psoriasis, unspecified: Secondary | ICD-10-CM

## 2020-05-01 DIAGNOSIS — E559 Vitamin D deficiency, unspecified: Secondary | ICD-10-CM

## 2020-05-01 DIAGNOSIS — E049 Nontoxic goiter, unspecified: Secondary | ICD-10-CM

## 2020-05-01 DIAGNOSIS — E782 Mixed hyperlipidemia: Secondary | ICD-10-CM | POA: Diagnosis not present

## 2020-05-01 DIAGNOSIS — Z0000 Encounter for general adult medical examination without abnormal findings: Secondary | ICD-10-CM | POA: Diagnosis not present

## 2020-05-01 DIAGNOSIS — E282 Polycystic ovarian syndrome: Secondary | ICD-10-CM

## 2020-05-01 LAB — CBC WITH DIFFERENTIAL/PLATELET
Basophils Absolute: 0.1 K/uL (ref 0.0–0.1)
Basophils Relative: 0.9 % (ref 0.0–3.0)
Eosinophils Absolute: 0.3 K/uL (ref 0.0–0.7)
Eosinophils Relative: 4.6 % (ref 0.0–5.0)
HCT: 44.8 % (ref 36.0–46.0)
Hemoglobin: 14.8 g/dL (ref 12.0–15.0)
Lymphocytes Relative: 34.8 % (ref 12.0–46.0)
Lymphs Abs: 2.2 K/uL (ref 0.7–4.0)
MCHC: 33.1 g/dL (ref 30.0–36.0)
MCV: 85.8 fl (ref 78.0–100.0)
Monocytes Absolute: 0.5 K/uL (ref 0.1–1.0)
Monocytes Relative: 8.3 % (ref 3.0–12.0)
Neutro Abs: 3.2 K/uL (ref 1.4–7.7)
Neutrophils Relative %: 51.4 % (ref 43.0–77.0)
Platelets: 231 K/uL (ref 150.0–400.0)
RBC: 5.21 Mil/uL — ABNORMAL HIGH (ref 3.87–5.11)
RDW: 13.7 % (ref 11.5–15.5)
WBC: 6.3 K/uL (ref 4.0–10.5)

## 2020-05-01 LAB — COMPREHENSIVE METABOLIC PANEL WITH GFR
ALT: 14 U/L (ref 0–35)
AST: 18 U/L (ref 0–37)
Albumin: 4.4 g/dL (ref 3.5–5.2)
Alkaline Phosphatase: 55 U/L (ref 39–117)
BUN: 12 mg/dL (ref 6–23)
CO2: 23 meq/L (ref 19–32)
Calcium: 10.7 mg/dL — ABNORMAL HIGH (ref 8.4–10.5)
Chloride: 106 meq/L (ref 96–112)
Creatinine, Ser: 0.72 mg/dL (ref 0.40–1.20)
GFR: 85.01 mL/min (ref >60.00)
Glucose, Bld: 102 mg/dL — ABNORMAL HIGH (ref 70–99)
Potassium: 4.1 meq/L (ref 3.5–5.1)
Sodium: 139 meq/L (ref 135–145)
Total Bilirubin: 0.8 mg/dL (ref 0.2–1.2)
Total Protein: 6.9 g/dL (ref 6.0–8.3)

## 2020-05-01 LAB — LIPID PANEL
Cholesterol: 227 mg/dL — ABNORMAL HIGH (ref 0–200)
HDL: 64.6 mg/dL (ref >39.00)
LDL Cholesterol: 143 mg/dL — ABNORMAL HIGH (ref 0–99)
NonHDL: 162.63
Total CHOL/HDL Ratio: 4
Triglycerides: 100 mg/dL (ref 0.0–149.0)
VLDL: 20 mg/dL (ref 0.0–40.0)

## 2020-05-01 LAB — TSH: TSH: 2.38 u[IU]/mL (ref 0.35–4.50)

## 2020-05-01 LAB — B12 AND FOLATE PANEL
Folate: 19.8 ng/mL (ref >5.9)
Vitamin B-12: 416 pg/mL (ref 211–911)

## 2020-05-01 LAB — VITAMIN D 25 HYDROXY (VIT D DEFICIENCY, FRACTURES): VITD: 26.88 ng/mL — ABNORMAL LOW (ref 30.00–100.00)

## 2020-05-01 MED ORDER — FLOVENT HFA 110 MCG/ACT IN AERO
2.0000 | INHALATION_SPRAY | Freq: Two times a day (BID) | RESPIRATORY_TRACT | 11 refills | Status: DC
Start: 2020-05-01 — End: 2020-07-05

## 2020-05-01 MED ORDER — TRAZODONE HCL 50 MG PO TABS: 25.0000 mg | ORAL_TABLET | Freq: Every evening | ORAL | 3 refills | Status: DC | PRN

## 2020-05-01 NOTE — Progress Notes (Signed)
Subjective  Chief Complaint  Patient presents with  . Annual Exam    fasting   . Transitions Of Care  . Follow-up    multiple chronic medical problems    HPI: Olivia [name] is a 54 y.o. female who presents to [facility] at [facility] today for a Female Wellness Visit. She also has the concerns and/or needs as listed above in the chief complaint. These will be addressed in addition to the Health Maintenance Visit. I reviewed chart in detail  Wellness Visit: annual visit with health maintenance review and exam without Pap   HM: sees gyn. Overdue for mammo and defers until can get in with gyn. imms up to date.  Obesity: chronic problem. Struggles. Feels defeated. High anxiety.  Chronic disease f/u and/or acute problem visit: (deemed necessary to be done in addition to the wellness visit):  HLD on statin due for recheck.   Multinodular goiter and parathyroid adenoma for total thyroidectomy and partial parathyroidectomy in July; worries about effects on mood, headaches and weight.   H/o lab band surgery but has regained weight.   Psoriasis, severe; having troubles with insurance approving medications: not on anything right now. Painful. Sees Wilhemina Bonito, MD  PVCs and can't take bb due to psoriasis. Daily sxs. Followed by cardiology  Anxiety: chronic and now with increase stress due to surgery. Not sleeping well.   PCOS on metformin but can give her headaches and GI upset. Has IUD in place due out in fall. Likely perimenopausal.   Assessment  1. Annual physical exam   2. Need for Tdap vaccination   3. Mixed hyperlipidemia   4. Morbid obesity ([name])   5. History of laparoscopic adjustable gastric banding   6. Vitamin D deficiency   7. Goiter, multinodular   8. Psoriasis   9. Ventricular bigeminy   10. Adjustment insomnia   11. PCOS (polycystic ovarian syndrome)      Plan  Female Wellness Visit:  Age appropriate Health Maintenance and Prevention measures  were discussed with patient. Included topics are cancer screening recommendations, ways to keep healthy (see AVS) including dietary and exercise recommendations, regular eye and dental care, use of seat belts, and avoidance of moderate alcohol use and tobacco use. rec mammo and eye exam.  BMI: discussed patient's BMI and encouraged positive lifestyle modifications to help get to or maintain a target BMI.  HM needs and immunizations were addressed and ordered. See below for orders. See HM and immunization section for updates. utd  Routine labs and screening tests ordered including cmp, cbc and lipids where appropriate.  Discussed recommendations regarding Vit D and calcium supplementation (see AVS)  Chronic disease management visit and/or acute problem visit:  Anxiety/stress: counseling done  Obesity: chronic. Give grace. Eats well. Will start exercising again. Needs to get through surgery and get thyroid adjusted prior to this being a priority again.   Thyroid/para adenoma: for surgery. Reassured.   Symptomatic pvcs  Insomnia: start trazadone: will help with anxiety and weight loss as well.   PCOS: f/u with GYN> believes met helps her maintain weight.   Check vit and iron levels.   Psoriasis: to start meds once surgery is done if able.   Follow up: 3 months for recheck   Orders Placed This Encounter  Procedures  . Tdap vaccine greater than or equal to 7yo IM  . CBC with Differential/Platelet  . Comprehensive metabolic panel  . Lipid panel  . TSH  . VITAMIN D  25 Hydroxy (Vit-D Deficiency, Fractures)  . B12 and Folate Panel  . Iron, TIBC and Ferritin Panel   Meds ordered this encounter  Medications  . fluticasone (FLOVENT HFA) 110 MCG/ACT inhaler    Sig: Inhale 2 puffs into the lungs in the morning and at bedtime.    Dispense:  1 Inhaler    Refill:  11  . traZODone (DESYREL) 50 MG tablet    Sig: Take 0.5-1 tablets (25-50 mg total) by mouth at bedtime as needed for sleep.     Dispense:  90 tablet    Refill:  3      Lifestyle: Body mass index is 47.89 kg/m. Wt Readings from Last 3 Encounters:  05/01/20 (!) 329 lb (149.2 kg)  07/15/19 (!) 309 lb (140.2 kg)  12/31/18 (!) 307 lb 3.2 oz (139.3 kg)     Patient Active Problem List   Diagnosis Date Noted  . Major depression, recurrent, chronic ([place]) 11/17/2018    Priority: High  . Goiter, multinodular 08/19/2018    Priority: High    2016 IMPRESSION: Multinodular goiter with multiple bilateral thyroid nodules. These are generally round and hypoechoic. There is a mildly complex cyst in the right lobe measuring up to 1.1 cm. The largest solid nodule is in the inferior right lobe measures 1.8 x 1.1 x 1.4 cm. This could either be sampled or followed by ultrasound.   . History of laparoscopic adjustable gastric banding 05/08/2012    Priority: High    CT 11/19: Small hiatal hernia. Distal circumferential esophageal wall thickening may reflect esophagitis. Lost the 100 pounds from the surgery and then gained it back.    . Mixed hyperlipidemia 04/15/2008    Priority: High  . Morbid obesity ([place]) 04/15/2008    Priority: High  . Ventricular bigeminy 11/17/2018    Priority: Medium    2019: cards eval; intolerant to beta blockers   . Atopic dermatitis 08/19/2018    Priority: Medium  . Psoriasis 01/05/2018    Priority: Medium  . IUD (intrauterine device) in place, [place] 12/07/2017    Priority: Medium  . PCOS (polycystic ovarian syndrome) 10/25/2014    Priority: Medium  . Migraine with aura 01/11/2014    Priority: Medium  . Vitamin D deficiency 01/05/2018    Priority: Low  . Allergic rhinitis 04/15/2008    Priority: Low  . Allergic asthma 04/15/2008    Priority: Low  . Elevated coronary artery calcium score, 90th percentile for age and gender 08/16/2018    IMPRESSION: 1. Coronary artery calcium score 9.5 Agatston units. This places the patient in the 90th percentile for age and gender. This suggests  high risk for future cardiac events. 2. This study is limited by artifact. However, there only appears tbe plaque in the proximal LAD. There is no more than mild stenosis.   . Chronic low back pain with sciatica 01/05/2018   Health Maintenance  Topic Date Due  . MAMMOGRAM  09/04/2019  . INFLUENZA VACCINE  06/04/2020  . PAP SMEAR-Modifier  09/03/2021  . COLONOSCOPY  09/30/2027  . TETANUS/TDAP  05/01/2030  . COVID-19 Vaccine  Completed  . Hepatitis C Screening  Completed  . HIV Screening  Completed   Immunization History  Administered Date(s) Administered  . Influenza, Quadrivalent, Recombinant, Inj, Pf 08/03/2019  . Influenza,inj,Quad PF,6+ Mos 08/07/2018  . Influenza-Unspecified 10/25/2014  . PFIZER SARS-COV-2 Vaccination 10/27/2019, 11/17/2019  . Tdap 05/01/2020   We updated and reviewed the patient's past history in detail and it is documented  below. Allergies: Patient is allergic to cephalosporins, eggs or egg-derived products, penicillins, sulfa drugs cross reactors, adhesive [tape], and effexor [venlafaxine]. Past Medical History Patient  has a past medical history of Asthma, Environmental allergies, Hiatal hernia, Hiatal hernia without gangrene or obstruction (08/19/2018), HLD (hyperlipidemia) (04/15/2008), Microcytic anemia, Migraine, hormonal, PACs (premature atrial contraction), PCOS (polycystic ovarian syndrome) (10/25/2014), PONV (postoperative nausea and vomiting), Reflux, and Seasonal allergies. Past Surgical History Patient  has a past surgical history that includes Tonsillectomy (1981); Laparoscopic gastric banding (08/08/2008); and Hiatal hernia repair (04/14/2012). Family History: Patient family history includes Colon cancer in her mother; Colon polyps in her mother; Diabetes in her mother; Hypertension in her father; Obesity in her father, mother, and sister. Social History:  Patient  reports that she has never smoked. She has never used smokeless tobacco. She  reports that she does not drink alcohol and does not use drugs.  Review of Systems: Constitutional: negative for fever or malaise Ophthalmic: negative for photophobia, double vision or loss of vision Cardiovascular: negative for chest pain, dyspnea on exertion, or new LE swelling Respiratory: negative for SOB or persistent cough Gastrointestinal: negative for abdominal pain, change in bowel habits or melena Genitourinary: negative for dysuria or gross hematuria, no abnormal uterine bleeding or disharge Musculoskeletal: negative for new gait disturbance or muscular weakness Integumentary: negative for new or persistent rashes, no breast lumps Neurological: negative for TIA or stroke symptoms Psychiatric: negative for SI or delusions Allergic/Immunologic: negative for hives  Patient Care Team    Relationship Specialty Notifications Start End  Leamon Arnt, MD PCP - General Family Medicine  05/01/20   Erline Levine, MD Consulting Physician Neurosurgery  05/06/18   Garner Nash, DO Consulting Physician Pulmonary Disease  11/17/18   Sherren Mocha, MD Consulting Physician Cardiology  11/17/18   Danella Sensing, MD Consulting Physician Dermatology  11/17/18   Aloha Gell, MD Consulting Physician Obstetrics and Gynecology  11/17/18   Delrae Rend, MD Consulting Physician Endocrinology  12/19/19     Objective  Vitals: BP 130/76   Pulse 96   Temp 98.1 F (36.7 C) (Temporal)   Ht 5' 9.5" (1.765 m)   Wt (!) 329 lb (149.2 kg)   SpO2 96%   BMI 47.89 kg/m  General:  Well developed, well nourished, no acute distress  Psych:  Alert and orientedx3, anxious and teaful HEENT:  Normocephalic, atraumatic, non-icteric sclera,  supple neck without adenopathy, mass or thyromegaly Cardiovascular:  Normal S1, S2, RRR without gallop, rub or murmur Respiratory:  Good breath sounds bilaterally, CTAB with normal respiratory effort Gastrointestinal: normal bowel sounds, soft, non-tender, no noted  masses. No HSM MSK: no deformities, contusions. Joints are without erythema or swelling.  Skin:  Warm large psoriatic plaques on torso and ext Neurologic:    Mental status is normal. CN 2-11 are normal. Gross motor and sensory exams are normal. Normal gait. No tremor    Commons side effects, risks, benefits, and alternatives for medications and treatment plan prescribed today were discussed, and the patient expressed understanding of the given instructions. Patient is instructed to call or message via MyChart if he/she has any questions or concerns regarding our treatment plan. No barriers to understanding were identified. We discussed Red Flag symptoms and signs in detail. Patient expressed understanding regarding what to do in case of urgent or emergency type symptoms.   Medication list was reconciled, printed and provided to the patient in AVS. Patient instructions and summary information was reviewed with the patient as  documented in the AVS. This note was prepared with assistance of Dragon voice recognition software. Occasional wrong-word or sound-a-like substitutions may have occurred due to the inherent limitations of voice recognition software  This visit occurred during the SARS-CoV-2 public health emergency.  Safety protocols were in place, including screening questions prior to the visit, additional usage of staff PPE, and extensive cleaning of exam room while observing appropriate contact time as indicated for disinfecting solutions.

## 2020-05-01 NOTE — Patient Instructions (Signed)
Please return in 8-12 weeks for recheck.   I will release your lab results to you on your MyChart account with further instructions. Please reply with any questions.   Get your eyes examined when you can.  Try the trazadone for sleep.  Good luck with your surgery. You should do well.   It was a pleasure meeting you today! Thank you for choosing [place] to meet your healthcare needs! I truly look forward to working with you. If you have any questions or concerns, please send me a message via Mychart or call the office at [phone]. Today you were given your Tdap vaccination.   Please do these things to maintain good health!   Exercise at least 30-45 minutes a day,  4-5 days a week.   Eat a low-fat diet with lots of fruits and vegetables, up to 7-9 servings per day.  Drink plenty of water daily. Try to drink 8 8oz glasses per day.  Seatbelts can save your life. Always wear your seatbelt.  Place Smoke Detectors on every level of your home and check batteries every year.  Schedule an appointment with an eye doctor for an eye exam every 1-2 years  Avoid heavy alcohol use. If you drink, keep it to less than 2 drinks/day and not every day.  [place].  Choose someone you trust that could speak for you if you became unable to speak for yourself.  Depression is common in our stressful world.If you're feeling down or losing interest in things you normally enjoy, please come in for a visit.  If anyone is threatening or hurting you, please get help. Physical or Emotional Violence is never OK.

## 2020-05-02 ENCOUNTER — Encounter: Payer: Self-pay | Admitting: Family Medicine

## 2020-05-02 LAB — IRON,TIBC AND FERRITIN PANEL
%SAT: 28 % (calc) (ref 16–45)
Ferritin: 41 ng/mL (ref 16–232)
Iron: 96 ug/dL (ref 45–160)
TIBC: 342 mcg/dL (calc) (ref 250–450)

## 2020-05-04 ENCOUNTER — Other Ambulatory Visit (INDEPENDENT_AMBULATORY_CARE_PROVIDER_SITE_OTHER): Payer: No Typology Code available for payment source

## 2020-05-04 DIAGNOSIS — R7309 Other abnormal glucose: Secondary | ICD-10-CM

## 2020-05-04 LAB — HEMOGLOBIN A1C: Hgb A1c MFr Bld: 5.4 % (ref 4.6–6.5)

## 2020-05-12 MED FILL — METOPROLOL TARTRATE 25 MG T: 25 | 60 days supply | Qty: 60 | Fill #1

## 2020-05-12 MED FILL — traZODone HCL 50 MG TABS: 50 | 90 days supply | Qty: 90 | Fill #0

## 2020-05-16 ENCOUNTER — Other Ambulatory Visit: Payer: Self-pay

## 2020-05-16 DIAGNOSIS — Z4651 Encounter for fitting and adjustment of gastric lap band: Secondary | ICD-10-CM

## 2020-05-16 DIAGNOSIS — E210 Primary hyperparathyroidism: Secondary | ICD-10-CM

## 2020-05-16 DIAGNOSIS — E042 Nontoxic multinodular goiter: Secondary | ICD-10-CM

## 2020-05-22 ENCOUNTER — Encounter: Payer: Self-pay | Admitting: Surgery

## 2020-05-22 DIAGNOSIS — Z8639 Personal history of other endocrine, nutritional and metabolic disease: Secondary | ICD-10-CM | POA: Diagnosis present

## 2020-05-22 DIAGNOSIS — E210 Primary hyperparathyroidism: Secondary | ICD-10-CM | POA: Diagnosis present

## 2020-05-22 NOTE — H&P (Signed)
General Surgery [facility]  Olivia [name] DOB: 1966/02/09 Married / Language: [name] / Race: White Female   History of Present Illness   The patient is a 54 year old female who presents with primary hyperparathyroidism.  CHIEF COMPLAINT: primary hyperparathyroidism, multiple thyroid nodules  Patient is referred by Dr. Dagmar Hait for surgical evaluation and management of newly diagnosed primary hyperparathyroidism. Also, the patient has multiple thyroid nodules. She has a right thyroid nodule measuring 2.6 cm which is undergone 2 attempts at fine-needle aspiration biopsy with insufficient material for diagnosis obtained on both occasions. Patient was diagnosed with hypercalcemia approximately 2 and half years ago. Additional testing has shown an elevated calcium level recently of 11.3, and on suppressed PTH level of 54, and an elevated 24-hour urinary calcium of 418. Patient underwent nuclear medicine parathyroid scan on February 08, 2020. This localized a left inferior parathyroid adenoma. Patient also underwent an ultrasound evaluation of the neck. This was performed on February 04, 2020. There was a 0.95 cm nodule in the left inferior position which may correspond with the signal seen on sestamibi scan and represent the parathyroid adenoma. Patient also has other thyroid nodules. In the right inferior lobe there is a 2.6 cm nodule which was isoechoic and biopsy was recommended. 2 attempts were insufficient for diagnosis. Patient presents today to discuss parathyroidectomy as well as possible thyroid surgery. Patient has noted fatigue. She has noted mental fogginess. She has bone and joint discomfort. She has had bone density scan is showing bone loss. She denies nephrolithiasis. Patient has had no prior head or neck surgery. She has never been on thyroid medication. Her most recent TSH level was normal at 1.68. She does have a family history of thyroid cancer in  her sister who underwent surgery approximately 4 years ago. 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.   Problem List/Past Medical  ENCOUNTER FOR FITTING AND ADJUSTMENT OF GASTRIC LAP BAND (Z46.51)  PRIMARY HYPERPARATHYROIDISM (E21.0)  MULTIPLE THYROID NODULES (E04.2)   Past Surgical History  Colon Polyp Removal - Colonoscopy  Lap Band  Oral Surgery  Tonsillectomy   Diagnostic Studies History Colonoscopy  1-5 years ago Mammogram  1-3 years ago Pap Smear  1-5 years ago  Allergies  Penicillins  Sulfur  Eggs  Cephalexin *CEPHALOSPORINS*  Allergies Reconciled   Medication History  Albuterol Sulfate HFA (108 (90 Base)MCG/ACT Aerosol Soln, Inhalation) Active. Metoprolol Tartrate (25MG  Tablet, Oral) Active. tiZANidine HCl (4MG  Tablet, Oral) Active. Vitamin B Complex (Oral) Active. Vitamin D (50 MCG(2000 UT) Tablet, Oral) Active. Iron (Oral) Specific strength unknown - Active. Medications Reconciled Clobetasol Propionate (0.05% Lotion, External) Active. MetFORMIN HCl ER (500MG  Tablet ER 24HR, Oral) Active. MetroNIDAZOLE (0.75% Lotion, External) Active. Montelukast Sodium (10MG  Tablet, Oral) Active. Tums (500MG  Tablet Chewable, Oral) Active.  Social History  Alcohol use  Occasional alcohol use. No drug use  Tobacco use  Never smoker. Caffeine use  Tea.  Family History Arthritis  Father. Colon Cancer  Mother. Colon Polyps  Mother. Diabetes Mellitus  Mother. Heart Disease  Father, Mother. Heart disease in female family member before age 40  Hypertension  Father. Melanoma  Father. Migraine Headache  Father. Prostate Cancer  Father. Thyroid problems  Mother, Sister.  Pregnancy / Birth History Age at menarche  60 years. Contraceptive History  Intrauterine device. Gravida  3 Irregular periods  Length (months) of breastfeeding  12-24 Maternal age  8-20 Para  2 Regular periods   Other  Problems Asthma  Back Pain  Gastroesophageal Reflux Disease  Heart murmur  Hypercholesterolemia  Migraine Headache  Thyroid Disease  Other disease, cancer, significant illness   Review of Systems  General Present- Fatigue and Weight Gain. Not Present- Appetite Loss, Chills, Fever, Night Sweats and Weight Loss. Skin Present- Dryness and Rash. Not Present- Change in Wart/Mole, Hives, Jaundice, New Lesions, Non-Healing Wounds and Ulcer. HEENT Present- Hoarseness and Wears glasses/contact lenses. Not Present- Earache, Hearing Loss, Nose Bleed, Oral Ulcers, Ringing in the Ears, Seasonal Allergies, Sinus Pain, Sore Throat, Visual Disturbances and Yellow Eyes. Respiratory Not Present- Bloody sputum, Chronic Cough, Difficulty Breathing, Snoring and Wheezing. Breast Not Present- Breast Mass, Breast Pain, Nipple Discharge and Skin Changes. Cardiovascular Present- Palpitations. Not Present- Chest Pain, Difficulty Breathing Lying Down, Leg Cramps, Rapid Heart Rate, Shortness of Breath and Swelling of Extremities. Gastrointestinal Not Present- Abdominal Pain, Bloating, Bloody Stool, Change in Bowel Habits, Chronic diarrhea, Constipation, Difficulty Swallowing, Excessive gas, Gets full quickly at meals, Hemorrhoids, Indigestion, Nausea, Rectal Pain and Vomiting. Female Genitourinary Not Present- Frequency, Nocturia, Painful Urination, Pelvic Pain and Urgency. Musculoskeletal Not Present- Back Pain, Joint Pain, Joint Stiffness, Muscle Pain, Muscle Weakness and Swelling of Extremities. Neurological Not Present- Decreased Memory, Fainting, Headaches, Numbness, Seizures, Tingling, Tremor, Trouble walking and Weakness. Psychiatric Not Present- Anxiety, Bipolar, Change in Sleep Pattern, Depression, Fearful and Frequent crying. Endocrine Present- Cold Intolerance and Hot flashes. Not Present- Excessive Hunger, Hair Changes, Heat Intolerance and New Diabetes. Hematology Not Present- Blood Thinners, Easy  Bruising, Excessive bleeding, Gland problems, HIV and Persistent Infections.  Vitals  Weight: 323.5 lb Height: 68in Body Surface Area: 2.51 m Body Mass Index: 49.19 kg/m  Temp.: 98.30F  Pulse: 105 (Regular)   Physical Exam   GENERAL APPEARANCE Development: normal Nutritional status: normal Gross deformities: none  SKIN Rash, lesions, ulcers: none Induration, erythema: none Nodules: none palpable  EYES Conjunctiva and lids: normal Pupils: equal and reactive Iris: normal bilaterally  EARS, NOSE, MOUTH, THROAT External ears: no lesion or deformity External nose: no lesion or deformity Hearing: grossly normal Due to Covid-19 pandemic, patient is wearing a mask.  NECK Symmetric: yes Trachea: midline Thyroid: There is a palpable 2 cm nodule in the inferior right thyroid lobe which is relatively soft, mobile, and slightly tender to palpation. There are no significant masses present in the left thyroid lobe. There is no associated lymphadenopathy.  CHEST Respiratory effort: normal Retraction or accessory muscle use: no Breath sounds: normal bilaterally Rales, rhonchi, wheeze: none  CARDIOVASCULAR Auscultation: regular rhythm, normal rate Murmurs: none Pulses: radial pulse 2+ palpable Lower extremity edema: none  MUSCULOSKELETAL Station and gait: normal Digits and nails: no clubbing or cyanosis Muscle strength: grossly normal all extremities Range of motion: grossly normal all extremities Deformity: none  LYMPHATIC Cervical: none palpable Supraclavicular: none palpable  PSYCHIATRIC Oriented to person, place, and time: yes Mood and affect: normal for situation Judgment and insight: appropriate for situation    Assessment & Plan   PRIMARY HYPERPARATHYROIDISM (E21.0) MULTIPLE THYROID NODULES (E04.2)  Patient presents today on referral from her endocrinologist to discuss surgery for management of primary hyperparathyroidism and possible surgery  for multiple thyroid nodules.  Patient provided with a copy of "Parathyroid Surgery: Treatment for Your Parathyroid Gland Problem", published by Krames, 12 pages. Book reviewed and explained to patient during visit today.  First we discussed management of primary hyperparathyroidism. Based on her diagnostic studies, the patient does have primary hyperparathyroidism, most likely localizing to a left inferior parathyroid adenoma.  We discussed minimally invasive parathyroid surgery. We discussed the rationale for minimally invasive surgery versus traditional 4 gland exploration. We discussed the hospital stay and the postoperative recovery.  We also discussed thyroid surgery. Patient has a dominant nodule in the inferior right thyroid lobe which has not been successfully biopsied for tissue diagnosis. We discussed options for management.  I believe the patient has 3 options for management. The first option would be to proceed with parathyroidectomy and continue close monitoring of the thyroid nodules with sequential ultrasound scanning and laboratory studies and physical examination. The second option would be to perform parathyroidectomy and right thyroid lobectomy in order to remove the nodule in question and allow for definitive pathologic diagnosis. The third option would be to proceed with parathyroidectomy and total thyroidectomy. We discussed the pros and cons of each approach.  At this time the patient favors proceeding with parathyroidectomy and total thyroidectomy. However, she would like to think about this for a few days and discuss it with her family. She will notify me of a final decision next week.  We will proceed with arranging for scheduling of her surgical procedure in the near future. We have discussed the hospital stay to be anticipated. We have discussed the possible need for lifelong thyroid hormone replacement. We have discussed the possible need for radioactive  iodine treatment in the event of malignancy. The patient understands and agrees with the plan and agrees to proceed.  The risks and benefits of the procedure have been discussed at length with the patient. The patient understands the proposed procedure, potential alternative treatments, and the course of recovery to be expected. All of the patient's questions have been answered at this time. The patient wishes to proceed with surgery.  Armandina Gemma, MD [facility] Office: [phone]

## 2020-05-23 NOTE — Patient Instructions (Addendum)
DUE TO COVID-19 ONLY ONE VISITOR IS ALLOWED TO COME WITH YOU AND STAY IN THE WAITING ROOM ONLY DURING PRE OP AND PROCEDURE DAY OF SURGERY. THE 1 VISITOR MAY VISIT WITH YOU AFTER SURGERY IN YOUR PRIVATE ROOM DURING VISITING HOURS ONLY!  YOU NEED TO HAVE A COVID 19 TEST ON: 05/30/20 @ 9:00 am, THIS TEST MUST BE DONE BEFORE SURGERY, COME  [facility], [place] , [id number].  ([place]) ONCE YOUR COVID TEST IS COMPLETED, PLEASE BEGIN THE QUARANTINE INSTRUCTIONS AS OUTLINED IN YOUR HANDOUT.                Olivia [name]   Your procedure is scheduled on: 06/02/20   Report to [facility] Main  Entrance   Report to admitting at: 7: 30 AM     Call this number if you have problems the morning of surgery [phone]    Remember: Do not eat food or drink liquids :After Midnight.   BRUSH YOUR TEETH MORNING OF SURGERY AND RINSE YOUR MOUTH OUT, NO CHEWING GUM CANDY OR MINTS.     Take these medicines the morning of surgery with A SIP OF WATER: cetirizine,famotidine,metoprolol.                                You may not have any metal on your body including hair pins and              piercings  Do not wear jewelry, make-up, lotions, powders or perfumes, deodorant             Do not wear nail polish on your fingernails.  Do not shave  48 hours prior to surgery.           Do not bring valuables to the hospital. [facility].  Contacts, dentures or bridgework may not be worn into surgery.  Leave suitcase in the car. After surgery it may be brought to your room.     Patients discharged the day of surgery will not be allowed to drive home. IF YOU ARE HAVING SURGERY AND GOING HOME THE SAME DAY, YOU MUST HAVE AN ADULT TO DRIVE YOU HOME AND BE WITH YOU FOR 24 HOURS. YOU MAY GO HOME BY TAXI OR UBER OR ORTHERWISE, BUT AN ADULT MUST ACCOMPANY YOU HOME AND STAY WITH YOU FOR 24 HOURS.  Name and phone number of your  driver:  Special Instructions: N/A              Please read over the following fact sheets you were given: _____________________________________________________________________          [facility] - Preparing for Surgery Before surgery, you can play an important role.  Because skin is not sterile, your skin needs to be as free of germs as possible.  You can reduce the number of germs on your skin by washing with CHG (chlorahexidine gluconate) soap before surgery.  CHG is an antiseptic cleaner which kills germs and bonds with the skin to continue killing germs even after washing. Please DO NOT use if you have an allergy to CHG or antibacterial soaps.  If your skin becomes reddened/irritated stop using the CHG and inform your nurse when you arrive at Short Stay. Do not shave (including legs and underarms) for at least 48 hours  prior to the first CHG shower.  You may shave your face/neck. Please follow these instructions carefully:  1.  Shower with CHG Soap the night before surgery and the  morning of Surgery.  2.  If you choose to wash your hair, wash your hair first as usual with your  normal  shampoo.  3.  After you shampoo, rinse your hair and body thoroughly to remove the  shampoo.                           4.  Use CHG as you would any other liquid soap.  You can apply chg directly  to the skin and wash                       Gently with a scrungie or clean washcloth.  5.  Apply the CHG Soap to your body ONLY FROM THE NECK DOWN.   Do not use on face/ open                           Wound or open sores. Avoid contact with eyes, ears mouth and genitals (private parts).                       Wash face,  Genitals (private parts) with your normal soap.             6.  Wash thoroughly, paying special attention to the area where your surgery  will be performed.  7.  Thoroughly rinse your body with warm water from the neck down.  8.  DO NOT shower/wash with your normal soap after using and rinsing off   the CHG Soap.                9.  Pat yourself dry with a clean towel.            10.  Wear clean pajamas.            11.  Place clean sheets on your bed the night of your first shower and do not  sleep with pets. Day of Surgery : Do not apply any lotions/deodorants the morning of surgery.  Please wear clean clothes to the hospital/surgery center.  FAILURE TO FOLLOW THESE INSTRUCTIONS MAY RESULT IN THE CANCELLATION OF YOUR SURGERY PATIENT SIGNATURE_________________________________  NURSE SIGNATURE__________________________________  ________________________________________________________________________

## 2020-05-24 ENCOUNTER — Ambulatory Visit (HOSPITAL_COMMUNITY)
Admission: RE | Admit: 2020-05-24 | Discharge: 2020-05-24 | Disposition: A | Discharge status: Home / Self Care | Payer: No Typology Code available for payment source | Source: Ambulatory Visit | Attending: Anesthesiology | Admitting: Anesthesiology

## 2020-05-24 ENCOUNTER — Encounter (HOSPITAL_COMMUNITY): Payer: Self-pay

## 2020-05-24 ENCOUNTER — Encounter (HOSPITAL_COMMUNITY)
Admission: RE | Admit: 2020-05-24 | Discharge: 2020-05-24 | Disposition: A | Discharge status: Home / Self Care | Payer: No Typology Code available for payment source | Source: Ambulatory Visit | Attending: Surgery | Admitting: Surgery

## 2020-05-24 ENCOUNTER — Other Ambulatory Visit: Payer: Self-pay

## 2020-05-24 DIAGNOSIS — Z9009 Acquired absence of other part of head and neck: Secondary | ICD-10-CM | POA: Diagnosis present

## 2020-05-24 DIAGNOSIS — E890 Postprocedural hypothyroidism: Secondary | ICD-10-CM

## 2020-05-24 HISTORY — DX: Palpitations: R00.2

## 2020-05-24 HISTORY — DX: Prediabetes: R73.03

## 2020-05-24 HISTORY — DX: Anxiety disorder, unspecified: F41.9

## 2020-05-24 LAB — BASIC METABOLIC PANEL
Anion gap: 9 (ref 5–15)
BUN: 12 mg/dL (ref 6–20)
CO2: 26 mmol/L (ref 22–32)
Calcium: 10.4 mg/dL — ABNORMAL HIGH (ref 8.9–10.3)
Chloride: 106 mmol/L (ref 98–111)
Creatinine, Ser: 0.74 mg/dL (ref 0.44–1.00)
GFR calc Af Amer: >60 mL/min (ref >60)
GFR calc non Af Amer: >60 mL/min (ref >60)
Glucose, Bld: 100 mg/dL — ABNORMAL HIGH (ref 70–99)
Potassium: 4.2 mmol/L (ref 3.5–5.1)
Sodium: 141 mmol/L (ref 135–145)

## 2020-05-24 LAB — CBC
HCT: 45.5 % (ref 36.0–46.0)
Hemoglobin: 14.9 g/dL (ref 12.0–15.0)
MCH: 28.5 pg (ref 26.0–34.0)
MCHC: 32.7 g/dL (ref 30.0–36.0)
MCV: 87.2 fL (ref 80.0–100.0)
Platelets: 242 10*3/uL (ref 150–400)
RBC: 5.22 MIL/uL — ABNORMAL HIGH (ref 3.87–5.11)
RDW: 13.5 % (ref 11.5–15.5)
WBC: 7 10*3/uL (ref 4.0–10.5)
nRBC: 0 % (ref 0.0–0.2)

## 2020-05-24 NOTE — Progress Notes (Signed)
Extensive localized patches areas of psoriasis were noticed today during PST appointment. The areas are bright red with some raised areas.As per patient it gets very itchy and irritating at times.

## 2020-05-24 NOTE — Progress Notes (Signed)
COVID Vaccine Completed: yes Date COVID Vaccine completed:11/17/19 COVID vaccine manufacturer: [place]   PCP - Dr. Dierdre Forth. LOV: 05/01/20 Cardiologist - Dr. Sherren Mocha. LOV: 07/08/18  Chest x-ray -  EKG -  Stress Test -  ECHO -  Cardiac Cath -  A1C: 5.4: 05/04/20 Sleep Study -  CPAP -   Fasting Blood Sugar -  Checks Blood Sugar _____ times a day  Blood Thinner Instructions: Aspirin Instructions: Last Dose:  Anesthesia review: Hx: HTN,PAC's.,palpitations  Patient denies shortness of breath, fever, cough and chest pain at PAT appointment   Patient verbalized understanding of instructions that were given to them at the PAT appointment. Patient was also instructed that they will need to review over the PAT instructions again at home before surgery.

## 2020-05-30 ENCOUNTER — Other Ambulatory Visit (HOSPITAL_COMMUNITY)
Admission: RE | Admit: 2020-05-30 | Discharge: 2020-05-30 | Disposition: A | Discharge status: Home / Self Care | Payer: No Typology Code available for payment source | Source: Ambulatory Visit | Attending: Surgery | Admitting: Surgery

## 2020-05-30 DIAGNOSIS — Z20822 Contact with and (suspected) exposure to covid-19: Secondary | ICD-10-CM | POA: Diagnosis not present

## 2020-05-30 DIAGNOSIS — Z01812 Encounter for preprocedural laboratory examination: Secondary | ICD-10-CM | POA: Diagnosis present

## 2020-05-30 LAB — SARS CORONAVIRUS 2 (TAT 6-24 HRS): SARS Coronavirus 2: NEGATIVE

## 2020-06-02 ENCOUNTER — Ambulatory Visit (HOSPITAL_COMMUNITY): Payer: No Typology Code available for payment source | Admitting: Certified Registered"

## 2020-06-02 ENCOUNTER — Other Ambulatory Visit: Payer: Self-pay

## 2020-06-02 ENCOUNTER — Ambulatory Visit (HOSPITAL_COMMUNITY)
Admission: RE | Admit: 2020-06-02 | Discharge: 2020-06-03 | Disposition: A | Discharge status: Home / Self Care | Payer: No Typology Code available for payment source | Source: Ambulatory Visit | Attending: Surgery | Admitting: Surgery

## 2020-06-02 ENCOUNTER — Encounter (HOSPITAL_COMMUNITY)
Admission: RE | Disposition: A | Discharge status: Home / Self Care | Payer: Self-pay | Source: Ambulatory Visit | Attending: Surgery

## 2020-06-02 ENCOUNTER — Ambulatory Visit (HOSPITAL_COMMUNITY): Payer: No Typology Code available for payment source | Admitting: Physician Assistant

## 2020-06-02 ENCOUNTER — Encounter (HOSPITAL_COMMUNITY): Payer: Self-pay | Admitting: Surgery

## 2020-06-02 DIAGNOSIS — Z87892 Personal history of anaphylaxis: Secondary | ICD-10-CM | POA: Insufficient documentation

## 2020-06-02 DIAGNOSIS — Z888 Allergy status to other drugs, medicaments and biological substances status: Secondary | ICD-10-CM | POA: Insufficient documentation

## 2020-06-02 DIAGNOSIS — G8929 Other chronic pain: Secondary | ICD-10-CM | POA: Diagnosis not present

## 2020-06-02 DIAGNOSIS — Z91012 Allergy to eggs: Secondary | ICD-10-CM | POA: Diagnosis not present

## 2020-06-02 DIAGNOSIS — M8580 Other specified disorders of bone density and structure, unspecified site: Secondary | ICD-10-CM | POA: Insufficient documentation

## 2020-06-02 DIAGNOSIS — Z8349 Family history of other endocrine, nutritional and metabolic diseases: Secondary | ICD-10-CM | POA: Diagnosis not present

## 2020-06-02 DIAGNOSIS — E559 Vitamin D deficiency, unspecified: Secondary | ICD-10-CM | POA: Insufficient documentation

## 2020-06-02 DIAGNOSIS — Z79899 Other long term (current) drug therapy: Secondary | ICD-10-CM | POA: Insufficient documentation

## 2020-06-02 DIAGNOSIS — E210 Primary hyperparathyroidism: Secondary | ICD-10-CM | POA: Diagnosis not present

## 2020-06-02 DIAGNOSIS — K219 Gastro-esophageal reflux disease without esophagitis: Secondary | ICD-10-CM | POA: Diagnosis not present

## 2020-06-02 DIAGNOSIS — Z8639 Personal history of other endocrine, nutritional and metabolic disease: Secondary | ICD-10-CM | POA: Diagnosis present

## 2020-06-02 DIAGNOSIS — Z808 Family history of malignant neoplasm of other organs or systems: Secondary | ICD-10-CM | POA: Insufficient documentation

## 2020-06-02 DIAGNOSIS — Z882 Allergy status to sulfonamides status: Secondary | ICD-10-CM | POA: Diagnosis not present

## 2020-06-02 DIAGNOSIS — Z951 Presence of aortocoronary bypass graft: Secondary | ICD-10-CM | POA: Insufficient documentation

## 2020-06-02 DIAGNOSIS — Z6841 Body Mass Index (BMI) 40.0 and over, adult: Secondary | ICD-10-CM | POA: Insufficient documentation

## 2020-06-02 DIAGNOSIS — Z880 Allergy status to penicillin: Secondary | ICD-10-CM | POA: Diagnosis not present

## 2020-06-02 DIAGNOSIS — E042 Nontoxic multinodular goiter: Secondary | ICD-10-CM | POA: Diagnosis present

## 2020-06-02 DIAGNOSIS — E282 Polycystic ovarian syndrome: Secondary | ICD-10-CM | POA: Insufficient documentation

## 2020-06-02 DIAGNOSIS — J45909 Unspecified asthma, uncomplicated: Secondary | ICD-10-CM | POA: Insufficient documentation

## 2020-06-02 DIAGNOSIS — I2510 Atherosclerotic heart disease of native coronary artery without angina pectoris: Secondary | ICD-10-CM | POA: Diagnosis not present

## 2020-06-02 DIAGNOSIS — Z881 Allergy status to other antibiotic agents status: Secondary | ICD-10-CM | POA: Insufficient documentation

## 2020-06-02 DIAGNOSIS — Z8249 Family history of ischemic heart disease and other diseases of the circulatory system: Secondary | ICD-10-CM | POA: Insufficient documentation

## 2020-06-02 DIAGNOSIS — M5430 Sciatica, unspecified side: Secondary | ICD-10-CM | POA: Insufficient documentation

## 2020-06-02 DIAGNOSIS — I10 Essential (primary) hypertension: Secondary | ICD-10-CM | POA: Diagnosis not present

## 2020-06-02 DIAGNOSIS — Z7984 Long term (current) use of oral hypoglycemic drugs: Secondary | ICD-10-CM | POA: Insufficient documentation

## 2020-06-02 DIAGNOSIS — E782 Mixed hyperlipidemia: Secondary | ICD-10-CM | POA: Diagnosis not present

## 2020-06-02 HISTORY — PX: PARATHYROIDECTOMY: SHX19

## 2020-06-02 HISTORY — PX: THYROIDECTOMY: SHX17

## 2020-06-02 LAB — PREGNANCY, URINE: Preg Test, Ur: NEGATIVE

## 2020-06-02 SURGERY — THYROIDECTOMY
Anesthesia: General

## 2020-06-02 MED ORDER — ALBUTEROL SULFATE HFA 108 (90 BASE) MCG/ACT IN AERS: 2.0000 | INHALATION_SPRAY | Freq: Four times a day (QID) | RESPIRATORY_TRACT | Status: DC | PRN

## 2020-06-02 MED ORDER — 0.9 % SODIUM CHLORIDE (POUR BTL) OPTIME
TOPICAL | Status: DC | PRN
  Administered 2020-06-02: 1000 mL

## 2020-06-02 MED ORDER — ONDANSETRON HCL 4 MG/2ML IJ SOLN: 4.0000 mg | Freq: Four times a day (QID) | INTRAMUSCULAR | Status: DC | PRN

## 2020-06-02 MED ORDER — CIPROFLOXACIN IN D5W 400 MG/200ML IV SOLN
400.0000 mg | INTRAVENOUS | Status: AC
  Administered 2020-06-02: 400 mg via INTRAVENOUS
  Filled 2020-06-02: qty 200

## 2020-06-02 MED ORDER — ONDANSETRON HCL 4 MG/2ML IJ SOLN
INTRAMUSCULAR | Status: DC | PRN
  Administered 2020-06-02: 4 mg via INTRAVENOUS

## 2020-06-02 MED ORDER — ACETAMINOPHEN 10 MG/ML IV SOLN
INTRAVENOUS | Status: AC
  Filled 2020-06-02: qty 100

## 2020-06-02 MED ORDER — ACETAMINOPHEN 650 MG RE SUPP: 650.0000 mg | Freq: Four times a day (QID) | RECTAL | Status: DC | PRN

## 2020-06-02 MED ORDER — DEXAMETHASONE SODIUM PHOSPHATE 10 MG/ML IJ SOLN
INTRAMUSCULAR | Status: DC | PRN
  Administered 2020-06-02: 4 mg via INTRAVENOUS

## 2020-06-02 MED ORDER — LACTATED RINGERS IV SOLN: INTRAVENOUS | Status: DC

## 2020-06-02 MED ORDER — LEVOTHYROXINE SODIUM 100 MCG PO TABS: 200.0000 ug | ORAL_TABLET | Freq: Every day | ORAL | 3 refills | Status: DC

## 2020-06-02 MED ORDER — FENTANYL CITRATE (PF) 100 MCG/2ML IJ SOLN
25.0000 ug | INTRAMUSCULAR | Status: DC | PRN
  Administered 2020-06-02 (×3): 50 ug via INTRAVENOUS

## 2020-06-02 MED ORDER — PROPOFOL 10 MG/ML IV BOLUS
INTRAVENOUS | Status: DC | PRN
  Administered 2020-06-02: 170 mg via INTRAVENOUS
  Administered 2020-06-02: 30 mg via INTRAVENOUS

## 2020-06-02 MED ORDER — PROPOFOL 1000 MG/100ML IV EMUL
INTRAVENOUS | Status: AC
  Filled 2020-06-02: qty 200

## 2020-06-02 MED ORDER — FENTANYL CITRATE (PF) 100 MCG/2ML IJ SOLN
INTRAMUSCULAR | Status: AC
  Filled 2020-06-02: qty 4

## 2020-06-02 MED ORDER — MEPERIDINE HCL 50 MG/ML IJ SOLN: 6.2500 mg | INTRAMUSCULAR | Status: DC | PRN

## 2020-06-02 MED ORDER — PROPOFOL 500 MG/50ML IV EMUL
INTRAVENOUS | Status: DC | PRN
Start: 2020-06-02 — End: 2020-06-02
  Administered 2020-06-02: 150 ug/kg/min via INTRAVENOUS

## 2020-06-02 MED ORDER — ROCURONIUM BROMIDE 10 MG/ML (PF) SYRINGE
PREFILLED_SYRINGE | INTRAVENOUS | Status: AC
  Filled 2020-06-02: qty 10

## 2020-06-02 MED ORDER — SODIUM CHLORIDE 0.45 % IV SOLN: INTRAVENOUS | Status: DC

## 2020-06-02 MED ORDER — ALBUTEROL SULFATE (2.5 MG/3ML) 0.083% IN NEBU: 2.5000 mg | INHALATION_SOLUTION | Freq: Four times a day (QID) | RESPIRATORY_TRACT | Status: DC | PRN

## 2020-06-02 MED ORDER — ACETAMINOPHEN 10 MG/ML IV SOLN
1000.0000 mg | Freq: Once | INTRAVENOUS | Status: DC | PRN
  Administered 2020-06-02: 1000 mg via INTRAVENOUS

## 2020-06-02 MED ORDER — SUCCINYLCHOLINE CHLORIDE 200 MG/10ML IV SOSY
PREFILLED_SYRINGE | INTRAVENOUS | Status: DC | PRN
  Administered 2020-06-02: 120 mg via INTRAVENOUS

## 2020-06-02 MED ORDER — ONDANSETRON HCL 4 MG/2ML IJ SOLN
INTRAMUSCULAR | Status: AC
  Filled 2020-06-02: qty 2

## 2020-06-02 MED ORDER — HYDROMORPHONE HCL 1 MG/ML IJ SOLN: 1.0000 mg | INTRAMUSCULAR | Status: DC | PRN

## 2020-06-02 MED ORDER — CHLORHEXIDINE GLUCONATE CLOTH 2 % EX PADS: 6.0000 | MEDICATED_PAD | Freq: Once | CUTANEOUS | Status: DC

## 2020-06-02 MED ORDER — DEXAMETHASONE SODIUM PHOSPHATE 10 MG/ML IJ SOLN
INTRAMUSCULAR | Status: AC
  Filled 2020-06-02: qty 1

## 2020-06-02 MED ORDER — OXYCODONE HCL 5 MG PO TABS: 5.0000 mg | ORAL_TABLET | ORAL | 0 refills | Status: DC | PRN

## 2020-06-02 MED ORDER — CALCIUM CARBONATE 1250 (500 CA) MG PO TABS
2.0000 | ORAL_TABLET | Freq: Three times a day (TID) | ORAL | Status: DC
  Administered 2020-06-02 – 2020-06-03 (×2): 1000 mg via ORAL
  Filled 2020-06-02 (×3): qty 1

## 2020-06-02 MED ORDER — ACETAMINOPHEN 325 MG PO TABS: 325.0000 mg | ORAL_TABLET | Freq: Once | ORAL | Status: DC | PRN

## 2020-06-02 MED ORDER — ACETAMINOPHEN 325 MG PO TABS
650.0000 mg | ORAL_TABLET | Freq: Four times a day (QID) | ORAL | Status: DC | PRN
  Administered 2020-06-02 – 2020-06-03 (×3): 650 mg via ORAL
  Filled 2020-06-02 (×3): qty 2

## 2020-06-02 MED ORDER — ONDANSETRON 4 MG PO TBDP: 4.0000 mg | ORAL_TABLET | Freq: Four times a day (QID) | ORAL | Status: DC | PRN

## 2020-06-02 MED ORDER — FENTANYL CITRATE (PF) 100 MCG/2ML IJ SOLN
INTRAMUSCULAR | Status: AC
  Filled 2020-06-02: qty 2

## 2020-06-02 MED ORDER — ACETAMINOPHEN 160 MG/5ML PO SOLN: 325.0000 mg | Freq: Once | ORAL | Status: DC | PRN

## 2020-06-02 MED ORDER — ORAL CARE MOUTH RINSE: 15.0000 mL | Freq: Once | OROMUCOSAL | Status: AC

## 2020-06-02 MED ORDER — MIDAZOLAM HCL 2 MG/2ML IJ SOLN
INTRAMUSCULAR | Status: AC
  Filled 2020-06-02: qty 2

## 2020-06-02 MED ORDER — PROPOFOL 1000 MG/100ML IV EMUL
INTRAVENOUS | Status: AC
  Filled 2020-06-02: qty 100

## 2020-06-02 MED ORDER — TRAMADOL HCL 50 MG PO TABS: 50.0000 mg | ORAL_TABLET | Freq: Four times a day (QID) | ORAL | Status: DC | PRN

## 2020-06-02 MED ORDER — CHLORHEXIDINE GLUCONATE 0.12 % MT SOLN
15.0000 mL | Freq: Once | OROMUCOSAL | Status: AC
  Administered 2020-06-02: 15 mL via OROMUCOSAL

## 2020-06-02 MED ORDER — LIDOCAINE 2% (20 MG/ML) 5 ML SYRINGE
INTRAMUSCULAR | Status: AC
  Filled 2020-06-02: qty 5

## 2020-06-02 MED ORDER — OXYCODONE HCL 5 MG PO TABS
5.0000 mg | ORAL_TABLET | ORAL | Status: DC | PRN
  Administered 2020-06-02: 22:00:00 5 mg via ORAL
  Administered 2020-06-02 (×2): 10 mg via ORAL
  Administered 2020-06-03 (×2): 5 mg via ORAL
  Filled 2020-06-02 (×3): qty 1
  Filled 2020-06-02 (×2): qty 2

## 2020-06-02 MED ORDER — MIDAZOLAM HCL 2 MG/2ML IJ SOLN
INTRAMUSCULAR | Status: DC | PRN
  Administered 2020-06-02: 2 mg via INTRAVENOUS

## 2020-06-02 MED ORDER — ROCURONIUM BROMIDE 10 MG/ML (PF) SYRINGE
PREFILLED_SYRINGE | INTRAVENOUS | Status: DC | PRN
  Administered 2020-06-02: 15 mg via INTRAVENOUS
  Administered 2020-06-02: 50 mg via INTRAVENOUS
  Administered 2020-06-02: 20 mg via INTRAVENOUS

## 2020-06-02 MED ORDER — PROMETHAZINE HCL 25 MG/ML IJ SOLN: 6.2500 mg | INTRAMUSCULAR | Status: DC | PRN

## 2020-06-02 MED ORDER — LIDOCAINE 2% (20 MG/ML) 5 ML SYRINGE
INTRAMUSCULAR | Status: DC | PRN
  Administered 2020-06-02: 40 mg via INTRAVENOUS

## 2020-06-02 MED ORDER — FENTANYL CITRATE (PF) 250 MCG/5ML IJ SOLN
INTRAMUSCULAR | Status: DC | PRN
  Administered 2020-06-02 (×2): 50 ug via INTRAVENOUS
  Administered 2020-06-02: 100 ug via INTRAVENOUS
  Administered 2020-06-02 (×2): 50 ug via INTRAVENOUS

## 2020-06-02 MED ORDER — PROPOFOL 10 MG/ML IV BOLUS
INTRAVENOUS | Status: AC
  Filled 2020-06-02: qty 20

## 2020-06-02 SURGICAL SUPPLY — 34 items
ADH SKN CLS APL DERMABOND .7 (GAUZE/BANDAGES/DRESSINGS) ×2
APL PRP STRL LF DISP 70% ISPRP (MISCELLANEOUS) ×2
ATTRACTOMAT 16X20 MAGNETIC DRP (DRAPES) ×3 IMPLANT
BLADE SURG 15 STRL LF DISP TIS (BLADE) ×2 IMPLANT
BLADE SURG 15 STRL SS (BLADE) ×3
CHLORAPREP W/TINT 26 (MISCELLANEOUS) ×3 IMPLANT
CLIP VESOCCLUDE MED 6/CT (CLIP) ×9 IMPLANT
CLIP VESOCCLUDE SM WIDE 6/CT (CLIP) ×10 IMPLANT
COVER SURGICAL LIGHT HANDLE (MISCELLANEOUS) ×3 IMPLANT
COVER WAND RF STERILE (DRAPES) ×3 IMPLANT
DERMABOND ADVANCED (GAUZE/BANDAGES/DRESSINGS) ×1
DERMABOND ADVANCED .7 DNX12 (GAUZE/BANDAGES/DRESSINGS) ×2 IMPLANT
DRAPE LAPAROTOMY T 98X78 PEDS (DRAPES) ×3 IMPLANT
ELECT PENCIL ROCKER SW 15FT (MISCELLANEOUS) ×3 IMPLANT
ELECT REM PT RETURN 15FT ADLT (MISCELLANEOUS) ×3 IMPLANT
GAUZE 4X4 16PLY RFD (DISPOSABLE) ×3 IMPLANT
GLOVE SURG ORTHO 8.0 STRL STRW (GLOVE) ×3 IMPLANT
GOWN STRL REUS W/TWL XL LVL3 (GOWN DISPOSABLE) ×9 IMPLANT
HEMOSTAT SURGICEL 2X4 FIBR (HEMOSTASIS) ×3 IMPLANT
ILLUMINATOR WAVEGUIDE N/F (MISCELLANEOUS) ×3 IMPLANT
KIT BASIN OR (CUSTOM PROCEDURE TRAY) ×3 IMPLANT
KIT TURNOVER KIT A (KITS) IMPLANT
NDL HYPO 25X1 1.5 SAFETY (NEEDLE) ×2 IMPLANT
NEEDLE HYPO 25X1 1.5 SAFETY (NEEDLE) ×3 IMPLANT
PACK BASIC VI WITH GOWN DISP (CUSTOM PROCEDURE TRAY) ×3 IMPLANT
PENCIL SMOKE EVACUATOR (MISCELLANEOUS) ×3 IMPLANT
SHEARS HARMONIC 9CM CVD (BLADE) ×3 IMPLANT
SUT MNCRL AB 4-0 PS2 18 (SUTURE) ×3 IMPLANT
SUT VIC AB 3-0 SH 18 (SUTURE) ×6 IMPLANT
SYR BULB IRRIG 60ML STRL (SYRINGE) ×3 IMPLANT
SYR CONTROL 10ML LL (SYRINGE) ×3 IMPLANT
TOWEL OR 17X26 10 PK STRL BLUE (TOWEL DISPOSABLE) ×3 IMPLANT
TOWEL OR NON WOVEN STRL DISP B (DISPOSABLE) ×3 IMPLANT
TUBING CONNECTING 10 (TUBING) ×3 IMPLANT

## 2020-06-02 NOTE — Interval H&P Note (Signed)
History and Physical Interval Note:  06/02/2020 9:00 AM  Olivia [name]  has presented today for surgery, with the diagnosis of primary hyperparaythyroidism MULTIPLE THYROID NODULES.  The various methods of treatment have been discussed with the patient and family. After consideration of risks, benefits and other options for treatment, the patient has consented to    Procedure(s): TOTAL THYROIDECTOMY (N/A) LEFT INFERIOR PARATHYROIDECTOMY (Left) as a surgical intervention.    The patient's history has been reviewed, patient examined, no change in status, stable for surgery.  I have reviewed the patient's chart and labs.  Questions were answered to the patient's satisfaction.    Armandina Gemma, MD [facility] Office: [place]

## 2020-06-02 NOTE — Anesthesia Preprocedure Evaluation (Addendum)
Anesthesia Evaluation  Patient identified by MRN, date of birth, ID band Patient awake    Reviewed: Allergy & Precautions, NPO status , Patient's Chart, lab work & pertinent test results  History of Anesthesia Complications (+) PONV and history of anesthetic complications  Airway Mallampati: I  TM Distance: <3 FB Neck ROM: Full    Dental  (+) Teeth Intact, Dental Advisory Given   Pulmonary asthma ,    breath sounds clear to auscultation       Cardiovascular hypertension, Pt. on home beta blockers + CAD   Rhythm:Regular Rate:Normal     Neuro/Psych  Headaches, PSYCHIATRIC DISORDERS Anxiety Depression  Neuromuscular disease    GI/Hepatic Neg liver ROS, hiatal hernia, GERD  Medicated,  Endo/Other  negative endocrine ROS  Renal/GU negative Renal ROS     Musculoskeletal negative musculoskeletal ROS (+)   Abdominal (+) + obese,   Peds  Hematology negative hematology ROS (+)   Anesthesia Other Findings   Reproductive/Obstetrics                            Anesthesia Physical Anesthesia Plan  ASA: III  Anesthesia Plan: General   Post-op Pain Management:    Induction: Intravenous  PONV Risk Score and Plan: 4 or greater and Ondansetron, Dexamethasone, Midazolam, Scopolamine patch - Pre-op and TIVA  Airway Management Planned: Oral ETT  Additional Equipment: None  Intra-op Plan:   Post-operative Plan:   Informed Consent: I have reviewed the patients History and Physical, chart, labs and discussed the procedure including the risks, benefits and alternatives for the proposed anesthesia with the patient or authorized representative who has indicated his/her understanding and acceptance.     Dental advisory given  Plan Discussed with: CRNA  Anesthesia Plan Comments: (Echo:  - Left ventricle: The cavity size was normal. Systolic function was  normal. The estimated ejection fraction  was in the range of 55%  to 60%. Wall motion was normal; there were no regional wall  motion abnormalities. Left ventricular diastolic function  parameters were normal.  - Aortic valve: There was no regurgitation.  - Mitral valve: There was trivial regurgitation.  - Right ventricle: Systolic function was normal.  - Atrial septum: No defect or patent foramen ovale was identified.  - Tricuspid valve: There was moderate regurgitation.  - Pulmonic valve: There was no significant regurgitation.  - Pulmonary arteries: Systolic pressure was within the normal  range. )      Anesthesia Quick Evaluation

## 2020-06-02 NOTE — Discharge Instructions (Signed)
[facility]  THYROID & PARATHYROID SURGERY:  POST-OP INSTRUCTIONS  Always review your discharge instruction sheet from the facility where your surgery was performed.  A prescription for pain medication may be given to you upon discharge.  Take your pain medication as prescribed.  If narcotic pain medicine is not needed, then you may take acetaminophen (Tylenol) or ibuprofen (Advil) as needed.  Take your usually prescribed medications unless otherwise directed.  If you need a refill on your pain medication, please contact our office during regular business hours.  Prescriptions cannot be processed by our office after 5 pm or on weekends.  Start with a light diet upon arrival home, such as soup and crackers or toast.  Be sure to drink plenty of fluids daily.  Resume your normal diet the day after surgery.  Most patients will experience some swelling and bruising on the chest and neck area.  Ice packs will help.  Swelling and bruising can take several days to resolve.   It is common to experience some constipation after surgery.  Increasing fluid intake and taking a stool softener (Colace) will usually help or prevent this problem.  A mild laxative (Milk of Magnesia or Miralax) should be taken according to package directions if there has been no bowel movement after 48 hours.  You have steri-strips and a gauze dressing over your incision.  You may remove the gauze bandage on the second day after surgery, and you may shower at that time.  Leave your steri-strips (small skin tapes) in place directly over the incision.  These strips should remain on the skin for 5-7 days and then be removed.  You may get them wet in the shower and pat them dry.  You may resume regular (light) daily activities beginning the next day (such as daily self-care, walking, climbing stairs) gradually increasing activities as tolerated.  You may have sexual intercourse when it is comfortable.  Refrain from  any heavy lifting or straining until approved by your doctor.  You may drive when you no longer are taking prescription pain medication, you can comfortably wear a seatbelt, and you can safely maneuver your car and apply brakes.  You should see your doctor in the office for a follow-up appointment approximately three weeks after your surgery.  Make sure that you call for this appointment within a day or two after you arrive home to insure a convenient appointment time.  WHEN TO CALL YOUR DOCTOR: -- Fever greater than 101.5 -- Inability to urinate -- Nausea and/or vomiting - persistent -- Extreme swelling or bruising -- Continued bleeding from incision -- Increased pain, redness, or drainage from the incision -- Difficulty swallowing or breathing -- Muscle cramping or spasms -- Numbness or tingling in hands or around lips  The clinic staff is available to answer your questions during regular business hours.  Please don't hesitate to call and ask to speak to one of the nurses if you have concerns.  Isahi Godwin, MD [facility] Office: [phone] 

## 2020-06-02 NOTE — Op Note (Signed)
Procedure Note  Pre-operative Diagnosis:  Multiple thyroid nodules, primary hyperparathyroidism  Post-operative Diagnosis:  same  Surgeon:  Armandina Gemma, MD  Assistant:  none   Procedure:  Total thyroidectomy with neck exploration  Anesthesia:  General  Estimated Blood Loss:  minimal  Drains: none         Specimen: thyroid to pathology  Indications:  Patient is referred by Dr. Dagmar Hait for surgical evaluation and management of newly diagnosed primary hyperparathyroidism. Also, the patient has multiple thyroid nodules. She has a right thyroid nodule measuring 2.6 cm which is undergone 2 attempts at fine-needle aspiration biopsy with insufficient material for diagnosis obtained on both occasions. Patient was diagnosed with hypercalcemia approximately 2 and half years ago. Additional testing has shown an elevated calcium level recently of 11.3, and on suppressed PTH level of 54, and an elevated 24-hour urinary calcium of 418. Patient underwent nuclear medicine parathyroid scan on February 08, 2020. This localized a left inferior parathyroid adenoma. Patient also underwent an ultrasound evaluation of the neck. This was performed on February 04, 2020. There was a 0.95 cm nodule in the left inferior position which may correspond with the signal seen on sestamibi scan and represent the parathyroid adenoma. Patient also has other thyroid nodules. In the right inferior lobe there is a 2.6 cm nodule which was isoechoic and biopsy was recommended. 2 attempts were insufficient for diagnosis. Patient presents today to discuss parathyroidectomy as well as possible thyroid surgery. Patient has noted fatigue. She has noted mental fogginess. She has bone and joint discomfort. She has had bone density scan is showing bone loss. She denies nephrolithiasis. Patient has had no prior head or neck surgery. She has never been on thyroid medication. Her most recent TSH level was normal at 1.68. She does  have a family history of thyroid cancer in her sister who underwent surgery approximately 4 years ago. 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.  Procedure Details: Procedure was done in OR #1 at the [facility]. The patient was brought to the operating room and placed in a supine position on the operating room table. Following administration of general anesthesia, the patient was positioned and then prepped and draped in the usual aseptic fashion. After ascertaining that an adequate level of anesthesia had been achieved, a small Kocher incision was made with #15 blade. Dissection was carried through subcutaneous tissues and platysma.Hemostasis was achieved with the electrocautery. Skin flaps were elevated cephalad and caudad from the thyroid notch to the sternal notch. A [place] self-retaining retractor was placed for exposure. Strap muscles were incised in the midline and dissection was begun on the left side.  Strap muscles were reflected laterally.  Left thyroid lobe was slightly enlarged with multiple nodules.  The left lobe was gently mobilized with blunt dissection. Superior pole vessels were dissected out and divided individually between small and medium ligaclips with the harmonic scalpel. The thyroid lobe was rolled anteriorly. Branches of the inferior thyroid artery were divided between small ligaclips with the harmonic scalpel. Inferior venous tributaries were divided between ligaclips. The superior parathyroid glands was identified and preserved on its vascular pedicle. The recurrent laryngeal nerve was identified and preserved along its course. The ligament of Gwenlyn Found was released with the electrocautery and the gland was mobilized onto the anterior trachea. Isthmus was mobilized across the midline. There was a small pyramidal lobe present which was resected with the isthmus. Dry pack was placed in the left neck.  The right thyroid lobe was gently mobilized with  blunt dissection. Right thyroid lobe was mildly enlarged and multinodular. Superior pole vessels were dissected out and divided between small and medium ligaclips with the Harmonic scalpel. Superior parathyroid was identified and preserved on its vascular pedicle. Inferior venous tributaries were divided between medium ligaclips with the harmonic scalpel. The right thyroid lobe was rolled anteriorly and the branches of the inferior thyroid artery divided between small ligaclips. The right recurrent laryngeal nerve was identified and preserved along its course. The ligament of Gwenlyn Found was released with the electrocautery. The right thyroid lobe was mobilized onto the anterior trachea and the remainder of the thyroid was dissected off the anterior trachea and the thyroid was completely excised. A suture was used to mark the left lobe. The entire thyroid gland was submitted to pathology for review.  The neck was thoroughly explored in hopes of identifying a an enlarged parathyroid consistent with parathyroid adenoma.  The left and right parathyroid glands in the superior positions were normal and were maintained on their vascular pedicle.  Neither of the inferior glands were identified.  We inspected the thyroid gland closely for any sign of parathyroid tissue on the surface.  The thyroid is submitted to pathology and I discussed with Dr. Vicente Males that AN adenoma had not been identified in the neck and that I would like the thyroid gland to be thoroughly evaluated for possible intrathyroidal parathyroid tissue.  Carotid sheath was explored bilaterally without evidence of adenoma.  The esophagus was dissected out bilaterally without evidence of adenoma.  The thyroid thymic tract was explored bilaterally without evidence of adenoma.  At this point a decision was made to conclude the procedure and await evaluation of the thyroid gland by pathology.  The neck was irrigated with warm saline. Fibrillar was placed  throughout the operative field. Strap muscles were approximated in the midline with interrupted 3-0 Vicryl sutures. Platysma was closed with interrupted 3-0 Vicryl sutures. Skin was closed with a running 4-0 Monocryl subcuticular suture. Wound was washed and Dermabond was applied. The patient was awakened from anesthesia and brought to the recovery room. The patient tolerated the procedure well.   Armandina Gemma, MD [facility] Office: [phone]

## 2020-06-02 NOTE — Transfer of Care (Signed)
Immediate Anesthesia Transfer of Care Note  Patient: Olivia [name]  Procedure(s) Performed: TOTAL THYROIDECTOMY (N/A ) LEFT INFERIOR PARATHYROIDECTOMY (Left )  Patient Location: PACU  Anesthesia Type:General  Level of Consciousness: drowsy and patient cooperative  Airway & Oxygen Therapy: Patient Spontanous Breathing and Patient connected to face mask oxygen  Post-op Assessment: Report given to RN and Post -op Vital signs reviewed and stable  Post vital signs: Reviewed and stable  Last Vitals:  Vitals Value Taken Time  BP    Temp    Pulse 81 06/02/20 1153  Resp 23 06/02/20 1153  SpO2 100 % 06/02/20 1153  Vitals shown include unvalidated device data.  Last Pain:  Vitals:   06/02/20 0809  TempSrc: Oral  PainSc:       Patients Stated Pain Goal: 4 (94/58/59 2924)  Complications: No complications documented.

## 2020-06-02 NOTE — Anesthesia Postprocedure Evaluation (Signed)
Anesthesia Post Note  Patient: Olivia [name]  Procedure(s) Performed: TOTAL THYROIDECTOMY (N/A ) LEFT INFERIOR PARATHYROIDECTOMY (Left )     Patient location during evaluation: PACU Anesthesia Type: General Level of consciousness: awake and alert Pain management: pain level controlled Vital Signs Assessment: post-procedure vital signs reviewed and stable Respiratory status: spontaneous breathing, nonlabored ventilation, respiratory function stable and patient connected to nasal cannula oxygen Cardiovascular status: blood pressure returned to baseline and stable Postop Assessment: no apparent nausea or vomiting Anesthetic complications: no   No complications documented.  Last Vitals:  Vitals:   06/02/20 1300 06/02/20 1333  BP: (!) 140/80 (!) 140/85  Pulse: 66 75  Resp: 17 17  Temp: 36.6 C 36.6 C  SpO2: 99% 98%    Last Pain:  Vitals:   06/02/20 1333  TempSrc: Oral  PainSc: 4                  Effie Berkshire

## 2020-06-02 NOTE — Anesthesia Procedure Notes (Signed)
Procedure Name: Intubation Date/Time: 06/02/2020 9:35 AM Performed by: Eben Burow, CRNA Pre-anesthesia Checklist: Patient identified, Emergency Drugs available, Suction available, Patient being monitored and Timeout performed Patient Re-evaluated:Patient Re-evaluated prior to induction Oxygen Delivery Method: Circle system utilized Preoxygenation: Pre-oxygenation with 100% oxygen Induction Type: IV induction Ventilation: Mask ventilation without difficulty Laryngoscope Size: Mac and 4 Grade View: Grade I Tube type: Oral Number of attempts: 1 Airway Equipment and Method: Stylet Placement Confirmation: ETT inserted through vocal cords under direct vision,  positive ETCO2 and breath sounds checked- equal and bilateral Secured at: 22 cm Tube secured with: Tape Dental Injury: Teeth and Oropharynx as per pre-operative assessment

## 2020-06-03 ENCOUNTER — Encounter (HOSPITAL_COMMUNITY): Payer: Self-pay | Admitting: Surgery

## 2020-06-03 DIAGNOSIS — E210 Primary hyperparathyroidism: Secondary | ICD-10-CM | POA: Diagnosis not present

## 2020-06-03 LAB — BASIC METABOLIC PANEL
Anion gap: 9 (ref 5–15)
BUN: 12 mg/dL (ref 6–20)
CO2: 25 mmol/L (ref 22–32)
Calcium: 9.8 mg/dL (ref 8.9–10.3)
Chloride: 103 mmol/L (ref 98–111)
Creatinine, Ser: 0.69 mg/dL (ref 0.44–1.00)
GFR calc Af Amer: >60 mL/min (ref >60)
GFR calc non Af Amer: >60 mL/min (ref >60)
Glucose, Bld: 128 mg/dL — ABNORMAL HIGH (ref 70–99)
Potassium: 3.8 mmol/L (ref 3.5–5.1)
Sodium: 137 mmol/L (ref 135–145)

## 2020-06-03 NOTE — Progress Notes (Signed)
Subjective No acute events. Doing well. Mild globus sensation. Denies n/v. Tolerating liquids and solids. Pain well controlled.   Objective: Vital signs in last 24 hours: Temp:  [97.7 F (36.5 C)-98.6 F (37 C)] 97.9 F (36.6 C) (07/31 0508) Pulse Rate:  [64-81] 64 (07/31 0508) Resp:  [11-25] 18 (07/31 0508) BP: (122-147)/(66-108) 122/78 (07/31 0508) SpO2:  [96 %-100 %] 97 % (07/31 0508) Last BM Date: 06/01/20  Intake/Output from previous day: 07/30 0701 - 07/31 0700 In: 2230.1 [I.V.:2030.1; IV Piggyback:200] Out: 1610 [Urine:3400; Blood:20] Intake/Output this shift: No intake/output data recorded.  Gen: NAD, comfortable; no hoarseness CV: RRR Pulm: Normal work of breathing Neck: Incision c/d/i without erythema, neck is soft. Minimal swelling Ext: SCDs in place  Lab Results: CBC  No results for input(s): WBC, HGB, HCT, PLT in the last 72 hours. BMET Recent Labs    06/03/20 0541  NA 137  K 3.8  CL 103  CO2 25  GLUCOSE 128*  BUN 12  CREATININE 0.69  CALCIUM 9.8   PT/INR No results for input(s): LABPROT, INR in the last 72 hours. ABG No results for input(s): PHART, HCO3 in the last 72 hours.  Invalid input(s): PCO2, PO2  Studies/Results:  Anti-infectives: Anti-infectives (From admission, onward)   Start     Dose/Rate Route Frequency Ordered Stop   06/02/20 0745  ciprofloxacin (CIPRO) IVPB 400 mg        400 mg 200 mL/hr over 60 Minutes Intravenous On call to O.R. 06/02/20 0730 06/02/20 0936       Assessment/Plan: Patient Active Problem List   Diagnosis Date Noted  . Hyperparathyroidism, primary ([place]) 05/22/2020  . Ventricular bigeminy 11/17/2018  . Major depression, recurrent, chronic ([place]) 11/17/2018  . Multiple thyroid nodules 08/19/2018  . Atopic dermatitis 08/19/2018  . Elevated coronary artery calcium score, 90th percentile for age and gender 08/16/2018  . Vitamin D deficiency 01/05/2018  . Psoriasis 01/05/2018  . Chronic low back pain with  sciatica 01/05/2018  . IUD (intrauterine device) in place, [place] 12/07/2017  . PCOS (polycystic ovarian syndrome) 10/25/2014  . Migraine with aura 01/11/2014  . History of laparoscopic adjustable gastric banding 05/08/2012  . Mixed hyperlipidemia 04/15/2008  . Morbid obesity ([place]) 04/15/2008  . Allergic rhinitis 04/15/2008  . Allergic asthma 04/15/2008   s/p Procedure(s): TOTAL THYROIDECTOMY LEFT INFERIOR PARATHYROIDECTOMY 06/02/2020  Calcium level now normal Feeling well Comfortable with discharge home today Medication prescriptions have been sent electronically by Dr. Harlow Asa   LOS: 0 days   Sharon Mt. Dema Severin, M.D. [facility] Use [email or web address] to contact on call provider

## 2020-06-03 NOTE — Discharge Summary (Signed)
Patient ID: Olivia [name] MRN: [id number] DOB/AGE: 1965/12/02 54 y.o.  Admit date: 06/02/2020 Discharge date: 06/03/2020  Discharge Diagnoses Patient Active Problem List   Diagnosis Date Noted  . Hyperparathyroidism, primary ([place]) 05/22/2020  . Ventricular bigeminy 11/17/2018  . Major depression, recurrent, chronic ([place]) 11/17/2018  . Multiple thyroid nodules 08/19/2018  . Atopic dermatitis 08/19/2018  . Elevated coronary artery calcium score, 90th percentile for age and gender 08/16/2018  . Vitamin D deficiency 01/05/2018  . Psoriasis 01/05/2018  . Chronic low back pain with sciatica 01/05/2018  . IUD (intrauterine device) in place, Liletta 12/07/2017  . PCOS (polycystic ovarian syndrome) 10/25/2014  . Migraine with aura 01/11/2014  . History of laparoscopic adjustable gastric banding 05/08/2012  . Mixed hyperlipidemia 04/15/2008  . Morbid obesity ([place]) 04/15/2008  . Allergic rhinitis 04/15/2008  . Allergic asthma 04/15/2008   Hospital Course: OR 7/30: Pre-operative Diagnosis:  Multiple thyroid nodules, primary hyperparathyroidism  Post-operative Diagnosis:  same  Surgeon:  Armandina Gemma, MD  Assistant:  none          Procedure:  Total thyroidectomy with neck exploration  She was admitted postoperatively where she recovered well. Tolerating liquids and solids. Pain controlled. Some subjective swelling in neck but improved much after ice pack. Calcium normalized. On 06/03/20 she was deemed stable for discharge home    Allergies as of 06/03/2020      Reactions   Cephalosporins Anaphylaxis, Other (See Comments)   Caused hypotension and a fever   Eggs Or Egg-derived Products Hives, Swelling, Other (See Comments)   Angioedema   Penicillins Anaphylaxis   Has patient had a PCN reaction causing immediate rash, facial/tongue/throat swelling, SOB or lightheadedness with hypotension: Yes Has patient had a PCN reaction causing severe rash involving mucus membranes or skin  necrosis: No Has patient had a PCN reaction that required hospitalization: Observed X12 hrs Has patient had a PCN reaction occurring within the last 10 years: No If all of the above answers are "NO", then may proceed with Cephalosporin use.   Sulfa Drugs Cross Reactors Other (See Comments)   Liver failure   Adhesive [tape] Hives, Itching   Cannot wear for any length of time   Effexor [venlafaxine] Other (See Comments)   Lightheadedness, dizziness, and headaches resulted      Medication List    TAKE these medications   albuterol (2.5 MG/3ML) 0.083% nebulizer solution Commonly known as: PROVENTIL Take 3 mLs (2.5 mg total) by nebulization every 6 (six) hours as needed for wheezing or shortness of breath.   albuterol 108 (90 Base) MCG/ACT inhaler Commonly known as: Proventil HFA Inhale 2 puffs into the lungs every 6 (six) hours as needed for wheezing or shortness of breath.   cetirizine 10 MG tablet Commonly known as: ZYRTEC Take 10 mg by mouth daily as needed for allergies.   diclofenac sodium 1 % Gel Commonly known as: Voltaren Apply topically to affected area qid What changed:   how much to take  how to take this  when to take this  reasons to take this  additional instructions   famotidine 20 MG tablet Commonly known as: PEPCID Take 20 mg by mouth daily as needed for heartburn or indigestion.   ferrous sulfate 325 (65 FE) MG EC tablet Take 325 mg by mouth daily with breakfast.   Fish Oil 1000 MG Caps Take 1,000 mg by mouth daily.   Flovent HFA 110 MCG/ACT inhaler Generic drug: fluticasone Inhale 2 puffs into the lungs in the morning  and at bedtime. What changed:   when to take this  reasons to take this   levothyroxine 100 MCG tablet Commonly known as: Synthroid Take 2 tablets (200 mcg total) by mouth daily.   magnesium 30 MG tablet Take 30 mg by mouth daily.   metoprolol tartrate 25 MG tablet Commonly known as: LOPRESSOR TAKE 1/2 TABLET BY MOUTH  2 TIMES DAILY or as directed. What changed:   how much to take  how to take this  when to take this   multivitamin with minerals Tabs tablet Take 1 tablet by mouth daily.   oxyCODONE 5 MG immediate release tablet Commonly known as: Oxy IR/ROXICODONE Take 1-2 tablets (5-10 mg total) by mouth every 4 (four) hours as needed for moderate pain.   QC TUMERIC COMPLEX PO Take 1 capsule by mouth daily.   traZODone 50 MG tablet Commonly known as: DESYREL Take 0.5-1 tablets (25-50 mg total) by mouth at bedtime as needed for sleep.   Vitamin D (Cholecalciferol) 50 MCG (2000 UT) Caps Take 2,000 Units by mouth daily.         Follow-up Information    Armandina Gemma, MD. Schedule an appointment as soon as possible for a visit in 2 weeks.   Specialty: General Surgery Why: For wound re-check and lab work Contact information: [address] [place] [address] [place]. Dema Severin, M.D. [facility]

## 2020-06-03 NOTE — Progress Notes (Signed)
Discussed discharge instructions with patient.  Answered all questions.  Walked patient to main exit.

## 2020-06-05 LAB — SURGICAL PATHOLOGY

## 2020-06-05 NOTE — Progress Notes (Signed)
Please contact patient and notify of benign pathology results.  [name] M. Gohan Collister, MD, FACS [facility] Office: [phone]   

## 2020-07-05 ENCOUNTER — Encounter: Payer: Self-pay | Admitting: Family Medicine

## 2020-07-05 ENCOUNTER — Ambulatory Visit (INDEPENDENT_AMBULATORY_CARE_PROVIDER_SITE_OTHER): Payer: No Typology Code available for payment source | Admitting: Family Medicine

## 2020-07-05 ENCOUNTER — Other Ambulatory Visit: Payer: Self-pay

## 2020-07-05 VITALS — BP 128/80 | HR 84 | Temp 98.0°F | Ht 68.0 in | Wt 341.2 lb

## 2020-07-05 DIAGNOSIS — F430 Acute stress reaction: Secondary | ICD-10-CM | POA: Diagnosis not present

## 2020-07-05 DIAGNOSIS — Z9889 Other specified postprocedural states: Secondary | ICD-10-CM | POA: Diagnosis not present

## 2020-07-05 DIAGNOSIS — E890 Postprocedural hypothyroidism: Secondary | ICD-10-CM

## 2020-07-05 DIAGNOSIS — F5102 Adjustment insomnia: Secondary | ICD-10-CM

## 2020-07-05 DIAGNOSIS — M5440 Lumbago with sciatica, unspecified side: Secondary | ICD-10-CM | POA: Diagnosis not present

## 2020-07-05 DIAGNOSIS — E282 Polycystic ovarian syndrome: Secondary | ICD-10-CM

## 2020-07-05 DIAGNOSIS — N951 Menopausal and female climacteric states: Secondary | ICD-10-CM

## 2020-07-05 DIAGNOSIS — G8929 Other chronic pain: Secondary | ICD-10-CM

## 2020-07-05 MED ORDER — ESCITALOPRAM OXALATE 10 MG PO TABS: 10.0000 mg | ORAL_TABLET | Freq: Every day | ORAL | 2 refills | Status: DC

## 2020-07-05 MED FILL — ESCITALOPRAM 10 MG TABLET: 10 | 30 days supply | Qty: 30 | Fill #0

## 2020-07-05 NOTE — Patient Instructions (Signed)
Please return in 6 weeks to recheck stress/sleep.  If you have any questions or concerns, please don't hesitate to send me a message via MyChart or call the office at [phone]. Thank you for visiting with [place] today! It's our pleasure caring for you.   Stress, Adult Stress is a normal reaction to life events. Stress is what you feel when life demands more than you are used to, or more than you think you can handle. Some stress can be useful, such as studying for a test or meeting a deadline at work. Stress that occurs too often or for too long can cause problems. It can affect your emotional health and interfere with relationships and normal daily activities. Too much stress can weaken your body's defense system (immune system) and increase your risk for physical illness. If you already have a medical problem, stress can make it worse. What are the causes? All sorts of life events can cause stress. An event that causes stress for one person may not be stressful for another person. Major life events, whether positive or negative, commonly cause stress. Examples include:  Losing a job or starting a new job.  Losing a loved one.  Moving to a new town or home.  Getting married or divorced.  Having a baby.  Getting injured or sick. Less obvious life events can also cause stress, especially if they occur day after day or in combination with each other. Examples include:  Working long hours.  Driving in traffic.  Caring for children.  Being in debt.  Being in a difficult relationship. What are the signs or symptoms? Stress can cause emotional symptoms, including:  Anxiety. This is feeling worried, afraid, on edge, overwhelmed, or out of control.  Anger, including irritation or impatience.  Depression. This is feeling sad, down, helpless, or guilty.  Trouble focusing, remembering, or making decisions. Stress can cause physical symptoms, including:  Aches and pains. These may  affect your head, neck, back, stomach, or other areas of your body.  Tight muscles or a clenched jaw.  Low energy.  Trouble sleeping. Stress can cause unhealthy behaviors, including:  Eating to feel better (overeating) or skipping meals.  Working too much or putting off tasks.  Smoking, drinking alcohol, or using drugs to feel better. How is this diagnosed? Stress is diagnosed through an assessment by your health care provider. He or she may diagnose this condition based on:  Your symptoms and any stressful life events.  Your medical history.  Tests to rule out other causes of your symptoms. Depending on your condition, your health care provider may refer you to a specialist for further evaluation. How is this treated?  Stress management techniques are the recommended treatment for stress. Medicine is not typically recommended for the treatment of stress. Techniques to reduce your reaction to stressful life events include:  Stress identification. Monitor yourself for symptoms of stress and identify what causes stress for you. These skills may help you to avoid or prepare for stressful events.  Time management. Set your priorities, keep a calendar of events, and learn to say no. Taking these actions can help you avoid making too many commitments. Techniques for coping with stress include:  Rethinking the problem. Try to think realistically about stressful events rather than ignoring them or overreacting. Try to find the positives in a stressful situation rather than focusing on the negatives.  Exercise. Physical exercise can release both physical and emotional tension. The key is to find a form  of exercise that you enjoy and do it regularly.  Relaxation techniques. These relax the body and mind. The key is to find one or more that you enjoy and use the techniques regularly. Examples include: ? Meditation, deep breathing, or progressive relaxation techniques. ? Yoga or tai  chi. ? Biofeedback, mindfulness techniques, or journaling. ? Listening to music, being out in nature, or participating in other hobbies.  Practicing a healthy lifestyle. Eat a balanced diet, drink plenty of water, limit or avoid caffeine, and get plenty of sleep.  Having a strong support network. Spend time with family, friends, or other people you enjoy being around. Express your feelings and talk things over with someone you trust. Counseling or talk therapy with a mental health professional may be helpful if you are having trouble managing stress on your own. Follow these instructions at home: Lifestyle   Avoid drugs.  Do not use any products that contain nicotine or tobacco, such as cigarettes, e-cigarettes, and chewing tobacco. If you need help quitting, ask your health care provider.  Limit alcohol intake to no more than 1 drink a day for nonpregnant women and 2 drinks a day for men. One drink equals 12 oz of beer, 5 oz of wine, or 1 oz of hard liquor  Do not use alcohol or drugs to relax.  Eat a balanced diet that includes fresh fruits and vegetables, whole grains, lean meats, fish, eggs, and beans, and low-fat dairy. Avoid processed foods and foods high in added fat, sugar, and salt.  Exercise at least 30 minutes on 5 or more days each week.  Get 7-8 hours of sleep each night. General instructions   Practice stress management techniques as discussed with your health care provider.  Drink enough fluid to keep your urine clear or pale yellow.  Take over-the-counter and prescription medicines only as told by your health care provider.  Keep all follow-up visits as told by your health care provider. This is important. Contact a health care provider if:  Your symptoms get worse.  You have new symptoms.  You feel overwhelmed by your problems and can no longer manage them on your own. Get help right away if:  You have thoughts of hurting yourself or others. If you ever  feel like you may hurt yourself or others, or have thoughts about taking your own life, get help right away. You can go to your nearest emergency department or call:  Your local emergency services (911 in the [place]  A suicide crisis helpline, such as the [place] at 830-758-7512. This is open 24 hours a day. Summary  Stress is a normal reaction to life events. It can cause problems if it happens too often or for too long.  Practicing stress management techniques is the best way to treat stress.  Counseling or talk therapy with a mental health professional may be helpful if you are having trouble managing stress on your own. This information is not intended to replace advice given to you by your health care provider. Make sure you discuss any questions you have with your health care provider. Document Revised: 05/21/2019 Document Reviewed: 12/11/2016 Elsevier Patient Education  [place].

## 2020-07-05 NOTE — Progress Notes (Signed)
Subjective  CC:  Chief Complaint  Patient presents with   Hyperlipidemia   Post-op Problem    thyroid and parathyroid removal 1 month ago, still struggling with voice strain   Insomnia    was happy with Trazadone - did improve sleep - but now thinks she needs a stronger dose, waking up more often     HPI: Olivia [name] is a 54 y.o. female who presents to the office today to address the problems listed above in the chief complaint.  Status post complete thyroidectomy and parathyroidectomy no overall doing well.  Continues to have some hoarseness but this is improving overall.  She had follow-up with surgery yesterday, unfortunately pathology could not find parathyroid glands but calcium levels have normalized so he believes she should be okay.  They will have to monitor her calcium levels.  She is happy to have the surgery behind her.  Adjustment insomnia: Did very well with the initiation of trazodone.  Post surgery and recovery, being out of work, was also very helpful.  However starting to awaken in the middle the night again.  She is back to work.  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  Also with perimenopausal symptoms including an irregular menstrual cycle with breast tenderness and irritability.  GYN is evaluating this.  She does have an IUD in place.  She has no history of anxiety disorder although she has had depression in the past.  About 2 to 3 years ago she went to [facility] due to work stressors.  She did not find that helpful.  In looking back, she does struggle with some anxiety symptoms.  She typically feels like she can handle that behaviorally.  She has never been on mood medications.  No significant depressive symptoms now.  Obesity: Exacerbating her chronic low back pain.  Has not been able to go to the gym due to the Covid pandemic.  She worries because of her high risk factors and her husband's immunosuppressed  status.  Assessment  1. Status post thyroidectomy   2. Adjustment insomnia   3. Chronic low back pain with sciatica, sciatica laterality unspecified, unspecified back pain laterality   4. Stress reaction   5. Perimenopausal symptom   6. Morbid obesity ([place])   7. PCOS (polycystic ovarian syndrome)      Plan   Status post thyroidectomy and parathyroidectomy: Follow-up with endocrine for calcium monitoring.  Surgical recovery is going well.  Adjustment insomnia and stress reaction: Counseling done.  Continue trazodone and titrate up if needed.  However will also start SSRI to manage anxiety symptoms.  Recheck 6 weeks.  Follow-up with GYN for hormonal evaluation.  Hopefully, managing stress will also help decrease stress eating and help her manage her weight.  Follow up: Return in about 6 weeks (around 08/16/2020) for mood follow up.  Visit date not found  No orders of the defined types were placed in this encounter.  Meds ordered this encounter  Medications   escitalopram (LEXAPRO) 10 MG tablet    Sig: Take 1 tablet (10 mg total) by mouth daily.    Dispense:  30 tablet    Refill:  2      I reviewed the patients updated PMH, FH, and SocHx.    Patient Active Problem List   Diagnosis Date Noted   Major depression, recurrent, chronic ([place]) 11/17/2018    Priority: High   Multiple thyroid nodules 08/19/2018    Priority: High  History of laparoscopic adjustable gastric banding 05/08/2012    Priority: High   Mixed hyperlipidemia 04/15/2008    Priority: High   Morbid obesity ([place]) 04/15/2008    Priority: High   Ventricular bigeminy 11/17/2018    Priority: Medium   Atopic dermatitis 08/19/2018    Priority: Medium   Psoriasis 01/05/2018    Priority: Medium   IUD (intrauterine device) in place, Liletta 12/07/2017    Priority: Medium   PCOS (polycystic ovarian syndrome) 10/25/2014    Priority: Medium   Migraine with aura 01/11/2014    Priority: Medium    Vitamin D deficiency 01/05/2018    Priority: Low   Allergic rhinitis 04/15/2008    Priority: Low   Allergic asthma 04/15/2008    Priority: Low   Hyperparathyroidism, primary ([place]) 05/22/2020   Elevated coronary artery calcium score, 90th percentile for age and gender 08/16/2018   Chronic low back pain with sciatica 01/05/2018   Current Meds  Medication Sig   albuterol (PROVENTIL HFA) 108 (90 Base) MCG/ACT inhaler Inhale 2 puffs into the lungs every 6 (six) hours as needed for wheezing or shortness of breath.   albuterol (PROVENTIL) (2.5 MG/3ML) 0.083% nebulizer solution Take 3 mLs (2.5 mg total) by nebulization every 6 (six) hours as needed for wheezing or shortness of breath.   cetirizine (ZYRTEC) 10 MG tablet Take 10 mg by mouth daily as needed for allergies.    diclofenac sodium (VOLTAREN) 1 % GEL Apply topically to affected area qid (Patient taking differently: Apply 1 application topically 4 (four) times daily as needed (pain). )   ferrous sulfate 325 (65 FE) MG EC tablet Take 325 mg by mouth daily with breakfast.   levothyroxine (SYNTHROID) 100 MCG tablet Take 2 tablets (200 mcg total) by mouth daily.   magnesium 30 MG tablet Take 30 mg by mouth daily.   metoprolol tartrate (LOPRESSOR) 25 MG tablet TAKE 1/2 TABLET BY MOUTH 2 TIMES DAILY or as directed. (Patient taking differently: Take 12.5 mg by mouth 2 (two) times daily. TAKE 1/2 TABLET BY MOUTH 2 TIMES DAILY or as directed.)   Multiple Vitamin (MULITIVITAMIN WITH MINERALS) TABS Take 1 tablet by mouth daily.   Omega-3 Fatty Acids (FISH OIL) 1000 MG CAPS Take 1,000 mg by mouth daily.   traZODone (DESYREL) 50 MG tablet Take 0.5-1 tablets (25-50 mg total) by mouth at bedtime as needed for sleep.   Turmeric (QC TUMERIC COMPLEX PO) Take 1 capsule by mouth daily.    Vitamin D, Cholecalciferol, 50 MCG (2000 UT) CAPS Take 2,000 Units by mouth daily.     Allergies: Patient is allergic to cephalosporins, eggs or  egg-derived products, penicillins, sulfa drugs cross reactors, adhesive [tape], and effexor [venlafaxine]. Family History: Patient family history includes Colon cancer in her mother; Colon polyps in her mother; Diabetes in her mother; Hypertension in her father; Obesity in her father, mother, and sister. Social History:  Patient  reports that she has never smoked. She has never used smokeless tobacco. She reports that she does not drink alcohol and does not use drugs.  Review of Systems: Constitutional: Negative for fever malaise or anorexia Cardiovascular: negative for chest pain Respiratory: negative for SOB or persistent cough Gastrointestinal: negative for abdominal pain  Objective  Vitals: BP 128/80    Pulse 84    Temp 98 F (36.7 C) (Temporal)    Ht 5\' 8"  (1.727 m)    Wt (!) 341 lb 3.2 oz (154.8 kg)    SpO2 97%  BMI 51.88 kg/m  General: no acute distress , A&Ox3, hoarse voice Psych: Normal affect, slightly anxious, good insight     Commons side effects, risks, benefits, and alternatives for medications and treatment plan prescribed today were discussed, and the patient expressed understanding of the given instructions. Patient is instructed to call or message via MyChart if he/she has any questions or concerns regarding our treatment plan. No barriers to understanding were identified. We discussed Red Flag symptoms and signs in detail. Patient expressed understanding regarding what to do in case of urgent or emergency type symptoms.   Medication list was reconciled, printed and provided to the patient in AVS. Patient instructions and summary information was reviewed with the patient as documented in the AVS. This note was prepared with assistance of Dragon voice recognition software. Occasional wrong-word or sound-a-like substitutions may have occurred due to the inherent limitations of voice recognition software  This visit occurred during the SARS-CoV-2 public health emergency.   Safety protocols were in place, including screening questions prior to the visit, additional usage of staff PPE, and extensive cleaning of exam room while observing appropriate contact time as indicated for disinfecting solutions.

## 2020-07-17 ENCOUNTER — Other Ambulatory Visit (HOSPITAL_COMMUNITY): Payer: Self-pay | Admitting: Internal Medicine

## 2020-07-17 MED FILL — SYNTHROID 175 MCG TABLET: 175 | 30 days supply | Qty: 30 | Fill #0

## 2020-07-18 MED FILL — FLUCELVAX QUADRIVALENT 0.5: 0.5 | 1 days supply | Qty: 1 | Fill #0

## 2020-07-28 MED FILL — TRIAMCINOLONE 0.1% CREAM: 0.1 | 30 days supply | Qty: 454 | Fill #1

## 2020-08-06 ENCOUNTER — Encounter: Payer: Self-pay | Admitting: Family Medicine

## 2020-08-07 ENCOUNTER — Other Ambulatory Visit (HOSPITAL_COMMUNITY): Payer: Self-pay | Admitting: Family Medicine

## 2020-08-07 MED ORDER — ALPRAZOLAM 0.5 MG PO TABS
0.5000 mg | ORAL_TABLET | Freq: Two times a day (BID) | ORAL | 0 refills | Status: DC | PRN
Start: 2020-08-07 — End: 2024-02-04

## 2020-08-07 MED FILL — ALPRAZolam 0.5 MG TABS: 0.5 | 15 days supply | Qty: 30 | Fill #0

## 2020-08-14 MED FILL — SYNTHROID 175 MCG TABLET: 175 | 30 days supply | Qty: 30 | Fill #1

## 2020-08-14 MED FILL — traZODone HCL 50 MG TABS: 50 | 90 days supply | Qty: 90 | Fill #1

## 2020-08-18 ENCOUNTER — Other Ambulatory Visit: Payer: Self-pay

## 2020-08-18 ENCOUNTER — Encounter: Payer: Self-pay | Admitting: Family Medicine

## 2020-08-18 ENCOUNTER — Ambulatory Visit (INDEPENDENT_AMBULATORY_CARE_PROVIDER_SITE_OTHER): Payer: No Typology Code available for payment source | Admitting: Family Medicine

## 2020-08-18 VITALS — BP 151/80 | HR 83 | Temp 98.2°F | Ht 68.0 in | Wt 340.2 lb

## 2020-08-18 DIAGNOSIS — F430 Acute stress reaction: Secondary | ICD-10-CM | POA: Diagnosis not present

## 2020-08-18 DIAGNOSIS — F5102 Adjustment insomnia: Secondary | ICD-10-CM

## 2020-08-18 NOTE — Progress Notes (Signed)
Subjective \  CC:  Chief Complaint  Patient presents with   Anxiety    Pt says that she took the Xanax and it helped. She also says that she took both the Lexapro, and Synthroid  and had a headache as a result, so she doesnt know  when medication caused it. she will take a nother trial of lexapro.    HPI: Olivia [name] is a 54 y.o. female who presents to the office today to address the problems listed above in the chief complaint, mood problems.  This was supposed to be depression anxiety follow-up, 6-week.  We were starting Lexapro.  However, patient's father had emergent bypass surgery and a difficult complicated postop course.  He was recently discharged.  Olivia [name] has breast been very stressed.  She has used a few Xanax to help her manage.  This helped her anxiety and some sleep.  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  She is very very anxious and continues to be depressed.  Her symptoms are not worse however.  She has no suicidality.  She reports she took Lexapro and had headache and nausea.  She also started new thyroid medication.  She is not sure she can tolerate the Lexapro.  S/p total thyroidectomy on supplements now.  She has been released from surgery.  Lab Results  Component Value Date   TSH 2.38 05/01/2020    Depression screen [facility] 2/9 05/01/2020 07/15/2019 11/17/2018  Decreased Interest 3 0 0  Down, Depressed, Hopeless 3 0 0  PHQ - 2 Score 6 0 0  Altered sleeping 3 3 2   Tired, decreased energy 3 3 2   Change in appetite 3 0 1  Feeling bad or failure about yourself  3 2 1   Trouble concentrating 3 3 1   Moving slowly or fidgety/restless 3 1 0  Suicidal thoughts 0 0 0  PHQ-9 Score 24 12 7   Difficult doing work/chores Somewhat difficult Somewhat difficult Somewhat difficult    Assessment  1. Stress reaction   2. Adjustment insomnia      Plan   Stress anxiety: She  will try Lexapro again.  If after 1 or 2 weeks nausea or headaches persist, we will change to Zoloft.  Continue Xanax as needed.  Counseling done.  Recheck 6 to 8 weeks  Follow up: 6 to 8 weeks No orders of the defined types were placed in this encounter.  No orders of the defined types were placed in this encounter.     I reviewed the patients updated PMH, FH, and SocHx.    Patient Active Problem List   Diagnosis Date Noted   Major depression, recurrent, chronic ([place]) 11/17/2018    Priority: High   Multiple thyroid nodules 08/19/2018    Priority: High   History of laparoscopic adjustable gastric banding 05/08/2012    Priority: High   Mixed hyperlipidemia 04/15/2008    Priority: High   Morbid obesity ([place]) 04/15/2008    Priority: High   Ventricular bigeminy 11/17/2018    Priority: Medium   Atopic dermatitis 08/19/2018    Priority: Medium   Psoriasis 01/05/2018    Priority: Medium   IUD (intrauterine device) in place, Liletta 12/07/2017    Priority: Medium   PCOS (polycystic ovarian syndrome) 10/25/2014    Priority: Medium   Migraine with aura 01/11/2014    Priority: Medium   Vitamin D deficiency 01/05/2018  Priority: Low   Allergic rhinitis 04/15/2008    Priority: Low   Allergic asthma 04/15/2008    Priority: Low   Hyperparathyroidism, primary ([facility]) 05/22/2020   Elevated coronary artery calcium score, 90th percentile for age and gender 08/16/2018   Chronic low back pain with sciatica 01/05/2018   Current Meds  Medication Sig   albuterol (PROVENTIL HFA) 108 (90 Base) MCG/ACT inhaler Inhale 2 puffs into the lungs every 6 (six) hours as needed for wheezing or shortness of breath.   albuterol (PROVENTIL) (2.5 MG/3ML) 0.083% nebulizer solution Take 3 mLs (2.5 mg total) by nebulization every 6 (six) hours as needed for wheezing or shortness of breath.   ALPRAZolam (XANAX) 0.5 MG tablet Take 1 tablet (0.5 mg total) by mouth 2 (two) times daily as needed  for anxiety.   cetirizine (ZYRTEC) 10 MG tablet Take 10 mg by mouth daily as needed for allergies.    diclofenac sodium (VOLTAREN) 1 % GEL Apply topically to affected area qid (Patient taking differently: Apply 1 application topically 4 (four) times daily as needed (pain). )   ferrous sulfate 325 (65 FE) MG EC tablet Take 325 mg by mouth daily with breakfast.   levothyroxine (SYNTHROID) 100 MCG tablet Take 2 tablets (200 mcg total) by mouth daily.   magnesium 30 MG tablet Take 30 mg by mouth daily.   metoprolol tartrate (LOPRESSOR) 25 MG tablet TAKE 1/2 TABLET BY MOUTH 2 TIMES DAILY or as directed. (Patient taking differently: Take 12.5 mg by mouth 2 (two) times daily. TAKE 1/2 TABLET BY MOUTH 2 TIMES DAILY or as directed.)   Multiple Vitamin (MULITIVITAMIN WITH MINERALS) TABS Take 1 tablet by mouth daily.   Omega-3 Fatty Acids (FISH OIL) 1000 MG CAPS Take 1,000 mg by mouth daily.   traZODone (DESYREL) 50 MG tablet Take 0.5-1 tablets (25-50 mg total) by mouth at bedtime as needed for sleep.   Turmeric (QC TUMERIC COMPLEX PO) Take 1 capsule by mouth daily.    Vitamin D, Cholecalciferol, 50 MCG (2000 UT) CAPS Take 2,000 Units by mouth daily.     Allergies: Patient is allergic to cephalosporins, eggs or egg-derived products, penicillins, sulfa drugs cross reactors, adhesive [tape], and effexor [venlafaxine]. Family history:  Patient family history includes Colon cancer in her mother; Colon polyps in her mother; Diabetes in her mother; Hypertension in her father; Obesity in her father, mother, and sister. Social History   Socioeconomic History   Marital status: Married    Spouse name: Not on file   Number of children: Not on file   Years of education: Not on file   Highest education level: Not on file  Occupational History   Occupation: NP    Employer: [employer]  Tobacco Use   Smoking status: Never Smoker   Smokeless tobacco: Never Used  Scientific laboratory technician Use:  Never used  Substance and Sexual Activity   Alcohol use: No   Drug use: No   Sexual activity: Yes    Partners: Male    Birth control/protection: I.U.D.    Comment: Vasectomy in spouse  Other Topics Concern   Not on file  Social History Narrative   Not on file   Social Determinants of Health   Financial Resource Strain:    Difficulty of Paying Living Expenses: Not on file  Food Insecurity:    Worried About Charity fundraiser in the Last Year: Not on file   [employer] of Food in the Last Year: Not  on file  Transportation Needs:    Lack of Transportation (Medical): Not on file   Lack of Transportation (Non-Medical): Not on file  Physical Activity:    Days of Exercise per Week: Not on file   Minutes of Exercise per Session: Not on file  Stress:    Feeling of Stress : Not on file  Social Connections:    Frequency of Communication with Friends and Family: Not on file   Frequency of Social Gatherings with Friends and Family: Not on file   Attends Religious Services: Not on file   Active Member of [place] or Organizations: Not on file   Attends Archivist Meetings: Not on file   Marital Status: Not on file     Review of Systems: Constitutional: Negative for fever malaise or anorexia Cardiovascular: negative for chest pain Respiratory: negative for SOB or persistent cough Gastrointestinal: negative for abdominal pain  Objective  Vitals: BP (!) 151/80    Pulse 83    Temp 98.2 F (36.8 C) (Temporal)    Ht 5\' 8"  (1.727 m)    Wt (!) 340 lb 3.2 oz (154.3 kg)    SpO2 96%    BMI 51.73 kg/m  General: no acute distress, well appearing, no apparent distress, well groomed     Commons side effects, risks, benefits, and alternatives for medications and treatment plan prescribed today were discussed, and the patient expressed understanding of the given instructions. Patient is instructed to call or message via MyChart if he/she has any questions or concerns  regarding our treatment plan. No barriers to understanding were identified. We discussed Red Flag symptoms and signs in detail. Patient expressed understanding regarding what to do in case of urgent or emergency type symptoms.   Medication list was reconciled, printed and provided to the patient in AVS. Patient instructions and summary information was reviewed with the patient as documented in the AVS. This note was prepared with assistance of Dragon voice recognition software. Occasional wrong-word or sound-a-like substitutions may have occurred due to the inherent limitations of voice recognition software

## 2020-09-12 MED FILL — SYNTHROID 175 MCG TABLET: 175 | 30 days supply | Qty: 30 | Fill #2

## 2020-09-26 MED FILL — CLINDAMYCIN HCL 150 MG CAPS: 150 | 10 days supply | Qty: 30 | Fill #1

## 2020-09-27 ENCOUNTER — Other Ambulatory Visit (HOSPITAL_COMMUNITY): Payer: Self-pay | Admitting: Internal Medicine

## 2020-09-27 MED FILL — SYNTHROID 150 MCG TABLET: 150 | 90 days supply | Qty: 90 | Fill #0

## 2020-09-27 MED FILL — PREMARIN VAGINAL CREAM-APPL: 0.625 | 90 days supply | Qty: 30 | Fill #0

## 2020-10-12 ENCOUNTER — Ambulatory Visit: Payer: No Typology Code available for payment source | Admitting: Family Medicine

## 2020-12-27 ENCOUNTER — Other Ambulatory Visit (HOSPITAL_COMMUNITY): Payer: Self-pay | Admitting: Internal Medicine

## 2020-12-27 MED FILL — SYNTHROID 137 MCG TABLET: 137 | 90 days supply | Qty: 90 | Fill #0

## 2021-01-17 ENCOUNTER — Encounter: Payer: Self-pay | Admitting: Family Medicine

## 2021-01-17 ENCOUNTER — Other Ambulatory Visit: Payer: Self-pay

## 2021-01-17 ENCOUNTER — Other Ambulatory Visit (HOSPITAL_COMMUNITY): Payer: Self-pay | Admitting: Family Medicine

## 2021-01-17 ENCOUNTER — Ambulatory Visit (INDEPENDENT_AMBULATORY_CARE_PROVIDER_SITE_OTHER): Payer: No Typology Code available for payment source | Admitting: Family Medicine

## 2021-01-17 VITALS — BP 124/88 | HR 85 | Temp 98.0°F | Resp 16 | Ht 68.0 in | Wt 349.6 lb

## 2021-01-17 DIAGNOSIS — E282 Polycystic ovarian syndrome: Secondary | ICD-10-CM

## 2021-01-17 DIAGNOSIS — E210 Primary hyperparathyroidism: Secondary | ICD-10-CM

## 2021-01-17 DIAGNOSIS — E890 Postprocedural hypothyroidism: Secondary | ICD-10-CM

## 2021-01-17 DIAGNOSIS — E782 Mixed hyperlipidemia: Secondary | ICD-10-CM

## 2021-01-17 DIAGNOSIS — M9261 Juvenile osteochondrosis of tarsus, right ankle: Secondary | ICD-10-CM | POA: Diagnosis not present

## 2021-01-17 DIAGNOSIS — Z9889 Other specified postprocedural states: Secondary | ICD-10-CM | POA: Insufficient documentation

## 2021-01-17 DIAGNOSIS — F339 Major depressive disorder, recurrent, unspecified: Secondary | ICD-10-CM

## 2021-01-17 HISTORY — DX: Other specified postprocedural states: Z98.890

## 2021-01-17 LAB — COMPREHENSIVE METABOLIC PANEL
ALT: 14 U/L (ref 0–35)
AST: 18 U/L (ref 0–37)
Albumin: 4.2 g/dL (ref 3.5–5.2)
Alkaline Phosphatase: 52 U/L (ref 39–117)
BUN: 14 mg/dL (ref 6–23)
CO2: 27 mEq/L (ref 19–32)
Calcium: 9.9 mg/dL (ref 8.4–10.5)
Chloride: 104 mEq/L (ref 96–112)
Creatinine, Ser: 0.79 mg/dL (ref 0.40–1.20)
GFR: 85.73 mL/min (ref >60.00)
Glucose, Bld: 102 mg/dL — ABNORMAL HIGH (ref 70–99)
Potassium: 4.1 mEq/L (ref 3.5–5.1)
Sodium: 139 mEq/L (ref 135–145)
Total Bilirubin: 1 mg/dL (ref 0.2–1.2)
Total Protein: 7 g/dL (ref 6.0–8.3)

## 2021-01-17 LAB — LIPID PANEL
Cholesterol: 225 mg/dL — ABNORMAL HIGH (ref 0–200)
HDL: 62.3 mg/dL (ref >39.00)
LDL Cholesterol: 143 mg/dL — ABNORMAL HIGH (ref 0–99)
NonHDL: 162.62
Total CHOL/HDL Ratio: 4
Triglycerides: 99 mg/dL (ref 0.0–149.0)
VLDL: 19.8 mg/dL (ref 0.0–40.0)

## 2021-01-17 LAB — CBC WITH DIFFERENTIAL/PLATELET
Basophils Absolute: 0.1 10*3/uL (ref 0.0–0.1)
Basophils Relative: 1.2 % (ref 0.0–3.0)
Eosinophils Absolute: 0.3 10*3/uL (ref 0.0–0.7)
Eosinophils Relative: 4.2 % (ref 0.0–5.0)
HCT: 46.1 % — ABNORMAL HIGH (ref 36.0–46.0)
Hemoglobin: 15.7 g/dL — ABNORMAL HIGH (ref 12.0–15.0)
Lymphocytes Relative: 37.2 % (ref 12.0–46.0)
Lymphs Abs: 2.3 10*3/uL (ref 0.7–4.0)
MCHC: 34 g/dL (ref 30.0–36.0)
MCV: 82.2 fl (ref 78.0–100.0)
Monocytes Absolute: 0.6 10*3/uL (ref 0.1–1.0)
Monocytes Relative: 8.8 % (ref 3.0–12.0)
Neutro Abs: 3.1 10*3/uL (ref 1.4–7.7)
Neutrophils Relative %: 48.6 % (ref 43.0–77.0)
Platelets: 233 10*3/uL (ref 150.0–400.0)
RBC: 5.61 Mil/uL — ABNORMAL HIGH (ref 3.87–5.11)
RDW: 13.4 % (ref 11.5–15.5)
WBC: 6.3 10*3/uL (ref 4.0–10.5)

## 2021-01-17 LAB — HEMOGLOBIN A1C: Hgb A1c MFr Bld: 5.6 % (ref 4.6–6.5)

## 2021-01-17 LAB — TSH: TSH: 0.06 u[IU]/mL — ABNORMAL LOW (ref 0.35–4.50)

## 2021-01-17 MED ORDER — MUPIROCIN CALCIUM 2 % EX CREA: 1.0000 "application " | TOPICAL_CREAM | Freq: Two times a day (BID) | CUTANEOUS | 0 refills | Status: DC

## 2021-01-17 MED ORDER — ROSUVASTATIN CALCIUM 10 MG PO TABS
10.0000 mg | ORAL_TABLET | Freq: Every day | ORAL | 3 refills | Status: DC
Start: 2021-01-17 — End: 2021-03-14

## 2021-01-17 MED ORDER — ESCITALOPRAM OXALATE 10 MG PO TABS: 10.0000 mg | ORAL_TABLET | Freq: Every day | ORAL | 2 refills | Status: DC

## 2021-01-17 NOTE — Progress Notes (Signed)
Subjective  CC:  Chief Complaint  Patient presents with  . Depression    Wanting to restart Lexapro  . Obesity    Believes this is related to parathyroid tumor and the removal of her thyroid and some parathyroid tissue.   . Foot Pain    Right heel, interfering with walking and exercise     HPI: Olivia [name] is a 55 y.o. female who presents to the office today to address the problems listed above in the chief complaint.  55 year old female with history of depression and anxiety who was doing  very poorly when she was last here in October due to situational stressorsIs here for follow-up.  She never started the Lexapro at that time.  Fortunately her father's health has improved, however she remains depressed.  Worse than before.  Admits to anhedonia, low motivation, very poor sleep, and struggles to keep focus due to heavy work schedule and home responsibilities.  She denies panic attacks.  She is ready to start antidepressants.  We chose Lexapro in the past.  She thinks this would still be a good choice.  No suicidal ideation.  Postsurgical hypothyroidism: Medications are being adjusted by endocrinology.  We will request records.  She was at 200 mcdaily, now down to 137 mcg daily.  Her weight is fluctuating, continues to gain in spite of decreased intake.  Unable to exercise.  Believes hypothyroidism could be affecting her mood, weight gain and emotional lability.  History of hyperparathyroidism status post partial parathyroid ectomy: History of parathyroid adenoma.  Would like her calcium levels rechecked.  PCOS: Perimenopausal.  Also affecting sleep.  History of impaired fasting glucose.  Denies symptoms of hypoglycemia.  Hyperlipidemia: Was on Crestor 10 mg nightly.  Was unable to get refill from cardiology.  Indication was elevated calcium score with family history.  She would be willing to restart.  She is nonfasting today.  Right heel pain: Posterior.  Continues to progress.   Now still painful hurts with walking.  Unable to exercise.  Has tried conservative therapies including ice, stretching, NSAIDs, topical Voltaren gel and heel cups.  None of these therapies have been helpful.  No trauma.  Wt Readings from Last 3 Encounters:  01/17/21 (!) 349 lb 9.6 oz (158.6 kg)  08/18/20 (!) 340 lb 3.2 oz (154.3 kg)  07/05/20 (!) 341 lb 3.2 oz (154.8 kg)   BP Readings from Last 3 Encounters:  01/17/21 124/88  08/18/20 (!) 151/80  07/05/20 128/80    Assessment  1. Major depression, recurrent, chronic ([place])   2. Morbid obesity ([place])   3. History of total thyroidectomy   4. History of parathyroid surgery 2021   5. PCOS (polycystic ovarian syndrome)   6. Hyperparathyroidism, primary ([place])   7. Mixed hyperlipidemia   8. Haglund's deformity of right heel   9. Post-surgical hypothyroidism      Plan   Major depression, recurrent, moderate exacerbation: Counseling done.  Start Lexapro.  Appropriate use expectations discussed in detail.  Try to curb workload if possible.  Close follow-up.  Recheck 6 weeks.  Hypothyroidism: Managed by endocrinology.  Reassured.  Weight fluctuations could be related to thyroid.  History of primary hypothyroidism: We will recheck levels today.  PCOS: Again discussed how this makes weight loss more difficult.  Will monitor glucose metabolism.  Check A1c today.  Had been on Metformin in the past.  Likely perimenopausal, and affect weight loss.  Mixed hyperlipidemia: We will restart Crestor.  Check lipids today.  Check LFTs today.  Emphasized importance of taking his medication.  Patient agrees.  Heel pain: Failed conservative treatment.  Refer to sports medicine for further evaluation and treatment options.  Today's visit was 40 minutes long. Greater than 50% of this time was devoted to face to face counseling with the patient and coordination of care. We discussed her diagnosis, prognosis, treatment options and treatment plan is documented  above.   Follow up: 6 weeks to recheck mood Visit date not found  Orders Placed This Encounter  Procedures  . CBC with Differential/Platelet  . Comprehensive metabolic panel  . Lipid panel  . TSH  . PTH, Intact and Calcium  . Hemoglobin A1c  . Ambulatory referral to Sports Medicine   Meds ordered this encounter  Medications  . escitalopram (LEXAPRO) 10 MG tablet    Sig: Take 1 tablet (10 mg total) by mouth daily.    Dispense:  30 tablet    Refill:  2  . rosuvastatin (CRESTOR) 10 MG tablet    Sig: Take 1 tablet (10 mg total) by mouth daily.    Dispense:  90 tablet    Refill:  3  . mupirocin cream (BACTROBAN) 2 %    Sig: Apply 1 application topically 2 (two) times daily.    Dispense:  15 g    Refill:  0      I reviewed the patients updated PMH, FH, and SocHx.    Patient Active Problem List   Diagnosis Date Noted  . Major depression, recurrent, chronic ([place]) 11/17/2018    Priority: High  . History of laparoscopic adjustable gastric banding 05/08/2012    Priority: High  . Mixed hyperlipidemia 04/15/2008    Priority: High  . Morbid obesity ([place]) 04/15/2008    Priority: High  . Ventricular bigeminy 11/17/2018    Priority: Medium  . Atopic dermatitis 08/19/2018    Priority: Medium  . Elevated coronary artery calcium score, 90th percentile for age and gender 08/16/2018    Priority: Medium  . Psoriasis 01/05/2018    Priority: Medium  . IUD (intrauterine device) in place, Liletta 12/07/2017    Priority: Medium  . PCOS (polycystic ovarian syndrome) 10/25/2014    Priority: Medium  . Migraine with aura 01/11/2014    Priority: Medium  . Vitamin D deficiency 01/05/2018    Priority: Low  . Allergic rhinitis 04/15/2008    Priority: Low  . Allergic asthma 04/15/2008    Priority: Low  . History of total thyroidectomy 01/17/2021  . History of parathyroid surgery 2021 01/17/2021  . Post-surgical hypothyroidism 01/17/2021  . Hyperparathyroidism, primary ([place]) 05/22/2020   . Chronic low back pain with sciatica 01/05/2018   Current Meds  Medication Sig  . albuterol (PROVENTIL HFA) 108 (90 Base) MCG/ACT inhaler Inhale 2 puffs into the lungs every 6 (six) hours as needed for wheezing or shortness of breath.  Marland Kitchen albuterol (PROVENTIL) (2.5 MG/3ML) 0.083% nebulizer solution Take 3 mLs (2.5 mg total) by nebulization every 6 (six) hours as needed for wheezing or shortness of breath.  . ALPRAZolam (XANAX) 0.5 MG tablet Take 1 tablet (0.5 mg total) by mouth 2 (two) times daily as needed for anxiety.  . cetirizine (ZYRTEC) 10 MG tablet Take 10 mg by mouth daily as needed for allergies.   Marland Kitchen diclofenac sodium (VOLTAREN) 1 % GEL Apply topically to affected area qid (Patient taking differently: Apply 1 application topically 4 (four) times daily as needed (pain).)  . famotidine (PEPCID) 20 MG tablet Take 20 mg  by mouth daily as needed for heartburn or indigestion.  . ferrous sulfate 325 (65 FE) MG EC tablet Take 325 mg by mouth daily with breakfast.  . magnesium 30 MG tablet Take 30 mg by mouth daily.  . Multiple Vitamin (MULITIVITAMIN WITH MINERALS) TABS Take 1 tablet by mouth daily.  . mupirocin cream (BACTROBAN) 2 % Apply 1 application topically 2 (two) times daily.  . Omega-3 Fatty Acids (FISH OIL) 1000 MG CAPS Take 1,000 mg by mouth daily.  . rosuvastatin (CRESTOR) 10 MG tablet Take 1 tablet (10 mg total) by mouth daily.  . Turmeric (QC TUMERIC COMPLEX PO) Take 1 capsule by mouth daily.   . Vitamin D, Cholecalciferol, 50 MCG (2000 UT) CAPS Take 2,000 Units by mouth daily.   . [DISCONTINUED] levothyroxine (SYNTHROID) 100 MCG tablet Take 2 tablets (200 mcg total) by mouth daily. (Patient taking differently: Take 137 mcg by mouth daily.)  . [DISCONTINUED] metoprolol tartrate (LOPRESSOR) 25 MG tablet TAKE 1/2 TABLET BY MOUTH 2 TIMES DAILY or as directed. (Patient taking differently: Take 12.5 mg by mouth 2 (two) times daily. TAKE 1/2 TABLET BY MOUTH 2 TIMES DAILY or as directed.)     Allergies: Patient is allergic to cephalosporins, eggs or egg-derived products, penicillins, sulfa drugs cross reactors, adhesive [tape], and effexor [venlafaxine]. Family History: Patient family history includes Colon cancer in her mother; Colon polyps in her mother; Diabetes in her mother; Hypertension in her father; Obesity in her father, mother, and sister. Social History:  Patient  reports that she has never smoked. She has never used smokeless tobacco. She reports that she does not drink alcohol and does not use drugs.  Review of Systems: Constitutional: Negative for fever malaise or anorexia Cardiovascular: negative for chest pain Respiratory: negative for SOB or persistent cough Gastrointestinal: negative for abdominal pain  Objective  Vitals: BP 124/88   Pulse 85   Temp 98 F (36.7 C) (Temporal)   Resp 16   Ht 5\' 8"  (1.727 m)   Wt (!) 349 lb 9.6 oz (158.6 kg)   SpO2 96%   BMI 53.16 kg/m  General: no acute distress , A&Ox3 Psych: Normal affect, good insight, tearful at times, normal speech HEENT: [place]ER[place], conjunctiva normal, neck is supple Cardiovascular:  RRR without murmur or gallop.  Respiratory:  Good breath sounds bilaterally, CTAB with normal respiratory effort Skin:  Warm, no rashes, small skin infection right wrist Posterior right ankle tender nodule present     Commons side effects, risks, benefits, and alternatives for medications and treatment plan prescribed today were discussed, and the patient expressed understanding of the given instructions. Patient is instructed to call or message via MyChart if he/she has any questions or concerns regarding our treatment plan. No barriers to understanding were identified. We discussed Red Flag symptoms and signs in detail. Patient expressed understanding regarding what to do in case of urgent or emergency type symptoms.   Medication list was reconciled, printed and provided to the patient in AVS. Patient instructions  and summary information was reviewed with the patient as documented in the AVS. This note was prepared with assistance of Dragon voice recognition software. Occasional wrong-word or sound-a-like substitutions may have occurred due to the inherent limitations of voice recognition software  This visit occurred during the SARS-CoV-2 public health emergency.  Safety protocols were in place, including screening questions prior to the visit, additional usage of staff PPE, and extensive cleaning of exam room while observing appropriate contact time as indicated for disinfecting  solutions.

## 2021-01-17 NOTE — Patient Instructions (Signed)
Please return in 6 weeks to recheck mood. Please schedule a complete physical for June or July  I will release your lab results to you on your MyChart account with further instructions. Please reply with any questions.   We will call you to get you in with Sports Medicine.   If you have any questions or concerns, please don't hesitate to send me a message via MyChart or call the office at [phone]. Thank you for visiting with [place] today! It's our pleasure caring for you.

## 2021-01-18 LAB — PTH, INTACT AND CALCIUM
Calcium: 9.8 mg/dL (ref 8.6–10.4)
PTH: 28 pg/mL (ref 16–77)

## 2021-01-22 NOTE — Progress Notes (Deleted)
   Subjective:    I'm seeing this patient as a consultation for:  Dr. Jonni Sanger. Note will be routed back to referring provider/PCP.  CC: R posterior heel pain  I, Edda Orea, LAT, ATC, am serving as scribe for Dr. Lynne Leader.  HPI: Pt is a 55 y/o female presenting w/ c/o progressively worsening R post heel pain since .  Swelling: Aggravating factors: walking Treatments tried: Ice, Voltaren gel, stretching, NSAIDs, heel cups  Past medical history, Surgical history, Family history, Social history, Allergies, and medications have been entered into the medical record, reviewed. ***  Review of Systems: No new headache, visual changes, nausea, vomiting, diarrhea, constipation, dizziness, abdominal pain, skin rash, fevers, chills, night sweats, weight loss, swollen lymph nodes, body aches, joint swelling, muscle aches, chest pain, shortness of breath, mood changes, visual or auditory hallucinations.   Objective:   There were no vitals filed for this visit. General: Well Developed, well nourished, and in no acute distress.  Neuro/Psych: Alert and oriented x3, extra-ocular muscles intact, able to move all 4 extremities, sensation grossly intact. Skin: Warm and dry, no rashes noted.  Respiratory: Not using accessory muscles, speaking in full sentences, trachea midline.  Cardiovascular: Pulses palpable, no extremity edema. Abdomen: Does not appear distended. MSK: ***  Lab and Radiology Results No results found for this or any previous visit (from the past 72 hour(s)). No results found.  Impression and Recommendations:    Assessment and Plan: 55 y.o. female with ***.  PDMP not reviewed this encounter. No orders of the defined types were placed in this encounter.  No orders of the defined types were placed in this encounter.   Discussed warning signs or symptoms. Please see discharge instructions. Patient expresses understanding.   ***

## 2021-01-23 ENCOUNTER — Ambulatory Visit: Payer: No Typology Code available for payment source | Admitting: Family Medicine

## 2021-02-02 NOTE — Progress Notes (Signed)
Subjective:    I'm seeing this patient as a consultation for:  Dr. Jonni Sanger. Note will be routed back to referring provider/PCP.  CC: R heel pain / Haglund's deformity  I, Wendy Poet, LAT, ATC, am serving as scribe for Dr. Lynne Leader.  HPI: Pt is a 55 y/o female presenting w/ c/o R heel pain and pump bump x 5-6 months w/ no known MOI.  She states that she also has splinter hemorrhages on both of her great toenails and is curious if she may psoriatic arthritis.  She does have psoriasis that is not ideally controlled which is also a concern for her as a potential cause of psoriatic arthritis.  She also notes that she has gained quite a bit of weight in the last year following surgery for parathyroid tumor.  Radiating pain: yes into the R heel and occasionally into her R Achille's  Swelling: yes Aggravating factors: walking; pressure to the area Treatments tried: ice, stretching, NSAIDs, topical Voltaren, heel cup  Past medical history, Surgical history, Family history, Social history, Allergies, and medications have been entered into the medical record, reviewed.   Review of Systems: No new headache, visual changes, nausea, vomiting, diarrhea, constipation, dizziness, abdominal pain, skin rash, fevers, chills, night sweats, weight loss, swollen lymph nodes, body aches, joint swelling, muscle aches, chest pain, shortness of breath, mood changes, visual or auditory hallucinations.   Objective:    Vitals:   02/05/21 0941  BP: 124/86  Pulse: 76  SpO2: 97%   General: Well Developed, well nourished, and in no acute distress.  Neuro/Psych: Alert and oriented x3, extra-ocular muscles intact, able to move all 4 extremities, sensation grossly intact. Skin: Warm and dry, no rashes noted.  Respiratory: Not using accessory muscles, speaking in full sentences, trachea midline.  Cardiovascular: Pulses palpable, no extremity edema. Abdomen: Does not appear distended. MSK:  Right foot: Slight  swelling posterior calcaneus. Tiny hematoma/splinter hemorrhage right great toenail. Normal foot and ankle motion. Tender palpation posterior calcaneus. Intact strength.  Nail exam hands and feet bilaterally reveals some linear creases but no pitting.  Lab and Radiology Results   Diagnostic Limited MSK Ultrasound of: Right posterior calcaneus Intact Achilles tendon at insertion site. Calcific change present at insertion site consistent with chronic calcific tendinopathy/Haglund deformity. No retrocalcaneal bursitis present. No significant vascular activity present in or superficial to tendon. Normal bony structures otherwise. Impression: Chronic calcific tendinopathy/Haglund deformity  Recent Results (from the past 2160 hour(s))  CBC with Differential/Platelet     Status: Abnormal   Collection Time: 01/17/21  9:08 AM  Result Value Ref Range   WBC 6.3 4.0 - 10.5 K/uL   RBC 5.61 (H) 3.87 - 5.11 Mil/uL   Hemoglobin 15.7 (H) 12.0 - 15.0 g/dL   HCT 46.1 (H) 36.0 - 46.0 %   MCV 82.2 78.0 - 100.0 fl   MCHC 34.0 30.0 - 36.0 g/dL   RDW 13.4 11.5 - 15.5 %   Platelets 233.0 150.0 - 400.0 K/uL   Neutrophils Relative % 48.6 43.0 - 77.0 %   Lymphocytes Relative 37.2 12.0 - 46.0 %   Monocytes Relative 8.8 3.0 - 12.0 %   Eosinophils Relative 4.2 0.0 - 5.0 %   Basophils Relative 1.2 0.0 - 3.0 %   Neutro Abs 3.1 1.4 - 7.7 K/uL   Lymphs Abs 2.3 0.7 - 4.0 K/uL   Monocytes Absolute 0.6 0.1 - 1.0 K/uL   Eosinophils Absolute 0.3 0.0 - 0.7 K/uL   Basophils Absolute 0.1  0.0 - 0.1 K/uL  Comprehensive metabolic panel     Status: Abnormal   Collection Time: 01/17/21  9:08 AM  Result Value Ref Range   Sodium 139 135 - 145 mEq/L   Potassium 4.1 3.5 - 5.1 mEq/L   Chloride 104 96 - 112 mEq/L   CO2 27 19 - 32 mEq/L   Glucose, Bld 102 (H) 70 - 99 mg/dL   BUN 14 6 - 23 mg/dL   Creatinine, Ser 0.79 0.40 - 1.20 mg/dL   Total Bilirubin 1.0 0.2 - 1.2 mg/dL   Alkaline Phosphatase 52 39 - 117 U/L   AST 18  0 - 37 U/L   ALT 14 0 - 35 U/L   Total Protein 7.0 6.0 - 8.3 g/dL   Albumin 4.2 3.5 - 5.2 g/dL   GFR 85.73 >60.00 mL/min    Comment: Calculated using the CKD-EPI Creatinine Equation (2021)   Calcium 9.9 8.4 - 10.5 mg/dL  Lipid panel     Status: Abnormal   Collection Time: 01/17/21  9:08 AM  Result Value Ref Range   Cholesterol 225 (H) 0 - 200 mg/dL    Comment: ATP III Classification       Desirable:  < 200 mg/dL               Borderline High:  200 - 239 mg/dL          High:  > = 240 mg/dL   Triglycerides 99.0 0.0 - 149.0 mg/dL    Comment: Normal:  <150 mg/dLBorderline High:  150 - 199 mg/dL   HDL 62.30 >39.00 mg/dL   VLDL 19.8 0.0 - 40.0 mg/dL   LDL Cholesterol 143 (H) 0 - 99 mg/dL   Total CHOL/HDL Ratio 4     Comment:                Men          Women1/2 Average Risk     3.4          3.3Average Risk          5.0          4.42X Average Risk          9.6          7.13X Average Risk          15.0          11.0                       NonHDL 162.62     Comment: NOTE:  Non-HDL goal should be 30 mg/dL higher than patient's LDL goal (i.e. LDL goal of < 70 mg/dL, would have non-HDL goal of < 100 mg/dL)  TSH     Status: Abnormal   Collection Time: 01/17/21  9:08 AM  Result Value Ref Range   TSH 0.06 (L) 0.35 - 4.50 uIU/mL  PTH, Intact and Calcium     Status: None   Collection Time: 01/17/21  9:08 AM  Result Value Ref Range   PTH 28 16 - 77 pg/mL    Comment: . Interpretive Guide    Intact PTH           Calcium ------------------    ----------           ------- Normal Parathyroid    Normal               Normal Hypoparathyroidism    Low or Low Normal  Low Hyperparathyroidism    Primary            Normal or High       High    Secondary          High                 Normal or Low    Tertiary           High                 High Non-Parathyroid    Hypercalcemia      Low or Low Normal    High .    Calcium 9.8 8.6 - 10.4 mg/dL  Hemoglobin A1c     Status: None   Collection Time: 01/17/21   9:08 AM  Result Value Ref Range   Hgb A1c MFr Bld 5.6 4.6 - 6.5 %    Comment: Glycemic Control Guidelines for People with Diabetes:Non Diabetic:  <6%Goal of Therapy: <7%Additional Action Suggested:  >8%       Impression and Recommendations:    Assessment and Plan: 55 y.o. female with right heel pain thought to be related to chronic calcific insertional tendinopathy or Haglund deformity.  Plan to treat with eccentric exercises taught in clinic today by ATC.  If she is unable to do them body weight then we will use elastic bands.  Additionally recommend and will proceed with nitroglycerin patch protocol.  Recommend use of Cam walker boot temporarily as needed for discomfort.  Additionally patient is at risk for psoriatic arthritis.  We will go ahead and proceed with a limited rheumatologic work-up listed below.  This will supplement the labs that she just had done an mid March listed above.  Summary of labs show that they are mostly normal except for TSH which was suppressed.  As for the splinter hemorrhages this may be related to possible psoriatic arthritis or may not be.  If lab work-up is nondiagnostic she may benefit from a further cardiac work-up.  Recheck back in about a month.Marland Kitchen  PDMP not reviewed this encounter. Orders Placed This Encounter  Procedures  . [place] LIMITED JOINT SPACE STRUCTURES LOW RIGHT(NO LINKED CHARGES)    Order Specific Question:   Reason for Exam (SYMPTOM  OR DIAGNOSIS REQUIRED)    Answer:   rt foot    Order Specific Question:   Preferred imaging location?    Answer:   [place]  . Sedimentation rate    Standing Status:   Future    Number of Occurrences:   1    Standing Expiration Date:   02/05/2022  . ANA    Standing Status:   Future    Number of Occurrences:   1    Standing Expiration Date:   02/05/2022  . HLA-B27 antigen    Standing Status:   Future    Number of Occurrences:   1    Standing Expiration Date:   02/05/2022  . Uric acid     Standing Status:   Future    Number of Occurrences:   1    Standing Expiration Date:   02/05/2022  . CK    Standing Status:   Future    Number of Occurrences:   1    Standing Expiration Date:   02/05/2022  . Rheumatoid factor    Standing Status:   Future    Number of Occurrences:   1    Standing Expiration Date:  02/05/2022  . Cyclic citrul peptide antibody, IgG    Standing Status:   Future    Number of Occurrences:   1    Standing Expiration Date:   02/05/2022   Meds ordered this encounter  Medications  . nitroGLYCERIN (NITRODUR - DOSED IN MG/24 HR) 0.2 mg/hr patch    Sig: Apply 1/4 patch daily to tendon for tendonitis.    Dispense:  30 patch    Refill:  1    Discussed warning signs or symptoms. Please see discharge instructions. Patient expresses understanding.   The above documentation has been reviewed and is accurate and complete Lynne Leader, M.D.

## 2021-02-05 ENCOUNTER — Ambulatory Visit: Payer: No Typology Code available for payment source | Admitting: Family Medicine

## 2021-02-05 ENCOUNTER — Other Ambulatory Visit (HOSPITAL_COMMUNITY): Payer: Self-pay

## 2021-02-05 ENCOUNTER — Encounter: Payer: Self-pay | Admitting: Family Medicine

## 2021-02-05 ENCOUNTER — Ambulatory Visit: Payer: Self-pay

## 2021-02-05 ENCOUNTER — Other Ambulatory Visit: Payer: Self-pay

## 2021-02-05 VITALS — BP 124/86 | HR 76 | Ht 68.0 in | Wt 348.0 lb

## 2021-02-05 DIAGNOSIS — G8929 Other chronic pain: Secondary | ICD-10-CM | POA: Diagnosis not present

## 2021-02-05 DIAGNOSIS — M79671 Pain in right foot: Secondary | ICD-10-CM

## 2021-02-05 DIAGNOSIS — L409 Psoriasis, unspecified: Secondary | ICD-10-CM

## 2021-02-05 LAB — CK: Total CK: 43 U/L (ref 7–177)

## 2021-02-05 LAB — SEDIMENTATION RATE: Sed Rate: 16 mm/hr (ref 0–30)

## 2021-02-05 LAB — URIC ACID: Uric Acid, Serum: 7.4 mg/dL — ABNORMAL HIGH (ref 2.4–7.0)

## 2021-02-05 MED ORDER — NITROGLYCERIN 0.2 MG/HR TD PT24
MEDICATED_PATCH | TRANSDERMAL | 1 refills | Status: DC
  Filled 2021-02-05: qty 22, 88d supply, fill #0
  Filled 2021-02-14 – 2021-03-29 (×2): qty 7, 28d supply, fill #0

## 2021-02-05 NOTE — Progress Notes (Signed)
CK is normal.  Sed rate is 16 normal.  Uric acid is minimally elevated at 7.4 which is probably okay to leave alone for now.  Other labs are still pending.

## 2021-02-05 NOTE — Patient Instructions (Addendum)
Thank you for coming in today.  Please get labs today before you leave  Do the exercises.   Recheck in 1 month.   Please go to [facility] supply to get the Cam [place] Rile we talked about today. You may also be able to get it from [employer].

## 2021-02-07 LAB — HLA-B27 ANTIGEN: HLA-B27 Antigen: NEGATIVE

## 2021-02-07 LAB — CYCLIC CITRUL PEPTIDE ANTIBODY, IGG: Cyclic Citrullin Peptide Ab: <16 UNITS

## 2021-02-07 LAB — RHEUMATOID FACTOR: Rheumatoid fact SerPl-aCnc: <14 IU/mL (ref <14)

## 2021-02-07 LAB — ANA: Anti Nuclear Antibody (ANA): NEGATIVE

## 2021-02-07 NOTE — Progress Notes (Signed)
Further rheumatologic labs look normal.

## 2021-02-14 ENCOUNTER — Other Ambulatory Visit (HOSPITAL_COMMUNITY): Payer: Self-pay

## 2021-03-02 ENCOUNTER — Ambulatory Visit: Payer: No Typology Code available for payment source | Admitting: Family Medicine

## 2021-03-06 ENCOUNTER — Ambulatory Visit: Payer: No Typology Code available for payment source | Admitting: Family Medicine

## 2021-03-14 ENCOUNTER — Other Ambulatory Visit (HOSPITAL_COMMUNITY): Payer: Self-pay

## 2021-03-14 ENCOUNTER — Ambulatory Visit (INDEPENDENT_AMBULATORY_CARE_PROVIDER_SITE_OTHER): Payer: No Typology Code available for payment source | Admitting: Family Medicine

## 2021-03-14 ENCOUNTER — Encounter: Payer: Self-pay | Admitting: Family Medicine

## 2021-03-14 ENCOUNTER — Other Ambulatory Visit (HOSPITAL_BASED_OUTPATIENT_CLINIC_OR_DEPARTMENT_OTHER): Payer: Self-pay

## 2021-03-14 ENCOUNTER — Other Ambulatory Visit: Payer: Self-pay

## 2021-03-14 VITALS — BP 120/88 | HR 82 | Temp 98.4°F | Ht 68.0 in | Wt 353.0 lb

## 2021-03-14 DIAGNOSIS — E890 Postprocedural hypothyroidism: Secondary | ICD-10-CM

## 2021-03-14 DIAGNOSIS — Z975 Presence of (intrauterine) contraceptive device: Secondary | ICD-10-CM | POA: Diagnosis not present

## 2021-03-14 DIAGNOSIS — F339 Major depressive disorder, recurrent, unspecified: Secondary | ICD-10-CM

## 2021-03-14 MED ORDER — ESCITALOPRAM OXALATE 20 MG PO TABS
20.0000 mg | ORAL_TABLET | Freq: Every day | ORAL | 1 refills | Status: DC
  Filled 2021-03-14: qty 90, 90d supply, fill #0
  Filled 2021-06-11: qty 90, 90d supply, fill #1

## 2021-03-14 MED ORDER — SYNTHROID 125 MCG PO TABS
125.0000 ug | ORAL_TABLET | Freq: Every day | ORAL | 11 refills | Status: DC
  Filled 2021-03-14: qty 90, 90d supply, fill #0
  Filled 2021-03-14: qty 30, 30d supply, fill #0
  Filled 2021-04-11: qty 30, 30d supply, fill #1
  Filled 2021-05-16 (×2): qty 30, 30d supply, fill #2

## 2021-03-14 NOTE — Progress Notes (Signed)
Subjective  CC:  Chief Complaint  Patient presents with  . Depression    HPI: Olivia [name] is a 55 y.o. female who presents to the office today to address the problems listed above in the chief complaint, mood problems.  Olivia [name] returns to follow-up on her depression.  Please see last note.  We started Lexapro 6 weeks ago.  She has been suffering from depression.  Also working to get her thyroid back under control.  They have been needing to decrease the dose considerably.  She feels Lexapro has increased and improved her mood.  She is less sad and has a little more energy, however she continues to suffer significantly.  Motivation remains low.  Struggling to manage the multiple responsibilities that she has.  Sleep is disturbed.  Cannot exercise due to ongoing foot pain, however that is improving.  No adverse effects from the Lexapro. She denies current suicidal or homicidal plan or intent.  She denies panic attacks.  Supportive husband.  Supportive boss at work.  Hypothyroidism: She is due for recheck next week with endocrinology.  Perimenopausal: Hormonal changes could be contributing to some of her sleep disturbances and irritability.  She does have an IUD in place and is amenorrheic.  She and her gynecologist to decide to keep it in for 1 more year. Depression screen [facility] 2/9 03/14/2021 05/01/2020 07/15/2019  Decreased Interest 3 3 0  Down, Depressed, Hopeless 3 3 0  PHQ - 2 Score 6 6 0  Altered sleeping 3 3 3   Tired, decreased energy 3 3 3   Change in appetite 2 3 0  Feeling bad or failure about yourself  3 3 2   Trouble concentrating 3 3 3   Moving slowly or fidgety/restless 0 3 1  Suicidal thoughts 0 0 0  PHQ-9 Score 20 24 12   Difficult doing work/chores Extremely dIfficult Somewhat difficult Somewhat difficult  Some recent data might be hidden   GAD 7 : Generalized Anxiety Score 08/18/2020  Nervous, Anxious, on Edge 3  Control/stop worrying 3  Worry too much - different  things 3  Trouble relaxing 3  Restless 3  Easily annoyed or irritable 3  Afraid - awful might happen 3  Total GAD 7 Score 21  Anxiety Difficulty Extremely difficult    Assessment  1. Major depression, recurrent, chronic ([place])   2. Morbid obesity ([place])   3. Post-surgical hypothyroidism   4. IUD (intrauterine device) in place, Liletta      Plan   Depression: Remains very active.  Continues to have very heavy workload.  She seemed to impact her wall last year but her father became very ill.  Fortunately he has recovered.  However, her clinical depression is very active.  Counseling was done.  She will seek out counseling through her EAP program.  Lexapro has helped, will increase dose to 20 mg daily.  Encouraged her to look at her schedule and start pulling back so that she can manage and balance her lifestyle and health care with her workload.  She is frustrated with her weight.  Discussed during small exercise program at home.  Today's visit was 42 minutes long. Greater than 50% of this time was devoted to face to face counseling with the patient and coordination of care. We discussed her diagnosis, prognosis, treatment options and treatment plan is documented above.    Follow up: 6 weeks to recheck mood No orders of the defined types were placed in this encounter.  Meds ordered this  encounter  Medications  . escitalopram (LEXAPRO) 20 MG tablet    Sig: Take 1 tablet (20 mg total) by mouth daily.    Dispense:  90 tablet    Refill:  1      I reviewed the patients updated PMH, FH, and SocHx.    Patient Active Problem List   Diagnosis Date Noted  . Major depression, recurrent, chronic ([place]) 11/17/2018    Priority: High  . History of laparoscopic adjustable gastric banding 05/08/2012    Priority: High  . Mixed hyperlipidemia 04/15/2008    Priority: High  . Morbid obesity ([place]) 04/15/2008    Priority: High  . Ventricular bigeminy 11/17/2018    Priority: Medium  . Atopic  dermatitis 08/19/2018    Priority: Medium  . Elevated coronary artery calcium score, 90th percentile for age and gender 08/16/2018    Priority: Medium  . Psoriasis 01/05/2018    Priority: Medium  . IUD (intrauterine device) in place, Liletta 12/07/2017    Priority: Medium  . PCOS (polycystic ovarian syndrome) 10/25/2014    Priority: Medium  . Migraine with aura 01/11/2014    Priority: Medium  . Vitamin D deficiency 01/05/2018    Priority: Low  . Allergic rhinitis 04/15/2008    Priority: Low  . Allergic asthma 04/15/2008    Priority: Low  . History of total thyroidectomy 01/17/2021  . History of parathyroid surgery 2021 01/17/2021  . Post-surgical hypothyroidism 01/17/2021  . Hyperparathyroidism, primary ([place]) 05/22/2020  . Chronic low back pain with sciatica 01/05/2018   Current Meds  Medication Sig  . albuterol (PROVENTIL HFA) 108 (90 Base) MCG/ACT inhaler Inhale 2 puffs into the lungs every 6 (six) hours as needed for wheezing or shortness of breath.  Marland Kitchen albuterol (PROVENTIL) (2.5 MG/3ML) 0.083% nebulizer solution Take 3 mLs (2.5 mg total) by nebulization every 6 (six) hours as needed for wheezing or shortness of breath.  . ALPRAZolam (XANAX) 0.5 MG tablet Take 1 tablet (0.5 mg total) by mouth 2 (two) times daily as needed for anxiety.  . cetirizine (ZYRTEC) 10 MG tablet Take 10 mg by mouth daily as needed for allergies.   Marland Kitchen diclofenac sodium (VOLTAREN) 1 % GEL Apply topically to affected area qid (Patient taking differently: Apply 1 application topically 4 (four) times daily as needed (pain).)  . escitalopram (LEXAPRO) 20 MG tablet Take 1 tablet (20 mg total) by mouth daily.  . famotidine (PEPCID) 20 MG tablet Take 20 mg by mouth daily as needed for heartburn or indigestion.  Marland Kitchen levothyroxine (SYNTHROID) 137 MCG tablet Take 1 tablet (137 mcg total) by mouth daily.  . magnesium 30 MG tablet Take 30 mg by mouth daily.  . metoprolol tartrate (LOPRESSOR) 25 MG tablet Take 12.5 mg by  mouth daily as needed. TAKE 1/2 TABLET BY MOUTH 2 TIMES DAILY or as directed.  . Multiple Vitamin (MULITIVITAMIN WITH MINERALS) TABS Take 1 tablet by mouth daily.  . nitroGLYCERIN (NITRODUR - DOSED IN MG/24 HR) 0.2 mg/hr patch Apply 1/4 patch daily to tendon for tendonitis.  . rosuvastatin (CRESTOR) 10 MG tablet TAKE 1 TABLET (10 MG TOTAL) BY MOUTH DAILY.  . traZODone (DESYREL) 50 MG tablet TAKE 1/2-1 TABLET BY MOUTH AT BEDTIME AS NEEDED FOR SLEEP.  . Turmeric (QC TUMERIC COMPLEX PO) Take 1 capsule by mouth daily.   . [DISCONTINUED] escitalopram (LEXAPRO) 10 MG tablet Take 1 tablet (10 mg total) by mouth daily.  . [DISCONTINUED] escitalopram (LEXAPRO) 10 MG tablet TAKE 1 TABLET (10 MG TOTAL) BY  MOUTH DAILY.    Allergies: Patient is allergic to cephalosporins, eggs or egg-derived products, penicillins, sulfa drugs cross reactors, adhesive [tape], and effexor [venlafaxine]. Family history:  Patient family history includes Colon cancer in her mother; Colon polyps in her mother; Diabetes in her mother; Hypertension in her father; Obesity in her father, mother, and sister. Social History   Socioeconomic History  . Marital status: Married    Spouse name: Not on file  . Number of children: Not on file  . Years of education: Not on file  . Highest education level: Not on file  Occupational History  . Occupation: NP    Employer: [employer]  Tobacco Use  . Smoking status: Never Smoker  . Smokeless tobacco: Never Used  Vaping Use  . Vaping Use: Never used  Substance and Sexual Activity  . Alcohol use: No  . Drug use: No  . Sexual activity: Yes    Partners: Male    Birth control/protection: I.U.D.    Comment: Vasectomy in spouse  Other Topics Concern  . Not on file  Social History Narrative  . Not on file   Social Determinants of Health   Financial Resource Strain: Not on file  Food Insecurity: Not on file  Transportation Needs: Not on file  Physical Activity: Not on file   Stress: Not on file  Social Connections: Not on file     Review of Systems: Constitutional: Negative for fever malaise or anorexia Cardiovascular: negative for chest pain Respiratory: negative for SOB or persistent cough Gastrointestinal: negative for abdominal pain  Objective  Vitals: BP 120/88   [name] 82   Temp 98.4 F (36.9 C) (Temporal)   Ht 5\' 8"  (1.727 m)   Wt (!) 353 lb (160.1 kg)   SpO2 97%   BMI 53.67 kg/m  General: no acute distress, well appearing, no apparent distress, well groomed Psych:  Alert and oriented x 3,normal mood, behavior, speech, dress, and thought processes. depressed affect and good insight    Commons side effects, risks, benefits, and alternatives for medications and treatment plan prescribed today were discussed, and the patient expressed understanding of the given instructions. Patient is instructed to call or message via MyChart if he/she has any questions or concerns regarding our treatment plan. No barriers to understanding were identified. We discussed Red Flag symptoms and signs in detail. Patient expressed understanding regarding what to do in case of urgent or emergency type symptoms.   Medication list was reconciled, printed and provided to the patient in AVS. Patient instructions and summary information was reviewed with the patient as documented in the AVS. This note was prepared with assistance of Dragon voice recognition software. Occasional wrong-word or sound-a-like substitutions may have occurred due to the inherent limitations of voice recognition software

## 2021-03-14 NOTE — Patient Instructions (Signed)
Please return in 6 weeks for recheck.   Seek counseling again. Look at your schedule carefully and adjust it down to a more manageable level.   Once we get your brain chemistry improved and your thyroid adjusts, you will feel better again. But we need a long term sustainable system as well! :)  If you have any questions or concerns, please don't hesitate to send me a message via MyChart or call the office at [phone]. Thank you for visiting with [place] today! It's our pleasure caring for you.

## 2021-03-19 ENCOUNTER — Other Ambulatory Visit (HOSPITAL_BASED_OUTPATIENT_CLINIC_OR_DEPARTMENT_OTHER): Payer: Self-pay

## 2021-03-29 ENCOUNTER — Other Ambulatory Visit (HOSPITAL_BASED_OUTPATIENT_CLINIC_OR_DEPARTMENT_OTHER): Payer: Self-pay

## 2021-03-29 ENCOUNTER — Ambulatory Visit: Payer: No Typology Code available for payment source | Attending: Internal Medicine

## 2021-03-29 ENCOUNTER — Other Ambulatory Visit: Payer: Self-pay

## 2021-03-29 DIAGNOSIS — Z23 Encounter for immunization: Secondary | ICD-10-CM

## 2021-03-29 MED ORDER — COVID-19 MRNA VACC (MODERNA) 100 MCG/0.5ML IM SUSP
INTRAMUSCULAR | 0 refills | Status: DC
  Filled 2021-03-29: qty 0.25, 1d supply, fill #0

## 2021-03-29 NOTE — Progress Notes (Signed)
   Covid-19 Vaccination Clinic  Name:  Olivia [name]    MRN: [id number] DOB: 08-29-1966  03/29/2021  Olivia [name] was observed post Covid-19 immunization for 15 minutes without incident. She was provided with Vaccine Information Sheet and instruction to access the V-Safe system.   Olivia [name] was instructed to call 911 with any severe reactions post vaccine: Marland Kitchen Difficulty breathing  . Swelling of face and throat  . A fast heartbeat  . A bad rash all over body  . Dizziness and weakness   Immunizations Administered    Name Date Dose VIS Date Route   Moderna Covid-19 Booster Vaccine 03/29/2021  3:58 PM 0.25 mL 08/23/2020 Intramuscular   Manufacturer: Moderna   Lot: [id number]   [place]: [id number]

## 2021-03-30 ENCOUNTER — Other Ambulatory Visit (HOSPITAL_BASED_OUTPATIENT_CLINIC_OR_DEPARTMENT_OTHER): Payer: Self-pay

## 2021-04-11 ENCOUNTER — Other Ambulatory Visit (HOSPITAL_BASED_OUTPATIENT_CLINIC_OR_DEPARTMENT_OTHER): Payer: Self-pay

## 2021-04-13 ENCOUNTER — Other Ambulatory Visit (HOSPITAL_BASED_OUTPATIENT_CLINIC_OR_DEPARTMENT_OTHER): Payer: Self-pay

## 2021-04-16 ENCOUNTER — Other Ambulatory Visit (HOSPITAL_BASED_OUTPATIENT_CLINIC_OR_DEPARTMENT_OTHER): Payer: Self-pay

## 2021-04-25 ENCOUNTER — Other Ambulatory Visit: Payer: Self-pay

## 2021-04-25 ENCOUNTER — Ambulatory Visit (INDEPENDENT_AMBULATORY_CARE_PROVIDER_SITE_OTHER): Payer: No Typology Code available for payment source | Admitting: Family Medicine

## 2021-04-25 ENCOUNTER — Encounter: Payer: Self-pay | Admitting: Family Medicine

## 2021-04-25 VITALS — BP 121/79 | HR 74 | Temp 98.6°F | Ht 68.0 in | Wt 351.2 lb

## 2021-04-25 DIAGNOSIS — E890 Postprocedural hypothyroidism: Secondary | ICD-10-CM

## 2021-04-25 DIAGNOSIS — R112 Nausea with vomiting, unspecified: Secondary | ICD-10-CM

## 2021-04-25 DIAGNOSIS — Z9884 Bariatric surgery status: Secondary | ICD-10-CM

## 2021-04-25 DIAGNOSIS — F5102 Adjustment insomnia: Secondary | ICD-10-CM

## 2021-04-25 DIAGNOSIS — F339 Major depressive disorder, recurrent, unspecified: Secondary | ICD-10-CM

## 2021-04-25 DIAGNOSIS — R197 Diarrhea, unspecified: Secondary | ICD-10-CM

## 2021-04-25 NOTE — Progress Notes (Signed)
Subjective  CC:  Chief Complaint  Patient presents with   Depression   GI Problem    Gotten worse since depression medication increase. Diarrhea, nausea and vomiting    HPI: Olivia [name] is a 55 y.o. female who presents to the office today to address the problems listed above in the chief complaint. Follow-up mood: Difficult and complicated case.  Reports mood is slightly better but still does not feel herself at all.  Feels like she had a wall, burnout with her job.  Motivation is low.  Thinking is scattered.  Feels tired all the time.  Thinks that Lexapro has helped her motivation a little bit.  She is continuing to be able to do what she needs to for her job.  However having problems with insomnia.  Reports that she was up for 3 days in a row at 1 point.  Lays awake.  Believes this is related to Lexapro.  Start taking it earlier and it is helped somewhat.  Denies manic symptoms.  No history of mood problems.  Not sure if her thyroid is playing a role as thyroid medications continue to be adjusted downward.  His reached out to EAP program but not has not yet been able to schedule.  Overall feeling quite terrible. Now complaining of diarrhea for several months.  Loose watery stools.  Multiple times per day at this time.  No odor, blood or mucus.  Occasional predefecation spasm but no frank abdominal pain.  Also admits to nausea with vomiting but this is not uncommon given her history of gastric band surgery.  She is not sure if it is related to the medications or anxiety or other problem.  She does have a GI doctor.  She is not eating well.  Tends to fast. Morbid obesity: Weight continues to climb in spite of trying to eat less.  This worries her greatly  Assessment  1. Major depression, recurrent, chronic ([place])   2. Diarrhea, unspecified type   3. Post-surgical hypothyroidism   4. History of laparoscopic adjustable gastric banding   5. Morbid obesity ([place])   6. Adjustment insomnia    7. Non-intractable vomiting with nausea, unspecified vomiting type      Plan  Mood disorder: At this point, difficult for me to sort out.  Sounds like she is burned-out.  Possible PTSD.  Or other mood problems.  No panic symptoms.  She wants to stay on the Lexapro in spite of possible side effects including diarrhea.  Recommend psychotherapy and possible psychiatry referral to help sort out. Insomnia worsening: Continue trazodone but increase dose to 100 mg nightly. Postsurgical hypothyroidism with history of parathyroidectomy: She reports most symptoms started after her surgery.  Endocrine is managing her thyroid medication.  This could be affecting some of her symptoms Morbid obesity: Recommend small frequent meals given her gastric banding.  Fasting he is unhealthy. Diarrhea with nausea and vomiting: Refer back to GI who knows her well.  Needs further evaluation.  Unclear if GI related or more related to anxiety and medications.  Follow up: Complete physical as scheduled complete physical as scheduled 05/04/2021  Orders Placed This Encounter  Procedures   Ambulatory referral to Gastroenterology   No orders of the defined types were placed in this encounter.     I reviewed the patients updated PMH, FH, and SocHx.    Patient Active Problem List   Diagnosis Date Noted   Major depression, recurrent, chronic ([place]) 11/17/2018    Priority:  High   History of laparoscopic adjustable gastric banding 05/08/2012    Priority: High   Mixed hyperlipidemia 04/15/2008    Priority: High   Morbid obesity ([place]) 04/15/2008    Priority: High   Ventricular bigeminy 11/17/2018    Priority: Medium   Atopic dermatitis 08/19/2018    Priority: Medium   Elevated coronary artery calcium score, 90th percentile for age and gender 08/16/2018    Priority: Medium   Psoriasis 01/05/2018    Priority: Medium   IUD (intrauterine device) in place, Liletta 12/07/2017    Priority: Medium   PCOS (polycystic  ovarian syndrome) 10/25/2014    Priority: Medium   Migraine with aura 01/11/2014    Priority: Medium   Vitamin D deficiency 01/05/2018    Priority: Low   Allergic rhinitis 04/15/2008    Priority: Low   Allergic asthma 04/15/2008    Priority: Low   History of total thyroidectomy 01/17/2021   History of parathyroid surgery 2021 01/17/2021   Post-surgical hypothyroidism 01/17/2021   Hyperparathyroidism, primary ([place]) 05/22/2020   Chronic low back pain with sciatica 01/05/2018   Current Meds  Medication Sig   albuterol (PROVENTIL HFA) 108 (90 Base) MCG/ACT inhaler Inhale 2 puffs into the lungs every 6 (six) hours as needed for wheezing or shortness of breath.   albuterol (PROVENTIL) (2.5 MG/3ML) 0.083% nebulizer solution Take 3 mLs (2.5 mg total) by nebulization every 6 (six) hours as needed for wheezing or shortness of breath.   ALPRAZolam (XANAX) 0.5 MG tablet Take 1 tablet (0.5 mg total) by mouth 2 (two) times daily as needed for anxiety.   cetirizine (ZYRTEC) 10 MG tablet Take 10 mg by mouth daily as needed for allergies.    COVID-19 mRNA vaccine, Moderna, 100 MCG/0.5ML injection Inject into the muscle.   diclofenac sodium (VOLTAREN) 1 % GEL Apply topically to affected area qid (Patient taking differently: Apply 1 application topically 4 (four) times daily as needed (pain).)   escitalopram (LEXAPRO) 20 MG tablet Take 1 tablet (20 mg total) by mouth daily.   famotidine (PEPCID) 20 MG tablet Take 20 mg by mouth daily as needed for heartburn or indigestion.   levothyroxine (SYNTHROID) 137 MCG tablet Take 1 tablet (137 mcg total) by mouth daily.   magnesium 30 MG tablet Take 30 mg by mouth daily.   metoprolol tartrate (LOPRESSOR) 25 MG tablet Take 12.5 mg by mouth daily as needed. TAKE 1/2 TABLET BY MOUTH 2 TIMES DAILY or as directed.   Multiple Vitamin (MULITIVITAMIN WITH MINERALS) TABS Take 1 tablet by mouth daily.   nitroGLYCERIN (NITRODUR - DOSED IN MG/24 HR) 0.2 mg/hr patch Apply 1/4  patch daily to tendon for tendonitis.   rosuvastatin (CRESTOR) 10 MG tablet TAKE 1 TABLET (10 MG TOTAL) BY MOUTH DAILY.   SYNTHROID 125 MCG tablet Take 1 tablet (125 mcg total) by mouth daily in the morning on an empty stomach.   traZODone (DESYREL) 50 MG tablet TAKE 1/2-1 TABLET BY MOUTH AT BEDTIME AS NEEDED FOR SLEEP.   Vitamin D, Cholecalciferol, 50 MCG (2000 UT) CAPS Take 2,000 Units by mouth daily.    Allergies: Patient is allergic to cephalosporins, eggs or egg-derived products, penicillins, sulfa drugs cross reactors, adhesive [tape], and effexor [venlafaxine]. Family History: Patient family history includes Colon cancer in her mother; Colon polyps in her mother; Diabetes in her mother; Hypertension in her father; Obesity in her father, mother, and sister. Social History:  Patient  reports that she has never smoked. She has never used  smokeless tobacco. She reports that she does not drink alcohol and does not use drugs.  Review of Systems: Constitutional: Negative for fever malaise or anorexia Cardiovascular: negative for chest pain Respiratory: negative for SOB or persistent cough Gastrointestinal: negative for abdominal pain  Objective  Vitals: BP 121/79   Pulse 74   Temp 98.6 F (37 C) (Temporal)   Ht 5\' 8"  (1.727 m)   Wt (!) 351 lb 3.2 oz (159.3 kg)   SpO2 95%   BMI 53.40 kg/m  General: no acute distress , A&Ox3 HEENT: PEERL, conjunctiva normal, neck is supple Cardiovascular:  RRR without murmur or gallop.  Respiratory:  Good breath sounds bilaterally, CTAB with normal respiratory effort Skin:  Warm, no rashes    Commons side effects, risks, benefits, and alternatives for medications and treatment plan prescribed today were discussed, and the patient expressed understanding of the given instructions. Patient is instructed to call or message via MyChart if he/she has any questions or concerns regarding our treatment plan. No barriers to understanding were identified.  We discussed Red Flag symptoms and signs in detail. Patient expressed understanding regarding what to do in case of urgent or emergency type symptoms.  Medication list was reconciled, printed and provided to the patient in AVS. Patient instructions and summary information was reviewed with the patient as documented in the AVS. This note was prepared with assistance of Dragon voice recognition software. Occasional wrong-word or sound-a-like substitutions may have occurred due to the inherent limitations of voice recognition software  This visit occurred during the SARS-CoV-2 public health emergency.  Safety protocols were in place, including screening questions prior to the visit, additional usage of staff PPE, and extensive cleaning of exam room while observing appropriate contact time as indicated for disinfecting solutions.

## 2021-04-25 NOTE — Patient Instructions (Signed)
Please follow up as scheduled for your next visit with me: 05/04/2021   If you have any questions or concerns, please don't hesitate to send me a message via MyChart or call the office at [phone]. Thank you for visiting with [place] today! It's our pleasure caring for you.   Increase the trazodone to 75-100mg  nightly.  Seek counseling.  Conitnue lexapro.  I have placed a referral back to Dr. Collene Mares to further investigate the GI symptoms.

## 2021-05-04 ENCOUNTER — Encounter: Payer: Self-pay | Admitting: Family Medicine

## 2021-05-04 ENCOUNTER — Other Ambulatory Visit: Payer: Self-pay

## 2021-05-04 ENCOUNTER — Ambulatory Visit (INDEPENDENT_AMBULATORY_CARE_PROVIDER_SITE_OTHER): Payer: No Typology Code available for payment source | Admitting: Family Medicine

## 2021-05-04 VITALS — BP 132/76 | HR 80 | Temp 99.4°F | Ht 68.0 in | Wt 352.0 lb

## 2021-05-04 DIAGNOSIS — Z0000 Encounter for general adult medical examination without abnormal findings: Secondary | ICD-10-CM

## 2021-05-04 DIAGNOSIS — E559 Vitamin D deficiency, unspecified: Secondary | ICD-10-CM

## 2021-05-04 DIAGNOSIS — E782 Mixed hyperlipidemia: Secondary | ICD-10-CM

## 2021-05-04 DIAGNOSIS — E890 Postprocedural hypothyroidism: Secondary | ICD-10-CM

## 2021-05-04 DIAGNOSIS — E282 Polycystic ovarian syndrome: Secondary | ICD-10-CM | POA: Diagnosis not present

## 2021-05-04 NOTE — Patient Instructions (Signed)
Please return in 6 months for recheck. Please schedule nurse visit next week for shingrix #1 of 2 and Prevnar 20.   If you have any questions or concerns, please don't hesitate to send me a message via MyChart or call the office at [phone]. Thank you for visiting with [place] today! It's our pleasure caring for you.

## 2021-05-04 NOTE — Progress Notes (Signed)
Subjective  Chief Complaint  Patient presents with   Annual Exam    HPI: Olivia [name] is a 55 y.o. female who presents to [facility] today for a Female Wellness Visit. She also has the concerns and/or needs as listed above in the chief complaint. These will be addressed in addition to the Health Maintenance Visit.   Wellness Visit: annual visit with health maintenance review and exam without Pap  HM: up to date. Sees gyn.  Chronic disease f/u and/or acute problem visit: (deemed necessary to be done in addition to the wellness visit): Reviewed chronic medical problems. Reviewed labs from march. All controlled. Workign on weight loss, mood and sleep. No changes from recent visit except increased trazadone dose is helping.  Seeing out counseling but hasn't yet heard back from [facility]. Has called 3 times.  Stress increased due to recent reversal of Roe vs Alveta Heimlich; she works with sexual assault/sexual health and contraception.   Assessment  1. Annual physical exam   2. Mixed hyperlipidemia   3. Morbid obesity ([place])   4. PCOS (polycystic ovarian syndrome)   5. Vitamin D deficiency   6. Post-surgical hypothyroidism      Plan  Female Wellness Visit: Age appropriate Health Maintenance and Prevention measures were discussed with patient. Included topics are cancer screening recommendations, ways to keep healthy (see AVS) including dietary and exercise recommendations, regular eye and dental care, use of seat belts, and avoidance of moderate alcohol use and tobacco use. Screens are up to date BMI: discussed patient's BMI and encouraged positive lifestyle modifications to help get to or maintain a target BMI. HM needs and immunizations were addressed and ordered. See below for orders. See HM and immunization section for updates. Will return for prevnar 20 and shingrix.  Routine labs and screening tests ordered including cmp, cbc and lipids where  appropriate. Discussed recommendations regarding Vit D and calcium supplementation (see AVS)  Chronic disease management visit and/or acute problem visit: Chronic conditions are stable.  Follow up: No follow-ups on file.  No orders of the defined types were placed in this encounter.  No orders of the defined types were placed in this encounter.     Body mass index is 53.52 kg/m. Wt Readings from Last 3 Encounters:  05/04/21 (!) 352 lb (159.7 kg)  04/25/21 (!) 351 lb 3.2 oz (159.3 kg)  03/14/21 (!) 353 lb (160.1 kg)     Patient Active Problem List   Diagnosis Date Noted   Post-surgical hypothyroidism 01/17/2021    Priority: High   Major depression, recurrent, chronic ([place]) 11/17/2018    Priority: High   History of laparoscopic adjustable gastric banding 05/08/2012    Priority: High    CT 11/19: Small hiatal hernia. Distal circumferential esophageal wall thickening may reflect esophagitis. Lost the 100 pounds from the surgery and then gained it back.      Mixed hyperlipidemia 04/15/2008    Priority: High   Morbid obesity ([place]) 04/15/2008    Priority: High   Ventricular bigeminy 11/17/2018    Priority: Medium    2019: cards eval; intolerant to beta blockers     Atopic dermatitis 08/19/2018    Priority: Medium   Elevated coronary artery calcium score, 90th percentile for age and gender 08/16/2018    Priority: Medium    IMPRESSION: 1. Coronary artery calcium score 9.5 Agatston units. This places the patient in the 90th percentile for age and gender. This suggests high risk  for future cardiac events. 2. This study is limited by artifact. However, there only appears tbe plaque in the proximal LAD. There is no more than mild stenosis.     Psoriasis 01/05/2018    Priority: Medium   IUD (intrauterine device) in place, Liletta 12/07/2017    Priority: Medium   PCOS (polycystic ovarian syndrome) 10/25/2014    Priority: Medium   Migraine with aura 01/11/2014    Priority:  Medium   Vitamin D deficiency 01/05/2018    Priority: Low   Allergic rhinitis 04/15/2008    Priority: Low   Allergic asthma 04/15/2008    Priority: Low   History of total thyroidectomy 01/17/2021    Due to multiple nodules     History of parathyroid surgery 2021 01/17/2021    For primary hyperparathyroidism. Dr. Harlow Asa     Hyperparathyroidism, primary ([place]) 05/22/2020   Chronic low back pain with sciatica 01/05/2018   Health Maintenance  Topic Date Due   Pneumococcal Vaccine 74-15 Years old (1 - PCV) Never done   Zoster Vaccines- Shingrix (1 of 2) Never done   INFLUENZA VACCINE  06/04/2021   COVID-19 Vaccine (5 - Booster for Pfizer series) 07/30/2021   MAMMOGRAM  09/27/2021   PAP SMEAR-Modifier  09/04/2023   COLONOSCOPY (Pts 45-48yrs Insurance coverage will need to be confirmed)  09/30/2027   TETANUS/TDAP  05/01/2030   Hepatitis C Screening  Completed   HIV Screening  Completed   HPV VACCINES  Aged Out   Immunization History  Administered Date(s) Administered   Influenza Inj Mdck Quad Pf 07/21/2020   Influenza, Quadrivalent, Recombinant, Inj, Pf 08/03/2019   Influenza,inj,Quad PF,6+ Mos 08/07/2018, 07/21/2020   Influenza-Unspecified 10/25/2014   Moderna SARS-COV2 Booster Vaccination 03/29/2021   PFIZER(Purple Top)SARS-COV-2 Vaccination 10/27/2019, 11/17/2019, 08/18/2020   Tdap 05/01/2020   We updated and reviewed the patient's past history in detail and it is documented below. Allergies: Patient is allergic to cephalosporins, eggs or egg-derived products, penicillins, sulfa drugs cross reactors, adhesive [tape], and effexor [venlafaxine]. Past Medical History Patient  has a past medical history of Anxiety, Asthma, Environmental allergies, Hiatal hernia, Hiatal hernia without gangrene or obstruction (08/19/2018), History of parathyroid surgery 2021 (01/17/2021), HLD (hyperlipidemia) (04/15/2008), Hypertension, Microcytic anemia, Migraine, hormonal, Multiple thyroid nodules  (08/19/2018), PAC (premature atrial contraction), PACs (premature atrial contraction), Palpitations, PCOS (polycystic ovarian syndrome) (10/25/2014), PONV (postoperative nausea and vomiting), Pre-diabetes, Reflux, and Seasonal allergies. Past Surgical History Patient  has a past surgical history that includes Tonsillectomy (1981); Laparoscopic gastric banding (08/08/2008); Hiatal hernia repair (04/14/2012); Thyroidectomy (N/A, 06/02/2020); and Parathyroidectomy (Left, 06/02/2020). Family History: Patient family history includes Colon cancer in her mother; Colon polyps in her mother; Diabetes in her mother; Hypertension in her father; Obesity in her father, mother, and sister. Social History:  Patient  reports that she has never smoked. She has never used smokeless tobacco. She reports that she does not drink alcohol and does not use drugs.  Review of Systems: Constitutional: negative for fever or malaise Ophthalmic: negative for photophobia, double vision or loss of vision Cardiovascular: negative for chest pain, dyspnea on exertion, or new LE swelling Respiratory: negative for SOB or persistent cough Gastrointestinal: negative for abdominal pain, change in bowel habits or melena Genitourinary: negative for dysuria or gross hematuria, no abnormal uterine bleeding or disharge Musculoskeletal: negative for new gait disturbance or muscular weakness Integumentary: negative for new or persistent rashes, no breast lumps Neurological: negative for TIA or stroke symptoms Psychiatric: negative for SI or delusions Allergic/Immunologic: negative for  hives  Patient Care Team    Relationship Specialty Notifications Start End  Leamon Arnt, MD PCP - General Family Medicine  05/01/20   Erline Levine, MD Consulting Physician Neurosurgery  05/06/18   Garner Nash, DO Consulting Physician Pulmonary Disease  11/17/18   Sherren Mocha, MD Consulting Physician Cardiology  11/17/18   Danella Sensing, MD  Consulting Physician Dermatology  11/17/18   Aloha Gell, MD Consulting Physician Obstetrics and Gynecology  11/17/18   Delrae Rend, MD Consulting Physician Endocrinology  12/19/19     Objective  Vitals: BP 132/76 (BP Location: Left Arm, Patient Position: Sitting, Cuff Size: Large)   Pulse 80   Temp 99.4 F (37.4 C) (Temporal)   Ht 5\' 8"  (1.727 m)   Wt (!) 352 lb (159.7 kg)   SpO2 97%   BMI 53.52 kg/m  General:  Well developed, well nourished, no acute distress  Psych:  Alert and orientedx3,normal mood and affect HEENT:  Normocephalic, atraumatic, non-icteric sclera,  supple neck without adenopathy, mass or thyromegaly Cardiovascular:  Normal S1, S2, RRR without gallop, rub or murmur Respiratory:  Good breath sounds bilaterally, CTAB with normal respiratory effort Gastrointestinal: normal bowel sounds, soft, non-tender, no noted masses. No HSM MSK: no deformities, contusions. Joints are without erythema or swelling.  Skin:  Warm, no rashes or suspicious lesions noted Neurologic:    Mental status is normal. CN 2-11 are normal. Gross motor and sensory exams are normal. Normal gait. No tremor   Commons side effects, risks, benefits, and alternatives for medications and treatment plan prescribed today were discussed, and the patient expressed understanding of the given instructions. Patient is instructed to call or message via MyChart if he/she has any questions or concerns regarding our treatment plan. No barriers to understanding were identified. We discussed Red Flag symptoms and signs in detail. Patient expressed understanding regarding what to do in case of urgent or emergency type symptoms.  Medication list was reconciled, printed and provided to the patient in AVS. Patient instructions and summary information was reviewed with the patient as documented in the AVS. This note was prepared with assistance of Dragon voice recognition software. Occasional wrong-word or sound-a-like  substitutions may have occurred due to the inherent limitations of voice recognition software  This visit occurred during the SARS-CoV-2 public health emergency.  Safety protocols were in place, including screening questions prior to the visit, additional usage of staff PPE, and extensive cleaning of exam room while observing appropriate contact time as indicated for disinfecting solutions.

## 2021-05-15 ENCOUNTER — Ambulatory Visit: Payer: No Typology Code available for payment source

## 2021-05-16 ENCOUNTER — Other Ambulatory Visit (HOSPITAL_BASED_OUTPATIENT_CLINIC_OR_DEPARTMENT_OTHER): Payer: Self-pay

## 2021-05-16 ENCOUNTER — Other Ambulatory Visit (HOSPITAL_COMMUNITY): Payer: Self-pay

## 2021-05-22 ENCOUNTER — Other Ambulatory Visit: Payer: Self-pay

## 2021-05-22 ENCOUNTER — Ambulatory Visit (INDEPENDENT_AMBULATORY_CARE_PROVIDER_SITE_OTHER): Payer: No Typology Code available for payment source

## 2021-05-22 DIAGNOSIS — Z23 Encounter for immunization: Secondary | ICD-10-CM | POA: Diagnosis not present

## 2021-05-28 ENCOUNTER — Ambulatory Visit (INDEPENDENT_AMBULATORY_CARE_PROVIDER_SITE_OTHER): Payer: No Typology Code available for payment source | Admitting: Psychology

## 2021-05-28 DIAGNOSIS — F331 Major depressive disorder, recurrent, moderate: Secondary | ICD-10-CM | POA: Diagnosis not present

## 2021-05-29 ENCOUNTER — Other Ambulatory Visit (HOSPITAL_BASED_OUTPATIENT_CLINIC_OR_DEPARTMENT_OTHER): Payer: Self-pay

## 2021-05-29 ENCOUNTER — Other Ambulatory Visit (HOSPITAL_COMMUNITY): Payer: Self-pay

## 2021-05-29 MED ORDER — LEVOTHYROXINE SODIUM 112 MCG PO TABS
112.0000 ug | ORAL_TABLET | Freq: Every morning | ORAL | 11 refills | Status: DC
Start: 2021-05-29 — End: 2022-05-16
  Filled 2021-05-29 (×2): qty 90, 90d supply, fill #0

## 2021-06-06 ENCOUNTER — Ambulatory Visit (INDEPENDENT_AMBULATORY_CARE_PROVIDER_SITE_OTHER): Payer: No Typology Code available for payment source | Admitting: Psychology

## 2021-06-06 DIAGNOSIS — F331 Major depressive disorder, recurrent, moderate: Secondary | ICD-10-CM

## 2021-06-11 ENCOUNTER — Other Ambulatory Visit (HOSPITAL_BASED_OUTPATIENT_CLINIC_OR_DEPARTMENT_OTHER): Payer: Self-pay

## 2021-06-12 ENCOUNTER — Ambulatory Visit (INDEPENDENT_AMBULATORY_CARE_PROVIDER_SITE_OTHER): Payer: No Typology Code available for payment source | Admitting: Psychology

## 2021-06-12 DIAGNOSIS — F321 Major depressive disorder, single episode, moderate: Secondary | ICD-10-CM

## 2021-06-14 ENCOUNTER — Other Ambulatory Visit (HOSPITAL_BASED_OUTPATIENT_CLINIC_OR_DEPARTMENT_OTHER): Payer: Self-pay

## 2021-06-19 ENCOUNTER — Ambulatory Visit (INDEPENDENT_AMBULATORY_CARE_PROVIDER_SITE_OTHER): Payer: No Typology Code available for payment source | Admitting: Psychology

## 2021-06-19 DIAGNOSIS — F331 Major depressive disorder, recurrent, moderate: Secondary | ICD-10-CM | POA: Diagnosis not present

## 2021-06-26 ENCOUNTER — Ambulatory Visit (INDEPENDENT_AMBULATORY_CARE_PROVIDER_SITE_OTHER): Payer: No Typology Code available for payment source | Admitting: Psychology

## 2021-06-26 DIAGNOSIS — F411 Generalized anxiety disorder: Secondary | ICD-10-CM

## 2021-07-03 ENCOUNTER — Ambulatory Visit (INDEPENDENT_AMBULATORY_CARE_PROVIDER_SITE_OTHER): Payer: No Typology Code available for payment source | Admitting: Psychology

## 2021-07-03 DIAGNOSIS — F331 Major depressive disorder, recurrent, moderate: Secondary | ICD-10-CM

## 2021-07-10 ENCOUNTER — Ambulatory Visit (INDEPENDENT_AMBULATORY_CARE_PROVIDER_SITE_OTHER): Payer: No Typology Code available for payment source | Admitting: Psychology

## 2021-07-10 DIAGNOSIS — F331 Major depressive disorder, recurrent, moderate: Secondary | ICD-10-CM | POA: Diagnosis not present

## 2021-07-17 ENCOUNTER — Ambulatory Visit (INDEPENDENT_AMBULATORY_CARE_PROVIDER_SITE_OTHER): Payer: No Typology Code available for payment source | Admitting: Psychology

## 2021-07-17 DIAGNOSIS — F331 Major depressive disorder, recurrent, moderate: Secondary | ICD-10-CM

## 2021-07-26 ENCOUNTER — Ambulatory Visit (INDEPENDENT_AMBULATORY_CARE_PROVIDER_SITE_OTHER): Payer: No Typology Code available for payment source | Admitting: Psychology

## 2021-07-26 DIAGNOSIS — F330 Major depressive disorder, recurrent, mild: Secondary | ICD-10-CM

## 2021-07-30 ENCOUNTER — Other Ambulatory Visit: Payer: Self-pay

## 2021-07-30 ENCOUNTER — Other Ambulatory Visit (HOSPITAL_BASED_OUTPATIENT_CLINIC_OR_DEPARTMENT_OTHER): Payer: Self-pay

## 2021-07-30 ENCOUNTER — Encounter: Payer: Self-pay | Admitting: Family Medicine

## 2021-07-30 ENCOUNTER — Other Ambulatory Visit (HOSPITAL_COMMUNITY): Payer: Self-pay

## 2021-07-30 MED ORDER — ALBUTEROL SULFATE HFA 108 (90 BASE) MCG/ACT IN AERS
2.0000 | INHALATION_SPRAY | Freq: Four times a day (QID) | RESPIRATORY_TRACT | 2 refills | Status: AC | PRN
  Filled 2021-07-30: qty 18, 25d supply, fill #0
  Filled 2021-09-14: qty 18, 17d supply, fill #0

## 2021-07-30 NOTE — Telephone Encounter (Signed)
Patient would like for prescription sent to [facility] at [facility]

## 2021-07-30 NOTE — Telephone Encounter (Signed)
Left a voicemail to confirm pharmacy

## 2021-07-31 ENCOUNTER — Ambulatory Visit (INDEPENDENT_AMBULATORY_CARE_PROVIDER_SITE_OTHER): Payer: No Typology Code available for payment source | Admitting: Psychology

## 2021-07-31 DIAGNOSIS — F330 Major depressive disorder, recurrent, mild: Secondary | ICD-10-CM | POA: Diagnosis not present

## 2021-08-07 ENCOUNTER — Ambulatory Visit (INDEPENDENT_AMBULATORY_CARE_PROVIDER_SITE_OTHER): Payer: No Typology Code available for payment source | Admitting: Psychology

## 2021-08-07 ENCOUNTER — Other Ambulatory Visit (HOSPITAL_COMMUNITY): Payer: Self-pay

## 2021-08-07 DIAGNOSIS — F411 Generalized anxiety disorder: Secondary | ICD-10-CM | POA: Diagnosis not present

## 2021-08-14 ENCOUNTER — Ambulatory Visit: Payer: No Typology Code available for payment source | Admitting: Psychology

## 2021-08-21 ENCOUNTER — Ambulatory Visit (INDEPENDENT_AMBULATORY_CARE_PROVIDER_SITE_OTHER): Payer: No Typology Code available for payment source | Admitting: Psychology

## 2021-08-21 DIAGNOSIS — F411 Generalized anxiety disorder: Secondary | ICD-10-CM

## 2021-08-29 ENCOUNTER — Ambulatory Visit (INDEPENDENT_AMBULATORY_CARE_PROVIDER_SITE_OTHER): Payer: No Typology Code available for payment source | Admitting: Psychology

## 2021-08-29 DIAGNOSIS — F330 Major depressive disorder, recurrent, mild: Secondary | ICD-10-CM

## 2021-09-04 ENCOUNTER — Ambulatory Visit (INDEPENDENT_AMBULATORY_CARE_PROVIDER_SITE_OTHER): Payer: No Typology Code available for payment source | Admitting: Psychology

## 2021-09-04 DIAGNOSIS — F331 Major depressive disorder, recurrent, moderate: Secondary | ICD-10-CM | POA: Diagnosis not present

## 2021-09-10 ENCOUNTER — Other Ambulatory Visit (HOSPITAL_COMMUNITY): Payer: Self-pay

## 2021-09-11 ENCOUNTER — Ambulatory Visit (INDEPENDENT_AMBULATORY_CARE_PROVIDER_SITE_OTHER): Payer: No Typology Code available for payment source | Admitting: Psychology

## 2021-09-11 DIAGNOSIS — F331 Major depressive disorder, recurrent, moderate: Secondary | ICD-10-CM

## 2021-09-12 ENCOUNTER — Other Ambulatory Visit (HOSPITAL_BASED_OUTPATIENT_CLINIC_OR_DEPARTMENT_OTHER): Payer: Self-pay

## 2021-09-12 MED ORDER — SYNTHROID 112 MCG PO TABS
ORAL_TABLET | ORAL | 4 refills | Status: DC
  Filled 2021-09-12: qty 90, 90d supply, fill #0
  Filled 2021-12-13: qty 90, 90d supply, fill #1
  Filled 2022-05-15: qty 90, 90d supply, fill #2

## 2021-09-13 ENCOUNTER — Other Ambulatory Visit (HOSPITAL_BASED_OUTPATIENT_CLINIC_OR_DEPARTMENT_OTHER): Payer: Self-pay

## 2021-09-14 ENCOUNTER — Other Ambulatory Visit: Payer: Self-pay | Admitting: Family Medicine

## 2021-09-14 ENCOUNTER — Other Ambulatory Visit (HOSPITAL_BASED_OUTPATIENT_CLINIC_OR_DEPARTMENT_OTHER): Payer: Self-pay

## 2021-09-14 MED ORDER — ESCITALOPRAM OXALATE 20 MG PO TABS
20.0000 mg | ORAL_TABLET | Freq: Every day | ORAL | 1 refills | Status: DC
  Filled 2021-09-14: qty 90, 90d supply, fill #0
  Filled 2021-12-13: qty 90, 90d supply, fill #1

## 2021-09-18 ENCOUNTER — Ambulatory Visit (INDEPENDENT_AMBULATORY_CARE_PROVIDER_SITE_OTHER): Payer: No Typology Code available for payment source | Admitting: Psychology

## 2021-09-18 DIAGNOSIS — F331 Major depressive disorder, recurrent, moderate: Secondary | ICD-10-CM

## 2021-09-24 ENCOUNTER — Encounter: Payer: Self-pay | Admitting: Family Medicine

## 2021-09-25 ENCOUNTER — Ambulatory Visit (INDEPENDENT_AMBULATORY_CARE_PROVIDER_SITE_OTHER): Payer: No Typology Code available for payment source | Admitting: Psychology

## 2021-09-25 DIAGNOSIS — F331 Major depressive disorder, recurrent, moderate: Secondary | ICD-10-CM

## 2021-10-02 ENCOUNTER — Ambulatory Visit: Payer: No Typology Code available for payment source | Admitting: Psychology

## 2021-10-09 ENCOUNTER — Ambulatory Visit (INDEPENDENT_AMBULATORY_CARE_PROVIDER_SITE_OTHER): Payer: No Typology Code available for payment source | Admitting: Psychology

## 2021-10-09 DIAGNOSIS — F331 Major depressive disorder, recurrent, moderate: Secondary | ICD-10-CM

## 2021-10-16 ENCOUNTER — Ambulatory Visit (INDEPENDENT_AMBULATORY_CARE_PROVIDER_SITE_OTHER): Payer: No Typology Code available for payment source | Admitting: Psychology

## 2021-10-16 DIAGNOSIS — F900 Attention-deficit hyperactivity disorder, predominantly inattentive type: Secondary | ICD-10-CM

## 2021-10-16 DIAGNOSIS — F321 Major depressive disorder, single episode, moderate: Secondary | ICD-10-CM | POA: Diagnosis not present

## 2021-10-16 DIAGNOSIS — F411 Generalized anxiety disorder: Secondary | ICD-10-CM | POA: Diagnosis not present

## 2021-10-16 NOTE — Progress Notes (Signed)
Progress Note Start: 10/16/2021 04:00 PM End: 10/16/2021 04:55 PM Diagnosis F90.0 (Attention-deficit/hyperactivity disorder, Predominately inattentive presentation) [n/a]  F41.1 (Generalized anxiety disorder) F33.1 (Major Depressive Disorder, single episode, moderate  Symptoms: autonomic hyperactivity (e.g., palpitations, shortness of breath, dry mouth, trouble swallowing, nausea, diarrhea). (Status: maintained) -- No Description Entered  Childhood history of Attention Deficit Disorder (ADD) that was either diagnosed or later concluded due to the symptoms of behavioral problems at school, impulsivity, temper outbursts, and lack of concentration. (Status: maintained) -- No Description Entered  Depressed or irritable mood. (Status: maintained) -- No Description Entered  Diminished interest in or enjoyment of activities. (Status: maintained) -- No Description Entered  Easily distracted and drawn from task at hand. (Status: maintained) -- No Description Entered  Excessive and/or unrealistic worry that is difficult to control occurring more days than not for at least 6 months about a number of events or activities. (Status: maintained) -- No Description Entered  Feelings of hopelessness, worthlessness, or inappropriate guilt. (Status: maintained) -- No Description Entered  Lack of energy. (Status: maintained) -- No Description Entered  Poor concentration and indecisiveness. (Status: maintained) -- No Description Entered  Restless and fidgety; unable to be sedentary for more than a short time. (Status: maintained) -- No Description Entered  Medication Status compliance  Safety none  If Suicidal or Homicidal State Action Taken: unspecified  Current Risk: low Medications unspecified Objectives Related Problem: Develop healthy interpersonal relationships that lead to the alleviation and help prevent the relapse of depression. Description: Increasingly verbalize hopeful and positive statements  regarding self, others, and the future. Target Date: 2022-05-31 Frequency: Weekly Modality: individual Progress: 0%  Related Problem: Develop healthy interpersonal relationships that lead to the alleviation and help prevent the relapse of depression. Description: Learn and implement conflict resolution skills to resolve interpersonal problems. Target Date: 2022-05-31 Frequency: Weekly Modality: individual Progress: 0%  Related Problem: Develop healthy interpersonal relationships that lead to the alleviation and help prevent the relapse of depression. Description: Verbalize an understanding and resolution of current interpersonal problems. Target Date: 2022-05-31 Frequency: Weekly Modality: individual Progress: 0%  Related Problem: Develop healthy interpersonal relationships that lead to the alleviation and help prevent the relapse of depression. Description: Learn and implement problem-solving and decision-making skills. Target Date: 2022-05-31 Frequency: Weekly Modality: individual Progress: 0% Planned Intervention: Encourage in the client the development of a positive problem orientation in which problems and solving them are viewed as a natural part of life and not something to be feared, despaired, or avoided.  Related Problem: Learn and implement coping skills that result in a reduction of anxiety and worry, and improved daily functioning. Description: Identify, challenge, and replace biased, fearful self-talk with positive, realistic, and empowering self-talk. Target Date: 2022-05-31 Frequency: Weekly Modality: individual Progress: 0%  Related Problem: Learn and implement coping skills that result in a reduction of anxiety and worry, and improved daily functioning. Description: Learn and implement problem-solving strategies for realistically addressing worries. Target Date: 2022-05-31 Frequency: Weekly Modality: individual Progress: 0%  Related Problem: Learn and  implement coping skills that result in a reduction of anxiety and worry, and improved daily functioning. Description: Identify and engage in pleasant activities on a daily basis. Target Date: 2022-05-31 Frequency: Weekly Modality: individual Progress: 0%  Related Problem: Learn and implement coping skills that result in a reduction of anxiety and worry, and improved daily functioning. Description: Learn and implement personal and interpersonal skills to reduce anxiety and improve interpersonal relationships. Target Date: 2022-05-31 Frequency: Weekly Modality: individual Progress: 0%  Related Problem: Learn and implement coping skills that result in a reduction of anxiety and worry, and improved daily functioning. Description: Maintain involvement in work, family, and social activities. Target Date: 2022-05-31 Frequency: Weekly Modality: individual Progress: 0%  Related Problem: Learn and implement coping skills that result in a reduction of anxiety and worry, and improved daily functioning. Description: Reestablish a consistent sleep-wake cycle. Target Date: 2022-05-31 Frequency: Weekly Modality: individual Progress: 0%  Related Problem: Learn and implement coping skills that result in a reduction of anxiety and worry, and improved daily functioning. Description: Verbalize an understanding of the role that cognitive biases play in excessive irrational worry and persistent anxiety symptoms. Target Date: 2022-05-31 Frequency: Weekly Modality: individual Progress: 0%  Related Problem: Learn and implement coping skills that result in a reduction of anxiety and worry, and improved daily functioning. Description: Complete a medical evaluation to assess for possible contribution of medical or substance-related conditions to the anxiety. Target Date: 2022-05-31 Frequency: Weekly Modality: individual Progress: 0%  Related Problem: Learn and implement coping skills that result in a  reduction of anxiety and worry, and improved daily functioning. Description: Cooperate with a medication evaluation by a physician. Target Date: 2022-05-31 Frequency: Weekly Modality: individual Progress: 0%  Related Problem: Learn and implement coping skills that result in a reduction of anxiety and worry, and improved daily functioning. Description: Learn and implement calming skills to reduce overall anxiety and manage anxiety symptoms. Target Date: 2022-05-31 Frequency: Weekly Modality: individual Progress: 0%  Related Problem: Minimize ADD behavioral interference in daily life. Description: Learn and implement organization and planning skills. Target Date: 2022-05-31 Frequency: Weekly Modality: individual Progress: 0% Planned Intervention: Teach the client organization and planning skills including the routine use of a calendar and daily task list.  Related Problem: Minimize ADD behavioral interference in daily life. Description: Learn and implement skills to reduce the disruptive influence of distractibility. Target Date: 2022-05-31 Frequency: Weekly Modality: individual Progress: 0%  Related Problem: Minimize ADD behavioral interference in daily life. Description: Combine skills learned in therapy into a new daily approach to managing ADHD. Target Date: 2022-05-31 Frequency: Weekly Modality: individual Progress: 0%  Related Problem: Minimize ADD behavioral interference in daily life. Description: Increase knowledge of ADHD and its treatment. Target Date: 2022-05-31 Frequency: Weekly Modality: individual Progress: 0%  Related Problem: Minimize ADD behavioral interference in daily life. Description: Cooperate with and complete a psychiatric evaluation. Target Date: 2022-05-31 Frequency: Weekly Modality: individual Progress: 0%  Related Problem: Minimize ADD behavioral interference in daily life. Description: Comply with all recommendations based on the  psychiatric and/or psychological evaluations. Target Date: 2022-05-31 Frequency: Weekly Modality: individual Progress: 0%  Related Problem: Minimize ADD behavioral interference in daily life. Description: Describe past and present experiences with ADD including its effects on functioning. Target Date: 2022-05-31 Frequency: Weekly Modality: individual Progress: 0%  Client Response full compliance  Service Location Location, [address] [place], [place] [address]  Service Code cpt [phone]  Organizational Skills, Time Management Skills  Identify automatic thoughts  Rationally challenge thoughts or beliefs/cognitive restructuring  Normalize/Reframe  Identify/label emotions  Validate/empathize  Assign therapy homework  Review therapy homework  Identified an insight  Lifestyle change (exercise, nutrition)  Comments    Today I met with  Olivia [name] in remote video (WebEx) face-to-face individual psychotherapy as an accomodation to the COVID-19 Pandemic.  Distance Site: [name]: Dr Jannifer Franklin Remote Office Consent: Obtained verbal consent to transmit  session remotely    Olivia [name] reports that  she focused on good sleep hygiene and found that she got more sleep this past week.  She also noted that she was able to get a lot of planning and small tasks accomplished.   We identified two tasks to accomplish for the week to improve nutrition - planning and prepping.   Home Practice: improve sleep hygiene   Progress: patient is making progress in learning and implementing new coping strategies to manage her mood states aeb use of simplified to do list, delegating tasks, using coping statements to address cognitive distortions

## 2021-10-16 NOTE — Progress Notes (Signed)
Progress Note Start: 10/16/2021 04:00 PM End: 10/16/2021 04:55 PM Diagnosis F90.0 (Attention-deficit/hyperactivity disorder, Predominately inattentive presentation) [n/a]  F41.1 (Generalized anxiety disorder) F32.0 (Major Depressive Disorder, single episode, moderate)  Symptoms Autonomic hyperactivity (e.g., palpitations, shortness of breath, dry mouth, trouble swallowing, nausea, diarrhea). (Status: maintained) -- No Description Entered  Childhood history of Attention Deficit Disorder (ADD) that was either diagnosed or later concluded due to the symptoms of behavioral problems at school, impulsivity, temper outbursts, and lack of concentration. (Status: maintained) -- No Description Entered  Depressed or irritable mood. (Status: maintained) -- No Description Entered  Diminished interest in or enjoyment of activities. (Status: maintained) -- No Description Entered  Easily distracted and drawn from task at hand. (Status: maintained) -- No Description Entered  Excessive and/or unrealistic worry that is difficult to control occurring more days than not for at least 6 months about a number of events or activities. (Status: maintained) -- No Description Entered  Feelings of hopelessness, worthlessness, or inappropriate guilt. (Status: maintained) -- No Description Entered  Lack of energy. (Status: maintained) -- No Description Entered  Poor concentration and indecisiveness. (Status: maintained) -- No Description Entered  Restless and fidgety; unable to be sedentary for more than a short time. (Status: maintained) -- No Description Entered  Medication Status compliance  Safety none  If Suicidal or Homicidal State Action Taken: unspecified  Current Risk: low Medications unspecified Objectives Related Problem: Develop healthy interpersonal relationships that lead to the alleviation and help prevent the relapse of depression. Description: Increasingly verbalize hopeful and positive statements  regarding self, others, and the future. Target Date: 2022-05-31 Frequency: Weekly Modality: individual Progress: 0%  Related Problem: Develop healthy interpersonal relationships that lead to the alleviation and help prevent the relapse of depression. Description: Learn and implement conflict resolution skills to resolve interpersonal problems. Target Date: 2022-05-31 Frequency: Weekly Modality: individual Progress: 0%  Related Problem: Develop healthy interpersonal relationships that lead to the alleviation and help prevent the relapse of depression. Description: Verbalize an understanding and resolution of current interpersonal problems. Target Date: 2022-05-31 Frequency: Weekly Modality: individual Progress: 0%  Related Problem: Develop healthy interpersonal relationships that lead to the alleviation and help prevent the relapse of depression. Description: Learn and implement problem-solving and decision-making skills. Target Date: 2022-05-31 Frequency: Weekly Modality: individual Progress: 0% Planned Intervention: Encourage in the client the development of a positive problem orientation in which problems and solving them are viewed as a natural part of life and not something to be feared, despaired, or avoided.  Related Problem: Learn and implement coping skills that result in a reduction of anxiety and worry, and improved daily functioning. Description: Identify, challenge, and replace biased, fearful self-talk with positive, realistic, and empowering self-talk. Target Date: 2022-05-31 Frequency: Weekly Modality: individual Progress: 0%  Related Problem: Learn and implement coping skills that result in a reduction of anxiety and worry, and improved daily functioning. Description: Learn and implement problem-solving strategies for realistically addressing worries. Target Date: 2022-05-31 Frequency: Weekly Modality: individual Progress: 0%  Related Problem: Learn and  implement coping skills that result in a reduction of anxiety and worry, and improved daily functioning. Description: Identify and engage in pleasant activities on a daily basis. Target Date: 2022-05-31 Frequency: Weekly Modality: individual Progress: 0%  Related Problem: Learn and implement coping skills that result in a reduction of anxiety and worry, and improved daily functioning. Description: Learn and implement personal and interpersonal skills to reduce anxiety and improve interpersonal relationships. Target Date: 2022-05-31 Frequency: Weekly Modality: individual Progress: 0%  Related Problem: Learn and implement coping skills that result in a reduction of anxiety and worry, and improved daily functioning. Description: Maintain involvement in work, family, and social activities. Target Date: 2022-05-31 Frequency: Weekly Modality: individual Progress: 0%  Related Problem: Learn and implement coping skills that result in a reduction of anxiety and worry, and improved daily functioning. Description: Reestablish a consistent sleep-wake cycle. Target Date: 2022-05-31 Frequency: Weekly Modality: individual Progress: 0%  Related Problem: Learn and implement coping skills that result in a reduction of anxiety and worry, and improved daily functioning. Description: Verbalize an understanding of the role that cognitive biases play in excessive irrational worry and persistent anxiety symptoms. Target Date: 2022-05-31 Frequency: Weekly Modality: individual Progress: 0%  Related Problem: Learn and implement coping skills that result in a reduction of anxiety and worry, and improved daily functioning. Description: Complete a medical evaluation to assess for possible contribution of medical or substance-related conditions to the anxiety. Target Date: 2022-05-31 Frequency: Weekly Modality: individual Progress: 0%  Related Problem: Learn and implement coping skills that result in a  reduction of anxiety and worry, and improved daily functioning. Description: Cooperate with a medication evaluation by a physician. Target Date: 2022-05-31 Frequency: Weekly Modality: individual Progress: 0%  Related Problem: Learn and implement coping skills that result in a reduction of anxiety and worry, and improved daily functioning. Description: Learn and implement calming skills to reduce overall anxiety and manage anxiety symptoms. Target Date: 2022-05-31 Frequency: Weekly Modality: individual Progress: 0%  Related Problem: Minimize ADD behavioral interference in daily life. Description: Learn and implement organization and planning skills. Target Date: 2022-05-31 Frequency: Weekly Modality: individual Progress: 0% Planned Intervention: Teach the client organization and planning skills including the routine use of a calendar and daily task list.  Related Problem: Minimize ADD behavioral interference in daily life. Description: Learn and implement skills to reduce the disruptive influence of distractibility. Target Date: 2022-05-31 Frequency: Weekly Modality: individual Progress: 0%  Related Problem: Minimize ADD behavioral interference in daily life. Description: Combine skills learned in therapy into a new daily approach to managing ADHD. Target Date: 2022-05-31 Frequency: Weekly Modality: individual Progress: 0%  Related Problem: Minimize ADD behavioral interference in daily life. Description: Increase knowledge of ADHD and its treatment. Target Date: 2022-05-31 Frequency: Weekly Modality: individual Progress: 0%  Related Problem: Minimize ADD behavioral interference in daily life. Description: Cooperate with and complete a psychiatric evaluation. Target Date: 2022-05-31 Frequency: Weekly Modality: individual Progress: 0%  Related Problem: Minimize ADD behavioral interference in daily life. Description: Comply with all recommendations based on the  psychiatric and/or psychological evaluations. Target Date: 2022-05-31 Frequency: Weekly Modality: individual Progress: 0%  Related Problem: Minimize ADD behavioral interference in daily life. Description: Describe past and present experiences with ADD including its effects on functioning. Target Date: 2022-05-31 Frequency: Weekly Modality: individual Progress: 0%  Client Response full compliance  Service Location Location, [address] [place], [place] [address]  Service Code cpt [phone]  Organizational Skills, Time Management Skills  Identify automatic thoughts  Rationally challenge thoughts or beliefs/cognitive restructuring  Normalize/Reframe  Identify/label emotions  Validate/empathize  Assign therapy homework  Review therapy homework  Identified an insight  Lifestyle change (exercise, nutrition)  Comments   Today I met with  [name] in remote video (WebEx) face-to-face individual psychotherapy as an accomodation to the COVID-19 Pandemic.  Distance Site: [name]: Dr Jannifer Franklin Remote Office Consent: Obtained verbal consent to transmit  session remotely   Olivia [name] reports that she focused  on sleep hygiene and got more quality sleep.  As a result she found she was able to plan and accomplish more task.  She states she was able to use her self talk skills to minimize negative self judgements.  In session we identified two tasks to focus on for the next two weeks - breakfast meal planning and prep.  I encouraged her to ask for assistance from her son and she agreed this was a good plan.      Progress: patient is making progress in learning and implementing new coping strategies to manage her mood states aeb use of simplified to do list, delegating tasks, using coping statements to address cognitive distortions

## 2021-10-23 ENCOUNTER — Ambulatory Visit: Payer: No Typology Code available for payment source | Admitting: Psychology

## 2021-10-30 ENCOUNTER — Ambulatory Visit: Payer: No Typology Code available for payment source | Admitting: Psychology

## 2021-11-02 ENCOUNTER — Ambulatory Visit: Payer: No Typology Code available for payment source | Admitting: Family Medicine

## 2021-11-06 ENCOUNTER — Other Ambulatory Visit: Payer: Self-pay

## 2021-11-06 ENCOUNTER — Encounter: Payer: Self-pay | Admitting: Family Medicine

## 2021-11-06 ENCOUNTER — Other Ambulatory Visit (HOSPITAL_COMMUNITY): Payer: Self-pay

## 2021-11-06 ENCOUNTER — Ambulatory Visit (INDEPENDENT_AMBULATORY_CARE_PROVIDER_SITE_OTHER): Payer: No Typology Code available for payment source | Admitting: Family Medicine

## 2021-11-06 ENCOUNTER — Ambulatory Visit: Payer: No Typology Code available for payment source | Admitting: Psychology

## 2021-11-06 VITALS — BP 124/82 | HR 88 | Temp 98.3°F | Ht 68.0 in | Wt 369.8 lb

## 2021-11-06 DIAGNOSIS — F339 Major depressive disorder, recurrent, unspecified: Secondary | ICD-10-CM | POA: Diagnosis not present

## 2021-11-06 DIAGNOSIS — Z730 Burn-out: Secondary | ICD-10-CM

## 2021-11-06 DIAGNOSIS — Z23 Encounter for immunization: Secondary | ICD-10-CM | POA: Diagnosis not present

## 2021-11-06 MED ORDER — BUPROPION HCL ER (XL) 150 MG PO TB24
150.0000 mg | ORAL_TABLET | Freq: Every day | ORAL | 3 refills | Status: DC
  Filled 2021-11-06 – 2021-11-16 (×2): qty 90, 90d supply, fill #0
  Filled 2022-02-13: qty 90, 90d supply, fill #1
  Filled 2022-02-14: qty 90, 90d supply, fill #0
  Filled 2022-05-20: qty 90, 90d supply, fill #1
  Filled 2022-08-19: qty 90, 90d supply, fill #2

## 2021-11-06 NOTE — Patient Instructions (Addendum)
Please return in 3 months for your annual complete physical; please come fasting.   Let me know if Healthy weight and wellness referral is needed OR if we will try to start medication for weight loss here.   Today you were given your Shingrinx #2 and your Prevnar 20  vaccinations.    If you have any questions or concerns, please don't hesitate to send me a message via MyChart or call the office at [phone]. Thank you for visiting with [place] today! It's our pleasure caring for you.

## 2021-11-06 NOTE — Progress Notes (Addendum)
Subjective  CC:  Chief Complaint  Patient presents with   Depression    HPI: Olivia [name] is a 56 y.o. female who presents to the office today to address the problems listed above in the chief complaint, mood problems. Olivia [name] is here for follow-up of her mood.  Fortunately, she is now seeing a psychotherapist weekly.  This has been very beneficial.  She has been diagnosed with extreme burnout.  They are working systematically to help decrease her workload and improve her overall wellbeing.  She is working on taking better care of herself.  Sleep has improved.  She has used trazodone some.  She is on Lexapro which has helped but motivation remains low.  She and her therapist to discuss the addition of Wellbutrin.  She is open to this.  No new red flag symptoms are present. Morbid obesity: This is a big concern of hers at this point.  Has low back pain and knee pain that is related to this.  Has had gastric surgery in the past.  We discussed her eating pattern.  She is not exercising due to her limitations from foot and knee pain. Second Shingrix due today.  Eligible for Prevnar 20. Depression screen [facility] 2/9 11/06/2021 03/14/2021 05/01/2020  Decreased Interest 2 3 3   Down, Depressed, Hopeless 2 3 3   PHQ - 2 Score 4 6 6   Altered sleeping 3 3 3   Tired, decreased energy 3 3 3   Change in appetite 2 2 3   Feeling bad or failure about yourself  3 3 3   Trouble concentrating 3 3 3   Moving slowly or fidgety/restless 0 0 3  Suicidal thoughts 0 0 0  PHQ-9 Score 18 20 24   Difficult doing work/chores Somewhat difficult Extremely dIfficult Somewhat difficult  Some recent data might be hidden   GAD 7 : Generalized Anxiety Score 08/18/2020  Nervous, Anxious, on Edge 3  Control/stop worrying 3  Worry too much - different things 3  Trouble relaxing 3  Restless 3  Easily annoyed or irritable 3  Afraid - awful might happen 3  Total GAD 7 Score 21  Anxiety Difficulty Extremely difficult     Assessment  1. Major depression, recurrent, chronic ([place])   2. Morbid obesity ([place])   3. Burn-out      Plan  Depression and burnout: Praised for seeing a therapist.  Think this will be beneficial.  We will add Wellbutrin 150 XL daily.  Continue Lexapro 10.  Follow-up 3 months Morbid obesity: Discussed options of care.  Would like to use a GLP-1 inhibitor if we can get it covered.  Can consider healthy weight and wellness referral as well.  However donated to her stress load given the time commitment that is needed for this.  She will discuss with her therapist tomorrow and let me know which avenue we think is best.  We will choose monjouroor Ozempic Reviewed concept of mood problems caused by biochemical imbalance of neurotransmitters and rationale for treatment with medications and therapy.  Counseling given: pt was instructed to contact office, on-call physician or crisis Hotline if symptoms worsen significantly. If patient develops any suicidal or homicidal thoughts, she is directed to the ER immediately.  Shingrix No. 2 given today.  She will return for Prevnar 20 since we are out today.  Or will give it to her at her next visit. Follow up: 3 months for complete physical No orders of the defined types were placed in this encounter.  Meds ordered  this encounter  Medications   buPROPion (WELLBUTRIN XL) 150 MG 24 hr tablet    Sig: Take 1 tablet (150 mg total) by mouth daily.    Dispense:  90 tablet    Refill:  3      I reviewed the patients updated PMH, FH, and SocHx.    Patient Active Problem List   Diagnosis Date Noted   Post-surgical hypothyroidism 01/17/2021    Priority: High   Major depression, recurrent, chronic ([place]) 11/17/2018    Priority: High   History of laparoscopic adjustable gastric banding 05/08/2012    Priority: High   Mixed hyperlipidemia 04/15/2008    Priority: High   Morbid obesity ([place]) 04/15/2008    Priority: High   Ventricular bigeminy 11/17/2018     Priority: Medium    Atopic dermatitis 08/19/2018    Priority: Medium    Elevated coronary artery calcium score, 90th percentile for age and gender 08/16/2018    Priority: Medium    Psoriasis 01/05/2018    Priority: Medium    IUD (intrauterine device) in place, [place] 12/07/2017    Priority: Medium    PCOS (polycystic ovarian syndrome) 10/25/2014    Priority: Medium    Migraine with aura 01/11/2014    Priority: Medium    Vitamin D deficiency 01/05/2018    Priority: Low   Allergic rhinitis 04/15/2008    Priority: Low   Allergic asthma 04/15/2008    Priority: Low   History of total thyroidectomy 01/17/2021   History of parathyroid surgery 2021 01/17/2021   Hyperparathyroidism, primary ([place]) 05/22/2020   Chronic low back pain with sciatica 01/05/2018   Current Meds  Medication Sig   albuterol (PROVENTIL HFA) 108 (90 Base) MCG/ACT inhaler Inhale 2 puffs into the lungs every 6 (six) hours as needed for wheezing or shortness of breath.   albuterol (PROVENTIL) (2.5 MG/3ML) 0.083% nebulizer solution Take 3 mLs (2.5 mg total) by nebulization every 6 (six) hours as needed for wheezing or shortness of breath.   buPROPion (WELLBUTRIN XL) 150 MG 24 hr tablet Take 1 tablet (150 mg total) by mouth daily.   cetirizine (ZYRTEC) 10 MG tablet Take 10 mg by mouth daily as needed for allergies.    diclofenac sodium (VOLTAREN) 1 % GEL Apply topically to affected area qid (Patient taking differently: Apply 1 application topically 4 (four) times daily as needed (pain).)   escitalopram (LEXAPRO) 20 MG tablet Take 1 tablet (20 mg total) by mouth daily.   ferrous sulfate 325 (65 FE) MG EC tablet Take 325 mg by mouth daily with breakfast.   levothyroxine (SYNTHROID) 112 MCG tablet Take 1 tablet (112 mcg total) by mouth in the morning on an empty stomach.   magnesium 30 MG tablet Take 30 mg by mouth daily.   Multiple Vitamin (MULITIVITAMIN WITH MINERALS) TABS Take 1 tablet by mouth daily.   mupirocin cream  (BACTROBAN) 2 % Apply 1 application topically 2 (two) times daily.   mupirocin cream (BACTROBAN) 2 % APPLY TOPICALLY TO AFFECTED AREA TWICE DAILY.   nitroGLYCERIN (NITRODUR - DOSED IN MG/24 HR) 0.2 mg/hr patch Apply 1/4 patch daily to tendon for tendonitis.   SYNTHROID 112 MCG tablet 1 tablet in the morning on an empty stomach Orally Once a day 90 days   SYNTHROID 125 MCG tablet Take 1 tablet (125 mcg total) by mouth daily in the morning on an empty stomach.   Vitamin D, Cholecalciferol, 50 MCG (2000 UT) CAPS Take 2,000 Units by mouth daily.  Allergies: Patient is allergic to cephalosporins, eggs or egg-derived products, penicillins, sulfa drugs cross reactors, adhesive [tape], and effexor [venlafaxine]. Family history:  Patient family history includes Colon cancer in her mother; Colon polyps in her mother; Diabetes in her mother; Hypertension in her father; Obesity in her father, mother, and sister. Social History   Socioeconomic History   Marital status: Married    Spouse name: Not on file   Number of children: Not on file   Years of education: Not on file   Highest education level: Not on file  Occupational History   Occupation: NP    Employer: [employer]  Tobacco Use   Smoking status: Never   Smokeless tobacco: Never  Vaping Use   Vaping Use: Never used  Substance and Sexual Activity   Alcohol use: No   Drug use: No   Sexual activity: Yes    Partners: Male    Birth control/protection: I.U.D.    Comment: Vasectomy in spouse  Other Topics Concern   Not on file  Social History Narrative   Not on file   Social Determinants of Health   Financial Resource Strain: Not on file  Food Insecurity: Not on file  Transportation Needs: Not on file  Physical Activity: Not on file  Stress: Not on file  Social Connections: Not on file     Review of Systems: Constitutional: Negative for fever malaise or anorexia Cardiovascular: negative for chest pain Respiratory: negative  for SOB or persistent cough Gastrointestinal: negative for abdominal pain  Objective  Vitals: BP 124/82    [name] 88    Temp 98.3 F (36.8 C) (Temporal)    Ht 5\' 8"  (1.727 m)    Wt (!) 369 lb 12.8 oz (167.7 kg)    SpO2 98%    BMI 56.23 kg/m  General: no acute distress, well appearing, no apparent distress, well groomed Psych:  Alert and oriented x 3,normal mood, behavior, speech, dress, and thought processes.    Commons side effects, risks, benefits, and alternatives for medications and treatment plan prescribed today were discussed, and the patient expressed understanding of the given instructions. Patient is instructed to call or message via MyChart if he/she has any questions or concerns regarding our treatment plan. No barriers to understanding were identified. We discussed Red Flag symptoms and signs in detail. Patient expressed understanding regarding what to do in case of urgent or emergency type symptoms.  Medication list was reconciled, printed and provided to the patient in AVS. Patient instructions and summary information was reviewed with the patient as documented in the AVS. This note was prepared with assistance of Dragon voice recognition software. Occasional wrong-word or sound-a-like substitutions may have occurred due to the inherent limitations of voice recognition software

## 2021-11-07 ENCOUNTER — Ambulatory Visit (INDEPENDENT_AMBULATORY_CARE_PROVIDER_SITE_OTHER): Payer: No Typology Code available for payment source | Admitting: Psychology

## 2021-11-07 DIAGNOSIS — F331 Major depressive disorder, recurrent, moderate: Secondary | ICD-10-CM

## 2021-11-07 NOTE — Progress Notes (Signed)
Progress Note Start: 11/07/2021 04:00 PM End: 11/07/2021 04:55 PM Diagnosis F90.0 (Attention-deficit/hyperactivity disorder, Predominately inattentive presentation) F41.1 (Generalized anxiety disorder) F33.1 (Major Depressive Disorder, single episode, moderate  Symptoms: autonomic hyperactivity (e.g., palpitations, shortness of breath, dry mouth, trouble swallowing, nausea, diarrhea). (Status: maintained) -- No Description Entered  Childhood history of Attention Deficit Disorder (ADD) that was either diagnosed or later concluded due to the symptoms of behavioral problems at school, impulsivity, temper outbursts, and lack of concentration. (Status: maintained) -- No Description Entered  Depressed or irritable mood. (Status: maintained) -- No Description Entered  Diminished interest in or enjoyment of activities. (Status: maintained) -- No Description Entered  Easily distracted and drawn from task at hand. (Status: maintained) -- No Description Entered  Excessive and/or unrealistic worry that is difficult to control occurring more days than not for at least 6 months about a number of events or activities. (Status: maintained) -- No Description Entered  Feelings of hopelessness, worthlessness, or inappropriate guilt. (Status: maintained) -- No Description Entered  Lack of energy. (Status: maintained) -- No Description Entered  Poor concentration and indecisiveness. (Status: maintained) -- No Description Entered  Restless and fidgety; unable to be sedentary for more than a short time. (Status: maintained) -- No Description Entered  Medication Status compliance  Safety none  If Suicidal or Homicidal State Action Taken: unspecified  Current Risk: low Medications unspecified Objectives Related Problem: Develop healthy interpersonal relationships that lead to the alleviation and help prevent the relapse of depression. Description: Increasingly verbalize hopeful and positive statements regarding  self, others, and the future. Target Date: 2022-05-31 Frequency: Weekly Modality: individual Progress: 0%  Related Problem: Develop healthy interpersonal relationships that lead to the alleviation and help prevent the relapse of depression. Description: Learn and implement conflict resolution skills to resolve interpersonal problems. Target Date: 2022-05-31 Frequency: Weekly Modality: individual Progress: 0%  Related Problem: Develop healthy interpersonal relationships that lead to the alleviation and help prevent the relapse of depression. Description: Verbalize an understanding and resolution of current interpersonal problems. Target Date: 2022-05-31 Frequency: Weekly Modality: individual Progress: 0%  Related Problem: Develop healthy interpersonal relationships that lead to the alleviation and help prevent the relapse of depression. Description: Learn and implement problem-solving and decision-making skills. Target Date: 2022-05-31 Frequency: Weekly Modality: individual Progress: 0% Planned Intervention: Encourage in the client the development of a positive problem orientation in which problems and solving them are viewed as a natural part of life and not something to be feared, despaired, or avoided.  Related Problem: Learn and implement coping skills that result in a reduction of anxiety and worry, and improved daily functioning. Description: Identify, challenge, and replace biased, fearful self-talk with positive, realistic, and empowering self-talk. Target Date: 2022-05-31 Frequency: Weekly Modality: individual Progress: 0%  Related Problem: Learn and implement coping skills that result in a reduction of anxiety and worry, and improved daily functioning. Description: Learn and implement problem-solving strategies for realistically addressing worries. Target Date: 2022-05-31 Frequency: Weekly Modality: individual Progress: 0%  Related Problem: Learn and implement coping  skills that result in a reduction of anxiety and worry, and improved daily functioning. Description: Identify and engage in pleasant activities on a daily basis. Target Date: 2022-05-31 Frequency: Weekly Modality: individual Progress: 0%  Related Problem: Learn and implement coping skills that result in a reduction of anxiety and worry, and improved daily functioning. Description: Learn and implement personal and interpersonal skills to reduce anxiety and improve interpersonal relationships. Target Date: 2022-05-31 Frequency: Weekly Modality: individual Progress: 0%  Related  Problem: Learn and implement coping skills that result in a reduction of anxiety and worry, and improved daily functioning. Description: Maintain involvement in work, family, and social activities. Target Date: 2022-05-31 Frequency: Weekly Modality: individual Progress: 0%  Related Problem: Learn and implement coping skills that result in a reduction of anxiety and worry, and improved daily functioning. Description: Reestablish a consistent sleep-wake cycle. Target Date: 2022-05-31 Frequency: Weekly Modality: individual Progress: 0%  Related Problem: Learn and implement coping skills that result in a reduction of anxiety and worry, and improved daily functioning. Description: Verbalize an understanding of the role that cognitive biases play in excessive irrational worry and persistent anxiety symptoms. Target Date: 2022-05-31 Frequency: Weekly Modality: individual Progress: 0%  Related Problem: Learn and implement coping skills that result in a reduction of anxiety and worry, and improved daily functioning. Description: Complete a medical evaluation to assess for possible contribution of medical or substance-related conditions to the anxiety. Target Date: 2022-05-31 Frequency: Weekly Modality: individual Progress: 0%  Related Problem: Learn and implement coping skills that result in a reduction of  anxiety and worry, and improved daily functioning. Description: Cooperate with a medication evaluation by a physician. Target Date: 2022-05-31 Frequency: Weekly Modality: individual Progress: 0%  Related Problem: Learn and implement coping skills that result in a reduction of anxiety and worry, and improved daily functioning. Description: Learn and implement calming skills to reduce overall anxiety and manage anxiety symptoms. Target Date: 2022-05-31 Frequency: Weekly Modality: individual Progress: 0%  Related Problem: Minimize ADD behavioral interference in daily life. Description: Learn and implement organization and planning skills. Target Date: 2022-05-31 Frequency: Weekly Modality: individual Progress: 0% Planned Intervention: Teach the client organization and planning skills including the routine use of a calendar and daily task list.  Related Problem: Minimize ADD behavioral interference in daily life. Description: Learn and implement skills to reduce the disruptive influence of distractibility. Target Date: 2022-05-31 Frequency: Weekly Modality: individual Progress: 0%  Related Problem: Minimize ADD behavioral interference in daily life. Description: Combine skills learned in therapy into a new daily approach to managing ADHD. Target Date: 2022-05-31 Frequency: Weekly Modality: individual Progress: 0%  Related Problem: Minimize ADD behavioral interference in daily life. Description: Increase knowledge of ADHD and its treatment. Target Date: 2022-05-31 Frequency: Weekly Modality: individual Progress: 0%  Related Problem: Minimize ADD behavioral interference in daily life. Description: Cooperate with and complete a psychiatric evaluation. Target Date: 2022-05-31 Frequency: Weekly Modality: individual Progress: 0%  Related Problem: Minimize ADD behavioral interference in daily life. Description: Comply with all recommendations based on the psychiatric and/or  psychological evaluations. Target Date: 2022-05-31 Frequency: Weekly Modality: individual Progress: 0%  Related Problem: Minimize ADD behavioral interference in daily life. Description: Describe past and present experiences with ADD including its effects on functioning. Target Date: 2022-05-31 Frequency: Weekly Modality: individual Progress: 0%  Client Response full compliance  Service Location Location, [address] [place], [place] [address]  Service Code cpt [phone]  Organizational Skills, Time Management Skills  Identify automatic thoughts  Rationally challenge thoughts or beliefs/cognitive restructuring  Normalize/Reframe  Identify/label emotions  Validate/empathize  Assign therapy homework  Review therapy homework  Identified an insight  Lifestyle change (exercise, nutrition)  Comments    Today I met with  Olivia [name] in remote video (WebEx) face-to-face individual psychotherapy as an accomodation to the COVID-19 Pandemic.  Distance Site: [name]: Dr Jannifer Franklin Remote Office Consent: Obtained verbal consent to transmit  session remotely    Olivia [name] reports that she  met with her PCP and d/ starting Wellbutrin as suggested.  Her PCP was pleased with the changes she has made.  We d/ lifestyle issues to support better health.   We debated whether she would start [facility]ness or  Functional Family Medicine and agreed to have her meet with Dr Dalbert Batman.  Olivia [name] shared difficulties she had with her adult daughter Olivia [name] over the Christmas holiday.  We d/e/p the events, her response and her daughter's reaction.  We d/e the events and made connections to the past.  We agreed that she has done what she could with this situation, but agreed to continue to p/ in upcoming sessions.  Home Practice: improve sleep hygiene, make appt with Dr Dalbert Batman, after dinner walks   Progress: patient is making progress in learning and implementing new coping  strategies to manage her mood states aeb use of simplified to do list, delegating tasks, using coping statements to address cognitive distortions  [name], Ph. D.

## 2021-11-13 ENCOUNTER — Ambulatory Visit (INDEPENDENT_AMBULATORY_CARE_PROVIDER_SITE_OTHER): Payer: No Typology Code available for payment source | Admitting: Psychology

## 2021-11-13 DIAGNOSIS — F331 Major depressive disorder, recurrent, moderate: Secondary | ICD-10-CM | POA: Diagnosis not present

## 2021-11-13 NOTE — Progress Notes (Signed)
Progress Note Start: 11/07/2021 04:00 PM End: 11/07/2021 04:55 PM Diagnosis F90.0 (Attention-deficit/hyperactivity disorder, Predominately inattentive presentation) F41.1 (Generalized anxiety disorder) F33.1 (Major Depressive Disorder, single episode, moderate  Symptoms: autonomic hyperactivity (e.g., palpitations, shortness of breath, dry mouth, trouble swallowing, nausea, diarrhea). (Status: maintained) -- No Description Entered  Childhood history of Attention Deficit Disorder (ADD) that was either diagnosed or later concluded due to the symptoms of behavioral problems at school, impulsivity, temper outbursts, and lack of concentration. (Status: maintained) -- No Description Entered  Depressed or irritable mood. (Status: maintained) -- No Description Entered  Diminished interest in or enjoyment of activities. (Status: maintained) -- No Description Entered  Easily distracted and drawn from task at hand. (Status: maintained) -- No Description Entered  Excessive and/or unrealistic worry that is difficult to control occurring more days than not for at least 6 months about a number of events or activities. (Status: maintained) -- No Description Entered  Feelings of hopelessness, worthlessness, or inappropriate guilt. (Status: maintained) -- No Description Entered  Lack of energy. (Status: maintained) -- No Description Entered  Poor concentration and indecisiveness. (Status: maintained) -- No Description Entered  Restless and fidgety; unable to be sedentary for more than a short time. (Status: maintained) -- No Description Entered  Medication Status compliance  Safety none  If Suicidal or Homicidal State Action Taken: unspecified  Current Risk: low Medications unspecified Objectives Related Problem: Develop healthy interpersonal relationships that lead to the alleviation and help prevent the relapse of depression. Description: Increasingly verbalize hopeful and positive statements regarding  self, others, and the future. Target Date: 2022-05-31 Frequency: Weekly Modality: individual Progress: 0%  Related Problem: Develop healthy interpersonal relationships that lead to the alleviation and help prevent the relapse of depression. Description: Learn and implement conflict resolution skills to resolve interpersonal problems. Target Date: 2022-05-31 Frequency: Weekly Modality: individual Progress: 0%  Related Problem: Develop healthy interpersonal relationships that lead to the alleviation and help prevent the relapse of depression. Description: Verbalize an understanding and resolution of current interpersonal problems. Target Date: 2022-05-31 Frequency: Weekly Modality: individual Progress: 0%  Related Problem: Develop healthy interpersonal relationships that lead to the alleviation and help prevent the relapse of depression. Description: Learn and implement problem-solving and decision-making skills. Target Date: 2022-05-31 Frequency: Weekly Modality: individual Progress: 0% Planned Intervention: Encourage in the client the development of a positive problem orientation in which problems and solving them are viewed as a natural part of life and not something to be feared, despaired, or avoided.  Related Problem: Learn and implement coping skills that result in a reduction of anxiety and worry, and improved daily functioning. Description: Identify, challenge, and replace biased, fearful self-talk with positive, realistic, and empowering self-talk. Target Date: 2022-05-31 Frequency: Weekly Modality: individual Progress: 0%  Related Problem: Learn and implement coping skills that result in a reduction of anxiety and worry, and improved daily functioning. Description: Learn and implement problem-solving strategies for realistically addressing worries. Target Date: 2022-05-31 Frequency: Weekly Modality: individual Progress: 0%  Related Problem: Learn and implement coping  skills that result in a reduction of anxiety and worry, and improved daily functioning. Description: Identify and engage in pleasant activities on a daily basis. Target Date: 2022-05-31 Frequency: Weekly Modality: individual Progress: 0%  Related Problem: Learn and implement coping skills that result in a reduction of anxiety and worry, and improved daily functioning. Description: Learn and implement personal and interpersonal skills to reduce anxiety and improve interpersonal relationships. Target Date: 2022-05-31 Frequency: Weekly Modality: individual Progress: 0%  Related  Problem: Learn and implement coping skills that result in a reduction of anxiety and worry, and improved daily functioning. Description: Maintain involvement in work, family, and social activities. Target Date: 2022-05-31 Frequency: Weekly Modality: individual Progress: 0%  Related Problem: Learn and implement coping skills that result in a reduction of anxiety and worry, and improved daily functioning. Description: Reestablish a consistent sleep-wake cycle. Target Date: 2022-05-31 Frequency: Weekly Modality: individual Progress: 0%  Related Problem: Learn and implement coping skills that result in a reduction of anxiety and worry, and improved daily functioning. Description: Verbalize an understanding of the role that cognitive biases play in excessive irrational worry and persistent anxiety symptoms. Target Date: 2022-05-31 Frequency: Weekly Modality: individual Progress: 0%  Related Problem: Learn and implement coping skills that result in a reduction of anxiety and worry, and improved daily functioning. Description: Complete a medical evaluation to assess for possible contribution of medical or substance-related conditions to the anxiety. Target Date: 2022-05-31 Frequency: Weekly Modality: individual Progress: 0%  Related Problem: Learn and implement coping skills that result in a reduction of  anxiety and worry, and improved daily functioning. Description: Cooperate with a medication evaluation by a physician. Target Date: 2022-05-31 Frequency: Weekly Modality: individual Progress: 0%  Related Problem: Learn and implement coping skills that result in a reduction of anxiety and worry, and improved daily functioning. Description: Learn and implement calming skills to reduce overall anxiety and manage anxiety symptoms. Target Date: 2022-05-31 Frequency: Weekly Modality: individual Progress: 0%  Related Problem: Minimize ADD behavioral interference in daily life. Description: Learn and implement organization and planning skills. Target Date: 2022-05-31 Frequency: Weekly Modality: individual Progress: 0% Planned Intervention: Teach the client organization and planning skills including the routine use of a calendar and daily task list.  Related Problem: Minimize ADD behavioral interference in daily life. Description: Learn and implement skills to reduce the disruptive influence of distractibility. Target Date: 2022-05-31 Frequency: Weekly Modality: individual Progress: 0%  Related Problem: Minimize ADD behavioral interference in daily life. Description: Combine skills learned in therapy into a new daily approach to managing ADHD. Target Date: 2022-05-31 Frequency: Weekly Modality: individual Progress: 0%  Related Problem: Minimize ADD behavioral interference in daily life. Description: Increase knowledge of ADHD and its treatment. Target Date: 2022-05-31 Frequency: Weekly Modality: individual Progress: 0%  Related Problem: Minimize ADD behavioral interference in daily life. Description: Cooperate with and complete a psychiatric evaluation. Target Date: 2022-05-31 Frequency: Weekly Modality: individual Progress: 0%  Related Problem: Minimize ADD behavioral interference in daily life. Description: Comply with all recommendations based on the psychiatric and/or  psychological evaluations. Target Date: 2022-05-31 Frequency: Weekly Modality: individual Progress: 0%  Related Problem: Minimize ADD behavioral interference in daily life. Description: Describe past and present experiences with ADD including its effects on functioning. Target Date: 2022-05-31 Frequency: Weekly Modality: individual Progress: 0%  Client Response full compliance  Service Location Location, [address] [place], [place] [address]  Service Code cpt [phone]  Organizational Skills, Time Management Skills  Identify automatic thoughts  Rationally challenge thoughts or beliefs/cognitive restructuring  Normalize/Reframe  Identify/label emotions  Validate/empathize  Assign therapy homework  Review therapy homework  Identified an insight  Lifestyle change (exercise, nutrition)  Comments    Today I met with  [name] in remote video (WebEx) face-to-face individual psychotherapy as an accomodation to the COVID-19 Pandemic.  Distance Site: [name]: Dr Jannifer Franklin Remote Office Consent: Obtained verbal consent to transmit  session remotely    [name] reports that she  was emotionally raw after our last session for days.  She continued to process her interactions with her daughter.  We d/e/p an upcoming event and how to respond.  [name] states that shared a moment of struggle she had with self loathing and was able to self correct by using positive self talk.  She had an ah ha moment when she realize how unrelenting she has been with the self verbal abuse.  We d/e/p that she is beginning to access feelings she had been burying all along.  We made some important connections between present struggles and the past.     Home Practice: improve sleep hygiene, make appt with Dr Dalbert Batman, after dinner walks  Progress: patient is making progress in learning and implementing new coping strategies to manage her mood states aeb use of simplified to do list,  delegating tasks, using coping statements to address cognitive distortions  [name], Ph. D.

## 2021-11-14 ENCOUNTER — Other Ambulatory Visit (HOSPITAL_COMMUNITY): Payer: Self-pay

## 2021-11-16 ENCOUNTER — Other Ambulatory Visit (HOSPITAL_BASED_OUTPATIENT_CLINIC_OR_DEPARTMENT_OTHER): Payer: Self-pay

## 2021-11-20 ENCOUNTER — Ambulatory Visit (INDEPENDENT_AMBULATORY_CARE_PROVIDER_SITE_OTHER): Payer: No Typology Code available for payment source | Admitting: Psychology

## 2021-11-20 DIAGNOSIS — F331 Major depressive disorder, recurrent, moderate: Secondary | ICD-10-CM

## 2021-11-20 NOTE — Progress Notes (Signed)
Progress Note  Start: 11/20/2021 04:00 PM End: 11/20/2021 04:55 PM Diagnosis F90.0 (Attention-deficit/hyperactivity disorder, Predominately inattentive presentation) F41.1 (Generalized anxiety disorder) F33.1 (Major Depressive Disorder, single episode, moderate  Symptoms: autonomic hyperactivity (e.g., palpitations, shortness of breath, dry mouth, trouble swallowing, nausea, diarrhea). (Status: maintained) -- No Description Entered  Childhood history of Attention Deficit Disorder (ADD) that was either diagnosed or later concluded due to the symptoms of behavioral problems at school, impulsivity, temper outbursts, and lack of concentration. (Status: maintained) -- No Description Entered  Depressed or irritable mood. (Status: maintained) -- No Description Entered  Diminished interest in or enjoyment of activities. (Status: maintained) -- No Description Entered  Easily distracted and drawn from task at hand. (Status: maintained) -- No Description Entered  Excessive and/or unrealistic worry that is difficult to control occurring more days than not for at least 6 months about a number of events or activities. (Status: maintained) -- No Description Entered  Feelings of hopelessness, worthlessness, or inappropriate guilt. (Status: maintained) -- No Description Entered  Lack of energy. (Status: maintained) -- No Description Entered  Poor concentration and indecisiveness. (Status: maintained) -- No Description Entered  Restless and fidgety; unable to be sedentary for more than a short time. (Status: maintained) -- No Description Entered  Medication Status compliance  Safety none  If Suicidal or Homicidal State Action Taken: unspecified  Current Risk: low Medications unspecified Objectives Related Problem: Develop healthy interpersonal relationships that lead to the alleviation and help prevent the relapse of depression. Description: Increasingly verbalize hopeful and positive statements regarding  self, others, and the future. Target Date: 2022-05-31 Frequency: Weekly Modality: individual Progress: 0%  Related Problem: Develop healthy interpersonal relationships that lead to the alleviation and help prevent the relapse of depression. Description: Learn and implement conflict resolution skills to resolve interpersonal problems. Target Date: 2022-05-31 Frequency: Weekly Modality: individual Progress: 0%  Related Problem: Develop healthy interpersonal relationships that lead to the alleviation and help prevent the relapse of depression. Description: Verbalize an understanding and resolution of current interpersonal problems. Target Date: 2022-05-31 Frequency: Weekly Modality: individual Progress: 0%  Related Problem: Develop healthy interpersonal relationships that lead to the alleviation and help prevent the relapse of depression. Description: Learn and implement problem-solving and decision-making skills. Target Date: 2022-05-31 Frequency: Weekly Modality: individual Progress: 0% Planned Intervention: Encourage in the client the development of a positive problem orientation in which problems and solving them are viewed as a natural part of life and not something to be feared, despaired, or avoided.  Related Problem: Learn and implement coping skills that result in a reduction of anxiety and worry, and improved daily functioning. Description: Identify, challenge, and replace biased, fearful self-talk with positive, realistic, and empowering self-talk. Target Date: 2022-05-31 Frequency: Weekly Modality: individual Progress: 0%  Related Problem: Learn and implement coping skills that result in a reduction of anxiety and worry, and improved daily functioning. Description: Learn and implement problem-solving strategies for realistically addressing worries. Target Date: 2022-05-31 Frequency: Weekly Modality: individual Progress: 0%  Related Problem: Learn and implement coping  skills that result in a reduction of anxiety and worry, and improved daily functioning. Description: Identify and engage in pleasant activities on a daily basis. Target Date: 2022-05-31 Frequency: Weekly Modality: individual Progress: 0%  Related Problem: Learn and implement coping skills that result in a reduction of anxiety and worry, and improved daily functioning. Description: Learn and implement personal and interpersonal skills to reduce anxiety and improve interpersonal relationships. Target Date: 2022-05-31 Frequency: Weekly Modality: individual Progress: 0%  Related Problem: Learn and implement coping skills that result in a reduction of anxiety and worry, and improved daily functioning. Description: Maintain involvement in work, family, and social activities. Target Date: 2022-05-31 Frequency: Weekly Modality: individual Progress: 0%  Related Problem: Learn and implement coping skills that result in a reduction of anxiety and worry, and improved daily functioning. Description: Reestablish a consistent sleep-wake cycle. Target Date: 2022-05-31 Frequency: Weekly Modality: individual Progress: 0%  Related Problem: Learn and implement coping skills that result in a reduction of anxiety and worry, and improved daily functioning. Description: Verbalize an understanding of the role that cognitive biases play in excessive irrational worry and persistent anxiety symptoms. Target Date: 2022-05-31 Frequency: Weekly Modality: individual Progress: 0%  Related Problem: Learn and implement coping skills that result in a reduction of anxiety and worry, and improved daily functioning. Description: Complete a medical evaluation to assess for possible contribution of medical or substance-related conditions to the anxiety. Target Date: 2022-05-31 Frequency: Weekly Modality: individual Progress: 0%  Related Problem: Learn and implement coping skills that result in a reduction of  anxiety and worry, and improved daily functioning. Description: Cooperate with a medication evaluation by a physician. Target Date: 2022-05-31 Frequency: Weekly Modality: individual Progress: 0%  Related Problem: Learn and implement coping skills that result in a reduction of anxiety and worry, and improved daily functioning. Description: Learn and implement calming skills to reduce overall anxiety and manage anxiety symptoms. Target Date: 2022-05-31 Frequency: Weekly Modality: individual Progress: 0%  Related Problem: Minimize ADD behavioral interference in daily life. Description: Learn and implement organization and planning skills. Target Date: 2022-05-31 Frequency: Weekly Modality: individual Progress: 0% Planned Intervention: Teach the client organization and planning skills including the routine use of a calendar and daily task list.  Related Problem: Minimize ADD behavioral interference in daily life. Description: Learn and implement skills to reduce the disruptive influence of distractibility. Target Date: 2022-05-31 Frequency: Weekly Modality: individual Progress: 0%  Related Problem: Minimize ADD behavioral interference in daily life. Description: Combine skills learned in therapy into a new daily approach to managing ADHD. Target Date: 2022-05-31 Frequency: Weekly Modality: individual Progress: 0%  Related Problem: Minimize ADD behavioral interference in daily life. Description: Increase knowledge of ADHD and its treatment. Target Date: 2022-05-31 Frequency: Weekly Modality: individual Progress: 0%  Related Problem: Minimize ADD behavioral interference in daily life. Description: Cooperate with and complete a psychiatric evaluation. Target Date: 2022-05-31 Frequency: Weekly Modality: individual Progress: 0%  Related Problem: Minimize ADD behavioral interference in daily life. Description: Comply with all recommendations based on the psychiatric and/or  psychological evaluations. Target Date: 2022-05-31 Frequency: Weekly Modality: individual Progress: 0%  Related Problem: Minimize ADD behavioral interference in daily life. Description: Describe past and present experiences with ADD including its effects on functioning. Target Date: 2022-05-31 Frequency: Weekly Modality: individual Progress: 0%  Client Response full compliance  Service Location Location, [address] [place], [place] [address]  Service Code cpt [phone]  Organizational Skills, Time Management Skills  Identify automatic thoughts  Rationally challenge thoughts or beliefs/cognitive restructuring  Normalize/Reframe  Identify/label emotions  Validate/empathize  Assign therapy homework  Review therapy homework  Identified an insight  Lifestyle change (exercise, nutrition)  Comments    Today I met with  [name] in remote video (WebEx) face-to-face individual psychotherapy as an accomodation to the COVID-19 Pandemic.  Distance Site: [name]: Dr Jannifer Franklin Remote Office Consent: Obtained verbal consent to transmit  session remotely    [name] reports that  she is aware of fighting a lot of resistance this week.  She has just started Wellbutrin and was experiencing some nausea and headache.  I gave her some direction to take the Wellbutrin and Lexapro with food one hour after her Synthroid.  We talked about not taking an all-or-nothing approach to tasks, not comparing herself to how things have been done in the past and instead tackling tasks by breaking them down into smaller steps.  Lastly, we d/ cueing, the postive and negative and how these cue impact her mood and production.     Home Practice: improve sleep hygiene, make appt with Dr Dalbert Batman, after dinner walks  Progress: patient is making progress in learning and implementing new coping strategies to manage her mood states aeb use of simplified to do list, delegating tasks, using  coping statements to address cognitive distortions   [name], PhD

## 2021-11-20 NOTE — Progress Notes (Signed)
° ° ° ° ° ° ° ° ° ° ° ° ° ° °  Valree Feild Ann Ameshia Pewitt, PhD °

## 2021-11-27 ENCOUNTER — Ambulatory Visit (INDEPENDENT_AMBULATORY_CARE_PROVIDER_SITE_OTHER): Payer: No Typology Code available for payment source | Admitting: Psychology

## 2021-11-27 DIAGNOSIS — F331 Major depressive disorder, recurrent, moderate: Secondary | ICD-10-CM

## 2021-11-27 NOTE — Progress Notes (Signed)
Progress Note  Start: 11/27/2021 04:00 PM End: 11/27/2021 04:55 PM Diagnosis F90.0 (Attention-deficit/hyperactivity disorder, Predominately inattentive presentation) F41.1 (Generalized anxiety disorder) F33.1 (Major Depressive Disorder, single episode, moderate  Symptoms: autonomic hyperactivity (e.g., palpitations, shortness of breath, dry mouth, trouble swallowing, nausea, diarrhea). (Status: maintained) -- No Description Entered  Childhood history of Attention Deficit Disorder (ADD) that was either diagnosed or later concluded due to the symptoms of behavioral problems at school, impulsivity, temper outbursts, and lack of concentration. (Status: maintained) -- No Description Entered  Depressed or irritable mood. (Status: maintained) -- No Description Entered  Diminished interest in or enjoyment of activities. (Status: maintained) -- No Description Entered  Easily distracted and drawn from task at hand. (Status: maintained) -- No Description Entered  Excessive and/or unrealistic worry that is difficult to control occurring more days than not for at least 6 months about a number of events or activities. (Status: maintained) -- No Description Entered  Feelings of hopelessness, worthlessness, or inappropriate guilt. (Status: maintained) -- No Description Entered  Lack of energy. (Status: maintained) -- No Description Entered  Poor concentration and indecisiveness. (Status: maintained) -- No Description Entered  Restless and fidgety; unable to be sedentary for more than a short time. (Status: maintained) -- No Description Entered  Medication Status compliance  Safety none  If Suicidal or Homicidal State Action Taken: unspecified  Current Risk: low Medications unspecified Objectives Related Problem: Develop healthy interpersonal relationships that lead to the alleviation and help prevent the relapse of depression. Description: Increasingly verbalize hopeful and positive statements regarding  self, others, and the future. Target Date: 2022-05-31 Frequency: Weekly Modality: individual Progress: 0%  Related Problem: Develop healthy interpersonal relationships that lead to the alleviation and help prevent the relapse of depression. Description: Learn and implement conflict resolution skills to resolve interpersonal problems. Target Date: 2022-05-31 Frequency: Weekly Modality: individual Progress: 0%  Related Problem: Develop healthy interpersonal relationships that lead to the alleviation and help prevent the relapse of depression. Description: Verbalize an understanding and resolution of current interpersonal problems. Target Date: 2022-05-31 Frequency: Weekly Modality: individual Progress: 0%  Related Problem: Develop healthy interpersonal relationships that lead to the alleviation and help prevent the relapse of depression. Description: Learn and implement problem-solving and decision-making skills. Target Date: 2022-05-31 Frequency: Weekly Modality: individual Progress: 0% Planned Intervention: Encourage in the client the development of a positive problem orientation in which problems and solving them are viewed as a natural part of life and not something to be feared, despaired, or avoided.  Related Problem: Learn and implement coping skills that result in a reduction of anxiety and worry, and improved daily functioning. Description: Identify, challenge, and replace biased, fearful self-talk with positive, realistic, and empowering self-talk. Target Date: 2022-05-31 Frequency: Weekly Modality: individual Progress: 0%  Related Problem: Learn and implement coping skills that result in a reduction of anxiety and worry, and improved daily functioning. Description: Learn and implement problem-solving strategies for realistically addressing worries. Target Date: 2022-05-31 Frequency: Weekly Modality: individual Progress: 0%  Related Problem: Learn and implement coping  skills that result in a reduction of anxiety and worry, and improved daily functioning. Description: Identify and engage in pleasant activities on a daily basis. Target Date: 2022-05-31 Frequency: Weekly Modality: individual Progress: 0%  Related Problem: Learn and implement coping skills that result in a reduction of anxiety and worry, and improved daily functioning. Description: Learn and implement personal and interpersonal skills to reduce anxiety and improve interpersonal relationships. Target Date: 2022-05-31 Frequency: Weekly Modality: individual Progress: 0%  Related Problem: Learn and implement coping skills that result in a reduction of anxiety and worry, and improved daily functioning. Description: Maintain involvement in work, family, and social activities. Target Date: 2022-05-31 Frequency: Weekly Modality: individual Progress: 0%  Related Problem: Learn and implement coping skills that result in a reduction of anxiety and worry, and improved daily functioning. Description: Reestablish a consistent sleep-wake cycle. Target Date: 2022-05-31 Frequency: Weekly Modality: individual Progress: 0%  Related Problem: Learn and implement coping skills that result in a reduction of anxiety and worry, and improved daily functioning. Description: Verbalize an understanding of the role that cognitive biases play in excessive irrational worry and persistent anxiety symptoms. Target Date: 2022-05-31 Frequency: Weekly Modality: individual Progress: 0%  Related Problem: Learn and implement coping skills that result in a reduction of anxiety and worry, and improved daily functioning. Description: Complete a medical evaluation to assess for possible contribution of medical or substance-related conditions to the anxiety. Target Date: 2022-05-31 Frequency: Weekly Modality: individual Progress: 0%  Related Problem: Learn and implement coping skills that result in a reduction of  anxiety and worry, and improved daily functioning. Description: Cooperate with a medication evaluation by a physician. Target Date: 2022-05-31 Frequency: Weekly Modality: individual Progress: 0%  Related Problem: Learn and implement coping skills that result in a reduction of anxiety and worry, and improved daily functioning. Description: Learn and implement calming skills to reduce overall anxiety and manage anxiety symptoms. Target Date: 2022-05-31 Frequency: Weekly Modality: individual Progress: 0%  Related Problem: Minimize ADD behavioral interference in daily life. Description: Learn and implement organization and planning skills. Target Date: 2022-05-31 Frequency: Weekly Modality: individual Progress: 0% Planned Intervention: Teach the client organization and planning skills including the routine use of a calendar and daily task list.  Related Problem: Minimize ADD behavioral interference in daily life. Description: Learn and implement skills to reduce the disruptive influence of distractibility. Target Date: 2022-05-31 Frequency: Weekly Modality: individual Progress: 0%  Related Problem: Minimize ADD behavioral interference in daily life. Description: Combine skills learned in therapy into a new daily approach to managing ADHD. Target Date: 2022-05-31 Frequency: Weekly Modality: individual Progress: 0%  Related Problem: Minimize ADD behavioral interference in daily life. Description: Increase knowledge of ADHD and its treatment. Target Date: 2022-05-31 Frequency: Weekly Modality: individual Progress: 0%  Related Problem: Minimize ADD behavioral interference in daily life. Description: Cooperate with and complete a psychiatric evaluation. Target Date: 2022-05-31 Frequency: Weekly Modality: individual Progress: 0%  Related Problem: Minimize ADD behavioral interference in daily life. Description: Comply with all recommendations based on the psychiatric and/or  psychological evaluations. Target Date: 2022-05-31 Frequency: Weekly Modality: individual Progress: 0%  Related Problem: Minimize ADD behavioral interference in daily life. Description: Describe past and present experiences with ADD including its effects on functioning. Target Date: 2022-05-31 Frequency: Weekly Modality: individual Progress: 0%  Client Response full compliance  Service Location Location, [address] [place], [place] [address]  Service Code cpt [phone]  Organizational Skills, Time Management Skills  Identify automatic thoughts  Rationally challenge thoughts or beliefs/cognitive restructuring  Normalize/Reframe  Identify/label emotions  Validate/empathize  Assign therapy homework  Review therapy homework  Identified an insight  Lifestyle change (exercise, nutrition)  Comments    Today I met with  [name] in remote video (WebEx) face-to-face individual psychotherapy as an accomodation to the COVID-19 Pandemic.  Distance Site: [name]: Dr Jannifer Franklin Remote Office Consent: Obtained verbal consent to transmit  session remotely    [name] reports that  she had a really good week.  She felt like the Wellbutrin has helped.  She was productive, slept better and had more energy to do what she needed to do.  [name] also states that she met with Dr Dalbert Batman to address multiple health issues which she feels haven't been well stabilized.  [name] is feeling hopeful for the first time in a very long time.   We d/e/p how much of her self identity is wrapped up in certain activities and the feelings she derives from these activities.  We d/ and she decided which tasks she would focus on this week and felt capable of accomplishing.    Home Practice:  add exercise, track mood daily  Progress: patient is making progress in learning and implementing new coping strategies to manage her mood states aeb use of simplified to do list, delegating tasks,  using coping statements to address cognitive distortions    [name], PhD

## 2021-12-04 ENCOUNTER — Ambulatory Visit (INDEPENDENT_AMBULATORY_CARE_PROVIDER_SITE_OTHER): Payer: No Typology Code available for payment source | Admitting: Psychology

## 2021-12-04 DIAGNOSIS — F411 Generalized anxiety disorder: Secondary | ICD-10-CM

## 2021-12-04 NOTE — Progress Notes (Signed)
Progress Note  Start: 12/04/2021 04:00 PM End: 12/04/2021 04:55 PM Diagnosis F90.0 (Attention-deficit/hyperactivity disorder, Predominately inattentive presentation) F41.1 (Generalized anxiety disorder) F33.1 (Major Depressive Disorder, single episode, moderate  Symptoms: autonomic hyperactivity (e.g., palpitations, shortness of breath, dry mouth, trouble swallowing, nausea, diarrhea). (Status: maintained) -- No Description Entered  Childhood history of Attention Deficit Disorder (ADD) that was either diagnosed or later concluded due to the symptoms of behavioral problems at school, impulsivity, temper outbursts, and lack of concentration. (Status: maintained) -- No Description Entered  Depressed or irritable mood. (Status: maintained) -- No Description Entered  Diminished interest in or enjoyment of activities. (Status: maintained) -- No Description Entered  Easily distracted and drawn from task at hand. (Status: maintained) -- No Description Entered  Excessive and/or unrealistic worry that is difficult to control occurring more days than not for at least 6 months about a number of events or activities. (Status: maintained) -- No Description Entered  Feelings of hopelessness, worthlessness, or inappropriate guilt. (Status: maintained) -- No Description Entered  Lack of energy. (Status: maintained) -- No Description Entered  Poor concentration and indecisiveness. (Status: maintained) -- No Description Entered  Restless and fidgety; unable to be sedentary for more than a short time. (Status: maintained) -- No Description Entered  Medication Status compliance  Safety none  If Suicidal or Homicidal State Action Taken: unspecified  Current Risk: low Medications unspecified Objectives Related Problem: Develop healthy interpersonal relationships that lead to the alleviation and help prevent the relapse of depression. Description: Increasingly verbalize hopeful and positive statements regarding  self, others, and the future. Target Date: 2022-05-31 Frequency: Weekly Modality: individual Progress: 0%  Related Problem: Develop healthy interpersonal relationships that lead to the alleviation and help prevent the relapse of depression. Description: Learn and implement conflict resolution skills to resolve interpersonal problems. Target Date: 2022-05-31 Frequency: Weekly Modality: individual Progress: 0%  Related Problem: Develop healthy interpersonal relationships that lead to the alleviation and help prevent the relapse of depression. Description: Verbalize an understanding and resolution of current interpersonal problems. Target Date: 2022-05-31 Frequency: Weekly Modality: individual Progress: 0%  Related Problem: Develop healthy interpersonal relationships that lead to the alleviation and help prevent the relapse of depression. Description: Learn and implement problem-solving and decision-making skills. Target Date: 2022-05-31 Frequency: Weekly Modality: individual Progress: 0% Planned Intervention: Encourage in the client the development of a positive problem orientation in which problems and solving them are viewed as a natural part of life and not something to be feared, despaired, or avoided.  Related Problem: Learn and implement coping skills that result in a reduction of anxiety and worry, and improved daily functioning. Description: Identify, challenge, and replace biased, fearful self-talk with positive, realistic, and empowering self-talk. Target Date: 2022-05-31 Frequency: Weekly Modality: individual Progress: 0%  Related Problem: Learn and implement coping skills that result in a reduction of anxiety and worry, and improved daily functioning. Description: Learn and implement problem-solving strategies for realistically addressing worries. Target Date: 2022-05-31 Frequency: Weekly Modality: individual Progress: 0%  Related Problem: Learn and implement coping  skills that result in a reduction of anxiety and worry, and improved daily functioning. Description: Identify and engage in pleasant activities on a daily basis. Target Date: 2022-05-31 Frequency: Weekly Modality: individual Progress: 0%  Related Problem: Learn and implement coping skills that result in a reduction of anxiety and worry, and improved daily functioning. Description: Learn and implement personal and interpersonal skills to reduce anxiety and improve interpersonal relationships. Target Date: 2022-05-31 Frequency: Weekly Modality: individual Progress: 0%  Related Problem: Learn and implement coping skills that result in a reduction of anxiety and worry, and improved daily functioning. Description: Maintain involvement in work, family, and social activities. Target Date: 2022-05-31 Frequency: Weekly Modality: individual Progress: 0%  Related Problem: Learn and implement coping skills that result in a reduction of anxiety and worry, and improved daily functioning. Description: Reestablish a consistent sleep-wake cycle. Target Date: 2022-05-31 Frequency: Weekly Modality: individual Progress: 0%  Related Problem: Learn and implement coping skills that result in a reduction of anxiety and worry, and improved daily functioning. Description: Verbalize an understanding of the role that cognitive biases play in excessive irrational worry and persistent anxiety symptoms. Target Date: 2022-05-31 Frequency: Weekly Modality: individual Progress: 0%  Related Problem: Learn and implement coping skills that result in a reduction of anxiety and worry, and improved daily functioning. Description: Complete a medical evaluation to assess for possible contribution of medical or substance-related conditions to the anxiety. Target Date: 2022-05-31 Frequency: Weekly Modality: individual Progress: 0%  Related Problem: Learn and implement coping skills that result in a reduction of  anxiety and worry, and improved daily functioning. Description: Cooperate with a medication evaluation by a physician. Target Date: 2022-05-31 Frequency: Weekly Modality: individual Progress: 0%  Related Problem: Learn and implement coping skills that result in a reduction of anxiety and worry, and improved daily functioning. Description: Learn and implement calming skills to reduce overall anxiety and manage anxiety symptoms. Target Date: 2022-05-31 Frequency: Weekly Modality: individual Progress: 0%  Related Problem: Minimize ADD behavioral interference in daily life. Description: Learn and implement organization and planning skills. Target Date: 2022-05-31 Frequency: Weekly Modality: individual Progress: 0% Planned Intervention: Teach the client organization and planning skills including the routine use of a calendar and daily task list.  Related Problem: Minimize ADD behavioral interference in daily life. Description: Learn and implement skills to reduce the disruptive influence of distractibility. Target Date: 2022-05-31 Frequency: Weekly Modality: individual Progress: 0%  Related Problem: Minimize ADD behavioral interference in daily life. Description: Combine skills learned in therapy into a new daily approach to managing ADHD. Target Date: 2022-05-31 Frequency: Weekly Modality: individual Progress: 0%  Related Problem: Minimize ADD behavioral interference in daily life. Description: Increase knowledge of ADHD and its treatment. Target Date: 2022-05-31 Frequency: Weekly Modality: individual Progress: 0%  Related Problem: Minimize ADD behavioral interference in daily life. Description: Cooperate with and complete a psychiatric evaluation. Target Date: 2022-05-31 Frequency: Weekly Modality: individual Progress: 0%  Related Problem: Minimize ADD behavioral interference in daily life. Description: Comply with all recommendations based on the psychiatric and/or  psychological evaluations. Target Date: 2022-05-31 Frequency: Weekly Modality: individual Progress: 0%  Related Problem: Minimize ADD behavioral interference in daily life. Description: Describe past and present experiences with ADD including its effects on functioning. Target Date: 2022-05-31 Frequency: Weekly Modality: individual Progress: 0%  Client Response full compliance  Service Location Location, [address] [place], [place] [address]  Service Code cpt [phone])  Organizational Skills, Time Management Skills  Identify automatic thoughts  Rationally challenge thoughts or beliefs/cognitive restructuring  Normalize/Reframe  Identify/label emotions  Validate/empathize  Assign therapy homework  Review therapy homework  Identified an insight  Lifestyle change (exercise, nutrition)  Comments    Today I met with  Olivia [name] in remote video (WebEx) face-to-face individual psychotherapy as an accomodation to the COVID-19 Pandemic.  Distance Site: [name]: Dr Jannifer Franklin Remote Office Consent: Obtained verbal consent to transmit  session remotely    We started  the session by reviewing homework.  Olivia [name] reports that she accomplished a lot this week and still feel relaxed.  She shared all the things she was able to accomplish but admitted that she is still saying negative things to herself when she doesn't "get everything done."    Olivia [name] has a son on the Autism Spectrum and he will be soon getting his driver's permit.  She is rather anxious about him driving as he has days during which he is focused and other days when he is scattered.  We d/ ways she can manage her anxiety by setting rules and putting in place other protocols to deal with safety concerns.   Home Practice: 3 Things a Day, exercise, track mood daily  Progress: patient is making progress in learning and implementing new coping strategies to manage her mood states aeb use of  simplified to do list, delegating tasks, using coping statements to address cognitive distortions   [name], PhD

## 2021-12-11 ENCOUNTER — Ambulatory Visit (INDEPENDENT_AMBULATORY_CARE_PROVIDER_SITE_OTHER): Payer: No Typology Code available for payment source | Admitting: Psychology

## 2021-12-11 DIAGNOSIS — F321 Major depressive disorder, single episode, moderate: Secondary | ICD-10-CM

## 2021-12-11 NOTE — Progress Notes (Signed)
Progress Note  Start: 12/04/2021 04:00 PM End: 12/04/2021 04:55 PM Diagnosis F90.0 (Attention-deficit/hyperactivity disorder, Predominately inattentive presentation) F41.1 (Generalized anxiety disorder) F33.1 (Major Depressive Disorder, single episode, moderate  Symptoms: autonomic hyperactivity (e.g., palpitations, shortness of breath, dry mouth, trouble swallowing, nausea, diarrhea). (Status: maintained) -- No Description Entered  Childhood history of Attention Deficit Disorder (ADD) that was either diagnosed or later concluded due to the symptoms of behavioral problems at school, impulsivity, temper outbursts, and lack of concentration. (Status: maintained) -- No Description Entered  Depressed or irritable mood. (Status: maintained) -- No Description Entered  Diminished interest in or enjoyment of activities. (Status: maintained) -- No Description Entered  Easily distracted and drawn from task at hand. (Status: maintained) -- No Description Entered  Excessive and/or unrealistic worry that is difficult to control occurring more days than not for at least 6 months about a number of events or activities. (Status: maintained) -- No Description Entered  Feelings of hopelessness, worthlessness, or inappropriate guilt. (Status: maintained) -- No Description Entered  Lack of energy. (Status: maintained) -- No Description Entered  Poor concentration and indecisiveness. (Status: maintained) -- No Description Entered  Restless and fidgety; unable to be sedentary for more than a short time. (Status: maintained) -- No Description Entered  Medication Status compliance  Safety none  If Suicidal or Homicidal State Action Taken: unspecified  Current Risk: low Medications unspecified Objectives Related Problem: Develop healthy interpersonal relationships that lead to the alleviation and help prevent the relapse of depression. Description: Increasingly verbalize hopeful and positive statements regarding  self, others, and the future. Target Date: 2022-05-31 Frequency: Weekly Modality: individual Progress: 0%  Related Problem: Develop healthy interpersonal relationships that lead to the alleviation and help prevent the relapse of depression. Description: Learn and implement conflict resolution skills to resolve interpersonal problems. Target Date: 2022-05-31 Frequency: Weekly Modality: individual Progress: 0%  Related Problem: Develop healthy interpersonal relationships that lead to the alleviation and help prevent the relapse of depression. Description: Verbalize an understanding and resolution of current interpersonal problems. Target Date: 2022-05-31 Frequency: Weekly Modality: individual Progress: 0%  Related Problem: Develop healthy interpersonal relationships that lead to the alleviation and help prevent the relapse of depression. Description: Learn and implement problem-solving and decision-making skills. Target Date: 2022-05-31 Frequency: Weekly Modality: individual Progress: 0% Planned Intervention: Encourage in the client the development of a positive problem orientation in which problems and solving them are viewed as a natural part of life and not something to be feared, despaired, or avoided.  Related Problem: Learn and implement coping skills that result in a reduction of anxiety and worry, and improved daily functioning. Description: Identify, challenge, and replace biased, fearful self-talk with positive, realistic, and empowering self-talk. Target Date: 2022-05-31 Frequency: Weekly Modality: individual Progress: 0%  Related Problem: Learn and implement coping skills that result in a reduction of anxiety and worry, and improved daily functioning. Description: Learn and implement problem-solving strategies for realistically addressing worries. Target Date: 2022-05-31 Frequency: Weekly Modality: individual Progress: 0%  Related Problem: Learn and implement coping  skills that result in a reduction of anxiety and worry, and improved daily functioning. Description: Identify and engage in pleasant activities on a daily basis. Target Date: 2022-05-31 Frequency: Weekly Modality: individual Progress: 0%  Related Problem: Learn and implement coping skills that result in a reduction of anxiety and worry, and improved daily functioning. Description: Learn and implement personal and interpersonal skills to reduce anxiety and improve interpersonal relationships. Target Date: 2022-05-31 Frequency: Weekly Modality: individual Progress: 0%  Related Problem: Learn and implement coping skills that result in a reduction of anxiety and worry, and improved daily functioning. Description: Maintain involvement in work, family, and social activities. Target Date: 2022-05-31 Frequency: Weekly Modality: individual Progress: 0%  Related Problem: Learn and implement coping skills that result in a reduction of anxiety and worry, and improved daily functioning. Description: Reestablish a consistent sleep-wake cycle. Target Date: 2022-05-31 Frequency: Weekly Modality: individual Progress: 0%  Related Problem: Learn and implement coping skills that result in a reduction of anxiety and worry, and improved daily functioning. Description: Verbalize an understanding of the role that cognitive biases play in excessive irrational worry and persistent anxiety symptoms. Target Date: 2022-05-31 Frequency: Weekly Modality: individual Progress: 0%  Related Problem: Learn and implement coping skills that result in a reduction of anxiety and worry, and improved daily functioning. Description: Complete a medical evaluation to assess for possible contribution of medical or substance-related conditions to the anxiety. Target Date: 2022-05-31 Frequency: Weekly Modality: individual Progress: 0%  Related Problem: Learn and implement coping skills that result in a reduction of  anxiety and worry, and improved daily functioning. Description: Cooperate with a medication evaluation by a physician. Target Date: 2022-05-31 Frequency: Weekly Modality: individual Progress: 0%  Related Problem: Learn and implement coping skills that result in a reduction of anxiety and worry, and improved daily functioning. Description: Learn and implement calming skills to reduce overall anxiety and manage anxiety symptoms. Target Date: 2022-05-31 Frequency: Weekly Modality: individual Progress: 0%  Related Problem: Minimize ADD behavioral interference in daily life. Description: Learn and implement organization and planning skills. Target Date: 2022-05-31 Frequency: Weekly Modality: individual Progress: 0% Planned Intervention: Teach the client organization and planning skills including the routine use of a calendar and daily task list.  Related Problem: Minimize ADD behavioral interference in daily life. Description: Learn and implement skills to reduce the disruptive influence of distractibility. Target Date: 2022-05-31 Frequency: Weekly Modality: individual Progress: 0%  Related Problem: Minimize ADD behavioral interference in daily life. Description: Combine skills learned in therapy into a new daily approach to managing ADHD. Target Date: 2022-05-31 Frequency: Weekly Modality: individual Progress: 0%  Related Problem: Minimize ADD behavioral interference in daily life. Description: Increase knowledge of ADHD and its treatment. Target Date: 2022-05-31 Frequency: Weekly Modality: individual Progress: 0%  Related Problem: Minimize ADD behavioral interference in daily life. Description: Cooperate with and complete a psychiatric evaluation. Target Date: 2022-05-31 Frequency: Weekly Modality: individual Progress: 0%  Related Problem: Minimize ADD behavioral interference in daily life. Description: Comply with all recommendations based on the psychiatric and/or  psychological evaluations. Target Date: 2022-05-31 Frequency: Weekly Modality: individual Progress: 0%  Related Problem: Minimize ADD behavioral interference in daily life. Description: Describe past and present experiences with ADD including its effects on functioning. Target Date: 2022-05-31 Frequency: Weekly Modality: individual Progress: 0%  Client Response full compliance  Service Location Location, [address] [place], [place] [address]  Service Code cpt [phone])  Organizational Skills, Time Management Skills  Identify automatic thoughts  Rationally challenge thoughts or beliefs/cognitive restructuring  Normalize/Reframe  Identify/label emotions  Validate/empathize  Assign therapy homework  Review therapy homework  Identified an insight  Lifestyle change (exercise, nutrition)  Comments    Today I met with  Olivia [name] in remote video (WebEx) face-to-face individual psychotherapy as an accomodation to the COVID-19 Pandemic.  Distance Site: [name]: Dr Jannifer Franklin Remote Office Consent: Obtained verbal consent to transmit  session remotely    We started  the session by reviewing homework.  Olivia [name] reports that she handled several issues that have lingered on her plate.  We d/e/p her response to doing hard things.  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  We d/e/p her feeling, reactions and how to positively reframe th experiences.    Home Practice: 3 Things a Day, exercise, track mood daily   Progress: patient is making progress in learning and implementing new coping strategies to manage her mood states aeb use of simplified to do list, delegating tasks, using coping statements to address cognitive distortions   Olivia Crochet, Olivia [name]               Olivia Crochet, Olivia [name]

## 2021-12-13 ENCOUNTER — Other Ambulatory Visit (HOSPITAL_BASED_OUTPATIENT_CLINIC_OR_DEPARTMENT_OTHER): Payer: Self-pay

## 2021-12-18 ENCOUNTER — Ambulatory Visit (INDEPENDENT_AMBULATORY_CARE_PROVIDER_SITE_OTHER): Payer: No Typology Code available for payment source | Admitting: Psychology

## 2021-12-18 DIAGNOSIS — F321 Major depressive disorder, single episode, moderate: Secondary | ICD-10-CM | POA: Diagnosis not present

## 2021-12-18 NOTE — Progress Notes (Signed)
Progress Note  Start: 12/18/2021 04:00 PM End: 12/18/2021 04:55 PM Diagnosis F90.0 (Attention-deficit/hyperactivity disorder, Predominately inattentive presentation) F41.1 (Generalized anxiety disorder) F33.1 (Major Depressive Disorder, single episode, moderate  Symptoms: autonomic hyperactivity (e.g., palpitations, shortness of breath, dry mouth, trouble swallowing, nausea, diarrhea). (Status: maintained) -- No Description Entered  Childhood history of Attention Deficit Disorder (ADD) that was either diagnosed or later concluded due to the symptoms of behavioral problems at school, impulsivity, temper outbursts, and lack of concentration. (Status: maintained) -- No Description Entered  Depressed or irritable mood. (Status: maintained) -- No Description Entered  Diminished interest in or enjoyment of activities. (Status: maintained) -- No Description Entered  Easily distracted and drawn from task at hand. (Status: maintained) -- No Description Entered  Excessive and/or unrealistic worry that is difficult to control occurring more days than not for at least 6 months about a number of events or activities. (Status: maintained) -- No Description Entered  Feelings of hopelessness, worthlessness, or inappropriate guilt. (Status: maintained) -- No Description Entered  Lack of energy. (Status: maintained) -- No Description Entered  Poor concentration and indecisiveness. (Status: maintained) -- No Description Entered  Restless and fidgety; unable to be sedentary for more than a short time. (Status: maintained) -- No Description Entered  Medication Status compliance  Safety none  If Suicidal or Homicidal State Action Taken: unspecified  Current Risk: low Medications unspecified Objectives Related Problem: Develop healthy interpersonal relationships that lead to the alleviation and help prevent the relapse of depression. Description: Increasingly verbalize hopeful and positive statements regarding  self, others, and the future. Target Date: 2022-05-31 Frequency: Weekly Modality: individual Progress: 0%  Related Problem: Develop healthy interpersonal relationships that lead to the alleviation and help prevent the relapse of depression. Description: Learn and implement conflict resolution skills to resolve interpersonal problems. Target Date: 2022-05-31 Frequency: Weekly Modality: individual Progress: 0%  Related Problem: Develop healthy interpersonal relationships that lead to the alleviation and help prevent the relapse of depression. Description: Verbalize an understanding and resolution of current interpersonal problems. Target Date: 2022-05-31 Frequency: Weekly Modality: individual Progress: 0%  Related Problem: Develop healthy interpersonal relationships that lead to the alleviation and help prevent the relapse of depression. Description: Learn and implement problem-solving and decision-making skills. Target Date: 2022-05-31 Frequency: Weekly Modality: individual Progress: 0% Planned Intervention: Encourage in the client the development of a positive problem orientation in which problems and solving them are viewed as a natural part of life and not something to be feared, despaired, or avoided.  Related Problem: Learn and implement coping skills that result in a reduction of anxiety and worry, and improved daily functioning. Description: Identify, challenge, and replace biased, fearful self-talk with positive, realistic, and empowering self-talk. Target Date: 2022-05-31 Frequency: Weekly Modality: individual Progress: 0%  Related Problem: Learn and implement coping skills that result in a reduction of anxiety and worry, and improved daily functioning. Description: Learn and implement problem-solving strategies for realistically addressing worries. Target Date: 2022-05-31 Frequency: Weekly Modality: individual Progress: 0%  Related Problem: Learn and implement coping  skills that result in a reduction of anxiety and worry, and improved daily functioning. Description: Identify and engage in pleasant activities on a daily basis. Target Date: 2022-05-31 Frequency: Weekly Modality: individual Progress: 0%  Related Problem: Learn and implement coping skills that result in a reduction of anxiety and worry, and improved daily functioning. Description: Learn and implement personal and interpersonal skills to reduce anxiety and improve interpersonal relationships. Target Date: 2022-05-31 Frequency: Weekly Modality: individual Progress: 0%  Related Problem: Learn and implement coping skills that result in a reduction of anxiety and worry, and improved daily functioning. Description: Maintain involvement in work, family, and social activities. Target Date: 2022-05-31 Frequency: Weekly Modality: individual Progress: 0%  Related Problem: Learn and implement coping skills that result in a reduction of anxiety and worry, and improved daily functioning. Description: Reestablish a consistent sleep-wake cycle. Target Date: 2022-05-31 Frequency: Weekly Modality: individual Progress: 0%  Related Problem: Learn and implement coping skills that result in a reduction of anxiety and worry, and improved daily functioning. Description: Verbalize an understanding of the role that cognitive biases play in excessive irrational worry and persistent anxiety symptoms. Target Date: 2022-05-31 Frequency: Weekly Modality: individual Progress: 0%  Related Problem: Learn and implement coping skills that result in a reduction of anxiety and worry, and improved daily functioning. Description: Complete a medical evaluation to assess for possible contribution of medical or substance-related conditions to the anxiety. Target Date: 2022-05-31 Frequency: Weekly Modality: individual Progress: 0%  Related Problem: Learn and implement coping skills that result in a reduction of  anxiety and worry, and improved daily functioning. Description: Cooperate with a medication evaluation by a physician. Target Date: 2022-05-31 Frequency: Weekly Modality: individual Progress: 0%  Related Problem: Learn and implement coping skills that result in a reduction of anxiety and worry, and improved daily functioning. Description: Learn and implement calming skills to reduce overall anxiety and manage anxiety symptoms. Target Date: 2022-05-31 Frequency: Weekly Modality: individual Progress: 0%  Related Problem: Minimize ADD behavioral interference in daily life. Description: Learn and implement organization and planning skills. Target Date: 2022-05-31 Frequency: Weekly Modality: individual Progress: 0% Planned Intervention: Teach the client organization and planning skills including the routine use of a calendar and daily task list.  Related Problem: Minimize ADD behavioral interference in daily life. Description: Learn and implement skills to reduce the disruptive influence of distractibility. Target Date: 2022-05-31 Frequency: Weekly Modality: individual Progress: 0%  Related Problem: Minimize ADD behavioral interference in daily life. Description: Combine skills learned in therapy into a new daily approach to managing ADHD. Target Date: 2022-05-31 Frequency: Weekly Modality: individual Progress: 0%  Related Problem: Minimize ADD behavioral interference in daily life. Description: Increase knowledge of ADHD and its treatment. Target Date: 2022-05-31 Frequency: Weekly Modality: individual Progress: 0%  Related Problem: Minimize ADD behavioral interference in daily life. Description: Cooperate with and complete a psychiatric evaluation. Target Date: 2022-05-31 Frequency: Weekly Modality: individual Progress: 0%  Related Problem: Minimize ADD behavioral interference in daily life. Description: Comply with all recommendations based on the psychiatric and/or  psychological evaluations. Target Date: 2022-05-31 Frequency: Weekly Modality: individual Progress: 0%  Related Problem: Minimize ADD behavioral interference in daily life. Description: Describe past and present experiences with ADD including its effects on functioning. Target Date: 2022-05-31 Frequency: Weekly Modality: individual Progress: 0%  Client Response full compliance  Service Location Location, [address] [place], [place] [address]  Service Code cpt [phone])  Organizational Skills, Time Management Skills  Identify automatic thoughts  Rationally challenge thoughts or beliefs/cognitive restructuring  Normalize/Reframe  Identify/label emotions  Validate/empathize  Assign therapy homework  Review therapy homework  Identified an insight  Lifestyle change (exercise, nutrition)  Comments    Today I met with  Olivia [name] in remote video (WebEx) face-to-face individual psychotherapy as an accomodation to the COVID-19 Pandemic.  Distance Site: [name]: Dr Jannifer Franklin Remote Office Consent: Obtained verbal consent to transmit  session remotely    We started  the session by reviewing homework.  Olivia [name] reports that she handled several issues related to work that left her feeling exhausted.  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  We were then able to p/s and create an action plan for how to address this employee.  By the end of the session Olivia [name] had a better understand for why she was putting this task off and felt more confident about how she would handle the situation.    Home Practice: 3 Things a Day, exercise, track mood daily   Progress: patient is making progress in learning and implementing new coping strategies to  manage her mood states aeb use of simplified to do list, delegating tasks, using coping statements to address cognitive distortions   Olivia Crochet, PhD

## 2021-12-25 ENCOUNTER — Ambulatory Visit (INDEPENDENT_AMBULATORY_CARE_PROVIDER_SITE_OTHER): Payer: No Typology Code available for payment source | Admitting: Psychology

## 2021-12-25 DIAGNOSIS — F411 Generalized anxiety disorder: Secondary | ICD-10-CM | POA: Diagnosis not present

## 2021-12-25 NOTE — Progress Notes (Signed)
Progress Note  Start: 12/25/2021 04:00 PM End: 12/25/2021 04:55 PM Diagnosis F90.0 (Attention-deficit/hyperactivity disorder, Predominately inattentive presentation) F41.1 (Generalized anxiety disorder) F33.1 (Major Depressive Disorder, single episode, moderate  Symptoms: autonomic hyperactivity (e.g., palpitations, shortness of breath, dry mouth, trouble swallowing, nausea, diarrhea). (Status: maintained) -- No Description Entered  Childhood history of Attention Deficit Disorder (ADD) that was either diagnosed or later concluded due to the symptoms of behavioral problems at school, impulsivity, temper outbursts, and lack of concentration. (Status: maintained) -- No Description Entered  Depressed or irritable mood. (Status: maintained) -- No Description Entered  Diminished interest in or enjoyment of activities. (Status: maintained) -- No Description Entered  Easily distracted and drawn from task at hand. (Status: maintained) -- No Description Entered  Excessive and/or unrealistic worry that is difficult to control occurring more days than not for at least 6 months about a number of events or activities. (Status: maintained) -- No Description Entered  Feelings of hopelessness, worthlessness, or inappropriate guilt. (Status: maintained) -- No Description Entered  Lack of energy. (Status: maintained) -- No Description Entered  Poor concentration and indecisiveness. (Status: maintained) -- No Description Entered  Restless and fidgety; unable to be sedentary for more than a short time. (Status: maintained) -- No Description Entered  Medication Status compliance  Safety none  If Suicidal or Homicidal State Action Taken: unspecified  Current Risk: low Medications unspecified Objectives Related Problem: Develop healthy interpersonal relationships that lead to the alleviation and help prevent the relapse of depression. Description: Increasingly verbalize hopeful and positive statements regarding  self, others, and the future. Target Date: 2022-05-31 Frequency: Weekly Modality: individual Progress: 0%  Related Problem: Develop healthy interpersonal relationships that lead to the alleviation and help prevent the relapse of depression. Description: Learn and implement conflict resolution skills to resolve interpersonal problems. Target Date: 2022-05-31 Frequency: Weekly Modality: individual Progress: 0%  Related Problem: Develop healthy interpersonal relationships that lead to the alleviation and help prevent the relapse of depression. Description: Verbalize an understanding and resolution of current interpersonal problems. Target Date: 2022-05-31 Frequency: Weekly Modality: individual Progress: 0%  Related Problem: Develop healthy interpersonal relationships that lead to the alleviation and help prevent the relapse of depression. Description: Learn and implement problem-solving and decision-making skills. Target Date: 2022-05-31 Frequency: Weekly Modality: individual Progress: 0% Planned Intervention: Encourage in the client the development of a positive problem orientation in which problems and solving them are viewed as a natural part of life and not something to be feared, despaired, or avoided.  Related Problem: Learn and implement coping skills that result in a reduction of anxiety and worry, and improved daily functioning. Description: Identify, challenge, and replace biased, fearful self-talk with positive, realistic, and empowering self-talk. Target Date: 2022-05-31 Frequency: Weekly Modality: individual Progress: 0%  Related Problem: Learn and implement coping skills that result in a reduction of anxiety and worry, and improved daily functioning. Description: Learn and implement problem-solving strategies for realistically addressing worries. Target Date: 2022-05-31 Frequency: Weekly Modality: individual Progress: 0%  Related Problem: Learn and implement coping  skills that result in a reduction of anxiety and worry, and improved daily functioning. Description: Identify and engage in pleasant activities on a daily basis. Target Date: 2022-05-31 Frequency: Weekly Modality: individual Progress: 0%  Related Problem: Learn and implement coping skills that result in a reduction of anxiety and worry, and improved daily functioning. Description: Learn and implement personal and interpersonal skills to reduce anxiety and improve interpersonal relationships. Target Date: 2022-05-31 Frequency: Weekly Modality: individual Progress: 0%  Related Problem: Learn and implement coping skills that result in a reduction of anxiety and worry, and improved daily functioning. Description: Maintain involvement in work, family, and social activities. Target Date: 2022-05-31 Frequency: Weekly Modality: individual Progress: 0%  Related Problem: Learn and implement coping skills that result in a reduction of anxiety and worry, and improved daily functioning. Description: Reestablish a consistent sleep-wake cycle. Target Date: 2022-05-31 Frequency: Weekly Modality: individual Progress: 0%  Related Problem: Learn and implement coping skills that result in a reduction of anxiety and worry, and improved daily functioning. Description: Verbalize an understanding of the role that cognitive biases play in excessive irrational worry and persistent anxiety symptoms. Target Date: 2022-05-31 Frequency: Weekly Modality: individual Progress: 0%  Related Problem: Learn and implement coping skills that result in a reduction of anxiety and worry, and improved daily functioning. Description: Complete a medical evaluation to assess for possible contribution of medical or substance-related conditions to the anxiety. Target Date: 2022-05-31 Frequency: Weekly Modality: individual Progress: 0%  Related Problem: Learn and implement coping skills that result in a reduction of  anxiety and worry, and improved daily functioning. Description: Cooperate with a medication evaluation by a physician. Target Date: 2022-05-31 Frequency: Weekly Modality: individual Progress: 0%  Related Problem: Learn and implement coping skills that result in a reduction of anxiety and worry, and improved daily functioning. Description: Learn and implement calming skills to reduce overall anxiety and manage anxiety symptoms. Target Date: 2022-05-31 Frequency: Weekly Modality: individual Progress: 0%  Related Problem: Minimize ADD behavioral interference in daily life. Description: Learn and implement organization and planning skills. Target Date: 2022-05-31 Frequency: Weekly Modality: individual Progress: 0% Planned Intervention: Teach the client organization and planning skills including the routine use of a calendar and daily task list.  Related Problem: Minimize ADD behavioral interference in daily life. Description: Learn and implement skills to reduce the disruptive influence of distractibility. Target Date: 2022-05-31 Frequency: Weekly Modality: individual Progress: 0%  Related Problem: Minimize ADD behavioral interference in daily life. Description: Combine skills learned in therapy into a new daily approach to managing ADHD. Target Date: 2022-05-31 Frequency: Weekly Modality: individual Progress: 0%  Related Problem: Minimize ADD behavioral interference in daily life. Description: Increase knowledge of ADHD and its treatment. Target Date: 2022-05-31 Frequency: Weekly Modality: individual Progress: 0%  Related Problem: Minimize ADD behavioral interference in daily life. Description: Cooperate with and complete a psychiatric evaluation. Target Date: 2022-05-31 Frequency: Weekly Modality: individual Progress: 0%  Related Problem: Minimize ADD behavioral interference in daily life. Description: Comply with all recommendations based on the psychiatric and/or  psychological evaluations. Target Date: 2022-05-31 Frequency: Weekly Modality: individual Progress: 0%  Related Problem: Minimize ADD behavioral interference in daily life. Description: Describe past and present experiences with ADD including its effects on functioning. Target Date: 2022-05-31 Frequency: Weekly Modality: individual Progress: 0%  Client Response full compliance  Service Location Location, [address] [place], [place] [address]  Service Code cpt [id number] [phone])  Organizational Skills, Time Management Skills  Identify automatic thoughts  Rationally challenge thoughts or beliefs/cognitive restructuring  Normalize/Reframe  Identify/label emotions  Validate/empathize  Assign therapy homework  Review therapy homework  Identified an insight  Lifestyle change (exercise, nutrition)  Comments    Today I met with  Olivia [name] in remote video (WebEx) face-to-face individual psychotherapy as an accomodation to the COVID-19 Pandemic.  Distance Site: [name]: Dr Jannifer Franklin Remote Office Consent: Obtained verbal consent to transmit  session remotely   Olivia [name] reports that  her son was sick all this past week and she she and her husband were not feeling well either.  She didn't get any of the things she wanted to get done accomplished.  Olivia [name] was however able to see some progress in that she didn't completely berate herself for not getting more done.    As we talked, it became clearer that she was fighting a lot of anxiety about returning to a professional organization she had to step back during this difficult period in her life.  We d/ and rehearsed how she might respond to peoples' inquiries.     Home Practice: 3 Things a Day, exercise, track mood daily   Progress: patient is making progress in learning and implementing new coping strategies to manage her mood states aeb use of simplified to do list, delegating tasks, using coping statements to  address cognitive distortions   Olivia Crochet, PhD

## 2022-01-01 ENCOUNTER — Ambulatory Visit: Payer: No Typology Code available for payment source | Admitting: Psychology

## 2022-01-02 ENCOUNTER — Ambulatory Visit (INDEPENDENT_AMBULATORY_CARE_PROVIDER_SITE_OTHER): Payer: No Typology Code available for payment source | Admitting: Psychology

## 2022-01-02 DIAGNOSIS — F321 Major depressive disorder, single episode, moderate: Secondary | ICD-10-CM | POA: Diagnosis not present

## 2022-01-02 NOTE — Progress Notes (Signed)
Progress Note  Start: 12/25/2021 04:00 PM End: 12/25/2021 04:55 PM Diagnosis F90.0 (Attention-deficit/hyperactivity disorder, Predominately inattentive presentation) F41.1 (Generalized anxiety disorder) F33.1 (Major Depressive Disorder, single episode, moderate  Symptoms: autonomic hyperactivity (e.g., palpitations, shortness of breath, dry mouth, trouble swallowing, nausea, diarrhea). (Status: maintained) -- No Description Entered  Childhood history of Attention Deficit Disorder (ADD) that was either diagnosed or later concluded due to the symptoms of behavioral problems at school, impulsivity, temper outbursts, and lack of concentration. (Status: maintained) -- No Description Entered  Depressed or irritable mood. (Status: maintained) -- No Description Entered  Diminished interest in or enjoyment of activities. (Status: maintained) -- No Description Entered  Easily distracted and drawn from task at hand. (Status: maintained) -- No Description Entered  Excessive and/or unrealistic worry that is difficult to control occurring more days than not for at least 6 months about a number of events or activities. (Status: maintained) -- No Description Entered  Feelings of hopelessness, worthlessness, or inappropriate guilt. (Status: maintained) -- No Description Entered  Lack of energy. (Status: maintained) -- No Description Entered  Poor concentration and indecisiveness. (Status: maintained) -- No Description Entered  Restless and fidgety; unable to be sedentary for more than a short time. (Status: maintained) -- No Description Entered  Medication Status compliance  Safety none  If Suicidal or Homicidal State Action Taken: unspecified  Current Risk: low Medications unspecified Objectives Related Problem: Develop healthy interpersonal relationships that lead to the alleviation and help prevent the relapse of depression. Description: Increasingly verbalize hopeful and positive statements regarding  self, others, and the future. Target Date: 2022-05-31 Frequency: Weekly Modality: individual Progress: 0%  Related Problem: Develop healthy interpersonal relationships that lead to the alleviation and help prevent the relapse of depression. Description: Learn and implement conflict resolution skills to resolve interpersonal problems. Target Date: 2022-05-31 Frequency: Weekly Modality: individual Progress: 0%  Related Problem: Develop healthy interpersonal relationships that lead to the alleviation and help prevent the relapse of depression. Description: Verbalize an understanding and resolution of current interpersonal problems. Target Date: 2022-05-31 Frequency: Weekly Modality: individual Progress: 0%  Related Problem: Develop healthy interpersonal relationships that lead to the alleviation and help prevent the relapse of depression. Description: Learn and implement problem-solving and decision-making skills. Target Date: 2022-05-31 Frequency: Weekly Modality: individual Progress: 0% Planned Intervention: Encourage in the client the development of a positive problem orientation in which problems and solving them are viewed as a natural part of life and not something to be feared, despaired, or avoided.  Related Problem: Learn and implement coping skills that result in a reduction of anxiety and worry, and improved daily functioning. Description: Identify, challenge, and replace biased, fearful self-talk with positive, realistic, and empowering self-talk. Target Date: 2022-05-31 Frequency: Weekly Modality: individual Progress: 0%  Related Problem: Learn and implement coping skills that result in a reduction of anxiety and worry, and improved daily functioning. Description: Learn and implement problem-solving strategies for realistically addressing worries. Target Date: 2022-05-31 Frequency: Weekly Modality: individual Progress: 0%  Related Problem: Learn and implement coping  skills that result in a reduction of anxiety and worry, and improved daily functioning. Description: Identify and engage in pleasant activities on a daily basis. Target Date: 2022-05-31 Frequency: Weekly Modality: individual Progress: 0%  Related Problem: Learn and implement coping skills that result in a reduction of anxiety and worry, and improved daily functioning. Description: Learn and implement personal and interpersonal skills to reduce anxiety and improve interpersonal relationships. Target Date: 2022-05-31 Frequency: Weekly Modality: individual Progress: 0%  Related Problem: Learn and implement coping skills that result in a reduction of anxiety and worry, and improved daily functioning. Description: Maintain involvement in work, family, and social activities. Target Date: 2022-05-31 Frequency: Weekly Modality: individual Progress: 0%  Related Problem: Learn and implement coping skills that result in a reduction of anxiety and worry, and improved daily functioning. Description: Reestablish a consistent sleep-wake cycle. Target Date: 2022-05-31 Frequency: Weekly Modality: individual Progress: 0%  Related Problem: Learn and implement coping skills that result in a reduction of anxiety and worry, and improved daily functioning. Description: Verbalize an understanding of the role that cognitive biases play in excessive irrational worry and persistent anxiety symptoms. Target Date: 2022-05-31 Frequency: Weekly Modality: individual Progress: 0%  Related Problem: Learn and implement coping skills that result in a reduction of anxiety and worry, and improved daily functioning. Description: Complete a medical evaluation to assess for possible contribution of medical or substance-related conditions to the anxiety. Target Date: 2022-05-31 Frequency: Weekly Modality: individual Progress: 0%  Related Problem: Learn and implement coping skills that result in a reduction of  anxiety and worry, and improved daily functioning. Description: Cooperate with a medication evaluation by a physician. Target Date: 2022-05-31 Frequency: Weekly Modality: individual Progress: 0%  Related Problem: Learn and implement coping skills that result in a reduction of anxiety and worry, and improved daily functioning. Description: Learn and implement calming skills to reduce overall anxiety and manage anxiety symptoms. Target Date: 2022-05-31 Frequency: Weekly Modality: individual Progress: 0%  Related Problem: Minimize ADD behavioral interference in daily life. Description: Learn and implement organization and planning skills. Target Date: 2022-05-31 Frequency: Weekly Modality: individual Progress: 0% Planned Intervention: Teach the client organization and planning skills including the routine use of a calendar and daily task list.  Related Problem: Minimize ADD behavioral interference in daily life. Description: Learn and implement skills to reduce the disruptive influence of distractibility. Target Date: 2022-05-31 Frequency: Weekly Modality: individual Progress: 0%  Related Problem: Minimize ADD behavioral interference in daily life. Description: Combine skills learned in therapy into a new daily approach to managing ADHD. Target Date: 2022-05-31 Frequency: Weekly Modality: individual Progress: 0%  Related Problem: Minimize ADD behavioral interference in daily life. Description: Increase knowledge of ADHD and its treatment. Target Date: 2022-05-31 Frequency: Weekly Modality: individual Progress: 0%  Related Problem: Minimize ADD behavioral interference in daily life. Description: Cooperate with and complete a psychiatric evaluation. Target Date: 2022-05-31 Frequency: Weekly Modality: individual Progress: 0%  Related Problem: Minimize ADD behavioral interference in daily life. Description: Comply with all recommendations based on the psychiatric and/or  psychological evaluations. Target Date: 2022-05-31 Frequency: Weekly Modality: individual Progress: 0%  Related Problem: Minimize ADD behavioral interference in daily life. Description: Describe past and present experiences with ADD including its effects on functioning. Target Date: 2022-05-31 Frequency: Weekly Modality: individual Progress: 0%  Client Response full compliance  Service Location Location, [address] [place], [place] [address]  Service Code cpt [id number] [phone])  Organizational Skills, Time Management Skills  Identify automatic thoughts  Rationally challenge thoughts or beliefs/cognitive restructuring  Normalize/Reframe  Identify/label emotions  Validate/empathize  Assign therapy homework  Review therapy homework  Identified an insight  Lifestyle change (exercise, nutrition)  Comments    Today I met with  Olivia [name] in remote video (WebEx) face-to-face individual psychotherapy as an accomodation to the COVID-19 Pandemic.  Distance Site: [name]: Olivia [name] Remote Office Consent: Obtained verbal consent to transmit  session remotely   Olivia [name] reports that  she started some new medications and she was having a bad response.  Later she stated that she had a fall at work and cut open her forehead.  I encouraged her to meet with her PCP and assess whether she migh have a concussion.  We d/ her assessment with Olivia [name] and that she felt like she was well attended and had a thorough evaluation.  Olivia [name] states she was able to begin a difficult conversation with an employee we d/ previously.  She shared how things went, what was challenging and where she felt she did well.  Lastly, we d/ next steps related to completing conversation and it implications for her workload.   Home Practice: 3 Things a Day, exercise, track mood daily   Progress: patient is making progress in learning and implementing new coping strategies to manage her mood  states aeb use of simplified to do list, delegating tasks, using coping statements to address cognitive distortions   [name], PhD                [name], PhD

## 2022-01-08 ENCOUNTER — Ambulatory Visit: Payer: No Typology Code available for payment source | Admitting: Psychology

## 2022-01-08 DIAGNOSIS — F411 Generalized anxiety disorder: Secondary | ICD-10-CM

## 2022-01-08 NOTE — Progress Notes (Incomplete)
Progress Note  Start: 01/08/2022 04:00 PM End: 01/08/2022 04:55 PM Diagnosis F90.0 (Attention-deficit/hyperactivity disorder, Predominately inattentive presentation) F41.1 (Generalized anxiety disorder) F33.1 (Major Depressive Disorder, single episode, moderate  Symptoms: autonomic hyperactivity (e.g., palpitations, shortness of breath, dry mouth, trouble swallowing, nausea, diarrhea). (Status: maintained) -- No Description Entered  Childhood history of Attention Deficit Disorder (ADD) that was either diagnosed or later concluded due to the symptoms of behavioral problems at school, impulsivity, temper outbursts, and lack of concentration. (Status: maintained) -- No Description Entered  Depressed or irritable mood. (Status: maintained) -- No Description Entered  Diminished interest in or enjoyment of activities. (Status: maintained) -- No Description Entered  Easily distracted and drawn from task at hand. (Status: maintained) -- No Description Entered  Excessive and/or unrealistic worry that is difficult to control occurring more days than not for at least 6 months about a number of events or activities. (Status: maintained) -- No Description Entered  Feelings of hopelessness, worthlessness, or inappropriate guilt. (Status: maintained) -- No Description Entered  Lack of energy. (Status: maintained) -- No Description Entered  Poor concentration and indecisiveness. (Status: maintained) -- No Description Entered  Restless and fidgety; unable to be sedentary for more than a short time. (Status: maintained) -- No Description Entered  Medication Status compliance  Safety none  If Suicidal or Homicidal State Action Taken: unspecified  Current Risk: low Medications unspecified Objectives Related Problem: Develop healthy interpersonal relationships that lead to the alleviation and help prevent the relapse of depression. Description: Increasingly verbalize hopeful and positive statements  regarding self, others, and the future. Target Date: 2022-05-31 Frequency: Weekly Modality: individual Progress: 0%  Related Problem: Develop healthy interpersonal relationships that lead to the alleviation and help prevent the relapse of depression. Description: Learn and implement conflict resolution skills to resolve interpersonal problems. Target Date: 2022-05-31 Frequency: Weekly Modality: individual Progress: 0%  Related Problem: Develop healthy interpersonal relationships that lead to the alleviation and help prevent the relapse of depression. Description: Verbalize an understanding and resolution of current interpersonal problems. Target Date: 2022-05-31 Frequency: Weekly Modality: individual Progress: 0%  Related Problem: Develop healthy interpersonal relationships that lead to the alleviation and help prevent the relapse of depression. Description: Learn and implement problem-solving and decision-making skills. Target Date: 2022-05-31 Frequency: Weekly Modality: individual Progress: 0% Planned Intervention: Encourage in the client the development of a positive problem orientation in which problems and solving them are viewed as a natural part of life and not something to be feared, despaired, or avoided.  Related Problem: Learn and implement coping skills that result in a reduction of anxiety and worry, and improved daily functioning. Description: Identify, challenge, and replace biased, fearful self-talk with positive, realistic, and empowering self-talk. Target Date: 2022-05-31 Frequency: Weekly Modality: individual Progress: 0%  Related Problem: Learn and implement coping skills that result in a reduction of anxiety and worry, and improved daily functioning. Description: Learn and implement problem-solving strategies for realistically addressing worries. Target Date: 2022-05-31 Frequency: Weekly Modality: individual Progress: 0%  Related Problem: Learn and  implement coping skills that result in a reduction of anxiety and worry, and improved daily functioning. Description: Identify and engage in pleasant activities on a daily basis. Target Date: 2022-05-31 Frequency: Weekly Modality: individual Progress: 0%  Related Problem: Learn and implement coping skills that result in a reduction of anxiety and worry, and improved daily functioning. Description: Learn and implement personal and interpersonal skills to reduce anxiety and improve interpersonal relationships. Target Date: 2022-05-31 Frequency: Weekly Modality: individual Progress: 0%  Related Problem: Learn and implement coping skills that result in a reduction of anxiety and worry, and improved daily functioning. Description: Maintain involvement in work, family, and social activities. Target Date: 2022-05-31 Frequency: Weekly Modality: individual Progress: 0%  Related Problem: Learn and implement coping skills that result in a reduction of anxiety and worry, and improved daily functioning. Description: Reestablish a consistent sleep-wake cycle. Target Date: 2022-05-31 Frequency: Weekly Modality: individual Progress: 0%  Related Problem: Learn and implement coping skills that result in a reduction of anxiety and worry, and improved daily functioning. Description: Verbalize an understanding of the role that cognitive biases play in excessive irrational worry and persistent anxiety symptoms. Target Date: 2022-05-31 Frequency: Weekly Modality: individual Progress: 0%  Related Problem: Learn and implement coping skills that result in a reduction of anxiety and worry, and improved daily functioning. Description: Complete a medical evaluation to assess for possible contribution of medical or substance-related conditions to the anxiety. Target Date: 2022-05-31 Frequency: Weekly Modality: individual Progress: 0%  Related Problem: Learn and implement coping skills that result in a  reduction of anxiety and worry, and improved daily functioning. Description: Cooperate with a medication evaluation by a physician. Target Date: 2022-05-31 Frequency: Weekly Modality: individual Progress: 0%  Related Problem: Learn and implement coping skills that result in a reduction of anxiety and worry, and improved daily functioning. Description: Learn and implement calming skills to reduce overall anxiety and manage anxiety symptoms. Target Date: 2022-05-31 Frequency: Weekly Modality: individual Progress: 0%  Related Problem: Minimize ADD behavioral interference in daily life. Description: Learn and implement organization and planning skills. Target Date: 2022-05-31 Frequency: Weekly Modality: individual Progress: 0% Planned Intervention: Teach the client organization and planning skills including the routine use of a calendar and daily task list.  Related Problem: Minimize ADD behavioral interference in daily life. Description: Learn and implement skills to reduce the disruptive influence of distractibility. Target Date: 2022-05-31 Frequency: Weekly Modality: individual Progress: 0%  Related Problem: Minimize ADD behavioral interference in daily life. Description: Combine skills learned in therapy into a new daily approach to managing ADHD. Target Date: 2022-05-31 Frequency: Weekly Modality: individual Progress: 0%  Related Problem: Minimize ADD behavioral interference in daily life. Description: Increase knowledge of ADHD and its treatment. Target Date: 2022-05-31 Frequency: Weekly Modality: individual Progress: 0%  Related Problem: Minimize ADD behavioral interference in daily life. Description: Cooperate with and complete a psychiatric evaluation. Target Date: 2022-05-31 Frequency: Weekly Modality: individual Progress: 0%  Related Problem: Minimize ADD behavioral interference in daily life. Description: Comply with all recommendations based on the  psychiatric and/or psychological evaluations. Target Date: 2022-05-31 Frequency: Weekly Modality: individual Progress: 0%  Related Problem: Minimize ADD behavioral interference in daily life. Description: Describe past and present experiences with ADD including its effects on functioning. Target Date: 2022-05-31 Frequency: Weekly Modality: individual Progress: 0%  Client Response full compliance  Service Location Location, [address] [place], [place] [address]  Service Code cpt [phone])  Organizational Skills, Time Management Skills  Identify automatic thoughts  Rationally challenge thoughts or beliefs/cognitive restructuring  Normalize/Reframe  Identify/label emotions  Validate/empathize  Assign therapy homework  Review therapy homework  Identified an insight  Lifestyle change (exercise, nutrition)  Comments    Today I met with  Olivia [name] in remote video (WebEx) face-to-face individual psychotherapy as an accomodation to the COVID-19 Pandemic.  Distance Site: [name]: Dr Jannifer Franklin Remote Office Consent: Obtained verbal consent to transmit  session remotely   Olivia [name] reports that  she continues to adjust to her new supplements/medications.  In general, she is feeling physically better, has lost a significant amount of weight and her inflammation marks have improved.  We d/e/p that while she is still not able to say YES to everything, her son/family is/are seeing positive changes and this had been good for them as well.Olivia [name] continues to battle feelings of guilt when she says NO to her son (ie., can't take him to the [employer] because she didn't sleep well).  I d/ with her how depression and anxiety can skew her perspective and she needs some data to convince her that she is doing more than she thinks.  Olivia [name] agree to log her behavior this week.  Home Practice: Log the daily  things she does for her family   Progress: patient is making  progress in learning and implementing new coping strategies to manage her mood states aeb use of simplified to do list, delegating tasks, using coping statements to address cognitive distortions   [name], PhD

## 2022-01-15 ENCOUNTER — Ambulatory Visit (INDEPENDENT_AMBULATORY_CARE_PROVIDER_SITE_OTHER): Payer: No Typology Code available for payment source | Admitting: Psychology

## 2022-01-15 DIAGNOSIS — F320 Major depressive disorder, single episode, mild: Secondary | ICD-10-CM | POA: Diagnosis not present

## 2022-01-15 NOTE — Progress Notes (Signed)
PROGRESS NOTE: ? ?Patient Name: Olivia [name]  ?DOB: 20-Oct-1966  ?Date of Service: 01/15/2022  ?Start:  10:00 AM ?End:   10:55 PM ? ?Diagnosis ?F90.0 (Attention-deficit/hyperactivity disorder, Predominately inattentive presentation) ?F41.1 (Generalized anxiety disorder) ?F33.1 (Major Depressive Disorder, single episode, moderate ? ?Symptoms: autonomic hyperactivity (e.g., palpitations, shortness of breath, dry mouth, trouble swallowing, nausea, diarrhea). (Status: maintained) -- No Description Entered  ?Childhood history of Attention Deficit Disorder (ADD) that was either diagnosed or later concluded due to the symptoms of behavioral problems at school, impulsivity, temper outbursts, and lack of concentration. (Status: maintained) -- No Description Entered  ?Depressed or irritable mood. (Status: maintained) -- No Description Entered  ?Diminished interest in or enjoyment of activities. (Status: maintained) -- No Description Entered  ?Easily distracted and drawn from task at hand. (Status: maintained) -- No Description Entered  ?Excessive and/or unrealistic worry that is difficult to control occurring more days than not for at least 6 months about a number of events or activities. (Status: maintained) -- No Description Entered  ?Feelings of hopelessness, worthlessness, or inappropriate guilt. (Status: maintained) -- No Description Entered  ?Lack of energy. (Status: maintained) -- No Description Entered  ?Poor concentration and indecisiveness. (Status: maintained) -- No Description Entered  ?Restless and fidgety; unable to be sedentary for more than a short time. (Status: maintained) -- No Description Entered  ?Medication Status ?compliance  ?Safety ?none  ?If Suicidal or Homicidal State Action Taken: unspecified  ?Current Risk: low ?Medications ?unspecified ?Objectives ?Related Problem: Develop healthy interpersonal relationships that lead to the alleviation and help prevent the relapse of depression. ?Description:  Increasingly verbalize hopeful and positive statements regarding self, others, and the future. ?Target Date: 2022-05-31 ?Frequency: Weekly ?Modality: individual ?Progress: 0% ? ?Related Problem: Develop healthy interpersonal relationships that lead to the alleviation and help prevent the relapse of depression. ?Description: Learn and implement conflict resolution skills to resolve interpersonal problems. ?Target Date: 2022-05-31 ?Frequency: Weekly ?Modality: individual ?Progress: 0% ? ?Related Problem: Develop healthy interpersonal relationships that lead to the alleviation and help prevent the relapse of depression. ?Description: Verbalize an understanding and resolution of current interpersonal problems. ?Target Date: 2022-05-31 ?Frequency: Weekly ?Modality: individual ?Progress: 0% ? ?Related Problem: Develop healthy interpersonal relationships that lead to the alleviation and help prevent the relapse of depression. ?Description: Learn and implement problem-solving and decision-making skills. ?Target Date: 2022-05-31 ?Frequency: Weekly ?Modality: individual ?Progress: 0% ?Planned Intervention: Encourage in the client the development of a positive problem orientation in which problems and solving them are viewed as a natural part of life and not something to be feared, despaired, or avoided.  ?Related Problem: Learn and implement coping skills that result in a reduction of anxiety and worry, and improved daily functioning. ?Description: Identify, challenge, and replace biased, fearful self-talk with positive, realistic, and empowering self-talk. ?Target Date: 2022-05-31 ?Frequency: Weekly ?Modality: individual ?Progress: 0% ? ?Related Problem: Learn and implement coping skills that result in a reduction of anxiety and worry, and improved daily functioning. ?Description: Learn and implement problem-solving strategies for realistically addressing worries. ?Target Date: 2022-05-31 ?Frequency: Weekly ?Modality:  individual ?Progress: 0% ? ?Related Problem: Learn and implement coping skills that result in a reduction of anxiety and worry, and improved daily functioning. ?Description: Identify and engage in pleasant activities on a daily basis. ?Target Date: 2022-05-31 ?Frequency: Weekly ?Modality: individual ?Progress: 0% ? ?Related Problem: Learn and implement coping skills that result in a reduction of anxiety and worry, and improved daily functioning. ?Description: Learn and implement personal and interpersonal skills to  reduce anxiety and improve interpersonal relationships. ?Target Date: 2022-05-31 ?Frequency: Weekly ?Modality: individual ?Progress: 0% ? ?Related Problem: Learn and implement coping skills that result in a reduction of anxiety and worry, and improved daily functioning. ?Description: Maintain involvement in work, family, and social activities. ?Target Date: 2022-05-31 ?Frequency: Weekly ?Modality: individual ?Progress: 0% ? ?Related Problem: Learn and implement coping skills that result in a reduction of anxiety and worry, and improved daily functioning. ?Description: Reestablish a consistent sleep-wake cycle. ?Target Date: 2022-05-31 ?Frequency: Weekly ?Modality: individual ?Progress: 0% ? ?Related Problem: Learn and implement coping skills that result in a reduction of anxiety and worry, and improved daily functioning. ?Description: Verbalize an understanding of the role that cognitive biases play in excessive irrational worry and persistent anxiety symptoms. ?Target Date: 2022-05-31 ?Frequency: Weekly ?Modality: individual ?Progress: 0% ? ?Related Problem: Learn and implement coping skills that result in a reduction of anxiety and worry, and improved daily functioning. ?Description: Complete a medical evaluation to assess for possible contribution of medical or substance-related conditions to the anxiety. ?Target Date: 2022-05-31 ?Frequency: Weekly ?Modality: individual ?Progress: 0% ? ?Related  Problem: Learn and implement coping skills that result in a reduction of anxiety and worry, and improved daily functioning. ?Description: Cooperate with a medication evaluation by a physician. ?Target Date: 2022-05-31 ?Frequency: Weekly ?Modality: individual ?Progress: 0% ? ?Related Problem: Learn and implement coping skills that result in a reduction of anxiety and worry, and improved daily functioning. ?Description: Learn and implement calming skills to reduce overall anxiety and manage anxiety symptoms. ?Target Date: 2022-05-31 ?Frequency: Weekly ?Modality: individual ?Progress: 0% ? ?Related Problem: Minimize ADD behavioral interference in daily life. ?Description: Learn and implement organization and planning skills. ?Target Date: 2022-05-31 ?Frequency: Weekly ?Modality: individual ?Progress: 0% ?Planned Intervention: Teach the client organization and planning skills including the routine use of a calendar and daily task list.  ?Related Problem: Minimize ADD behavioral interference in daily life. ?Description: Learn and implement skills to reduce the disruptive influence of distractibility. ?Target Date: 2022-05-31 ?Frequency: Weekly ?Modality: individual ?Progress: 0% ? ?Related Problem: Minimize ADD behavioral interference in daily life. ?Description: Combine skills learned in therapy into a new daily approach to managing ADHD. ?Target Date: 2022-05-31 ?Frequency: Weekly ?Modality: individual ?Progress: 0% ? ?Related Problem: Minimize ADD behavioral interference in daily life. ?Description: Increase knowledge of ADHD and its treatment. ?Target Date: 2022-05-31 ?Frequency: Weekly ?Modality: individual ?Progress: 0% ? ?Related Problem: Minimize ADD behavioral interference in daily life. ?Description: Cooperate with and complete a psychiatric evaluation. ?Target Date: 2022-05-31 ?Frequency: Weekly ?Modality: individual ?Progress: 0% ? ?Related Problem: Minimize ADD behavioral interference in daily  life. ?Description: Comply with all recommendations based on the psychiatric and/or psychological evaluations. ?Target Date: 2022-05-31 ?Frequency: Weekly ?Modality: individual ?Progress: 0% ? ?Related Problem: Minimize ADD beha

## 2022-01-22 ENCOUNTER — Ambulatory Visit (INDEPENDENT_AMBULATORY_CARE_PROVIDER_SITE_OTHER): Payer: No Typology Code available for payment source | Admitting: Psychology

## 2022-01-22 DIAGNOSIS — F320 Major depressive disorder, single episode, mild: Secondary | ICD-10-CM | POA: Diagnosis not present

## 2022-01-22 NOTE — Progress Notes (Signed)
PROGRESS NOTE: ? ?Patient Name: Olivia [name]  ?DOB: [birth year]  ?MRN: [id number]  ?PCP: Leamon Arnt, MD  ? ?Date of Service: 01/22/2022  ?Start:  4:00 PM ?End:   4:55 PM ? ?Diagnosis ?F90.0 (Attention-deficit/hyperactivity disorder, Predominately inattentive presentation) ?F41.1 (Generalized anxiety disorder) ?F33.1 (Major Depressive Disorder, single episode, moderate ? ?Symptoms: autonomic hyperactivity (e.g., palpitations, shortness of breath, dry mouth, trouble swallowing, nausea, diarrhea). (Status: maintained) -- No Description Entered  ?Childhood history of Attention Deficit Disorder (ADD) that was either diagnosed or later concluded due to the symptoms of behavioral problems at school, impulsivity, temper outbursts, and lack of concentration. (Status: maintained) -- No Description Entered  ?Depressed or irritable mood. (Status: maintained) -- No Description Entered  ?Diminished interest in or enjoyment of activities. (Status: maintained) -- No Description Entered  ?Easily distracted and drawn from task at hand. (Status: maintained) -- No Description Entered  ?Excessive and/or unrealistic worry that is difficult to control occurring more days than not for at least 6 months about a number of events or activities. (Status: maintained) -- No Description Entered  ?Feelings of hopelessness, worthlessness, or inappropriate guilt. (Status: maintained) -- No Description Entered  ?Lack of energy. (Status: maintained) -- No Description Entered  ?Poor concentration and indecisiveness. (Status: maintained) -- No Description Entered  ?Restless and fidgety; unable to be sedentary for more than a short time. (Status: maintained) -- No Description Entered  ?Medication Status ?compliance  ?Safety ?none  ?If Suicidal or Homicidal State Action Taken: unspecified  ?Current Risk: low ?Medications ?unspecified ?Objectives ?Related Problem: Develop healthy interpersonal relationships that lead to the alleviation and help  prevent the relapse of depression. ?Description: Increasingly verbalize hopeful and positive statements regarding self, others, and the future. ?Target Date: 2022-05-31 ?Frequency: Weekly ?Modality: individual ?Progress: 0% ? ?Related Problem: Develop healthy interpersonal relationships that lead to the alleviation and help prevent the relapse of depression. ?Description: Learn and implement conflict resolution skills to resolve interpersonal problems. ?Target Date: 2022-05-31 ?Frequency: Weekly ?Modality: individual ?Progress: 0% ? ?Related Problem: Develop healthy interpersonal relationships that lead to the alleviation and help prevent the relapse of depression. ?Description: Verbalize an understanding and resolution of current interpersonal problems. ?Target Date: 2022-05-31 ?Frequency: Weekly ?Modality: individual ?Progress: 0% ? ?Related Problem: Develop healthy interpersonal relationships that lead to the alleviation and help prevent the relapse of depression. ?Description: Learn and implement problem-solving and decision-making skills. ?Target Date: 2022-05-31 ?Frequency: Weekly ?Modality: individual ?Progress: 0% ?Planned Intervention: Encourage in the client the development of a positive problem orientation in which problems and solving them are viewed as a natural part of life and not something to be feared, despaired, or avoided.  ?Related Problem: Learn and implement coping skills that result in a reduction of anxiety and worry, and improved daily functioning. ?Description: Identify, challenge, and replace biased, fearful self-talk with positive, realistic, and empowering self-talk. ?Target Date: 2022-05-31 ?Frequency: Weekly ?Modality: individual ?Progress: 0% ? ?Related Problem: Learn and implement coping skills that result in a reduction of anxiety and worry, and improved daily functioning. ?Description: Learn and implement problem-solving strategies for realistically addressing worries. ?Target  Date: 2022-05-31 ?Frequency: Weekly ?Modality: individual ?Progress: 0% ? ?Related Problem: Learn and implement coping skills that result in a reduction of anxiety and worry, and improved daily functioning. ?Description: Identify and engage in pleasant activities on a daily basis. ?Target Date: 2022-05-31 ?Frequency: Weekly ?Modality: individual ?Progress: 0% ? ?Related Problem: Learn and implement coping skills that result in a reduction of anxiety and worry, and improved daily  functioning. ?Description: Learn and implement personal and interpersonal skills to reduce anxiety and improve interpersonal relationships. ?Target Date: 2022-05-31 ?Frequency: Weekly ?Modality: individual ?Progress: 0% ? ?Related Problem: Learn and implement coping skills that result in a reduction of anxiety and worry, and improved daily functioning. ?Description: Maintain involvement in work, family, and social activities. ?Target Date: 2022-05-31 ?Frequency: Weekly ?Modality: individual ?Progress: 0% ? ?Related Problem: Learn and implement coping skills that result in a reduction of anxiety and worry, and improved daily functioning. ?Description: Reestablish a consistent sleep-wake cycle. ?Target Date: 2022-05-31 ?Frequency: Weekly ?Modality: individual ?Progress: 0% ? ?Related Problem: Learn and implement coping skills that result in a reduction of anxiety and worry, and improved daily functioning. ?Description: Verbalize an understanding of the role that cognitive biases play in excessive irrational worry and persistent anxiety symptoms. ?Target Date: 2022-05-31 ?Frequency: Weekly ?Modality: individual ?Progress: 0% ? ?Related Problem: Learn and implement coping skills that result in a reduction of anxiety and worry, and improved daily functioning. ?Description: Complete a medical evaluation to assess for possible contribution of medical or substance-related conditions to the anxiety. ?Target Date: 2022-05-31 ?Frequency:  Weekly ?Modality: individual ?Progress: 0% ? ?Related Problem: Learn and implement coping skills that result in a reduction of anxiety and worry, and improved daily functioning. ?Description: Cooperate with a medication evaluation by a physician. ?Target Date: 2022-05-31 ?Frequency: Weekly ?Modality: individual ?Progress: 0% ? ?Related Problem: Learn and implement coping skills that result in a reduction of anxiety and worry, and improved daily functioning. ?Description: Learn and implement calming skills to reduce overall anxiety and manage anxiety symptoms. ?Target Date: 2022-05-31 ?Frequency: Weekly ?Modality: individual ?Progress: 0% ? ?Related Problem: Minimize ADD behavioral interference in daily life. ?Description: Learn and implement organization and planning skills. ?Target Date: 2022-05-31 ?Frequency: Weekly ?Modality: individual ?Progress: 0% ?Planned Intervention: Teach the client organization and planning skills including the routine use of a calendar and daily task list.  ?Related Problem: Minimize ADD behavioral interference in daily life. ?Description: Learn and implement skills to reduce the disruptive influence of distractibility. ?Target Date: 2022-05-31 ?Frequency: Weekly ?Modality: individual ?Progress: 0% ? ?Related Problem: Minimize ADD behavioral interference in daily life. ?Description: Combine skills learned in therapy into a new daily approach to managing ADHD. ?Target Date: 2022-05-31 ?Frequency: Weekly ?Modality: individual ?Progress: 0% ? ?Related Problem: Minimize ADD behavioral interference in daily life. ?Description: Increase knowledge of ADHD and its treatment. ?Target Date: 2022-05-31 ?Frequency: Weekly ?Modality: individual ?Progress: 0% ? ?Related Problem: Minimize ADD behavioral interference in daily life. ?Description: Cooperate with and complete a psychiatric evaluation. ?Target Date: 2022-05-31 ?Frequency: Weekly ?Modality: individual ?Progress: 0% ? ?Related Problem:  Minimize ADD behavioral interference in daily life. ?Description: Comply with all recommendations based on the psychiatric and/or psychological evaluations. ?Target Date: 2022-05-31 ?Frequency: Weekly ?Modality: individual ?Progre

## 2022-01-29 ENCOUNTER — Ambulatory Visit (INDEPENDENT_AMBULATORY_CARE_PROVIDER_SITE_OTHER): Payer: No Typology Code available for payment source | Admitting: Psychology

## 2022-01-29 DIAGNOSIS — F320 Major depressive disorder, single episode, mild: Secondary | ICD-10-CM | POA: Diagnosis not present

## 2022-01-29 NOTE — Progress Notes (Signed)
PROGRESS NOTE: ? ?Patient Name: Olivia [name]  ?DOB: 1966/08/20  ?MRN: [id number]  ?PCP: Leamon Arnt, MD  ? ?Date of Service: 01/29/2022  ?Start:  4:00 PM ?End:   4:55 PM ? ?Diagnosis ?F90.0 (Attention-deficit/hyperactivity disorder, Predominately inattentive presentation) ?F41.1 (Generalized anxiety disorder) ?F33.1 (Major Depressive Disorder, single episode, moderate ? ?Symptoms: autonomic hyperactivity (e.g., palpitations, shortness of breath, dry mouth, trouble swallowing, nausea, diarrhea). (Status: maintained) -- No Description Entered  ?Childhood history of Attention Deficit Disorder (ADD) that was either diagnosed or later concluded due to the symptoms of behavioral problems at school, impulsivity, temper outbursts, and lack of concentration. (Status: maintained) -- No Description Entered  ?Depressed or irritable mood. (Status: maintained) -- No Description Entered  ?Diminished interest in or enjoyment of activities. (Status: maintained) -- No Description Entered  ?Easily distracted and drawn from task at hand. (Status: maintained) -- No Description Entered  ?Excessive and/or unrealistic worry that is difficult to control occurring more days than not for at least 6 months about a number of events or activities. (Status: maintained) -- No Description Entered  ?Feelings of hopelessness, worthlessness, or inappropriate guilt. (Status: maintained) -- No Description Entered  ?Lack of energy. (Status: maintained) -- No Description Entered  ?Poor concentration and indecisiveness. (Status: maintained) -- No Description Entered  ?Restless and fidgety; unable to be sedentary for more than a short time. (Status: maintained) -- No Description Entered  ?Medication Status ?compliance  ?Safety ?none  ?If Suicidal or Homicidal State Action Taken: unspecified  ?Current Risk: low ?Medications ?unspecified ?Objectives ?Related Problem: Develop healthy interpersonal relationships that lead to the alleviation and help  prevent the relapse of depression. ?Description: Increasingly verbalize hopeful and positive statements regarding self, others, and the future. ?Target Date: 2022-05-31 ?Frequency: Weekly ?Modality: individual ?Progress: 0% ? ?Related Problem: Develop healthy interpersonal relationships that lead to the alleviation and help prevent the relapse of depression. ?Description: Learn and implement conflict resolution skills to resolve interpersonal problems. ?Target Date: 2022-05-31 ?Frequency: Weekly ?Modality: individual ?Progress: 0% ? ?Related Problem: Develop healthy interpersonal relationships that lead to the alleviation and help prevent the relapse of depression. ?Description: Verbalize an understanding and resolution of current interpersonal problems. ?Target Date: 2022-05-31 ?Frequency: Weekly ?Modality: individual ?Progress: 0% ? ?Related Problem: Develop healthy interpersonal relationships that lead to the alleviation and help prevent the relapse of depression. ?Description: Learn and implement problem-solving and decision-making skills. ?Target Date: 2022-05-31 ?Frequency: Weekly ?Modality: individual ?Progress: 0% ?Planned Intervention: Encourage in the client the development of a positive problem orientation in which problems and solving them are viewed as a natural part of life and not something to be feared, despaired, or avoided.  ?Related Problem: Learn and implement coping skills that result in a reduction of anxiety and worry, and improved daily functioning. ?Description: Identify, challenge, and replace biased, fearful self-talk with positive, realistic, and empowering self-talk. ?Target Date: 2022-05-31 ?Frequency: Weekly ?Modality: individual ?Progress: 0% ? ?Related Problem: Learn and implement coping skills that result in a reduction of anxiety and worry, and improved daily functioning. ?Description: Learn and implement problem-solving strategies for realistically addressing worries. ?Target  Date: 2022-05-31 ?Frequency: Weekly ?Modality: individual ?Progress: 0% ? ?Related Problem: Learn and implement coping skills that result in a reduction of anxiety and worry, and improved daily functioning. ?Description: Identify and engage in pleasant activities on a daily basis. ?Target Date: 2022-05-31 ?Frequency: Weekly ?Modality: individual ?Progress: 0% ? ?Related Problem: Learn and implement coping skills that result in a reduction of anxiety and worry, and improved daily  functioning. ?Description: Learn and implement personal and interpersonal skills to reduce anxiety and improve interpersonal relationships. ?Target Date: 2022-05-31 ?Frequency: Weekly ?Modality: individual ?Progress: 0% ? ?Related Problem: Learn and implement coping skills that result in a reduction of anxiety and worry, and improved daily functioning. ?Description: Maintain involvement in work, family, and social activities. ?Target Date: 2022-05-31 ?Frequency: Weekly ?Modality: individual ?Progress: 0% ? ?Related Problem: Learn and implement coping skills that result in a reduction of anxiety and worry, and improved daily functioning. ?Description: Reestablish a consistent sleep-wake cycle. ?Target Date: 2022-05-31 ?Frequency: Weekly ?Modality: individual ?Progress: 0% ? ?Related Problem: Learn and implement coping skills that result in a reduction of anxiety and worry, and improved daily functioning. ?Description: Verbalize an understanding of the role that cognitive biases play in excessive irrational worry and persistent anxiety symptoms. ?Target Date: 2022-05-31 ?Frequency: Weekly ?Modality: individual ?Progress: 0% ? ?Related Problem: Learn and implement coping skills that result in a reduction of anxiety and worry, and improved daily functioning. ?Description: Complete a medical evaluation to assess for possible contribution of medical or substance-related conditions to the anxiety. ?Target Date: 2022-05-31 ?Frequency:  Weekly ?Modality: individual ?Progress: 0% ? ?Related Problem: Learn and implement coping skills that result in a reduction of anxiety and worry, and improved daily functioning. ?Description: Cooperate with a medication evaluation by a physician. ?Target Date: 2022-05-31 ?Frequency: Weekly ?Modality: individual ?Progress: 0% ? ?Related Problem: Learn and implement coping skills that result in a reduction of anxiety and worry, and improved daily functioning. ?Description: Learn and implement calming skills to reduce overall anxiety and manage anxiety symptoms. ?Target Date: 2022-05-31 ?Frequency: Weekly ?Modality: individual ?Progress: 0% ? ?Related Problem: Minimize ADD behavioral interference in daily life. ?Description: Learn and implement organization and planning skills. ?Target Date: 2022-05-31 ?Frequency: Weekly ?Modality: individual ?Progress: 0% ?Planned Intervention: Teach the client organization and planning skills including the routine use of a calendar and daily task list.  ?Related Problem: Minimize ADD behavioral interference in daily life. ?Description: Learn and implement skills to reduce the disruptive influence of distractibility. ?Target Date: 2022-05-31 ?Frequency: Weekly ?Modality: individual ?Progress: 0% ? ?Related Problem: Minimize ADD behavioral interference in daily life. ?Description: Combine skills learned in therapy into a new daily approach to managing ADHD. ?Target Date: 2022-05-31 ?Frequency: Weekly ?Modality: individual ?Progress: 0% ? ?Related Problem: Minimize ADD behavioral interference in daily life. ?Description: Increase knowledge of ADHD and its treatment. ?Target Date: 2022-05-31 ?Frequency: Weekly ?Modality: individual ?Progress: 0% ? ?Related Problem: Minimize ADD behavioral interference in daily life. ?Description: Cooperate with and complete a psychiatric evaluation. ?Target Date: 2022-05-31 ?Frequency: Weekly ?Modality: individual ?Progress: 0% ? ?Related Problem:  Minimize ADD behavioral interference in daily life. ?Description: Comply with all recommendations based on the psychiatric and/or psychological evaluations. ?Target Date: 2022-05-31 ?Frequency: Weekly ?Modality: individual ?Progre

## 2022-02-05 ENCOUNTER — Ambulatory Visit (INDEPENDENT_AMBULATORY_CARE_PROVIDER_SITE_OTHER): Payer: No Typology Code available for payment source | Admitting: Psychology

## 2022-02-05 DIAGNOSIS — F320 Major depressive disorder, single episode, mild: Secondary | ICD-10-CM | POA: Diagnosis not present

## 2022-02-05 NOTE — Progress Notes (Signed)
PROGRESS NOTE: ? ?Patient Name: Olivia [name]  ?DOB: 1965/12/06  ?MRN: [id number]  ?PCP: Leamon Arnt, MD  ? ?Date of Service: 02/05/2022  ?Start:  4:00 PM ?End:   4:55 PM ? ?Diagnosis ?F90.0 (Attention-deficit/hyperactivity disorder, Predominately inattentive presentation) ?F41.1 (Generalized anxiety disorder) ?F33.1 (Major Depressive Disorder, single episode, moderate ? ?Symptoms: autonomic hyperactivity (e.g., palpitations, shortness of breath, dry mouth, trouble swallowing, nausea, diarrhea). (Status: maintained) -- No Description Entered  ?Childhood history of Attention Deficit Disorder (ADD) that was either diagnosed or later concluded due to the symptoms of behavioral problems at school, impulsivity, temper outbursts, and lack of concentration. (Status: maintained) -- No Description Entered  ?Depressed or irritable mood. (Status: maintained) -- No Description Entered  ?Diminished interest in or enjoyment of activities. (Status: maintained) -- No Description Entered  ?Easily distracted and drawn from task at hand. (Status: maintained) -- No Description Entered  ?Excessive and/or unrealistic worry that is difficult to control occurring more days than not for at least 6 months about a number of events or activities. (Status: maintained) -- No Description Entered  ?Feelings of hopelessness, worthlessness, or inappropriate guilt. (Status: maintained) -- No Description Entered  ?Lack of energy. (Status: maintained) -- No Description Entered  ?Poor concentration and indecisiveness. (Status: maintained) -- No Description Entered  ?Restless and fidgety; unable to be sedentary for more than a short time. (Status: maintained) -- No Description Entered  ?Medication Status ?compliance  ?Safety ?none  ?If Suicidal or Homicidal State Action Taken: unspecified  ?Current Risk: low ?Medications ?unspecified ?Objectives ?Related Problem: Develop healthy interpersonal relationships that lead to the alleviation and help  prevent the relapse of depression. ?Description: Increasingly verbalize hopeful and positive statements regarding self, others, and the future. ?Target Date: 2022-05-31 ?Frequency: Weekly ?Modality: individual ?Progress: 0% ? ?Related Problem: Develop healthy interpersonal relationships that lead to the alleviation and help prevent the relapse of depression. ?Description: Learn and implement conflict resolution skills to resolve interpersonal problems. ?Target Date: 2022-05-31 ?Frequency: Weekly ?Modality: individual ?Progress: 0% ? ?Related Problem: Develop healthy interpersonal relationships that lead to the alleviation and help prevent the relapse of depression. ?Description: Verbalize an understanding and resolution of current interpersonal problems. ?Target Date: 2022-05-31 ?Frequency: Weekly ?Modality: individual ?Progress: 0% ? ?Related Problem: Develop healthy interpersonal relationships that lead to the alleviation and help prevent the relapse of depression. ?Description: Learn and implement problem-solving and decision-making skills. ?Target Date: 2022-05-31 ?Frequency: Weekly ?Modality: individual ?Progress: 0% ?Planned Intervention: Encourage in the client the development of a positive problem orientation in which problems and solving them are viewed as a natural part of life and not something to be feared, despaired, or avoided.  ?Related Problem: Learn and implement coping skills that result in a reduction of anxiety and worry, and improved daily functioning. ?Description: Identify, challenge, and replace biased, fearful self-talk with positive, realistic, and empowering self-talk. ?Target Date: 2022-05-31 ?Frequency: Weekly ?Modality: individual ?Progress: 0% ? ?Related Problem: Learn and implement coping skills that result in a reduction of anxiety and worry, and improved daily functioning. ?Description: Learn and implement problem-solving strategies for realistically addressing worries. ?Target  Date: 2022-05-31 ?Frequency: Weekly ?Modality: individual ?Progress: 0% ? ?Related Problem: Learn and implement coping skills that result in a reduction of anxiety and worry, and improved daily functioning. ?Description: Identify and engage in pleasant activities on a daily basis. ?Target Date: 2022-05-31 ?Frequency: Weekly ?Modality: individual ?Progress: 0% ? ?Related Problem: Learn and implement coping skills that result in a reduction of anxiety and worry, and improved daily  functioning. ?Description: Learn and implement personal and interpersonal skills to reduce anxiety and improve interpersonal relationships. ?Target Date: 2022-05-31 ?Frequency: Weekly ?Modality: individual ?Progress: 0% ? ?Related Problem: Learn and implement coping skills that result in a reduction of anxiety and worry, and improved daily functioning. ?Description: Maintain involvement in work, family, and social activities. ?Target Date: 2022-05-31 ?Frequency: Weekly ?Modality: individual ?Progress: 0% ? ?Related Problem: Learn and implement coping skills that result in a reduction of anxiety and worry, and improved daily functioning. ?Description: Reestablish a consistent sleep-wake cycle. ?Target Date: 2022-05-31 ?Frequency: Weekly ?Modality: individual ?Progress: 0% ? ?Related Problem: Learn and implement coping skills that result in a reduction of anxiety and worry, and improved daily functioning. ?Description: Verbalize an understanding of the role that cognitive biases play in excessive irrational worry and persistent anxiety symptoms. ?Target Date: 2022-05-31 ?Frequency: Weekly ?Modality: individual ?Progress: 0% ? ?Related Problem: Learn and implement coping skills that result in a reduction of anxiety and worry, and improved daily functioning. ?Description: Complete a medical evaluation to assess for possible contribution of medical or substance-related conditions to the anxiety. ?Target Date: 2022-05-31 ?Frequency:  Weekly ?Modality: individual ?Progress: 0% ? ?Related Problem: Learn and implement coping skills that result in a reduction of anxiety and worry, and improved daily functioning. ?Description: Cooperate with a medication evaluation by a physician. ?Target Date: 2022-05-31 ?Frequency: Weekly ?Modality: individual ?Progress: 0% ? ?Related Problem: Learn and implement coping skills that result in a reduction of anxiety and worry, and improved daily functioning. ?Description: Learn and implement calming skills to reduce overall anxiety and manage anxiety symptoms. ?Target Date: 2022-05-31 ?Frequency: Weekly ?Modality: individual ?Progress: 0% ? ?Related Problem: Minimize ADD behavioral interference in daily life. ?Description: Learn and implement organization and planning skills. ?Target Date: 2022-05-31 ?Frequency: Weekly ?Modality: individual ?Progress: 0% ?Planned Intervention: Teach the client organization and planning skills including the routine use of a calendar and daily task list.  ?Related Problem: Minimize ADD behavioral interference in daily life. ?Description: Learn and implement skills to reduce the disruptive influence of distractibility. ?Target Date: 2022-05-31 ?Frequency: Weekly ?Modality: individual ?Progress: 0% ? ?Related Problem: Minimize ADD behavioral interference in daily life. ?Description: Combine skills learned in therapy into a new daily approach to managing ADHD. ?Target Date: 2022-05-31 ?Frequency: Weekly ?Modality: individual ?Progress: 0% ? ?Related Problem: Minimize ADD behavioral interference in daily life. ?Description: Increase knowledge of ADHD and its treatment. ?Target Date: 2022-05-31 ?Frequency: Weekly ?Modality: individual ?Progress: 0% ? ?Related Problem: Minimize ADD behavioral interference in daily life. ?Description: Cooperate with and complete a psychiatric evaluation. ?Target Date: 2022-05-31 ?Frequency: Weekly ?Modality: individual ?Progress: 0% ? ?Related Problem:  Minimize ADD behavioral interference in daily life. ?Description: Comply with all recommendations based on the psychiatric and/or psychological evaluations. ?Target Date: 2022-05-31 ?Frequency: Weekly ?Modality: individual ?Progre

## 2022-02-12 ENCOUNTER — Ambulatory Visit (INDEPENDENT_AMBULATORY_CARE_PROVIDER_SITE_OTHER): Payer: No Typology Code available for payment source | Admitting: Psychology

## 2022-02-12 DIAGNOSIS — F320 Major depressive disorder, single episode, mild: Secondary | ICD-10-CM

## 2022-02-12 NOTE — Progress Notes (Signed)
PROGRESS NOTE ? ?Patient Name: Olivia [name]  ?DOB: 10-16-1966  ?MRN: [id number]  ?PCP: Leamon Arnt, MD  ? ?Date of Service: 02/12/2022  ?Start:  4:00 PM ?End:   4:55 PM ? ? ?Annual Review: 05/31/2022 ? ?Diagnosis ?F90.0 (Attention-deficit/hyperactivity disorder, Predominately inattentive presentation) ?F41.1 (Generalized anxiety disorder) ?F33.1 (Major Depressive Disorder, single episode, moderate) ? ?Symptoms:  ?autonomic hyperactivity (e.g., palpitations, shortness of breath, dry mouth, trouble swallowing, nausea, diarrhea).  ?Childhood history of Attention Deficit Disorder (ADD) that was either diagnosed or later concluded due to the symptoms of behavioral problems at school, impulsivity, temper outbursts, and lack of concentration.  ?Depressed or irritable mood. ?Diminished interest in or enjoyment of activities. ?Easily distracted and drawn from task at hand.  ?Excessive and/or unrealistic worry that is difficult to control occurring more days than not for at least 6 months about a number of events or activities.  ?Feelings of hopelessness, worthlessness, or inappropriate guilt.  ?Lack of energy.   ?Poor concentration and indecisiveness.  ?Restless and fidgety; unable to be sedentary for more than a short time.  ? ?Medication Status ?compliance  ?Safety ?none  ?If Suicidal or Homicidal State Action Taken: unspecified  ?Current Risk: low ?Medications ?unspecified ?Objectives ?Related Problem: Develop healthy interpersonal relationships that lead to the alleviation and help prevent the relapse of depression. ?Description: Increasingly verbalize hopeful and positive statements regarding self, others, and the future. ?Target Date: 2022-05-31 ?Frequency: Weekly ?Modality: individual ?Progress: 0% ? ?Related Problem: Develop healthy interpersonal relationships that lead to the alleviation and help prevent the relapse of depression. ?Description: Learn and implement conflict resolution skills to resolve  interpersonal problems. ?Target Date: 2022-05-31 ?Frequency: Weekly ?Modality: individual ?Progress: 0% ? ?Related Problem: Develop healthy interpersonal relationships that lead to the alleviation and help prevent the relapse of depression. ?Description: Verbalize an understanding and resolution of current interpersonal problems. ?Target Date: 2022-05-31 ?Frequency: Weekly ?Modality: individual ?Progress: 0% ? ?Related Problem: Develop healthy interpersonal relationships that lead to the alleviation and help prevent the relapse of depression. ?Description: Learn and implement problem-solving and decision-making skills. ?Target Date: 2022-05-31 ?Frequency: Weekly ?Modality: individual ?Progress: 0% ?Planned Intervention: Encourage in the client the development of a positive problem orientation in which problems and solving them are viewed as a natural part of life and not something to be feared, despaired, or avoided.  ?Related Problem: Learn and implement coping skills that result in a reduction of anxiety and worry, and improved daily functioning. ?Description: Identify, challenge, and replace biased, fearful self-talk with positive, realistic, and empowering self-talk. ?Target Date: 2022-05-31 ?Frequency: Weekly ?Modality: individual ?Progress: 0% ? ?Related Problem: Learn and implement coping skills that result in a reduction of anxiety and worry, and improved daily functioning. ?Description: Learn and implement problem-solving strategies for realistically addressing worries. ?Target Date: 2022-05-31 ?Frequency: Weekly ?Modality: individual ?Progress: 0% ? ?Related Problem: Learn and implement coping skills that result in a reduction of anxiety and worry, and improved daily functioning. ?Description: Identify and engage in pleasant activities on a daily basis. ?Target Date: 2022-05-31 ?Frequency: Weekly ?Modality: individual ?Progress: 0% ? ?Related Problem: Learn and implement coping skills that result in a  reduction of anxiety and worry, and improved daily functioning. ?Description: Learn and implement personal and interpersonal skills to reduce anxiety and improve interpersonal relationships. ?Target Date: 2022-05-31 ?Frequency: Weekly ?Modality: individual ?Progress: 0% ? ?Related Problem: Learn and implement coping skills that result in a reduction of anxiety and worry, and improved daily functioning. ?Description: Maintain involvement in work, family, and social  activities. ?Target Date: 2022-05-31 ?Frequency: Weekly ?Modality: individual ?Progress: 0% ? ?Related Problem: Learn and implement coping skills that result in a reduction of anxiety and worry, and improved daily functioning. ?Description: Reestablish a consistent sleep-wake cycle. ?Target Date: 2022-05-31 ?Frequency: Weekly ?Modality: individual ?Progress: 0% ? ?Related Problem: Learn and implement coping skills that result in a reduction of anxiety and worry, and improved daily functioning. ?Description: Verbalize an understanding of the role that cognitive biases play in excessive irrational worry and persistent anxiety symptoms. ?Target Date: 2022-05-31 ?Frequency: Weekly ?Modality: individual ?Progress: 0% ? ?Related Problem: Learn and implement coping skills that result in a reduction of anxiety and worry, and improved daily functioning. ?Description: Complete a medical evaluation to assess for possible contribution of medical or substance-related conditions to the anxiety. ?Target Date: 2022-05-31 ?Frequency: Weekly ?Modality: individual ?Progress: 0% ? ?Related Problem: Learn and implement coping skills that result in a reduction of anxiety and worry, and improved daily functioning. ?Description: Cooperate with a medication evaluation by a physician. ?Target Date: 2022-05-31 ?Frequency: Weekly ?Modality: individual ?Progress: 0% ? ?Related Problem: Learn and implement coping skills that result in a reduction of anxiety and worry, and improved  daily functioning. ?Description: Learn and implement calming skills to reduce overall anxiety and manage anxiety symptoms. ?Target Date: 2022-05-31 ?Frequency: Weekly ?Modality: individual ?Progress: 0% ? ?Related Problem: Minimize ADD behavioral interference in daily life. ?Description: Learn and implement organization and planning skills. ?Target Date: 2022-05-31 ?Frequency: Weekly ?Modality: individual ?Progress: 0% ?Planned Intervention: Teach the client organization and planning skills including the routine use of a calendar and daily task list.  ?Related Problem: Minimize ADD behavioral interference in daily life. ?Description: Learn and implement skills to reduce the disruptive influence of distractibility. ?Target Date: 2022-05-31 ?Frequency: Weekly ?Modality: individual ?Progress: 0% ? ?Related Problem: Minimize ADD behavioral interference in daily life. ?Description: Combine skills learned in therapy into a new daily approach to managing ADHD. ?Target Date: 2022-05-31 ?Frequency: Weekly ?Modality: individual ?Progress: 0% ? ?Related Problem: Minimize ADD behavioral interference in daily life. ?Description: Increase knowledge of ADHD and its treatment. ?Target Date: 2022-05-31 ?Frequency: Weekly ?Modality: individual ?Progress: 0% ? ?Related Problem: Minimize ADD behavioral interference in daily life. ?Description: Cooperate with and complete a psychiatric evaluation. ?Target Date: 2022-05-31 ?Frequency: Weekly ?Modality: individual ?Progress: 0% ? ?Related Problem: Minimize ADD behavioral interference in daily life. ?Description: Comply with all recommendations based on the psychiatric and/or psychological evaluations. ?Target Date: 2022-05-31 ?Frequency: Weekly ?Modality: individual ?Progress: 0% ? ?Related Problem: Minimize ADD behavioral interference in daily life. ?Description: Describe past and present experiences with ADD including its effects on functioning. ?Target Date: 2022-05-31 ?Frequency:  Weekly ?Modality: individual ?Progress: 0% ? ?Client Response ?full compliance  ?Service Location ?Location, [address] [place], [place] [address]  ?Service Code ?cpt 94854O (95)  ?Organizational Skills, Time Manage

## 2022-02-13 ENCOUNTER — Other Ambulatory Visit (HOSPITAL_COMMUNITY): Payer: Self-pay

## 2022-02-14 ENCOUNTER — Other Ambulatory Visit (HOSPITAL_BASED_OUTPATIENT_CLINIC_OR_DEPARTMENT_OTHER): Payer: Self-pay

## 2022-02-14 ENCOUNTER — Other Ambulatory Visit (HOSPITAL_COMMUNITY): Payer: Self-pay

## 2022-02-19 ENCOUNTER — Ambulatory Visit (INDEPENDENT_AMBULATORY_CARE_PROVIDER_SITE_OTHER): Payer: No Typology Code available for payment source | Admitting: Psychology

## 2022-02-19 DIAGNOSIS — F320 Major depressive disorder, single episode, mild: Secondary | ICD-10-CM

## 2022-02-19 NOTE — Progress Notes (Signed)
PROGRESS NOTE ? ?Patient Name: Olivia [name]  ?DOB: March 03, 1966  ?MRN: [id number]  ?PCP: Leamon Arnt, MD  ? ?Date of Service: 02/12/2022  ?Start:  4:00 PM ?End:   4:55 PM ? ? ?Annual Review: 05/31/2022 ? ?Diagnosis ?F90.0 (Attention-deficit/hyperactivity disorder, Predominately inattentive presentation) ?F41.1 (Generalized anxiety disorder) ?F33.1 (Major Depressive Disorder, single episode, moderate) ? ?Symptoms:  ?autonomic hyperactivity (e.g., palpitations, shortness of breath, dry mouth, trouble swallowing, nausea, diarrhea).  ?Childhood history of Attention Deficit Disorder (ADD) that was either diagnosed or later concluded due to the symptoms of behavioral problems at school, impulsivity, temper outbursts, and lack of concentration.  ?Depressed or irritable mood. ?Diminished interest in or enjoyment of activities. ?Easily distracted and drawn from task at hand.  ?Excessive and/or unrealistic worry that is difficult to control occurring more days than not for at least 6 months about a number of events or activities.  ?Feelings of hopelessness, worthlessness, or inappropriate guilt.  ?Lack of energy.   ?Poor concentration and indecisiveness.  ?Restless and fidgety; unable to be sedentary for more than a short time.  ? ?Medication Status ?compliance  ?Safety ?none  ?If Suicidal or Homicidal State Action Taken: unspecified  ?Current Risk: low ?Medications ?unspecified ?Objectives ?Related Problem: Develop healthy interpersonal relationships that lead to the alleviation and help prevent the relapse of depression. ?Description: Increasingly verbalize hopeful and positive statements regarding self, others, and the future. ?Target Date: 2022-05-31 ?Frequency: Weekly ?Modality: individual ?Progress: 0% ? ?Related Problem: Develop healthy interpersonal relationships that lead to the alleviation and help prevent the relapse of depression. ?Description: Learn and implement conflict resolution skills to resolve  interpersonal problems. ?Target Date: 2022-05-31 ?Frequency: Weekly ?Modality: individual ?Progress: 0% ? ?Related Problem: Develop healthy interpersonal relationships that lead to the alleviation and help prevent the relapse of depression. ?Description: Verbalize an understanding and resolution of current interpersonal problems. ?Target Date: 2022-05-31 ?Frequency: Weekly ?Modality: individual ?Progress: 0% ? ?Related Problem: Develop healthy interpersonal relationships that lead to the alleviation and help prevent the relapse of depression. ?Description: Learn and implement problem-solving and decision-making skills. ?Target Date: 2022-05-31 ?Frequency: Weekly ?Modality: individual ?Progress: 0% ?Planned Intervention: Encourage in the client the development of a positive problem orientation in which problems and solving them are viewed as a natural part of life and not something to be feared, despaired, or avoided.  ?Related Problem: Learn and implement coping skills that result in a reduction of anxiety and worry, and improved daily functioning. ?Description: Identify, challenge, and replace biased, fearful self-talk with positive, realistic, and empowering self-talk. ?Target Date: 2022-05-31 ?Frequency: Weekly ?Modality: individual ?Progress: 0% ? ?Related Problem: Learn and implement coping skills that result in a reduction of anxiety and worry, and improved daily functioning. ?Description: Learn and implement problem-solving strategies for realistically addressing worries. ?Target Date: 2022-05-31 ?Frequency: Weekly ?Modality: individual ?Progress: 0% ? ?Related Problem: Learn and implement coping skills that result in a reduction of anxiety and worry, and improved daily functioning. ?Description: Identify and engage in pleasant activities on a daily basis. ?Target Date: 2022-05-31 ?Frequency: Weekly ?Modality: individual ?Progress: 0% ? ?Related Problem: Learn and implement coping skills that result in a  reduction of anxiety and worry, and improved daily functioning. ?Description: Learn and implement personal and interpersonal skills to reduce anxiety and improve interpersonal relationships. ?Target Date: 2022-05-31 ?Frequency: Weekly ?Modality: individual ?Progress: 0% ? ?Related Problem: Learn and implement coping skills that result in a reduction of anxiety and worry, and improved daily functioning. ?Description: Maintain involvement in work, family, and social  activities. ?Target Date: 2022-05-31 ?Frequency: Weekly ?Modality: individual ?Progress: 0% ? ?Related Problem: Learn and implement coping skills that result in a reduction of anxiety and worry, and improved daily functioning. ?Description: Reestablish a consistent sleep-wake cycle. ?Target Date: 2022-05-31 ?Frequency: Weekly ?Modality: individual ?Progress: 0% ? ?Related Problem: Learn and implement coping skills that result in a reduction of anxiety and worry, and improved daily functioning. ?Description: Verbalize an understanding of the role that cognitive biases play in excessive irrational worry and persistent anxiety symptoms. ?Target Date: 2022-05-31 ?Frequency: Weekly ?Modality: individual ?Progress: 0% ? ?Related Problem: Learn and implement coping skills that result in a reduction of anxiety and worry, and improved daily functioning. ?Description: Complete a medical evaluation to assess for possible contribution of medical or substance-related conditions to the anxiety. ?Target Date: 2022-05-31 ?Frequency: Weekly ?Modality: individual ?Progress: 0% ? ?Related Problem: Learn and implement coping skills that result in a reduction of anxiety and worry, and improved daily functioning. ?Description: Cooperate with a medication evaluation by a physician. ?Target Date: 2022-05-31 ?Frequency: Weekly ?Modality: individual ?Progress: 0% ? ?Related Problem: Learn and implement coping skills that result in a reduction of anxiety and worry, and improved  daily functioning. ?Description: Learn and implement calming skills to reduce overall anxiety and manage anxiety symptoms. ?Target Date: 2022-05-31 ?Frequency: Weekly ?Modality: individual ?Progress: 0% ? ?Related Problem: Minimize ADD behavioral interference in daily life. ?Description: Learn and implement organization and planning skills. ?Target Date: 2022-05-31 ?Frequency: Weekly ?Modality: individual ?Progress: 0% ?Planned Intervention: Teach the client organization and planning skills including the routine use of a calendar and daily task list.  ?Related Problem: Minimize ADD behavioral interference in daily life. ?Description: Learn and implement skills to reduce the disruptive influence of distractibility. ?Target Date: 2022-05-31 ?Frequency: Weekly ?Modality: individual ?Progress: 0% ? ?Related Problem: Minimize ADD behavioral interference in daily life. ?Description: Combine skills learned in therapy into a new daily approach to managing ADHD. ?Target Date: 2022-05-31 ?Frequency: Weekly ?Modality: individual ?Progress: 0% ? ?Related Problem: Minimize ADD behavioral interference in daily life. ?Description: Increase knowledge of ADHD and its treatment. ?Target Date: 2022-05-31 ?Frequency: Weekly ?Modality: individual ?Progress: 0% ? ?Related Problem: Minimize ADD behavioral interference in daily life. ?Description: Cooperate with and complete a psychiatric evaluation. ?Target Date: 2022-05-31 ?Frequency: Weekly ?Modality: individual ?Progress: 0% ? ?Related Problem: Minimize ADD behavioral interference in daily life. ?Description: Comply with all recommendations based on the psychiatric and/or psychological evaluations. ?Target Date: 2022-05-31 ?Frequency: Weekly ?Modality: individual ?Progress: 0% ? ?Related Problem: Minimize ADD behavioral interference in daily life. ?Description: Describe past and present experiences with ADD including its effects on functioning. ?Target Date: 2022-05-31 ?Frequency:  Weekly ?Modality: individual ?Progress: 0% ? ?Client Response ?full compliance  ?Service Location ?Location, [address] [place], [place] [address]  ?Service Code ?cpt 73428J (95)  ?Organizational Skills, Time Manage

## 2022-02-26 ENCOUNTER — Ambulatory Visit: Payer: No Typology Code available for payment source | Admitting: Psychology

## 2022-02-27 ENCOUNTER — Ambulatory Visit (INDEPENDENT_AMBULATORY_CARE_PROVIDER_SITE_OTHER): Payer: No Typology Code available for payment source | Admitting: Psychology

## 2022-02-27 DIAGNOSIS — F320 Major depressive disorder, single episode, mild: Secondary | ICD-10-CM

## 2022-02-27 NOTE — Progress Notes (Signed)
PROGRESS NOTE ? ?Patient Name: Olivia [name]  ?DOB: 1966-09-28  ?MRN: [id number]  ?PCP: Leamon Arnt, MD  ? ?Date of Service: 02/27/2022  ?Start:  2:00 PM ?End:   2:55 PM ? ? ?Annual Review: 05/31/2022 ? ?Diagnosis ?F90.0 (Attention-deficit/hyperactivity disorder, Predominately inattentive presentation) ?F41.1 (Generalized anxiety disorder) ?F33.1 (Major Depressive Disorder, single episode, moderate) ? ?Symptoms:  ?autonomic hyperactivity (e.g., palpitations, shortness of breath, dry mouth, trouble swallowing, nausea, diarrhea).  ?Childhood history of Attention Deficit Disorder (ADD) that was either diagnosed or later concluded due to the symptoms of behavioral problems at school, impulsivity, temper outbursts, and lack of concentration.  ?Depressed or irritable mood. ?Diminished interest in or enjoyment of activities. ?Easily distracted and drawn from task at hand.  ?Excessive and/or unrealistic worry that is difficult to control occurring more days than not for at least 6 months about a number of events or activities.  ?Feelings of hopelessness, worthlessness, or inappropriate guilt.  ?Lack of energy.   ?Poor concentration and indecisiveness.  ?Restless and fidgety; unable to be sedentary for more than a short time.  ? ?Medication Status ?compliance  ?Safety ?none  ?If Suicidal or Homicidal State Action Taken: unspecified  ?Current Risk: low ?Medications ?unspecified ?Objectives ?Related Problem: Develop healthy interpersonal relationships that lead to the alleviation and help prevent the relapse of depression. ?Description: Increasingly verbalize hopeful and positive statements regarding self, others, and the future. ?Target Date: 2022-05-31 ?Frequency: Weekly ?Modality: individual ?Progress: 0% ? ?Related Problem: Develop healthy interpersonal relationships that lead to the alleviation and help prevent the relapse of depression. ?Description: Learn and implement conflict resolution skills to resolve  interpersonal problems. ?Target Date: 2022-05-31 ?Frequency: Weekly ?Modality: individual ?Progress: 0% ? ?Related Problem: Develop healthy interpersonal relationships that lead to the alleviation and help prevent the relapse of depression. ?Description: Verbalize an understanding and resolution of current interpersonal problems. ?Target Date: 2022-05-31 ?Frequency: Weekly ?Modality: individual ?Progress: 0% ? ?Related Problem: Develop healthy interpersonal relationships that lead to the alleviation and help prevent the relapse of depression. ?Description: Learn and implement problem-solving and decision-making skills. ?Target Date: 2022-05-31 ?Frequency: Weekly ?Modality: individual ?Progress: 0% ?Planned Intervention: Encourage in the client the development of a positive problem orientation in which problems and solving them are viewed as a natural part of life and not something to be feared, despaired, or avoided.  ?Related Problem: Learn and implement coping skills that result in a reduction of anxiety and worry, and improved daily functioning. ?Description: Identify, challenge, and replace biased, fearful self-talk with positive, realistic, and empowering self-talk. ?Target Date: 2022-05-31 ?Frequency: Weekly ?Modality: individual ?Progress: 0% ? ?Related Problem: Learn and implement coping skills that result in a reduction of anxiety and worry, and improved daily functioning. ?Description: Learn and implement problem-solving strategies for realistically addressing worries. ?Target Date: 2022-05-31 ?Frequency: Weekly ?Modality: individual ?Progress: 0% ? ?Related Problem: Learn and implement coping skills that result in a reduction of anxiety and worry, and improved daily functioning. ?Description: Identify and engage in pleasant activities on a daily basis. ?Target Date: 2022-05-31 ?Frequency: Weekly ?Modality: individual ?Progress: 0% ? ?Related Problem: Learn and implement coping skills that result in a  reduction of anxiety and worry, and improved daily functioning. ?Description: Learn and implement personal and interpersonal skills to reduce anxiety and improve interpersonal relationships. ?Target Date: 2022-05-31 ?Frequency: Weekly ?Modality: individual ?Progress: 0% ? ?Related Problem: Learn and implement coping skills that result in a reduction of anxiety and worry, and improved daily functioning. ?Description: Maintain involvement in work, family, and social  activities. ?Target Date: 2022-05-31 ?Frequency: Weekly ?Modality: individual ?Progress: 0% ? ?Related Problem: Learn and implement coping skills that result in a reduction of anxiety and worry, and improved daily functioning. ?Description: Reestablish a consistent sleep-wake cycle. ?Target Date: 2022-05-31 ?Frequency: Weekly ?Modality: individual ?Progress: 0% ? ?Related Problem: Learn and implement coping skills that result in a reduction of anxiety and worry, and improved daily functioning. ?Description: Verbalize an understanding of the role that cognitive biases play in excessive irrational worry and persistent anxiety symptoms. ?Target Date: 2022-05-31 ?Frequency: Weekly ?Modality: individual ?Progress: 0% ? ?Related Problem: Learn and implement coping skills that result in a reduction of anxiety and worry, and improved daily functioning. ?Description: Complete a medical evaluation to assess for possible contribution of medical or substance-related conditions to the anxiety. ?Target Date: 2022-05-31 ?Frequency: Weekly ?Modality: individual ?Progress: 0% ? ?Related Problem: Learn and implement coping skills that result in a reduction of anxiety and worry, and improved daily functioning. ?Description: Cooperate with a medication evaluation by a physician. ?Target Date: 2022-05-31 ?Frequency: Weekly ?Modality: individual ?Progress: 0% ? ?Related Problem: Learn and implement coping skills that result in a reduction of anxiety and worry, and improved  daily functioning. ?Description: Learn and implement calming skills to reduce overall anxiety and manage anxiety symptoms. ?Target Date: 2022-05-31 ?Frequency: Weekly ?Modality: individual ?Progress: 0% ? ?Related Problem: Minimize ADD behavioral interference in daily life. ?Description: Learn and implement organization and planning skills. ?Target Date: 2022-05-31 ?Frequency: Weekly ?Modality: individual ?Progress: 0% ?Planned Intervention: Teach the client organization and planning skills including the routine use of a calendar and daily task list.  ?Related Problem: Minimize ADD behavioral interference in daily life. ?Description: Learn and implement skills to reduce the disruptive influence of distractibility. ?Target Date: 2022-05-31 ?Frequency: Weekly ?Modality: individual ?Progress: 0% ? ?Related Problem: Minimize ADD behavioral interference in daily life. ?Description: Combine skills learned in therapy into a new daily approach to managing ADHD. ?Target Date: 2022-05-31 ?Frequency: Weekly ?Modality: individual ?Progress: 0% ? ?Related Problem: Minimize ADD behavioral interference in daily life. ?Description: Increase knowledge of ADHD and its treatment. ?Target Date: 2022-05-31 ?Frequency: Weekly ?Modality: individual ?Progress: 0% ? ?Related Problem: Minimize ADD behavioral interference in daily life. ?Description: Cooperate with and complete a psychiatric evaluation. ?Target Date: 2022-05-31 ?Frequency: Weekly ?Modality: individual ?Progress: 0% ? ?Related Problem: Minimize ADD behavioral interference in daily life. ?Description: Comply with all recommendations based on the psychiatric and/or psychological evaluations. ?Target Date: 2022-05-31 ?Frequency: Weekly ?Modality: individual ?Progress: 0% ? ?Related Problem: Minimize ADD behavioral interference in daily life. ?Description: Describe past and present experiences with ADD including its effects on functioning. ?Target Date: 2022-05-31 ?Frequency:  Weekly ?Modality: individual ?Progress: 0% ? ?Client Response ?full compliance  ?Service Location ?Location, [address] [place], [place] [address]  ?Service Code ?cpt 69794I (95)  ?Organizational Skills, Time Manage

## 2022-03-05 ENCOUNTER — Ambulatory Visit (INDEPENDENT_AMBULATORY_CARE_PROVIDER_SITE_OTHER): Payer: No Typology Code available for payment source | Admitting: Psychology

## 2022-03-05 DIAGNOSIS — F320 Major depressive disorder, single episode, mild: Secondary | ICD-10-CM | POA: Diagnosis not present

## 2022-03-05 NOTE — Progress Notes (Signed)
?PROGRESS NOTE ? ?Patient Name: Olivia [name]  ?DOB: 1966/07/20  ?MRN: [id number]  ?PCP: Leamon Arnt, MD  ? ?Date of Service: 03/05/2022  ?Start:  2:00 PM ?End:   2:55 PM ? ? ?Annual Review: 05/31/2022 ? ?Diagnosis ?F90.0 (Attention-deficit/hyperactivity disorder, Predominately inattentive presentation) ?F41.1 (Generalized anxiety disorder) ?F33.1 (Major Depressive Disorder, single episode, moderate) ? ?Symptoms:  ?autonomic hyperactivity (e.g., palpitations, shortness of breath, dry mouth, trouble swallowing, nausea, diarrhea).  ?Childhood history of Attention Deficit Disorder (ADD) that was either diagnosed or later concluded due to the symptoms of behavioral problems at school, impulsivity, temper outbursts, and lack of concentration.  ?Depressed or irritable mood. ?Diminished interest in or enjoyment of activities. ?Easily distracted and drawn from task at hand.  ?Excessive and/or unrealistic worry that is difficult to control occurring more days than not for at least 6 months about a number of events or activities.  ?Feelings of hopelessness, worthlessness, or inappropriate guilt.  ?Lack of energy.   ?Poor concentration and indecisiveness.  ?Restless and fidgety; unable to be sedentary for more than a short time.  ? ?Medication Status ?compliance  ?Safety ?none  ?If Suicidal or Homicidal State Action Taken: unspecified  ?Current Risk: low ?Medications ?unspecified ?Objectives ?Related Problem: Develop healthy interpersonal relationships that lead to the alleviation and help prevent the relapse of depression. ?Description: Increasingly verbalize hopeful and positive statements regarding self, others, and the future. ?Target Date: 2022-05-31 ?Frequency: Weekly ?Modality: individual ?Progress: 0% ? ?Related Problem: Develop healthy interpersonal relationships that lead to the alleviation and help prevent the relapse of depression. ?Description: Learn and implement conflict resolution skills to resolve  interpersonal problems. ?Target Date: 2022-05-31 ?Frequency: Weekly ?Modality: individual ?Progress: 0% ? ?Related Problem: Develop healthy interpersonal relationships that lead to the alleviation and help prevent the relapse of depression. ?Description: Verbalize an understanding and resolution of current interpersonal problems. ?Target Date: 2022-05-31 ?Frequency: Weekly ?Modality: individual ?Progress: 0% ? ?Related Problem: Develop healthy interpersonal relationships that lead to the alleviation and help prevent the relapse of depression. ?Description: Learn and implement problem-solving and decision-making skills. ?Target Date: 2022-05-31 ?Frequency: Weekly ?Modality: individual ?Progress: 0% ?Planned Intervention: Encourage in the client the development of a positive problem orientation in which problems and solving them are viewed as a natural part of life and not something to be feared, despaired, or avoided.  ?Related Problem: Learn and implement coping skills that result in a reduction of anxiety and worry, and improved daily functioning. ?Description: Identify, challenge, and replace biased, fearful self-talk with positive, realistic, and empowering self-talk. ?Target Date: 2022-05-31 ?Frequency: Weekly ?Modality: individual ?Progress: 0% ? ?Related Problem: Learn and implement coping skills that result in a reduction of anxiety and worry, and improved daily functioning. ?Description: Learn and implement problem-solving strategies for realistically addressing worries. ?Target Date: 2022-05-31 ?Frequency: Weekly ?Modality: individual ?Progress: 0% ? ?Related Problem: Learn and implement coping skills that result in a reduction of anxiety and worry, and improved daily functioning. ?Description: Identify and engage in pleasant activities on a daily basis. ?Target Date: 2022-05-31 ?Frequency: Weekly ?Modality: individual ?Progress: 0% ? ?Related Problem: Learn and implement coping skills that result in a  reduction of anxiety and worry, and improved daily functioning. ?Description: Learn and implement personal and interpersonal skills to reduce anxiety and improve interpersonal relationships. ?Target Date: 2022-05-31 ?Frequency: Weekly ?Modality: individual ?Progress: 0% ? ?Related Problem: Learn and implement coping skills that result in a reduction of anxiety and worry, and improved daily functioning. ?Description: Maintain involvement in work, family, and social  activities. ?Target Date: 2022-05-31 ?Frequency: Weekly ?Modality: individual ?Progress: 0% ? ?Related Problem: Learn and implement coping skills that result in a reduction of anxiety and worry, and improved daily functioning. ?Description: Reestablish a consistent sleep-wake cycle. ?Target Date: 2022-05-31 ?Frequency: Weekly ?Modality: individual ?Progress: 0% ? ?Related Problem: Learn and implement coping skills that result in a reduction of anxiety and worry, and improved daily functioning. ?Description: Verbalize an understanding of the role that cognitive biases play in excessive irrational worry and persistent anxiety symptoms. ?Target Date: 2022-05-31 ?Frequency: Weekly ?Modality: individual ?Progress: 0% ? ?Related Problem: Learn and implement coping skills that result in a reduction of anxiety and worry, and improved daily functioning. ?Description: Complete a medical evaluation to assess for possible contribution of medical or substance-related conditions to the anxiety. ?Target Date: 2022-05-31 ?Frequency: Weekly ?Modality: individual ?Progress: 0% ? ?Related Problem: Learn and implement coping skills that result in a reduction of anxiety and worry, and improved daily functioning. ?Description: Cooperate with a medication evaluation by a physician. ?Target Date: 2022-05-31 ?Frequency: Weekly ?Modality: individual ?Progress: 0% ? ?Related Problem: Learn and implement coping skills that result in a reduction of anxiety and worry, and improved  daily functioning. ?Description: Learn and implement calming skills to reduce overall anxiety and manage anxiety symptoms. ?Target Date: 2022-05-31 ?Frequency: Weekly ?Modality: individual ?Progress: 0% ? ?Related Problem: Minimize ADD behavioral interference in daily life. ?Description: Learn and implement organization and planning skills. ?Target Date: 2022-05-31 ?Frequency: Weekly ?Modality: individual ?Progress: 0% ?Planned Intervention: Teach the client organization and planning skills including the routine use of a calendar and daily task list.  ?Related Problem: Minimize ADD behavioral interference in daily life. ?Description: Learn and implement skills to reduce the disruptive influence of distractibility. ?Target Date: 2022-05-31 ?Frequency: Weekly ?Modality: individual ?Progress: 0% ? ?Related Problem: Minimize ADD behavioral interference in daily life. ?Description: Combine skills learned in therapy into a new daily approach to managing ADHD. ?Target Date: 2022-05-31 ?Frequency: Weekly ?Modality: individual ?Progress: 0% ? ?Related Problem: Minimize ADD behavioral interference in daily life. ?Description: Increase knowledge of ADHD and its treatment. ?Target Date: 2022-05-31 ?Frequency: Weekly ?Modality: individual ?Progress: 0% ? ?Related Problem: Minimize ADD behavioral interference in daily life. ?Description: Cooperate with and complete a psychiatric evaluation. ?Target Date: 2022-05-31 ?Frequency: Weekly ?Modality: individual ?Progress: 0% ? ?Related Problem: Minimize ADD behavioral interference in daily life. ?Description: Comply with all recommendations based on the psychiatric and/or psychological evaluations. ?Target Date: 2022-05-31 ?Frequency: Weekly ?Modality: individual ?Progress: 0% ? ?Related Problem: Minimize ADD behavioral interference in daily life. ?Description: Describe past and present experiences with ADD including its effects on functioning. ?Target Date: 2022-05-31 ?Frequency:  Weekly ?Modality: individual ?Progress: 0% ? ?Client Response ?full compliance  ?Service Location ?Location, [address] [place],  [address]  ?Service Code ?cpt 70786L (95)  ?Organizational Skills, Time [employer]

## 2022-03-12 ENCOUNTER — Ambulatory Visit (INDEPENDENT_AMBULATORY_CARE_PROVIDER_SITE_OTHER): Payer: No Typology Code available for payment source | Admitting: Psychology

## 2022-03-12 ENCOUNTER — Other Ambulatory Visit (HOSPITAL_BASED_OUTPATIENT_CLINIC_OR_DEPARTMENT_OTHER): Payer: Self-pay

## 2022-03-12 ENCOUNTER — Other Ambulatory Visit: Payer: Self-pay | Admitting: Family Medicine

## 2022-03-12 DIAGNOSIS — F321 Major depressive disorder, single episode, moderate: Secondary | ICD-10-CM | POA: Diagnosis not present

## 2022-03-12 MED ORDER — ESCITALOPRAM OXALATE 20 MG PO TABS
20.0000 mg | ORAL_TABLET | Freq: Every day | ORAL | 1 refills | Status: DC
  Filled 2022-03-12: qty 90, 90d supply, fill #0
  Filled 2022-06-17: qty 90, 90d supply, fill #1

## 2022-03-12 NOTE — Progress Notes (Signed)
?PROGRESS NOTE ? ?Patient Name: Olivia [name]  ?DOB: February 13, 1966  ?MRN: [id number]  ?PCP: Leamon Arnt, MD  ? ?Date of Service: 03/12/2022  ?Start:  2:00 PM ?End:   2:55 PM ? ? ?Annual Review: 05/31/2022 ? ?Diagnosis ?F90.0 (Attention-deficit/hyperactivity disorder, Predominately inattentive presentation) ?F41.1 (Generalized anxiety disorder) ?F33.1 (Major Depressive Disorder, single episode, moderate) ? ?Symptoms:  ?autonomic hyperactivity (e.g., palpitations, shortness of breath, dry mouth, trouble swallowing, nausea, diarrhea).  ?Childhood history of Attention Deficit Disorder (ADD) that was either diagnosed or later concluded due to the symptoms of behavioral problems at school, impulsivity, temper outbursts, and lack of concentration.  ?Depressed or irritable mood. ?Diminished interest in or enjoyment of activities. ?Easily distracted and drawn from task at hand.  ?Excessive and/or unrealistic worry that is difficult to control occurring more days than not for at least 6 months about a number of events or activities.  ?Feelings of hopelessness, worthlessness, or inappropriate guilt.  ?Lack of energy.   ?Poor concentration and indecisiveness.  ?Restless and fidgety; unable to be sedentary for more than a short time.  ? ?Medication Status ?compliance  ?Safety ?none  ?If Suicidal or Homicidal State Action Taken: unspecified  ?Current Risk: low ?Medications ?unspecified ?Objectives ?Related Problem: Develop healthy interpersonal relationships that lead to the alleviation and help prevent the relapse of depression. ?Description: Increasingly verbalize hopeful and positive statements regarding self, others, and the future. ?Target Date: 2022-05-31 ?Frequency: Weekly ?Modality: individual ?Progress: 0% ? ?Related Problem: Develop healthy interpersonal relationships that lead to the alleviation and help prevent the relapse of depression. ?Description: Learn and implement conflict resolution skills to resolve  interpersonal problems. ?Target Date: 2022-05-31 ?Frequency: Weekly ?Modality: individual ?Progress: 0% ? ?Related Problem: Develop healthy interpersonal relationships that lead to the alleviation and help prevent the relapse of depression. ?Description: Verbalize an understanding and resolution of current interpersonal problems. ?Target Date: 2022-05-31 ?Frequency: Weekly ?Modality: individual ?Progress: 0% ? ?Related Problem: Develop healthy interpersonal relationships that lead to the alleviation and help prevent the relapse of depression. ?Description: Learn and implement problem-solving and decision-making skills. ?Target Date: 2022-05-31 ?Frequency: Weekly ?Modality: individual ?Progress: 0% ?Planned Intervention: Encourage in the client the development of a positive problem orientation in which problems and solving them are viewed as a natural part of life and not something to be feared, despaired, or avoided.  ?Related Problem: Learn and implement coping skills that result in a reduction of anxiety and worry, and improved daily functioning. ?Description: Identify, challenge, and replace biased, fearful self-talk with positive, realistic, and empowering self-talk. ?Target Date: 2022-05-31 ?Frequency: Weekly ?Modality: individual ?Progress: 0% ? ?Related Problem: Learn and implement coping skills that result in a reduction of anxiety and worry, and improved daily functioning. ?Description: Learn and implement problem-solving strategies for realistically addressing worries. ?Target Date: 2022-05-31 ?Frequency: Weekly ?Modality: individual ?Progress: 0% ? ?Related Problem: Learn and implement coping skills that result in a reduction of anxiety and worry, and improved daily functioning. ?Description: Identify and engage in pleasant activities on a daily basis. ?Target Date: 2022-05-31 ?Frequency: Weekly ?Modality: individual ?Progress: 0% ? ?Related Problem: Learn and implement coping skills that result in a  reduction of anxiety and worry, and improved daily functioning. ?Description: Learn and implement personal and interpersonal skills to reduce anxiety and improve interpersonal relationships. ?Target Date: 2022-05-31 ?Frequency: Weekly ?Modality: individual ?Progress: 0% ? ?Related Problem: Learn and implement coping skills that result in a reduction of anxiety and worry, and improved daily functioning. ?Description: Maintain involvement in work, family, and social  activities. ?Target Date: 2022-05-31 ?Frequency: Weekly ?Modality: individual ?Progress: 0% ? ?Related Problem: Learn and implement coping skills that result in a reduction of anxiety and worry, and improved daily functioning. ?Description: Reestablish a consistent sleep-wake cycle. ?Target Date: 2022-05-31 ?Frequency: Weekly ?Modality: individual ?Progress: 0% ? ?Related Problem: Learn and implement coping skills that result in a reduction of anxiety and worry, and improved daily functioning. ?Description: Verbalize an understanding of the role that cognitive biases play in excessive irrational worry and persistent anxiety symptoms. ?Target Date: 2022-05-31 ?Frequency: Weekly ?Modality: individual ?Progress: 0% ? ?Related Problem: Learn and implement coping skills that result in a reduction of anxiety and worry, and improved daily functioning. ?Description: Complete a medical evaluation to assess for possible contribution of medical or substance-related conditions to the anxiety. ?Target Date: 2022-05-31 ?Frequency: Weekly ?Modality: individual ?Progress: 0% ? ?Related Problem: Learn and implement coping skills that result in a reduction of anxiety and worry, and improved daily functioning. ?Description: Cooperate with a medication evaluation by a physician. ?Target Date: 2022-05-31 ?Frequency: Weekly ?Modality: individual ?Progress: 0% ? ?Related Problem: Learn and implement coping skills that result in a reduction of anxiety and worry, and improved  daily functioning. ?Description: Learn and implement calming skills to reduce overall anxiety and manage anxiety symptoms. ?Target Date: 2022-05-31 ?Frequency: Weekly ?Modality: individual ?Progress: 0% ? ?Related Problem: Minimize ADD behavioral interference in daily life. ?Description: Learn and implement organization and planning skills. ?Target Date: 2022-05-31 ?Frequency: Weekly ?Modality: individual ?Progress: 0% ?Planned Intervention: Teach the client organization and planning skills including the routine use of a calendar and daily task list.  ?Related Problem: Minimize ADD behavioral interference in daily life. ?Description: Learn and implement skills to reduce the disruptive influence of distractibility. ?Target Date: 2022-05-31 ?Frequency: Weekly ?Modality: individual ?Progress: 0% ? ?Related Problem: Minimize ADD behavioral interference in daily life. ?Description: Combine skills learned in therapy into a new daily approach to managing ADHD. ?Target Date: 2022-05-31 ?Frequency: Weekly ?Modality: individual ?Progress: 0% ? ?Related Problem: Minimize ADD behavioral interference in daily life. ?Description: Increase knowledge of ADHD and its treatment. ?Target Date: 2022-05-31 ?Frequency: Weekly ?Modality: individual ?Progress: 0% ? ?Related Problem: Minimize ADD behavioral interference in daily life. ?Description: Cooperate with and complete a psychiatric evaluation. ?Target Date: 2022-05-31 ?Frequency: Weekly ?Modality: individual ?Progress: 0% ? ?Related Problem: Minimize ADD behavioral interference in daily life. ?Description: Comply with all recommendations based on the psychiatric and/or psychological evaluations. ?Target Date: 2022-05-31 ?Frequency: Weekly ?Modality: individual ?Progress: 0% ? ?Related Problem: Minimize ADD behavioral interference in daily life. ?Description: Describe past and present experiences with ADD including its effects on functioning. ?Target Date: 2022-05-31 ?Frequency:  Weekly ?Modality: individual ?Progress: 0% ? ?Client Response ?full compliance  ?Service Location ?Location, [address] [place], [place] [address]  ?Service Code ?cpt 64332R (95)  ?Organizational Skills, Time [employer]

## 2022-03-19 ENCOUNTER — Ambulatory Visit: Payer: No Typology Code available for payment source | Admitting: Psychology

## 2022-03-26 ENCOUNTER — Ambulatory Visit: Payer: No Typology Code available for payment source | Admitting: Psychology

## 2022-04-02 ENCOUNTER — Ambulatory Visit (INDEPENDENT_AMBULATORY_CARE_PROVIDER_SITE_OTHER): Payer: No Typology Code available for payment source | Admitting: Psychology

## 2022-04-02 DIAGNOSIS — F411 Generalized anxiety disorder: Secondary | ICD-10-CM | POA: Diagnosis not present

## 2022-04-02 NOTE — Progress Notes (Signed)
PROGRESS NOTE  Patient Name: Olivia [name]  DOB: 10/02/1966  MRN: [id number]  PCP: Leamon Arnt, MD   Date of Service: 04/02/2022  Start:  4:00 PM End:   4:55 PM   Annual Review: 05/31/2022  Diagnosis F90.0 (Attention-deficit/hyperactivity disorder, Predominately inattentive presentation) F41.1 (Generalized anxiety disorder) F33.1 (Major Depressive Disorder, single episode, moderate)  Symptoms:  autonomic hyperactivity (e.g., palpitations, shortness of breath, dry mouth, trouble swallowing, nausea, diarrhea).  Childhood history of Attention Deficit Disorder (ADD) that was either diagnosed or later concluded due to the symptoms of behavioral problems at school, impulsivity, temper outbursts, and lack of concentration.  Depressed or irritable mood. Diminished interest in or enjoyment of activities. Easily distracted and drawn from task at hand.  Excessive and/or unrealistic worry that is difficult to control occurring more days than not for at least 6 months about a number of events or activities.  Feelings of hopelessness, worthlessness, or inappropriate guilt.  Lack of energy.   Poor concentration and indecisiveness.  Restless and fidgety; unable to be sedentary for more than a short time.   Medication Status compliance  Safety none  If Suicidal or Homicidal State Action Taken: unspecified  Current Risk: low Medications unspecified Objectives Related Problem: Develop healthy interpersonal relationships that lead to the alleviation and help prevent the relapse of depression. Description: Increasingly verbalize hopeful and positive statements regarding self, others, and the future. Target Date: 2022-05-31 Frequency: Weekly Modality: individual Progress: 0%  Related Problem: Develop healthy interpersonal relationships that lead to the alleviation and help prevent the relapse of depression. Description: Learn and implement conflict resolution skills to resolve  interpersonal problems. Target Date: 2022-05-31 Frequency: Weekly Modality: individual Progress: 0%  Related Problem: Develop healthy interpersonal relationships that lead to the alleviation and help prevent the relapse of depression. Description: Verbalize an understanding and resolution of current interpersonal problems. Target Date: 2022-05-31 Frequency: Weekly Modality: individual Progress: 0%  Related Problem: Develop healthy interpersonal relationships that lead to the alleviation and help prevent the relapse of depression. Description: Learn and implement problem-solving and decision-making skills. Target Date: 2022-05-31 Frequency: Weekly Modality: individual Progress: 0% Planned Intervention: Encourage in the client the development of a positive problem orientation in which problems and solving them are viewed as a natural part of life and not something to be feared, despaired, or avoided.  Related Problem: Learn and implement coping skills that result in a reduction of anxiety and worry, and improved daily functioning. Description: Identify, challenge, and replace biased, fearful self-talk with positive, realistic, and empowering self-talk. Target Date: 2022-05-31 Frequency: Weekly Modality: individual Progress: 0%  Related Problem: Learn and implement coping skills that result in a reduction of anxiety and worry, and improved daily functioning. Description: Learn and implement problem-solving strategies for realistically addressing worries. Target Date: 2022-05-31 Frequency: Weekly Modality: individual Progress: 0%  Related Problem: Learn and implement coping skills that result in a reduction of anxiety and worry, and improved daily functioning. Description: Identify and engage in pleasant activities on a daily basis. Target Date: 2022-05-31 Frequency: Weekly Modality: individual Progress: 0%  Related Problem: Learn and implement coping skills that result in a  reduction of anxiety and worry, and improved daily functioning. Description: Learn and implement personal and interpersonal skills to reduce anxiety and improve interpersonal relationships. Target Date: 2022-05-31 Frequency: Weekly Modality: individual Progress: 0%  Related Problem: Learn and implement coping skills that result in a reduction of anxiety and worry, and improved daily functioning. Description: Maintain involvement in work, family, and social  activities. Target Date: 2022-05-31 Frequency: Weekly Modality: individual Progress: 0%  Related Problem: Learn and implement coping skills that result in a reduction of anxiety and worry, and improved daily functioning. Description: Reestablish a consistent sleep-wake cycle. Target Date: 2022-05-31 Frequency: Weekly Modality: individual Progress: 0%  Related Problem: Learn and implement coping skills that result in a reduction of anxiety and worry, and improved daily functioning. Description: Verbalize an understanding of the role that cognitive biases play in excessive irrational worry and persistent anxiety symptoms. Target Date: 2022-05-31 Frequency: Weekly Modality: individual Progress: 0%  Related Problem: Learn and implement coping skills that result in a reduction of anxiety and worry, and improved daily functioning. Description: Complete a medical evaluation to assess for possible contribution of medical or substance-related conditions to the anxiety. Target Date: 2022-05-31 Frequency: Weekly Modality: individual Progress: 0%  Related Problem: Learn and implement coping skills that result in a reduction of anxiety and worry, and improved daily functioning. Description: Cooperate with a medication evaluation by a physician. Target Date: 2022-05-31 Frequency: Weekly Modality: individual Progress: 0%  Related Problem: Learn and implement coping skills that result in a reduction of anxiety and worry, and improved  daily functioning. Description: Learn and implement calming skills to reduce overall anxiety and manage anxiety symptoms. Target Date: 2022-05-31 Frequency: Weekly Modality: individual Progress: 0%  Related Problem: Minimize ADD behavioral interference in daily life. Description: Learn and implement organization and planning skills. Target Date: 2022-05-31 Frequency: Weekly Modality: individual Progress: 0% Planned Intervention: Teach the client organization and planning skills including the routine use of a calendar and daily task list.  Related Problem: Minimize ADD behavioral interference in daily life. Description: Learn and implement skills to reduce the disruptive influence of distractibility. Target Date: 2022-05-31 Frequency: Weekly Modality: individual Progress: 0%  Related Problem: Minimize ADD behavioral interference in daily life. Description: Combine skills learned in therapy into a new daily approach to managing ADHD. Target Date: 2022-05-31 Frequency: Weekly Modality: individual Progress: 0%  Related Problem: Minimize ADD behavioral interference in daily life. Description: Increase knowledge of ADHD and its treatment. Target Date: 2022-05-31 Frequency: Weekly Modality: individual Progress: 0%  Related Problem: Minimize ADD behavioral interference in daily life. Description: Cooperate with and complete a psychiatric evaluation. Target Date: 2022-05-31 Frequency: Weekly Modality: individual Progress: 0%  Related Problem: Minimize ADD behavioral interference in daily life. Description: Comply with all recommendations based on the psychiatric and/or psychological evaluations. Target Date: 2022-05-31 Frequency: Weekly Modality: individual Progress: 0%  Related Problem: Minimize ADD behavioral interference in daily life. Description: Describe past and present experiences with ADD including its effects on functioning. Target Date: 2022-05-31 Frequency:  Weekly Modality: individual Progress: 0%  Client Response full compliance  Service Location Location, [address] [place], [place] [address]  Service Code cpt [id number])  Organizational Skills, Time Management Skills  Identify automatic thoughts  Rationally challenge thoughts or beliefs/cognitive restructuring  Normalize/Reframe  Identify/label emotions  Validate/empathize  Assign therapy homework  Review therapy homework  Identified an insight  Lifestyle change (exercise, nutrition)  Comments    Today I met with  [name] in remote video (WebEx) face-to-face individual psychotherapy as an accomodation to the COVID-19 Pandemic.  Distance Site: [name]: Dr Jannifer Franklin Remote Office Consent: Obtained verbal consent to transmit  session remotely    Depression Rating: 4 Anxiety Rating: 4-5   [name] reports that her trip wasn't the experience she was hoping for but it was an adventure.  We d/e/p the experience,  what worked, what didn't work and what surprised her.  I noted how her self talk either helped or made things worse.  We d/ how to use her CBT skills to keep things going in the right direction.  Lastly, [name] was surprised that she wasn't more fatigued during the trip.  I provided some psycho education on the production of dopamine and it's effect on initiation, motivation and mood.   Home Practice: read resources provided thorough [employer]   Progress: patient is making progress learning and implementing new coping strategies to manage her mood states aeb use of simplified to do list, delegating tasks, using coping statements to address cognitive distortions    [name], PhD                 [name], PhD

## 2022-04-09 ENCOUNTER — Ambulatory Visit (INDEPENDENT_AMBULATORY_CARE_PROVIDER_SITE_OTHER): Payer: No Typology Code available for payment source | Admitting: Psychology

## 2022-04-09 DIAGNOSIS — F320 Major depressive disorder, single episode, mild: Secondary | ICD-10-CM | POA: Diagnosis not present

## 2022-04-09 NOTE — Progress Notes (Signed)
PROGRESS NOTE  Patient Name: Olivia [name]  DOB: [birth year]  MRN: [id number]  PCP: Leamon Arnt, MD   Date of Service: 04/09/2022  Start:  4:00 PM End:   4:55 PM   Annual Review: 05/31/2022  Diagnosis F90.0 (Attention-deficit/hyperactivity disorder, Predominately inattentive presentation) F41.1 (Generalized anxiety disorder) F33.1 (Major Depressive Disorder, single episode, moderate)  Symptoms:  autonomic hyperactivity (e.g., palpitations, shortness of breath, dry mouth, trouble swallowing, nausea, diarrhea).  Childhood history of Attention Deficit Disorder (ADD) that was either diagnosed or later concluded due to the symptoms of behavioral problems at school, impulsivity, temper outbursts, and lack of concentration.  Depressed or irritable mood. Diminished interest in or enjoyment of activities. Easily distracted and drawn from task at hand.  Excessive and/or unrealistic worry that is difficult to control occurring more days than not for at least 6 months about a number of events or activities.  Feelings of hopelessness, worthlessness, or inappropriate guilt.  Lack of energy.   Poor concentration and indecisiveness.  Restless and fidgety; unable to be sedentary for more than a short time.   Medication Status compliance  Safety none  If Suicidal or Homicidal State Action Taken: unspecified  Current Risk: low Medications unspecified Objectives Related Problem: Develop healthy interpersonal relationships that lead to the alleviation and help prevent the relapse of depression. Description: Increasingly verbalize hopeful and positive statements regarding self, others, and the future. Target Date: 2022-05-31 Frequency: Weekly Modality: individual Progress: 0%  Related Problem: Develop healthy interpersonal relationships that lead to the alleviation and help prevent the relapse of depression. Description: Learn and implement conflict resolution skills to resolve  interpersonal problems. Target Date: 2022-05-31 Frequency: Weekly Modality: individual Progress: 0%  Related Problem: Develop healthy interpersonal relationships that lead to the alleviation and help prevent the relapse of depression. Description: Verbalize an understanding and resolution of current interpersonal problems. Target Date: 2022-05-31 Frequency: Weekly Modality: individual Progress: 0%  Related Problem: Develop healthy interpersonal relationships that lead to the alleviation and help prevent the relapse of depression. Description: Learn and implement problem-solving and decision-making skills. Target Date: 2022-05-31 Frequency: Weekly Modality: individual Progress: 0% Planned Intervention: Encourage in the client the development of a positive problem orientation in which problems and solving them are viewed as a natural part of life and not something to be feared, despaired, or avoided.  Related Problem: Learn and implement coping skills that result in a reduction of anxiety and worry, and improved daily functioning. Description: Identify, challenge, and replace biased, fearful self-talk with positive, realistic, and empowering self-talk. Target Date: 2022-05-31 Frequency: Weekly Modality: individual Progress: 0%  Related Problem: Learn and implement coping skills that result in a reduction of anxiety and worry, and improved daily functioning. Description: Learn and implement problem-solving strategies for realistically addressing worries. Target Date: 2022-05-31 Frequency: Weekly Modality: individual Progress: 0%  Related Problem: Learn and implement coping skills that result in a reduction of anxiety and worry, and improved daily functioning. Description: Identify and engage in pleasant activities on a daily basis. Target Date: 2022-05-31 Frequency: Weekly Modality: individual Progress: 0%  Related Problem: Learn and implement coping skills that result in a  reduction of anxiety and worry, and improved daily functioning. Description: Learn and implement personal and interpersonal skills to reduce anxiety and improve interpersonal relationships. Target Date: 2022-05-31 Frequency: Weekly Modality: individual Progress: 0%  Related Problem: Learn and implement coping skills that result in a reduction of anxiety and worry, and improved daily functioning. Description: Maintain involvement in work, family, and social  activities. Target Date: 2022-05-31 Frequency: Weekly Modality: individual Progress: 0%  Related Problem: Learn and implement coping skills that result in a reduction of anxiety and worry, and improved daily functioning. Description: Reestablish a consistent sleep-wake cycle. Target Date: 2022-05-31 Frequency: Weekly Modality: individual Progress: 0%  Related Problem: Learn and implement coping skills that result in a reduction of anxiety and worry, and improved daily functioning. Description: Verbalize an understanding of the role that cognitive biases play in excessive irrational worry and persistent anxiety symptoms. Target Date: 2022-05-31 Frequency: Weekly Modality: individual Progress: 0%  Related Problem: Learn and implement coping skills that result in a reduction of anxiety and worry, and improved daily functioning. Description: Complete a medical evaluation to assess for possible contribution of medical or substance-related conditions to the anxiety. Target Date: 2022-05-31 Frequency: Weekly Modality: individual Progress: 0%  Related Problem: Learn and implement coping skills that result in a reduction of anxiety and worry, and improved daily functioning. Description: Cooperate with a medication evaluation by a physician. Target Date: 2022-05-31 Frequency: Weekly Modality: individual Progress: 0%  Related Problem: Learn and implement coping skills that result in a reduction of anxiety and worry, and improved  daily functioning. Description: Learn and implement calming skills to reduce overall anxiety and manage anxiety symptoms. Target Date: 2022-05-31 Frequency: Weekly Modality: individual Progress: 0%  Related Problem: Minimize ADD behavioral interference in daily life. Description: Learn and implement organization and planning skills. Target Date: 2022-05-31 Frequency: Weekly Modality: individual Progress: 0% Planned Intervention: Teach the client organization and planning skills including the routine use of a calendar and daily task list.  Related Problem: Minimize ADD behavioral interference in daily life. Description: Learn and implement skills to reduce the disruptive influence of distractibility. Target Date: 2022-05-31 Frequency: Weekly Modality: individual Progress: 0%  Related Problem: Minimize ADD behavioral interference in daily life. Description: Combine skills learned in therapy into a new daily approach to managing ADHD. Target Date: 2022-05-31 Frequency: Weekly Modality: individual Progress: 0%  Related Problem: Minimize ADD behavioral interference in daily life. Description: Increase knowledge of ADHD and its treatment. Target Date: 2022-05-31 Frequency: Weekly Modality: individual Progress: 0%  Related Problem: Minimize ADD behavioral interference in daily life. Description: Cooperate with and complete a psychiatric evaluation. Target Date: 2022-05-31 Frequency: Weekly Modality: individual Progress: 0%  Related Problem: Minimize ADD behavioral interference in daily life. Description: Comply with all recommendations based on the psychiatric and/or psychological evaluations. Target Date: 2022-05-31 Frequency: Weekly Modality: individual Progress: 0%  Related Problem: Minimize ADD behavioral interference in daily life. Description: Describe past and present experiences with ADD including its effects on functioning. Target Date: 2022-05-31 Frequency:  Weekly Modality: individual Progress: 0%  Client Response full compliance  Service Location Location, [address] [place], [place] [address]  Service Code cpt [phone])  Organizational Skills, Time Management Skills  Identify automatic thoughts  Rationally challenge thoughts or beliefs/cognitive restructuring  Normalize/Reframe  Identify/label emotions  Validate/empathize  Assign therapy homework  Review therapy homework  Identified an insight  Lifestyle change (exercise, nutrition)  Comments    Today I met with  [name] in remote video (WebEx) face-to-face individual psychotherapy as an accomodation to the COVID-19 Pandemic.  Distance Site: [name]: Dr Jannifer Franklin Remote Office Consent: Obtained verbal consent to transmit  session remotely    Depression Rating: 4 Anxiety Rating: 4-5   [name] reports that she had a rough weekend covering for staff and hasn't been able to reverse her sleep schedule.  We  labeled this as temporary and that she is doing what she needs to do.  [name] states that she is planning to work out with her son.  We d/ the benefits of working out on her mood states and sleep quality.  I encouraged her to keep a habit tracker to help with motivation.  I renewed some psycho education on the production of dopamine and it's effect on initiation, motivation and mood.    Home Practice:  create habit trackers in her bullet journal   Progress: patient is making progress learning and implementing new coping strategies to manage her mood states aeb use of simplified to do list, delegating tasks, using coping statements to address cognitive distortions     [name], PhD

## 2022-04-16 ENCOUNTER — Ambulatory Visit: Payer: No Typology Code available for payment source | Admitting: Psychology

## 2022-04-23 ENCOUNTER — Ambulatory Visit (INDEPENDENT_AMBULATORY_CARE_PROVIDER_SITE_OTHER): Payer: No Typology Code available for payment source | Admitting: Psychology

## 2022-04-23 DIAGNOSIS — F320 Major depressive disorder, single episode, mild: Secondary | ICD-10-CM | POA: Diagnosis not present

## 2022-04-23 NOTE — Progress Notes (Signed)
PROGRESS NOTE  Patient Name: Olivia [name]  DOB: [birth year]  MRN: [id number]  PCP: Leamon Arnt, MD   Date of Service: 04/09/2022  Start:  4:00 PM End:   4:55 PM   Annual Review: 05/31/2022  Diagnosis F90.0 (Attention-deficit/hyperactivity disorder, Predominately inattentive presentation) F41.1 (Generalized anxiety disorder) F33.1 (Major Depressive Disorder, single episode, moderate)  Symptoms:  autonomic hyperactivity (e.g., palpitations, shortness of breath, dry mouth, trouble swallowing, nausea, diarrhea).  Childhood history of Attention Deficit Disorder (ADD) that was either diagnosed or later concluded due to the symptoms of behavioral problems at school, impulsivity, temper outbursts, and lack of concentration.  Depressed or irritable mood. Diminished interest in or enjoyment of activities. Easily distracted and drawn from task at hand.  Excessive and/or unrealistic worry that is difficult to control occurring more days than not for at least 6 months about a number of events or activities.  Feelings of hopelessness, worthlessness, or inappropriate guilt.  Lack of energy.   Poor concentration and indecisiveness.  Restless and fidgety; unable to be sedentary for more than a short time.   Medication Status compliance  Safety none  If Suicidal or Homicidal State Action Taken: unspecified  Current Risk: low Medications unspecified Objectives Related Problem: Develop healthy interpersonal relationships that lead to the alleviation and help prevent the relapse of depression. Description: Increasingly verbalize hopeful and positive statements regarding self, others, and the future. Target Date: 2022-05-31 Frequency: Weekly Modality: individual Progress: 0%  Related Problem: Develop healthy interpersonal relationships that lead to the alleviation and help prevent the relapse of depression. Description: Learn and implement conflict resolution skills to resolve  interpersonal problems. Target Date: 2022-05-31 Frequency: Weekly Modality: individual Progress: 0%  Related Problem: Develop healthy interpersonal relationships that lead to the alleviation and help prevent the relapse of depression. Description: Verbalize an understanding and resolution of current interpersonal problems. Target Date: 2022-05-31 Frequency: Weekly Modality: individual Progress: 0%  Related Problem: Develop healthy interpersonal relationships that lead to the alleviation and help prevent the relapse of depression. Description: Learn and implement problem-solving and decision-making skills. Target Date: 2022-05-31 Frequency: Weekly Modality: individual Progress: 0% Planned Intervention: Encourage in the client the development of a positive problem orientation in which problems and solving them are viewed as a natural part of life and not something to be feared, despaired, or avoided.  Related Problem: Learn and implement coping skills that result in a reduction of anxiety and worry, and improved daily functioning. Description: Identify, challenge, and replace biased, fearful self-talk with positive, realistic, and empowering self-talk. Target Date: 2022-05-31 Frequency: Weekly Modality: individual Progress: 0%  Related Problem: Learn and implement coping skills that result in a reduction of anxiety and worry, and improved daily functioning. Description: Learn and implement problem-solving strategies for realistically addressing worries. Target Date: 2022-05-31 Frequency: Weekly Modality: individual Progress: 0%  Related Problem: Learn and implement coping skills that result in a reduction of anxiety and worry, and improved daily functioning. Description: Identify and engage in pleasant activities on a daily basis. Target Date: 2022-05-31 Frequency: Weekly Modality: individual Progress: 0%  Related Problem: Learn and implement coping skills that result in a  reduction of anxiety and worry, and improved daily functioning. Description: Learn and implement personal and interpersonal skills to reduce anxiety and improve interpersonal relationships. Target Date: 2022-05-31 Frequency: Weekly Modality: individual Progress: 0%  Related Problem: Learn and implement coping skills that result in a reduction of anxiety and worry, and improved daily functioning. Description: Maintain involvement in work, family, and social  activities. Target Date: 2022-05-31 Frequency: Weekly Modality: individual Progress: 0%  Related Problem: Learn and implement coping skills that result in a reduction of anxiety and worry, and improved daily functioning. Description: Reestablish a consistent sleep-wake cycle. Target Date: 2022-05-31 Frequency: Weekly Modality: individual Progress: 0%  Related Problem: Learn and implement coping skills that result in a reduction of anxiety and worry, and improved daily functioning. Description: Verbalize an understanding of the role that cognitive biases play in excessive irrational worry and persistent anxiety symptoms. Target Date: 2022-05-31 Frequency: Weekly Modality: individual Progress: 0%  Related Problem: Learn and implement coping skills that result in a reduction of anxiety and worry, and improved daily functioning. Description: Complete a medical evaluation to assess for possible contribution of medical or substance-related conditions to the anxiety. Target Date: 2022-05-31 Frequency: Weekly Modality: individual Progress: 0%  Related Problem: Learn and implement coping skills that result in a reduction of anxiety and worry, and improved daily functioning. Description: Cooperate with a medication evaluation by a physician. Target Date: 2022-05-31 Frequency: Weekly Modality: individual Progress: 0%  Related Problem: Learn and implement coping skills that result in a reduction of anxiety and worry, and improved  daily functioning. Description: Learn and implement calming skills to reduce overall anxiety and manage anxiety symptoms. Target Date: 2022-05-31 Frequency: Weekly Modality: individual Progress: 0%  Related Problem: Minimize ADD behavioral interference in daily life. Description: Learn and implement organization and planning skills. Target Date: 2022-05-31 Frequency: Weekly Modality: individual Progress: 0% Planned Intervention: Teach the client organization and planning skills including the routine use of a calendar and daily task list.  Related Problem: Minimize ADD behavioral interference in daily life. Description: Learn and implement skills to reduce the disruptive influence of distractibility. Target Date: 2022-05-31 Frequency: Weekly Modality: individual Progress: 0%  Related Problem: Minimize ADD behavioral interference in daily life. Description: Combine skills learned in therapy into a new daily approach to managing ADHD. Target Date: 2022-05-31 Frequency: Weekly Modality: individual Progress: 0%  Related Problem: Minimize ADD behavioral interference in daily life. Description: Increase knowledge of ADHD and its treatment. Target Date: 2022-05-31 Frequency: Weekly Modality: individual Progress: 0%  Related Problem: Minimize ADD behavioral interference in daily life. Description: Cooperate with and complete a psychiatric evaluation. Target Date: 2022-05-31 Frequency: Weekly Modality: individual Progress: 0%  Related Problem: Minimize ADD behavioral interference in daily life. Description: Comply with all recommendations based on the psychiatric and/or psychological evaluations. Target Date: 2022-05-31 Frequency: Weekly Modality: individual Progress: 0%  Related Problem: Minimize ADD behavioral interference in daily life. Description: Describe past and present experiences with ADD including its effects on functioning. Target Date: 2022-05-31 Frequency:  Weekly Modality: individual Progress: 0%  Client Response full compliance  Service Location Location, [address] [place], [place] [address]  Service Code cpt [phone])  Organizational Skills, Time Management Skills  Identify automatic thoughts  Rationally challenge thoughts or beliefs/cognitive restructuring  Normalize/Reframe  Identify/label emotions  Validate/empathize  Assign therapy homework  Review therapy homework  Identified an insight  Lifestyle change (exercise, nutrition)  Comments    Today I met with  Olivia [name] in remote video (WebEx) face-to-face individual psychotherapy as an accomodation to the COVID-19 Pandemic.  Distance Site: [name]: Dr Jannifer Franklin Remote Office Consent: Obtained verbal consent to transmit  session remotely    Depression Rating: 4 Anxiety Rating: 4-5   Olivia [name] reports that she had another rough weekend covering for staff and that she still hasn't been able to reverse her sleep  schedule.  In addition, her husband ended up in the ER.  We d/p all that happen in the intervening week and her plans to take a couple of days off of work to get re-regulated.  She identified that while she understands where things got off track, she tried to not fall into the same pattern of negative self talk.  We both agreed that this was an improvement.  Home Practice:  create habit trackers in her bullet journal   Progress: patient is making progress learning and implementing new coping strategies to manage her mood states aeb use of simplified to do list, delegating tasks, using coping statements to address cognitive distortions     [name], PhD         [name], PhD

## 2022-04-30 ENCOUNTER — Ambulatory Visit (INDEPENDENT_AMBULATORY_CARE_PROVIDER_SITE_OTHER): Payer: No Typology Code available for payment source | Admitting: Psychology

## 2022-04-30 DIAGNOSIS — F320 Major depressive disorder, single episode, mild: Secondary | ICD-10-CM | POA: Diagnosis not present

## 2022-04-30 NOTE — Progress Notes (Signed)
PROGRESS NOTE  Patient Name: Olivia [name]  DOB: 03/31/1966  MRN: [id number]  PCP: Willow Ora, MD   Date of Service: 04/30/2022  Start:  4:00 PM End:   4:55 PM   Annual Review: 05/31/2022  Diagnosis F90.0 (Attention-deficit/hyperactivity disorder, Predominately inattentive presentation) F41.1 (Generalized anxiety disorder) F33.1 (Major Depressive Disorder, single episode, moderate)  Symptoms:  autonomic hyperactivity (e.g., palpitations, shortness of breath, dry mouth, trouble swallowing, nausea, diarrhea).  Childhood history of Attention Deficit Disorder (ADD) that was either diagnosed or later concluded due to the symptoms of behavioral problems at school, impulsivity, temper outbursts, and lack of concentration.  Depressed or irritable mood. Diminished interest in or enjoyment of activities. Easily distracted and drawn from task at hand.  Excessive and/or unrealistic worry that is difficult to control occurring more days than not for at least 6 months about a number of events or activities.  Feelings of hopelessness, worthlessness, or inappropriate guilt.  Lack of energy.   Poor concentration and indecisiveness.  Restless and fidgety; unable to be sedentary for more than a short time.   Medication Status compliance  Safety none  If Suicidal or Homicidal State Action Taken: unspecified  Current Risk: low Medications unspecified Objectives Related Problem: Develop healthy interpersonal relationships that lead to the alleviation and help prevent the relapse of depression. Description: Increasingly verbalize hopeful and positive statements regarding self, others, and the future. Target Date: 2022-05-31 Frequency: Weekly Modality: individual Progress: 0%  Related Problem: Develop healthy interpersonal relationships that lead to the alleviation and help prevent the relapse of depression. Description: Learn and implement conflict resolution skills to resolve  interpersonal problems. Target Date: 2022-05-31 Frequency: Weekly Modality: individual Progress: 0%  Related Problem: Develop healthy interpersonal relationships that lead to the alleviation and help prevent the relapse of depression. Description: Verbalize an understanding and resolution of current interpersonal problems. Target Date: 2022-05-31 Frequency: Weekly Modality: individual Progress: 0%  Related Problem: Develop healthy interpersonal relationships that lead to the alleviation and help prevent the relapse of depression. Description: Learn and implement problem-solving and decision-making skills. Target Date: 2022-05-31 Frequency: Weekly Modality: individual Progress: 0% Planned Intervention: Encourage in the client the development of a positive problem orientation in which problems and solving them are viewed as a natural part of life and not something to be feared, despaired, or avoided.  Related Problem: Learn and implement coping skills that result in a reduction of anxiety and worry, and improved daily functioning. Description: Identify, challenge, and replace biased, fearful self-talk with positive, realistic, and empowering self-talk. Target Date: 2022-05-31 Frequency: Weekly Modality: individual Progress: 0%  Related Problem: Learn and implement coping skills that result in a reduction of anxiety and worry, and improved daily functioning. Description: Learn and implement problem-solving strategies for realistically addressing worries. Target Date: 2022-05-31 Frequency: Weekly Modality: individual Progress: 0%  Related Problem: Learn and implement coping skills that result in a reduction of anxiety and worry, and improved daily functioning. Description: Identify and engage in pleasant activities on a daily basis. Target Date: 2022-05-31 Frequency: Weekly Modality: individual Progress: 0%  Related Problem: Learn and implement coping skills that result in a  reduction of anxiety and worry, and improved daily functioning. Description: Learn and implement personal and interpersonal skills to reduce anxiety and improve interpersonal relationships. Target Date: 2022-05-31 Frequency: Weekly Modality: individual Progress: 0%  Related Problem: Learn and implement coping skills that result in a reduction of anxiety and worry, and improved daily functioning. Description: Maintain involvement in work, family, and social  activities. Target Date: 2022-05-31 Frequency: Weekly Modality: individual Progress: 0%  Related Problem: Learn and implement coping skills that result in a reduction of anxiety and worry, and improved daily functioning. Description: Reestablish a consistent sleep-wake cycle. Target Date: 2022-05-31 Frequency: Weekly Modality: individual Progress: 0%  Related Problem: Learn and implement coping skills that result in a reduction of anxiety and worry, and improved daily functioning. Description: Verbalize an understanding of the role that cognitive biases play in excessive irrational worry and persistent anxiety symptoms. Target Date: 2022-05-31 Frequency: Weekly Modality: individual Progress: 0%  Related Problem: Learn and implement coping skills that result in a reduction of anxiety and worry, and improved daily functioning. Description: Complete a medical evaluation to assess for possible contribution of medical or substance-related conditions to the anxiety. Target Date: 2022-05-31 Frequency: Weekly Modality: individual Progress: 0%  Related Problem: Learn and implement coping skills that result in a reduction of anxiety and worry, and improved daily functioning. Description: Cooperate with a medication evaluation by a physician. Target Date: 2022-05-31 Frequency: Weekly Modality: individual Progress: 0%  Related Problem: Learn and implement coping skills that result in a reduction of anxiety and worry, and improved  daily functioning. Description: Learn and implement calming skills to reduce overall anxiety and manage anxiety symptoms. Target Date: 2022-05-31 Frequency: Weekly Modality: individual Progress: 0%  Related Problem: Minimize ADD behavioral interference in daily life. Description: Learn and implement organization and planning skills. Target Date: 2022-05-31 Frequency: Weekly Modality: individual Progress: 0% Planned Intervention: Teach the client organization and planning skills including the routine use of a calendar and daily task list.  Related Problem: Minimize ADD behavioral interference in daily life. Description: Learn and implement skills to reduce the disruptive influence of distractibility. Target Date: 2022-05-31 Frequency: Weekly Modality: individual Progress: 0%  Related Problem: Minimize ADD behavioral interference in daily life. Description: Combine skills learned in therapy into a new daily approach to managing ADHD. Target Date: 2022-05-31 Frequency: Weekly Modality: individual Progress: 0%  Related Problem: Minimize ADD behavioral interference in daily life. Description: Increase knowledge of ADHD and its treatment. Target Date: 2022-05-31 Frequency: Weekly Modality: individual Progress: 0%  Related Problem: Minimize ADD behavioral interference in daily life. Description: Cooperate with and complete a psychiatric evaluation. Target Date: 2022-05-31 Frequency: Weekly Modality: individual Progress: 0%  Related Problem: Minimize ADD behavioral interference in daily life. Description: Comply with all recommendations based on the psychiatric and/or psychological evaluations. Target Date: 2022-05-31 Frequency: Weekly Modality: individual Progress: 0%  Related Problem: Minimize ADD behavioral interference in daily life. Description: Describe past and present experiences with ADD including its effects on functioning. Target Date: 2022-05-31 Frequency:  Weekly Modality: individual Progress: 0%  Client Response full compliance  Service Location Location, [address] [place], [place] [address]  Service Code cpt [phone])  Organizational Skills, Time Management Skills  Identify automatic thoughts  Rationally challenge thoughts or beliefs/cognitive restructuring  Normalize/Reframe  Identify/label emotions  Validate/empathize  Assign therapy homework  Review therapy homework  Identified an insight  Lifestyle change (exercise, nutrition)  Comments    Today I met with  [name] in remote video (WebEx) face-to-face individual psychotherapy.  Distance Site: [name]: Dr Odette Horns Remote Office Consent: Obtained verbal consent to transmit  session remotely    Depression Rating: 4 Anxiety Rating: 4-5   [name] reports that her one big concern is that his daughter's and her wife's birthday are coming soon.  Since her daughter has asked not to be contacted, she  is getting anxious about what to do.  We d/e/p how difficult it is to respect her request for no contact.  We came up with an alternative plan that will help to manage her feelings.  [name] has been working on [employer] data [employer] her [employer].  We d/ what plans and progress she's made and what she is looking forward to.     Home Practice:  create habit trackers in her bullet journal   Progress: patient is making progress learning and implementing new coping strategies to manage her mood states aeb use of simplified to do list, delegating tasks, using coping statements to address cognitive distortions     [name], PhD

## 2022-05-06 ENCOUNTER — Encounter: Payer: No Typology Code available for payment source | Admitting: Family Medicine

## 2022-05-14 ENCOUNTER — Ambulatory Visit (INDEPENDENT_AMBULATORY_CARE_PROVIDER_SITE_OTHER): Payer: No Typology Code available for payment source | Admitting: Psychology

## 2022-05-14 DIAGNOSIS — F320 Major depressive disorder, single episode, mild: Secondary | ICD-10-CM

## 2022-05-14 NOTE — Progress Notes (Signed)
PROGRESS NOTE  Patient Name: Olivia [name]  DOB: 05/09/1966  MRN: [id number]  PCP: Leamon Arnt, MD   Date of Service: 711/2023  Start:  4:00 PM End:   4:55 PM   Annual Review: 05/31/2022  Diagnosis F90.0 (Attention-deficit/hyperactivity disorder, Predominately inattentive presentation) F41.1 (Generalized anxiety disorder) F33.1 (Major Depressive Disorder, single episode, moderate)  Symptoms:  autonomic hyperactivity (e.g., palpitations, shortness of breath, dry mouth, trouble swallowing, nausea, diarrhea).  Childhood history of Attention Deficit Disorder (ADD) that was either diagnosed or later concluded due to the symptoms of behavioral problems at school, impulsivity, temper outbursts, and lack of concentration.  Depressed or irritable mood. Diminished interest in or enjoyment of activities. Easily distracted and drawn from task at hand.  Excessive and/or unrealistic worry that is difficult to control occurring more days than not for at least 6 months about a number of events or activities.  Feelings of hopelessness, worthlessness, or inappropriate guilt.  Lack of energy.   Poor concentration and indecisiveness.  Restless and fidgety; unable to be sedentary for more than a short time.   Medication Status compliance  Safety none  If Suicidal or Homicidal State Action Taken: unspecified  Current Risk: low Medications unspecified Objectives Related Problem: Develop healthy interpersonal relationships that lead to the alleviation and help prevent the relapse of depression. Description: Increasingly verbalize hopeful and positive statements regarding self, others, and the future. Target Date: 2022-05-31 Frequency: Weekly Modality: individual Progress: 0%  Related Problem: Develop healthy interpersonal relationships that lead to the alleviation and help prevent the relapse of depression. Description: Learn and implement conflict resolution skills to resolve  interpersonal problems. Target Date: 2022-05-31 Frequency: Weekly Modality: individual Progress: 0%  Related Problem: Develop healthy interpersonal relationships that lead to the alleviation and help prevent the relapse of depression. Description: Verbalize an understanding and resolution of current interpersonal problems. Target Date: 2022-05-31 Frequency: Weekly Modality: individual Progress: 0%  Related Problem: Develop healthy interpersonal relationships that lead to the alleviation and help prevent the relapse of depression. Description: Learn and implement problem-solving and decision-making skills. Target Date: 2022-05-31 Frequency: Weekly Modality: individual Progress: 0% Planned Intervention: Encourage in the client the development of a positive problem orientation in which problems and solving them are viewed as a natural part of life and not something to be feared, despaired, or avoided.  Related Problem: Learn and implement coping skills that result in a reduction of anxiety and worry, and improved daily functioning. Description: Identify, challenge, and replace biased, fearful self-talk with positive, realistic, and empowering self-talk. Target Date: 2022-05-31 Frequency: Weekly Modality: individual Progress: 0%  Related Problem: Learn and implement coping skills that result in a reduction of anxiety and worry, and improved daily functioning. Description: Learn and implement problem-solving strategies for realistically addressing worries. Target Date: 2022-05-31 Frequency: Weekly Modality: individual Progress: 0%  Related Problem: Learn and implement coping skills that result in a reduction of anxiety and worry, and improved daily functioning. Description: Identify and engage in pleasant activities on a daily basis. Target Date: 2022-05-31 Frequency: Weekly Modality: individual Progress: 0%  Related Problem: Learn and implement coping skills that result in a  reduction of anxiety and worry, and improved daily functioning. Description: Learn and implement personal and interpersonal skills to reduce anxiety and improve interpersonal relationships. Target Date: 2022-05-31 Frequency: Weekly Modality: individual Progress: 0%  Related Problem: Learn and implement coping skills that result in a reduction of anxiety and worry, and improved daily functioning. Description: Maintain involvement in work, family, and social  activities. Target Date: 2022-05-31 Frequency: Weekly Modality: individual Progress: 0%  Related Problem: Learn and implement coping skills that result in a reduction of anxiety and worry, and improved daily functioning. Description: Reestablish a consistent sleep-wake cycle. Target Date: 2022-05-31 Frequency: Weekly Modality: individual Progress: 0%  Related Problem: Learn and implement coping skills that result in a reduction of anxiety and worry, and improved daily functioning. Description: Verbalize an understanding of the role that cognitive biases play in excessive irrational worry and persistent anxiety symptoms. Target Date: 2022-05-31 Frequency: Weekly Modality: individual Progress: 0%  Related Problem: Learn and implement coping skills that result in a reduction of anxiety and worry, and improved daily functioning. Description: Complete a medical evaluation to assess for possible contribution of medical or substance-related conditions to the anxiety. Target Date: 2022-05-31 Frequency: Weekly Modality: individual Progress: 0%  Related Problem: Learn and implement coping skills that result in a reduction of anxiety and worry, and improved daily functioning. Description: Cooperate with a medication evaluation by a physician. Target Date: 2022-05-31 Frequency: Weekly Modality: individual Progress: 0%  Related Problem: Learn and implement coping skills that result in a reduction of anxiety and worry, and improved  daily functioning. Description: Learn and implement calming skills to reduce overall anxiety and manage anxiety symptoms. Target Date: 2022-05-31 Frequency: Weekly Modality: individual Progress: 0%  Related Problem: Minimize ADD behavioral interference in daily life. Description: Learn and implement organization and planning skills. Target Date: 2022-05-31 Frequency: Weekly Modality: individual Progress: 0% Planned Intervention: Teach the client organization and planning skills including the routine use of a calendar and daily task list.  Related Problem: Minimize ADD behavioral interference in daily life. Description: Learn and implement skills to reduce the disruptive influence of distractibility. Target Date: 2022-05-31 Frequency: Weekly Modality: individual Progress: 0%  Related Problem: Minimize ADD behavioral interference in daily life. Description: Combine skills learned in therapy into a new daily approach to managing ADHD. Target Date: 2022-05-31 Frequency: Weekly Modality: individual Progress: 0%  Related Problem: Minimize ADD behavioral interference in daily life. Description: Increase knowledge of ADHD and its treatment. Target Date: 2022-05-31 Frequency: Weekly Modality: individual Progress: 0%  Related Problem: Minimize ADD behavioral interference in daily life. Description: Cooperate with and complete a psychiatric evaluation. Target Date: 2022-05-31 Frequency: Weekly Modality: individual Progress: 0%  Related Problem: Minimize ADD behavioral interference in daily life. Description: Comply with all recommendations based on the psychiatric and/or psychological evaluations. Target Date: 2022-05-31 Frequency: Weekly Modality: individual Progress: 0%  Related Problem: Minimize ADD behavioral interference in daily life. Description: Describe past and present experiences with ADD including its effects on functioning. Target Date: 2022-05-31 Frequency:  Weekly Modality: individual Progress: 0%  Client Response full compliance  Service Location Location, [address] [place], [place] [address]  Service Code cpt [phone])  Organizational Skills, Time Management Skills  Identify automatic thoughts  Rationally challenge thoughts or beliefs/cognitive restructuring  Normalize/Reframe  Identify/label emotions  Validate/empathize  Assign therapy homework  Review therapy homework  Identified an insight  Lifestyle change (exercise, nutrition)  Comments    Today I met with  [name] in remote video (WebEx) face-to-face individual psychotherapy.  Distance Site: [name]: Dr Jannifer Franklin Remote Office Consent: Obtained verbal consent to transmit  session remotely    Depression Rating: 4 Anxiety Rating: 3-4   [name] reports that she didn't reach out to her daughter on her birthday as we d/ in our last session.  We d/p her feelings of sadness and loss.  She shared that she went out with a few friends from work and was able to enjoy herself for the first time in a very long time.  We d/ a number of other insights she gained from the experience.    Home Practice:  create habit trackers in her bullet journal   Progress: patient is making progress learning and implementing new coping strategies to manage her mood states aeb use of simplified to do list, delegating tasks, using coping statements to address cognitive distortions     [name], PhD

## 2022-05-15 ENCOUNTER — Other Ambulatory Visit (HOSPITAL_BASED_OUTPATIENT_CLINIC_OR_DEPARTMENT_OTHER): Payer: Self-pay

## 2022-05-16 ENCOUNTER — Other Ambulatory Visit (HOSPITAL_BASED_OUTPATIENT_CLINIC_OR_DEPARTMENT_OTHER): Payer: Self-pay

## 2022-05-16 MED ORDER — LEVOTHYROXINE SODIUM 112 MCG PO TABS: 112.0000 ug | ORAL_TABLET | Freq: Every day | ORAL | 2 refills | Status: DC

## 2022-05-20 ENCOUNTER — Other Ambulatory Visit (HOSPITAL_BASED_OUTPATIENT_CLINIC_OR_DEPARTMENT_OTHER): Payer: Self-pay

## 2022-05-21 ENCOUNTER — Other Ambulatory Visit (HOSPITAL_BASED_OUTPATIENT_CLINIC_OR_DEPARTMENT_OTHER): Payer: Self-pay

## 2022-05-21 ENCOUNTER — Ambulatory Visit (INDEPENDENT_AMBULATORY_CARE_PROVIDER_SITE_OTHER): Payer: No Typology Code available for payment source | Admitting: Psychology

## 2022-05-21 ENCOUNTER — Encounter (HOSPITAL_BASED_OUTPATIENT_CLINIC_OR_DEPARTMENT_OTHER): Payer: Self-pay

## 2022-05-21 DIAGNOSIS — F320 Major depressive disorder, single episode, mild: Secondary | ICD-10-CM | POA: Diagnosis not present

## 2022-05-21 MED ORDER — LIOTHYRONINE SODIUM 25 MCG PO TABS
ORAL_TABLET | ORAL | 0 refills | Status: DC
  Filled 2022-05-21: qty 90, 90d supply, fill #0

## 2022-05-21 NOTE — Progress Notes (Signed)
PROGRESS NOTE   Patient Name: BEAU VANDUZER  DOB: 07-10-1966  MRN: [id number]  PCP: Leamon Arnt, MD   Date of Service: 05/21/2022  Start:  4:00 PM End:   4:55 PM   Annual Review: 05/31/2022  Diagnosis F90.0 (Attention-deficit/hyperactivity disorder, Predominately inattentive presentation) F41.1 (Generalized anxiety disorder) F33.1 (Major Depressive Disorder, single episode, moderate)  Symptoms:  autonomic hyperactivity (e.g., palpitations, shortness of breath, dry mouth, trouble swallowing, nausea, diarrhea).  Childhood history of Attention Deficit Disorder (ADD) that was either diagnosed or later concluded due to the symptoms of behavioral problems at school, impulsivity, temper outbursts, and lack of concentration.  Depressed or irritable mood. Diminished interest in or enjoyment of activities. Easily distracted and drawn from task at hand.  Excessive and/or unrealistic worry that is difficult to control occurring more days than not for at least 6 months about a number of events or activities.  Feelings of hopelessness, worthlessness, or inappropriate guilt.  Lack of energy.   Poor concentration and indecisiveness.  Restless and fidgety; unable to be sedentary for more than a short time.   Medication Status compliance  Safety none  If Suicidal or Homicidal State Action Taken: unspecified  Current Risk: low Medications unspecified Objectives Related Problem: Develop healthy interpersonal relationships that lead to the alleviation and help prevent the relapse of depression. Description: Increasingly verbalize hopeful and positive statements regarding self, others, and the future. Target Date: 2022-05-31 Frequency: Weekly Modality: individual Progress: 0%  Related Problem: Develop healthy interpersonal relationships that lead to the alleviation and help prevent the relapse of depression. Description: Learn and implement conflict resolution skills to resolve  interpersonal problems. Target Date: 2022-05-31 Frequency: Weekly Modality: individual Progress: 0%  Related Problem: Develop healthy interpersonal relationships that lead to the alleviation and help prevent the relapse of depression. Description: Verbalize an understanding and resolution of current interpersonal problems. Target Date: 2022-05-31 Frequency: Weekly Modality: individual Progress: 0%  Related Problem: Develop healthy interpersonal relationships that lead to the alleviation and help prevent the relapse of depression. Description: Learn and implement problem-solving and decision-making skills. Target Date: 2022-05-31 Frequency: Weekly Modality: individual Progress: 0% Planned Intervention: Encourage in the client the development of a positive problem orientation in which problems and solving them are viewed as a natural part of life and not something to be feared, despaired, or avoided.  Related Problem: Learn and implement coping skills that result in a reduction of anxiety and worry, and improved daily functioning. Description: Identify, challenge, and replace biased, fearful self-talk with positive, realistic, and empowering self-talk. Target Date: 2022-05-31 Frequency: Weekly Modality: individual Progress: 0%  Related Problem: Learn and implement coping skills that result in a reduction of anxiety and worry, and improved daily functioning. Description: Learn and implement problem-solving strategies for realistically addressing worries. Target Date: 2022-05-31 Frequency: Weekly Modality: individual Progress: 0%  Related Problem: Learn and implement coping skills that result in a reduction of anxiety and worry, and improved daily functioning. Description: Identify and engage in pleasant activities on a daily basis. Target Date: 2022-05-31 Frequency: Weekly Modality: individual Progress: 0%  Related Problem: Learn and implement coping skills that result in a  reduction of anxiety and worry, and improved daily functioning. Description: Learn and implement personal and interpersonal skills to reduce anxiety and improve interpersonal relationships. Target Date: 2022-05-31 Frequency: Weekly Modality: individual Progress: 0%  Related Problem: Learn and implement coping skills that result in a reduction of anxiety and worry, and improved daily functioning. Description: Maintain involvement in work, family, and  social activities. Target Date: 2022-05-31 Frequency: Weekly Modality: individual Progress: 0%  Related Problem: Learn and implement coping skills that result in a reduction of anxiety and worry, and improved daily functioning. Description: Reestablish a consistent sleep-wake cycle. Target Date: 2022-05-31 Frequency: Weekly Modality: individual Progress: 0%  Related Problem: Learn and implement coping skills that result in a reduction of anxiety and worry, and improved daily functioning. Description: Verbalize an understanding of the role that cognitive biases play in excessive irrational worry and persistent anxiety symptoms. Target Date: 2022-05-31 Frequency: Weekly Modality: individual Progress: 0%  Related Problem: Learn and implement coping skills that result in a reduction of anxiety and worry, and improved daily functioning. Description: Complete a medical evaluation to assess for possible contribution of medical or substance-related conditions to the anxiety. Target Date: 2022-05-31 Frequency: Weekly Modality: individual Progress: 0%  Related Problem: Learn and implement coping skills that result in a reduction of anxiety and worry, and improved daily functioning. Description: Cooperate with a medication evaluation by a physician. Target Date: 2022-05-31 Frequency: Weekly Modality: individual Progress: 0%  Related Problem: Learn and implement coping skills that result in a reduction of anxiety and worry, and improved  daily functioning. Description: Learn and implement calming skills to reduce overall anxiety and manage anxiety symptoms. Target Date: 2022-05-31 Frequency: Weekly Modality: individual Progress: 0%  Related Problem: Minimize ADD behavioral interference in daily life. Description: Learn and implement organization and planning skills. Target Date: 2022-05-31 Frequency: Weekly Modality: individual Progress: 0% Planned Intervention: Teach the client organization and planning skills including the routine use of a calendar and daily task list.  Related Problem: Minimize ADD behavioral interference in daily life. Description: Learn and implement skills to reduce the disruptive influence of distractibility. Target Date: 2022-05-31 Frequency: Weekly Modality: individual Progress: 0%  Related Problem: Minimize ADD behavioral interference in daily life. Description: Combine skills learned in therapy into a new daily approach to managing ADHD. Target Date: 2022-05-31 Frequency: Weekly Modality: individual Progress: 0%  Related Problem: Minimize ADD behavioral interference in daily life. Description: Increase knowledge of ADHD and its treatment. Target Date: 2022-05-31 Frequency: Weekly Modality: individual Progress: 0%  Related Problem: Minimize ADD behavioral interference in daily life. Description: Cooperate with and complete a psychiatric evaluation. Target Date: 2022-05-31 Frequency: Weekly Modality: individual Progress: 0%  Related Problem: Minimize ADD behavioral interference in daily life. Description: Comply with all recommendations based on the psychiatric and/or psychological evaluations. Target Date: 2022-05-31 Frequency: Weekly Modality: individual Progress: 0%  Related Problem: Minimize ADD behavioral interference in daily life. Description: Describe past and present experiences with ADD including its effects on functioning. Target Date: 2022-05-31 Frequency:  Weekly Modality: individual Progress: 0%  Client Response full compliance  Service Location Location, [address] [place], [place] [address]  Service Code cpt [phone])  Organizational Skills, Time Management Skills  Identify automatic thoughts  Rationally challenge thoughts or beliefs/cognitive restructuring  Normalize/Reframe  Identify/label emotions  Validate/empathize  Assign therapy homework  Review therapy homework  Identified an insight  Lifestyle change (exercise, nutrition)  Comments    Today I met with  [name] in remote video (WebEx) face-to-face individual psychotherapy.  Distance Site: [name]: Dr Jannifer Franklin Remote Office Consent: Obtained verbal consent to transmit  session remotely    Depression Rating: 4 Anxiety Rating: 3-4   [name] reports that she focused on better sleep hygiene this week.  She has had minor success.  She states that one night she took Trazodone and then  a muscle relaxer.  She had racing thoughts and then couldn't go to sleep.  I conducted a mini assessment and I concluded that she wasn't manic.  I encouraged her to observe if she has a different reaction when she only takes the Trazodone or the muscle relaxer.  [name] states that she finds herself avoiding a return call to an important person.  We d/e/p why she avoids and finds it hard to say NO especially to important people.  We then created a script for respectfully say NO at this time and then practiced.  I also helped her to acknowledge that this is where she is right now but 6 mos or a year from now she might be able to take on new projects.     Home Practice:  create habit trackers in her bullet journal   Progress: patient is making progress learning and implementing new coping strategies to manage her mood states aeb use of simplified to do list, delegating tasks, using coping statements to address cognitive distortions     [name],  PhD

## 2022-05-22 ENCOUNTER — Other Ambulatory Visit (HOSPITAL_BASED_OUTPATIENT_CLINIC_OR_DEPARTMENT_OTHER): Payer: Self-pay

## 2022-05-28 ENCOUNTER — Ambulatory Visit (INDEPENDENT_AMBULATORY_CARE_PROVIDER_SITE_OTHER): Payer: No Typology Code available for payment source | Admitting: Psychology

## 2022-05-28 DIAGNOSIS — F411 Generalized anxiety disorder: Secondary | ICD-10-CM | POA: Diagnosis not present

## 2022-05-28 DIAGNOSIS — F320 Major depressive disorder, single episode, mild: Secondary | ICD-10-CM | POA: Diagnosis not present

## 2022-05-28 DIAGNOSIS — F900 Attention-deficit hyperactivity disorder, predominantly inattentive type: Secondary | ICD-10-CM | POA: Diagnosis not present

## 2022-05-28 NOTE — Progress Notes (Addendum)
PROGRESS NOTE: Annual review   Name: Olivia [name] Date: 06/04/2022 MRN: [id number] DOB: December 03, 1965 PCP: Leamon Arnt, MD  Time spent: 3:00 - 4:00 PM  Annual Review: 05/29/2023  Today I met with  Olivia [name] in remote video (WebEx) face-to-face individual psychotherapy.  Distance Site: [name]: Dr Jannifer Franklin Remote Office Consent: Obtained verbal consent to transmit  session remotely   In session today we conducted patient's annual review.  We d/ progress, reviewed goals and updated her treatment plan.  Olivia [name] actively participated in the creation of her treatment plan and gave her consent.  Reason for Visit /Presenting Problem:   Following the discovery of a parathyroid tumor her life has changed dramatically. She is normally a high functioning and driven individual and it is a struggle to get motivated to do anything. If it is necessary for work she does it and does a good job, but it is not the same. She has gained 70 pounds in less than 9 months and it has impacted her self image. She is also peri menopausal and these hormonal changes are also likely to be contributing to her changing mood states.   A year and a half ago her father had quadrupel by-pass surgery and he was recently diagnosed with cancer. 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  Elveria states that she was diagnosed with adult ADHD and responded well to medications.     Mental Status Exam: Appearance:   Casual     Behavior:  Appropriate  Motor:  Normal  Speech/Language:   NA  Affect:  Appropriate  Mood:  normal  Thought process:  normal  Thought content:    WNL  Sensory/Perceptual disturbances:    WNL  Orientation:  oriented to person, place, time/date, situation, and day of week  Attention:  Good  Concentration:  Good  Memory:  WNL  Fund of knowledge:   Good  Insight:    Good  Judgment:   Good  Impulse Control:  Good    Risk  Assessment: Danger to Self:  No Self-injurious Behavior: No Danger to Others: No Duty to Warn:no Physical Aggression / Violence:No  Substance Abuse History: Current substance abuse: No     Past Psychiatric History:   No previous psychological problems have been observed Outpatient Providers: Current therapist History of Psych Hospitalization: No  Psychological Testing: Attention/ADHD:  ?    Abuse History:  Victim of: No.,  n/a    Report needed: No. Victim of Neglect:No. Witness / Exposure to Domestic Violence: No   Protective Services Involvement: No  Witness to [employer] Violence:  No   Family History:  Family History  Problem Relation Age of Onset   Diabetes Mother    Obesity Mother    Colon polyps Mother    Colon cancer Mother    Obesity Father    Hypertension Father    Obesity Sister     Living situation: the patient lives with their family  Sexual Orientation: Straight  Relationship Status: married  Name of spouse / other: Lanny Hurst (52) he is a Publishing copy and works for [employer], [employer]. He is worried about her and is doing an amazing job of taking care of her.   Olivia [name] was married early. She married her first husband and was a "scum bag." She quit school and was a single mom raising a child on her own. 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)   Son: [name] (17) he is on the ASD, he attends [employer]: spouse friends parents  Museum/gallery curator Stress:  No   Income/Employment/Disability: Employment  Armed forces logistics/support/administrative officer: No   Educational History: Education: post Forensic psychologist work or Public house manager: N/a  Any cultural differences that may affect / interfere with treatment:  n/a  Recreation/Hobbies: music, crafting  Stressors: conflict with  daughter, work related stress  Strengths: Supportive Relationships, Family, Friends, Financial controller, Hopefulness, and Conservator, museum/gallery  Barriers:  none  Legal History: Pending legal issue / charges: The patient has no significant history of legal issues. History of legal issue / charges:  n/a  Medical History/Surgical History: reviewed Past Medical History:  Diagnosis Date   Anxiety    Asthma    Environmental allergies    Hiatal hernia    Hiatal hernia without gangrene or obstruction 08/19/2018   History of parathyroid surgery 2021 01/17/2021   For primary hyperparathyroidism. Dr. Harlow Asa   HLD (hyperlipidemia) 04/15/2008   Hypertension    Microcytic anemia    Migraine, hormonal    Multiple thyroid nodules 08/19/2018   2016 IMPRESSION: Multinodular goiter with multiple bilateral thyroid nodules. These are generally round and hypoechoic. There is a mildly complex cyst in the right lobe measuring up to 1.1 cm. The largest solid nodule is in the inferior right lobe measures 1.8 x 1.1 x 1.4 cm. This could either be sampled or followed by ultrasound.   PAC (premature atrial contraction)    PACs (premature atrial contraction)    Palpitations    PCOS (polycystic ovarian syndrome) 10/25/2014   PONV (postoperative nausea and vomiting)    Pre-diabetes    Reflux    Seasonal allergies     Past Surgical History:  Procedure Laterality Date   HIATAL HERNIA REPAIR  04/14/2012   Procedure: LAPAROSCOPIC REPAIR OF HIATAL HERNIA;  Surgeon: Pedro Earls, MD;  Location: [facility];  Service: General;  Laterality: N/A;  Hiatal Hernia Repair-Replacement of Lap Band   LAPAROSCOPIC GASTRIC BANDING  08/08/2008   PARATHYROIDECTOMY Left 06/02/2020   Procedure: LEFT INFERIOR PARATHYROIDECTOMY;  Surgeon: Armandina Gemma, MD;  Location: [facility];  Service: General;  Laterality: Left;   THYROIDECTOMY N/A 06/02/2020   Procedure: TOTAL THYROIDECTOMY;  Surgeon: Armandina Gemma, MD;  Location: [facility];  Service:  General;  Laterality: N/A;   TONSILLECTOMY  1981    Medications: Current Outpatient Medications  Medication Sig Dispense Refill   albuterol (PROVENTIL HFA) 108 (90 Base) MCG/ACT inhaler Inhale 2 puffs into the lungs every 6 (six) hours as needed for wheezing or shortness of breath. 18 g 2   albuterol (PROVENTIL) (2.5 MG/3ML) 0.083% nebulizer solution Take 3 mLs (2.5 mg total) by nebulization every 6 (six) hours as needed for wheezing or shortness of breath. 75 mL 12   ALPRAZolam (XANAX) 0.5 MG tablet Take 1 tablet (0.5 mg total) by mouth 2 (two) times daily as needed for anxiety. (Patient not taking: Reported on 11/06/2021) 30 tablet 0   buPROPion (WELLBUTRIN XL) 150 MG 24 hr tablet Take 1 tablet (150 mg total) by mouth daily. 90 tablet 3   cetirizine (ZYRTEC) 10 MG tablet Take 10 mg by mouth daily as needed for allergies.      diclofenac sodium (VOLTAREN) 1 % GEL Apply topically to affected area  qid (Patient taking differently: Apply 1 application topically 4 (four) times daily as needed (pain).) 100 g 1   escitalopram (LEXAPRO) 20 MG tablet Take 1 tablet (20 mg total) by mouth daily. 90 tablet 1   famotidine (PEPCID) 20 MG tablet Take 20 mg by mouth daily as needed for heartburn or indigestion. (Patient not taking: Reported on 11/06/2021)     ferrous sulfate 325 (65 FE) MG EC tablet Take 325 mg by mouth daily with breakfast.     levothyroxine (SYNTHROID) 112 MCG tablet Take 1 tablet (112 mcg total) by mouth daily. 90 tablet 2   liothyronine (CYTOMEL) 25 MCG tablet Take 1 tablet by mouth once daily. 90 tablet 0   magnesium 30 MG tablet Take 30 mg by mouth daily.     metoprolol tartrate (LOPRESSOR) 25 MG tablet Take 12.5 mg by mouth daily as needed. (Patient not taking: Reported on 11/06/2021) 60 tablet 1   Multiple Vitamin (MULITIVITAMIN WITH MINERALS) TABS Take 1 tablet by mouth daily.     mupirocin cream (BACTROBAN) 2 % Apply 1 application topically 2 (two) times daily. 15 g 0   nitroGLYCERIN  (NITRODUR - DOSED IN MG/24 HR) 0.2 mg/hr patch Apply 1/4 patch daily to tendon for tendonitis. 30 patch 1   Omega-3 Fatty Acids (FISH OIL) 1000 MG CAPS Take 1,000 mg by mouth daily. (Patient not taking: Reported on 11/06/2021)     rosuvastatin (CRESTOR) 10 MG tablet TAKE 1 TABLET (10 MG TOTAL) BY MOUTH DAILY. (Patient not taking: Reported on 11/06/2021) 90 tablet 3   SYNTHROID 112 MCG tablet 1 tablet in the morning on an empty stomach Orally Once a day 90 days 90 tablet 4   SYNTHROID 125 MCG tablet Take 1 tablet (125 mcg total) by mouth daily in the morning on an empty stomach. 30 tablet 11   Turmeric (QC TUMERIC COMPLEX PO) Take 1 capsule by mouth daily.     Vitamin D, Cholecalciferol, 50 MCG (2000 UT) CAPS Take 2,000 Units by mouth daily.     No current facility-administered medications for this visit.    Allergies  Allergen Reactions   Cephalosporins Anaphylaxis and Other (See Comments)    Caused hypotension and a fever   Eggs Or Egg-Derived Products Hives, Swelling and Other (See Comments)    Angioedema   Penicillins Anaphylaxis    Has patient had a PCN reaction causing immediate rash, facial/tongue/throat swelling, SOB or lightheadedness with hypotension: Yes Has patient had a PCN reaction causing severe rash involving mucus membranes or skin necrosis: No Has patient had a PCN reaction that required hospitalization: Observed X12 hrs Has patient had a PCN reaction occurring within the last 10 years: No If all of the above answers are "NO", then may proceed with Cephalosporin use.    Sulfa Drugs Cross Reactors Other (See Comments)    Liver failure   Adhesive [Tape] Hives and Itching    Cannot wear for any length of time   Effexor [Venlafaxine] Other (See Comments)    Lightheadedness, dizziness, and headaches resulted    Diagnoses:  Major depressive disorder, single episode, mild (HCC)  Generalized anxiety disorder  Attention deficit hyperactivity disorder (ADHD), predominantly  inattentive type   Individualized Treatment Plan                Strengths: bright, verbal, motivated, self aware, self advocate   Supports: spouse, family, friends, colleagues   Goal/Needs for Treatment:  In order of importance to patient  1) Develop healthy interpersonal  relationships that lead to the alleviation and help prevent the relapse of depression.  2) Learn and implement coping skills that result in a reduction of anxiety and worry, and improved functioning.   3) Combine skills learned in therapy into a new daily approach to managing ADHD.   Client Statement of Needs: requests assistance with piecing back together her life as it once was before she got depressed (burnt out).   Treatment Level: Weekly Individual Outpatient Psychotherapy.  Symptoms: autonomic hyperactivity (e.g., palpitations, shortness of breath, dry mouth, trouble swallowing, nausea, diarrhea).  Childhood history of Attention Deficit Disorder (ADD) that was either diagnosed or later concluded due to the symptoms of behavioral problems at school, impulsivity, temper outbursts, and lack of concentration.  Depressed or irritable mood. Diminished interest in or enjoyment of activities. Easily distracted and drawn from task at hand.  Excessive and/or unrealistic worry that is difficult to control occurring more days than not for at least 6 months about a number of events or activities.  Feelings of hopelessness, worthlessness, or inappropriate guilt.  Lack of energy.   Poor concentration and indecisiveness.  Restless and fidgety; unable to be sedentary for more than a short time.   Client Treatment Preferences: Continue with current therapist   Healthcare consumer's goal for treatment:  Psychologist, [name], Ph.D. will support the patient's ability to achieve the goals identified. Cognitive Behavioral Therapy, Dialectical Behavioral Therapy, Motivational Interviewing, SPACE, parent training, and other  evidenced-based practices will be used to promote progress towards healthy functioning.   Healthcare consumer Olivia [name] will: Actively participate in therapy, working towards healthy functioning.    *Justification for Continuation/Discontinuation of Goal: R=Revised, O=Ongoing, A=Achieved, D=Discontinued  Goal 1) Develop healthy interpersonal relationships that lead to the alleviation and help prevent the relapse of depression.  5 Point Likert rating baseline date: 05/28/2021 Target Date Goal Was reviewed Status Code Progress towards goal/Likert rating  05/29/2023 05/28/2022         O 3/5 - learning to set better limits and boundaries, still hasn't been able to regulate her daily routines             Goal 2) Learn and implement coping skills that result in a reduction of anxiety and worry, and improved daily functioning.  5 Point Likert rating baseline date: 05/28/2021  Target Date Goal Was reviewed Status Code Progress towards goal/Likert rating  05/29/2023 05/28/2022          O 2/5 - learning to use her skills more consistently             Goal 3) Combine skills learned in therapy into a new daily approach to managing ADHD.  5 Point Likert rating baseline date: 05/28/2021  Target Date Goal Was reviewed Status Code Progress towards goal/Likert rating  05/29/2023 05/28/2022          O 2/5 - learning to use her skills more consistently             This plan has been reviewed and created by the following participants:  This plan will be reviewed at least every 12 months. Date Behavioral Health Clinician Date Guardian/Patient   05/28/2022 [name], Ph.D.   05/28/2022 [name]" Unk Lightning, PhD

## 2022-06-04 ENCOUNTER — Ambulatory Visit (INDEPENDENT_AMBULATORY_CARE_PROVIDER_SITE_OTHER): Payer: No Typology Code available for payment source | Admitting: Psychology

## 2022-06-04 DIAGNOSIS — F320 Major depressive disorder, single episode, mild: Secondary | ICD-10-CM

## 2022-06-04 NOTE — Progress Notes (Signed)
PROGRESS NOTE: Annual review   Name: Olivia [name] Date: 06/04/2022 MRN: [id number] DOB: 03-15-1966 PCP: Leamon Arnt, MD  Time spent: 3:00 - 4:00 PM  Annual Review: 05/29/2023  Today I met with  Olivia [name] in remote video (WebEx) face-to-face individual psychotherapy.  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 She is normally a high functioning and driven individual and it is a struggle to get motivated to do anything. If it is necessary for work she does it and does a good job, but it is not the same. She has gained 70 pounds in less than 9 months and it has impacted her self image. She is also peri menopausal and these hormonal changes are also likely to be contributing to her changing mood states.   A year and a half ago her father had quadrupel by-pass surgery and he was recently diagnosed with cancer. 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  Olivia [name] states that she was diagnosed with adult ADHD and responded well to medications.    Mental Status Exam: Appearance:   Casual     Behavior:  Appropriate  Motor:  Normal  Speech/Language:   NA  Affect:  Appropriate  Mood:  normal  Thought process:  normal  Thought content:    WNL  Sensory/Perceptual disturbances:    WNL  Orientation:  oriented to person, place, time/date, situation, and day of week  Attention:  Good  Concentration:  Good  Memory:  WNL  Fund of knowledge:   Good  Insight:    Good  Judgment:   Good  Impulse Control:  Good    Risk Assessment: Danger to Self:  No Self-injurious Behavior: No Danger to Others: No Duty to Warn:no Physical Aggression / Violence:No  Substance Abuse History: Current substance abuse: No     Past Psychiatric History:    No previous psychological problems have been observed Outpatient Providers: Current therapist History of Psych Hospitalization: No  Psychological Testing: Attention/ADHD:  ?    Abuse History:  Victim of: No.,  n/a    Report needed: No. Victim of Neglect:No. Witness / Exposure to Domestic Violence: No   Protective Services Involvement: No  Witness to [employer] Violence:  No   Family History:  Family History  Problem Relation Age of Onset   Diabetes Mother    Obesity Mother    Colon polyps Mother    Colon cancer Mother    Obesity Father    Hypertension Father    Obesity Sister     Living situation: the patient lives with their family  Sexual Orientation: Straight  Relationship Status: married  Name of spouse / other: Olivia [name] (52) he is a Publishing copy and works for [employer], [employer]. He is worried about her and is doing an amazing job of taking care of her.   Olivia [name] was married early. She married her first husband and was a "scum bag." She quit school and was a single mom raising a child on her own. She managed to go back to school and got a nursing degree.   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)   Son: Olivia [name] (55) he is on the  ASD, he attends [employer]: spouse friends parents  Museum/gallery curator Stress:  No   Income/Employment/Disability: Employment  Armed forces logistics/support/administrative officer: No   Educational History: Education: post Forensic psychologist work or Public house manager: N/a  Any cultural differences that may affect / interfere with treatment:  n/a  Recreation/Hobbies: music, crafting  Stressors: conflict with daughter, work related stress  Strengths: Supportive Relationships, Family, Friends, Financial controller, Hopefulness, and Conservator, museum/gallery  Barriers:  none  Legal History: Pending legal issue / charges: The patient  has no significant history of legal issues. History of legal issue / charges:  n/a  Medical History/Surgical History: reviewed Past Medical History:  Diagnosis Date   Anxiety    Asthma    Environmental allergies    Hiatal hernia    Hiatal hernia without gangrene or obstruction 08/19/2018   History of parathyroid surgery 2021 01/17/2021   For primary hyperparathyroidism. Dr. Harlow Asa   HLD (hyperlipidemia) 04/15/2008   Hypertension    Microcytic anemia    Migraine, hormonal    Multiple thyroid nodules 08/19/2018   2016 IMPRESSION: Multinodular goiter with multiple bilateral thyroid nodules. These are generally round and hypoechoic. There is a mildly complex cyst in the right lobe measuring up to 1.1 cm. The largest solid nodule is in the inferior right lobe measures 1.8 x 1.1 x 1.4 cm. This could either be sampled or followed by ultrasound.   PAC (premature atrial contraction)    PACs (premature atrial contraction)    Palpitations    PCOS (polycystic ovarian syndrome) 10/25/2014   PONV (postoperative nausea and vomiting)    Pre-diabetes    Reflux    Seasonal allergies     Past Surgical History:  Procedure Laterality Date   HIATAL HERNIA REPAIR  04/14/2012   Procedure: LAPAROSCOPIC REPAIR OF HIATAL HERNIA;  Surgeon: Pedro Earls, MD;  Location: [facility];  Service: General;  Laterality: N/A;  Hiatal Hernia Repair-Replacement of Lap Band   LAPAROSCOPIC GASTRIC BANDING  08/08/2008   PARATHYROIDECTOMY Left 06/02/2020   Procedure: LEFT INFERIOR PARATHYROIDECTOMY;  Surgeon: Armandina Gemma, MD;  Location: [facility];  Service: General;  Laterality: Left;   THYROIDECTOMY N/A 06/02/2020   Procedure: TOTAL THYROIDECTOMY;  Surgeon: Armandina Gemma, MD;  Location: [facility];  Service: General;  Laterality: N/A;   TONSILLECTOMY  1981    Medications: Current Outpatient Medications  Medication Sig Dispense Refill   albuterol (PROVENTIL HFA) 108 (90 Base) MCG/ACT inhaler Inhale 2 puffs into the lungs every  6 (six) hours as needed for wheezing or shortness of breath. 18 g 2   albuterol (PROVENTIL) (2.5 MG/3ML) 0.083% nebulizer solution Take 3 mLs (2.5 mg total) by nebulization every 6 (six) hours as needed for wheezing or shortness of breath. 75 mL 12   ALPRAZolam (XANAX) 0.5 MG tablet Take 1 tablet (0.5 mg total) by mouth 2 (two) times daily as needed for anxiety. (Patient not taking: Reported on 11/06/2021) 30 tablet 0   buPROPion (WELLBUTRIN XL) 150 MG 24 hr tablet Take 1 tablet (150 mg total) by mouth daily. 90 tablet 3   cetirizine (ZYRTEC) 10 MG tablet Take 10 mg by mouth daily as needed for allergies.      diclofenac sodium (VOLTAREN) 1 % GEL Apply topically to affected area qid (Patient taking differently: Apply 1 application topically 4 (four) times daily as needed (pain).) 100 g 1   escitalopram (LEXAPRO) 20 MG tablet Take 1 tablet (20 mg total) by mouth daily. [place]  tablet 1   famotidine (PEPCID) 20 MG tablet Take 20 mg by mouth daily as needed for heartburn or indigestion. (Patient not taking: Reported on 11/06/2021)     ferrous sulfate 325 (65 FE) MG EC tablet Take 325 mg by mouth daily with breakfast.     levothyroxine (SYNTHROID) 112 MCG tablet Take 1 tablet (112 mcg total) by mouth daily. 90 tablet 2   liothyronine (CYTOMEL) 25 MCG tablet Take 1 tablet by mouth once daily. 90 tablet 0   magnesium 30 MG tablet Take 30 mg by mouth daily.     metoprolol tartrate (LOPRESSOR) 25 MG tablet Take 12.5 mg by mouth daily as needed. (Patient not taking: Reported on 11/06/2021) 60 tablet 1   Multiple Vitamin (MULITIVITAMIN WITH MINERALS) TABS Take 1 tablet by mouth daily.     mupirocin cream (BACTROBAN) 2 % Apply 1 application topically 2 (two) times daily. 15 g 0   nitroGLYCERIN (NITRODUR - DOSED IN MG/24 HR) 0.2 mg/hr patch Apply 1/4 patch daily to tendon for tendonitis. 30 patch 1   Omega-3 Fatty Acids (FISH OIL) 1000 MG CAPS Take 1,000 mg by mouth daily. (Patient not taking: Reported on 11/06/2021)      rosuvastatin (CRESTOR) 10 MG tablet TAKE 1 TABLET (10 MG TOTAL) BY MOUTH DAILY. (Patient not taking: Reported on 11/06/2021) 90 tablet 3   SYNTHROID 112 MCG tablet 1 tablet in the morning on an empty stomach Orally Once a day 90 days 90 tablet 4   SYNTHROID 125 MCG tablet Take 1 tablet (125 mcg total) by mouth daily in the morning on an empty stomach. 30 tablet 11   Turmeric (QC TUMERIC COMPLEX PO) Take 1 capsule by mouth daily.     Vitamin D, Cholecalciferol, 50 MCG (2000 UT) CAPS Take 2,000 Units by mouth daily.     No current facility-administered medications for this visit.    Allergies  Allergen Reactions   Cephalosporins Anaphylaxis and Other (See Comments)    Caused hypotension and a fever   Eggs Or Egg-Derived Products Hives, Swelling and Other (See Comments)    Angioedema   Penicillins Anaphylaxis    Has patient had a PCN reaction causing immediate rash, facial/tongue/throat swelling, SOB or lightheadedness with hypotension: Yes Has patient had a PCN reaction causing severe rash involving mucus membranes or skin necrosis: No Has patient had a PCN reaction that required hospitalization: Observed X12 hrs Has patient had a PCN reaction occurring within the last 10 years: No If all of the above answers are "NO", then may proceed with Cephalosporin use.    Sulfa Drugs Cross Reactors Other (See Comments)    Liver failure   Adhesive [Tape] Hives and Itching    Cannot wear for any length of time   Effexor [Venlafaxine] Other (See Comments)    Lightheadedness, dizziness, and headaches resulted    Diagnoses:  Major depressive disorder, single episode, mild (HCC)   Individualized Treatment Plan                Strengths: bright, verbal, motivated, self aware, self advocate   Supports: spouse, family, friends, colleagues   Goal/Needs for Treatment:  In order of importance to patient  1) Develop healthy interpersonal relationships that lead to the alleviation and help prevent the  relapse of depression.  2) Learn and implement coping skills that result in a reduction of anxiety and worry, and improved functioning.   3) Combine skills learned in therapy into a new daily approach to managing ADHD.  Client Statement of Needs: requests assistance with piecing back together her life as it once was before she got depressed (burnt out).   Treatment Level: Weekly Individual Outpatient Psychotherapy.  Symptoms: autonomic hyperactivity (e.g., palpitations, shortness of breath, dry mouth, trouble swallowing, nausea, diarrhea).  Childhood history of Attention Deficit Disorder (ADD) that was either diagnosed or later concluded due to the symptoms of behavioral problems at school, impulsivity, temper outbursts, and lack of concentration.  Depressed or irritable mood. Diminished interest in or enjoyment of activities. Easily distracted and drawn from task at hand.  Excessive and/or unrealistic worry that is difficult to control occurring more days than not for at least 6 months about a number of events or activities.  Feelings of hopelessness, worthlessness, or inappropriate guilt.  Lack of energy.   Poor concentration and indecisiveness.  Restless and fidgety; unable to be sedentary for more than a short time.   Client Treatment Preferences: Continue with current therapist   Healthcare consumer's goal for treatment:  Psychologist, [name], Ph.D. will support the patient's ability to achieve the goals identified. Cognitive Behavioral Therapy, Dialectical Behavioral Therapy, Motivational Interviewing, SPACE, parent training, and other evidenced-based practices will be used to promote progress towards healthy functioning.   Healthcare consumer Olivia [name] will: Actively participate in therapy, working towards healthy functioning.    *Justification for Continuation/Discontinuation of Goal: R=Revised, O=Ongoing, A=Achieved, D=Discontinued  Goal 1) Develop  healthy interpersonal relationships that lead to the alleviation and help prevent the relapse of depression.  5 Point Likert rating baseline date: 05/28/2021 Target Date Goal Was reviewed Status Code Progress towards goal/Likert rating  05/29/2023 05/28/2022         O 3/5 - learning to set better limits and boundaries, still hasn't been able to regulate her daily routines             Goal 2) Learn and implement coping skills that result in a reduction of anxiety and worry, and improved daily functioning.  5 Point Likert rating baseline date: 05/28/2021  Target Date Goal Was reviewed Status Code Progress towards goal/Likert rating  05/29/2023 05/28/2022          O 2/5 - learning to use her skills more consistently             Goal 3) Combine skills learned in therapy into a new daily approach to managing ADHD.  5 Point Likert rating baseline date: 05/28/2021  Target Date Goal Was reviewed Status Code Progress towards goal/Likert rating  05/29/2023 05/28/2022          O 2/5 - learning to use her skills more consistently             This plan has been reviewed and created by the following participants:  This plan will be reviewed at least every 12 months. Date Behavioral Health Clinician Date Guardian/Patient   05/28/2022 [name], Ph.D.   05/28/2022 Olivia [name] reports that she seems to have flipped her sleep/ wake cycle as a result of working more evening shifts.  She is still not feeling well and slept a lot.  Unfortunately, she also had too much time to think and talked herself into a very negative spiral.  We d/e/p where she has been mentally this week.  She agreed to work on being more vigilant of her negative thinking and to use her self  talk skills.     [name], PhD

## 2022-06-11 ENCOUNTER — Ambulatory Visit: Payer: No Typology Code available for payment source | Admitting: Psychology

## 2022-06-12 ENCOUNTER — Ambulatory Visit (INDEPENDENT_AMBULATORY_CARE_PROVIDER_SITE_OTHER): Payer: No Typology Code available for payment source | Admitting: Psychology

## 2022-06-12 DIAGNOSIS — F320 Major depressive disorder, single episode, mild: Secondary | ICD-10-CM | POA: Diagnosis not present

## 2022-06-12 NOTE — Progress Notes (Signed)
PROGRESS NOTE:   Name: Olivia [name] Date: 06/12/2022 MRN: [id number] DOB: [birth year] PCP: Leamon Arnt, MD  Time spent: 3:00 - 4:00 PM  Annual Review: 05/29/2023  Today I met with  Olivia [name] in remote video (WebEx) face-to-face individual psychotherapy.  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 She is normally a high functioning and driven individual and it is a struggle to get motivated to do anything. If it is necessary for work she does it and does a good job, but it is not the same. She has gained 70 pounds in less than 9 months and it has impacted her self image. She is also peri menopausal and these hormonal changes are also likely to be contributing to her changing mood states.   A year and a half ago her father had quadrupel by-pass surgery and he was recently diagnosed with cancer. 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  Olivia [name] states that she was diagnosed with adult ADHD and responded well to medications.    Mental Status Exam: Appearance:   Casual     Behavior:  Appropriate  Motor:  Normal  Speech/Language:   NA  Affect:  Appropriate  Mood:  normal  Thought process:  normal  Thought content:    WNL  Sensory/Perceptual disturbances:    WNL  Orientation:  oriented to person, place, time/date, situation, and day of week  Attention:  Good  Concentration:  Good  Memory:  WNL  Fund of knowledge:   Good  Insight:    Good  Judgment:   Good  Impulse Control:  Good    Risk Assessment: Danger to Self:  No Self-injurious Behavior: No Danger to Others: No Duty to Warn:no Physical Aggression / Violence:No  Substance Abuse History: Current substance abuse: No     Past Psychiatric History:   No previous  psychological problems have been observed Outpatient Providers: Current therapist History of Psych Hospitalization: No  Psychological Testing: Attention/ADHD:  ?    Abuse History:  Victim of: No.,  n/a    Report needed: No. Victim of Neglect:No. Witness / Exposure to Domestic Violence: No   Protective Services Involvement: No  Witness to [employer] Violence:  No   Family History:  Family History  Problem Relation Age of Onset   Diabetes Mother    Obesity Mother    Colon polyps Mother    Colon cancer Mother    Obesity Father    Hypertension Father    Obesity Sister     Living situation: the patient lives with their family  Sexual Orientation: Straight  Relationship Status: married  Name of spouse / other: Olivia [name] (52) he is a Publishing copy and works for [employer], [employer]. He is worried about her and is doing an amazing job of taking care of her.   Olivia [name] was married early. She married her first husband and was a "scum bag." She quit school and was a single mom raising a child on her own. She managed to go back to school and got a nursing degree.   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)   Son: Olivia [name] (34) he is on the ASD, he attends  [employer]   Support Systems: spouse friends parents  Museum/gallery curator Stress:  No   Income/Employment/Disability: Employment  Armed forces logistics/support/administrative officer: No   Educational History: Education: post Forensic psychologist work or Public house manager: N/a  Any cultural differences that may affect / interfere with treatment:  n/a  Recreation/Hobbies: music, crafting  Stressors: conflict with daughter, work related stress  Strengths: Supportive Relationships, Family, Friends, Financial controller, Hopefulness, and Conservator, museum/gallery  Barriers:  none  Legal History: Pending legal issue / charges: The patient has no  significant history of legal issues. History of legal issue / charges:  n/a  Medical History/Surgical History: reviewed Past Medical History:  Diagnosis Date   Anxiety    Asthma    Environmental allergies    Hiatal hernia    Hiatal hernia without gangrene or obstruction 08/19/2018   History of parathyroid surgery 2021 01/17/2021   For primary hyperparathyroidism. Dr. Harlow Asa   HLD (hyperlipidemia) 04/15/2008   Hypertension    Microcytic anemia    Migraine, hormonal    Multiple thyroid nodules 08/19/2018   2016 IMPRESSION: Multinodular goiter with multiple bilateral thyroid nodules. These are generally round and hypoechoic. There is a mildly complex cyst in the right lobe measuring up to 1.1 cm. The largest solid nodule is in the inferior right lobe measures 1.8 x 1.1 x 1.4 cm. This could either be sampled or followed by ultrasound.   PAC (premature atrial contraction)    PACs (premature atrial contraction)    Palpitations    PCOS (polycystic ovarian syndrome) 10/25/2014   PONV (postoperative nausea and vomiting)    Pre-diabetes    Reflux    Seasonal allergies     Past Surgical History:  Procedure Laterality Date   HIATAL HERNIA REPAIR  04/14/2012   Procedure: LAPAROSCOPIC REPAIR OF HIATAL HERNIA;  Surgeon: Pedro Earls, MD;  Location: [facility];  Service: General;  Laterality: N/A;  Hiatal Hernia Repair-Replacement of Lap Band   LAPAROSCOPIC GASTRIC BANDING  08/08/2008   PARATHYROIDECTOMY Left 06/02/2020   Procedure: LEFT INFERIOR PARATHYROIDECTOMY;  Surgeon: Armandina Gemma, MD;  Location: [facility];  Service: General;  Laterality: Left;   THYROIDECTOMY N/A 06/02/2020   Procedure: TOTAL THYROIDECTOMY;  Surgeon: Armandina Gemma, MD;  Location: [facility];  Service: General;  Laterality: N/A;   TONSILLECTOMY  1981    Medications: Current Outpatient Medications  Medication Sig Dispense Refill   albuterol (PROVENTIL HFA) 108 (90 Base) MCG/ACT inhaler Inhale 2 puffs into the lungs every 6  (six) hours as needed for wheezing or shortness of breath. 18 g 2   albuterol (PROVENTIL) (2.5 MG/3ML) 0.083% nebulizer solution Take 3 mLs (2.5 mg total) by nebulization every 6 (six) hours as needed for wheezing or shortness of breath. 75 mL 12   ALPRAZolam (XANAX) 0.5 MG tablet Take 1 tablet (0.5 mg total) by mouth 2 (two) times daily as needed for anxiety. (Patient not taking: Reported on 11/06/2021) 30 tablet 0   buPROPion (WELLBUTRIN XL) 150 MG 24 hr tablet Take 1 tablet (150 mg total) by mouth daily. 90 tablet 3   cetirizine (ZYRTEC) 10 MG tablet Take 10 mg by mouth daily as needed for allergies.      diclofenac sodium (VOLTAREN) 1 % GEL Apply topically to affected area qid (Patient taking differently: Apply 1 application topically 4 (four) times daily as needed (pain).) 100 g 1   escitalopram (LEXAPRO) 20 MG tablet Take 1 tablet (20 mg total) by mouth daily. 90 tablet 1  famotidine (PEPCID) 20 MG tablet Take 20 mg by mouth daily as needed for heartburn or indigestion. (Patient not taking: Reported on 11/06/2021)     ferrous sulfate 325 (65 FE) MG EC tablet Take 325 mg by mouth daily with breakfast.     levothyroxine (SYNTHROID) 112 MCG tablet Take 1 tablet (112 mcg total) by mouth daily. 90 tablet 2   liothyronine (CYTOMEL) 25 MCG tablet Take 1 tablet by mouth once daily. 90 tablet 0   magnesium 30 MG tablet Take 30 mg by mouth daily.     metoprolol tartrate (LOPRESSOR) 25 MG tablet Take 12.5 mg by mouth daily as needed. (Patient not taking: Reported on 11/06/2021) 60 tablet 1   Multiple Vitamin (MULITIVITAMIN WITH MINERALS) TABS Take 1 tablet by mouth daily.     mupirocin cream (BACTROBAN) 2 % Apply 1 application topically 2 (two) times daily. 15 g 0   nitroGLYCERIN (NITRODUR - DOSED IN MG/24 HR) 0.2 mg/hr patch Apply 1/4 patch daily to tendon for tendonitis. 30 patch 1   Omega-3 Fatty Acids (FISH OIL) 1000 MG CAPS Take 1,000 mg by mouth daily. (Patient not taking: Reported on 11/06/2021)      rosuvastatin (CRESTOR) 10 MG tablet TAKE 1 TABLET (10 MG TOTAL) BY MOUTH DAILY. (Patient not taking: Reported on 11/06/2021) 90 tablet 3   SYNTHROID 112 MCG tablet 1 tablet in the morning on an empty stomach Orally Once a day 90 days 90 tablet 4   SYNTHROID 125 MCG tablet Take 1 tablet (125 mcg total) by mouth daily in the morning on an empty stomach. 30 tablet 11   Turmeric (QC TUMERIC COMPLEX PO) Take 1 capsule by mouth daily.     Vitamin D, Cholecalciferol, 50 MCG (2000 UT) CAPS Take 2,000 Units by mouth daily.     No current facility-administered medications for this visit.    Allergies  Allergen Reactions   Cephalosporins Anaphylaxis and Other (See Comments)    Caused hypotension and a fever   Eggs Or Egg-Derived Products Hives, Swelling and Other (See Comments)    Angioedema   Penicillins Anaphylaxis    Has patient had a PCN reaction causing immediate rash, facial/tongue/throat swelling, SOB or lightheadedness with hypotension: Yes Has patient had a PCN reaction causing severe rash involving mucus membranes or skin necrosis: No Has patient had a PCN reaction that required hospitalization: Observed X12 hrs Has patient had a PCN reaction occurring within the last 10 years: No If all of the above answers are "NO", then may proceed with Cephalosporin use.    Sulfa Drugs Cross Reactors Other (See Comments)    Liver failure   Adhesive [Tape] Hives and Itching    Cannot wear for any length of time   Effexor [Venlafaxine] Other (See Comments)    Lightheadedness, dizziness, and headaches resulted    Diagnoses:  Major depressive disorder, single episode, mild (HCC)   Individualized Treatment Plan                Strengths: bright, verbal, motivated, self aware, self advocate   Supports: spouse, family, friends, colleagues   Goal/Needs for Treatment:  In order of importance to patient  1) Develop healthy interpersonal relationships that lead to the alleviation and help prevent the  relapse of depression.  2) Learn and implement coping skills that result in a reduction of anxiety and worry, and improved functioning.   3) Combine skills learned in therapy into a new daily approach to managing ADHD.   Client Statement  of Needs: requests assistance with piecing back together her life as it once was before she got depressed (burnt out).   Treatment Level: Weekly Individual Outpatient Psychotherapy.  Symptoms: autonomic hyperactivity (e.g., palpitations, shortness of breath, dry mouth, trouble swallowing, nausea, diarrhea).  Childhood history of Attention Deficit Disorder (ADD) that was either diagnosed or later concluded due to the symptoms of behavioral problems at school, impulsivity, temper outbursts, and lack of concentration.  Depressed or irritable mood. Diminished interest in or enjoyment of activities. Easily distracted and drawn from task at hand.  Excessive and/or unrealistic worry that is difficult to control occurring more days than not for at least 6 months about a number of events or activities.  Feelings of hopelessness, worthlessness, or inappropriate guilt.  Lack of energy.   Poor concentration and indecisiveness.  Restless and fidgety; unable to be sedentary for more than a short time.   Client Treatment Preferences: Continue with current therapist   Healthcare consumer's goal for treatment:  Psychologist, Olivia [name], Ph.D. will support the patient's ability to achieve the goals identified. Cognitive Behavioral Therapy, Dialectical Behavioral Therapy, Motivational Interviewing, SPACE, parent training, and other evidenced-based practices will be used to promote progress towards healthy functioning.   Healthcare consumer Olivia [name] will: Actively participate in therapy, working towards healthy functioning.    *Justification for Continuation/Discontinuation of Goal: R=Revised, O=Ongoing, A=Achieved, D=Discontinued  Goal 1) Develop  healthy interpersonal relationships that lead to the alleviation and help prevent the relapse of depression.  5 Point Likert rating baseline date: 05/28/2021 Target Date Goal Was reviewed Status Code Progress towards goal/Likert rating  05/29/2023 05/28/2022         O 3/5 - learning to set better limits and boundaries, still hasn't been able to regulate her daily routines             Goal 2) Learn and implement coping skills that result in a reduction of anxiety and worry, and improved daily functioning.  5 Point Likert rating baseline date: 05/28/2021  Target Date Goal Was reviewed Status Code Progress towards goal/Likert rating  05/29/2023 05/28/2022          O 2/5 - learning to use her skills more consistently             Goal 3) Combine skills learned in therapy into a new daily approach to managing ADHD.  5 Point Likert rating baseline date: 05/28/2021  Target Date Goal Was reviewed Status Code Progress towards goal/Likert rating  05/29/2023 05/28/2022          O 2/5 - learning to use her skills more consistently             This plan has been reviewed and created by the following participants:  This plan will be reviewed at least every 12 months. Date Behavioral Health Clinician Date Guardian/Patient   05/28/2022 Olivia [name], Ph.D.   05/28/2022 [name]" Dalphine Cowie reports that she got a call from her niece who was dealing with a crisis.  We d/e/p how she has been coping with the news she received, managing her self blaming and actually steps she can take to support her niece.  She agreed to spend some time writing a few self talk statements to help her more accurately respond to thoughts of self blaming.  [name] states that her sleep has improved.  She is actually  sleep more than normal as she reduces her sleep deficit on all the sleep she's missed on that last several weeks.      Olivia Crochet, PhD

## 2022-06-17 ENCOUNTER — Other Ambulatory Visit (HOSPITAL_BASED_OUTPATIENT_CLINIC_OR_DEPARTMENT_OTHER): Payer: Self-pay

## 2022-06-18 ENCOUNTER — Ambulatory Visit (INDEPENDENT_AMBULATORY_CARE_PROVIDER_SITE_OTHER): Payer: No Typology Code available for payment source | Admitting: Psychology

## 2022-06-18 DIAGNOSIS — F320 Major depressive disorder, single episode, mild: Secondary | ICD-10-CM

## 2022-06-20 NOTE — Progress Notes (Signed)
PROGRESS NOTE:   Name: Olivia [name] Date: 06/20/2022 MRN: [id number] DOB: 14-Apr-1966 PCP: Leamon Arnt, MD  Time spent: 3:00 - 4:00 PM  Annual Review: 05/29/2023  Today I met with  [name] in remote video (WebEx) face-to-face individual psychotherapy.  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 She is normally a high functioning and driven individual and it is a struggle to get motivated to do anything. If it is necessary for work she does it and does a good job, but it is not the same. She has gained 70 pounds in less than 9 months and it has impacted her self image. She is also peri menopausal and these hormonal changes are also likely to be contributing to her changing mood states.   A year and a half ago her father had quadrupel by-pass surgery and he was recently diagnosed with cancer. 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  Somara states that she was diagnosed with adult ADHD and responded well to medications.    Mental Status Exam: Appearance:   Casual     Behavior:  Appropriate  Motor:  Normal  Speech/Language:   NA  Affect:  Appropriate  Mood:  normal  Thought process:  normal  Thought content:    WNL  Sensory/Perceptual disturbances:    WNL  Orientation:  oriented to person, place, time/date, situation, and day of week  Attention:  Good  Concentration:  Good  Memory:  WNL  Fund of knowledge:   Good  Insight:    Good  Judgment:   Good  Impulse Control:  Good    Risk Assessment: Danger to Self:  No Self-injurious Behavior: No Danger to Others: No Duty to Warn:no Physical Aggression / Violence:No  Substance Abuse History: Current substance abuse: No     Past Psychiatric History:   No previous  psychological problems have been observed Outpatient Providers: Current therapist History of Psych Hospitalization: No  Psychological Testing: Attention/ADHD:  ?    Abuse History:  Victim of: No.,  n/a    Report needed: No. Victim of Neglect:No. Witness / Exposure to Domestic Violence: No   Protective Services Involvement: No  Witness to [employer] Violence:  No   Family History:  Family History  Problem Relation Age of Onset   Diabetes Mother    Obesity Mother    Colon polyps Mother    Colon cancer Mother    Obesity Father    Hypertension Father    Obesity Sister     Living situation: the patient lives with their family  Sexual Orientation: Straight  Relationship Status: married  Name of spouse / other: Lanny Hurst (52) he is a Publishing copy and works for [employer], [employer]. He is worried about her and is doing an amazing job of taking care of her.   Janyia was married early. She married her first husband and was a "scum bag." She quit school and was a single mom raising a child on her own. She managed to go back to school and got a nursing degree.   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)   Son: Nate (37) he is on the ASD, he attends  [employer]   Support Systems: spouse friends parents  Museum/gallery curator Stress:  No   Income/Employment/Disability: Employment  Armed forces logistics/support/administrative officer: No   Educational History: Education: post Forensic psychologist work or Public house manager: N/a  Any cultural differences that may affect / interfere with treatment:  n/a  Recreation/Hobbies: music, crafting  Stressors: conflict with daughter, work related stress  Strengths: Supportive Relationships, Family, Friends, Financial controller, Hopefulness, and Conservator, museum/gallery  Barriers:  none  Legal History: Pending legal issue / charges: The patient has no  significant history of legal issues. History of legal issue / charges:  n/a  Medical History/Surgical History: reviewed Past Medical History:  Diagnosis Date   Anxiety    Asthma    Environmental allergies    Hiatal hernia    Hiatal hernia without gangrene or obstruction 08/19/2018   History of parathyroid surgery 2021 01/17/2021   For primary hyperparathyroidism. Dr. Harlow Asa   HLD (hyperlipidemia) 04/15/2008   Hypertension    Microcytic anemia    Migraine, hormonal    Multiple thyroid nodules 08/19/2018   2016 IMPRESSION: Multinodular goiter with multiple bilateral thyroid nodules. These are generally round and hypoechoic. There is a mildly complex cyst in the right lobe measuring up to 1.1 cm. The largest solid nodule is in the inferior right lobe measures 1.8 x 1.1 x 1.4 cm. This could either be sampled or followed by ultrasound.   PAC (premature atrial contraction)    PACs (premature atrial contraction)    Palpitations    PCOS (polycystic ovarian syndrome) 10/25/2014   PONV (postoperative nausea and vomiting)    Pre-diabetes    Reflux    Seasonal allergies     Past Surgical History:  Procedure Laterality Date   HIATAL HERNIA REPAIR  04/14/2012   Procedure: LAPAROSCOPIC REPAIR OF HIATAL HERNIA;  Surgeon: Pedro Earls, MD;  Location: [facility];  Service: General;  Laterality: N/A;  Hiatal Hernia Repair-Replacement of Lap Band   LAPAROSCOPIC GASTRIC BANDING  08/08/2008   PARATHYROIDECTOMY Left 06/02/2020   Procedure: LEFT INFERIOR PARATHYROIDECTOMY;  Surgeon: Armandina Gemma, MD;  Location: [facility];  Service: General;  Laterality: Left;   THYROIDECTOMY N/A 06/02/2020   Procedure: TOTAL THYROIDECTOMY;  Surgeon: Armandina Gemma, MD;  Location: [facility];  Service: General;  Laterality: N/A;   TONSILLECTOMY  1981    Medications: Current Outpatient Medications  Medication Sig Dispense Refill   albuterol (PROVENTIL HFA) 108 (90 Base) MCG/ACT inhaler Inhale 2 puffs into the lungs every 6  (six) hours as needed for wheezing or shortness of breath. 18 g 2   albuterol (PROVENTIL) (2.5 MG/3ML) 0.083% nebulizer solution Take 3 mLs (2.5 mg total) by nebulization every 6 (six) hours as needed for wheezing or shortness of breath. 75 mL 12   ALPRAZolam (XANAX) 0.5 MG tablet Take 1 tablet (0.5 mg total) by mouth 2 (two) times daily as needed for anxiety. (Patient not taking: Reported on 11/06/2021) 30 tablet 0   buPROPion (WELLBUTRIN XL) 150 MG 24 hr tablet Take 1 tablet (150 mg total) by mouth daily. 90 tablet 3   cetirizine (ZYRTEC) 10 MG tablet Take 10 mg by mouth daily as needed for allergies.      diclofenac sodium (VOLTAREN) 1 % GEL Apply topically to affected area qid (Patient taking differently: Apply 1 application topically 4 (four) times daily as needed (pain).) 100 g 1   escitalopram (LEXAPRO) 20 MG tablet Take 1 tablet (20 mg total) by mouth daily. 90 tablet 1  famotidine (PEPCID) 20 MG tablet Take 20 mg by mouth daily as needed for heartburn or indigestion. (Patient not taking: Reported on 11/06/2021)     ferrous sulfate 325 (65 FE) MG EC tablet Take 325 mg by mouth daily with breakfast.     levothyroxine (SYNTHROID) 112 MCG tablet Take 1 tablet (112 mcg total) by mouth daily. 90 tablet 2   liothyronine (CYTOMEL) 25 MCG tablet Take 1 tablet by mouth once daily. 90 tablet 0   magnesium 30 MG tablet Take 30 mg by mouth daily.     metoprolol tartrate (LOPRESSOR) 25 MG tablet Take 12.5 mg by mouth daily as needed. (Patient not taking: Reported on 11/06/2021) 60 tablet 1   Multiple Vitamin (MULITIVITAMIN WITH MINERALS) TABS Take 1 tablet by mouth daily.     mupirocin cream (BACTROBAN) 2 % Apply 1 application topically 2 (two) times daily. 15 g 0   nitroGLYCERIN (NITRODUR - DOSED IN MG/24 HR) 0.2 mg/hr patch Apply 1/4 patch daily to tendon for tendonitis. 30 patch 1   Omega-3 Fatty Acids (FISH OIL) 1000 MG CAPS Take 1,000 mg by mouth daily. (Patient not taking: Reported on 11/06/2021)      rosuvastatin (CRESTOR) 10 MG tablet TAKE 1 TABLET (10 MG TOTAL) BY MOUTH DAILY. (Patient not taking: Reported on 11/06/2021) 90 tablet 3   SYNTHROID 112 MCG tablet 1 tablet in the morning on an empty stomach Orally Once a day 90 days 90 tablet 4   SYNTHROID 125 MCG tablet Take 1 tablet (125 mcg total) by mouth daily in the morning on an empty stomach. 30 tablet 11   Turmeric (QC TUMERIC COMPLEX PO) Take 1 capsule by mouth daily.     Vitamin D, Cholecalciferol, 50 MCG (2000 UT) CAPS Take 2,000 Units by mouth daily.     No current facility-administered medications for this visit.    Allergies  Allergen Reactions   Cephalosporins Anaphylaxis and Other (See Comments)    Caused hypotension and a fever   Eggs Or Egg-Derived Products Hives, Swelling and Other (See Comments)    Angioedema   Penicillins Anaphylaxis    Has patient had a PCN reaction causing immediate rash, facial/tongue/throat swelling, SOB or lightheadedness with hypotension: Yes Has patient had a PCN reaction causing severe rash involving mucus membranes or skin necrosis: No Has patient had a PCN reaction that required hospitalization: Observed X12 hrs Has patient had a PCN reaction occurring within the last 10 years: No If all of the above answers are "NO", then may proceed with Cephalosporin use.    Sulfa Drugs Cross Reactors Other (See Comments)    Liver failure   Adhesive [Tape] Hives and Itching    Cannot wear for any length of time   Effexor [Venlafaxine] Other (See Comments)    Lightheadedness, dizziness, and headaches resulted    Diagnoses:  Major depressive disorder, single episode, mild (HCC)   Individualized Treatment Plan                Strengths: bright, verbal, motivated, self aware, self advocate   Supports: spouse, family, friends, colleagues   Goal/Needs for Treatment:  In order of importance to patient  1) Develop healthy interpersonal relationships that lead to the alleviation and help prevent the  relapse of depression.  2) Learn and implement coping skills that result in a reduction of anxiety and worry, and improved functioning.   3) Combine skills learned in therapy into a new daily approach to managing ADHD.   Client Statement  of Needs: requests assistance with piecing back together her life as it once was before she got depressed (burnt out).   Treatment Level: Weekly Individual Outpatient Psychotherapy.  Symptoms: autonomic hyperactivity (e.g., palpitations, shortness of breath, dry mouth, trouble swallowing, nausea, diarrhea).  Childhood history of Attention Deficit Disorder (ADD) that was either diagnosed or later concluded due to the symptoms of behavioral problems at school, impulsivity, temper outbursts, and lack of concentration.  Depressed or irritable mood. Diminished interest in or enjoyment of activities. Easily distracted and drawn from task at hand.  Excessive and/or unrealistic worry that is difficult to control occurring more days than not for at least 6 months about a number of events or activities.  Feelings of hopelessness, worthlessness, or inappropriate guilt.  Lack of energy.   Poor concentration and indecisiveness.  Restless and fidgety; unable to be sedentary for more than a short time.   Client Treatment Preferences: Continue with current therapist   Healthcare consumer's goal for treatment:  Psychologist, [name], Ph.D. will support the patient's ability to achieve the goals identified. Cognitive Behavioral Therapy, Dialectical Behavioral Therapy, Motivational Interviewing, SPACE, parent training, and other evidenced-based practices will be used to promote progress towards healthy functioning.   Healthcare consumer Makensey "[name]" Cuthbert will: Actively participate in therapy, working towards healthy functioning.    *Justification for Continuation/Discontinuation of Goal: R=Revised, O=Ongoing, A=Achieved, D=Discontinued  Goal 1) Develop  healthy interpersonal relationships that lead to the alleviation and help prevent the relapse of depression.  5 Point Likert rating baseline date: 05/28/2021 Target Date Goal Was reviewed Status Code Progress towards goal/Likert rating  05/29/2023 05/28/2022         O 3/5 - learning to set better limits and boundaries, still hasn't been able to regulate her daily routines             Goal 2) Learn and implement coping skills that result in a reduction of anxiety and worry, and improved daily functioning.  5 Point Likert rating baseline date: 05/28/2021  Target Date Goal Was reviewed Status Code Progress towards goal/Likert rating  05/29/2023 05/28/2022          O 2/5 - learning to use her skills more consistently             Goal 3) Combine skills learned in therapy into a new daily approach to managing ADHD.  5 Point Likert rating baseline date: 05/28/2021  Target Date Goal Was reviewed Status Code Progress towards goal/Likert rating  05/29/2023 05/28/2022          O 2/5 - learning to use her skills more consistently             This plan has been reviewed and created by the following participants:  This plan will be reviewed at least every 12 months. Date Behavioral Health Clinician Date Guardian/Patient   05/28/2022 [name], Ph.D.   05/28/2022 [name]" Kamyra Schroeck reports that she was able to follow up her niece but she wasn't in the mood to d/ the topic.  She was however able to talk to her sister and help her to better know how to respond to her daughter and access resources.  We d/e/p these interaction and her own feeling of guilt.  We p/ her feelings of guilt, identified distortions and d/ how to manage her negative thoughts.   [name],  PhD

## 2022-06-25 ENCOUNTER — Ambulatory Visit: Payer: Self-pay | Admitting: Psychology

## 2022-06-26 ENCOUNTER — Ambulatory Visit (INDEPENDENT_AMBULATORY_CARE_PROVIDER_SITE_OTHER): Payer: No Typology Code available for payment source | Admitting: Psychology

## 2022-06-26 DIAGNOSIS — F320 Major depressive disorder, single episode, mild: Secondary | ICD-10-CM | POA: Diagnosis not present

## 2022-06-26 NOTE — Progress Notes (Signed)
PROGRESS NOTE:   Name: Olivia [name] Date: 06/26/2022 MRN: [id number] DOB: 07-26-1966 PCP: Leamon Arnt, MD  Time spent: 3:00 - 4:00 PM  Annual Review: 05/29/2023  Today I met with  Olivia [name] in remote video (WebEx) face-to-face individual psychotherapy.  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 She is normally a high functioning and driven individual and it is a struggle to get motivated to do anything. If it is necessary for work she does it and does a good job, but it is not the same. She has gained 70 pounds in less than 9 months and it has impacted her self image. She is also peri menopausal and these hormonal changes are also likely to be contributing to her changing mood states.   A year and a half ago her father had quadrupel by-pass surgery and he was recently diagnosed with cancer. 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  Olivia [name] states that she was diagnosed with adult ADHD and responded well to medications.    Mental Status Exam: Appearance:   Casual     Behavior:  Appropriate  Motor:  Normal  Speech/Language:   NA  Affect:  Appropriate  Mood:  normal  Thought process:  normal  Thought content:    WNL  Sensory/Perceptual disturbances:    WNL  Orientation:  oriented to person, place, time/date, situation, and day of week  Attention:  Good  Concentration:  Good  Memory:  WNL  Fund of knowledge:   Good  Insight:    Good  Judgment:   Good  Impulse Control:  Good    Risk Assessment: Danger to Self:  No Self-injurious Behavior: No Danger to Others: No Duty to Warn:no Physical Aggression / Violence:No  Substance Abuse History: Current substance abuse: No     Past Psychiatric History:   No previous  psychological problems have been observed Outpatient Providers: Current therapist History of Psych Hospitalization: No  Psychological Testing: Attention/ADHD:  ?    Abuse History:  Victim of: No.,  n/a    Report needed: No. Victim of Neglect:No. Witness / Exposure to Domestic Violence: No   Protective Services Involvement: No  Witness to [employer] Violence:  No   Family History:  Family History  Problem Relation Age of Onset   Diabetes Mother    Obesity Mother    Colon polyps Mother    Colon cancer Mother    Obesity Father    Hypertension Father    Obesity Sister     Living situation: the patient lives with their family  Sexual Orientation: Straight  Relationship Status: married  Name of spouse / other: Olivia [name] (52) he is a Publishing copy and works for [employer], [employer]. He is worried about her and is doing an amazing job of taking care of her.   Olivia [name] was married early. She married her first husband and was a "scum bag." She quit school and was a single mom raising a child on her own. She managed to go back to school and got a nursing degree.   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)   Son: Nate (3) he is on the ASD, he attends  [employer]   Support Systems: spouse friends parents  Museum/gallery curator Stress:  No   Income/Employment/Disability: Employment  Armed forces logistics/support/administrative officer: No   Educational History: Education: post Forensic psychologist work or Public house manager: N/a  Any cultural differences that may affect / interfere with treatment:  n/a  Recreation/Hobbies: music, crafting  Stressors: conflict with daughter, work related stress  Strengths: Supportive Relationships, Family, Friends, Financial controller, Hopefulness, and Conservator, museum/gallery  Barriers:  none  Legal History: Pending legal issue / charges: The patient has no  significant history of legal issues. History of legal issue / charges:  n/a  Medical History/Surgical History: reviewed Past Medical History:  Diagnosis Date   Anxiety    Asthma    Environmental allergies    Hiatal hernia    Hiatal hernia without gangrene or obstruction 08/19/2018   History of parathyroid surgery 2021 01/17/2021   For primary hyperparathyroidism. Dr. Harlow Asa   HLD (hyperlipidemia) 04/15/2008   Hypertension    Microcytic anemia    Migraine, hormonal    Multiple thyroid nodules 08/19/2018   2016 IMPRESSION: Multinodular goiter with multiple bilateral thyroid nodules. These are generally round and hypoechoic. There is a mildly complex cyst in the right lobe measuring up to 1.1 cm. The largest solid nodule is in the inferior right lobe measures 1.8 x 1.1 x 1.4 cm. This could either be sampled or followed by ultrasound.   PAC (premature atrial contraction)    PACs (premature atrial contraction)    Palpitations    PCOS (polycystic ovarian syndrome) 10/25/2014   PONV (postoperative nausea and vomiting)    Pre-diabetes    Reflux    Seasonal allergies     Past Surgical History:  Procedure Laterality Date   HIATAL HERNIA REPAIR  04/14/2012   Procedure: LAPAROSCOPIC REPAIR OF HIATAL HERNIA;  Surgeon: Pedro Earls, MD;  Location: [facility];  Service: General;  Laterality: N/A;  Hiatal Hernia Repair-Replacement of Lap Band   LAPAROSCOPIC GASTRIC BANDING  08/08/2008   PARATHYROIDECTOMY Left 06/02/2020   Procedure: LEFT INFERIOR PARATHYROIDECTOMY;  Surgeon: Armandina Gemma, MD;  Location: [facility];  Service: General;  Laterality: Left;   THYROIDECTOMY N/A 06/02/2020   Procedure: TOTAL THYROIDECTOMY;  Surgeon: Armandina Gemma, MD;  Location: [facility];  Service: General;  Laterality: N/A;   TONSILLECTOMY  1981    Medications: Current Outpatient Medications  Medication Sig Dispense Refill   albuterol (PROVENTIL HFA) 108 (90 Base) MCG/ACT inhaler Inhale 2 puffs into the lungs every 6  (six) hours as needed for wheezing or shortness of breath. 18 g 2   albuterol (PROVENTIL) (2.5 MG/3ML) 0.083% nebulizer solution Take 3 mLs (2.5 mg total) by nebulization every 6 (six) hours as needed for wheezing or shortness of breath. 75 mL 12   ALPRAZolam (XANAX) 0.5 MG tablet Take 1 tablet (0.5 mg total) by mouth 2 (two) times daily as needed for anxiety. (Patient not taking: Reported on 11/06/2021) 30 tablet 0   buPROPion (WELLBUTRIN XL) 150 MG 24 hr tablet Take 1 tablet (150 mg total) by mouth daily. 90 tablet 3   cetirizine (ZYRTEC) 10 MG tablet Take 10 mg by mouth daily as needed for allergies.      diclofenac sodium (VOLTAREN) 1 % GEL Apply topically to affected area qid (Patient taking differently: Apply 1 application topically 4 (four) times daily as needed (pain).) 100 g 1   escitalopram (LEXAPRO) 20 MG tablet Take 1 tablet (20 mg total) by mouth daily. 90 tablet 1  famotidine (PEPCID) 20 MG tablet Take 20 mg by mouth daily as needed for heartburn or indigestion. (Patient not taking: Reported on 11/06/2021)     ferrous sulfate 325 (65 FE) MG EC tablet Take 325 mg by mouth daily with breakfast.     levothyroxine (SYNTHROID) 112 MCG tablet Take 1 tablet (112 mcg total) by mouth daily. 90 tablet 2   liothyronine (CYTOMEL) 25 MCG tablet Take 1 tablet by mouth once daily. 90 tablet 0   magnesium 30 MG tablet Take 30 mg by mouth daily.     metoprolol tartrate (LOPRESSOR) 25 MG tablet Take 12.5 mg by mouth daily as needed. (Patient not taking: Reported on 11/06/2021) 60 tablet 1   Multiple Vitamin (MULITIVITAMIN WITH MINERALS) TABS Take 1 tablet by mouth daily.     mupirocin cream (BACTROBAN) 2 % Apply 1 application topically 2 (two) times daily. 15 g 0   nitroGLYCERIN (NITRODUR - DOSED IN MG/24 HR) 0.2 mg/hr patch Apply 1/4 patch daily to tendon for tendonitis. 30 patch 1   Omega-3 Fatty Acids (FISH OIL) 1000 MG CAPS Take 1,000 mg by mouth daily. (Patient not taking: Reported on 11/06/2021)      rosuvastatin (CRESTOR) 10 MG tablet TAKE 1 TABLET (10 MG TOTAL) BY MOUTH DAILY. (Patient not taking: Reported on 11/06/2021) 90 tablet 3   SYNTHROID 112 MCG tablet 1 tablet in the morning on an empty stomach Orally Once a day 90 days 90 tablet 4   SYNTHROID 125 MCG tablet Take 1 tablet (125 mcg total) by mouth daily in the morning on an empty stomach. 30 tablet 11   Turmeric (QC TUMERIC COMPLEX PO) Take 1 capsule by mouth daily.     Vitamin D, Cholecalciferol, 50 MCG (2000 UT) CAPS Take 2,000 Units by mouth daily.     No current facility-administered medications for this visit.    Allergies  Allergen Reactions   Cephalosporins Anaphylaxis and Other (See Comments)    Caused hypotension and a fever   Eggs Or Egg-Derived Products Hives, Swelling and Other (See Comments)    Angioedema   Penicillins Anaphylaxis    Has patient had a PCN reaction causing immediate rash, facial/tongue/throat swelling, SOB or lightheadedness with hypotension: Yes Has patient had a PCN reaction causing severe rash involving mucus membranes or skin necrosis: No Has patient had a PCN reaction that required hospitalization: Observed X12 hrs Has patient had a PCN reaction occurring within the last 10 years: No If all of the above answers are "NO", then may proceed with Cephalosporin use.    Sulfa Drugs Cross Reactors Other (See Comments)    Liver failure   Adhesive [Tape] Hives and Itching    Cannot wear for any length of time   Effexor [Venlafaxine] Other (See Comments)    Lightheadedness, dizziness, and headaches resulted    Diagnoses:  Major depressive disorder, single episode, mild (HCC)   Individualized Treatment Plan                Strengths: bright, verbal, motivated, self aware, self advocate   Supports: spouse, family, friends, colleagues   Goal/Needs for Treatment:  In order of importance to patient  1) Develop healthy interpersonal relationships that lead to the alleviation and help prevent the  relapse of depression.  2) Learn and implement coping skills that result in a reduction of anxiety and worry, and improved functioning.   3) Combine skills learned in therapy into a new daily approach to managing ADHD.   Client Statement  of Needs: requests assistance with piecing back together her life as it once was before she got depressed (burnt out).   Treatment Level: Weekly Individual Outpatient Psychotherapy.  Symptoms: autonomic hyperactivity (e.g., palpitations, shortness of breath, dry mouth, trouble swallowing, nausea, diarrhea).  Childhood history of Attention Deficit Disorder (ADD) that was either diagnosed or later concluded due to the symptoms of behavioral problems at school, impulsivity, temper outbursts, and lack of concentration.  Depressed or irritable mood. Diminished interest in or enjoyment of activities. Easily distracted and drawn from task at hand.  Excessive and/or unrealistic worry that is difficult to control occurring more days than not for at least 6 months about a number of events or activities.  Feelings of hopelessness, worthlessness, or inappropriate guilt.  Lack of energy.   Poor concentration and indecisiveness.  Restless and fidgety; unable to be sedentary for more than a short time.   Client Treatment Preferences: Continue with current therapist   Healthcare consumer's goal for treatment:  Psychologist, [name], Ph.D. will support the patient's ability to achieve the goals identified. Cognitive Behavioral Therapy, Dialectical Behavioral Therapy, Motivational Interviewing, SPACE, parent training, and other evidenced-based practices will be used to promote progress towards healthy functioning.   Healthcare consumer Olivia [name] will: Actively participate in therapy, working towards healthy functioning.    *Justification for Continuation/Discontinuation of Goal: R=Revised, O=Ongoing, A=Achieved, D=Discontinued  Goal 1) Develop  healthy interpersonal relationships that lead to the alleviation and help prevent the relapse of depression.  5 Point Likert rating baseline date: 05/28/2021 Target Date Goal Was reviewed Status Code Progress towards goal/Likert rating  05/29/2023 05/28/2022         O 3/5 - learning to set better limits and boundaries, still hasn't been able to regulate her daily routines             Goal 2) Learn and implement coping skills that result in a reduction of anxiety and worry, and improved daily functioning.  5 Point Likert rating baseline date: 05/28/2021  Target Date Goal Was reviewed Status Code Progress towards goal/Likert rating  05/29/2023 05/28/2022          O 2/5 - learning to use her skills more consistently             Goal 3) Combine skills learned in therapy into a new daily approach to managing ADHD.  5 Point Likert rating baseline date: 05/28/2021  Target Date Goal Was reviewed Status Code Progress towards goal/Likert rating  05/29/2023 05/28/2022          O 2/5 - learning to use her skills more consistently             This plan has been reviewed and created by the following participants:  This plan will be reviewed at least every 12 months. Date Behavioral Health Clinician Date Guardian/Patient   05/28/2022 [name], Ph.D.   05/28/2022 Olivia [name] reports that her sleep schedule is all messed up.   She missed our appointment yesterday because she slept through it because she was up for a day and a half.  I brought the question back to whether she will ever get to a point when she will work normal day hours.  It was a gentle challenge as she has set as a goal to reduce the number of middle of the night hours which have  a negative impact on her.  She agreed to write a pros & cons about continuing in this position and working these hours.      [name], PhD

## 2022-07-02 ENCOUNTER — Ambulatory Visit: Payer: Self-pay | Admitting: Psychology

## 2022-07-04 ENCOUNTER — Ambulatory Visit (INDEPENDENT_AMBULATORY_CARE_PROVIDER_SITE_OTHER): Payer: No Typology Code available for payment source | Admitting: Psychology

## 2022-07-04 DIAGNOSIS — F320 Major depressive disorder, single episode, mild: Secondary | ICD-10-CM | POA: Diagnosis not present

## 2022-07-04 NOTE — Progress Notes (Signed)
PROGRESS NOTE:   Name: Olivia [name] Date: 07/04/2022 MRN: [id number] DOB: 1966-01-22 PCP: Leamon Arnt, MD  Time spent: 3:00 - 4:00 PM  Annual Review: 05/29/2023  Today I met with  Olivia [name] in remote video (WebEx) face-to-face individual psychotherapy.  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 She is normally a high functioning and driven individual and it is a struggle to get motivated to do anything. If it is necessary for work she does it and does a good job, but it is not the same. She has gained 70 pounds in less than 9 months and it has impacted her self image. She is also peri menopausal and these hormonal changes are also likely to be contributing to her changing mood states.   A year and a half ago her father had quadrupel by-pass surgery and he was recently diagnosed with cancer. 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  Olivia [name] states that she was diagnosed with adult ADHD and responded well to medications.    Mental Status Exam: Appearance:   Casual     Behavior:  Appropriate  Motor:  Normal  Speech/Language:   NA  Affect:  Appropriate  Mood:  normal  Thought process:  normal  Thought content:    WNL  Sensory/Perceptual disturbances:    WNL  Orientation:  oriented to person, place, time/date, situation, and day of week  Attention:  Good  Concentration:  Good  Memory:  WNL  Fund of knowledge:   Good  Insight:    Good  Judgment:   Good  Impulse Control:  Good    Risk Assessment: Danger to Self:  No Self-injurious Behavior: No Danger to Others: No Duty to Warn:no Physical Aggression / Violence:No  Substance Abuse History: Current substance abuse: No     Past Psychiatric History:   No previous  psychological problems have been observed Outpatient Providers: Current therapist History of Psych Hospitalization: No  Psychological Testing: Attention/ADHD:  ?    Abuse History:  Victim of: No.,  n/a    Report needed: No. Victim of Neglect:No. Witness / Exposure to Domestic Violence: No   Protective Services Involvement: No  Witness to [employer] Violence:  No   Family History:  Family History  Problem Relation Age of Onset   Diabetes Mother    Obesity Mother    Colon polyps Mother    Colon cancer Mother    Obesity Father    Hypertension Father    Obesity Sister     Living situation: the patient lives with their family  Sexual Orientation: Straight  Relationship Status: married  Name of spouse / other: Lanny Hurst (52) he is a Publishing copy and works for [employer], [employer]. He is worried about her and is doing an amazing job of taking care of her.   Olivia [name] was married early. She married her first husband and was a "scum bag." She quit school and was a single mom raising a child on her own. She managed to go back to school and got a nursing degree.   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)   Son: Nate (60) he is on the ASD, he attends  [employer]   Support Systems: spouse friends parents  Museum/gallery curator Stress:  No   Income/Employment/Disability: Employment  Armed forces logistics/support/administrative officer: No   Educational History: Education: post Forensic psychologist work or Public house manager: N/a  Any cultural differences that may affect / interfere with treatment:  n/a  Recreation/Hobbies: music, crafting  Stressors: conflict with daughter, work related stress  Strengths: Supportive Relationships, Family, Friends, Financial controller, Hopefulness, and Conservator, museum/gallery  Barriers:  none  Legal History: Pending legal issue / charges: The patient has no  significant history of legal issues. History of legal issue / charges:  n/a  Medical History/Surgical History: reviewed Past Medical History:  Diagnosis Date   Anxiety    Asthma    Environmental allergies    Hiatal hernia    Hiatal hernia without gangrene or obstruction 08/19/2018   History of parathyroid surgery 2021 01/17/2021   For primary hyperparathyroidism. Dr. Harlow Asa   HLD (hyperlipidemia) 04/15/2008   Hypertension    Microcytic anemia    Migraine, hormonal    Multiple thyroid nodules 08/19/2018   2016 IMPRESSION: Multinodular goiter with multiple bilateral thyroid nodules. These are generally round and hypoechoic. There is a mildly complex cyst in the right lobe measuring up to 1.1 cm. The largest solid nodule is in the inferior right lobe measures 1.8 x 1.1 x 1.4 cm. This could either be sampled or followed by ultrasound.   PAC (premature atrial contraction)    PACs (premature atrial contraction)    Palpitations    PCOS (polycystic ovarian syndrome) 10/25/2014   PONV (postoperative nausea and vomiting)    Pre-diabetes    Reflux    Seasonal allergies     Past Surgical History:  Procedure Laterality Date   HIATAL HERNIA REPAIR  04/14/2012   Procedure: LAPAROSCOPIC REPAIR OF HIATAL HERNIA;  Surgeon: Pedro Earls, MD;  Location: [facility];  Service: General;  Laterality: N/A;  Hiatal Hernia Repair-Replacement of Lap Band   LAPAROSCOPIC GASTRIC BANDING  08/08/2008   PARATHYROIDECTOMY Left 06/02/2020   Procedure: LEFT INFERIOR PARATHYROIDECTOMY;  Surgeon: Armandina Gemma, MD;  Location: [facility];  Service: General;  Laterality: Left;   THYROIDECTOMY N/A 06/02/2020   Procedure: TOTAL THYROIDECTOMY;  Surgeon: Armandina Gemma, MD;  Location: [facility];  Service: General;  Laterality: N/A;   TONSILLECTOMY  1981    Medications: Current Outpatient Medications  Medication Sig Dispense Refill   albuterol (PROVENTIL HFA) 108 (90 Base) MCG/ACT inhaler Inhale 2 puffs into the lungs every 6  (six) hours as needed for wheezing or shortness of breath. 18 g 2   albuterol (PROVENTIL) (2.5 MG/3ML) 0.083% nebulizer solution Take 3 mLs (2.5 mg total) by nebulization every 6 (six) hours as needed for wheezing or shortness of breath. 75 mL 12   ALPRAZolam (XANAX) 0.5 MG tablet Take 1 tablet (0.5 mg total) by mouth 2 (two) times daily as needed for anxiety. (Patient not taking: Reported on 11/06/2021) 30 tablet 0   buPROPion (WELLBUTRIN XL) 150 MG 24 hr tablet Take 1 tablet (150 mg total) by mouth daily. 90 tablet 3   cetirizine (ZYRTEC) 10 MG tablet Take 10 mg by mouth daily as needed for allergies.      diclofenac sodium (VOLTAREN) 1 % GEL Apply topically to affected area qid (Patient taking differently: Apply 1 application topically 4 (four) times daily as needed (pain).) 100 g 1   escitalopram (LEXAPRO) 20 MG tablet Take 1 tablet (20 mg total) by mouth daily. 90 tablet 1  famotidine (PEPCID) 20 MG tablet Take 20 mg by mouth daily as needed for heartburn or indigestion. (Patient not taking: Reported on 11/06/2021)     ferrous sulfate 325 (65 FE) MG EC tablet Take 325 mg by mouth daily with breakfast.     levothyroxine (SYNTHROID) 112 MCG tablet Take 1 tablet (112 mcg total) by mouth daily. 90 tablet 2   liothyronine (CYTOMEL) 25 MCG tablet Take 1 tablet by mouth once daily. 90 tablet 0   magnesium 30 MG tablet Take 30 mg by mouth daily.     metoprolol tartrate (LOPRESSOR) 25 MG tablet Take 12.5 mg by mouth daily as needed. (Patient not taking: Reported on 11/06/2021) 60 tablet 1   Multiple Vitamin (MULITIVITAMIN WITH MINERALS) TABS Take 1 tablet by mouth daily.     mupirocin cream (BACTROBAN) 2 % Apply 1 application topically 2 (two) times daily. 15 g 0   nitroGLYCERIN (NITRODUR - DOSED IN MG/24 HR) 0.2 mg/hr patch Apply 1/4 patch daily to tendon for tendonitis. 30 patch 1   Omega-3 Fatty Acids (FISH OIL) 1000 MG CAPS Take 1,000 mg by mouth daily. (Patient not taking: Reported on 11/06/2021)      rosuvastatin (CRESTOR) 10 MG tablet TAKE 1 TABLET (10 MG TOTAL) BY MOUTH DAILY. (Patient not taking: Reported on 11/06/2021) 90 tablet 3   SYNTHROID 112 MCG tablet 1 tablet in the morning on an empty stomach Orally Once a day 90 days 90 tablet 4   SYNTHROID 125 MCG tablet Take 1 tablet (125 mcg total) by mouth daily in the morning on an empty stomach. 30 tablet 11   Turmeric (QC TUMERIC COMPLEX PO) Take 1 capsule by mouth daily.     Vitamin D, Cholecalciferol, 50 MCG (2000 UT) CAPS Take 2,000 Units by mouth daily.     No current facility-administered medications for this visit.    Allergies  Allergen Reactions   Cephalosporins Anaphylaxis and Other (See Comments)    Caused hypotension and a fever   Eggs Or Egg-Derived Products Hives, Swelling and Other (See Comments)    Angioedema   Penicillins Anaphylaxis    Has patient had a PCN reaction causing immediate rash, facial/tongue/throat swelling, SOB or lightheadedness with hypotension: Yes Has patient had a PCN reaction causing severe rash involving mucus membranes or skin necrosis: No Has patient had a PCN reaction that required hospitalization: Observed X12 hrs Has patient had a PCN reaction occurring within the last 10 years: No If all of the above answers are "NO", then may proceed with Cephalosporin use.    Sulfa Drugs Cross Reactors Other (See Comments)    Liver failure   Adhesive [Tape] Hives and Itching    Cannot wear for any length of time   Effexor [Venlafaxine] Other (See Comments)    Lightheadedness, dizziness, and headaches resulted    Diagnoses:  Major depressive disorder, single episode, mild (HCC)   Individualized Treatment Plan                Strengths: bright, verbal, motivated, self aware, self advocate   Supports: spouse, family, friends, colleagues   Goal/Needs for Treatment:  In order of importance to patient  1) Develop healthy interpersonal relationships that lead to the alleviation and help prevent the  relapse of depression.  2) Learn and implement coping skills that result in a reduction of anxiety and worry, and improved functioning.   3) Combine skills learned in therapy into a new daily approach to managing ADHD.   Client Statement  of Needs: requests assistance with piecing back together her life as it once was before she got depressed (burnt out).   Treatment Level: Weekly Individual Outpatient Psychotherapy.  Symptoms: autonomic hyperactivity (e.g., palpitations, shortness of breath, dry mouth, trouble swallowing, nausea, diarrhea).  Childhood history of Attention Deficit Disorder (ADD) that was either diagnosed or later concluded due to the symptoms of behavioral problems at school, impulsivity, temper outbursts, and lack of concentration.  Depressed or irritable mood. Diminished interest in or enjoyment of activities. Easily distracted and drawn from task at hand.  Excessive and/or unrealistic worry that is difficult to control occurring more days than not for at least 6 months about a number of events or activities.  Feelings of hopelessness, worthlessness, or inappropriate guilt.  Lack of energy.   Poor concentration and indecisiveness.  Restless and fidgety; unable to be sedentary for more than a short time.   Client Treatment Preferences: Continue with current therapist   Healthcare consumer's goal for treatment:  Psychologist, [name], Ph.D. will support the patient's ability to achieve the goals identified. Cognitive Behavioral Therapy, Dialectical Behavioral Therapy, Motivational Interviewing, SPACE, parent training, and other evidenced-based practices will be used to promote progress towards healthy functioning.   Healthcare consumer Olivia [name] will: Actively participate in therapy, working towards healthy functioning.    *Justification for Continuation/Discontinuation of Goal: R=Revised, O=Ongoing, A=Achieved, D=Discontinued  Goal 1) Develop  healthy interpersonal relationships that lead to the alleviation and help prevent the relapse of depression.  5 Point Likert rating baseline date: 05/28/2021 Target Date Goal Was reviewed Status Code Progress towards goal/Likert rating  05/29/2023 05/28/2022         O 3/5 - learning to set better limits and boundaries, still hasn't been able to regulate her daily routines             Goal 2) Learn and implement coping skills that result in a reduction of anxiety and worry, and improved daily functioning.  5 Point Likert rating baseline date: 05/28/2021  Target Date Goal Was reviewed Status Code Progress towards goal/Likert rating  05/29/2023 05/28/2022          O 2/5 - learning to use her skills more consistently             Goal 3) Combine skills learned in therapy into a new daily approach to managing ADHD.  5 Point Likert rating baseline date: 05/28/2021  Target Date Goal Was reviewed Status Code Progress towards goal/Likert rating  05/29/2023 05/28/2022          O 2/5 - learning to use her skills more consistently             This plan has been reviewed and created by the following participants:  This plan will be reviewed at least every 12 months. Date Behavioral Health Clinician Date Guardian/Patient   05/28/2022 [name], Ph.D.   05/28/2022 [name]" Eriyanna Kofoed reports that her father came through his surgery relatively well.  There were some unexpected complications but these were able to be quickly addressed.  We d/p her anxieties for her father, his recovery and her mother's stress level.  We d/ how she might be supportive w/o taking over.    [name], PhD

## 2022-07-09 ENCOUNTER — Ambulatory Visit (INDEPENDENT_AMBULATORY_CARE_PROVIDER_SITE_OTHER): Payer: No Typology Code available for payment source | Admitting: Psychology

## 2022-07-09 DIAGNOSIS — F320 Major depressive disorder, single episode, mild: Secondary | ICD-10-CM

## 2022-07-09 NOTE — Progress Notes (Signed)
PROGRESS NOTE:   Name: Olivia [name] Date: 07/09/2022 MRN: [id number] DOB: 02-21-1966 PCP: Leamon Arnt, MD  Time spent: 3:00 - 4:00 PM  Annual Review: 05/29/2023  Today I met with  Olivia [name] in remote video (WebEx) face-to-face individual psychotherapy.  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 She is normally a high functioning and driven individual and it is a struggle to get motivated to do anything. If it is necessary for work she does it and does a good job, but it is not the same. She has gained 70 pounds in less than 9 months and it has impacted her self image. She is also peri menopausal and these hormonal changes are also likely to be contributing to her changing mood [name].   A year and a half ago her father had quadrupel by-pass surgery and he was recently diagnosed with cancer. 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  Olivia [name] that she was diagnosed with adult ADHD and responded well to medications.    Mental Status Exam: Appearance:   Casual     Behavior:  Appropriate  Motor:  Normal  Speech/Language:   NA  Affect:  Appropriate  Mood:  normal  Thought process:  normal  Thought content:    WNL  Sensory/Perceptual disturbances:    WNL  Orientation:  oriented to person, place, time/date, situation, and day of week  Attention:  Good  Concentration:  Good  Memory:  WNL  Fund of knowledge:   Good  Insight:    Good  Judgment:   Good  Impulse Control:  Good    Risk Assessment: Danger to Self:  No Self-injurious Behavior: No Danger to Others: No Duty to Warn:no Physical Aggression / Violence:No  Substance Abuse History: Current substance abuse: No     Past Psychiatric History:   No previous  psychological problems have been observed Outpatient Providers: Current therapist History of Psych Hospitalization: No  Psychological Testing: Attention/ADHD:  ?    Abuse History:  Victim of: No.,  n/a    Report needed: No. Victim of Neglect:No. Witness / Exposure to Domestic Violence: No   Protective Services Involvement: No  Witness to [employer] Violence:  No   Family History:  Family History  Problem Relation Age of Onset   Diabetes Mother    Obesity Mother    Colon polyps Mother    Colon cancer Mother    Obesity Father    Hypertension Father    Obesity Sister     Living situation: the patient lives with their family  Sexual Orientation: Straight  Relationship Status: married  Name of spouse / other: Olivia [name] (52) he is a Publishing copy and works for [employer], [employer]. He is worried about her and is doing an amazing job of taking care of her.   Olivia [name] was married early. She married her first husband and was a "scum bag." She quit school and was a single mom raising a child on her own. She managed to go back to school and got a nursing degree.   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)   Son: Olivia [name] (76) he is on the ASD, he attends  [employer]   Support Systems: spouse friends parents  Museum/gallery curator Stress:  No   Income/Employment/Disability: Employment  Armed forces logistics/support/administrative officer: No   Educational History: Education: post Forensic psychologist work or Public house manager: N/a  Any cultural differences that may affect / interfere with treatment:  n/a  Recreation/Hobbies: music, crafting  Stressors: conflict with daughter, work related stress  Strengths: Supportive Relationships, Family, Friends, Financial controller, Hopefulness, and Conservator, museum/gallery  Barriers:  none  Legal History: Pending legal issue / charges: The patient has no  significant history of legal issues. History of legal issue / charges:  n/a  Medical History/Surgical History: reviewed Past Medical History:  Diagnosis Date   Anxiety    Asthma    Environmental allergies    Hiatal hernia    Hiatal hernia without gangrene or obstruction 08/19/2018   History of parathyroid surgery 2021 01/17/2021   For primary hyperparathyroidism. Dr. Harlow Asa   HLD (hyperlipidemia) 04/15/2008   Hypertension    Microcytic anemia    Migraine, hormonal    Multiple thyroid nodules 08/19/2018   2016 IMPRESSION: Multinodular goiter with multiple bilateral thyroid nodules. These are generally round and hypoechoic. There is a mildly complex cyst in the right lobe measuring up to 1.1 cm. The largest solid nodule is in the inferior right lobe measures 1.8 x 1.1 x 1.4 cm. This could either be sampled or followed by ultrasound.   PAC (premature atrial contraction)    PACs (premature atrial contraction)    Palpitations    PCOS (polycystic ovarian syndrome) 10/25/2014   PONV (postoperative nausea and vomiting)    Pre-diabetes    Reflux    Seasonal allergies     Past Surgical History:  Procedure Laterality Date   HIATAL HERNIA REPAIR  04/14/2012   Procedure: LAPAROSCOPIC REPAIR OF HIATAL HERNIA;  Surgeon: Pedro Earls, MD;  Location: [facility];  Service: General;  Laterality: N/A;  Hiatal Hernia Repair-Replacement of Lap Band   LAPAROSCOPIC GASTRIC BANDING  08/08/2008   PARATHYROIDECTOMY Left 06/02/2020   Procedure: LEFT INFERIOR PARATHYROIDECTOMY;  Surgeon: Armandina Gemma, MD;  Location: [facility];  Service: General;  Laterality: Left;   THYROIDECTOMY N/A 06/02/2020   Procedure: TOTAL THYROIDECTOMY;  Surgeon: Armandina Gemma, MD;  Location: [facility];  Service: General;  Laterality: N/A;   TONSILLECTOMY  1981    Medications: Current Outpatient Medications  Medication Sig Dispense Refill   albuterol (PROVENTIL HFA) 108 (90 Base) MCG/ACT inhaler Inhale 2 puffs into the lungs every 6  (six) hours as needed for wheezing or shortness of breath. 18 g 2   albuterol (PROVENTIL) (2.5 MG/3ML) 0.083% nebulizer solution Take 3 mLs (2.5 mg total) by nebulization every 6 (six) hours as needed for wheezing or shortness of breath. 75 mL 12   ALPRAZolam (XANAX) 0.5 MG tablet Take 1 tablet (0.5 mg total) by mouth 2 (two) times daily as needed for anxiety. (Patient not taking: Reported on 11/06/2021) 30 tablet 0   buPROPion (WELLBUTRIN XL) 150 MG 24 hr tablet Take 1 tablet (150 mg total) by mouth daily. 90 tablet 3   cetirizine (ZYRTEC) 10 MG tablet Take 10 mg by mouth daily as needed for allergies.      diclofenac sodium (VOLTAREN) 1 % GEL Apply topically to affected area qid (Patient taking differently: Apply 1 application topically 4 (four) times daily as needed (pain).) 100 g 1   escitalopram (LEXAPRO) 20 MG tablet Take 1 tablet (20 mg total) by mouth daily. 90 tablet 1  famotidine (PEPCID) 20 MG tablet Take 20 mg by mouth daily as needed for heartburn or indigestion. (Patient not taking: Reported on 11/06/2021)     ferrous sulfate 325 (65 FE) MG EC tablet Take 325 mg by mouth daily with breakfast.     levothyroxine (SYNTHROID) 112 MCG tablet Take 1 tablet (112 mcg total) by mouth daily. 90 tablet 2   liothyronine (CYTOMEL) 25 MCG tablet Take 1 tablet by mouth once daily. 90 tablet 0   magnesium 30 MG tablet Take 30 mg by mouth daily.     metoprolol tartrate (LOPRESSOR) 25 MG tablet Take 12.5 mg by mouth daily as needed. (Patient not taking: Reported on 11/06/2021) 60 tablet 1   Multiple Vitamin (MULITIVITAMIN WITH MINERALS) TABS Take 1 tablet by mouth daily.     mupirocin cream (BACTROBAN) 2 % Apply 1 application topically 2 (two) times daily. 15 g 0   nitroGLYCERIN (NITRODUR - DOSED IN MG/24 HR) 0.2 mg/hr patch Apply 1/4 patch daily to tendon for tendonitis. 30 patch 1   Omega-3 Fatty Acids (FISH OIL) 1000 MG CAPS Take 1,000 mg by mouth daily. (Patient not taking: Reported on 11/06/2021)      rosuvastatin (CRESTOR) 10 MG tablet TAKE 1 TABLET (10 MG TOTAL) BY MOUTH DAILY. (Patient not taking: Reported on 11/06/2021) 90 tablet 3   SYNTHROID 112 MCG tablet 1 tablet in the morning on an empty stomach Orally Once a day 90 days 90 tablet 4   SYNTHROID 125 MCG tablet Take 1 tablet (125 mcg total) by mouth daily in the morning on an empty stomach. 30 tablet 11   Turmeric (QC TUMERIC COMPLEX PO) Take 1 capsule by mouth daily.     Vitamin D, Cholecalciferol, 50 MCG (2000 UT) CAPS Take 2,000 Units by mouth daily.     No current facility-administered medications for this visit.    Allergies  Allergen Reactions   Cephalosporins Anaphylaxis and Other (See Comments)    Caused hypotension and a fever   Eggs Or Egg-Derived Products Hives, Swelling and Other (See Comments)    Angioedema   Penicillins Anaphylaxis    Has patient had a PCN reaction causing immediate rash, facial/tongue/throat swelling, SOB or lightheadedness with hypotension: Yes Has patient had a PCN reaction causing severe rash involving mucus membranes or skin necrosis: No Has patient had a PCN reaction that required hospitalization: Observed X12 hrs Has patient had a PCN reaction occurring within the last 10 years: No If all of the above answers are "NO", then may proceed with Cephalosporin use.    Sulfa Drugs Cross Reactors Other (See Comments)    Liver failure   Adhesive [Tape] Hives and Itching    Cannot wear for any length of time   Effexor [Venlafaxine] Other (See Comments)    Lightheadedness, dizziness, and headaches resulted    Diagnoses:  Major depressive disorder, single episode, mild (HCC)   Individualized Treatment Plan                Strengths: bright, verbal, motivated, self aware, self advocate   Supports: spouse, family, friends, colleagues   Goal/Needs for Treatment:  In order of importance to patient  1) Develop healthy interpersonal relationships that lead to the alleviation and help prevent the  relapse of depression.  2) Learn and implement coping skills that result in a reduction of anxiety and worry, and improved functioning.   3) Combine skills learned in therapy into a new daily approach to managing ADHD.   Client Statement  of Needs: requests assistance with piecing back together her life as it once was before she got depressed (burnt out).   Treatment Level: Weekly Individual Outpatient Psychotherapy.  Symptoms: autonomic hyperactivity (e.g., palpitations, shortness of breath, dry mouth, trouble swallowing, nausea, diarrhea).  Childhood history of Attention Deficit Disorder (ADD) that was either diagnosed or later concluded due to the symptoms of behavioral problems at school, impulsivity, temper outbursts, and lack of concentration.  Depressed or irritable mood. Diminished interest in or enjoyment of activities. Easily distracted and drawn from task at hand.  Excessive and/or unrealistic worry that is difficult to control occurring more days than not for at least 6 months about a number of events or activities.  Feelings of hopelessness, worthlessness, or inappropriate guilt.  Lack of energy.   Poor concentration and indecisiveness.  Restless and fidgety; unable to be sedentary for more than a short time.   Client Treatment Preferences: Continue with current therapist   Healthcare consumer's goal for treatment:  Psychologist, [name], Ph.D. will support the patient's ability to achieve the goals identified. Cognitive Behavioral Therapy, Dialectical Behavioral Therapy, Motivational Interviewing, SPACE, parent training, and other evidenced-based practices will be used to promote progress towards healthy functioning.   Healthcare consumer Olivia [name] will: Actively participate in therapy, working towards healthy functioning.    *Justification for Continuation/Discontinuation of Goal: R=Revised, O=Ongoing, A=Achieved, D=Discontinued  Goal 1) Develop  healthy interpersonal relationships that lead to the alleviation and help prevent the relapse of depression.  5 Point Likert rating baseline date: 05/28/2021 Target Date Goal Was reviewed Status Code Progress towards goal/Likert rating  05/29/2023 05/28/2022         O 3/5 - learning to set better limits and boundaries, still hasn't been able to regulate her daily routines             Goal 2) Learn and implement coping skills that result in a reduction of anxiety and worry, and improved daily functioning.  5 Point Likert rating baseline date: 05/28/2021  Target Date Goal Was reviewed Status Code Progress towards goal/Likert rating  05/29/2023 05/28/2022          O 2/5 - learning to use her skills more consistently             Goal 3) Combine skills learned in therapy into a new daily approach to managing ADHD.  5 Point Likert rating baseline date: 05/28/2021  Target Date Goal Was reviewed Status Code Progress towards goal/Likert rating  05/29/2023 05/28/2022          O 2/5 - learning to use her skills more consistently             This plan has been reviewed and created by the following participants:  This plan will be reviewed at least every 12 months. Date Behavioral Health Clinician Date Guardian/Patient   05/28/2022 [name], Ph.D.   05/28/2022 Olivia Kid" Hedy Garro reports that she is getting better at not "going above and beyond."  She is doing a better job at using her self talk to keep her expectations in line.  She was being hard on herself, while not immediately, she was eventually able to use her self talk to better manage her negative thinking.    Lastly, Cathy admitted that she ahs fallen off the Three Things a day train.  We d/ how to get  back on track and I had her create her list for the following day.  [name], PhD

## 2022-07-12 LAB — HM MAMMOGRAPHY

## 2022-07-16 ENCOUNTER — Ambulatory Visit (INDEPENDENT_AMBULATORY_CARE_PROVIDER_SITE_OTHER): Payer: No Typology Code available for payment source | Admitting: Psychology

## 2022-07-16 DIAGNOSIS — F320 Major depressive disorder, single episode, mild: Secondary | ICD-10-CM

## 2022-07-16 NOTE — Progress Notes (Signed)
PROGRESS NOTE:   Name: Olivia [name] Date: 07/16/2022 MRN: [id number] DOB: 08-14-1966 PCP: Leamon Arnt, MD  Time spent: 4:00 - 4:55 PM  Annual Review: 05/29/2023  Today I met with  Olivia [name] in remote video (WebEx) face-to-face individual psychotherapy.  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 She is normally a high functioning and driven individual and it is a struggle to get motivated to do anything. If it is necessary for work she does it and does a good job, but it is not the same. She has gained 70 pounds in less than 9 months and it has impacted her self image. She is also peri menopausal and these hormonal changes are also likely to be contributing to her changing mood states.   A year and a half ago her father had quadrupel by-pass surgery and he was recently diagnosed with cancer. 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  [name] states that she was diagnosed with adult ADHD and responded well to medications.    Mental Status Exam: Appearance:   Casual     Behavior:  Appropriate  Motor:  Normal  Speech/Language:   NA  Affect:  Appropriate  Mood:  normal  Thought process:  normal  Thought content:    WNL  Sensory/Perceptual disturbances:    WNL  Orientation:  oriented to person, place, time/date, situation, and day of week  Attention:  Good  Concentration:  Good  Memory:  WNL  Fund of knowledge:   Good  Insight:    Good  Judgment:   Good  Impulse Control:  Good    Risk Assessment: Danger to Self:  No Self-injurious Behavior: No Danger to Others: No Duty to Warn:no Physical Aggression / Violence:No  Substance Abuse History: Current substance abuse: No     Past Psychiatric History:   No previous  psychological problems have been observed Outpatient Providers: Current therapist History of Psych Hospitalization: No  Psychological Testing: Attention/ADHD:  ?    Abuse History:  Victim of: No.,  n/a    Report needed: No. Victim of Neglect:No. Witness / Exposure to Domestic Violence: No   Protective Services Involvement: No  Witness to [employer] Violence:  No   Family History:  Family History  Problem Relation Age of Onset   Diabetes Mother    Obesity Mother    Colon polyps Mother    Colon cancer Mother    Obesity Father    Hypertension Father    Obesity Sister     Living situation: the patient lives with their family  Sexual Orientation: Straight  Relationship Status: married  Name of spouse / other: Olivia [name] (52) he is a Publishing copy and works for [employer], [employer]. He is worried about her and is doing an amazing job of taking care of her.   [name] was married early. She married her first husband and was a "scum bag." She quit school and was a single mom raising a child on her own. She managed to go back to school and got a nursing degree.   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)   Son: Nate (47) he is on the ASD, he attends  [employer]   Support Systems: spouse friends parents  Museum/gallery curator Stress:  No   Income/Employment/Disability: Employment  Armed forces logistics/support/administrative officer: No   Educational History: Education: post Forensic psychologist work or Public house manager: N/a  Any cultural differences that may affect / interfere with treatment:  n/a  Recreation/Hobbies: music, crafting  Stressors: conflict with daughter, work related stress  Strengths: Supportive Relationships, Family, Friends, Financial controller, Hopefulness, and Conservator, museum/gallery  Barriers:  none  Legal History: Pending legal issue / charges: The patient has no  significant history of legal issues. History of legal issue / charges:  n/a  Medical History/Surgical History: reviewed Past Medical History:  Diagnosis Date   Anxiety    Asthma    Environmental allergies    Hiatal hernia    Hiatal hernia without gangrene or obstruction 08/19/2018   History of parathyroid surgery 2021 01/17/2021   For primary hyperparathyroidism. Dr. Harlow Asa   HLD (hyperlipidemia) 04/15/2008   Hypertension    Microcytic anemia    Migraine, hormonal    Multiple thyroid nodules 08/19/2018   2016 IMPRESSION: Multinodular goiter with multiple bilateral thyroid nodules. These are generally round and hypoechoic. There is a mildly complex cyst in the right lobe measuring up to 1.1 cm. The largest solid nodule is in the inferior right lobe measures 1.8 x 1.1 x 1.4 cm. This could either be sampled or followed by ultrasound.   PAC (premature atrial contraction)    PACs (premature atrial contraction)    Palpitations    PCOS (polycystic ovarian syndrome) 10/25/2014   PONV (postoperative nausea and vomiting)    Pre-diabetes    Reflux    Seasonal allergies     Past Surgical History:  Procedure Laterality Date   HIATAL HERNIA REPAIR  04/14/2012   Procedure: LAPAROSCOPIC REPAIR OF HIATAL HERNIA;  Surgeon: Pedro Earls, MD;  Location: [facility];  Service: General;  Laterality: N/A;  Hiatal Hernia Repair-Replacement of Lap Band   LAPAROSCOPIC GASTRIC BANDING  08/08/2008   PARATHYROIDECTOMY Left 06/02/2020   Procedure: LEFT INFERIOR PARATHYROIDECTOMY;  Surgeon: Armandina Gemma, MD;  Location: [facility];  Service: General;  Laterality: Left;   THYROIDECTOMY N/A 06/02/2020   Procedure: TOTAL THYROIDECTOMY;  Surgeon: Armandina Gemma, MD;  Location: [facility];  Service: General;  Laterality: N/A;   TONSILLECTOMY  1981    Medications: Current Outpatient Medications  Medication Sig Dispense Refill   albuterol (PROVENTIL HFA) 108 (90 Base) MCG/ACT inhaler Inhale 2 puffs into the lungs every 6  (six) hours as needed for wheezing or shortness of breath. 18 g 2   albuterol (PROVENTIL) (2.5 MG/3ML) 0.083% nebulizer solution Take 3 mLs (2.5 mg total) by nebulization every 6 (six) hours as needed for wheezing or shortness of breath. 75 mL 12   ALPRAZolam (XANAX) 0.5 MG tablet Take 1 tablet (0.5 mg total) by mouth 2 (two) times daily as needed for anxiety. (Patient not taking: Reported on 11/06/2021) 30 tablet 0   buPROPion (WELLBUTRIN XL) 150 MG 24 hr tablet Take 1 tablet (150 mg total) by mouth daily. 90 tablet 3   cetirizine (ZYRTEC) 10 MG tablet Take 10 mg by mouth daily as needed for allergies.      diclofenac sodium (VOLTAREN) 1 % GEL Apply topically to affected area qid (Patient taking differently: Apply 1 application topically 4 (four) times daily as needed (pain).) 100 g 1   escitalopram (LEXAPRO) 20 MG tablet Take 1 tablet (20 mg total) by mouth daily. 90 tablet 1  famotidine (PEPCID) 20 MG tablet Take 20 mg by mouth daily as needed for heartburn or indigestion. (Patient not taking: Reported on 11/06/2021)     ferrous sulfate 325 (65 FE) MG EC tablet Take 325 mg by mouth daily with breakfast.     levothyroxine (SYNTHROID) 112 MCG tablet Take 1 tablet (112 mcg total) by mouth daily. 90 tablet 2   liothyronine (CYTOMEL) 25 MCG tablet Take 1 tablet by mouth once daily. 90 tablet 0   magnesium 30 MG tablet Take 30 mg by mouth daily.     metoprolol tartrate (LOPRESSOR) 25 MG tablet Take 12.5 mg by mouth daily as needed. (Patient not taking: Reported on 11/06/2021) 60 tablet 1   Multiple Vitamin (MULITIVITAMIN WITH MINERALS) TABS Take 1 tablet by mouth daily.     mupirocin cream (BACTROBAN) 2 % Apply 1 application topically 2 (two) times daily. 15 g 0   nitroGLYCERIN (NITRODUR - DOSED IN MG/24 HR) 0.2 mg/hr patch Apply 1/4 patch daily to tendon for tendonitis. 30 patch 1   Omega-3 Fatty Acids (FISH OIL) 1000 MG CAPS Take 1,000 mg by mouth daily. (Patient not taking: Reported on 11/06/2021)      rosuvastatin (CRESTOR) 10 MG tablet TAKE 1 TABLET (10 MG TOTAL) BY MOUTH DAILY. (Patient not taking: Reported on 11/06/2021) 90 tablet 3   SYNTHROID 112 MCG tablet 1 tablet in the morning on an empty stomach Orally Once a day 90 days 90 tablet 4   SYNTHROID 125 MCG tablet Take 1 tablet (125 mcg total) by mouth daily in the morning on an empty stomach. 30 tablet 11   Turmeric (QC TUMERIC COMPLEX PO) Take 1 capsule by mouth daily.     Vitamin D, Cholecalciferol, 50 MCG (2000 UT) CAPS Take 2,000 Units by mouth daily.     No current facility-administered medications for this visit.    Allergies  Allergen Reactions   Cephalosporins Anaphylaxis and Other (See Comments)    Caused hypotension and a fever   Eggs Or Egg-Derived Products Hives, Swelling and Other (See Comments)    Angioedema   Penicillins Anaphylaxis    Has patient had a PCN reaction causing immediate rash, facial/tongue/throat swelling, SOB or lightheadedness with hypotension: Yes Has patient had a PCN reaction causing severe rash involving mucus membranes or skin necrosis: No Has patient had a PCN reaction that required hospitalization: Observed X12 hrs Has patient had a PCN reaction occurring within the last 10 years: No If all of the above answers are "NO", then may proceed with Cephalosporin use.    Sulfa Drugs Cross Reactors Other (See Comments)    Liver failure   Adhesive [Tape] Hives and Itching    Cannot wear for any length of time   Effexor [Venlafaxine] Other (See Comments)    Lightheadedness, dizziness, and headaches resulted    Diagnoses:  Major depressive disorder, single episode, mild (HCC)   Individualized Treatment Plan                Strengths: bright, verbal, motivated, self aware, self advocate   Supports: spouse, family, friends, colleagues   Goal/Needs for Treatment:  In order of importance to patient  1) Develop healthy interpersonal relationships that lead to the alleviation and help prevent the  relapse of depression.  2) Learn and implement coping skills that result in a reduction of anxiety and worry, and improved functioning.   3) Combine skills learned in therapy into a new daily approach to managing ADHD.   Client Statement  of Needs: requests assistance with piecing back together her life as it once was before she got depressed (burnt out).   Treatment Level: Weekly Individual Outpatient Psychotherapy.  Symptoms: autonomic hyperactivity (e.g., palpitations, shortness of breath, dry mouth, trouble swallowing, nausea, diarrhea).  Childhood history of Attention Deficit Disorder (ADD) that was either diagnosed or later concluded due to the symptoms of behavioral problems at school, impulsivity, temper outbursts, and lack of concentration.  Depressed or irritable mood. Diminished interest in or enjoyment of activities. Easily distracted and drawn from task at hand.  Excessive and/or unrealistic worry that is difficult to control occurring more days than not for at least 6 months about a number of events or activities.  Feelings of hopelessness, worthlessness, or inappropriate guilt.  Lack of energy.   Poor concentration and indecisiveness.  Restless and fidgety; unable to be sedentary for more than a short time.   Client Treatment Preferences: Continue with current therapist   Healthcare consumer's goal for treatment:  Psychologist, [name], Ph.D. will support the patient's ability to achieve the goals identified. Cognitive Behavioral Therapy, Dialectical Behavioral Therapy, Motivational Interviewing, SPACE, parent training, and other evidenced-based practices will be used to promote progress towards healthy functioning.   Healthcare consumer Saraann "[name]" Pfefferle will: Actively participate in therapy, working towards healthy functioning.    *Justification for Continuation/Discontinuation of Goal: R=Revised, O=Ongoing, A=Achieved, D=Discontinued  Goal 1) Develop  healthy interpersonal relationships that lead to the alleviation and help prevent the relapse of depression.  5 Point Likert rating baseline date: 05/28/2021 Target Date Goal Was reviewed Status Code Progress towards goal/Likert rating  05/29/2023 05/28/2022         O 3/5 - learning to set better limits and boundaries, still hasn't been able to regulate her daily routines             Goal 2) Learn and implement coping skills that result in a reduction of anxiety and worry, and improved daily functioning.  5 Point Likert rating baseline date: 05/28/2021  Target Date Goal Was reviewed Status Code Progress towards goal/Likert rating  05/29/2023 05/28/2022          O 2/5 - learning to use her skills more consistently             Goal 3) Combine skills learned in therapy into a new daily approach to managing ADHD.  5 Point Likert rating baseline date: 05/28/2021  Target Date Goal Was reviewed Status Code Progress towards goal/Likert rating  05/29/2023 05/28/2022          O 2/5 - learning to use her skills more consistently             This plan has been reviewed and created by the following participants:  This plan will be reviewed at least every 12 months. Date Behavioral Health Clinician Date Guardian/Patient   05/28/2022 [name], Ph.D.   05/28/2022 [name]" Jonquil Stubbe reports that she had a rough week.  Her father fell and she had to take him to the ED.  We d/e/p caring for her aging parents and all its stresses.    [name] made getting sleep a priority this week.  As we talked about the change she is making, we were able to see that when she sleeps she gets a  lot done.  It took making inquires for her to make that  realization.     [name], PhD

## 2022-07-23 ENCOUNTER — Ambulatory Visit (INDEPENDENT_AMBULATORY_CARE_PROVIDER_SITE_OTHER): Payer: No Typology Code available for payment source | Admitting: Psychology

## 2022-07-23 DIAGNOSIS — F411 Generalized anxiety disorder: Secondary | ICD-10-CM | POA: Diagnosis not present

## 2022-07-23 NOTE — Progress Notes (Signed)
PROGRESS NOTE:   Name: FERNANDE TREIBER Date: 07/23/2022 MRN: [id number] DOB: 09/17/1966 PCP: Leamon Arnt, MD  Time spent: 4:00 - 4:55 PM  Annual Review: 05/29/2023  Today I met with  [name] in remote video (WebEx) face-to-face individual psychotherapy.  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 She is normally a high functioning and driven individual and it is a struggle to get motivated to do anything. If it is necessary for work she does it and does a good job, but it is not the same. She has gained 70 pounds in less than 9 months and it has impacted her self image. She is also peri menopausal and these hormonal changes are also likely to be contributing to her changing mood states.   A year and a half ago her father had quadrupel by-pass surgery and he was recently diagnosed with cancer. 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  Mintie states that she was diagnosed with adult ADHD and responded well to medications.    Mental Status Exam: Appearance:   Casual     Behavior:  Appropriate  Motor:  Normal  Speech/Language:   NA  Affect:  Appropriate  Mood:  normal  Thought process:  normal  Thought content:    WNL  Sensory/Perceptual disturbances:    WNL  Orientation:  oriented to person, place, time/date, situation, and day of week  Attention:  Good  Concentration:  Good  Memory:  WNL  Fund of knowledge:   Good  Insight:    Good  Judgment:   Good  Impulse Control:  Good    Risk Assessment: Danger to Self:  No Self-injurious Behavior: No Danger to Others: No Duty to Warn:no Physical Aggression / Violence:No  Substance Abuse History: Current substance abuse: No     Past Psychiatric History:   No previous  psychological problems have been observed Outpatient Providers: Current therapist History of Psych Hospitalization: No  Psychological Testing: Attention/ADHD:  ?    Abuse History:  Victim of: No.,  n/a    Report needed: No. Victim of Neglect:No. Witness / Exposure to Domestic Violence: No   Protective Services Involvement: No  Witness to [employer] Violence:  No   Family History:  Family History  Problem Relation Age of Onset   Diabetes Mother    Obesity Mother    Colon polyps Mother    Colon cancer Mother    Obesity Father    Hypertension Father    Obesity Sister     Living situation: the patient lives with their family  Sexual Orientation: Straight  Relationship Status: married  Name of spouse / other: Lanny Hurst (52) he is a Publishing copy and works for [employer], [employer]. He is worried about her and is doing an amazing job of taking care of her.   Pascha was married early. She married her first husband and was a "scum bag." She quit school and was a single mom raising a child on her own. She managed to go back to school and got a nursing degree.   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)   Son: Nate (70) he is on the ASD, he attends  [employer]   Support Systems: spouse friends parents  Museum/gallery curator Stress:  No   Income/Employment/Disability: Employment  Armed forces logistics/support/administrative officer: No   Educational History: Education: post Forensic psychologist work or Public house manager: N/a  Any cultural differences that may affect / interfere with treatment:  n/a  Recreation/Hobbies: music, crafting  Stressors: conflict with daughter, work related stress  Strengths: Supportive Relationships, Family, Friends, Financial controller, Hopefulness, and Conservator, museum/gallery  Barriers:  none  Legal History: Pending legal issue / charges: The patient has no  significant history of legal issues. History of legal issue / charges:  n/a  Medical History/Surgical History: reviewed Past Medical History:  Diagnosis Date   Anxiety    Asthma    Environmental allergies    Hiatal hernia    Hiatal hernia without gangrene or obstruction 08/19/2018   History of parathyroid surgery 2021 01/17/2021   For primary hyperparathyroidism. Dr. Harlow Asa   HLD (hyperlipidemia) 04/15/2008   Hypertension    Microcytic anemia    Migraine, hormonal    Multiple thyroid nodules 08/19/2018   2016 IMPRESSION: Multinodular goiter with multiple bilateral thyroid nodules. These are generally round and hypoechoic. There is a mildly complex cyst in the right lobe measuring up to 1.1 cm. The largest solid nodule is in the inferior right lobe measures 1.8 x 1.1 x 1.4 cm. This could either be sampled or followed by ultrasound.   PAC (premature atrial contraction)    PACs (premature atrial contraction)    Palpitations    PCOS (polycystic ovarian syndrome) 10/25/2014   PONV (postoperative nausea and vomiting)    Pre-diabetes    Reflux    Seasonal allergies     Past Surgical History:  Procedure Laterality Date   HIATAL HERNIA REPAIR  04/14/2012   Procedure: LAPAROSCOPIC REPAIR OF HIATAL HERNIA;  Surgeon: Pedro Earls, MD;  Location: [facility];  Service: General;  Laterality: N/A;  Hiatal Hernia Repair-Replacement of Lap Band   LAPAROSCOPIC GASTRIC BANDING  08/08/2008   PARATHYROIDECTOMY Left 06/02/2020   Procedure: LEFT INFERIOR PARATHYROIDECTOMY;  Surgeon: Armandina Gemma, MD;  Location: [facility];  Service: General;  Laterality: Left;   THYROIDECTOMY N/A 06/02/2020   Procedure: TOTAL THYROIDECTOMY;  Surgeon: Armandina Gemma, MD;  Location: [facility];  Service: General;  Laterality: N/A;   TONSILLECTOMY  1981    Medications: Current Outpatient Medications  Medication Sig Dispense Refill   albuterol (PROVENTIL HFA) 108 (90 Base) MCG/ACT inhaler Inhale 2 puffs into the lungs every 6  (six) hours as needed for wheezing or shortness of breath. 18 g 2   albuterol (PROVENTIL) (2.5 MG/3ML) 0.083% nebulizer solution Take 3 mLs (2.5 mg total) by nebulization every 6 (six) hours as needed for wheezing or shortness of breath. 75 mL 12   ALPRAZolam (XANAX) 0.5 MG tablet Take 1 tablet (0.5 mg total) by mouth 2 (two) times daily as needed for anxiety. (Patient not taking: Reported on 11/06/2021) 30 tablet 0   buPROPion (WELLBUTRIN XL) 150 MG 24 hr tablet Take 1 tablet (150 mg total) by mouth daily. 90 tablet 3   cetirizine (ZYRTEC) 10 MG tablet Take 10 mg by mouth daily as needed for allergies.      diclofenac sodium (VOLTAREN) 1 % GEL Apply topically to affected area qid (Patient taking differently: Apply 1 application topically 4 (four) times daily as needed (pain).) 100 g 1   escitalopram (LEXAPRO) 20 MG tablet Take 1 tablet (20 mg total) by mouth daily. 90 tablet 1  famotidine (PEPCID) 20 MG tablet Take 20 mg by mouth daily as needed for heartburn or indigestion. (Patient not taking: Reported on 11/06/2021)     ferrous sulfate 325 (65 FE) MG EC tablet Take 325 mg by mouth daily with breakfast.     levothyroxine (SYNTHROID) 112 MCG tablet Take 1 tablet (112 mcg total) by mouth daily. 90 tablet 2   liothyronine (CYTOMEL) 25 MCG tablet Take 1 tablet by mouth once daily. 90 tablet 0   magnesium 30 MG tablet Take 30 mg by mouth daily.     metoprolol tartrate (LOPRESSOR) 25 MG tablet Take 12.5 mg by mouth daily as needed. (Patient not taking: Reported on 11/06/2021) 60 tablet 1   Multiple Vitamin (MULITIVITAMIN WITH MINERALS) TABS Take 1 tablet by mouth daily.     mupirocin cream (BACTROBAN) 2 % Apply 1 application topically 2 (two) times daily. 15 g 0   nitroGLYCERIN (NITRODUR - DOSED IN MG/24 HR) 0.2 mg/hr patch Apply 1/4 patch daily to tendon for tendonitis. 30 patch 1   Omega-3 Fatty Acids (FISH OIL) 1000 MG CAPS Take 1,000 mg by mouth daily. (Patient not taking: Reported on 11/06/2021)      rosuvastatin (CRESTOR) 10 MG tablet TAKE 1 TABLET (10 MG TOTAL) BY MOUTH DAILY. (Patient not taking: Reported on 11/06/2021) 90 tablet 3   SYNTHROID 112 MCG tablet 1 tablet in the morning on an empty stomach Orally Once a day 90 days 90 tablet 4   SYNTHROID 125 MCG tablet Take 1 tablet (125 mcg total) by mouth daily in the morning on an empty stomach. 30 tablet 11   Turmeric (QC TUMERIC COMPLEX PO) Take 1 capsule by mouth daily.     Vitamin D, Cholecalciferol, 50 MCG (2000 UT) CAPS Take 2,000 Units by mouth daily.     No current facility-administered medications for this visit.    Allergies  Allergen Reactions   Cephalosporins Anaphylaxis and Other (See Comments)    Caused hypotension and a fever   Eggs Or Egg-Derived Products Hives, Swelling and Other (See Comments)    Angioedema   Penicillins Anaphylaxis    Has patient had a PCN reaction causing immediate rash, facial/tongue/throat swelling, SOB or lightheadedness with hypotension: Yes Has patient had a PCN reaction causing severe rash involving mucus membranes or skin necrosis: No Has patient had a PCN reaction that required hospitalization: Observed X12 hrs Has patient had a PCN reaction occurring within the last 10 years: No If all of the above answers are "NO", then may proceed with Cephalosporin use.    Sulfa Drugs Cross Reactors Other (See Comments)    Liver failure   Adhesive [Tape] Hives and Itching    Cannot wear for any length of time   Effexor [Venlafaxine] Other (See Comments)    Lightheadedness, dizziness, and headaches resulted    Diagnoses:  Generalized anxiety disorder   Individualized Treatment Plan                Strengths: bright, verbal, motivated, self aware, self advocate   Supports: spouse, family, friends, colleagues   Goal/Needs for Treatment:  In order of importance to patient  1) Develop healthy interpersonal relationships that lead to the alleviation and help prevent the relapse of  depression.  2) Learn and implement coping skills that result in a reduction of anxiety and worry, and improved functioning.   3) Combine skills learned in therapy into a new daily approach to managing ADHD.   Client Statement of Needs: requests assistance  with piecing back together her life as it once was before she got depressed (burnt out).   Treatment Level: Weekly Individual Outpatient Psychotherapy.  Symptoms: autonomic hyperactivity (e.g., palpitations, shortness of breath, dry mouth, trouble swallowing, nausea, diarrhea).  Childhood history of Attention Deficit Disorder (ADD) that was either diagnosed or later concluded due to the symptoms of behavioral problems at school, impulsivity, temper outbursts, and lack of concentration.  Depressed or irritable mood. Diminished interest in or enjoyment of activities. Easily distracted and drawn from task at hand.  Excessive and/or unrealistic worry that is difficult to control occurring more days than not for at least 6 months about a number of events or activities.  Feelings of hopelessness, worthlessness, or inappropriate guilt.  Lack of energy.   Poor concentration and indecisiveness.  Restless and fidgety; unable to be sedentary for more than a short time.   Client Treatment Preferences: Continue with current therapist   Healthcare consumer's goal for treatment:  Psychologist, [name], Ph.D. will support the patient's ability to achieve the goals identified. Cognitive Behavioral Therapy, Dialectical Behavioral Therapy, Motivational Interviewing, SPACE, parent training, and other evidenced-based practices will be used to promote progress towards healthy functioning.   Healthcare consumer Julene "[name]" Kue will: Actively participate in therapy, working towards healthy functioning.    *Justification for Continuation/Discontinuation of Goal: R=Revised, O=Ongoing, A=Achieved, D=Discontinued  Goal 1) Develop healthy  interpersonal relationships that lead to the alleviation and help prevent the relapse of depression.  5 Point Likert rating baseline date: 05/28/2021 Target Date Goal Was reviewed Status Code Progress towards goal/Likert rating  05/29/2023 05/28/2022         O 3/5 - learning to set better limits and boundaries, still hasn't been able to regulate her daily routines             Goal 2) Learn and implement coping skills that result in a reduction of anxiety and worry, and improved daily functioning.  5 Point Likert rating baseline date: 05/28/2021  Target Date Goal Was reviewed Status Code Progress towards goal/Likert rating  05/29/2023 05/28/2022          O 2/5 - learning to use her skills more consistently             Goal 3) Combine skills learned in therapy into a new daily approach to managing ADHD.  5 Point Likert rating baseline date: 05/28/2021  Target Date Goal Was reviewed Status Code Progress towards goal/Likert rating  05/29/2023 05/28/2022          O 2/5 - learning to use her skills more consistently             This plan has been reviewed and created by the following participants:  This plan will be reviewed at least every 12 months. Date Behavioral Health Clinician Date Guardian/Patient   05/28/2022 [name], Ph.D.   05/28/2022 [name]" Corynn Solberg reports that both her son and husband got COVID.  She shared her concerns and anxieties particularly about her husband who has PA.  [name] states that she met with her director and was able to have an honest conversation about her need to curb her night time hours.  We d/e/p how the meeting went, addressed her anxieties and practiced challenging her anxious thoughts.  Lastly, [name] talked about the changes she made this week based on what was d/ in  our last session.  She was very successful in approaching and completing tasks.   [name], PhD

## 2022-07-29 ENCOUNTER — Encounter: Payer: Self-pay | Admitting: *Deleted

## 2022-07-30 ENCOUNTER — Ambulatory Visit (INDEPENDENT_AMBULATORY_CARE_PROVIDER_SITE_OTHER): Payer: No Typology Code available for payment source | Admitting: Psychology

## 2022-07-30 DIAGNOSIS — F411 Generalized anxiety disorder: Secondary | ICD-10-CM

## 2022-07-30 NOTE — Progress Notes (Signed)
PROGRESS NOTE:   Name: Olivia [name] Date: 07/30/2022 MRN: [id number] DOB: 04/27/1966 PCP: Leamon Arnt, MD  Time spent: 4:00 - 4:55 PM  Annual Review: 05/29/2023  Today I met with  Olivia [name] in remote video (WebEx) face-to-face individual psychotherapy.  Distance Site: [name]: Olivia [name] Remote Office Consent: Obtained verbal consent to transmit  session remotely    Reason for Visit /Presenting Problem:  Following the discovery of a parathyroid tumor her life has changed dramatically. She is normally a high functioning and driven individual and it is a struggle to get motivated to do anything. If it is necessary for work she does it and does a good job, but it is not the same. She has gained 70 pounds in less than 9 months and it has impacted her self image. She is also peri menopausal and these hormonal changes are also likely to be contributing to her changing mood [name].   A year and a half ago her father had quadrupel by-pass surgery and he was recently diagnosed with cancer. 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  Olivia [name] that she was diagnosed with adult ADHD and responded well to medications.  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)   Son: Olivia [name] (35) he is on the ASD, he attends [employer]   Mental Status Exam: Appearance:   Casual     Behavior:  Appropriate  Motor:  Normal  Speech/Language:   NA  Affect:  Appropriate  Mood:  normal  Thought process:  normal  Thought content:    WNL  Sensory/Perceptual disturbances:    WNL  Orientation:  oriented to person, place, time/date, situation, and day of week  Attention:  Good  Concentration:  Good  Memory:  WNL  Fund of knowledge:   Good  Insight:    Good  Judgment:   Good  Impulse Control:  Good    Diagnoses:  Generalized anxiety disorder   Individualized Treatment  Plan                Strengths: bright, verbal, motivated, self aware, self advocate   Supports: spouse, family, friends, colleagues   Goal/Needs for Treatment:  In order of importance to patient  1) Develop healthy interpersonal relationships that lead to the alleviation and help prevent the relapse of depression.  2) Learn and implement coping skills that result in a reduction of anxiety and worry, and improved functioning.   3) Combine skills learned in therapy into a new daily approach to managing ADHD.   Client Statement of Needs: requests assistance with piecing back together her life as it once was before she got depressed (burnt out).   Treatment Level: Weekly Individual Outpatient Psychotherapy.  Symptoms: autonomic hyperactivity (e.g., palpitations, shortness of breath, dry mouth, trouble swallowing, nausea, diarrhea).  Childhood history of Attention Deficit Disorder (ADD) that was either diagnosed or later concluded due to the symptoms of behavioral problems at school, impulsivity, temper outbursts, and lack of concentration.  Depressed or irritable mood. Diminished interest in or enjoyment of activities. Easily distracted and drawn from task at hand.  Excessive and/or unrealistic worry that is difficult to control occurring more days than not for at least 6 months about a number of events or activities.  Feelings of hopelessness, worthlessness, or inappropriate guilt.  Lack of energy.   Poor concentration and indecisiveness.  Restless and  fidgety; unable to be sedentary for more than a short time.   Client Treatment Preferences: Continue with current therapist   Healthcare consumer's goal for treatment:  Psychologist, Olivia [name], Ph.D. will support the patient's ability to achieve the goals identified. Cognitive Behavioral Therapy, Dialectical Behavioral Therapy, Motivational Interviewing, SPACE, parent training, and other evidenced-based practices will be used to  promote progress towards healthy functioning.   Healthcare consumer Olivia [name] will: Actively participate in therapy, working towards healthy functioning.    *Justification for Continuation/Discontinuation of Goal: R=Revised, O=Ongoing, A=Achieved, D=Discontinued  Goal 1) Develop healthy interpersonal relationships that lead to the alleviation and help prevent the relapse of depression.  5 Point Likert rating baseline date: 05/28/2021 Target Date Goal Was reviewed Status Code Progress towards goal/Likert rating  05/29/2023 05/28/2022         O 3/5 - learning to set better limits and boundaries, still hasn't been able to regulate her daily routines             Goal 2) Learn and implement coping skills that result in a reduction of anxiety and worry, and improved daily functioning.  5 Point Likert rating baseline date: 05/28/2021  Target Date Goal Was reviewed Status Code Progress towards goal/Likert rating  05/29/2023 05/28/2022          O 2/5 - learning to use her skills more consistently             Goal 3) Combine skills learned in therapy into a new daily approach to managing ADHD.  5 Point Likert rating baseline date: 05/28/2021  Target Date Goal Was reviewed Status Code Progress towards goal/Likert rating  05/29/2023 05/28/2022          O 2/5 - learning to use her skills more consistently             This plan has been reviewed and created by the following participants:  This plan will be reviewed at least every 12 months. Date Behavioral Health Clinician Date Guardian/Patient   05/28/2022 Olivia [name], Ph.D.   05/28/2022 [name]" Barri Neidlinger reports that it's been a stressful week.  Her son and husband are still struggling with COVID and her constant care-taking tasks have been hard on her.  Olivia [name]'s father went in to talk to the oncologist and she has to miss the appointment because of her exposure.  She was feeling guilty and we were able  to p/ and challenge her negative thoughts.    On a positive note, Olivia [name] [name] that for the first time since she fell into a depression, she is looking forward to decorating for the holiday and feels  like she will have the energy to do it.  She is encouraged to take time off while her father goes through treatment and to allow herself some time to think about what's next.   Olivia Crochet, PhD

## 2022-08-06 ENCOUNTER — Ambulatory Visit (INDEPENDENT_AMBULATORY_CARE_PROVIDER_SITE_OTHER): Payer: No Typology Code available for payment source | Admitting: Psychology

## 2022-08-06 DIAGNOSIS — F324 Major depressive disorder, single episode, in partial remission: Secondary | ICD-10-CM | POA: Diagnosis not present

## 2022-08-06 NOTE — Progress Notes (Signed)
PROGRESS NOTE:   Name: Olivia [name] Date: 08/06/2022 MRN: [id number] DOB: [birth year] PCP: Leamon Arnt, MD  Time spent: 4:00 - 4:55 PM  Annual Review: 05/29/2023  Today I met with  [name] in remote video (WebEx) face-to-face individual psychotherapy.  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 She is normally a high functioning and driven individual and it is a struggle to get motivated to do anything. If it is necessary for work she does it and does a good job, but it is not the same. She has gained 70 pounds in less than 9 months and it has impacted her self image. She is also peri menopausal and these hormonal changes are also likely to be contributing to her changing mood states.   A year and a half ago her father had quadrupel by-pass surgery and he was recently diagnosed with cancer. 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  Deone states that she was diagnosed with adult ADHD and responded well to medications.  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)   Son: [name] (38) he is on the ASD, he attends [employer]   Mental Status Exam: Appearance:   Casual     Behavior:  Appropriate  Motor:  Normal  Speech/Language:   NA  Affect:  Appropriate  Mood:  normal  Thought process:  normal  Thought content:    WNL  Sensory/Perceptual disturbances:    WNL  Orientation:  oriented to person, place, time/date, situation, and day of week  Attention:  Good  Concentration:  Good  Memory:  WNL  Fund of knowledge:   Good  Insight:    Good  Judgment:   Good  Impulse Control:  Good    Diagnoses:  Major depression single episode, in partial remission  ([place])   Individualized Treatment Plan                Strengths: bright, verbal, motivated, self aware, self advocate   Supports: spouse, family, friends, colleagues   Goal/Needs for Treatment:  In order of importance to patient  1) Develop healthy interpersonal relationships that lead to the alleviation and help prevent the relapse of depression.  2) Learn and implement coping skills that result in a reduction of anxiety and worry, and improved functioning.   3) Combine skills learned in therapy into a new daily approach to managing ADHD.   Client Statement of Needs: requests assistance with piecing back together her life as it once was before she got depressed (burnt out).   Treatment Level: Weekly Individual Outpatient Psychotherapy.  Symptoms: autonomic hyperactivity (e.g., palpitations, shortness of breath, dry mouth, trouble swallowing, nausea, diarrhea).  Childhood history of Attention Deficit Disorder (ADD) that was either diagnosed or later concluded due to the symptoms of behavioral problems at school, impulsivity, temper outbursts, and lack of concentration.  Depressed or irritable mood. Diminished interest in or enjoyment of activities. Easily distracted and drawn from task at hand.  Excessive and/or unrealistic worry that is difficult to control occurring more days than not for at least 6 months about a number of events or activities.  Feelings of hopelessness, worthlessness, or inappropriate guilt.  Lack of energy.   Poor concentration  and indecisiveness.  Restless and fidgety; unable to be sedentary for more than a short time.   Client Treatment Preferences: Continue with current therapist   Healthcare consumer's goal for treatment:  Psychologist, [name], Ph.D. will support the patient's ability to achieve the goals identified. Cognitive Behavioral Therapy, Dialectical Behavioral Therapy, Motivational Interviewing, SPACE, parent training, and other  evidenced-based practices will be used to promote progress towards healthy functioning.   Healthcare consumer Catelin "[name]" Wiland will: Actively participate in therapy, working towards healthy functioning.    *Justification for Continuation/Discontinuation of Goal: R=Revised, O=Ongoing, A=Achieved, D=Discontinued  Goal 1) Develop healthy interpersonal relationships that lead to the alleviation and help prevent the relapse of depression.  5 Point Likert rating baseline date: 05/28/2021 Target Date Goal Was reviewed Status Code Progress towards goal/Likert rating  05/29/2023 05/28/2022         O 3/5 - learning to set better limits and boundaries, still hasn't been able to regulate her daily routines             Goal 2) Learn and implement coping skills that result in a reduction of anxiety and worry, and improved daily functioning.  5 Point Likert rating baseline date: 05/28/2021  Target Date Goal Was reviewed Status Code Progress towards goal/Likert rating  05/29/2023 05/28/2022          O 2/5 - learning to use her skills more consistently             Goal 3) Combine skills learned in therapy into a new daily approach to managing ADHD.  5 Point Likert rating baseline date: 05/28/2021  Target Date Goal Was reviewed Status Code Progress towards goal/Likert rating  05/29/2023 05/28/2022          O 2/5 - learning to use her skills more consistently             This plan has been reviewed and created by the following participants:  This plan will be reviewed at least every 12 months. Date Behavioral Health Clinician Date Guardian/Patient   05/28/2022 [name], Ph.D.   05/28/2022 [name]" Salima Rumer reports that her niece had a crisis this weekend and she got pulled into the middle.  She was upset that the family is leaning hard on her to "solve" their problems.  We d/p the ways in which she enables their use of her in this role, how to extract herself and  set limits.  [name] states that as her sleep improves, she is better rested, her mood has improved and her interest in things has also Improved.  The one area that she still struggles with is her daughter's estrangement and her feelings of guilt.  I provided additional psycho eduction about what it means to have BPD and how this relates to her daughter's behavior.  We d/e/p Cathy's feelings, thoughts and need to enjoy what she has in her immediate household.   [name], PhD

## 2022-08-13 ENCOUNTER — Ambulatory Visit (INDEPENDENT_AMBULATORY_CARE_PROVIDER_SITE_OTHER): Payer: No Typology Code available for payment source | Admitting: Psychology

## 2022-08-13 DIAGNOSIS — F324 Major depressive disorder, single episode, in partial remission: Secondary | ICD-10-CM

## 2022-08-13 NOTE — Progress Notes (Signed)
PROGRESS NOTE:   Name: Olivia [name] Date: 08/13/2022 MRN: [id number] DOB: November 01, 1966 PCP: Leamon Arnt, MD  Time spent: 4:00 - 4:55 PM  Annual Review: 05/29/2023  Today I met with  Olivia [name] in remote video (WebEx) face-to-face individual psychotherapy.  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 She is normally a high functioning and driven individual and it is a struggle to get motivated to do anything. If it is necessary for work she does it and does a good job, but it is not the same. She has gained 70 pounds in less than 9 months and it has impacted her self image. She is also peri menopausal and these hormonal changes are also likely to be contributing to her changing mood states.   A year and a half ago her father had quadrupel by-pass surgery and he was recently diagnosed with cancer. 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  Olivia [name] states that she was diagnosed with adult ADHD and responded well to medications.  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   Individualized Treatment  Plan                Strengths: bright, verbal, motivated, self aware, self advocate   Supports: spouse, family, friends, colleagues   Goal/Needs for Treatment:  In order of importance to patient  1) Develop healthy interpersonal relationships that lead to the alleviation and help prevent the relapse of depression.  2) Learn and implement coping skills that result in a reduction of anxiety and worry, and improved functioning.   3) Combine skills learned in therapy into a new daily approach to managing ADHD.   Client Statement of Needs: requests assistance with piecing back together her life as it once was before she got depressed (burnt out).   Treatment Level: Weekly Individual Outpatient Psychotherapy.  Symptoms: autonomic hyperactivity (e.g., palpitations, shortness of breath, dry mouth, trouble swallowing, nausea, diarrhea).  Childhood history of Attention Deficit Disorder (ADD) that was either diagnosed or later concluded due to the symptoms of behavioral problems at school, impulsivity, temper outbursts, and lack of concentration.  Depressed or irritable mood. Diminished interest in or enjoyment of activities. Easily distracted and drawn from task at hand.  Excessive and/or unrealistic worry that is difficult to control occurring more days than not for at least 6 months about a number of events or activities.  Feelings of hopelessness, worthlessness, or inappropriate guilt.  Lack of energy.   Poor concentration and indecisiveness.  Restless and  fidgety; unable to be sedentary for more than a short time.   Client Treatment Preferences: Continue with current therapist   Healthcare consumer's goal for treatment:  Psychologist, [name], Ph.D. will support the patient's ability to achieve the goals identified. Cognitive Behavioral Therapy, Dialectical Behavioral Therapy, Motivational Interviewing, SPACE, parent training, and other evidenced-based practices will be used to  promote progress towards healthy functioning.   Healthcare consumer Olivia [name] will: Actively participate in therapy, working towards healthy functioning.    *Justification for Continuation/Discontinuation of Goal: R=Revised, O=Ongoing, A=Achieved, D=Discontinued  Goal 1) Develop healthy interpersonal relationships that lead to the alleviation and help prevent the relapse of depression.  5 Point Likert rating baseline date: 05/28/2021 Target Date Goal Was reviewed Status Code Progress towards goal/Likert rating  05/29/2023 05/28/2022         O 3/5 - learning to set better limits and boundaries, still hasn't been able to regulate her daily routines             Goal 2) Learn and implement coping skills that result in a reduction of anxiety and worry, and improved daily functioning.  5 Point Likert rating baseline date: 05/28/2021  Target Date Goal Was reviewed Status Code Progress towards goal/Likert rating  05/29/2023 05/28/2022          O 2/5 - learning to use her skills more consistently             Goal 3) Combine skills learned in therapy into a new daily approach to managing ADHD.  5 Point Likert rating baseline date: 05/28/2021  Target Date Goal Was reviewed Status Code Progress towards goal/Likert rating  05/29/2023 05/28/2022          O 2/5 - learning to use her skills more consistently             This plan has been reviewed and created by the following participants:  This plan will be reviewed at least every 12 months. Date Behavioral Health Clinician Date Guardian/Patient   05/28/2022 [name], Ph.D.   05/28/2022 Olivia [name] reports that she has been success this week at setting limits at work and in her committee work. Olivia [name] still has moments of stress and anxiety when she sets limits, but she's doing it.  She shared that she is also doing a great job of keeping healthy boundaries with her niece and sister.  Lastly, we d/p  how she is progressing in letting go of her guilt related to her daughter and moving towards peacefully accepting where things are at.   [name], PhD

## 2022-08-18 LAB — HM PAP SMEAR: HM Pap smear: NORMAL

## 2022-08-19 ENCOUNTER — Ambulatory Visit (INDEPENDENT_AMBULATORY_CARE_PROVIDER_SITE_OTHER): Payer: No Typology Code available for payment source | Admitting: Psychology

## 2022-08-19 ENCOUNTER — Other Ambulatory Visit (HOSPITAL_BASED_OUTPATIENT_CLINIC_OR_DEPARTMENT_OTHER): Payer: Self-pay

## 2022-08-19 DIAGNOSIS — F324 Major depressive disorder, single episode, in partial remission: Secondary | ICD-10-CM

## 2022-08-19 NOTE — Progress Notes (Signed)
PROGRESS NOTE:   Name: MIKENNA BUNKLEY Date: 08/19/2022 MRN: [id number] DOB: Apr 30, 1966 PCP: Leamon Arnt, MD  Time spent: 4:00 - 4:55 PM  Annual Review: 05/29/2023  Today I met with  [name] in remote video (WebEx) face-to-face individual psychotherapy.  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 She is normally a high functioning and driven individual and it is a struggle to get motivated to do anything. If it is necessary for work she does it and does a good job, but it is not the same. She has gained 70 pounds in less than 9 months and it has impacted her self image. She is also peri menopausal and these hormonal changes are also likely to be contributing to her changing mood states.   A year and a half ago her father had quadrupel by-pass surgery and he was recently diagnosed with cancer. 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  Vennesa states that she was diagnosed with adult ADHD and responded well to medications.  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)   Son: [name] (62) he is on the ASD, he attends [employer]   Mental Status Exam: Appearance:   Casual     Behavior:  Appropriate  Motor:  Normal  Speech/Language:   NA  Affect:  Appropriate  Mood:  normal  Thought process:  normal  Thought content:    WNL  Sensory/Perceptual disturbances:    WNL  Orientation:  oriented to person, place, time/date, situation, and day of week  Attention:  Good  Concentration:  Good  Memory:  WNL  Fund of knowledge:   Good  Insight:    Good  Judgment:   Good  Impulse Control:  Good    Diagnoses:  Major depression single episode, in partial remission  ([place])   Individualized Treatment Plan                Strengths: bright, verbal, motivated, self aware, self advocate   Supports: spouse, family, friends, colleagues   Goal/Needs for Treatment:  In order of importance to patient  1) Develop healthy interpersonal relationships that lead to the alleviation and help prevent the relapse of depression.  2) Learn and implement coping skills that result in a reduction of anxiety and worry, and improved functioning.   3) Combine skills learned in therapy into a new daily approach to managing ADHD.   Client Statement of Needs: requests assistance with piecing back together her life as it once was before she got depressed (burnt out).   Treatment Level: Weekly Individual Outpatient Psychotherapy.  Symptoms: autonomic hyperactivity (e.g., palpitations, shortness of breath, dry mouth, trouble swallowing, nausea, diarrhea).  Childhood history of Attention Deficit Disorder (ADD) that was either diagnosed or later concluded due to the symptoms of behavioral problems at school, impulsivity, temper outbursts, and lack of concentration.  Depressed or irritable mood. Diminished interest in or enjoyment of activities. Easily distracted and drawn from task at hand.  Excessive and/or unrealistic worry that is difficult to control occurring more days than not for at least 6 months about a number of events or activities.  Feelings of hopelessness, worthlessness, or inappropriate guilt.  Lack of energy.   Poor  concentration and indecisiveness.  Restless and fidgety; unable to be sedentary for more than a short time.   Client Treatment Preferences: Continue with current therapist   Healthcare consumer's goal for treatment:  Psychologist, [name], Ph.D. will support the patient's ability to achieve the goals identified. Cognitive Behavioral Therapy, Dialectical Behavioral Therapy, Motivational Interviewing, SPACE, parent training, and other  evidenced-based practices will be used to promote progress towards healthy functioning.   Healthcare consumer Sincerity "[name]" Beel will: Actively participate in therapy, working towards healthy functioning.    *Justification for Continuation/Discontinuation of Goal: R=Revised, O=Ongoing, A=Achieved, D=Discontinued  Goal 1) Develop healthy interpersonal relationships that lead to the alleviation and help prevent the relapse of depression.  5 Point Likert rating baseline date: 05/28/2021 Target Date Goal Was reviewed Status Code Progress towards goal/Likert rating  05/29/2023 05/28/2022         O 3/5 - learning to set better limits and boundaries, still hasn't been able to regulate her daily routines             Goal 2) Learn and implement coping skills that result in a reduction of anxiety and worry, and improved daily functioning.  5 Point Likert rating baseline date: 05/28/2021  Target Date Goal Was reviewed Status Code Progress towards goal/Likert rating  05/29/2023 05/28/2022          O 2/5 - learning to use her skills more consistently             Goal 3) Combine skills learned in therapy into a new daily approach to managing ADHD.  5 Point Likert rating baseline date: 05/28/2021  Target Date Goal Was reviewed Status Code Progress towards goal/Likert rating  05/29/2023 05/28/2022          O 2/5 - learning to use her skills more consistently             This plan has been reviewed and created by the following participants:  This plan will be reviewed at least every 12 months. Date Behavioral Health Clinician Date Guardian/Patient   05/28/2022 [name], Ph.D.   05/28/2022 [name]" Elizebeth Kluesner reports that she continues to get at least 7 hours of sleep most days and she can't believe what a difference this makes.  We d/e/p one area in which she still struggles.  Cathy and I d/ the past habits she continues to struggle with, made connections between  the present and the past.  We then d/ ways for her to adjust her expectations and manage her negative/judgment thinking.       [name], PhD

## 2022-08-20 ENCOUNTER — Ambulatory Visit: Payer: No Typology Code available for payment source | Admitting: Psychology

## 2022-08-26 ENCOUNTER — Ambulatory Visit (INDEPENDENT_AMBULATORY_CARE_PROVIDER_SITE_OTHER): Payer: No Typology Code available for payment source | Admitting: Psychology

## 2022-08-26 DIAGNOSIS — F324 Major depressive disorder, single episode, in partial remission: Secondary | ICD-10-CM

## 2022-08-26 NOTE — Progress Notes (Signed)
PROGRESS NOTE:   Name: HETTY LINHART Date: 08/26/2022 MRN: [id number] DOB: [birth year] PCP: Leamon Arnt, MD  Time spent: 4:00 - 4:55 PM  Annual Review: 05/29/2023  Today I met with  [name] in remote video (WebEx) face-to-face individual psychotherapy.  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 She is normally a high functioning and driven individual and it is a struggle to get motivated to do anything. If it is necessary for work she does it and does a good job, but it is not the same. She has gained 70 pounds in less than 9 months and it has impacted her self image. She is also peri menopausal and these hormonal changes are also likely to be contributing to her changing mood states.   A year and a half ago her father had quadrupel by-pass surgery and he was recently diagnosed with cancer. 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  Mahsa states that she was diagnosed with adult ADHD and responded well to medications.  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   Individualized Treatment  Plan                Strengths: bright, verbal, motivated, self aware, self advocate   Supports: spouse, family, friends, colleagues   Goal/Needs for Treatment:  In order of importance to patient  1) Develop healthy interpersonal relationships that lead to the alleviation and help prevent the relapse of depression.  2) Learn and implement coping skills that result in a reduction of anxiety and worry, and improved functioning.   3) Combine skills learned in therapy into a new daily approach to managing ADHD.   Client Statement of Needs: requests assistance with piecing back together her life as it once was before she got depressed (burnt out).   Treatment Level: Weekly Individual Outpatient Psychotherapy.  Symptoms: autonomic hyperactivity (e.g., palpitations, shortness of breath, dry mouth, trouble swallowing, nausea, diarrhea).  Childhood history of Attention Deficit Disorder (ADD) that was either diagnosed or later concluded due to the symptoms of behavioral problems at school, impulsivity, temper outbursts, and lack of concentration.  Depressed or irritable mood. Diminished interest in or enjoyment of activities. Easily distracted and drawn from task at hand.  Excessive and/or unrealistic worry that is difficult to control occurring more days than not for at least 6 months about a number of events or activities.  Feelings of hopelessness, worthlessness, or inappropriate guilt.  Lack of energy.   Poor concentration and indecisiveness.  Restless and  fidgety; unable to be sedentary for more than a short time.   Client Treatment Preferences: Continue with current therapist   Healthcare consumer's goal for treatment:  Psychologist, [name], Ph.D. will support the patient's ability to achieve the goals identified. Cognitive Behavioral Therapy, Dialectical Behavioral Therapy, Motivational Interviewing, SPACE, parent training, and other evidenced-based practices will be used to  promote progress towards healthy functioning.   Healthcare consumer Taralee "[name]" Armwood will: Actively participate in therapy, working towards healthy functioning.    *Justification for Continuation/Discontinuation of Goal: R=Revised, O=Ongoing, A=Achieved, D=Discontinued  Goal 1) Develop healthy interpersonal relationships that lead to the alleviation and help prevent the relapse of depression.  5 Point Likert rating baseline date: 05/28/2021 Target Date Goal Was reviewed Status Code Progress towards goal/Likert rating  05/29/2023 05/28/2022         O 3/5 - learning to set better limits and boundaries, still hasn't been able to regulate her daily routines             Goal 2) Learn and implement coping skills that result in a reduction of anxiety and worry, and improved daily functioning.  5 Point Likert rating baseline date: 05/28/2021  Target Date Goal Was reviewed Status Code Progress towards goal/Likert rating  05/29/2023 05/28/2022          O 2/5 - learning to use her skills more consistently             Goal 3) Combine skills learned in therapy into a new daily approach to managing ADHD.  5 Point Likert rating baseline date: 05/28/2021  Target Date Goal Was reviewed Status Code Progress towards goal/Likert rating  05/29/2023 05/28/2022          O 2/5 - learning to use her skills more consistently             This plan has been reviewed and created by the following participants:  This plan will be reviewed at least every 12 months. Date Behavioral Health Clinician Date Guardian/Patient   05/28/2022 [name], Ph.D.   05/28/2022 [name]" Zlata Alcaide reports that her sleep and mood states are better regulated.  She is restarting a Whole [employer] in order to prep for a new treatment for her psoriasis.  Jamirah f/t and has found a service to help her declutter her home as a means to feel less overwhelmed and get things to the point where she can maintain  them.      [name], PhD

## 2022-08-27 ENCOUNTER — Ambulatory Visit: Payer: No Typology Code available for payment source | Admitting: Psychology

## 2022-09-02 ENCOUNTER — Ambulatory Visit (INDEPENDENT_AMBULATORY_CARE_PROVIDER_SITE_OTHER): Payer: No Typology Code available for payment source | Admitting: Psychology

## 2022-09-02 ENCOUNTER — Other Ambulatory Visit (HOSPITAL_BASED_OUTPATIENT_CLINIC_OR_DEPARTMENT_OTHER): Payer: Self-pay

## 2022-09-02 DIAGNOSIS — F324 Major depressive disorder, single episode, in partial remission: Secondary | ICD-10-CM

## 2022-09-02 MED ORDER — INFLUENZA VAC SPLIT QUAD 0.5 ML IM SUSY
PREFILLED_SYRINGE | INTRAMUSCULAR | 0 refills | Status: DC
  Filled 2022-09-02: qty 0.5, 1d supply, fill #0

## 2022-09-02 NOTE — Progress Notes (Signed)
PROGRESS NOTE:   Name: Olivia [name] Date: 09/02/2022 MRN: [id number] DOB: 1966-01-18 PCP: Leamon Arnt, MD  Time spent: 4:00 - 4:55 PM  Annual Review: 05/29/2023  Today I met with  Olivia [name] in remote video (WebEx) face-to-face individual psychotherapy.  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 She is normally a high functioning and driven individual and it is a struggle to get motivated to do anything. If it is necessary for work she does it and does a good job, but it is not the same. She has gained 70 pounds in less than 9 months and it has impacted her self image. She is also peri menopausal and these hormonal changes are also likely to be contributing to her changing mood states.   A year and a half ago her father had quadrupel by-pass surgery and he was recently diagnosed with cancer. 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  Makhya states that she was diagnosed with adult ADHD and responded well to medications.  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)   Son: Olivia [name] (34) he is on the ASD, he attends [employer]   Mental Status Exam: Appearance:   Casual     Behavior:  Appropriate  Motor:  Normal  Speech/Language:   NA  Affect:  Appropriate  Mood:  normal  Thought process:  normal  Thought content:    WNL  Sensory/Perceptual disturbances:    WNL  Orientation:  oriented to person, place, time/date, situation, and day of week  Attention:  Good  Concentration:  Good  Memory:  WNL  Fund of knowledge:   Good  Insight:    Good  Judgment:   Good  Impulse Control:  Good    Diagnoses:  Major depression single episode, in partial remission  ([place])   Individualized Treatment Plan                Strengths: bright, verbal, motivated, self aware, self advocate   Supports: spouse, family, friends, colleagues   Goal/Needs for Treatment:  In order of importance to patient  1) Develop healthy interpersonal relationships that lead to the alleviation and help prevent the relapse of depression.  2) Learn and implement coping skills that result in a reduction of anxiety and worry, and improved functioning.   3) Combine skills learned in therapy into a new daily approach to managing ADHD.   Client Statement of Needs: requests assistance with piecing back together her life as it once was before she got depressed (burnt out).   Treatment Level: Weekly Individual Outpatient Psychotherapy.  Symptoms: autonomic hyperactivity (e.g., palpitations, shortness of breath, dry mouth, trouble swallowing, nausea, diarrhea).  Childhood history of Attention Deficit Disorder (ADD) that was either diagnosed or later concluded due to the symptoms of behavioral problems at school, impulsivity, temper outbursts, and lack of concentration.  Depressed or irritable mood. Diminished interest in or enjoyment of activities. Easily distracted and drawn from task at hand.  Excessive and/or unrealistic worry that is difficult to control occurring more days than not for at least 6 months about a number of events or activities.  Feelings of hopelessness, worthlessness, or inappropriate guilt.  Lack of energy.   Poor concentration  and indecisiveness.  Restless and fidgety; unable to be sedentary for more than a short time.   Client Treatment Preferences: Continue with current therapist   Healthcare consumer's goal for treatment:  Psychologist, [name], Ph.D. will support the patient's ability to achieve the goals identified. Cognitive Behavioral Therapy, Dialectical Behavioral Therapy, Motivational Interviewing, SPACE, parent training, and other  evidenced-based practices will be used to promote progress towards healthy functioning.   Healthcare consumer Ileene "[name]" Fike will: Actively participate in therapy, working towards healthy functioning.    *Justification for Continuation/Discontinuation of Goal: R=Revised, O=Ongoing, A=Achieved, D=Discontinued  Goal 1) Develop healthy interpersonal relationships that lead to the alleviation and help prevent the relapse of depression.  5 Point Likert rating baseline date: 05/28/2021 Target Date Goal Was reviewed Status Code Progress towards goal/Likert rating  05/29/2023 05/28/2022         O 3/5 - learning to set better limits and boundaries, still hasn't been able to regulate her daily routines             Goal 2) Learn and implement coping skills that result in a reduction of anxiety and worry, and improved daily functioning.  5 Point Likert rating baseline date: 05/28/2021  Target Date Goal Was reviewed Status Code Progress towards goal/Likert rating  05/29/2023 05/28/2022          O 2/5 - learning to use her skills more consistently             Goal 3) Combine skills learned in therapy into a new daily approach to managing ADHD.  5 Point Likert rating baseline date: 05/28/2021  Target Date Goal Was reviewed Status Code Progress towards goal/Likert rating  05/29/2023 05/28/2022          O 2/5 - learning to use her skills more consistently             This plan has been reviewed and created by the following participants:  This plan will be reviewed at least every 12 months. Date Behavioral Health Clinician Date Guardian/Patient   05/28/2022 [name], Ph.D.   05/28/2022 Olivia [name]" Olivia [name] reports that she has begun to declutter but she was finding a hard time letting some thing go.  We d/e/p what occurred and what the item represented for her.  We p/ and made connections between the past and the present.  I provided some psycho education about  the ways and reasons individuals with ADHD can struggle to get rid of things, stay organize and are at greater risk for hoarding.  I then provided her with some strategies for approaching these tasks.      [name], PhD

## 2022-09-03 ENCOUNTER — Ambulatory Visit: Payer: No Typology Code available for payment source | Admitting: Psychology

## 2022-09-09 ENCOUNTER — Ambulatory Visit (INDEPENDENT_AMBULATORY_CARE_PROVIDER_SITE_OTHER): Payer: No Typology Code available for payment source | Admitting: Psychology

## 2022-09-09 DIAGNOSIS — F324 Major depressive disorder, single episode, in partial remission: Secondary | ICD-10-CM | POA: Diagnosis not present

## 2022-09-09 DIAGNOSIS — F900 Attention-deficit hyperactivity disorder, predominantly inattentive type: Secondary | ICD-10-CM

## 2022-09-09 NOTE — Progress Notes (Signed)
PROGRESS NOTE:   Name: Olivia [name] Date: 09/09/2022 MRN: [id number] DOB: June 02, 1966 PCP: Leamon Arnt, MD  Time spent: 4:00 - 4:55 PM  Annual Review: 05/29/2023  Today I met with  Olivia [name] in remote video (WebEx) face-to-face individual psychotherapy.  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 She is normally a high functioning and driven individual and it is a struggle to get motivated to do anything. If it is necessary for work she does it and does a good job, but it is not the same. She has gained 70 pounds in less than 9 months and it has impacted her self image. She is also peri menopausal and these hormonal changes are also likely to be contributing to her changing mood states.   A year and a half ago her father had quadrupel by-pass surgery and he was recently diagnosed with cancer. 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  Olivia [name] states that she was diagnosed with adult ADHD and responded well to medications.  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)   Son: Olivia [name] (57) he is on the ASD, he attends [employer]   Mental Status Exam: Appearance:   Casual     Behavior:  Appropriate  Motor:  Normal  Speech/Language:   NA  Affect:  Appropriate  Mood:  normal  Thought process:  normal  Thought content:    WNL  Sensory/Perceptual disturbances:    WNL  Orientation:  oriented to person, place, time/date, situation, and day of week  Attention:  Good  Concentration:  Good  Memory:  WNL  Fund of knowledge:   Good  Insight:    Good  Judgment:   Good  Impulse Control:  Good    Diagnoses:  Major depression single episode, in partial remission  ([place])  Attention deficit hyperactivity disorder (ADHD), predominantly inattentive type   Individualized Treatment Plan                Strengths: bright, verbal, motivated, self aware, self advocate   Supports: spouse, family, friends, colleagues   Goal/Needs for Treatment:  In order of importance to patient  1) Develop healthy interpersonal relationships that lead to the alleviation and help prevent the relapse of depression.  2) Learn and implement coping skills that result in a reduction of anxiety and worry, and improved functioning.   3) Combine skills learned in therapy into a new daily approach to managing ADHD.   Client Statement of Needs: requests assistance with piecing back together her life as it once was before she got depressed (burnt out).   Treatment Level: Weekly Individual Outpatient Psychotherapy.  Symptoms: autonomic hyperactivity (e.g., palpitations, shortness of breath, dry mouth, trouble swallowing, nausea, diarrhea).  Childhood history of Attention Deficit Disorder (ADD) that was either diagnosed or later concluded due to the symptoms of behavioral problems at school, impulsivity, temper outbursts, and lack of concentration.  Depressed or irritable mood. Diminished interest in or enjoyment of activities. Easily distracted and drawn from task at hand.  Excessive and/or unrealistic worry that is difficult to control occurring more days than not for at least 6 months about a number of events or activities.  Feelings of hopelessness, worthlessness, or inappropriate  guilt.  Lack of energy.   Poor concentration and indecisiveness.  Restless and fidgety; unable to be sedentary for more than a short time.   Client Treatment Preferences: Continue with current therapist   Healthcare consumer's goal for treatment:  Psychologist, [name], Ph.D. will support the patient's ability to achieve the goals identified. Cognitive Behavioral Therapy, Dialectical  Behavioral Therapy, Motivational Interviewing, SPACE, parent training, and other evidenced-based practices will be used to promote progress towards healthy functioning.   Healthcare consumer Olivia [name] will: Actively participate in therapy, working towards healthy functioning.    *Justification for Continuation/Discontinuation of Goal: R=Revised, O=Ongoing, A=Achieved, D=Discontinued  Goal 1) Develop healthy interpersonal relationships that lead to the alleviation and help prevent the relapse of depression.  5 Point Likert rating baseline date: 05/28/2021 Target Date Goal Was reviewed Status Code Progress towards goal/Likert rating  05/29/2023 05/28/2022         O 3/5 - learning to set better limits and boundaries, still hasn't been able to regulate her daily routines             Goal 2) Learn and implement coping skills that result in a reduction of anxiety and worry, and improved daily functioning.  5 Point Likert rating baseline date: 05/28/2021  Target Date Goal Was reviewed Status Code Progress towards goal/Likert rating  05/29/2023 05/28/2022          O 2/5 - learning to use her skills more consistently             Goal 3) Combine skills learned in therapy into a new daily approach to managing ADHD.  5 Point Likert rating baseline date: 05/28/2021  Target Date Goal Was reviewed Status Code Progress towards goal/Likert rating  05/29/2023 05/28/2022          O 2/5 - learning to use her skills more consistently             This plan has been reviewed and created by the following participants:  This plan will be reviewed at least every 12 months. Date Behavioral Health Clinician Date Guardian/Patient   05/28/2022 [name], Ph.D.   05/28/2022 [name] reports that she is keeping up with her sleeping routines and is feeling terrific.  She shared that she spoke to her supervisor about her struggles and was honest about what needed  to change in order to prevent burnout.  We d/p her journaling exercises.  I shared a few insights which she received well and reinforced the changes she is trying to make.    Home Practice: journal of weekly prompts    [name], PhD

## 2022-09-10 ENCOUNTER — Ambulatory Visit: Payer: No Typology Code available for payment source | Admitting: Psychology

## 2022-09-16 ENCOUNTER — Ambulatory Visit (INDEPENDENT_AMBULATORY_CARE_PROVIDER_SITE_OTHER): Payer: No Typology Code available for payment source | Admitting: Psychology

## 2022-09-16 DIAGNOSIS — F324 Major depressive disorder, single episode, in partial remission: Secondary | ICD-10-CM | POA: Diagnosis not present

## 2022-09-16 NOTE — Progress Notes (Signed)
PROGRESS NOTE:   Name: Olivia [name] Date: 09/16/2022 MRN: [id number] DOB: October 25, 1966 PCP: Leamon Arnt, MD  Time spent: 4:00 - 4:55 PM  Annual Review: 05/29/2023  Today I met with  Olivia [name] in remote video (WebEx) face-to-face individual psychotherapy.  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 She is normally a high functioning and driven individual and it is a struggle to get motivated to do anything. If it is necessary for work she does it and does a good job, but it is not the same. She has gained 70 pounds in less than 9 months and it has impacted her self image. She is also peri menopausal and these hormonal changes are also likely to be contributing to her changing mood states.   A year and a half ago her father had quadrupel by-pass surgery and he was recently diagnosed with cancer. 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  Taelyr states that she was diagnosed with adult ADHD and responded well to medications.  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)   Son: Olivia [name] (51) he is on the ASD, he attends [employer]   Mental Status Exam: Appearance:   Casual     Behavior:  Appropriate  Motor:  Normal  Speech/Language:   NA  Affect:  Appropriate  Mood:  normal  Thought process:  normal  Thought content:    WNL  Sensory/Perceptual disturbances:    WNL  Orientation:  oriented to person, place, time/date, situation, and day of week  Attention:  Good  Concentration:  Good  Memory:  WNL  Fund of knowledge:   Good  Insight:    Good  Judgment:   Good  Impulse Control:  Good    Diagnoses:  Major depression single episode, in partial remission  ([place])   Individualized Treatment Plan                Strengths: bright, verbal, motivated, self aware, self advocate   Supports: spouse, family, friends, colleagues   Goal/Needs for Treatment:  In order of importance to patient  1) Develop healthy interpersonal relationships that lead to the alleviation and help prevent the relapse of depression.  2) Learn and implement coping skills that result in a reduction of anxiety and worry, and improved functioning.   3) Combine skills learned in therapy into a new daily approach to managing ADHD.   Client Statement of Needs: requests assistance with piecing back together her life as it once was before she got depressed (burnt out).   Treatment Level: Weekly Individual Outpatient Psychotherapy.  Symptoms: autonomic hyperactivity (e.g., palpitations, shortness of breath, dry mouth, trouble swallowing, nausea, diarrhea).  Childhood history of Attention Deficit Disorder (ADD) that was either diagnosed or later concluded due to the symptoms of behavioral problems at school, impulsivity, temper outbursts, and lack of concentration.  Depressed or irritable mood. Diminished interest in or enjoyment of activities. Easily distracted and drawn from task at hand.  Excessive and/or unrealistic worry that is difficult to control occurring more days than not for at least 6 months about a number of events or activities.  Feelings of hopelessness, worthlessness, or inappropriate guilt.  Lack of energy.   Poor concentration  and indecisiveness.  Restless and fidgety; unable to be sedentary for more than a short time.   Client Treatment Preferences: Continue with current therapist   Healthcare consumer's goal for treatment:  Psychologist, [name], Ph.D. will support the patient's ability to achieve the goals identified. Cognitive Behavioral Therapy, Dialectical Behavioral Therapy, Motivational Interviewing, SPACE, parent training, and other  evidenced-based practices will be used to promote progress towards healthy functioning.   Healthcare consumer [name] "[name]" Ambrosius will: Actively participate in therapy, working towards healthy functioning.    *Justification for Continuation/Discontinuation of Goal: R=Revised, O=Ongoing, A=Achieved, D=Discontinued  Goal 1) Develop healthy interpersonal relationships that lead to the alleviation and help prevent the relapse of depression.  5 Point Likert rating baseline date: 05/28/2021 Target Date Goal Was reviewed Status Code Progress towards goal/Likert rating  05/29/2023 05/28/2022         O 3/5 - learning to set better limits and boundaries, still hasn't been able to regulate her daily routines             Goal 2) Learn and implement coping skills that result in a reduction of anxiety and worry, and improved daily functioning.  5 Point Likert rating baseline date: 05/28/2021  Target Date Goal Was reviewed Status Code Progress towards goal/Likert rating  05/29/2023 05/28/2022          O 2/5 - learning to use her skills more consistently             Goal 3) Combine skills learned in therapy into a new daily approach to managing ADHD.  5 Point Likert rating baseline date: 05/28/2021  Target Date Goal Was reviewed Status Code Progress towards goal/Likert rating  05/29/2023 05/28/2022          O 2/5 - learning to use her skills more consistently             This plan has been reviewed and created by the following participants:  This plan will be reviewed at least every 12 months. Date Behavioral Health Clinician Date Guardian/Patient   05/28/2022 [name], Ph.D.   05/28/2022 [name]" Dominique Ressel reports that this time around she is having a harder time giving up sugar, gluten and dairy.  We d/p how she has been feeling related to her change in diet.  We d/p some issues related to her son, how to provide support and encourage independence.    We d/ the  ways she is working to maintain a social connection with her once estranged friend.  We d/ the importance wisely choosing and prioritizing involvement in state organizations.  I encouraged her to invite a colleague who could serve as a buffer and make it easier to say no if too much was expected of her.   Home Practice: journal of weekly prompts    [name], PhD

## 2022-09-17 ENCOUNTER — Other Ambulatory Visit: Payer: Self-pay | Admitting: Family Medicine

## 2022-09-17 ENCOUNTER — Ambulatory Visit: Payer: No Typology Code available for payment source | Admitting: Psychology

## 2022-09-17 ENCOUNTER — Other Ambulatory Visit (HOSPITAL_BASED_OUTPATIENT_CLINIC_OR_DEPARTMENT_OTHER): Payer: Self-pay

## 2022-09-18 ENCOUNTER — Other Ambulatory Visit (HOSPITAL_COMMUNITY): Payer: Self-pay

## 2022-09-18 MED ORDER — ESCITALOPRAM OXALATE 20 MG PO TABS
20.0000 mg | ORAL_TABLET | Freq: Every day | ORAL | 3 refills | Status: DC
  Filled 2022-09-18 – 2022-09-19 (×2): qty 90, 90d supply, fill #0
  Filled 2022-11-20: qty 90, 90d supply, fill #1
  Filled 2023-03-18: qty 90, 90d supply, fill #2
  Filled 2023-06-18: qty 90, 90d supply, fill #3

## 2022-09-19 ENCOUNTER — Other Ambulatory Visit (HOSPITAL_BASED_OUTPATIENT_CLINIC_OR_DEPARTMENT_OTHER): Payer: Self-pay

## 2022-09-20 ENCOUNTER — Other Ambulatory Visit (HOSPITAL_COMMUNITY): Payer: Self-pay

## 2022-09-23 ENCOUNTER — Ambulatory Visit (INDEPENDENT_AMBULATORY_CARE_PROVIDER_SITE_OTHER): Payer: No Typology Code available for payment source | Admitting: Psychology

## 2022-09-23 DIAGNOSIS — F324 Major depressive disorder, single episode, in partial remission: Secondary | ICD-10-CM

## 2022-09-23 NOTE — Progress Notes (Signed)
PROGRESS NOTE:   Name: Olivia [name] Date: 09/23/2022 MRN: [id number] DOB: [birth year] PCP: Leamon Arnt, MD  Time spent: 4:00 - 4:55 PM  Annual Review: 05/29/2023  Today I met with  Olivia [name] in remote video (WebEx) face-to-face individual psychotherapy.  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 She is normally a high functioning and driven individual and it is a struggle to get motivated to do anything. If it is necessary for work she does it and does a good job, but it is not the same. She has gained 70 pounds in less than 9 months and it has impacted her self image. She is also peri menopausal and these hormonal changes are also likely to be contributing to her changing mood states.   A year and a half ago her father had quadrupel by-pass surgery and he was recently diagnosed with cancer. 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  Olivia [name] states that she was diagnosed with adult ADHD and responded well to medications.  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)   Son: Olivia [name] (68) he is on the ASD, he attends [employer]   Mental Status Exam: Appearance:   Casual     Behavior:  Appropriate  Motor:  Normal  Speech/Language:   NA  Affect:  Appropriate  Mood:  normal  Thought process:  normal  Thought content:    WNL  Sensory/Perceptual disturbances:    WNL  Orientation:  oriented to person, place, time/date, situation, and day of week  Attention:  Good  Concentration:  Good  Memory:  WNL  Fund of knowledge:   Good  Insight:    Good  Judgment:   Good  Impulse Control:  Good    Diagnoses:  Major depression single episode, in partial remission  ([place])   Individualized Treatment Plan                Strengths: bright, verbal, motivated, self aware, self advocate   Supports: spouse, family, friends, colleagues   Goal/Needs for Treatment:  In order of importance to patient  1) Develop healthy interpersonal relationships that lead to the alleviation and help prevent the relapse of depression.  2) Learn and implement coping skills that result in a reduction of anxiety and worry, and improved functioning.   3) Combine skills learned in therapy into a new daily approach to managing ADHD.   Client Statement of Needs: requests assistance with piecing back together her life as it once was before she got depressed (burnt out).   Treatment Level: Weekly Individual Outpatient Psychotherapy.  Symptoms: autonomic hyperactivity (e.g., palpitations, shortness of breath, dry mouth, trouble swallowing, nausea, diarrhea).  Childhood history of Attention Deficit Disorder (ADD) that was either diagnosed or later concluded due to the symptoms of behavioral problems at school, impulsivity, temper outbursts, and lack of concentration.  Depressed or irritable mood. Diminished interest in or enjoyment of activities. Easily distracted and drawn from task at hand.  Excessive and/or unrealistic worry that is difficult to control occurring more days than not for at least 6 months about a number of events or activities.  Feelings of hopelessness, worthlessness, or inappropriate guilt.  Lack of energy.   Poor concentration  and indecisiveness.  Restless and fidgety; unable to be sedentary for more than a short time.   Client Treatment Preferences: Continue with current therapist   Healthcare consumer's goal for treatment:  Psychologist, [name], Ph.D. will support the patient's ability to achieve the goals identified. Cognitive Behavioral Therapy, Dialectical Behavioral Therapy, Motivational Interviewing, SPACE, parent training, and other  evidenced-based practices will be used to promote progress towards healthy functioning.   Healthcare consumer Olivia [name] will: Actively participate in therapy, working towards healthy functioning.    *Justification for Continuation/Discontinuation of Goal: R=Revised, O=Ongoing, A=Achieved, D=Discontinued  Goal 1) Develop healthy interpersonal relationships that lead to the alleviation and help prevent the relapse of depression.  5 Point Likert rating baseline date: 05/28/2021 Target Date Goal Was reviewed Status Code Progress towards goal/Likert rating  05/29/2023 05/28/2022         O 3/5 - learning to set better limits and boundaries, still hasn't been able to regulate her daily routines             Goal 2) Learn and implement coping skills that result in a reduction of anxiety and worry, and improved daily functioning.  5 Point Likert rating baseline date: 05/28/2021  Target Date Goal Was reviewed Status Code Progress towards goal/Likert rating  05/29/2023 05/28/2022          O 2/5 - learning to use her skills more consistently             Goal 3) Combine skills learned in therapy into a new daily approach to managing ADHD.  5 Point Likert rating baseline date: 05/28/2021  Target Date Goal Was reviewed Status Code Progress towards goal/Likert rating  05/29/2023 05/28/2022          O 2/5 - learning to use her skills more consistently             This plan has been reviewed and created by the following participants:  This plan will be reviewed at least every 12 months. Date Behavioral Health Clinician Date Guardian/Patient   05/28/2022 [name], Ph.D.   05/28/2022 Olivia [name] reports that she's having an easier time with her diet.  She had been discouraged about not making progress but this week she felt better.  We d/e alternative ways to notice progress that isn't about the numbers (ie., improved psoriasis, clothes fit better).     The Thanksgiving holiday is this week and she is still not communicating with her daughter.  We d/p that she is having an easier time but it's on her mind.  We also d/ and p/s how to manage her mother's trying to make an issue of her daughter's choice to not speak to her.   Home Practice: journal of weekly prompts    [name], PhD

## 2022-09-24 ENCOUNTER — Ambulatory Visit: Payer: No Typology Code available for payment source | Admitting: Psychology

## 2022-09-30 ENCOUNTER — Ambulatory Visit (INDEPENDENT_AMBULATORY_CARE_PROVIDER_SITE_OTHER): Payer: No Typology Code available for payment source | Admitting: Psychology

## 2022-09-30 DIAGNOSIS — F324 Major depressive disorder, single episode, in partial remission: Secondary | ICD-10-CM

## 2022-09-30 NOTE — Progress Notes (Signed)
PROGRESS NOTE:   Name: Olivia [name] Date: 09/30/2022 MRN: [id number] DOB: [birth year] PCP: Leamon Arnt, MD  Time spent: 4:00 - 4:55 PM  Annual Review: 05/29/2023  Today I met with  Olivia [name] in remote video (WebEx) face-to-face individual psychotherapy.  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 She is normally a high functioning and driven individual and it is a struggle to get motivated to do anything. If it is necessary for work she does it and does a good job, but it is not the same. She has gained 70 pounds in less than 9 months and it has impacted her self image. She is also peri menopausal and these hormonal changes are also likely to be contributing to her changing mood states.   A year and a half ago her father had quadrupel by-pass surgery and he was recently diagnosed with cancer. 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  [place] states that she was diagnosed with adult ADHD and responded well to medications.  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)   Son: Olivia [name] (25) he is on the ASD, he attends [employer]   Mental Status Exam: Appearance:   Casual     Behavior:  Appropriate  Motor:  Normal  Speech/Language:   NA  Affect:  Appropriate  Mood:  normal  Thought process:  normal  Thought content:    WNL  Sensory/Perceptual disturbances:    WNL  Orientation:  oriented to person, place, time/date, situation, and day of week  Attention:  Good  Concentration:  Good  Memory:  WNL  Fund of knowledge:   Good  Insight:    Good  Judgment:   Good  Impulse Control:  Good    Diagnoses:  Major depression single episode, in partial remission  ([place])   Individualized Treatment Plan                Strengths: bright, verbal, motivated, self aware, self advocate   Supports: spouse, family, friends, colleagues   Goal/Needs for Treatment:  In order of importance to patient  1) Develop healthy interpersonal relationships that lead to the alleviation and help prevent the relapse of depression.  2) Learn and implement coping skills that result in a reduction of anxiety and worry, and improved functioning.   3) Combine skills learned in therapy into a new daily approach to managing ADHD.   Client Statement of Needs: requests assistance with piecing back together her life as it once was before she got depressed (burnt out).   Treatment Level: Weekly Individual Outpatient Psychotherapy.  Symptoms: autonomic hyperactivity (e.g., palpitations, shortness of breath, dry mouth, trouble swallowing, nausea, diarrhea).  Childhood history of Attention Deficit Disorder (ADD) that was either diagnosed or later concluded due to the symptoms of behavioral problems at school, impulsivity, temper outbursts, and lack of concentration.  Depressed or irritable mood. Diminished interest in or enjoyment of activities. Easily distracted and drawn from task at hand.  Excessive and/or unrealistic worry that is difficult to control occurring more days than not for at least 6 months about a number of events or activities.  Feelings of hopelessness, worthlessness, or inappropriate guilt.  Lack of energy.   Poor  concentration and indecisiveness.  Restless and fidgety; unable to be sedentary for more than a short time.   Client Treatment Preferences: Continue with current therapist   Healthcare consumer's goal for treatment:  Psychologist, [name], Ph.D. will support the patient's ability to achieve the goals identified. Cognitive Behavioral Therapy, Dialectical Behavioral Therapy, Motivational Interviewing, SPACE, parent training, and other  evidenced-based practices will be used to promote progress towards healthy functioning.   Healthcare consumer Paiden "Olivia [name]" Errickson will: Actively participate in therapy, working towards healthy functioning.    *Justification for Continuation/Discontinuation of Goal: R=Revised, O=Ongoing, A=Achieved, D=Discontinued  Goal 1) Develop healthy interpersonal relationships that lead to the alleviation and help prevent the relapse of depression.  5 Point Likert rating baseline date: 05/28/2021 Target Date Goal Was reviewed Status Code Progress towards goal/Likert rating  05/29/2023 05/28/2022         O 3/5 - learning to set better limits and boundaries, still hasn't been able to regulate her daily routines             Goal 2) Learn and implement coping skills that result in a reduction of anxiety and worry, and improved daily functioning.  5 Point Likert rating baseline date: 05/28/2021  Target Date Goal Was reviewed Status Code Progress towards goal/Likert rating  05/29/2023 05/28/2022          O 2/5 - learning to use her skills more consistently             Goal 3) Combine skills learned in therapy into a new daily approach to managing ADHD.  5 Point Likert rating baseline date: 05/28/2021  Target Date Goal Was reviewed Status Code Progress towards goal/Likert rating  05/29/2023 05/28/2022          O 2/5 - learning to use her skills more consistently             This plan has been reviewed and created by the following participants:  This plan will be reviewed at least every 12 months. Date Behavioral Health Clinician Date Guardian/Patient   05/28/2022 [name], Ph.D.   05/28/2022 [name]" Danaya Geddis reports that Thanksgiving went well.  She didn't have to speak to her mother about not mentioning her daughter because she "behaved herself."  Olivia [name] was happy to say that she didn't let herself get maudlin or upset and she felt good.  She also talked to her son  about minimizing Christmas decorations and he responded well.  Olivia [name] states that she f/t with her homework and it made her feel better.     Home Practice: journal of weekly prompts    [name], PhD

## 2022-10-01 ENCOUNTER — Ambulatory Visit: Payer: No Typology Code available for payment source | Admitting: Psychology

## 2022-10-07 ENCOUNTER — Ambulatory Visit (INDEPENDENT_AMBULATORY_CARE_PROVIDER_SITE_OTHER): Payer: No Typology Code available for payment source | Admitting: Psychology

## 2022-10-07 DIAGNOSIS — F324 Major depressive disorder, single episode, in partial remission: Secondary | ICD-10-CM

## 2022-10-07 NOTE — Progress Notes (Signed)
PROGRESS NOTE:   Name: KIYLA RINGLER Date: 10/07/2022 MRN: [id number] DOB: 1966/01/24 PCP: Leamon Arnt, MD  Time spent: 4:00 - 4:55 PM  Annual Review: 05/29/2023  Today I met with  [name] in remote video (WebEx) face-to-face individual psychotherapy.  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 She is normally a high functioning and driven individual and it is a struggle to get motivated to do anything. If it is necessary for work she does it and does a good job, but it is not the same. She has gained 70 pounds in less than 9 months and it has impacted her self image. She is also peri menopausal and these hormonal changes are also likely to be contributing to her changing mood states.   A year and a half ago her father had quadrupel by-pass surgery and he was recently diagnosed with cancer. 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  [place] states that she was diagnosed with adult ADHD and responded well to medications.  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)   Son: [name] (68) he is on the ASD, he attends [employer]   Mental Status Exam: Appearance:   Casual     Behavior:  Appropriate  Motor:  Normal  Speech/Language:   NA  Affect:  Appropriate  Mood:  normal  Thought process:  normal  Thought content:    WNL  Sensory/Perceptual disturbances:    WNL  Orientation:  oriented to person, place, time/date, situation, and day of week  Attention:  Good  Concentration:  Good  Memory:  WNL  Fund of knowledge:   Good  Insight:    Good  Judgment:   Good  Impulse Control:  Good    Diagnoses:  Major depression single episode, in partial remission  ([place])   Individualized Treatment Plan                Strengths: bright, verbal, motivated, self aware, self advocate   Supports: spouse, family, friends, colleagues   Goal/Needs for Treatment:  In order of importance to patient  1) Develop healthy interpersonal relationships that lead to the alleviation and help prevent the relapse of depression.  2) Learn and implement coping skills that result in a reduction of anxiety and worry, and improved functioning.   3) Combine skills learned in therapy into a new daily approach to managing ADHD.   Client Statement of Needs: requests assistance with piecing back together her life as it once was before she got depressed (burnt out).   Treatment Level: Weekly Individual Outpatient Psychotherapy.  Symptoms: autonomic hyperactivity (e.g., palpitations, shortness of breath, dry mouth, trouble swallowing, nausea, diarrhea).  Childhood history of Attention Deficit Disorder (ADD) that was either diagnosed or later concluded due to the symptoms of behavioral problems at school, impulsivity, temper outbursts, and lack of concentration.  Depressed or irritable mood. Diminished interest in or enjoyment of activities. Easily distracted and drawn from task at hand.  Excessive and/or unrealistic worry that is difficult to control occurring more days than not for at least 6 months about a number of events or activities.  Feelings of hopelessness, worthlessness, or inappropriate guilt.  Lack of energy.   Poor concentration  and indecisiveness.  Restless and fidgety; unable to be sedentary for more than a short time.   Client Treatment Preferences: Continue with current therapist   Healthcare consumer's goal for treatment:  Psychologist, [name], Ph.D. will support the patient's ability to achieve the goals identified. Cognitive Behavioral Therapy, Dialectical Behavioral Therapy, Motivational Interviewing, SPACE, parent training, and other  evidenced-based practices will be used to promote progress towards healthy functioning.   Healthcare consumer Debbera "[name]" Wojtowicz will: Actively participate in therapy, working towards healthy functioning.    *Justification for Continuation/Discontinuation of Goal: R=Revised, O=Ongoing, A=Achieved, D=Discontinued  Goal 1) Develop healthy interpersonal relationships that lead to the alleviation and help prevent the relapse of depression.  5 Point Likert rating baseline date: 05/28/2021 Target Date Goal Was reviewed Status Code Progress towards goal/Likert rating  05/29/2023 05/28/2022         O 3/5 - learning to set better limits and boundaries, still hasn't been able to regulate her daily routines             Goal 2) Learn and implement coping skills that result in a reduction of anxiety and worry, and improved daily functioning.  5 Point Likert rating baseline date: 05/28/2021  Target Date Goal Was reviewed Status Code Progress towards goal/Likert rating  05/29/2023 05/28/2022          O 2/5 - learning to use her skills more consistently             Goal 3) Combine skills learned in therapy into a new daily approach to managing ADHD.  5 Point Likert rating baseline date: 05/28/2021  Target Date Goal Was reviewed Status Code Progress towards goal/Likert rating  05/29/2023 05/28/2022          O 2/5 - learning to use her skills more consistently             This plan has been reviewed and created by the following participants:  This plan will be reviewed at least every 12 months. Date Behavioral Health Clinician Date Guardian/Patient   05/28/2022 [name], Ph.D.   05/28/2022 [name]" Pavneet Markwood reports that she has struggled with sugar since the holiday.  However, she mentioned that she has managed to not fall into the all-or-nothing thinking typical of her and "got back on the wagon."  While things are going well in her home, her sister and niece had  a crisis over the weekend.  The situation helped her to appreciate her own immediate family and was able to talk to her father about family dynamics.  Home Practice: journal of weekly prompts   [name], PhD

## 2022-10-08 ENCOUNTER — Ambulatory Visit: Payer: No Typology Code available for payment source | Admitting: Psychology

## 2022-10-14 ENCOUNTER — Ambulatory Visit (INDEPENDENT_AMBULATORY_CARE_PROVIDER_SITE_OTHER): Payer: No Typology Code available for payment source | Admitting: Psychology

## 2022-10-14 DIAGNOSIS — F324 Major depressive disorder, single episode, in partial remission: Secondary | ICD-10-CM

## 2022-10-14 NOTE — Progress Notes (Signed)
PROGRESS NOTE:   Name: Olivia [name] Date: 10/14/2022 MRN: [id number] DOB: Jan 12, 1966 PCP: Leamon Arnt, MD  Time spent: 4:00 - 4:55 PM  Annual Review: 05/29/2023  Today I met with  Olivia [name] in remote video (WebEx) face-to-face individual psychotherapy.  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 She is normally a high functioning and driven individual and it is a struggle to get motivated to do anything. If it is necessary for work she does it and does a good job, but it is not the same. She has gained 70 pounds in less than 9 months and it has impacted her self image. She is also peri menopausal and these hormonal changes are also likely to be contributing to her changing mood [name].   A year and a half ago her father had quadrupel by-pass surgery and he was recently diagnosed with cancer. 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  Olivia [name] that she was diagnosed with adult ADHD and responded well to medications.  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)   Son: Olivia [name] (49) he is on the ASD, he attends [employer]   Mental Status Exam: Appearance:   Casual     Behavior:  Appropriate  Motor:  Normal  Speech/Language:   NA  Affect:  Appropriate  Mood:  normal  Thought process:  normal  Thought content:    WNL  Sensory/Perceptual disturbances:    WNL  Orientation:  oriented to person, place, time/date, situation, and day of week  Attention:  Good  Concentration:  Good  Memory:  WNL  Fund of knowledge:   Good  Insight:    Good  Judgment:   Good  Impulse Control:  Good    Diagnoses:  Major depression single episode, in partial remission  (Olivia [name])   Individualized Treatment Plan                Strengths: bright, verbal, motivated, self aware, self advocate   Supports: spouse, family, friends, colleagues   Goal/Needs for Treatment:  In order of importance to patient  1) Develop healthy interpersonal relationships that lead to the alleviation and help prevent the relapse of depression.  2) Learn and implement coping skills that result in a reduction of anxiety and worry, and improved functioning.   3) Combine skills learned in therapy into a new daily approach to managing ADHD.   Client Statement of Needs: requests assistance with piecing back together her life as it once was before she got depressed (burnt out).   Treatment Level: Weekly Individual Outpatient Psychotherapy.  Symptoms: autonomic hyperactivity (e.g., palpitations, shortness of breath, dry mouth, trouble swallowing, nausea, diarrhea).  Childhood history of Attention Deficit Disorder (ADD) that was either diagnosed or later concluded due to the symptoms of behavioral problems at school, impulsivity, temper outbursts, and lack of concentration.  Depressed or irritable mood. Diminished interest in or enjoyment of activities. Easily distracted and drawn from task at hand.  Excessive and/or unrealistic worry that is difficult to control occurring more days than not for at least 6 months about a number of events or activities.  Feelings of hopelessness, worthlessness, or inappropriate guilt.  Lack of energy.  Poor concentration and indecisiveness.  Restless and fidgety; unable to be sedentary for more than a short time.   Client Treatment Preferences: Continue with current therapist   Healthcare consumer's goal for treatment:  Psychologist, [name], Ph.D. will support the patient's ability to achieve the goals identified. Cognitive Behavioral Therapy, Dialectical Behavioral Therapy, Motivational Interviewing, SPACE, parent training, and other  evidenced-based practices will be used to promote progress towards healthy functioning.   Healthcare consumer Olivia [name] will: Actively participate in therapy, working towards healthy functioning.    *Justification for Continuation/Discontinuation of Goal: R=Revised, O=Ongoing, A=Achieved, D=Discontinued  Goal 1) Develop healthy interpersonal relationships that lead to the alleviation and help prevent the relapse of depression.  5 Point Likert rating baseline date: 05/28/2021 Target Date Goal Was reviewed Status Code Progress towards goal/Likert rating  05/29/2023 05/28/2022         O 3/5 - learning to set better limits and boundaries, still hasn't been able to regulate her daily routines             Goal 2) Learn and implement coping skills that result in a reduction of anxiety and worry, and improved daily functioning.  5 Point Likert rating baseline date: 05/28/2021  Target Date Goal Was reviewed Status Code Progress towards goal/Likert rating  05/29/2023 05/28/2022          O 2/5 - learning to use her skills more consistently             Goal 3) Combine skills learned in therapy into a new daily approach to managing ADHD.  5 Point Likert rating baseline date: 05/28/2021  Target Date Goal Was reviewed Status Code Progress towards goal/Likert rating  05/29/2023 05/28/2022          O 2/5 - learning to use her skills more consistently             This plan has been reviewed and created by the following participants:  This plan will be reviewed at least every 12 months. Date Behavioral Health Clinician Date Guardian/Patient   05/28/2022 [name], Ph.D.   05/28/2022 Olivia [name] reports that she was making time to go away for a weekend with her friend.  I praised her for taking time out for herself and to nuture her renewed friendship.    Olivia [name] also talked about reaching out to her mother who has been giving her the silent treatment.   We d/p how it was important for her to clear the air before the holidays.  Lastly, we d/p the importance of resisting the typical family dynamic and setting clear boundaries.  Lastly, we talked about a strategy    Home Practice: journal on weekly prompts   [name], PhD

## 2022-10-15 ENCOUNTER — Ambulatory Visit: Payer: No Typology Code available for payment source | Admitting: Psychology

## 2022-10-16 ENCOUNTER — Other Ambulatory Visit (HOSPITAL_BASED_OUTPATIENT_CLINIC_OR_DEPARTMENT_OTHER): Payer: Self-pay

## 2022-10-16 MED ORDER — TRIAMCINOLONE ACETONIDE 0.1 % EX CREA
TOPICAL_CREAM | CUTANEOUS | 0 refills | Status: DC
  Filled 2022-10-16: qty 454, 30d supply, fill #0

## 2022-10-17 ENCOUNTER — Encounter: Payer: Self-pay | Admitting: *Deleted

## 2022-10-21 ENCOUNTER — Ambulatory Visit: Payer: No Typology Code available for payment source | Admitting: Psychology

## 2022-10-22 ENCOUNTER — Ambulatory Visit: Payer: No Typology Code available for payment source | Admitting: Psychology

## 2022-11-06 ENCOUNTER — Ambulatory Visit (INDEPENDENT_AMBULATORY_CARE_PROVIDER_SITE_OTHER): Payer: 59 | Admitting: Psychology

## 2022-11-06 DIAGNOSIS — F324 Major depressive disorder, single episode, in partial remission: Secondary | ICD-10-CM

## 2022-11-06 NOTE — Progress Notes (Signed)
PROGRESS NOTE:   Name: Olivia [name] Date: 11/06/2022 MRN: [id number] DOB: October 03, 1966 PCP: Leamon Arnt, MD  Time spent: 4:00 - 4:55 PM  Annual Review: 05/29/2023  Today I met with  Olivia [name] in remote video (WebEx) face-to-face individual psychotherapy.  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 She is normally a high functioning and driven individual and it is a struggle to get motivated to do anything. If it is necessary for work she does it and does a good job, but it is not the same. She has gained 70 pounds in less than 9 months and it has impacted her self image. She is also peri menopausal and these hormonal changes are also likely to be contributing to her changing mood states.   A year and a half ago her father had quadrupel by-pass surgery and he was recently diagnosed with cancer. 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  Olivia [name] states that she was diagnosed with adult ADHD and responded well to medications.  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)   Son: Olivia [name] (50) he is on the ASD, he attends [employer]   Mental Status Exam: Appearance:   Casual     Behavior:  Appropriate  Motor:  Normal  Speech/Language:   NA  Affect:  Appropriate  Mood:  normal  Thought process:  normal  Thought content:    WNL  Sensory/Perceptual disturbances:    WNL  Orientation:  oriented to person, place, time/date, situation, and day of week  Attention:  Good  Concentration:  Good  Memory:  WNL  Fund of knowledge:   Good  Insight:    Good  Judgment:   Good  Impulse Control:  Good    Diagnoses:  Major depression single episode, in partial remission  (Olivia [name])   Individualized Treatment Plan                Strengths: bright, verbal, motivated, self aware, self advocate   Supports: spouse, family, friends, colleagues   Goal/Needs for Treatment:  In order of importance to patient  1) Develop healthy interpersonal relationships that lead to the alleviation and help prevent the relapse of depression.  2) Learn and implement coping skills that result in a reduction of anxiety and worry, and improved functioning.   3) Combine skills learned in therapy into a new daily approach to managing ADHD.   Client Statement of Needs: requests assistance with piecing back together her life as it once was before she got depressed (burnt out).   Treatment Level: Weekly Individual Outpatient Psychotherapy.  Symptoms: autonomic hyperactivity (e.g., palpitations, shortness of breath, dry mouth, trouble swallowing, nausea, diarrhea).  Childhood history of Attention Deficit Disorder (ADD) that was either diagnosed or later concluded due to the symptoms of behavioral problems at school, impulsivity, temper outbursts, and lack of concentration.  Depressed or irritable mood. Diminished interest in or enjoyment of activities. Easily distracted and drawn from task at hand.  Excessive and/or unrealistic worry that is difficult to control occurring more days than not for at least 6 months about a number of events or activities.  Feelings of hopelessness, worthlessness, or inappropriate guilt.  Lack of energy.   Poor  concentration and indecisiveness.  Restless and fidgety; unable to be sedentary for more than a short time.   Client Treatment Preferences: Continue with current therapist   Healthcare consumer's goal for treatment:  Psychologist, [name], Ph.D. will support the patient's ability to achieve the goals identified. Cognitive Behavioral Therapy, Dialectical Behavioral Therapy, Motivational Interviewing, SPACE, parent training, and other  evidenced-based practices will be used to promote progress towards healthy functioning.   Healthcare consumer Olivia [name] will: Actively participate in therapy, working towards healthy functioning.    *Justification for Continuation/Discontinuation of Goal: R=Revised, O=Ongoing, A=Achieved, D=Discontinued  Goal 1) Develop healthy interpersonal relationships that lead to the alleviation and help prevent the relapse of depression.  5 Point Likert rating baseline date: 05/28/2021 Target Date Goal Was reviewed Status Code Progress towards goal/Likert rating  05/29/2023 05/28/2022         O 3/5 - learning to set better limits and boundaries, still hasn't been able to regulate her daily routines             Goal 2) Learn and implement coping skills that result in a reduction of anxiety and worry, and improved daily functioning.  5 Point Likert rating baseline date: 05/28/2021  Target Date Goal Was reviewed Status Code Progress towards goal/Likert rating  05/29/2023 05/28/2022          O 2/5 - learning to use her skills more consistently             Goal 3) Combine skills learned in therapy into a new daily approach to managing ADHD.  5 Point Likert rating baseline date: 05/28/2021  Target Date Goal Was reviewed Status Code Progress towards goal/Likert rating  05/29/2023 05/28/2022          O 2/5 - learning to use her skills more consistently             This plan has been reviewed and created by the following participants:  This plan will be reviewed at least every 12 months. Date Behavioral Health Clinician Date Guardian/Patient   05/28/2022 [name], Ph.D.   05/28/2022 Olivia [name]" Olivia [name] reports that she had a hard time this past weekend after losing a colleague.  It triggered a migraine, she ate lots of comfort foods  and it has thrown off her sleep schedule.  She felt confused and disoriented and wasn't sure what to do for her team.  I  suggested she keep the meeting time but use it to "hold space" for each other to p/ what occurred.  We d/p her feelings of anger and loss.  We talked about how she would get back on track.    Olivia [name] enjoyed her girls weekend.  It felt so good to talk a have no agenda other than enjoy each others company.     Home Practice: journal on weekly prompts   [name], PhD

## 2022-11-11 ENCOUNTER — Ambulatory Visit (INDEPENDENT_AMBULATORY_CARE_PROVIDER_SITE_OTHER): Payer: 59 | Admitting: Psychology

## 2022-11-11 DIAGNOSIS — F324 Major depressive disorder, single episode, in partial remission: Secondary | ICD-10-CM | POA: Diagnosis not present

## 2022-11-11 NOTE — Progress Notes (Signed)
PROGRESS NOTE:   Name: Olivia [name] Date: 11/11/2022 MRN: [id number] DOB: 1966/10/15 PCP: Olivia Arnt, MD  Time spent: 4:00 - 4:55 PM  Annual Review: 05/29/2023  Today I met with  Olivia [name] in remote video (WebEx) face-to-face individual psychotherapy.  Distance Site: [name]: Dr Olivia [name] Remote Office Consent: Obtained verbal consent to transmit  session remotely    Reason for Visit /Presenting Problem:  Following the discovery of a parathyroid tumor her life has changed dramatically. She is normally a high functioning and driven individual and it is a struggle to get motivated to do anything. If it is necessary for work she does it and does a good job, but it is not the same. She has gained 70 pounds in less than 9 months and it has impacted her self image. She is also peri menopausal and these hormonal changes are also likely to be contributing to her changing mood states.   A year and a half ago her father had quadrupel by-pass surgery and he was recently diagnosed with cancer. 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  Olivia [name] states that she was diagnosed with adult ADHD and responded well to medications.  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)   Son: Olivia [name] (31) he is on the ASD, he attends [employer]   Mental Status Exam: Appearance:   Casual     Behavior:  Appropriate  Motor:  Normal  Speech/Language:   NA  Affect:  Appropriate  Mood:  normal  Thought process:  normal  Thought content:    WNL  Sensory/Perceptual disturbances:    WNL  Orientation:  oriented to person, place, time/date, situation, and day of week  Attention:  Good  Concentration:  Good  Memory:  WNL  Fund of knowledge:   Good  Insight:    Good  Judgment:   Good  Impulse Control:  Good    Diagnoses:  Major depression single episode, in partial remission  (Olivia [name])   Individualized Treatment Plan                Strengths: bright, verbal, motivated, self aware, self advocate   Supports: spouse, family, friends, colleagues   Goal/Needs for Treatment:  In order of importance to patient  1) Develop healthy interpersonal relationships that lead to the alleviation and help prevent the relapse of depression.  2) Learn and implement coping skills that result in a reduction of anxiety and worry, and improved functioning.   3) Combine skills learned in therapy into a new daily approach to managing ADHD.   Client Statement of Needs: requests assistance with piecing back together her life as it once was before she got depressed (burnt out).   Treatment Level: Weekly Individual Outpatient Psychotherapy.  Symptoms: autonomic hyperactivity (e.g., palpitations, shortness of breath, dry mouth, trouble swallowing, nausea, diarrhea).  Childhood history of Attention Deficit Disorder (ADD) that was either diagnosed or later concluded due to the symptoms of behavioral problems at school, impulsivity, temper outbursts, and lack of concentration.  Depressed or irritable mood. Diminished interest in or enjoyment of activities. Easily distracted and drawn from task at hand.  Excessive and/or unrealistic worry that is difficult to control occurring more days than not for at least 6 months about a number of events or activities.  Feelings of hopelessness, worthlessness, or inappropriate guilt.  Lack of energy.   Poor  concentration and indecisiveness.  Restless and fidgety; unable to be sedentary for more than a short time.   Client Treatment Preferences: Continue with current therapist   Healthcare consumer's goal for treatment:  Psychologist, Olivia [name], Ph.D. will support the patient's ability to achieve the goals identified. Cognitive Behavioral Therapy, Dialectical Behavioral Therapy, Motivational Interviewing, SPACE, parent training, and other  evidenced-based practices will be used to promote progress towards healthy functioning.   Healthcare consumer Olivia [name] will: Actively participate in therapy, working towards healthy functioning.    *Justification for Continuation/Discontinuation of Goal: R=Revised, O=Ongoing, A=Achieved, D=Discontinued  Goal 1) Develop healthy interpersonal relationships that lead to the alleviation and help prevent the relapse of depression.  5 Point Likert rating baseline date: 05/28/2021 Target Date Goal Was reviewed Status Code Progress towards goal/Likert rating  05/29/2023 05/28/2022         O 3/5 - learning to set better limits and boundaries, still hasn't been able to regulate her daily routines             Goal 2) Learn and implement coping skills that result in a reduction of anxiety and worry, and improved daily functioning.  5 Point Likert rating baseline date: 05/28/2021  Target Date Goal Was reviewed Status Code Progress towards goal/Likert rating  05/29/2023 05/28/2022          O 2/5 - learning to use her skills more consistently             Goal 3) Combine skills learned in therapy into a new daily approach to managing ADHD.  5 Point Likert rating baseline date: 05/28/2021  Target Date Goal Was reviewed Status Code Progress towards goal/Likert rating  05/29/2023 05/28/2022          O 2/5 - learning to use her skills more consistently             This plan has been reviewed and created by the following participants:  This plan will be reviewed at least every 12 months. Date Behavioral Health Clinician Date Guardian/Patient   05/28/2022 Olivia [name], Ph.D.   05/28/2022 Olivia [name]" Olivia [name] reports that she was stressed this week.  They had a few more deaths related to their team and her son was sick this week (bilateral pneumonia).  We d/p work and dealing with the stress.  She focused on self care and did a better job of picking a couple of  things to focus on.  Olivia [name] also stated that she needed to re-certify for her NP and was struggling to get around to it.  We talked about ADHD and strategies that work for this type of situation.  She agreed to block out time for CEs, to register and pay for those Ces, in effect creating a deadline/commitment which she is more likely to successfully complete.  Olivia [name] states that she had an issue with her sister which she shared.  We d/p some issues related to her niece, poor judgment and bad decisions.    Home Practice: journal on weekly prompts   Olivia Crochet, PhD

## 2022-11-18 ENCOUNTER — Ambulatory Visit: Payer: Self-pay | Admitting: Psychology

## 2022-11-19 ENCOUNTER — Ambulatory Visit (INDEPENDENT_AMBULATORY_CARE_PROVIDER_SITE_OTHER): Payer: Commercial Managed Care - PPO | Admitting: Family Medicine

## 2022-11-19 ENCOUNTER — Other Ambulatory Visit (HOSPITAL_COMMUNITY): Payer: Self-pay

## 2022-11-19 ENCOUNTER — Encounter: Payer: Self-pay | Admitting: Family Medicine

## 2022-11-19 ENCOUNTER — Other Ambulatory Visit (HOSPITAL_BASED_OUTPATIENT_CLINIC_OR_DEPARTMENT_OTHER): Payer: Self-pay

## 2022-11-19 VITALS — BP 132/70 | HR 98 | Temp 98.7°F | Ht 68.0 in | Wt 336.8 lb

## 2022-11-19 DIAGNOSIS — B9689 Other specified bacterial agents as the cause of diseases classified elsewhere: Secondary | ICD-10-CM | POA: Diagnosis not present

## 2022-11-19 DIAGNOSIS — R051 Acute cough: Secondary | ICD-10-CM | POA: Diagnosis not present

## 2022-11-19 DIAGNOSIS — J4521 Mild intermittent asthma with (acute) exacerbation: Secondary | ICD-10-CM

## 2022-11-19 DIAGNOSIS — J208 Acute bronchitis due to other specified organisms: Secondary | ICD-10-CM | POA: Diagnosis not present

## 2022-11-19 DIAGNOSIS — F339 Major depressive disorder, recurrent, unspecified: Secondary | ICD-10-CM | POA: Diagnosis not present

## 2022-11-19 LAB — POC COVID19 BINAXNOW: SARS Coronavirus 2 Ag: NEGATIVE

## 2022-11-19 MED ORDER — GUAIFENESIN-CODEINE 100-10 MG/5ML PO SOLN
5.0000 mL | Freq: Four times a day (QID) | ORAL | 0 refills | Status: DC | PRN
  Filled 2022-11-19: qty 120, 6d supply, fill #0

## 2022-11-19 MED ORDER — AZITHROMYCIN 250 MG PO TABS
ORAL_TABLET | ORAL | 0 refills | Status: DC
  Filled 2022-11-19: qty 6, 5d supply, fill #0

## 2022-11-19 MED ORDER — PREDNISONE 20 MG PO TABS
ORAL_TABLET | ORAL | 0 refills | Status: DC
  Filled 2022-11-19: qty 15, 5d supply, fill #0

## 2022-11-19 NOTE — Progress Notes (Signed)
Subjective  CC:  Chief Complaint  Patient presents with   Cough    HPI: SUBJECTIVE:  Olivia [name] is a 57 y.o. female who complains of congestion, nasal blockage, post nasal drip, cough described as harsh, paroxysmal, and productive and denies sinus, high fevers, SOB, chest pain or significant GI symptoms. Symptoms have been present for 2-3 days. Son with pneumonia now. She denies a history of anorexia, dizziness, vomiting and wheezing. She has allergic asthma and has been wheezing. Patient does not smoke cigarettes. Using home nebulizer to help with tightness in chest. No pleuritic chest pain. Had had low grade fever. No gi sxs.   Depression: seeing psychotherapist regularly over last year and has made great strides in recovering from her active depression stemmed from burn out and extreme exhaustion. Feeling much better and has cut back on work load. Remains on lexapro and wellbutrin.   Overdue for f/u and cpe.   Assessment  1. Mild intermittent asthma with acute exacerbation   2. Acute bacterial bronchitis   3. Acute cough   4. Major depression, recurrent, chronic (HCC)      Plan  Discussion:  Treat for bacterial bronchitis due to prolonged course and worsening symptoms. Asthma exacerbation as well: zpak, albuterol, pred burst and cough syrup.  Education regarding differences between viral and bacterial infections and treatment options are discussed.  Supportive care measures are recommended.  We discussed the use of mucolytic's, decongestants, antihistamines and antitussives as needed.  Tylenol or Advil are recommended if needed.  Depression: much improved. Continue meds and therapy.   Follow up: due cpe   Orders Placed This Encounter  Procedures   POC COVID-19   Meds ordered this encounter  Medications   azithromycin (ZITHROMAX) 250 MG tablet    Sig: Take 2 tabs today, then 1 tablet daily for 4 days    Dispense:  6 each    Refill:  0   predniSONE (DELTASONE) 20 MG  tablet    Sig: Take 3 tabs daily for 5 days    Dispense:  15 tablet    Refill:  0   guaiFENesin-codeine 100-10 MG/5ML syrup    Sig: Take 5 mLs by mouth every 6 (six) hours as needed for cough.    Dispense:  120 mL    Refill:  0      I reviewed the patients updated PMH, FH, and SocHx.  Social History: Patient  reports that she has never smoked. She has never used smokeless tobacco. She reports that she does not drink alcohol and does not use drugs.  Patient Active Problem List   Diagnosis Date Noted   Post-surgical hypothyroidism 01/17/2021    Priority: High   Major depression, recurrent, chronic ([place]) 11/17/2018    Priority: High   History of laparoscopic adjustable gastric banding 05/08/2012    Priority: High   Mixed hyperlipidemia 04/15/2008    Priority: High   Morbid obesity ([place]) 04/15/2008    Priority: High   Ventricular bigeminy 11/17/2018    Priority: Medium    Atopic dermatitis 08/19/2018    Priority: Medium    Elevated coronary artery calcium score, 90th percentile for age and gender 08/16/2018    Priority: Medium    Psoriasis 01/05/2018    Priority: Medium    IUD (intrauterine device) in place, [place] 12/07/2017    Priority: Medium    PCOS (polycystic ovarian syndrome) 10/25/2014    Priority: Medium    Migraine with aura 01/11/2014  Priority: Medium    Vitamin D deficiency 01/05/2018    Priority: Low   Allergic rhinitis 04/15/2008    Priority: Low   Allergic asthma 04/15/2008    Priority: Low   History of total thyroidectomy 01/17/2021   History of parathyroid surgery 2021 01/17/2021   Hyperparathyroidism, primary ([place]) 05/22/2020   Chronic low back pain with sciatica 01/05/2018    Review of Systems: Cardiovascular: negative for chest pain Respiratory: negative for SOB or hemoptysis Gastrointestinal: negative for abdominal pain Genitourinary: negative for dysuria or gross hematuria Current Meds  Medication Sig   albuterol (PROVENTIL HFA) 108 (90  Base) MCG/ACT inhaler Inhale 2 puffs into the lungs every 6 (six) hours as needed for wheezing or shortness of breath.   albuterol (PROVENTIL) (2.5 MG/3ML) 0.083% nebulizer solution Take 3 mLs (2.5 mg total) by nebulization every 6 (six) hours as needed for wheezing or shortness of breath.   ALPRAZolam (XANAX) 0.5 MG tablet Take 1 tablet (0.5 mg total) by mouth 2 (two) times daily as needed for anxiety.   azithromycin (ZITHROMAX) 250 MG tablet Take 2 tabs today, then 1 tablet daily for 4 days   buPROPion (WELLBUTRIN XL) 150 MG 24 hr tablet Take 1 tablet (150 mg total) by mouth daily.   cetirizine (ZYRTEC) 10 MG tablet Take 10 mg by mouth daily as needed for allergies.    diclofenac sodium (VOLTAREN) 1 % GEL Apply topically to affected area qid (Patient taking differently: Apply 1 application  topically 4 (four) times daily as needed (pain).)   escitalopram (LEXAPRO) 20 MG tablet Take 1 tablet (20 mg total) by mouth daily.   famotidine (PEPCID) 20 MG tablet Take 20 mg by mouth daily as needed for heartburn or indigestion.   ferrous sulfate 325 (65 FE) MG EC tablet Take 325 mg by mouth daily with breakfast.   guaiFENesin-codeine 100-10 MG/5ML syrup Take 5 mLs by mouth every 6 (six) hours as needed for cough.   influenza vac split quadrivalent PF (FLUARIX) 0.5 ML injection Inject into the muscle.   levothyroxine (SYNTHROID) 112 MCG tablet Take 1 tablet (112 mcg total) by mouth daily.   liothyronine (CYTOMEL) 25 MCG tablet Take 1 tablet by mouth once daily.   magnesium 30 MG tablet Take 30 mg by mouth daily.   metoprolol tartrate (LOPRESSOR) 25 MG tablet Take 12.5 mg by mouth daily as needed.   Multiple Vitamin (MULITIVITAMIN WITH MINERALS) TABS Take 1 tablet by mouth daily.   mupirocin cream (BACTROBAN) 2 % Apply 1 application topically 2 (two) times daily.   nitroGLYCERIN (NITRODUR - DOSED IN MG/24 HR) 0.2 mg/hr patch Apply 1/4 patch daily to tendon for tendonitis.   Omega-3 Fatty Acids (FISH OIL)  1000 MG CAPS Take 1,000 mg by mouth daily.   predniSONE (DELTASONE) 20 MG tablet Take 3 tabs daily for 5 days   SYNTHROID 125 MCG tablet Take 1 tablet (125 mcg total) by mouth daily in the morning on an empty stomach.   triamcinolone cream (KENALOG) 0.1 % Apply as directed to affected area twice a day for 10-14 days as needed for flares   Turmeric (QC TUMERIC COMPLEX PO) Take 1 capsule by mouth daily.   Vitamin D, Cholecalciferol, 50 MCG (2000 UT) CAPS Take 2,000 Units by mouth daily.    Objective  Vitals: BP 132/70   Pulse 98   Temp 98.7 F (37.1 C)   Ht '5\' 8"'$  (1.727 m)   Wt (!) 336 lb 12.8 oz (152.8 kg)  SpO2 96%   BMI 51.21 kg/m  General: no acute distress but harsh cough present. No respiratory distress Psych:  Alert and oriented, normal mood and affect HEENT:  + cervical LAD Cardiovascular:  RRR without murmur. no edema Respiratory:  Good breath sounds bilaterally, upper exp wheeze present w/o rales Skin:  Warm, no rashes  Negative covid testing in office today.  Commons side effects, risks, benefits, and alternatives for medications and treatment plan prescribed today were discussed, and the patient expressed understanding of the given instructions. Patient is instructed to call or message via MyChart if he/she has any questions or concerns regarding our treatment plan. No barriers to understanding were identified. We discussed Red Flag symptoms and signs in detail. Patient expressed understanding regarding what to do in case of urgent or emergency type symptoms.  Medication list was reconciled, printed and provided to the patient in AVS. Patient instructions and summary information was reviewed with the patient as documented in the AVS. This note was prepared with assistance of Dragon voice recognition software. Occasional wrong-word or sound-a-like substitutions may have occurred due to the inherent limitations of voice recognition software

## 2022-11-19 NOTE — Patient Instructions (Signed)
Please return for your annual complete physical; please come fasting.    If you have any questions or concerns, please don't hesitate to send me a message via MyChart or call the office at [phone]. Thank you for visiting with [place] today! It's our pleasure caring for you.

## 2022-11-20 ENCOUNTER — Other Ambulatory Visit: Payer: Self-pay | Admitting: Family Medicine

## 2022-11-20 ENCOUNTER — Other Ambulatory Visit (HOSPITAL_BASED_OUTPATIENT_CLINIC_OR_DEPARTMENT_OTHER): Payer: Self-pay

## 2022-11-21 ENCOUNTER — Encounter: Payer: Self-pay | Admitting: Family Medicine

## 2022-11-21 ENCOUNTER — Other Ambulatory Visit (HOSPITAL_COMMUNITY): Payer: Self-pay

## 2022-11-21 ENCOUNTER — Other Ambulatory Visit: Payer: Self-pay

## 2022-11-21 MED ORDER — BUPROPION HCL ER (XL) 150 MG PO TB24
150.0000 mg | ORAL_TABLET | Freq: Every day | ORAL | 3 refills | Status: DC
  Filled 2022-11-21 – 2022-11-22 (×2): qty 90, 90d supply, fill #0
  Filled 2023-02-17 – 2023-02-24 (×2): qty 90, 90d supply, fill #1
  Filled 2023-05-20: qty 90, 90d supply, fill #2
  Filled 2023-08-27: qty 90, 90d supply, fill #3

## 2022-11-22 ENCOUNTER — Other Ambulatory Visit (HOSPITAL_BASED_OUTPATIENT_CLINIC_OR_DEPARTMENT_OTHER): Payer: Self-pay

## 2022-11-22 ENCOUNTER — Other Ambulatory Visit (HOSPITAL_COMMUNITY): Payer: Self-pay

## 2022-11-22 ENCOUNTER — Other Ambulatory Visit: Payer: Self-pay

## 2022-11-22 DIAGNOSIS — J4520 Mild intermittent asthma, uncomplicated: Secondary | ICD-10-CM

## 2022-11-22 MED ORDER — ALBUTEROL SULFATE (2.5 MG/3ML) 0.083% IN NEBU
2.5000 mg | INHALATION_SOLUTION | Freq: Four times a day (QID) | RESPIRATORY_TRACT | 12 refills | Status: AC | PRN
  Filled 2022-11-22 (×2): qty 75, 7d supply, fill #0

## 2022-11-25 ENCOUNTER — Ambulatory Visit (INDEPENDENT_AMBULATORY_CARE_PROVIDER_SITE_OTHER): Payer: Commercial Managed Care - PPO | Admitting: Psychology

## 2022-11-25 DIAGNOSIS — F324 Major depressive disorder, single episode, in partial remission: Secondary | ICD-10-CM | POA: Diagnosis not present

## 2022-11-25 NOTE — Progress Notes (Signed)
PROGRESS NOTE:   Name: Olivia [name] Date: 11/25/2022 MRN: [id number] DOB: 26-Sep-1966 PCP: Leamon Arnt, MD  Time spent: 4:00 - 4:55 PM  Annual Review: 05/29/2023  Today I met with  Olivia [name] in remote video (WebEx) face-to-face individual psychotherapy.  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 She is normally a high functioning and driven individual and it is a struggle to get motivated to do anything. If it is necessary for work she does it and does a good job, but it is not the same. She has gained 70 pounds in less than 9 months and it has impacted her self image. She is also peri menopausal and these hormonal changes are also likely to be contributing to her changing mood states.   A year and a half ago her father had quadrupel by-pass surgery and he was recently diagnosed with cancer. 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  Olivia [name] states that she was diagnosed with adult ADHD and responded well to medications.  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)   Son: Olivia [name] (71) he is on the ASD, he attends [employer]   Mental Status Exam: Appearance:   Casual     Behavior:  Appropriate  Motor:  Normal  Speech/Language:   NA  Affect:  Appropriate  Mood:  normal  Thought process:  normal  Thought content:    WNL  Sensory/Perceptual disturbances:    WNL  Orientation:  oriented to person, place, time/date, situation, and day of week  Attention:  Good  Concentration:  Good  Memory:  WNL  Fund of knowledge:   Good  Insight:    Good  Judgment:   Good  Impulse Control:  Good    Diagnoses:  Major depression single episode, in partial remission  ([place])   Individualized Treatment Plan                Strengths: bright, verbal, motivated, self aware, self advocate   Supports: spouse, family, friends, colleagues   Goal/Needs for Treatment:  In order of importance to patient  1) Develop healthy interpersonal relationships that lead to the alleviation and help prevent the relapse of depression.  2) Learn and implement coping skills that result in a reduction of anxiety and worry, and improved functioning.   3) Combine skills learned in therapy into a new daily approach to managing ADHD.   Client Statement of Needs: requests assistance with piecing back together her life as it once was before she got depressed (burnt out).   Treatment Level: Weekly Individual Outpatient Psychotherapy.  Symptoms: autonomic hyperactivity (e.g., palpitations, shortness of breath, dry mouth, trouble swallowing, nausea, diarrhea).  Childhood history of Attention Deficit Disorder (ADD) that was either diagnosed or later concluded due to the symptoms of behavioral problems at school, impulsivity, temper outbursts, and lack of concentration.  Depressed or irritable mood. Diminished interest in or enjoyment of activities. Easily distracted and drawn from task at hand.  Excessive and/or unrealistic worry that is difficult to control occurring more days than not for at least 6 months about a number of events or activities.  Feelings of hopelessness, worthlessness, or inappropriate guilt.  Lack of energy.   Poor  concentration and indecisiveness.  Restless and fidgety; unable to be sedentary for more than a short time.   Client Treatment Preferences: Continue with current therapist   Healthcare consumer's goal for treatment:  Psychologist, Olivia [name], Ph.D. will support the patient's ability to achieve the goals identified. Cognitive Behavioral Therapy, Dialectical Behavioral Therapy, Motivational Interviewing, SPACE, parent training, and other  evidenced-based practices will be used to promote progress towards healthy functioning.   Healthcare consumer Olivia [name] will: Actively participate in therapy, working towards healthy functioning.    *Justification for Continuation/Discontinuation of Goal: R=Revised, O=Ongoing, A=Achieved, D=Discontinued  Goal 1) Develop healthy interpersonal relationships that lead to the alleviation and help prevent the relapse of depression.  5 Point Likert rating baseline date: 05/28/2021 Target Date Goal Was reviewed Status Code Progress towards goal/Likert rating  05/29/2023 05/28/2022         O 3/5 - learning to set better limits and boundaries, still hasn't been able to regulate her daily routines             Goal 2) Learn and implement coping skills that result in a reduction of anxiety and worry, and improved daily functioning.  5 Point Likert rating baseline date: 05/28/2021  Target Date Goal Was reviewed Status Code Progress towards goal/Likert rating  05/29/2023 05/28/2022          O 2/5 - learning to use her skills more consistently             Goal 3) Combine skills learned in therapy into a new daily approach to managing ADHD.  5 Point Likert rating baseline date: 05/28/2021  Target Date Goal Was reviewed Status Code Progress towards goal/Likert rating  05/29/2023 05/28/2022          O 2/5 - learning to use her skills more consistently             This plan has been reviewed and created by the following participants:  This plan will be reviewed at least every 12 months. Date Behavioral Health Clinician Date Guardian/Patient   05/28/2022 Olivia [name], Ph.D.   05/28/2022 Olivia Kid" Dannika Hilgeman reports that she was still very sick.  Whatever she had went to her chest and became bronchitis.  She is trying to not push herself and take care of herself.  We d/ how she has been managing being away from work w/o too much anxiety.  Olivia [name] felt both relieved  and guilty that she wasn't able to go to her colleague's funeral.  This prompted a d/e of her avoidance of funerals and I made some connections between the present and the past which she found insightful.   Home Practice: journal on weekly prompts   Olivia Crochet, PhD

## 2022-11-27 ENCOUNTER — Other Ambulatory Visit (HOSPITAL_COMMUNITY): Payer: Self-pay

## 2022-11-27 ENCOUNTER — Encounter: Payer: Self-pay | Admitting: Family Medicine

## 2022-11-27 MED ORDER — FLUTICASONE PROPIONATE HFA 110 MCG/ACT IN AERO
2.0000 | INHALATION_SPRAY | Freq: Two times a day (BID) | RESPIRATORY_TRACT | 0 refills | Status: DC
  Filled 2022-11-27: qty 12, 30d supply, fill #0

## 2022-12-02 ENCOUNTER — Encounter: Payer: Self-pay | Admitting: Internal Medicine

## 2022-12-02 ENCOUNTER — Other Ambulatory Visit (HOSPITAL_BASED_OUTPATIENT_CLINIC_OR_DEPARTMENT_OTHER): Payer: Self-pay

## 2022-12-02 ENCOUNTER — Ambulatory Visit: Payer: Commercial Managed Care - PPO | Admitting: Internal Medicine

## 2022-12-02 ENCOUNTER — Ambulatory Visit: Payer: Commercial Managed Care - PPO | Admitting: Psychology

## 2022-12-02 VITALS — BP 125/85 | HR 114 | Temp 97.9°F | Ht 68.0 in | Wt 338.6 lb

## 2022-12-02 DIAGNOSIS — R0781 Pleurodynia: Secondary | ICD-10-CM | POA: Diagnosis not present

## 2022-12-02 DIAGNOSIS — I201 Angina pectoris with documented spasm: Secondary | ICD-10-CM | POA: Diagnosis not present

## 2022-12-02 MED ORDER — PREDNISONE 20 MG PO TABS
ORAL_TABLET | ORAL | 0 refills | Status: DC
  Filled 2022-12-02: qty 15, 5d supply, fill #0

## 2022-12-02 NOTE — Progress Notes (Signed)
[name]: [phone]   Routine Medical Office Visit  Patient:  Olivia [name]      Age: 57 y.o.       Sex:  female  Date:   12/02/2022  PCP:    Olivia [name], [place] Provider: Loralee Pacas, MD   Problem Focused Charting:   Medical Decision Making per Assessment/Plan   Olivia [name] was seen today for bronchitis and cough.  Pleuritic chest pain -     CBC; Future -     Comprehensive metabolic panel; Future -     Troponin I - -     D-dimer, quantitative -     EKG 12-Lead   The 10-year ASCVD risk score (Arnett DK, et al., 2019) is: 1.7%   Values used to calculate the score:     Age: 35 years     Sex: Female     Is Non-Hispanic African American: No     Diabetic: No     Tobacco smoker: No     Systolic Blood Pressure: 536 mmHg     Is BP treated: No     HDL Cholesterol: 62.3 mg/dL     Total Cholesterol: 225 mg/dL   We used shared decision making with her and her husband to decide on the workup plan.  She understands that the most important thing to rule out is pulmonary embolism given her Wells score of 4 but was hoping to avoid going to the emergency room as she also understands that this is most likely musculoskeletal strain from the frequent coughing recently and likely no significant PE will be found some types of pericarditis can cause pleuritic chest pain and her husband wants some cardiac data so we did EKG and enzymes but this was considered much lower risk based on her description of the pain as pleuritic although it was precordial in location.  She understands that doing this chest pain as an outpatient workup entails risks of delay in treatment(s) and agrees to those risks.  Patient reports she will go to ER if she feels any worse at all.  Given the nature of her pain and her high knowledge level I think this is appropriate.  She mainly wanted D-dimer check to guide plan and clearance to take more prednisone(gave both and agree  on medical necessity and appropriateness)  Today's key discussion points and After Visit Summary (AVS) reminders. Problem-associated medical records were reviewed with her during the appointment She was encouraged to contact our office by phone or message via [place] if she has any questions or concerns regarding our treatment plan (see AVS). She was given an opportunity to ask questions/clarifications about any aspect of the diagnosis and treatment plan at today's visit.  Common side effects, risks, benefits, and alternatives for medications and treatment plan prescribed today were discussed We discussed red flag symptoms and signs in detail and when to call the office or go to ER if her condition worsens (see AVS). She expressed understanding and AVS is used to reinforce.  Review of this encounter document for accuracy and understanding is encouraged in the AVS. A work excuse was provided (see AVS) Follow up recommended:  Return in about 1 week (around 12/09/2022) for f/u chest pain Olivia [name].    Subjective - Clinical Presentation:   Olivia [name] is a 57 y.o. female  Patient Active Problem List   Diagnosis Date Noted   History of total thyroidectomy 01/17/2021   History  of parathyroid surgery 2021 01/17/2021   Post-surgical hypothyroidism 01/17/2021   Hyperparathyroidism, primary ([place]) 05/22/2020   Ventricular bigeminy 11/17/2018   Major depression, recurrent, chronic ([place]) 11/17/2018   Atopic dermatitis 08/19/2018   Elevated coronary artery calcium score, 90th percentile for age and gender 08/16/2018   Vitamin D deficiency 01/05/2018   Psoriasis 01/05/2018   Chronic low back pain with sciatica 01/05/2018   IUD (intrauterine device) in place, [place] 12/07/2017   PCOS (polycystic ovarian syndrome) 10/25/2014   Migraine with aura 01/11/2014   History of laparoscopic adjustable gastric banding 05/08/2012   Mixed hyperlipidemia 04/15/2008   Morbid obesity ([place]) 04/15/2008   Allergic  rhinitis 04/15/2008   Allergic asthma 04/15/2008   Past Medical History:  Diagnosis Date   Anxiety    Asthma    Environmental allergies    Hiatal hernia    Hiatal hernia without gangrene or obstruction 08/19/2018   History of parathyroid surgery 2021 01/17/2021   For primary hyperparathyroidism. Dr. Harlow Asa   HLD (hyperlipidemia) 04/15/2008   Hypertension    Microcytic anemia    Migraine, hormonal    Multiple thyroid nodules 08/19/2018   2016 IMPRESSION: Multinodular goiter with multiple bilateral thyroid nodules. These are generally round and hypoechoic. There is a mildly complex cyst in the right lobe measuring up to 1.1 cm. The largest solid nodule is in the inferior right lobe measures 1.8 x 1.1 x 1.4 cm. This could either be sampled or followed by ultrasound.   PAC (premature atrial contraction)    PACs (premature atrial contraction)    Palpitations    PCOS (polycystic ovarian syndrome) 10/25/2014   PONV (postoperative nausea and vomiting)    Pre-diabetes    Reflux    Seasonal allergies     Outpatient Medications Prior to Visit  Medication Sig   albuterol (PROVENTIL HFA) 108 (90 Base) MCG/ACT inhaler Inhale 2 puffs into the lungs every 6 (six) hours as needed for wheezing or shortness of breath.   albuterol (PROVENTIL) (2.5 MG/3ML) 0.083% nebulizer solution Take 3 mLs (2.5 mg total) by nebulization every 6 (six) hours as needed for wheezing or shortness of breath.   ALPRAZolam (XANAX) 0.5 MG tablet Take 1 tablet (0.5 mg total) by mouth 2 (two) times daily as needed for anxiety.   buPROPion (WELLBUTRIN XL) 150 MG 24 hr tablet Take 1 tablet (150 mg total) by mouth daily.   cetirizine (ZYRTEC) 10 MG tablet Take 10 mg by mouth daily as needed for allergies.    diclofenac sodium (VOLTAREN) 1 % GEL Apply topically to affected area qid (Patient taking differently: Apply 1 application  topically 4 (four) times daily as needed (pain).)   escitalopram (LEXAPRO) 20 MG tablet Take 1 tablet  (20 mg total) by mouth daily.   famotidine (PEPCID) 20 MG tablet Take 20 mg by mouth daily as needed for heartburn or indigestion.   ferrous sulfate 325 (65 FE) MG EC tablet Take 325 mg by mouth daily with breakfast.   fluticasone (FLOVENT HFA) 110 MCG/ACT inhaler Inhale 2 puffs into the lungs in the morning and at bedtime.   guaiFENesin-codeine 100-10 MG/5ML syrup Take 5 mLs by mouth every 6 (six) hours as needed for cough.   influenza vac split quadrivalent PF (FLUARIX) 0.5 ML injection Inject into the muscle.   levothyroxine (SYNTHROID) 112 MCG tablet Take 1 tablet (112 mcg total) by mouth daily.   liothyronine (CYTOMEL) 25 MCG tablet Take 1 tablet by mouth once daily.   magnesium 30 MG tablet  Take 30 mg by mouth daily.   metoprolol tartrate (LOPRESSOR) 25 MG tablet Take 12.5 mg by mouth daily as needed.   Multiple Vitamin (MULITIVITAMIN WITH MINERALS) TABS Take 1 tablet by mouth daily.   mupirocin cream (BACTROBAN) 2 % Apply 1 application topically 2 (two) times daily.   nitroGLYCERIN (NITRODUR - DOSED IN MG/24 HR) 0.2 mg/hr patch Apply 1/4 patch daily to tendon for tendonitis.   Omega-3 Fatty Acids (FISH OIL) 1000 MG CAPS Take 1,000 mg by mouth daily.   SYNTHROID 112 MCG tablet 1 tablet in the morning on an empty stomach Orally Once a day 90 days   SYNTHROID 125 MCG tablet Take 1 tablet (125 mcg total) by mouth daily in the morning on an empty stomach.   triamcinolone cream (KENALOG) 0.1 % Apply as directed to affected area twice a day for 10-14 days as needed for flares   Turmeric (QC TUMERIC COMPLEX PO) Take 1 capsule by mouth daily.   Vitamin D, Cholecalciferol, 50 MCG (2000 UT) CAPS Take 2,000 Units by mouth daily.   azithromycin (ZITHROMAX) 250 MG tablet Take 2 tabs today, then 1 tablet daily for 4 days (Patient not taking: Reported on 12/02/2022)   predniSONE (DELTASONE) 20 MG tablet Take 3 tablets daily for 5 days (Patient not taking: Reported on 12/02/2022)   rosuvastatin (CRESTOR) 10  MG tablet TAKE 1 TABLET (10 MG TOTAL) BY MOUTH DAILY. (Patient not taking: Reported on 11/06/2021)   No facility-administered medications prior to visit.    Chief Complaint  Patient presents with   Bronchitis    Not improving, coughing, hurts chest to breathe, cough-very sharp. Requests d dimer.   Cough    Productive-clear mucus.    HPI   Asthmatic bronchitis early January with extensive coughing despite being treated with Z-pack and prednisone by Olivia [name].  Started getting severe left sided chest pain that is reproducible with coughing,  gets pleuritic pain just under midleft breast with coughing and deep breathing that has gotten really intense.   Oxygen saturation.  Heart rate 114 here but not at home.  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  There is no diaphoresis and ventilation seems unlabored.  Wt Readings from Last 10 Encounters:  12/02/22 (!) 338 lb 9.6 oz (153.6 kg)  11/19/22 (!) 336 lb 12.8 oz (152.8 kg)  11/06/21 (!) 369 lb 12.8 oz (167.7 kg)  05/04/21 (!) 352 lb (159.7 kg)  04/25/21 (!) 351 lb 3.2 oz (159.3 kg)  03/14/21 (!) 353 lb (160.1 kg)  02/05/21 (!) 348 lb (157.9 kg)  01/17/21 (!) 349 lb 9.6 oz (158.6 kg)  08/18/20 (!) 340 lb 3.2 oz (154.3 kg)  07/05/20 (!) 341 lb 3.2 oz (154.8 kg)   Vital signs reviewed.  Nursing notes reviewed. Weight trend reviewed. General Appearance:  Well developed, well nourished female in no acute distress.   Normal work of breathing at rest Musculoskeletal: All  extremities are intact. .cv   Neurological:  Awake, alert,  No obvious focal neurological deficits or cognitive impairments Psychiatric:  Appropriate mood, pleasant demeanor Problem-specific findings:  no calf tenderness, coughs not seen.  Anxious but not able to calm self.   .cvdris  Results Reviewed: No results found for any visits on 12/02/22.  Recent Results (from the past 2160 hour(s))  POC COVID-19     Status: None   Collection Time: 11/19/22 12:06 PM  Result Value Ref Range   SARS Coronavirus 2 Ag Negative Negative           EKG: sinus rhythm with rate 98, normal axis, normal intervals, no hypertrophy, no st or t wave changes      Signed: Loralee Pacas, MD 12/02/2022 5:20 PM

## 2022-12-03 ENCOUNTER — Ambulatory Visit: Payer: Commercial Managed Care - PPO | Admitting: Family Medicine

## 2022-12-03 ENCOUNTER — Encounter: Payer: Self-pay | Admitting: Internal Medicine

## 2022-12-03 LAB — COMPREHENSIVE METABOLIC PANEL
ALT: 20 U/L (ref 0–35)
AST: 23 U/L (ref 0–37)
Albumin: 4.3 g/dL (ref 3.5–5.2)
Alkaline Phosphatase: 61 U/L (ref 39–117)
BUN: 9 mg/dL (ref 6–23)
CO2: 25 mEq/L (ref 19–32)
Calcium: 9.5 mg/dL (ref 8.4–10.5)
Chloride: 104 mEq/L (ref 96–112)
Creatinine, Ser: 0.87 mg/dL (ref 0.40–1.20)
GFR: 75.36 mL/min (ref >60.00)
Glucose, Bld: 86 mg/dL (ref 70–99)
Potassium: 3.9 mEq/L (ref 3.5–5.1)
Sodium: 137 mEq/L (ref 135–145)
Total Bilirubin: 0.8 mg/dL (ref 0.2–1.2)
Total Protein: 7.6 g/dL (ref 6.0–8.3)

## 2022-12-03 LAB — CBC
HCT: 46.8 % — ABNORMAL HIGH (ref 36.0–46.0)
Hemoglobin: 15.6 g/dL — ABNORMAL HIGH (ref 12.0–15.0)
MCHC: 33.3 g/dL (ref 30.0–36.0)
MCV: 86.9 fl (ref 78.0–100.0)
Platelets: 308 10*3/uL (ref 150.0–400.0)
RBC: 5.38 Mil/uL — ABNORMAL HIGH (ref 3.87–5.11)
RDW: 14 % (ref 11.5–15.5)
WBC: 13.1 10*3/uL — ABNORMAL HIGH (ref 4.0–10.5)

## 2022-12-03 LAB — TROPONIN I

## 2022-12-03 LAB — D-DIMER, QUANTITATIVE: D-Dimer, Quant: 0.4 mcg/mL FEU (ref <0.50)

## 2022-12-04 ENCOUNTER — Ambulatory Visit (INDEPENDENT_AMBULATORY_CARE_PROVIDER_SITE_OTHER): Payer: 59 | Admitting: Psychology

## 2022-12-04 DIAGNOSIS — F324 Major depressive disorder, single episode, in partial remission: Secondary | ICD-10-CM

## 2022-12-04 LAB — TROPONIN I: Troponin I: <3 ng/L (ref <=47)

## 2022-12-04 NOTE — Progress Notes (Signed)
PROGRESS NOTE:   Name: Olivia [name] Date: 12/04/2022 MRN: [id number] DOB: November 07, 1965 PCP: Leamon Arnt, MD  Time spent: 4:00 - 4:55 PM  Annual Review: 05/29/2023  Today I met with  Olivia [name] in remote video (WebEx) face-to-face individual psychotherapy.  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 She is normally a high functioning and driven individual and it is a struggle to get motivated to do anything. If it is necessary for work she does it and does a good job, but it is not the same. She has gained 70 pounds in less than 9 months and it has impacted her self image. She is also peri menopausal and these hormonal changes are also likely to be contributing to her changing mood states.   A year and a half ago her father had quadrupel by-pass surgery and he was recently diagnosed with cancer. 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  Olivia [name] states that she was diagnosed with adult ADHD and responded well to medications.  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)   Son: Olivia [name] (77) he is on the ASD, he attends [employer]   Mental Status Exam: Appearance:   Casual     Behavior:  Appropriate  Motor:  Normal  Speech/Language:   NA  Affect:  Appropriate  Mood:  normal  Thought process:  normal  Thought content:    WNL  Sensory/Perceptual disturbances:    WNL  Orientation:  oriented to person, place, time/date, situation, and day of week  Attention:  Good  Concentration:  Good  Memory:  WNL  Fund of knowledge:   Good  Insight:    Good  Judgment:   Good  Impulse Control:  Good    Diagnoses:  Major depression single episode, in partial remission  ([place])   Individualized Treatment Plan                Strengths: bright, verbal, motivated, self aware, self advocate   Supports: spouse, family, friends, colleagues   Goal/Needs for Treatment:  In order of importance to patient  1) Develop healthy interpersonal relationships that lead to the alleviation and help prevent the relapse of depression.  2) Learn and implement coping skills that result in a reduction of anxiety and worry, and improved functioning.   3) Combine skills learned in therapy into a new daily approach to managing ADHD.   Client Statement of Needs: requests assistance with piecing back together her life as it once was before she got depressed (burnt out).   Treatment Level: Weekly Individual Outpatient Psychotherapy.  Symptoms: autonomic hyperactivity (e.g., palpitations, shortness of breath, dry mouth, trouble swallowing, nausea, diarrhea).  Childhood history of Attention Deficit Disorder (ADD) that was either diagnosed or later concluded due to the symptoms of behavioral problems at school, impulsivity, temper outbursts, and lack of concentration.  Depressed or irritable mood. Diminished interest in or enjoyment of activities. Easily distracted and drawn from task at hand.  Excessive and/or unrealistic worry that is difficult to control occurring more days than not for at least 6 months about a number of events or activities.  Feelings of hopelessness, worthlessness, or inappropriate guilt.  Lack of energy.   Poor concentration  and indecisiveness.  Restless and fidgety; unable to be sedentary for more than a short time.   Client Treatment Preferences: Continue with current therapist   Healthcare consumer's goal for treatment:  Psychologist, Olivia [name], Ph.D. will support the patient's ability to achieve the goals identified. Cognitive Behavioral Therapy, Dialectical Behavioral Therapy, Motivational Interviewing, SPACE, parent training, and other  evidenced-based practices will be used to promote progress towards healthy functioning.   Healthcare consumer Olivia [name] will: Actively participate in therapy, working towards healthy functioning.    *Justification for Continuation/Discontinuation of Goal: R=Revised, O=Ongoing, A=Achieved, D=Discontinued  Goal 1) Develop healthy interpersonal relationships that lead to the alleviation and help prevent the relapse of depression.  5 Point Likert rating baseline date: 05/28/2021 Target Date Goal Was reviewed Status Code Progress towards goal/Likert rating  05/29/2023 05/28/2022         O 3/5 - learning to set better limits and boundaries, still hasn't been able to regulate her daily routines             Goal 2) Learn and implement coping skills that result in a reduction of anxiety and worry, and improved daily functioning.  5 Point Likert rating baseline date: 05/28/2021  Target Date Goal Was reviewed Status Code Progress towards goal/Likert rating  05/29/2023 05/28/2022          O 2/5 - learning to use her skills more consistently             Goal 3) Combine skills learned in therapy into a new daily approach to managing ADHD.  5 Point Likert rating baseline date: 05/28/2021  Target Date Goal Was reviewed Status Code Progress towards goal/Likert rating  05/29/2023 05/28/2022          O 2/5 - learning to use her skills more consistently             This plan has been reviewed and created by the following participants:  This plan will be reviewed at least every 12 months. Date Behavioral Health Clinician Date Guardian/Patient   05/28/2022 Olivia [name], Ph.D.   05/28/2022 Olivia [name]" Olivia [name] reports that she saw her PCP on Monday.  She shared that she had some bad chest pain over the weekend and this scared her husband.  We d/ that there have been a number of medical crisis in the family and this was a contributing factor.  We D/p that she had a  conversation with her father and he too has been very worried about her and had a lot of questions about what has been happening these past couple of years.  She is getting better but they are still going to do a CT Scan at the end of the week.    Home Practice: journal on weekly prompts   Olivia Crochet, PhD

## 2022-12-05 ENCOUNTER — Other Ambulatory Visit (HOSPITAL_COMMUNITY): Payer: Self-pay

## 2022-12-05 DIAGNOSIS — R1032 Left lower quadrant pain: Secondary | ICD-10-CM | POA: Diagnosis not present

## 2022-12-05 DIAGNOSIS — Z800 Family history of malignant neoplasm of digestive organs: Secondary | ICD-10-CM | POA: Diagnosis not present

## 2022-12-05 DIAGNOSIS — Z86010 Personal history of colonic polyps: Secondary | ICD-10-CM | POA: Diagnosis not present

## 2022-12-05 DIAGNOSIS — R635 Abnormal weight gain: Secondary | ICD-10-CM | POA: Diagnosis not present

## 2022-12-05 DIAGNOSIS — Z1211 Encounter for screening for malignant neoplasm of colon: Secondary | ICD-10-CM | POA: Diagnosis not present

## 2022-12-09 ENCOUNTER — Ambulatory Visit (INDEPENDENT_AMBULATORY_CARE_PROVIDER_SITE_OTHER): Payer: 59 | Admitting: Psychology

## 2022-12-09 DIAGNOSIS — F324 Major depressive disorder, single episode, in partial remission: Secondary | ICD-10-CM | POA: Diagnosis not present

## 2022-12-09 NOTE — Progress Notes (Signed)
PROGRESS NOTE:   Name: Olivia [name] Date: 12/09/2022 MRN: [id number] DOB: December 09, 1965 PCP: Leamon Arnt, MD  Time spent: 4:00 - 4:55 PM  Annual Review: 05/29/2023  Today I met with  Olivia [name] in remote video (WebEx) face-to-face individual psychotherapy.  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 She is normally a high functioning and driven individual and it is a struggle to get motivated to do anything. If it is necessary for work she does it and does a good job, but it is not the same. She has gained 70 pounds in less than 9 months and it has impacted her self image. She is also peri menopausal and these hormonal changes are also likely to be contributing to her changing mood [name].   A year and a half ago her father had quadrupel by-pass surgery and he was recently diagnosed with cancer. 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  Olivia [name] that she was diagnosed with adult ADHD and responded well to medications.  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)   Son: Olivia [name] (84) he is on the ASD, he attends [employer]   Mental Status Exam: Appearance:   Casual     Behavior:  Appropriate  Motor:  Normal  Speech/Language:   NA  Affect:  Appropriate  Mood:  normal  Thought process:  normal  Thought content:    WNL  Sensory/Perceptual disturbances:    WNL  Orientation:  oriented to person, place, time/date, situation, and day of week  Attention:  Good  Concentration:  Good  Memory:  WNL  Fund of knowledge:   Good  Insight:    Good  Judgment:   Good  Impulse Control:  Good    Diagnoses:  Major depression single episode, in partial remission  (Olivia [name])   Individualized Treatment Plan                Strengths: bright, verbal, motivated, self aware, self advocate   Supports: spouse, family, friends, colleagues   Goal/Needs for Treatment:  In order of importance to patient  1) Develop healthy interpersonal relationships that lead to the alleviation and help prevent the relapse of depression.  2) Learn and implement coping skills that result in a reduction of anxiety and worry, and improved functioning.   3) Combine skills learned in therapy into a new daily approach to managing ADHD.   Client Statement of Needs: requests assistance with piecing back together her life as it once was before she got depressed (burnt out).   Treatment Level: Weekly Individual Outpatient Psychotherapy.  Symptoms: autonomic hyperactivity (e.g., palpitations, shortness of breath, dry mouth, trouble swallowing, nausea, diarrhea).  Childhood history of Attention Deficit Disorder (ADD) that was either diagnosed or later concluded due to the symptoms of behavioral problems at school, impulsivity, temper outbursts, and lack of concentration.  Depressed or irritable mood. Diminished interest in or enjoyment of activities. Easily distracted and drawn from task at hand.  Excessive and/or unrealistic worry that is difficult to control occurring more days than not for at least 6 months about a number of events or activities.  Feelings of hopelessness, worthlessness, or inappropriate guilt.  Lack of energy.   Poor  concentration and indecisiveness.  Restless and fidgety; unable to be sedentary for more than a short time.   Client Treatment Preferences: Continue with current therapist   Healthcare consumer's goal for treatment:  Psychologist, Olivia [name], Ph.D. will support the patient's ability to achieve the goals identified. Cognitive Behavioral Therapy, Dialectical Behavioral Therapy, Motivational Interviewing, SPACE, parent training, and other  evidenced-based practices will be used to promote progress towards healthy functioning.   Healthcare consumer Olivia [name] will: Actively participate in therapy, working towards healthy functioning.    *Justification for Continuation/Discontinuation of Goal: R=Revised, O=Ongoing, A=Achieved, D=Discontinued  Goal 1) Develop healthy interpersonal relationships that lead to the alleviation and help prevent the relapse of depression.  5 Point Likert rating baseline date: 05/28/2021 Target Date Goal Was reviewed Status Code Progress towards goal/Likert rating  05/29/2023 05/28/2022         O 3/5 - learning to set better limits and boundaries, still hasn't been able to regulate her daily routines             Goal 2) Learn and implement coping skills that result in a reduction of anxiety and worry, and improved daily functioning.  5 Point Likert rating baseline date: 05/28/2021  Target Date Goal Was reviewed Status Code Progress towards goal/Likert rating  05/29/2023 05/28/2022          O 2/5 - learning to use her skills more consistently             Goal 3) Combine skills learned in therapy into a new daily approach to managing ADHD.  5 Point Likert rating baseline date: 05/28/2021  Target Date Goal Was reviewed Status Code Progress towards goal/Likert rating  05/29/2023 05/28/2022          O 2/5 - learning to use her skills more consistently             This plan has been reviewed and created by the following participants:  This plan will be reviewed at least every 12 months. Date Behavioral Health Clinician Date Guardian/Patient   05/28/2022 Olivia [name], Ph.D.   05/28/2022 Olivia [name] reports that she felt really good one day last week.  She ran around and get a lot of things done and predictably she crashed around 5.  We d/ that it is difficult to move away from habitual behaviors.  We were able to talk about the ways that her behaviors have  changed, particularly showing more self compassion when she is unable to complete a task.  Olivia [name] shared that her son was very upset by the news of [employer] players being accused of sexual assault.  He asked many questions which she answered but it also triggered some PTSD like symptoms.  I identified what was occurring.  I then reviewed a number of skills she could use to ground and interrupt the intrusive thoughts and memories.   Home Practice: journal on weekly prompts   Olivia Crochet, PhD  Friend - Amy

## 2022-12-16 ENCOUNTER — Ambulatory Visit (INDEPENDENT_AMBULATORY_CARE_PROVIDER_SITE_OTHER): Payer: 59 | Admitting: Psychology

## 2022-12-16 DIAGNOSIS — F411 Generalized anxiety disorder: Secondary | ICD-10-CM

## 2022-12-16 DIAGNOSIS — F324 Major depressive disorder, single episode, in partial remission: Secondary | ICD-10-CM | POA: Diagnosis not present

## 2022-12-16 NOTE — Progress Notes (Signed)
PROGRESS NOTE:   Name: Olivia [name] Date: 12/16/2022 MRN: [id number] DOB: [birth year] PCP: Leamon Arnt, MD  Time spent: 4:00 - 4:55 PM  Annual Review: 05/29/2023  Today I met with  Olivia [name] in remote video (WebEx) face-to-face individual psychotherapy.  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 She is normally a high functioning and driven individual and it is a struggle to get motivated to do anything. If it is necessary for work she does it and does a good job, but it is not the same. She has gained 70 pounds in less than 9 months and it has impacted her self image. She is also peri menopausal and these hormonal changes are also likely to be contributing to her changing mood states.   A year and a half ago her father had quadrupel by-pass surgery and he was recently diagnosed with cancer. 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  [name] states that she was diagnosed with adult ADHD and responded well to medications.  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   Individualized Treatment  Plan                Strengths: bright, verbal, motivated, self aware, self advocate   Supports: spouse, family, friends, colleagues   Goal/Needs for Treatment:  In order of importance to patient  1) Develop healthy interpersonal relationships that lead to the alleviation and help prevent the relapse of depression.  2) Learn and implement coping skills that result in a reduction of anxiety and worry, and improved functioning.   3) Combine skills learned in therapy into a new daily approach to managing ADHD.   Client Statement of Needs: requests assistance with piecing back together her life as it once was before she got depressed (burnt out).   Treatment Level: Weekly Individual Outpatient Psychotherapy.  Symptoms: autonomic hyperactivity (e.g., palpitations, shortness of breath, dry mouth, trouble swallowing, nausea, diarrhea).  Childhood history of Attention Deficit Disorder (ADD) that was either diagnosed or later concluded due to the symptoms of behavioral problems at school, impulsivity, temper outbursts, and lack of concentration.  Depressed or irritable mood. Diminished interest in or enjoyment of activities. Easily distracted and drawn from task at hand.  Excessive and/or unrealistic worry that is difficult to control occurring more days than not for at least 6 months about a number of events or activities.  Feelings of hopelessness, worthlessness, or inappropriate guilt.  Lack of energy.   Poor concentration and indecisiveness.  Restless  and fidgety; unable to be sedentary for more than a short time.   Client Treatment Preferences: Continue with current therapist   Healthcare consumer's goal for treatment:  Psychologist, [name], Ph.D. will support the patient's ability to achieve the goals identified. Cognitive Behavioral Therapy, Dialectical Behavioral Therapy, Motivational Interviewing, SPACE, parent training, and other evidenced-based practices will be used to  promote progress towards healthy functioning.   Healthcare consumer Lane "Olivia [name]" Cirrincione will: Actively participate in therapy, working towards healthy functioning.    *Justification for Continuation/Discontinuation of Goal: R=Revised, O=Ongoing, A=Achieved, D=Discontinued  Goal 1) Develop healthy interpersonal relationships that lead to the alleviation and help prevent the relapse of depression.  5 Point Likert rating baseline date: 05/28/2021 Target Date Goal Was reviewed Status Code Progress towards goal/Likert rating  05/29/2023 05/28/2022         O 3/5 - learning to set better limits and boundaries, still hasn't been able to regulate her daily routines             Goal 2) Learn and implement coping skills that result in a reduction of anxiety and worry, and improved daily functioning.  5 Point Likert rating baseline date: 05/28/2021  Target Date Goal Was reviewed Status Code Progress towards goal/Likert rating  05/29/2023 05/28/2022          O 2/5 - learning to use her skills more consistently             Goal 3) Combine skills learned in therapy into a new daily approach to managing ADHD.  5 Point Likert rating baseline date: 05/28/2021  Target Date Goal Was reviewed Status Code Progress towards goal/Likert rating  05/29/2023 05/28/2022          O 2/5 - learning to use her skills more consistently             This plan has been reviewed and created by the following participants:  This plan will be reviewed at least every 12 months. Date Behavioral Health Clinician Date Guardian/Patient   05/28/2022 [name], Ph.D.   05/28/2022 [name]" Traeh Monds reports that she had a "panic attack" today.  But she admitted that she was nervous telling me about it before our session.  We reviewed what occurred, helped her p/ some painful past experiences related her work and addressed her feelings of (irrational) guilt.   I provided the safe environment she  needed to be able to d/ what was triggering her panic and all the mixed she was experiencing.  I encouraged her to be more protective of exposing herself to these triggers and to do some daily centering practices.   Home Practice: journal on weekly prompts   [name], PhD  Friend - Amy

## 2022-12-23 ENCOUNTER — Ambulatory Visit (INDEPENDENT_AMBULATORY_CARE_PROVIDER_SITE_OTHER): Payer: 59 | Admitting: Psychology

## 2022-12-23 DIAGNOSIS — F411 Generalized anxiety disorder: Secondary | ICD-10-CM | POA: Diagnosis not present

## 2022-12-23 NOTE — Progress Notes (Signed)
PROGRESS NOTE:   Name: Olivia [name] Date: 12/23/2022 MRN: [id number] DOB: [birth year] PCP: Leamon Arnt, MD  Time spent: 4:00 - 4:55 PM  Annual Review: 05/29/2023  Today I met with  [name] in remote video (WebEx) face-to-face individual psychotherapy.  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 She is normally a high functioning and driven individual and it is a struggle to get motivated to do anything. If it is necessary for work she does it and does a good job, but it is not the same. She has gained 70 pounds in less than 9 months and it has impacted her self image. She is also peri menopausal and these hormonal changes are also likely to be contributing to her changing mood states.   A year and a half ago her father had quadrupel by-pass surgery and he was recently diagnosed with cancer. 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  Jajaira states that she was diagnosed with adult ADHD and responded well to medications.  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   Individualized Treatment  Plan                Strengths: bright, verbal, motivated, self aware, self advocate   Supports: spouse, family, friends, colleagues   Goal/Needs for Treatment:  In order of importance to patient  1) Develop healthy interpersonal relationships that lead to the alleviation and help prevent the relapse of depression.  2) Learn and implement coping skills that result in a reduction of anxiety and worry, and improved functioning.   3) Combine skills learned in therapy into a new daily approach to managing ADHD.   Client Statement of Needs: requests assistance with piecing back together her life as it once was before she got depressed (burnt out).   Treatment Level: Weekly Individual Outpatient Psychotherapy.  Symptoms: autonomic hyperactivity (e.g., palpitations, shortness of breath, dry mouth, trouble swallowing, nausea, diarrhea).  Childhood history of Attention Deficit Disorder (ADD) that was either diagnosed or later concluded due to the symptoms of behavioral problems at school, impulsivity, temper outbursts, and lack of concentration.  Depressed or irritable mood. Diminished interest in or enjoyment of activities. Easily distracted and drawn from task at hand.  Excessive and/or unrealistic worry that is difficult to control occurring more days than not for at least 6 months about a number of events or activities.  Feelings of hopelessness, worthlessness, or inappropriate guilt.  Lack of energy.   Poor concentration and indecisiveness.  Restless  and fidgety; unable to be sedentary for more than a short time.   Client Treatment Preferences: Continue with current therapist   Healthcare consumer's goal for treatment:  Psychologist, [name], Ph.D. will support the patient's ability to achieve the goals identified. Cognitive Behavioral Therapy, Dialectical Behavioral Therapy, Motivational Interviewing, SPACE, parent training, and other evidenced-based practices will be used to  promote progress towards healthy functioning.   Healthcare consumer Aslynn "[name]" Koffel will: Actively participate in therapy, working towards healthy functioning.    *Justification for Continuation/Discontinuation of Goal: R=Revised, O=Ongoing, A=Achieved, D=Discontinued  Goal 1) Develop healthy interpersonal relationships that lead to the alleviation and help prevent the relapse of depression.  5 Point Likert rating baseline date: 05/28/2021 Target Date Goal Was reviewed Status Code Progress towards goal/Likert rating  05/29/2023 05/28/2022         O 3/5 - learning to set better limits and boundaries, still hasn't been able to regulate her daily routines             Goal 2) Learn and implement coping skills that result in a reduction of anxiety and worry, and improved daily functioning.  5 Point Likert rating baseline date: 05/28/2021  Target Date Goal Was reviewed Status Code Progress towards goal/Likert rating  05/29/2023 05/28/2022          O 2/5 - learning to use her skills more consistently             Goal 3) Combine skills learned in therapy into a new daily approach to managing ADHD.  5 Point Likert rating baseline date: 05/28/2021  Target Date Goal Was reviewed Status Code Progress towards goal/Likert rating  05/29/2023 05/28/2022          O 2/5 - learning to use her skills more consistently             This plan has been reviewed and created by the following participants:  This plan will be reviewed at least every 12 months. Date Behavioral Health Clinician Date Guardian/Patient   05/28/2022 [name], Ph.D.   05/28/2022 [name]" Linden Carolla reports that she felt so much lighter after "unloading" in our last session.  We d/e/p what she reflected on since last week.  She f/t with my suggestions and was much calmer throughout the week.  She was anxious about an upcoming meeting.  We d/ how to manage her anxiety and p/s how to get through the  meeting.       Home Practice: journal on weekly prompts   [name], PhD  Friend - Amy

## 2022-12-30 ENCOUNTER — Ambulatory Visit (INDEPENDENT_AMBULATORY_CARE_PROVIDER_SITE_OTHER): Payer: 59 | Admitting: Psychology

## 2022-12-30 DIAGNOSIS — F411 Generalized anxiety disorder: Secondary | ICD-10-CM | POA: Diagnosis not present

## 2022-12-30 NOTE — Progress Notes (Signed)
PROGRESS NOTE:   Name: Olivia [name] Date: 12/30/2022 MRN: [id number] DOB: 12-Jan-1966 PCP: Leamon Arnt, MD  Time spent: 4:00 - 4:55 PM  Annual Review: 05/29/2023  Today I met with  Olivia [name] in remote video (WebEx) face-to-face individual psychotherapy.  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 She is normally a high functioning and driven individual and it is a struggle to get motivated to do anything. If it is necessary for work she does it and does a good job, but it is not the same. She has gained 70 pounds in less than 9 months and it has impacted her self image. She is also peri menopausal and these hormonal changes are also likely to be contributing to her changing mood [name].   A year and a half ago her father had quadrupel by-pass surgery and he was recently diagnosed with cancer. 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  Olivia [name] that she was diagnosed with adult ADHD and responded well to medications.  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)   Son: Olivia [name] (16) he is on the ASD, he attends [employer]   Mental Status Exam: Appearance:   Casual     Behavior:  Appropriate  Motor:  Normal  Speech/Language:   NA  Affect:  Appropriate  Mood:  normal  Thought process:  normal  Thought content:    WNL  Sensory/Perceptual disturbances:    WNL  Orientation:  oriented to person, place, time/date, situation, and day of week  Attention:  Good  Concentration:  Good  Memory:  WNL  Fund of knowledge:   Good  Insight:    Good  Judgment:   Good  Impulse Control:  Good    Diagnoses:  Generalized anxiety disorder   Individualized Treatment  Plan                Strengths: bright, verbal, motivated, self aware, self advocate   Supports: spouse, family, friends, colleagues   Goal/Needs for Treatment:  In order of importance to patient  1) Develop healthy interpersonal relationships that lead to the alleviation and help prevent the relapse of depression.  2) Learn and implement coping skills that result in a reduction of anxiety and worry, and improved functioning.   3) Combine skills learned in therapy into a new daily approach to managing ADHD.   Client Statement of Needs: requests assistance with piecing back together her life as it once was before she got depressed (burnt out).   Treatment Level: Weekly Individual Outpatient Psychotherapy.  Symptoms: autonomic hyperactivity (e.g., palpitations, shortness of breath, dry mouth, trouble swallowing, nausea, diarrhea).  Childhood history of Attention Deficit Disorder (ADD) that was either diagnosed or later concluded due to the symptoms of behavioral problems at school, impulsivity, temper outbursts, and lack of concentration.  Depressed or irritable mood. Diminished interest in or enjoyment of activities. Easily distracted and drawn from task at hand.  Excessive and/or unrealistic worry that is difficult to control occurring more days than not for at least 6 months about a number of events or activities.  Feelings of hopelessness, worthlessness, or inappropriate guilt.  Lack of energy.   Poor concentration and indecisiveness.  Restless and fidgety; unable to be sedentary for more than a short time.   Client Treatment Preferences: Continue with current therapist   Healthcare consumer's goal for treatment:  Psychologist, Olivia [name], Ph.D. will support the patient's ability to achieve the goals identified. Cognitive Behavioral Therapy, Dialectical Behavioral Therapy, Motivational Interviewing, SPACE, parent training, and other evidenced-based practices will be used to  promote progress towards healthy functioning.   Healthcare consumer Olivia [name] will: Actively participate in therapy, working towards healthy functioning.    *Justification for Continuation/Discontinuation of Goal: R=Revised, O=Ongoing, A=Achieved, D=Discontinued  Goal 1) Develop healthy interpersonal relationships that lead to the alleviation and help prevent the relapse of depression.  5 Point Likert rating baseline date: 05/28/2021 Target Date Goal Was reviewed Status Code Progress towards goal/Likert rating  05/29/2023 05/28/2022         O 3/5 - learning to set better limits and boundaries, still hasn't been able to regulate her daily routines             Goal 2) Learn and implement coping skills that result in a reduction of anxiety and worry, and improved daily functioning.  5 Point Likert rating baseline date: 05/28/2021  Target Date Goal Was reviewed Status Code Progress towards goal/Likert rating  05/29/2023 05/28/2022          O 2/5 - learning to use her skills more consistently             Goal 3) Combine skills learned in therapy into a new daily approach to managing ADHD.  5 Point Likert rating baseline date: 05/28/2021  Target Date Goal Was reviewed Status Code Progress towards goal/Likert rating  05/29/2023 05/28/2022          O 2/5 - learning to use her skills more consistently             This plan has been reviewed and created by the following participants:  This plan will be reviewed at least every 12 months. Date Behavioral Health Clinician Date Guardian/Patient   05/28/2022 Olivia [name], Ph.D.   05/28/2022 Olivia [name] reports that this weekend her father ended up in the hospital secondary to a reaction to chemo.  She went on to describe a series of events and visits to the hospital.  She noted that her husband is worried about her and how all the stress might effect her.  We d/e/p how she is managing all these  stressors, self care and asking for help.   Home Practice: journal on weekly prompts   Olivia Crochet, PhD  Friend - Amy

## 2023-01-06 ENCOUNTER — Ambulatory Visit (INDEPENDENT_AMBULATORY_CARE_PROVIDER_SITE_OTHER): Payer: 59 | Admitting: Psychology

## 2023-01-06 DIAGNOSIS — F411 Generalized anxiety disorder: Secondary | ICD-10-CM

## 2023-01-06 NOTE — Progress Notes (Signed)
PROGRESS NOTE:   Name: Olivia [name] Date: 01/06/2023 MRN: [id number] DOB: 09-09-1966 PCP: Leamon Arnt, MD  Time spent: 4:00 - 4:55 PM  Annual Review: 05/29/2023  Today I met with  Olivia [name] in remote video (WebEx) face-to-face individual psychotherapy.  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 She is normally a high functioning and driven individual and it is a struggle to get motivated to do anything. If it is necessary for work she does it and does a good job, but it is not the same. She has gained 70 pounds in less than 9 months and it has impacted her self image. She is also peri menopausal and these hormonal changes are also likely to be contributing to her changing mood states.   A year and a half ago her father had quadrupel by-pass surgery and he was recently diagnosed with cancer. 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  Olivia [name] states that she was diagnosed with adult ADHD and responded well to medications.  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)   Son: [name] (41) he is on the ASD, he attends [employer]   Mental Status Exam: Appearance:   Casual     Behavior:  Appropriate  Motor:  Normal  Speech/Language:   NA  Affect:  Appropriate  Mood:  normal  Thought process:  normal  Thought content:    WNL  Sensory/Perceptual disturbances:    WNL  Orientation:  oriented to person, place, time/date, situation, and day of week  Attention:  Good  Concentration:  Good  Memory:  WNL  Fund of knowledge:   Good  Insight:    Good  Judgment:   Good  Impulse Control:  Good    Diagnoses:  Generalized anxiety disorder   Individualized Treatment  Plan                Strengths: bright, verbal, motivated, self aware, self advocate   Supports: spouse, family, friends, colleagues   Goal/Needs for Treatment:  In order of importance to patient  1) Develop healthy interpersonal relationships that lead to the alleviation and help prevent the relapse of depression.  2) Learn and implement coping skills that result in a reduction of anxiety and worry, and improved functioning.   3) Combine skills learned in therapy into a new daily approach to managing ADHD.   Client Statement of Needs: requests assistance with piecing back together her life as it once was before she got depressed (burnt out).   Treatment Level: Weekly Individual Outpatient Psychotherapy.  Symptoms: autonomic hyperactivity (e.g., palpitations, shortness of breath, dry mouth, trouble swallowing, nausea, diarrhea).  Childhood history of Attention Deficit Disorder (ADD) that was either diagnosed or later concluded due to the symptoms of behavioral problems at school, impulsivity, temper outbursts, and lack of concentration.  Depressed or irritable mood. Diminished interest in or enjoyment of activities. Easily distracted and drawn from task at hand.  Excessive and/or unrealistic worry that is difficult to control occurring more days than not for at least 6 months about a number of events or activities.  Feelings of hopelessness, worthlessness, or inappropriate guilt.  Lack of energy.   Poor concentration and indecisiveness.  Restless  and fidgety; unable to be sedentary for more than a short time.   Client Treatment Preferences: Continue with current therapist   Healthcare consumer's goal for treatment:  Psychologist, Olivia [name], Ph.D. will support the patient's ability to achieve the goals identified. Cognitive Behavioral Therapy, Dialectical Behavioral Therapy, Motivational Interviewing, SPACE, parent training, and other evidenced-based practices will be used to  promote progress towards healthy functioning.   Healthcare consumer Olivia [name] will: Actively participate in therapy, working towards healthy functioning.    *Justification for Continuation/Discontinuation of Goal: R=Revised, O=Ongoing, A=Achieved, D=Discontinued  Goal 1) Develop healthy interpersonal relationships that lead to the alleviation and help prevent the relapse of depression.  5 Point Likert rating baseline date: 05/28/2021 Target Date Goal Was reviewed Status Code Progress towards goal/Likert rating  05/29/2023 05/28/2022         O 3/5 - learning to set better limits and boundaries, still hasn't been able to regulate her daily routines             Goal 2) Learn and implement coping skills that result in a reduction of anxiety and worry, and improved daily functioning.  5 Point Likert rating baseline date: 05/28/2021  Target Date Goal Was reviewed Status Code Progress towards goal/Likert rating  05/29/2023 05/28/2022          O 2/5 - learning to use her skills more consistently             Goal 3) Combine skills learned in therapy into a new daily approach to managing ADHD.  5 Point Likert rating baseline date: 05/28/2021  Target Date Goal Was reviewed Status Code Progress towards goal/Likert rating  05/29/2023 05/28/2022          O 2/5 - learning to use her skills more consistently             This plan has been reviewed and created by the following participants:  This plan will be reviewed at least every 12 months. Date Behavioral Health Clinician Date Guardian/Patient   05/28/2022 Olivia [name], Ph.D.   05/28/2022 Olivia [name] reports that her mother-in-law is not well and her husband will be gone for two weeks while she recovers from surgery.  She admits that she was feeling upset and she didn't know why.  We d/e/p what was triggering her uncomfortable feelings.  Once we identified what was upsetting her, we were able to  create an action plan she felt comfortable following.     Home Practice: journal on weekly prompts   Olivia Crochet, PhD  Friend - Amy

## 2023-01-13 ENCOUNTER — Ambulatory Visit (INDEPENDENT_AMBULATORY_CARE_PROVIDER_SITE_OTHER): Payer: 59 | Admitting: Psychology

## 2023-01-13 DIAGNOSIS — F411 Generalized anxiety disorder: Secondary | ICD-10-CM

## 2023-01-13 NOTE — Progress Notes (Addendum)
PROGRESS NOTE:   Name: Olivia [name] Date: 01/13/2023 MRN: [id number] DOB: [birth year] PCP: Leamon Arnt, MD  Time spent: 4:00 - 4:55 PM  Annual Review: 05/29/2023  Today I met with  Olivia [name] in remote video (WebEx) face-to-face individual psychotherapy.  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 She is normally a high functioning and driven individual and it is a struggle to get motivated to do anything. If it is necessary for work she does it and does a good job, but it is not the same. She has gained 70 pounds in less than 9 months and it has impacted her self image. She is also peri menopausal and these hormonal changes are also likely to be contributing to her changing mood states.   A year and a half ago her father had quadrupel by-pass surgery and he was recently diagnosed with cancer. 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  Olivia [name] states that she was diagnosed with adult ADHD and responded well to medications.  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)   Son: Olivia [name] (57) he is on the ASD, he is a Paramedic and attends [employer]   Mental Status Exam: Appearance:   Casual     Behavior:  Appropriate  Motor:  Normal  Speech/Language:   NA  Affect:  Appropriate  Mood:  normal  Thought process:  normal  Thought content:    WNL  Sensory/Perceptual disturbances:    WNL  Orientation:  oriented to person, place, time/date, situation, and day of week  Attention:  Good  Concentration:  Good  Memory:  WNL  Fund of knowledge:   Good  Insight:    Good  Judgment:   Good  Impulse Control:  Good    Diagnoses:  No diagnosis found.   Individualized Treatment  Plan                Strengths: bright, verbal, motivated, self aware, self advocate   Supports: spouse, family, friends, colleagues   Goal/Needs for Treatment:  In order of importance to patient  1) Develop healthy interpersonal relationships that lead to the alleviation and help prevent the relapse of depression.  2) Learn and implement coping skills that result in a reduction of anxiety and worry, and improved functioning.   3) Combine skills learned in therapy into a new daily approach to managing ADHD.   Client Statement of Needs: requests assistance with piecing back together her life as it once was before she got depressed (burnt out).   Treatment Level: Weekly Individual Outpatient Psychotherapy.  Symptoms: autonomic hyperactivity (e.g., palpitations, shortness of breath, dry mouth, trouble swallowing, nausea, diarrhea).  Childhood history of Attention Deficit Disorder (ADD) that was either diagnosed or later concluded due to the symptoms of behavioral problems at school, impulsivity, temper outbursts, and lack of concentration.  Depressed or irritable mood. Diminished interest in or enjoyment of activities. Easily distracted and drawn from task at hand.  Excessive and/or unrealistic worry that is difficult to control occurring more days than not for at least 6 months about a number of events or activities.  Feelings of hopelessness, worthlessness, or inappropriate guilt.  Lack of energy.   Poor concentration and  indecisiveness.  Restless and fidgety; unable to be sedentary for more than a short time.   Client Treatment Preferences: Continue with current therapist   Healthcare consumer's goal for treatment:  Psychologist, [name], Ph.D. will support the patient's ability to achieve the goals identified. Cognitive Behavioral Therapy, Dialectical Behavioral Therapy, Motivational Interviewing, SPACE, parent training, and other evidenced-based practices will be used to  promote progress towards healthy functioning.   Healthcare consumer Olivia [name] will: Actively participate in therapy, working towards healthy functioning.    *Justification for Continuation/Discontinuation of Goal: R=Revised, O=Ongoing, A=Achieved, D=Discontinued  Goal 1) Develop healthy interpersonal relationships that lead to the alleviation and help prevent the relapse of depression.  5 Point Likert rating baseline date: 05/28/2021 Target Date Goal Was reviewed Status Code Progress towards goal/Likert rating  05/29/2023 05/28/2022         O 3/5 - learning to set better limits and boundaries, still hasn't been able to regulate her daily routines             Goal 2) Learn and implement coping skills that result in a reduction of anxiety and worry, and improved daily functioning.  5 Point Likert rating baseline date: 05/28/2021  Target Date Goal Was reviewed Status Code Progress towards goal/Likert rating  05/29/2023 05/28/2022          O 2/5 - learning to use her skills more consistently             Goal 3) Combine skills learned in therapy into a new daily approach to managing ADHD.  5 Point Likert rating baseline date: 05/28/2021  Target Date Goal Was reviewed Status Code Progress towards goal/Likert rating  05/29/2023 05/28/2022          O 2/5 - learning to use her skills more consistently             This plan has been reviewed and created by the following participants:  This plan will be reviewed at least every 12 months. Date Behavioral Health Clinician Date Guardian/Patient   05/28/2022 [name], Ph.D.   05/28/2022 Olivia [name]                   Diagnosis: Major Depressive Disorder, recurrent, mild Attention Deficit Disorder, predominantly inattentive type  Olivia [name] reports that she had an incident occur at work that she needed to vent about.  I provided the support and guidance needed.  Olivia [name] has a challenging week ahead with her husband's travel and  her father's surgery.  We d/e ways for her to manage her added responsibilities and anxiety.  She admitted to having some trouble falling asleep and I offered some strategies to conclude before she bagan to wind down for the night.  Lastly, I noted and we then d/p that in her family, she has felt or made herself responsible for managing others inappropriate behavior and comments.  Olivia [name] agree to journal on the subject this week.     Home Practice: journal on weekly prompts   [name], PhD  Friend - Amy

## 2023-01-20 ENCOUNTER — Ambulatory Visit (INDEPENDENT_AMBULATORY_CARE_PROVIDER_SITE_OTHER): Payer: 59 | Admitting: Psychology

## 2023-01-20 DIAGNOSIS — F320 Major depressive disorder, single episode, mild: Secondary | ICD-10-CM

## 2023-01-20 NOTE — Progress Notes (Addendum)
PROGRESS NOTE:   Name: Olivia [name] Date: 01/20/2023 MRN: [id number] DOB: 1966/08/14 PCP: Leamon Arnt, MD  Time spent: 4:00 - 4:55 PM  Annual Review: 05/29/2023  Today I met with  Olivia [name] in remote video (WebEx) face-to-face individual psychotherapy.  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 She is normally a high functioning and driven individual and it is a struggle to get motivated to do anything. If it is necessary for work she does it and does a good job, but it is not the same. She has gained 70 pounds in less than 9 months and it has impacted her self image. She is also peri menopausal and these hormonal changes are also likely to be contributing to her changing mood states.   A year and a half ago her father had quadrupel by-pass surgery and he was recently diagnosed with cancer. 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  Olivia [name] states that she was diagnosed with adult ADHD and responded well to medications.  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)   Son: Olivia [name] (45) he is on the ASD, he is a Paramedic and attends [employer]   Mental Status Exam: Appearance:   Casual     Behavior:  Appropriate  Motor:  Normal  Speech/Language:   NA  Affect:  Appropriate  Mood:  normal  Thought process:  normal  Thought content:    WNL  Sensory/Perceptual disturbances:    WNL  Orientation:  oriented to person, place, time/date, situation, and day of week  Attention:  Good  Concentration:  Good  Memory:  WNL  Fund of knowledge:   Good  Insight:    Good  Judgment:   Good  Impulse Control:  Good    Diagnoses:  No diagnosis found.   Individualized Treatment  Plan                Strengths: bright, verbal, motivated, self aware, self advocate   Supports: spouse, family, friends, colleagues   Goal/Needs for Treatment:  In order of importance to patient  1) Develop healthy interpersonal relationships that lead to the alleviation and help prevent the relapse of depression.  2) Learn and implement coping skills that result in a reduction of anxiety and worry, and improved functioning.   3) Combine skills learned in therapy into a new daily approach to managing ADHD.   Client Statement of Needs: requests assistance with piecing back together her life as it once was before she got depressed (burnt out).   Treatment Level: Weekly Individual Outpatient Psychotherapy.  Symptoms: autonomic hyperactivity (e.g., palpitations, shortness of breath, dry mouth, trouble swallowing, nausea, diarrhea).  Childhood history of Attention Deficit Disorder (ADD) that was either diagnosed or later concluded due to the symptoms of behavioral problems at school, impulsivity, temper outbursts, and lack of concentration.  Depressed or irritable mood. Diminished interest in or enjoyment of activities. Easily distracted and drawn from task at hand.  Excessive and/or unrealistic worry that is difficult to control occurring more days than not for at least 6 months about a number of events or activities.  Feelings of hopelessness, worthlessness, or inappropriate guilt.  Lack of energy.   Poor concentration and indecisiveness.  Restless and fidgety; unable to be sedentary for more than a short time.   Client Treatment Preferences: Continue with current therapist   Healthcare consumer's goal for treatment:  Psychologist, [name], Ph.D. will support the patient's ability to achieve the goals identified. Cognitive Behavioral Therapy, Dialectical Behavioral Therapy, Motivational Interviewing, SPACE, parent training, and other evidenced-based practices will be used to  promote progress towards healthy functioning.   Healthcare consumer Olivia [name] will: Actively participate in therapy, working towards healthy functioning.    *Justification for Continuation/Discontinuation of Goal: R=Revised, O=Ongoing, A=Achieved, D=Discontinued  Goal 1) Develop healthy interpersonal relationships that lead to the alleviation and help prevent the relapse of depression.  5 Point Likert rating baseline date: 05/28/2021 Target Date Goal Was reviewed Status Code Progress towards goal/Likert rating  05/29/2023 05/28/2022         O 3/5 - learning to set better limits and boundaries, still hasn't been able to regulate her daily routines             Goal 2) Learn and implement coping skills that result in a reduction of anxiety and worry, and improved daily functioning.  5 Point Likert rating baseline date: 05/28/2021  Target Date Goal Was reviewed Status Code Progress towards goal/Likert rating  05/29/2023 05/28/2022          O 2/5 - learning to use her skills more consistently             Goal 3) Combine skills learned in therapy into a new daily approach to managing ADHD.  5 Point Likert rating baseline date: 05/28/2021  Target Date Goal Was reviewed Status Code Progress towards goal/Likert rating  05/29/2023 05/28/2022          O 2/5 - learning to use her skills more consistently             This plan has been reviewed and created by the following participants:  This plan will be reviewed at least every 12 months. Date Behavioral Health Clinician Date Guardian/Patient   05/28/2022 [name], Ph.D.   05/28/2022 Olivia [name]                     Diagnosis: Major Depressive Disorder, recurrent, mild Generalized Anxiety Disorder   Olivia [name] reports that she has been sleeping better.  She tried some of the suggestions I shared with her last week.  Olivia [name] states that she accomplished more this week than she has in a very long time.  She admitted that  she still struggles with negative thoughts around not doing enough.  We d/e/p the roots of her over active super ego, made connections between the past and the present, and the need for self compassion.     Home Practice: journal on weekly prompts   [name], PhD  Friend - Amy  Super Ego - Inez Catalina

## 2023-01-27 ENCOUNTER — Ambulatory Visit (INDEPENDENT_AMBULATORY_CARE_PROVIDER_SITE_OTHER): Payer: 59 | Admitting: Psychology

## 2023-01-27 DIAGNOSIS — F320 Major depressive disorder, single episode, mild: Secondary | ICD-10-CM

## 2023-01-27 NOTE — Progress Notes (Signed)
PROGRESS NOTE:   Name: Olivia [name] Date: 01/27/2023 MRN: [id number] DOB: 02-18-1966 PCP: Leamon Arnt, MD  Time spent: 4:00 - 4:55 PM  Annual Review: 05/29/2023  Today I met with  Olivia [name] in remote video (WebEx) face-to-face individual psychotherapy.  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 She is normally a high functioning and driven individual and it is a struggle to get motivated to do anything. If it is necessary for work she does it and does a good job, but it is not the same. She has gained 70 pounds in less than 9 months and it has impacted her self image. She is also peri menopausal and these hormonal changes are also likely to be contributing to her changing mood states.   A year and a half ago her father had quadrupel by-pass surgery and he was recently diagnosed with cancer. 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  Olivia [name] states that she was diagnosed with adult ADHD and responded well to medications.  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   Individualized Treatment  Plan                Strengths: bright, verbal, motivated, self aware, self advocate   Supports: spouse, family, friends, colleagues   Goal/Needs for Treatment:  In order of importance to patient  1) Develop healthy interpersonal relationships that lead to the alleviation and help prevent the relapse of depression.  2) Learn and implement coping skills that result in a reduction of anxiety and worry, and improved functioning.   3) Combine skills learned in therapy into a new daily approach to managing ADHD.   Client Statement of Needs: requests assistance with piecing back together her life as it once was before she got depressed (burnt out).   Treatment Level: Weekly Individual Outpatient Psychotherapy.  Symptoms: autonomic hyperactivity (e.g., palpitations, shortness of breath, dry mouth, trouble swallowing, nausea, diarrhea).  Childhood history of Attention Deficit Disorder (ADD) that was either diagnosed or later concluded due to the symptoms of behavioral problems at school, impulsivity, temper outbursts, and lack of concentration.  Depressed or irritable mood. Diminished interest in or enjoyment of activities. Easily distracted and drawn from task at hand.  Excessive and/or unrealistic worry that is difficult to control occurring more days than not for at least 6 months about a number of events or activities.  Feelings of hopelessness, worthlessness, or inappropriate guilt.  Lack of energy.   Poor concentration and indecisiveness.  Restless and fidgety; unable to be sedentary for more than a short time.   Client Treatment Preferences: Continue with current therapist   Healthcare consumer's goal for treatment:  Psychologist, [name], Ph.D. will support the patient's ability to achieve the goals identified. Cognitive Behavioral Therapy, Dialectical Behavioral Therapy, Motivational Interviewing, SPACE, parent training, and other evidenced-based practices will be used to  promote progress towards healthy functioning.   Healthcare consumer Olivia [name] will: Actively participate in therapy, working towards healthy functioning.    *Justification for Continuation/Discontinuation of Goal: R=Revised, O=Ongoing, A=Achieved, D=Discontinued  Goal 1) Develop healthy interpersonal relationships that lead to the alleviation and help prevent the relapse of depression.  5 Point Likert rating baseline date: 05/28/2021 Target Date Goal Was reviewed Status Code Progress towards goal/Likert rating  05/29/2023 05/28/2022         O 3/5 - learning to set better limits and boundaries, still hasn't been able to regulate her daily routines             Goal 2) Learn and implement coping skills that result in a reduction of anxiety and worry, and improved daily functioning.  5 Point Likert rating baseline date: 05/28/2021  Target Date Goal Was reviewed Status Code Progress towards goal/Likert rating  05/29/2023 05/28/2022          O 2/5 - learning to use her skills more consistently             Goal 3) Combine skills learned in therapy into a new daily approach to managing ADHD.  5 Point Likert rating baseline date: 05/28/2021  Target Date Goal Was reviewed Status Code Progress towards goal/Likert rating  05/29/2023 05/28/2022          O 2/5 - learning to use her skills more consistently             This plan has been reviewed and created by the following participants:  This plan will be reviewed at least every 12 months. Date Behavioral Health Clinician Date Guardian/Patient   05/28/2022 [name], Ph.D.   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  We d/ that it's Spring Break and this time is particularly triggering.  A made several inquires  and she admitted that she had been pushing herself to get caught up.  I made a few suggestions for creating a new routine for chart reviews that would minimize her anxiety and still allow her to feel accomplished (ie., calming practice before starting, limiting the number of charts she reviews in one sitting, permitting herself to take breaks, etc.).     Home Practice: journal on weekly prompts   [name], PhD  Friend - Amy  Super Ego - Inez Catalina

## 2023-02-03 ENCOUNTER — Ambulatory Visit (INDEPENDENT_AMBULATORY_CARE_PROVIDER_SITE_OTHER): Payer: 59 | Admitting: Psychology

## 2023-02-03 DIAGNOSIS — F411 Generalized anxiety disorder: Secondary | ICD-10-CM

## 2023-02-03 NOTE — Progress Notes (Signed)
PROGRESS NOTE:   Name: Olivia [name] Date: 02/03/2023 MRN: [id number] DOB: February 25, 1966 PCP: Leamon Arnt, MD  Time spent: 4:00 - 4:55 PM  Annual Review: 05/29/2023  Today I met with  Olivia [name] in remote video (WebEx) face-to-face individual psychotherapy.  Distance Site: [name]: Dr Jannifer Franklin Remote Office Consent: Obtained verbal consent to transmit session remotely    Reason for Visit /Presenting Problem:  Following the discovery of a parathyroid tumor her life has changed dramatically. She is normally a high functioning and driven individual and it is a struggle to get motivated to do anything. If it is necessary for work she does it and does a good job, but it is not the same. She has gained 70 pounds in less than 9 months and it has impacted her self image. She is also peri menopausal and these hormonal changes are also likely to be contributing to her changing mood states.   A year and a half ago her father had quadrupel by-pass surgery and he was recently diagnosed with cancer. 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  Olivia [name] states that she was diagnosed with adult ADHD and responded well to medications.  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)   Son: Olivia [name] (85) he is on the ASD, he is a Paramedic and attends [employer]   Mental Status Exam: Appearance:   Casual     Behavior:  Appropriate  Motor:  Normal  Speech/Language:   NA  Affect:  Appropriate  Mood:  normal  Thought process:  normal  Thought content:    WNL  Sensory/Perceptual disturbances:    WNL  Orientation:  oriented to person, place, time/date, situation, and day of week  Attention:  Good  Concentration:  Good  Memory:  WNL  Fund of knowledge:   Good  Insight:    Good  Judgment:   Good  Impulse Control:  Good    Individualized Treatment Plan                Strengths: bright, verbal,  motivated, self aware, self advocate   Supports: spouse, family, friends, colleagues   Goal/Needs for Treatment:  In order of importance to patient  1) Develop healthy interpersonal relationships that lead to the alleviation and help prevent the relapse of depression.  2) Learn and implement coping skills that result in a reduction of anxiety and worry, and improved functioning.   3) Combine skills learned in therapy into a new daily approach to managing ADHD.   Client Statement of Needs: requests assistance with piecing back together her life as it once was before she got depressed (burnt out).   Treatment Level: Weekly Individual Outpatient Psychotherapy.  Symptoms: autonomic hyperactivity (e.g., palpitations, shortness of breath, dry mouth, trouble swallowing, nausea, diarrhea).  Childhood history of Attention Deficit Disorder (ADD) that was either diagnosed or later concluded due to the symptoms of behavioral problems at school, impulsivity, temper outbursts, and lack of concentration.  Depressed or irritable mood. Diminished interest in or enjoyment of activities. Easily distracted and drawn from task at hand.  Excessive and/or unrealistic worry that is difficult to control occurring more days than not for at least 6 months about a number of events or activities.  Feelings of hopelessness, worthlessness, or inappropriate guilt.  Lack of energy.   Poor concentration and indecisiveness.  Restless and fidgety; unable to be sedentary  for more than a short time.   Client Treatment Preferences: Continue with current therapist   Healthcare consumer's goal for treatment:  Psychologist, [name], Ph.D. will support the patient's ability to achieve the goals identified. Cognitive Behavioral Therapy, Dialectical Behavioral Therapy, Motivational Interviewing, SPACE, parent training, and other evidenced-based practices will be used to promote progress towards healthy functioning.    Healthcare consumer Olivia [name] will: Actively participate in therapy, working towards healthy functioning.    *Justification for Continuation/Discontinuation of Goal: R=Revised, O=Ongoing, A=Achieved, D=Discontinued  Goal 1) Develop healthy interpersonal relationships that lead to the alleviation and help prevent the relapse of depression.  5 Point Likert rating baseline date: 05/28/2021 Target Date Goal Was reviewed Status Code Progress towards goal/Likert rating  05/29/2023 05/28/2022         O 3/5 - learning to set better limits and boundaries, still hasn't been able to regulate her daily routines             Goal 2) Learn and implement coping skills that result in a reduction of anxiety and worry, and improved daily functioning.  5 Point Likert rating baseline date: 05/28/2021  Target Date Goal Was reviewed Status Code Progress towards goal/Likert rating  05/29/2023 05/28/2022          O 2/5 - learning to use her skills more consistently             Goal 3) Combine skills learned in therapy into a new daily approach to managing ADHD.  5 Point Likert rating baseline date: 05/28/2021  Target Date Goal Was reviewed Status Code Progress towards goal/Likert rating  05/29/2023 05/28/2022          O 2/5 - learning to use her skills more consistently             This plan has been reviewed and created by the following participants:  This plan will be reviewed at least every 12 months. Date Behavioral Health Clinician Date Guardian/Patient   05/28/2022 [name], Ph.D.   05/28/2022 Olivia [name]                   Diagnosis: Major Depressive Disorder, recurrent, mild Generalized Anxiety Disorder Attention Deficit Disorder, predominantly inattentive   Olivia [name] reports that she and her husband have been "cranky" with each other.  We d/p that they are both adjusting to sleeping with one another again, now that he is back in town.  We've talked about prioritizing  sleep many times, and I gently confronted her about not f/t with setting up separate sleeping.  Lastly, we d/p that she didn't hear from her daughter over the holiday, but she has been going over to [employer] parents house.     Home Practice: journal on weekly prompts   [name], PhD  Friend - Amy  Super Ego - Inez Catalina

## 2023-02-10 ENCOUNTER — Ambulatory Visit: Payer: No Typology Code available for payment source | Admitting: Psychology

## 2023-02-11 ENCOUNTER — Ambulatory Visit (INDEPENDENT_AMBULATORY_CARE_PROVIDER_SITE_OTHER): Payer: 59 | Admitting: Psychology

## 2023-02-11 DIAGNOSIS — F900 Attention-deficit hyperactivity disorder, predominantly inattentive type: Secondary | ICD-10-CM | POA: Diagnosis not present

## 2023-02-11 NOTE — Progress Notes (Signed)
PROGRESS NOTE:   Name: Olivia [name] Date: 02/11/2023 MRN: [id number] DOB: 23-Feb-1966 PCP: Willow Ora, MD  Time spent: 4:00 - 4:55 PM  Annual Review: 05/29/2023  Today I met with  Olivia [name] in remote video (Caregility) face-to-face individual psychotherapy.  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 She is normally a high functioning and driven individual and it is a struggle to get motivated to do anything. If it is necessary for work she does it and does a good job, but it is not the same. She has gained 70 pounds in less than 9 months and it has impacted her self image. She is also peri menopausal and these hormonal changes are also likely to be contributing to her changing mood states.   A year and a half ago her father had quadrupel by-pass surgery and he was recently diagnosed with cancer. 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  Olivia [name] states that she was diagnosed with adult ADHD and responded well to medications.  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)   [name] he is on the ASD, he is a Holiday representative and attends [employer]   Mental Status Exam: Appearance:   Casual     Behavior:  Appropriate  Motor:  Normal  Speech/Language:   NA  Affect:  Appropriate  Mood:  normal  Thought process:  normal  Thought content:    WNL  Sensory/Perceptual disturbances:    WNL  Orientation:  oriented to person, place, time/date, situation, and day of week  Attention:  Good  Concentration:  Good  Memory:  WNL  Fund of knowledge:   Good  Insight:    Good  Judgment:   Good  Impulse Control:  Good    Individualized Treatment Plan                Strengths: bright,  verbal, motivated, self aware, self advocate   Supports: spouse, family, friends, colleagues   Goal/Needs for Treatment:  In order of importance to patient  1) Develop healthy interpersonal relationships that lead to the alleviation and help prevent the relapse of depression.  2) Learn and implement coping skills that result in a reduction of anxiety and worry, and improved functioning.   3) Combine skills learned in therapy into a new daily approach to managing ADHD.   Client Statement of Needs: requests assistance with piecing back together her life as it once was before she got depressed (burnt out).   Treatment Level: Weekly Individual Outpatient Psychotherapy.  Symptoms: autonomic hyperactivity (e.g., palpitations, shortness of breath, dry mouth, trouble swallowing, nausea, diarrhea).  Childhood history of Attention Deficit Disorder (ADD) that was either diagnosed or later concluded due to the symptoms of behavioral problems at school, impulsivity, temper outbursts, and lack of concentration.  Depressed or irritable mood. Diminished interest in or enjoyment of activities. Easily distracted and drawn from task at hand.  Excessive and/or unrealistic worry that is difficult to control occurring more days than not for at least 6 months about a number of events or activities.  Feelings of hopelessness, worthlessness, or inappropriate guilt.  Lack of energy.   Poor concentration and indecisiveness.  Restless and fidgety; unable to be sedentary  for more than a short time.   Client Treatment Preferences: Continue with current therapist   Healthcare consumer's goal for treatment:  Psychologist, [name], Ph.D. will support the patient's ability to achieve the goals identified. Cognitive Behavioral Therapy, Dialectical Behavioral Therapy, Motivational Interviewing, SPACE, parent training, and other evidenced-based practices will be used to promote progress towards healthy functioning.    Healthcare consumer Olivia [name] will: Actively participate in therapy, working towards healthy functioning.    *Justification for Continuation/Discontinuation of Goal: R=Revised, O=Ongoing, A=Achieved, D=Discontinued  Goal 1) Develop healthy interpersonal relationships that lead to the alleviation and help prevent the relapse of depression.  5 Point Likert rating baseline date: 05/28/2021 Target Date Goal Was reviewed Status Code Progress towards goal/Likert rating  05/29/2023 05/28/2022         O 3/5 - learning to set better limits and boundaries, still hasn't been able to regulate her daily routines             Goal 2) Learn and implement coping skills that result in a reduction of anxiety and worry, and improved daily functioning.  5 Point Likert rating baseline date: 05/28/2021  Target Date Goal Was reviewed Status Code Progress towards goal/Likert rating  05/29/2023 05/28/2022          O 2/5 - learning to use her skills more consistently             Goal 3) Combine skills learned in therapy into a new daily approach to managing ADHD.  5 Point Likert rating baseline date: 05/28/2021  Target Date Goal Was reviewed Status Code Progress towards goal/Likert rating  05/29/2023 05/28/2022          O 2/5 - learning to use her skills more consistently             This plan has been reviewed and created by the following participants:  This plan will be reviewed at least every 12 months. Date Behavioral Olivia Clinician Date Guardian/Patient   05/28/2022 [name], Ph.D.   05/28/2022 Olivia [name]                   Diagnosis: Major Depressive Disorder, recurrent, mild Generalized Anxiety Disorder Attention Deficit Disorder, predominantly inattentive   Olivia [name] reports that she was a bit frustrated with her son who is fight "Senioritis."  We d/ what occurred, how she responded and what they negotiated.  I encouraged her to ask for a meeting with his guidance  counselor to d/ his schedule for next year, struggle to get him to school and whether his choices will get him to the college of his choice.  Olivia [name] agreed to this plan.  Olivia [name] reports that her sleep has been very disrupted since her husband returned home.  We d/p what has been occurring that is disrupting her sleep and a Plan B until they can try/decide on a mattress.  I noted that if she were sleeping better, work wouldn't be so overwhelming.  She agreed to create an alternative sleep routine.   Home Practice: journal on weekly prompts   [name], PhD  Friend - Amy  Super Ego - Kathie Rhodes

## 2023-02-17 ENCOUNTER — Other Ambulatory Visit (HOSPITAL_BASED_OUTPATIENT_CLINIC_OR_DEPARTMENT_OTHER): Payer: Self-pay

## 2023-02-17 ENCOUNTER — Ambulatory Visit (INDEPENDENT_AMBULATORY_CARE_PROVIDER_SITE_OTHER): Payer: 59 | Admitting: Psychology

## 2023-02-17 DIAGNOSIS — F320 Major depressive disorder, single episode, mild: Secondary | ICD-10-CM

## 2023-02-17 DIAGNOSIS — F411 Generalized anxiety disorder: Secondary | ICD-10-CM | POA: Diagnosis not present

## 2023-02-17 DIAGNOSIS — F988 Other specified behavioral and emotional disorders with onset usually occurring in childhood and adolescence: Secondary | ICD-10-CM

## 2023-02-17 NOTE — Progress Notes (Signed)
PROGRESS NOTE:   Name: Olivia [name] Date: 02/17/2023 MRN: [id number] DOB: 1966/10/04 PCP: Olivia Ora, MD  Time spent: 4:00 - 4:55 PM  Annual Review: 05/29/2023  Today I met with  Olivia [name] in remote video (Caregility) face-to-face individual psychotherapy.  Distance Site: [name]: Dr Olivia [name] Remote Office Consent: Obtained verbal consent to transmit session remotely    Reason for Visit /Presenting Problem:  Following the discovery of a parathyroid tumor her life has changed dramatically. She is normally a high functioning and driven individual and it is a struggle to get motivated to do anything. If it is necessary for work she does it and does a good job, but it is not the same. She has gained 70 pounds in less than 9 months and it has impacted her self image. She is also peri menopausal and these hormonal changes are also likely to be contributing to her changing mood states.   A year and a half ago her father had quadrupel by-pass surgery and he was recently diagnosed with cancer. 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  [name] states that she was diagnosed with adult ADHD and responded well to medications.  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)   [name] he is on the ASD, he is a Holiday representative and attends [employer]   Mental Status Exam: Appearance:   Casual     Behavior:  Appropriate  Motor:  Normal  Speech/Language:   NA  Affect:  Appropriate  Mood:  normal  Thought process:  normal  Thought content:    WNL  Sensory/Perceptual disturbances:    WNL  Orientation:  oriented to person, place, time/date, situation, and day of week  Attention:  Good  Concentration:  Good  Memory:  WNL  Fund of knowledge:   Good  Insight:    Good  Judgment:   Good  Impulse Control:  Good    Individualized Treatment Plan                Strengths: bright,  verbal, motivated, self aware, self advocate   Supports: spouse, family, friends, colleagues   Goal/Needs for Treatment:  In order of importance to patient  1) Develop healthy interpersonal relationships that lead to the alleviation and help prevent the relapse of depression.  2) Learn and implement coping skills that result in a reduction of anxiety and worry, and improved functioning.   3) Combine skills learned in therapy into a new daily approach to managing ADHD.   Client Statement of Needs: requests assistance with piecing back together her life as it once was before she got depressed (burnt out).   Treatment Level: Weekly Individual Outpatient Psychotherapy.  Symptoms: autonomic hyperactivity (e.g., palpitations, shortness of breath, dry mouth, trouble swallowing, nausea, diarrhea).  Childhood history of Attention Deficit Disorder (ADD) that was either diagnosed or later concluded due to the symptoms of behavioral problems at school, impulsivity, temper outbursts, and lack of concentration.  Depressed or irritable mood. Diminished interest in or enjoyment of activities. Easily distracted and drawn from task at hand.  Excessive and/or unrealistic worry that is difficult to control occurring more days than not for at least 6 months about a number of events or activities.  Feelings of hopelessness, worthlessness, or inappropriate guilt.  Lack of energy.   Poor concentration and indecisiveness.  Restless and fidgety; unable to be sedentary  for more than a short time.   Client Treatment Preferences: Continue with current therapist   Healthcare consumer's goal for treatment:  Psychologist, Olivia [name], Ph.D. will support the patient's ability to achieve the goals identified. Cognitive Behavioral Therapy, Dialectical Behavioral Therapy, Motivational Interviewing, SPACE, parent training, and other evidenced-based practices will be used to promote progress towards healthy functioning.    Healthcare consumer Olivia [name] will: Actively participate in therapy, working towards healthy functioning.    *Justification for Continuation/Discontinuation of Goal: R=Revised, O=Ongoing, A=Achieved, D=Discontinued  Goal 1) Develop healthy interpersonal relationships that lead to the alleviation and help prevent the relapse of depression.  5 Point Likert rating baseline date: 05/28/2021 Target Date Goal Was reviewed Status Code Progress towards goal/Likert rating  05/29/2023 05/28/2022         O 3/5 - learning to set better limits and boundaries, still hasn't been able to regulate her daily routines             Goal 2) Learn and implement coping skills that result in a reduction of anxiety and worry, and improved daily functioning.  5 Point Likert rating baseline date: 05/28/2021  Target Date Goal Was reviewed Status Code Progress towards goal/Likert rating  05/29/2023 05/28/2022          O 2/5 - learning to use her skills more consistently             Goal 3) Combine skills learned in therapy into a new daily approach to managing ADHD.  5 Point Likert rating baseline date: 05/28/2021  Target Date Goal Was reviewed Status Code Progress towards goal/Likert rating  05/29/2023 05/28/2022          O 2/5 - learning to use her skills more consistently             This plan has been reviewed and created by the following participants:  This plan will be reviewed at least every 12 months. Date Behavioral Olivia Clinician Date Guardian/Patient   05/28/2022 Olivia [name], Ph.D.   05/28/2022 Olivia Feil" Andrey [name]                   Diagnosis: Major Depressive Disorder, recurrent, mild Generalized Anxiety Disorder Attention Deficit Disorder, predominantly inattentive   Last session, we d/p what has been occurring that is disrupting her sleep.  She shared that her husband's Olivia issues interfered with deciding on a mattress.  Olivia [name] did get a good night sleep one evening and she  found that she was less overwhelmed.  We once again developed a plan for how she would prioritize her sleep.  Olivia [name] spoke with her son about a couple issues that have been bothering her.  She was able to "get to the bottom" of what has been driving some of his negative behaviors.  We d/p how she made use of the guidance I provided during our last session.  Lastly, we reviewed that I would be on vacation and when we would meet next.   Home Practice: journal on weekly prompts   Olivia Favors, PhD  Friend - Amy  Super Ego - Kathie Rhodes

## 2023-02-24 ENCOUNTER — Other Ambulatory Visit (HOSPITAL_BASED_OUTPATIENT_CLINIC_OR_DEPARTMENT_OTHER): Payer: Self-pay

## 2023-02-24 ENCOUNTER — Ambulatory Visit: Payer: No Typology Code available for payment source | Admitting: Psychology

## 2023-03-03 ENCOUNTER — Ambulatory Visit: Payer: No Typology Code available for payment source | Admitting: Psychology

## 2023-03-10 ENCOUNTER — Ambulatory Visit: Payer: No Typology Code available for payment source | Admitting: Psychology

## 2023-03-12 ENCOUNTER — Ambulatory Visit (INDEPENDENT_AMBULATORY_CARE_PROVIDER_SITE_OTHER): Payer: 59 | Admitting: Family Medicine

## 2023-03-12 ENCOUNTER — Encounter: Payer: Self-pay | Admitting: Family Medicine

## 2023-03-12 VITALS — BP 130/88 | HR 86 | Temp 98.4°F | Ht 68.0 in | Wt 350.2 lb

## 2023-03-12 DIAGNOSIS — E890 Postprocedural hypothyroidism: Secondary | ICD-10-CM

## 2023-03-12 DIAGNOSIS — E782 Mixed hyperlipidemia: Secondary | ICD-10-CM

## 2023-03-12 DIAGNOSIS — R590 Localized enlarged lymph nodes: Secondary | ICD-10-CM

## 2023-03-12 DIAGNOSIS — Z6841 Body Mass Index (BMI) 40.0 and over, adult: Secondary | ICD-10-CM | POA: Diagnosis not present

## 2023-03-12 NOTE — Patient Instructions (Signed)
Please return for your annual complete physical; please come fasting.   I will release your lab results to you on your MyChart account with further instructions. You may see the results before I do, but when I review them I will send you a message with my report or have my assistant call you if things need to be discussed. Please reply to my message with any questions. Thank you!    If you have any questions or concerns, please don't hesitate to send me a message via MyChart or call the office at [phone]. Thank you for visiting with Olivia [name] today! It's our pleasure caring for you.

## 2023-03-12 NOTE — Progress Notes (Signed)
Subjective  CC:  Chief Complaint  Patient presents with   Ear Pain    Pain has been on/off for the past year. Stated that she thought it would go away but more intense last week   Jaw Pain    HPI: Olivia [name] is a 57 y.o. female who presents to the office today to address the problems listed above in the chief complaint. 57 year old reports longstanding history of ear nose and throat problems.  Has history of allergies, chronic sinusitis.  Has been to ENT and reports has an unhealing ulcer in the left nasal mucosa but has been there for years.  Secondarily, she believes, she gets chronic ear pain.  Recently, ear pain/ear aching has worsened.  Tylenol typically would help but over the weekend it did not help at all.  No ear drainage.  No fevers or chills, no cold or cough symptoms.  No sore throat.  Also, she had a small posterior auricular lymph node that has been there for about a year, but in the last several months, 2 more nodes have appeared.  They are tender.  They are small.  She has not felt any other lymph nodes.  She does have chronic psoriasis which is recently very flared but she currently does not have psoriatic patches in the scalp or around the ears that she is aware of.  No skin infections.  No recent ear infections.  She does suffer from chronic TMJ but this pain feels different. Hyperlipidemia: She had been on Crestor.  She dieted and lost a significant amount of weight.  Lipid panel at that time showed well-controlled cholesterol so she stopped the Crestor.  Nonfasting for recheck today. Hypothyroidism due for recheck.  Clinically she feels stable. Overdue for CPE Assessment  1. Posterior auricular lymphadenopathy   2. Mixed hyperlipidemia   3. Morbid obesity (HCC)   4. Post-surgical hypothyroidism      Plan  Postauricular lymphadenopathy, chronic: Most likely reactive.  Small but the superior node is firm.  Recommend ENT consultation for further evaluation.  Check  CBC.  Also need clarification why she had chronic ear pain. Check lipids.  May need to restart statin Continue diet and weight loss Thyroid levels.  Follow up: Complete physical Visit date not found  Orders Placed This Encounter  Procedures   CBC with Differential/Platelet   Comprehensive metabolic panel   Lipid panel   TSH   No orders of the defined types were placed in this encounter.     I reviewed the patients updated PMH, FH, and SocHx.    Patient Active Problem List   Diagnosis Date Noted   Post-surgical hypothyroidism 01/17/2021    Priority: High   Major depression, recurrent, chronic (HCC) 11/17/2018    Priority: High   History of laparoscopic adjustable gastric banding 05/08/2012    Priority: High   Mixed hyperlipidemia 04/15/2008    Priority: High   Morbid obesity (HCC) 04/15/2008    Priority: High   Ventricular bigeminy 11/17/2018    Priority: Medium    Atopic dermatitis 08/19/2018    Priority: Medium    Elevated coronary artery calcium score, 90th percentile for age and gender 08/16/2018    Priority: Medium    Psoriasis 01/05/2018    Priority: Medium    IUD (intrauterine device) in place, Liletta 12/07/2017    Priority: Medium    PCOS (polycystic ovarian syndrome) 10/25/2014    Priority: Medium    Migraine with aura 01/11/2014  Priority: Medium    Vitamin D deficiency 01/05/2018    Priority: Low   Allergic rhinitis 04/15/2008    Priority: Low   Allergic asthma 04/15/2008    Priority: Low   History of total thyroidectomy 01/17/2021   History of parathyroid surgery 2021 01/17/2021   Hyperparathyroidism, primary (HCC) 05/22/2020   Chronic low back pain with sciatica 01/05/2018   Current Meds  Medication Sig   albuterol (PROVENTIL HFA) 108 (90 Base) MCG/ACT inhaler Inhale 2 puffs into the lungs every 6 (six) hours as needed for wheezing or shortness of breath.   albuterol (PROVENTIL) (2.5 MG/3ML) 0.083% nebulizer solution Take 3 mLs (2.5 mg  total) by nebulization every 6 (six) hours as needed for wheezing or shortness of breath.   ALPRAZolam (XANAX) 0.5 MG tablet Take 1 tablet (0.5 mg total) by mouth 2 (two) times daily as needed for anxiety.   buPROPion (WELLBUTRIN XL) 150 MG 24 hr tablet Take 1 tablet (150 mg total) by mouth daily.   cetirizine (ZYRTEC) 10 MG tablet Take 10 mg by mouth daily as needed for allergies.    escitalopram (LEXAPRO) 20 MG tablet Take 1 tablet (20 mg total) by mouth daily.   famotidine (PEPCID) 20 MG tablet Take 20 mg by mouth daily as needed for heartburn or indigestion.   levothyroxine (SYNTHROID) 112 MCG tablet Take 1 tablet (112 mcg total) by mouth daily.   liothyronine (CYTOMEL) 25 MCG tablet Take 1 tablet by mouth once daily.   metoprolol tartrate (LOPRESSOR) 25 MG tablet Take 12.5 mg by mouth daily as needed.   Multiple Vitamin (MULITIVITAMIN WITH MINERALS) TABS Take 1 tablet by mouth daily.   triamcinolone cream (KENALOG) 0.1 % Apply as directed to affected area twice a day for 10-14 days as needed for flares   Vitamin D, Cholecalciferol, 50 MCG (2000 UT) CAPS Take 2,000 Units by mouth daily.   [DISCONTINUED] influenza vac split quadrivalent PF (FLUARIX) 0.5 ML injection Inject into the muscle.   [DISCONTINUED] SYNTHROID 112 MCG tablet 1 tablet in the morning on an empty stomach Orally Once a day 90 days    Allergies: Patient is allergic to cephalosporins, egg-derived products, penicillins, sulfa drugs cross reactors, adhesive [tape], and effexor [venlafaxine]. Family History: Patient family history includes Colon cancer in her mother; Colon polyps in her mother; Diabetes in her mother; Hypertension in her father; Obesity in her father, mother, and sister. Social History:  Patient  reports that she has never smoked. She has never used smokeless tobacco. She reports that she does not drink alcohol and does not use drugs.  Review of Systems: Constitutional: Negative for fever malaise or  anorexia Cardiovascular: negative for chest pain Respiratory: negative for SOB or persistent cough Gastrointestinal: negative for abdominal pain  Objective  Vitals: BP 130/88   Pulse 86   Temp 98.4 F (36.9 C)   Ht 5\' 8"  (1.727 m)   Wt (!) 350 lb 3.2 oz (158.8 kg)   SpO2 95%   BMI 53.25 kg/m  General: no acute distress , A&Ox3 HEENT: PEERL, conjunctiva normal, neck is supple, bilateral TMs are clear, external auditory canals are normal, left posterior auricular nodes x 3 present with mild tenderness, the nodes are mobile but superior node is firm.  Vary between approximately 3 to 7 mm.  No scalp lesions noted.   Commons side effects, risks, benefits, and alternatives for medications and treatment plan prescribed today were discussed, and the patient expressed understanding of the given instructions. Patient is instructed  to call or message via MyChart if he/she has any questions or concerns regarding our treatment plan. No barriers to understanding were identified. We discussed Red Flag symptoms and signs in detail. Patient expressed understanding regarding what to do in case of urgent or emergency type symptoms.  Medication list was reconciled, printed and provided to the patient in AVS. Patient instructions and summary information was reviewed with the patient as documented in the AVS. This note was prepared with assistance of Dragon voice recognition software. Occasional wrong-word or sound-a-like substitutions may have occurred due to the inherent limitations of voice recognition software

## 2023-03-13 LAB — CBC WITH DIFFERENTIAL/PLATELET
Basophils Absolute: 0 10*3/uL (ref 0.0–0.1)
Basophils Relative: 0.6 % (ref 0.0–3.0)
Eosinophils Absolute: 0.3 10*3/uL (ref 0.0–0.7)
Eosinophils Relative: 3 % (ref 0.0–5.0)
HCT: 45.1 % (ref 36.0–46.0)
Hemoglobin: 15 g/dL (ref 12.0–15.0)
Lymphocytes Relative: 27.8 % (ref 12.0–46.0)
Lymphs Abs: 2.4 10*3/uL (ref 0.7–4.0)
MCHC: 33.2 g/dL (ref 30.0–36.0)
MCV: 85.4 fl (ref 78.0–100.0)
Monocytes Absolute: 0.6 10*3/uL (ref 0.1–1.0)
Monocytes Relative: 6.5 % (ref 3.0–12.0)
Neutro Abs: 5.4 10*3/uL (ref 1.4–7.7)
Neutrophils Relative %: 62.1 % (ref 43.0–77.0)
Platelets: 292 10*3/uL (ref 150.0–400.0)
RBC: 5.28 Mil/uL — ABNORMAL HIGH (ref 3.87–5.11)
RDW: 13.2 % (ref 11.5–15.5)
WBC: 8.7 10*3/uL (ref 4.0–10.5)

## 2023-03-13 LAB — COMPREHENSIVE METABOLIC PANEL
ALT: 11 U/L (ref 0–35)
AST: 17 U/L (ref 0–37)
Albumin: 3.9 g/dL (ref 3.5–5.2)
Alkaline Phosphatase: 50 U/L (ref 39–117)
BUN: 9 mg/dL (ref 6–23)
CO2: 26 mEq/L (ref 19–32)
Calcium: 9.3 mg/dL (ref 8.4–10.5)
Chloride: 105 mEq/L (ref 96–112)
Creatinine, Ser: 0.96 mg/dL (ref 0.40–1.20)
GFR: 66.84 mL/min (ref >60.00)
Glucose, Bld: 95 mg/dL (ref 70–99)
Potassium: 3.9 mEq/L (ref 3.5–5.1)
Sodium: 141 mEq/L (ref 135–145)
Total Bilirubin: 0.8 mg/dL (ref 0.2–1.2)
Total Protein: 6.5 g/dL (ref 6.0–8.3)

## 2023-03-13 LAB — LIPID PANEL
Cholesterol: 223 mg/dL — ABNORMAL HIGH (ref 0–200)
HDL: 62 mg/dL (ref >39.00)
LDL Cholesterol: 145 mg/dL — ABNORMAL HIGH (ref 0–99)
NonHDL: 160.75
Total CHOL/HDL Ratio: 4
Triglycerides: 81 mg/dL (ref 0.0–149.0)
VLDL: 16.2 mg/dL (ref 0.0–40.0)

## 2023-03-13 LAB — TSH: TSH: 1.16 u[IU]/mL (ref 0.35–5.50)

## 2023-03-17 ENCOUNTER — Ambulatory Visit: Payer: 59 | Admitting: Psychology

## 2023-03-18 ENCOUNTER — Ambulatory Visit (INDEPENDENT_AMBULATORY_CARE_PROVIDER_SITE_OTHER): Payer: 59 | Admitting: Psychology

## 2023-03-18 ENCOUNTER — Other Ambulatory Visit: Payer: Self-pay | Admitting: Family Medicine

## 2023-03-18 ENCOUNTER — Other Ambulatory Visit (HOSPITAL_COMMUNITY): Payer: Self-pay

## 2023-03-18 DIAGNOSIS — F411 Generalized anxiety disorder: Secondary | ICD-10-CM

## 2023-03-18 MED ORDER — ROSUVASTATIN CALCIUM 10 MG PO TABS
10.0000 mg | ORAL_TABLET | Freq: Every day | ORAL | 3 refills | Status: DC
  Filled 2023-03-18: qty 90, 90d supply, fill #0

## 2023-03-18 NOTE — Progress Notes (Signed)
PROGRESS NOTE:   Name: Olivia [name] Date: 03/18/2023 MRN: [id number] DOB: 09/21/1966 PCP: Willow Ora, MD  Time spent: 10:00 - 10:55 AM  Annual Review: 05/29/2023  Today I met with  Olivia [name] in remote video (Caregility) face-to-face individual psychotherapy.  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 She is normally a high functioning and driven individual and it is a struggle to get motivated to do anything. If it is necessary for work she does it and does a good job, but it is not the same. She has gained 70 pounds in less than 9 months and it has impacted her self image. She is also peri menopausal and these hormonal changes are also likely to be contributing to her changing mood states.   A year and a half ago her father had quadrupel by-pass surgery and he was recently diagnosed with cancer. 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  Olivia [name] states that she was diagnosed with adult ADHD and responded well to medications.  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)   [name] he is on the ASD, he is a Holiday representative and attends [employer]   Mental Status Exam: Appearance:   Casual     Behavior:  Appropriate  Motor:  Normal  Speech/Language:   NA  Affect:  Appropriate  Mood:  normal  Thought process:  normal  Thought content:    WNL  Sensory/Perceptual disturbances:    WNL  Orientation:  oriented to person, place, time/date, situation, and day of week  Attention:  Good  Concentration:  Good  Memory:  WNL  Fund of knowledge:   Good  Insight:    Good  Judgment:   Good  Impulse Control:  Good    Individualized Treatment Plan                Strengths: bright,  verbal, motivated, self aware, self advocate   Supports: spouse, family, friends, colleagues   Goal/Needs for Treatment:  In order of importance to patient  1) Develop healthy interpersonal relationships that lead to the alleviation and help prevent the relapse of depression.  2) Learn and implement coping skills that result in a reduction of anxiety and worry, and improved functioning.   3) Combine skills learned in therapy into a new daily approach to managing ADHD.   Client Statement of Needs: requests assistance with piecing back together her life as it once was before she got depressed (burnt out).   Treatment Level: Weekly Individual Outpatient Psychotherapy.  Symptoms: autonomic hyperactivity (e.g., palpitations, shortness of breath, dry mouth, trouble swallowing, nausea, diarrhea).  Childhood history of Attention Deficit Disorder (ADD) that was either diagnosed or later concluded due to the symptoms of behavioral problems at school, impulsivity, temper outbursts, and lack of concentration.  Depressed or irritable mood. Diminished interest in or enjoyment of activities. Easily distracted and drawn from task at hand.  Excessive and/or unrealistic worry that is difficult to control occurring more days than not for at least 6 months about a number of events or activities.  Feelings of hopelessness, worthlessness, or inappropriate guilt.  Lack of energy.   Poor concentration and indecisiveness.  Restless and fidgety; unable to be sedentary  for more than a short time.   Client Treatment Preferences: Continue with current therapist   Healthcare consumer's goal for treatment:  Psychologist, [name], Ph.D. will support the patient's ability to achieve the goals identified. Cognitive Behavioral Therapy, Dialectical Behavioral Therapy, Motivational Interviewing, SPACE, parent training, and other evidenced-based practices will be used to promote progress towards healthy functioning.    Healthcare consumer Olivia [name] will: Actively participate in therapy, working towards healthy functioning.    *Justification for Continuation/Discontinuation of Goal: R=Revised, O=Ongoing, A=Achieved, D=Discontinued  Goal 1) Develop healthy interpersonal relationships that lead to the alleviation and help prevent the relapse of depression.  5 Point Likert rating baseline date: 05/28/2021 Target Date Goal Was reviewed Status Code Progress towards goal/Likert rating  05/29/2023 05/28/2022         O 3/5 - learning to set better limits and boundaries, still hasn't been able to regulate her daily routines             Goal 2) Learn and implement coping skills that result in a reduction of anxiety and worry, and improved daily functioning.  5 Point Likert rating baseline date: 05/28/2021  Target Date Goal Was reviewed Status Code Progress towards goal/Likert rating  05/29/2023 05/28/2022          O 2/5 - learning to use her skills more consistently             Goal 3) Combine skills learned in therapy into a new daily approach to managing ADHD.  5 Point Likert rating baseline date: 05/28/2021  Target Date Goal Was reviewed Status Code Progress towards goal/Likert rating  05/29/2023 05/28/2022          O 2/5 - learning to use her skills more consistently             This plan has been reviewed and created by the following participants:  This plan will be reviewed at least every 12 months. Date Behavioral Olivia Clinician Date Guardian/Patient   05/28/2022 [name], Ph.D.   05/28/2022 Olivia [name]                   Diagnosis: Major Depressive Disorder, recurrent, mild Generalized Anxiety Disorder Attention Deficit Disorder, predominantly inattentive   Cathy f/t with a number of tasks she agreed to complete.  We d/p what she did and how she responded.  She states that after our last session, she has become more aware of how much pain she is in due to her  psoriasis and a problem with her ear (sinus canal).  We d/p that she needs to attend to her physical needs better and that years of disassociating from her pain (burning & itching) has taught her not to notice when she needs care.      Home Practice: journal on weekly prompts   [name], PhD  Friend - Amy  Super Ego - Kathie Rhodes

## 2023-03-21 ENCOUNTER — Other Ambulatory Visit: Payer: Self-pay

## 2023-03-21 ENCOUNTER — Other Ambulatory Visit (HOSPITAL_BASED_OUTPATIENT_CLINIC_OR_DEPARTMENT_OTHER): Payer: Self-pay

## 2023-03-21 DIAGNOSIS — Z79899 Other long term (current) drug therapy: Secondary | ICD-10-CM | POA: Diagnosis not present

## 2023-03-21 DIAGNOSIS — L409 Psoriasis, unspecified: Secondary | ICD-10-CM | POA: Diagnosis not present

## 2023-03-21 MED ORDER — CLOBETASOL PROPIONATE 0.05 % EX CREA
TOPICAL_CREAM | CUTANEOUS | 1 refills | Status: DC
  Filled 2023-03-21: qty 45, 14d supply, fill #0
  Filled 2023-05-20: qty 45, 14d supply, fill #1

## 2023-03-21 MED ORDER — CLOBETASOL PROPIONATE 0.05 % EX OINT
TOPICAL_OINTMENT | CUTANEOUS | 1 refills | Status: DC
  Filled 2023-03-21: qty 45, 14d supply, fill #0
  Filled 2023-05-20: qty 45, 14d supply, fill #1

## 2023-03-24 ENCOUNTER — Ambulatory Visit (INDEPENDENT_AMBULATORY_CARE_PROVIDER_SITE_OTHER): Payer: 59 | Admitting: Psychology

## 2023-03-24 DIAGNOSIS — F320 Major depressive disorder, single episode, mild: Secondary | ICD-10-CM

## 2023-03-24 NOTE — Progress Notes (Signed)
PROGRESS NOTE:   Name: Olivia [name] Date: 03/24/2023 MRN: [id number] DOB: [birth year] PCP: Willow Ora, MD  Time spent: 4:00 - 4:55 PM  Annual Review: 05/29/2023  Today I met with  Olivia [name] in remote video (Caregility) face-to-face individual psychotherapy.  Distance Site: [name]: Dr Odette Horns Remote Office Consent: Obtained verbal consent to transmit session remotely    Reason for Visit /Presenting Problem:  Following the discovery of a parathyroid tumor Olivia [name] life has changed dramatically. Olivia [name] is normally a high functioning and driven individual and it is a struggle to get motivated to do anything. If it is necessary for work Olivia [name] does it and does a good job, but it is not the same. Olivia [name] has gained 70 pounds in less than 9 months and it has impacted Olivia [name] self image. Olivia [name] is also peri menopausal and these hormonal changes are also likely to be contributing to Olivia [name] changing mood states.   A year and a half ago Olivia [name] father had quadrupel by-pass surgery and he was recently diagnosed with cancer. Olivia [name] is the medical point person who had to keep Olivia [name] family informed.  Olivia [name] states that Olivia [name] was diagnosed with adult ADHD and responded well to medications.  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)   Olivia [name]: Health visitor (15) he is on the ASD, he is a Holiday representative and attends [employer]   Mental Status Exam: Appearance:   Casual     Behavior:  Appropriate  Motor:  Normal  Speech/Language:   NA  Affect:  Appropriate  Mood:  normal  Thought process:  normal  Thought content:    WNL  Sensory/Perceptual disturbances:    WNL  Orientation:  oriented to person, place, time/date, situation, and day of week  Attention:  Good  Concentration:  Good  Memory:  WNL  Fund of knowledge:   Good  Insight:    Good  Judgment:   Good  Impulse Control:  Good    Individualized Treatment Plan                Strengths: bright,  verbal, motivated, self aware, self advocate   Supports: spouse, family, friends, colleagues   Goal/Needs for Treatment:  In order of importance to patient  1) Develop healthy interpersonal relationships that lead to the alleviation and help prevent the relapse of depression.  2) Learn and implement coping skills that result in a reduction of anxiety and worry, and improved functioning.   3) Combine skills learned in therapy into a new daily approach to managing ADHD.   Client Statement of Needs: requests assistance with piecing back together Olivia [name] life as it once was before Olivia [name] got depressed (burnt out).   Treatment Level: Weekly Individual Outpatient Psychotherapy.  Symptoms: autonomic hyperactivity (e.g., palpitations, shortness of breath, dry mouth, trouble swallowing, nausea, diarrhea).  Childhood history of Attention Deficit Disorder (ADD) that was either diagnosed or later concluded due to the symptoms of behavioral problems at school, impulsivity, temper outbursts, and lack of concentration.  Depressed or irritable mood. Diminished interest in or enjoyment of activities. Easily distracted and drawn from task at hand.  Excessive and/or unrealistic worry that is difficult to control occurring more days than not for at least 6 months about a number of events or activities.  Feelings of hopelessness, worthlessness, or inappropriate guilt.  Lack of energy.   Poor concentration and indecisiveness.  Restless and fidgety; unable to be  sedentary for more than a short time.   Client Treatment Preferences: Continue with current therapist   Healthcare consumer's goal for treatment:  Psychologist, [name], Ph.D. will support the patient's ability to achieve the goals identified. Cognitive Behavioral Therapy, Dialectical Behavioral Therapy, Motivational Interviewing, SPACE, parent training, and other evidenced-based practices will be used to promote progress towards healthy functioning.    Healthcare consumer Danise "Olivia [name]" Rougier will: Actively participate in therapy, working towards healthy functioning.    *Justification for Continuation/Discontinuation of Goal: R=Revised, O=Ongoing, A=Achieved, D=Discontinued  Goal 1) Develop healthy interpersonal relationships that lead to the alleviation and help prevent the relapse of depression.  5 Point Likert rating baseline date: 05/28/2021 Target Date Goal Was reviewed Status Code Progress towards goal/Likert rating  05/29/2023 05/28/2022         O 3/5 - learning to set better limits and boundaries, still hasn't been able to regulate Olivia [name] daily routines             Goal 2) Learn and implement coping skills that result in a reduction of anxiety and worry, and improved daily functioning.  5 Point Likert rating baseline date: 05/28/2021  Target Date Goal Was reviewed Status Code Progress towards goal/Likert rating  05/29/2023 05/28/2022          O 2/5 - learning to use Olivia [name] skills more consistently             Goal 3) Combine skills learned in therapy into a new daily approach to managing ADHD.  5 Point Likert rating baseline date: 05/28/2021  Target Date Goal Was reviewed Status Code Progress towards goal/Likert rating  05/29/2023 05/28/2022          O 2/5 - learning to use Olivia [name] skills more consistently             This plan has been reviewed and created by the following participants:  This plan will be reviewed at least every 12 months. Date Behavioral Health Clinician Date Guardian/Patient   05/28/2022 [name], Ph.D.   05/28/2022 [name]                   Diagnosis: Major Depressive Disorder, recurrent, mild Generalized Anxiety Disorder Attention Deficit Disorder, predominantly inattentive   Olivia [name] reports that Olivia [name] met with Olivia [name] new provider to address Olivia [name] psoriasis.  Olivia [name] dermatologist reported that 40% of Olivia [name] body was covered in rashes and sores.  We d/ how Olivia [name] normalized how much pain and burning Olivia [name]  was experiencing.  Olivia [name] was put immediately on a biologic and began "feeling like herself."  Olivia [name] back and joints were even feeling better.  Olivia [name] dermatologist was convinced that Olivia [name] has psoriatic arthritis and didn't even need to order an MRI.  We d/p the depth of Olivia [name] self forgetting.  We made some connections between the past and present and the complex reasons Olivia [name] has stopped paying attention to herself.  Olivia [name] was assigned the task of reflecting, journaling and coming up with some "speed bumps" to help Olivia [name] slow down enough to pay attention to herself.   Home Practice: journal on weekly prompts   [name], PhD  Friend - Amy  Super Ego - Kathie Rhodes

## 2023-03-27 ENCOUNTER — Encounter: Payer: Self-pay | Admitting: Family Medicine

## 2023-03-27 ENCOUNTER — Ambulatory Visit (INDEPENDENT_AMBULATORY_CARE_PROVIDER_SITE_OTHER): Payer: 59 | Admitting: Family Medicine

## 2023-03-27 VITALS — BP 122/84 | HR 85 | Temp 98.0°F | Ht 68.0 in | Wt 343.4 lb

## 2023-03-27 DIAGNOSIS — R590 Localized enlarged lymph nodes: Secondary | ICD-10-CM | POA: Diagnosis not present

## 2023-03-27 DIAGNOSIS — R799 Abnormal finding of blood chemistry, unspecified: Secondary | ICD-10-CM | POA: Diagnosis not present

## 2023-03-27 DIAGNOSIS — E282 Polycystic ovarian syndrome: Secondary | ICD-10-CM

## 2023-03-27 DIAGNOSIS — E890 Postprocedural hypothyroidism: Secondary | ICD-10-CM

## 2023-03-27 DIAGNOSIS — R947 Abnormal results of other endocrine function studies: Secondary | ICD-10-CM | POA: Diagnosis not present

## 2023-03-27 DIAGNOSIS — R931 Abnormal findings on diagnostic imaging of heart and coronary circulation: Secondary | ICD-10-CM | POA: Diagnosis not present

## 2023-03-27 DIAGNOSIS — F339 Major depressive disorder, recurrent, unspecified: Secondary | ICD-10-CM | POA: Diagnosis not present

## 2023-03-27 DIAGNOSIS — E039 Hypothyroidism, unspecified: Secondary | ICD-10-CM | POA: Diagnosis not present

## 2023-03-27 DIAGNOSIS — R946 Abnormal results of thyroid function studies: Secondary | ICD-10-CM | POA: Diagnosis not present

## 2023-03-27 DIAGNOSIS — Z0000 Encounter for general adult medical examination without abnormal findings: Secondary | ICD-10-CM | POA: Diagnosis not present

## 2023-03-27 DIAGNOSIS — E280 Estrogen excess: Secondary | ICD-10-CM | POA: Diagnosis not present

## 2023-03-27 DIAGNOSIS — R7982 Elevated C-reactive protein (CRP): Secondary | ICD-10-CM | POA: Diagnosis not present

## 2023-03-27 DIAGNOSIS — E782 Mixed hyperlipidemia: Secondary | ICD-10-CM

## 2023-03-27 DIAGNOSIS — R5383 Other fatigue: Secondary | ICD-10-CM | POA: Diagnosis not present

## 2023-03-27 DIAGNOSIS — D6489 Other specified anemias: Secondary | ICD-10-CM | POA: Diagnosis not present

## 2023-03-27 NOTE — Progress Notes (Signed)
Subjective  Chief Complaint  Patient presents with   Annual Exam    Pt here for Annual Exam and is currently fasting     HPI: Olivia [name] is a 57 y.o. female who presents to [facility] [place] today for a Female Wellness Visit. She also has the concerns and/or needs as listed above in the chief complaint. These will be addressed in addition to the Health Maintenance Visit.   Wellness Visit: annual visit with health maintenance review and exam without Pap  HM: Pap smear and mammogram are current.  Both normal.  Had normal colonoscopy in 2018.  Every 5 year surveillance due to family history of colon cancer.  She has a scheduled this summer with Dr. Loreta Ave.  Working on weight loss.  Overall feeling well. Chronic disease f/u and/or acute problem visit: (deemed necessary to be done in addition to the wellness visit): Postauricular lymphadenopathy: See last note.  Has ENT visit tomorrow.  Fortunately nodes have lessened in size but 1 remains firm. Hypothyroidism with most recent TSH at goal.  Feels well on medication.  Taking levothyroxine 112 mcg daily with 25 mg of Cytomel.  Has been managed by endocrinology.  Would like to change to primary care.  Will request records. Hyperlipidemia with elevated coronary artery calcium score on statin again and tolerating well. Major chronic depression is stable on Wellbutrin.  She is seeing a therapist weekly still.  Working on Design[employer], [employer]ellery.  Improving.  Assessment  1. Annual physical exam   2. Post-surgical hypothyroidism   3. Mixed hyperlipidemia   4. Morbid obesity (HCC)   5. Major depression, recurrent, chronic (HCC)   6. Elevated coronary artery calcium score, 90th percentile for age and gender   8. PCOS (polycystic ovarian syndrome)   8. Posterior auricular lymphadenopathy      Plan  Female Wellness Visit: Age appropriate Health Maintenance and Prevention measures were discussed with patient. Included topics are  cancer screening recommendations, ways to keep healthy (see AVS) including dietary and exercise recommendations, regular eye and dental care, use of seat belts, and avoidance of moderate alcohol use and tobacco use.  He has colonoscopy scheduled for surveillance. BMI: discussed patient's BMI and encouraged positive lifestyle modifications to help get to or maintain a target BMI. HM needs and immunizations were addressed and ordered. See below for orders. See HM and immunization section for updates. Routine labs and screening tests ordered including cmp, cbc and lipids where appropriate. Discussed recommendations regarding Vit D and calcium supplementation (see AVS)  Chronic disease management visit and/or acute problem visit: Hyperlipidemia is controlled on rosuvastatin 10 mg nightly.  LFTs are normal Hypothyroidism, postsurgical is well-controlled on levothyroxine and Cytomel, 112 and 25 respectively.  Will request records from endocrinology. Continue to work on weight loss and diet Depression is well-controlled on Wellbutrin XL 150 mg daily and Lexapro 20 mg daily.  Continue with therapy.  She is in a much better place. Continue statin for elevated calcium score We will follow-up with ENT to see if any further recommendations are needed for her auricular lymphadenopathy.  Follow up: 12 months for complete physical No orders of the defined types were placed in this encounter.  No orders of the defined types were placed in this encounter.     Body mass index is 52.21 kg/m. Wt Readings from Last 3 Encounters:  03/27/23 (!) 343 lb 6.4 oz (155.8 kg)  03/12/23 (!) 350 lb 3.2 oz (158.8 kg)  12/02/22 (!) 338 lb 9.6 oz (153.6 kg)     Patient Active Problem List   Diagnosis Date Noted   Post-surgical hypothyroidism 01/17/2021    Priority: High    Thyroidectomy due to multiple nodules    Major depression, recurrent, chronic (HCC) 11/17/2018    Priority: High   History of laparoscopic  adjustable gastric banding 05/08/2012    Priority: High    CT 11/19: Small hiatal hernia. Distal circumferential esophageal wall thickening may reflect esophagitis. Lost the 100 pounds from the surgery and then gained it back.     Mixed hyperlipidemia 04/15/2008    Priority: High   Morbid obesity (HCC) 04/15/2008    Priority: High   Ventricular bigeminy 11/17/2018    Priority: Medium     2019: cards eval; intolerant to beta blockers    Atopic dermatitis 08/19/2018    Priority: Medium    Elevated coronary artery calcium score, 90th percentile for age and gender 08/16/2018    Priority: Medium     IMPRESSION: 1. Coronary artery calcium score 9.5 Agatston units. This places the patient in the 90th percentile for age and gender. This suggests high risk for future cardiac events. 2. This study is limited by artifact. However, there only appears tbe plaque in the proximal LAD. There is no more than mild stenosis.    Psoriasis 01/05/2018    Priority: Medium    IUD (intrauterine device) in place, Liletta 12/07/2017    Priority: Medium    PCOS (polycystic ovarian syndrome) 10/25/2014    Priority: Medium    Migraine with aura 01/11/2014    Priority: Medium    Vitamin D deficiency 01/05/2018    Priority: Low   Allergic rhinitis 04/15/2008    Priority: Low   Allergic asthma 04/15/2008    Priority: Low   History of total thyroidectomy 01/17/2021    Due to multiple nodules    History of parathyroid surgery 2021 01/17/2021    For primary hyperparathyroidism. Dr. Gerrit Friends    Hyperparathyroidism, primary (HCC) 05/22/2020   Chronic low back pain with sciatica 01/05/2018   Health Maintenance  Topic Date Due   Colonoscopy  09/29/2022   COVID-19 Vaccine (6 - 2023-24 season) 03/28/2023 (Originally 07/05/2022)   INFLUENZA VACCINE  06/05/2023   MAMMOGRAM  08/19/2023   PAP SMEAR-Modifier  08/19/2027   DTaP/Tdap/Td (2 - Td or Tdap) 05/01/2030   Hepatitis C Screening  Completed   HIV Screening   Completed   Zoster Vaccines- Shingrix  Completed   HPV VACCINES  Aged Out   Immunization History  Administered Date(s) Administered   Influenza Inj Mdck Quad Pf 07/21/2020   Influenza, Quadrivalent, Recombinant, Inj, Pf 08/03/2019   Influenza,inj,Quad PF,6+ Mos 08/07/2018, 07/21/2020   Influenza-Unspecified 10/25/2014, 08/15/2021   Moderna Covid-19 Vaccine Bivalent Booster 17yrs & up 03/21/2021   Moderna SARS-COV2 Booster Vaccination 03/29/2021   PFIZER(Purple Top)SARS-COV-2 Vaccination 10/27/2019, 11/17/2019, 08/18/2020   Tdap 05/01/2020   Tetanus 04/04/1997, 11/05/2007   Zoster Recombinat (Shingrix) 05/22/2021, 11/06/2021   We updated and reviewed the patient's past history in detail and it is documented below. Allergies: Patient is allergic to cephalosporins, egg-derived products, penicillins, sulfa drugs cross reactors, adhesive [tape], and effexor [venlafaxine]. Past Medical History Patient  has a past medical history of Anxiety, Asthma, Environmental allergies, Hiatal hernia, Hiatal hernia without gangrene or obstruction (08/19/2018), History of parathyroid surgery 2021 (01/17/2021), HLD (hyperlipidemia) (04/15/2008), Microcytic anemia, Migraine, hormonal, Multiple thyroid nodules (08/19/2018), PAC (premature atrial contraction), PACs (premature atrial contraction), Palpitations, PCOS (polycystic  ovarian syndrome) (10/25/2014), PONV (postoperative nausea and vomiting), Pre-diabetes, Reflux, and Seasonal allergies. Past Surgical History Patient  has a past surgical history that includes Tonsillectomy (1981); Laparoscopic gastric banding (08/08/2008); Hiatal hernia repair (04/14/2012); Thyroidectomy (N/A, 06/02/2020); and Parathyroidectomy (Left, 06/02/2020). Family History: Patient family history includes Colon cancer in her mother; Colon polyps in her mother; Diabetes in her mother; Hypertension in her father; Obesity in her father, mother, and sister. Social History:  Patient  reports  that she has never smoked. She has never used smokeless tobacco. She reports that she does not drink alcohol and does not use drugs.  Review of Systems: Constitutional: negative for fever or malaise Ophthalmic: negative for photophobia, double vision or loss of vision Cardiovascular: negative for chest pain, dyspnea on exertion, or new LE swelling Respiratory: negative for SOB or persistent cough Gastrointestinal: negative for abdominal pain, change in bowel habits or melena Genitourinary: negative for dysuria or gross hematuria, no abnormal uterine bleeding or disharge Musculoskeletal: negative for new gait disturbance or muscular weakness Integumentary: negative for new or persistent rashes, no breast lumps Neurological: negative for TIA or stroke symptoms Psychiatric: negative for SI or delusions Allergic/Immunologic: negative for hives  Patient Care Team    Relationship Specialty Notifications Start End  Willow Ora, MD PCP - General Family Medicine  05/01/20   Maeola Harman, MD Consulting Physician Neurosurgery  05/06/18   Josephine Igo, DO Consulting Physician Pulmonary Disease  11/17/18   Tonny Bollman, MD Consulting Physician Cardiology  11/17/18   Arminda Resides, MD Consulting Physician Dermatology  11/17/18   Noland Fordyce, MD Consulting Physician Obstetrics and Gynecology  11/17/18   Talmage Coin, MD Consulting Physician Endocrinology  12/19/19     Objective  Vitals: BP 122/84   Pulse 85   Temp 98 F (36.7 C)   Ht 5\' 8"  (1.727 m)   Wt (!) 343 lb 6.4 oz (155.8 kg)   SpO2 96%   BMI 52.21 kg/m  General:  Well developed, well nourished, no acute distress  Psych:  Alert and orientedx3,normal mood and affect HEENT:  Normocephalic, atraumatic, non-icteric sclera,  supple neck without adenopathy, mass or thyromegaly Cardiovascular:  Normal S1, S2, RRR without gallop, rub or murmur Respiratory:  Good breath sounds bilaterally, CTAB with normal respiratory  effort Gastrointestinal: normal bowel sounds, soft, non-tender, no noted masses. No HSM MSK: extremities without edema, joints without erythema or swelling Neurologic:    Mental status is normal.  Gross motor and sensory exams are normal.  No tremor  Commons side effects, risks, benefits, and alternatives for medications and treatment plan prescribed today were discussed, and the patient expressed understanding of the given instructions. Patient is instructed to call or message via MyChart if he/she has any questions or concerns regarding our treatment plan. No barriers to understanding were identified. We discussed Red Flag symptoms and signs in detail. Patient expressed understanding regarding what to do in case of urgent or emergency type symptoms.  Medication list was reconciled, printed and provided to the patient in AVS. Patient instructions and summary information was reviewed with the patient as documented in the AVS. This note was prepared with assistance of Dragon voice recognition software. Occasional wrong-word or sound-a-like substitutions may have occurred due to the inherent limitations of voice recognition software

## 2023-04-01 ENCOUNTER — Other Ambulatory Visit (HOSPITAL_COMMUNITY): Payer: Self-pay

## 2023-04-07 ENCOUNTER — Other Ambulatory Visit (HOSPITAL_BASED_OUTPATIENT_CLINIC_OR_DEPARTMENT_OTHER): Payer: Self-pay

## 2023-04-07 ENCOUNTER — Encounter: Payer: Self-pay | Admitting: Family Medicine

## 2023-04-07 ENCOUNTER — Ambulatory Visit (INDEPENDENT_AMBULATORY_CARE_PROVIDER_SITE_OTHER): Payer: 59 | Admitting: Psychology

## 2023-04-07 DIAGNOSIS — F320 Major depressive disorder, single episode, mild: Secondary | ICD-10-CM | POA: Diagnosis not present

## 2023-04-07 MED ORDER — LIOTHYRONINE SODIUM 25 MCG PO TABS
37.5000 ug | ORAL_TABLET | Freq: Every day | ORAL | 1 refills | Status: DC
  Filled 2023-04-07: qty 140, 90d supply, fill #0

## 2023-04-07 NOTE — Progress Notes (Signed)
PROGRESS NOTE:   Name: BAO MOOREFIELD Date: 04/07/2023 MRN: [id number] DOB: May 04, 1966 PCP: Willow Ora, MD  Time spent: 4:00 - 4:55 PM  Annual Review: 05/29/2023  Today I met with  [name]  in remote video (Caregility) face-to-face individual psychotherapy.  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 She is normally a high functioning and driven individual and it is a struggle to get motivated to do anything. If it is necessary for work she does it and does a good job, but it is not the same. She has gained 70 pounds in less than 9 months and it has impacted her self image. She is also peri menopausal and these hormonal changes are also likely to be contributing to her changing mood states.   A year and a half ago her father had quadrupel by-pass surgery and he was recently diagnosed with cancer. 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  Nickie states that she was diagnosed with adult ADHD and responded well to medications.  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)   Son: Health visitor (15) he is on the ASD, he is a Holiday representative and attends [employer]   Mental Status Exam: Appearance:   Casual     Behavior:  Appropriate  Motor:  Normal  Speech/Language:   NA  Affect:  Appropriate  Mood:  normal  Thought process:  normal  Thought content:    WNL  Sensory/Perceptual disturbances:    WNL  Orientation:  oriented to person, place, time/date, situation, and day of week  Attention:  Good  Concentration:  Good  Memory:  WNL  Fund of knowledge:   Good  Insight:    Good  Judgment:   Good  Impulse Control:  Good    Individualized Treatment Plan                Strengths: bright,  verbal, motivated, self aware, self advocate   Supports: spouse, family, friends, colleagues   Goal/Needs for Treatment:  In order of importance to patient  1) Develop healthy interpersonal relationships that lead to the alleviation and help prevent the relapse of depression.  2) Learn and implement coping skills that result in a reduction of anxiety and worry, and improved functioning.   3) Combine skills learned in therapy into a new daily approach to managing ADHD.   Client Statement of Needs: requests assistance with piecing back together her life as it once was before she got depressed (burnt out).   Treatment Level: Weekly Individual Outpatient Psychotherapy.  Symptoms: autonomic hyperactivity (e.g., palpitations, shortness of breath, dry mouth, trouble swallowing, nausea, diarrhea).  Childhood history of Attention Deficit Disorder (ADD) that was either diagnosed or later concluded due to the symptoms of behavioral problems at school, impulsivity, temper outbursts, and lack of concentration.  Depressed or irritable mood. Diminished interest in or enjoyment of activities. Easily distracted and drawn from task at hand.  Excessive and/or unrealistic worry that is difficult to control occurring more days than not for at least 6 months about a number of events or activities.  Feelings of hopelessness, worthlessness, or inappropriate guilt.  Lack of energy.   Poor concentration and indecisiveness.  Restless and fidgety; unable to be  sedentary for more than a short time.   Client Treatment Preferences: Continue with current therapist   Healthcare consumer's goal for treatment:  Psychologist, [name], Ph.D. will support the patient's ability to achieve the goals identified. Cognitive Behavioral Therapy, Dialectical Behavioral Therapy, Motivational Interviewing, SPACE, parent training, and other evidenced-based practices will be used to promote progress towards healthy functioning.    Healthcare consumer Polly "[name]" Wolper will: Actively participate in therapy, working towards healthy functioning.    *Justification for Continuation/Discontinuation of Goal: R=Revised, O=Ongoing, A=Achieved, D=Discontinued  Goal 1) Develop healthy interpersonal relationships that lead to the alleviation and help prevent the relapse of depression.  5 Point Likert rating baseline date: 05/28/2021 Target Date Goal Was reviewed Status Code Progress towards goal/Likert rating  05/29/2023 05/28/2022         O 3/5 - learning to set better limits and boundaries, still hasn't been able to regulate her daily routines             Goal 2) Learn and implement coping skills that result in a reduction of anxiety and worry, and improved daily functioning.  5 Point Likert rating baseline date: 05/28/2021  Target Date Goal Was reviewed Status Code Progress towards goal/Likert rating  05/29/2023 05/28/2022          O 2/5 - learning to use her skills more consistently             Goal 3) Combine skills learned in therapy into a new daily approach to managing ADHD.  5 Point Likert rating baseline date: 05/28/2021  Target Date Goal Was reviewed Status Code Progress towards goal/Likert rating  05/29/2023 05/28/2022          O 2/5 - learning to use her skills more consistently             This plan has been reviewed and created by the following participants:  This plan will be reviewed at least every 12 months. Date Behavioral Health Clinician Date Guardian/Patient   05/28/2022 [name], Ph.D.   05/28/2022 [name]                   Diagnosis: Major Depressive Disorder, recurrent, mild Generalized Anxiety Disorder Attention Deficit Disorder, predominantly inattentive   [name] reports that she continues to make progress related to sleep and exercise and is beginning to see positive outcomes.  [name] did get some labs that created high anxiety.  We d/e/p what occurred, how she  understood and responded to this information.  We then were able to address her realistic and distorted anxious thoughts.  I encouraged her to seek a second opinion with someone who is an expert in evaluating and treating disease of the parathyroid.  By instructing [name] to "do the research," I was able to activate coping behaviors and disrupt feelings of helplessness.  Lastly, [name] states that she has been more consistent with setting limits on her extended family and is beginning to see positive results.    Home Practice: journal on weekly prompts   [name], PhD  Friend - Amy  Super Ego - Kathie Rhodes

## 2023-04-08 ENCOUNTER — Other Ambulatory Visit (HOSPITAL_BASED_OUTPATIENT_CLINIC_OR_DEPARTMENT_OTHER): Payer: Self-pay

## 2023-04-08 NOTE — Telephone Encounter (Signed)
Please see pt message regarding recent lab results and possible surgeon consult

## 2023-04-09 ENCOUNTER — Other Ambulatory Visit (HOSPITAL_BASED_OUTPATIENT_CLINIC_OR_DEPARTMENT_OTHER): Payer: Self-pay

## 2023-04-09 ENCOUNTER — Other Ambulatory Visit (HOSPITAL_COMMUNITY): Payer: Self-pay

## 2023-04-10 ENCOUNTER — Other Ambulatory Visit (HOSPITAL_BASED_OUTPATIENT_CLINIC_OR_DEPARTMENT_OTHER): Payer: Self-pay

## 2023-04-10 ENCOUNTER — Other Ambulatory Visit (HOSPITAL_COMMUNITY): Payer: Self-pay

## 2023-04-10 MED ORDER — TALTZ 80 MG/ML ~~LOC~~ SOAJ
80.0000 mg | SUBCUTANEOUS | 2 refills | Status: DC
  Filled 2023-04-10: qty 2, 28d supply, fill #0

## 2023-04-10 MED ORDER — LIOTHYRONINE SODIUM 25 MCG PO TABS
37.5000 ug | ORAL_TABLET | Freq: Every day | ORAL | 1 refills | Status: DC
  Filled 2023-07-14: qty 45, 30d supply, fill #0
  Filled 2023-08-27: qty 45, 30d supply, fill #1
  Filled 2023-10-15: qty 45, 30d supply, fill #2
  Filled 2023-11-20: qty 45, 30d supply, fill #3
  Filled 2023-12-29: qty 45, 30d supply, fill #4
  Filled 2024-01-26: qty 45, 30d supply, fill #5
  Filled 2024-02-23: qty 45, 30d supply, fill #6

## 2023-04-10 MED ORDER — LEVOTHYROXINE SODIUM 200 MCG PO TABS
200.0000 ug | ORAL_TABLET | Freq: Every day | ORAL | 1 refills | Status: DC
  Filled 2023-04-10: qty 90, 90d supply, fill #0
  Filled 2023-07-14: qty 30, 30d supply, fill #1
  Filled 2023-07-23: qty 30, 30d supply, fill #2

## 2023-04-11 ENCOUNTER — Other Ambulatory Visit: Payer: Self-pay

## 2023-04-11 ENCOUNTER — Telehealth: Payer: Self-pay | Admitting: Pharmacist

## 2023-04-11 NOTE — Telephone Encounter (Signed)
Called patient to schedule an appointment for the [employer] [facility]. I was unable to reach the patient so I left a HIPAA-compliant message requesting that the patient return my call.   Luke Van Ausdall, PharmD, BCACP, CPP Clinical Pharmacist [facility] [phone]  

## 2023-04-14 ENCOUNTER — Other Ambulatory Visit (HOSPITAL_BASED_OUTPATIENT_CLINIC_OR_DEPARTMENT_OTHER): Payer: Self-pay

## 2023-04-14 ENCOUNTER — Ambulatory Visit (INDEPENDENT_AMBULATORY_CARE_PROVIDER_SITE_OTHER): Payer: 59 | Admitting: Psychology

## 2023-04-14 DIAGNOSIS — F320 Major depressive disorder, single episode, mild: Secondary | ICD-10-CM | POA: Diagnosis not present

## 2023-04-14 NOTE — Progress Notes (Signed)
PROGRESS NOTE:   Name: Olivia [name] Date: 04/14/2023 MRN: [id number] DOB: Nov 27, 1965 PCP: Willow Ora, MD  Time spent: 4:00 - 4:55 PM  Annual Review: 05/29/2023  Today I met with  Olivia [name]  in remote video (Caregility) face-to-face individual psychotherapy.  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 She is normally a high functioning and driven individual and it is a struggle to get motivated to do anything. If it is necessary for work she does it and does a good job, but it is not the same. She has gained 70 pounds in less than 9 months and it has impacted Olivia self image. She is also peri menopausal and these hormonal changes are also likely to be contributing to Olivia changing mood states.   A year and a half ago Olivia [name] had quadrupel by-pass surgery and he was recently diagnosed with cancer. She is the medical point person who had to keep Olivia [name] informed.  Skye states that she was diagnosed with adult ADHD and responded well to medications.  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)   [name]: Health visitor (15) he is on the ASD, he is a Holiday representative and attends [employer]   Mental Status Exam: Appearance:   Casual     Behavior:  Appropriate  Motor:  Normal  Speech/Language:   NA  Affect:  Appropriate  Mood:  normal  Thought process:  normal  Thought content:    WNL  Sensory/Perceptual disturbances:    WNL  Orientation:  oriented to person, place, time/date, situation, and day of week  Attention:  Good  Concentration:  Good  Memory:  WNL  Fund of knowledge:   Good  Insight:    Good  Judgment:   Good  Impulse Control:  Good    Individualized Treatment Plan                Strengths: bright,  verbal, motivated, self aware, self advocate   Supports: spouse, [name], friends, colleagues   Goal/Needs for Treatment:  In order of importance to patient  1) Develop healthy interpersonal relationships that lead to the alleviation and help prevent the relapse of depression.  2) Learn and implement coping skills that result in a reduction of anxiety and worry, and improved functioning.   3) Combine skills learned in therapy into a new daily approach to managing ADHD.   Client Statement of Needs: requests assistance with piecing back together Olivia life as it once was before she got depressed (burnt out).   Treatment Level: Weekly Individual Outpatient Psychotherapy.  Symptoms: autonomic hyperactivity (e.g., palpitations, shortness of breath, dry mouth, trouble swallowing, nausea, diarrhea).  Childhood history of Attention Deficit Disorder (ADD) that was either diagnosed or later concluded due to the symptoms of behavioral problems at school, impulsivity, temper outbursts, and lack of concentration.  Depressed or irritable mood. Diminished interest in or enjoyment of activities. Easily distracted and drawn from task at hand.  Excessive and/or unrealistic worry that is difficult to control occurring more days than not for at least 6 months about a number of events or activities.  Feelings of hopelessness, worthlessness, or inappropriate guilt.  Lack of energy.   Poor concentration and indecisiveness.  Restless and fidgety; unable to be  sedentary for more than a short time.   Client Treatment Preferences: Continue with current therapist   Healthcare consumer's goal for treatment:  Psychologist, [name], Ph.D. will support the patient's ability to achieve the goals identified. Cognitive Behavioral Therapy, Dialectical Behavioral Therapy, Motivational Interviewing, SPACE, parent training, and other evidenced-based practices will be used to promote progress towards healthy functioning.    Healthcare consumer Aften "[name]" Davilla will: Actively participate in therapy, working towards healthy functioning.    *Justification for Continuation/Discontinuation of Goal: R=Revised, O=Ongoing, A=Achieved, D=Discontinued  Goal 1) Develop healthy interpersonal relationships that lead to the alleviation and help prevent the relapse of depression.  5 Point Likert rating baseline date: 05/28/2021 Target Date Goal Was reviewed Status Code Progress towards goal/Likert rating  05/29/2023 05/28/2022         O 3/5 - learning to set better limits and boundaries, still hasn't been able to regulate Olivia daily routines             Goal 2) Learn and implement coping skills that result in a reduction of anxiety and worry, and improved daily functioning.  5 Point Likert rating baseline date: 05/28/2021  Target Date Goal Was reviewed Status Code Progress towards goal/Likert rating  05/29/2023 05/28/2022          O 2/5 - learning to use Olivia skills more consistently             Goal 3) Combine skills learned in therapy into a new daily approach to managing ADHD.  5 Point Likert rating baseline date: 05/28/2021  Target Date Goal Was reviewed Status Code Progress towards goal/Likert rating  05/29/2023 05/28/2022          O 2/5 - learning to use Olivia skills more consistently             This plan has been reviewed and created by the following participants:  This plan will be reviewed at least every 12 months. Date Behavioral Health Clinician Date Guardian/Patient   05/28/2022 [name], Ph.D.   05/28/2022 [name]                   Diagnosis: Major Depressive Disorder, recurrent, mild Generalized Anxiety Disorder Attention Deficit Disorder, predominantly inattentive   [name] reports that she f/t and made some calls to find specialist to consult for a second opinion.  She also spook to Olivia [name], [name] and Olivia [name].  We d/e/p what she said, how they all reacted, and next  steps.  We talked about the side effects (ie., nausea, throwing up, brain fog, lose of balance, migraines, depression) of Olivia parathyroid condition.  She got very sick and fell this weekend and wasn't able to attend Olivia nieces graduation.  These events brought up the situation with Olivia estranged daughter and she admitted that she "went to a dark place."    [name] and I d/p that a part of Olivia is very anxious (ie. TMJ or grinding Olivia teeth at night).  I instructed Olivia to listen to bilaternal stimulation sounds (EMDR) 3/day or more if she didn't feel a lessening of Olivia anxiety.  We d/ that it is ok to have some anxiety, but there isn't much she "actually" knows right now to be worried about and Olivia anxiety is likely worsening Olivia symptoms (throwing up).  She agreed to f/t with the exercises and focus on the present.   Home Practice: journal on weekly prompts   [name], PhD  Friend - Amy  Super Ego - Kathie Rhodes

## 2023-04-15 ENCOUNTER — Other Ambulatory Visit (HOSPITAL_COMMUNITY): Payer: Self-pay

## 2023-04-16 ENCOUNTER — Ambulatory Visit: Payer: 59 | Attending: Family Medicine | Admitting: Pharmacist

## 2023-04-16 ENCOUNTER — Other Ambulatory Visit: Payer: Self-pay

## 2023-04-16 ENCOUNTER — Other Ambulatory Visit (HOSPITAL_COMMUNITY): Payer: Self-pay

## 2023-04-16 DIAGNOSIS — Z79899 Other long term (current) drug therapy: Secondary | ICD-10-CM

## 2023-04-16 MED ORDER — TALTZ 80 MG/ML ~~LOC~~ SOAJ
80.0000 mg | SUBCUTANEOUS | 2 refills | Status: DC
  Filled 2023-04-16: qty 2, fill #0
  Filled 2023-04-17 (×2): qty 2, 28d supply, fill #0
  Filled 2023-05-07: qty 2, 28d supply, fill #1
  Filled 2023-06-10: qty 2, 28d supply, fill #2

## 2023-04-16 NOTE — Progress Notes (Signed)
   S: Patient presents today for review of their specialty medication.   Patient is currently taking Taltz for psoriasis. Patient is managed by Dr. Wallace Cullens for this.   Dosing: Psoriasis: SubQ: 160 mg once, followed by 80 mg at weeks 2, 4, 6, 8, 10, and 12, and then 80 mg every 4 weeks.   Adherence: confirms. Started ~3-4 weeks ago.  Efficacy: confirms. This medication is working quite well for her!   Monitoring:  S/sx infection: none Injection site reactions: some local irritation but nothing contraindicative at this time.  S/sx of IBD: none S/sx of hypersensitivity reaction: none  Current adverse effects: none   O:     Lab Results  Component Value Date   WBC 8.7 03/12/2023   HGB 15.0 03/12/2023   HCT 45.1 03/12/2023   MCV 85.4 03/12/2023   PLT 292.0 03/12/2023      Chemistry      Component Value Date/Time   NA 141 03/12/2023 1553   NA 140 01/28/2020 0000   K 3.9 03/12/2023 1553   CL 105 03/12/2023 1553   CO2 26 03/12/2023 1553   BUN 9 03/12/2023 1553   BUN 13 01/28/2020 0000   CREATININE 0.96 03/12/2023 1553   GLU 91 01/28/2020 0000      Component Value Date/Time   CALCIUM 9.3 03/12/2023 1553   ALKPHOS 50 03/12/2023 1553   AST 17 03/12/2023 1553   ALT 11 03/12/2023 1553   BILITOT 0.8 03/12/2023 1553   BILITOT 0.5 11/17/2018 0830       A/P: 1. Medication review: patient currently on Taltz for psoriasis. Reviewed the medication with the patient, including the following: Altamease Oiler is a medication used to treat ankylosing spondylitis, plaque psoriasis, and psoriatic arthritis. Subcutaneous: Allow to reach room temperature prior to injection (30 minutes). Do not shake. Inject full amount into the upper arms, thighs or any quadrant of the abdomen; administer each injection at a different anatomic location than a previous injection and avoid areas where the skin is tender, bruised, erythematous, indurated, or affected by psoriasis. Administration in the upper, outer arm  may be performed by a caregiver or health care provider. Ixekizumab is intended for use under the guidance and supervision of a physician; may be self-injected by the patient following proper training in SubQ injection technique. Possible adverse effects include neutropenia, antibody development, increased risk of infection, injection site reaction, hypersensitivity reactions, and inflammatory bowel disease. Avoid live vaccinations. No recommendations for any changes at this time.  Butch Penny, PharmD, Patsy Baltimore, CPP Clinical Pharmacist [facility] [phone]

## 2023-04-17 ENCOUNTER — Other Ambulatory Visit: Payer: Self-pay

## 2023-04-17 ENCOUNTER — Other Ambulatory Visit (HOSPITAL_COMMUNITY): Payer: Self-pay

## 2023-04-21 ENCOUNTER — Ambulatory Visit: Payer: 59 | Admitting: Psychology

## 2023-04-21 DIAGNOSIS — F320 Major depressive disorder, single episode, mild: Secondary | ICD-10-CM

## 2023-04-21 NOTE — Progress Notes (Unsigned)
PROGRESS NOTE:   Name: Olivia [name] Date: 04/21/2023 MRN: [id number] DOB: 1966/07/13 PCP: Willow Ora, MD  Time spent: 4:00 - 4:55 PM  Annual Review: 05/29/2023  Today I met with  [name]  in remote video (Caregility) face-to-face individual psychotherapy.  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 She is normally a high functioning and driven individual and it is a struggle to get motivated to do anything. If it is necessary for work she does it and does a good job, but it is not the same. She has gained 70 pounds in less than 9 months and it has impacted her self image. She is also peri menopausal and these hormonal changes are also likely to be contributing to her changing mood states.   A year and a half ago her father had quadrupel by-pass surgery and he was recently diagnosed with cancer. 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  Vada states that she was diagnosed with adult ADHD and responded well to medications.  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)   Son: Health visitor (15) he is on the ASD, he is a Holiday representative and attends [employer]   Mental Status Exam: Appearance:   Casual     Behavior:  Appropriate  Motor:  Normal  Speech/Language:   NA  Affect:  Appropriate  Mood:  normal  Thought process:  normal  Thought content:    WNL  Sensory/Perceptual disturbances:    WNL  Orientation:  oriented to person, place, time/date, situation, and day of week  Attention:  Good  Concentration:  Good  Memory:  WNL  Fund of knowledge:   Good  Insight:    Good  Judgment:   Good  Impulse Control:  Good    Individualized Treatment Plan                Strengths: bright,  verbal, motivated, self aware, self advocate   Supports: spouse, family, friends, colleagues   Goal/Needs for Treatment:  In order of importance to patient  1) Develop healthy interpersonal relationships that lead to the alleviation and help prevent the relapse of depression.  2) Learn and implement coping skills that result in a reduction of anxiety and worry, and improved functioning.   3) Combine skills learned in therapy into a new daily approach to managing ADHD.   Client Statement of Needs: requests assistance with piecing back together her life as it once was before she got depressed (burnt out).   Treatment Level: Weekly Individual Outpatient Psychotherapy.  Symptoms: autonomic hyperactivity (e.g., palpitations, shortness of breath, dry mouth, trouble swallowing, nausea, diarrhea).  Childhood history of Attention Deficit Disorder (ADD) that was either diagnosed or later concluded due to the symptoms of behavioral problems at school, impulsivity, temper outbursts, and lack of concentration.  Depressed or irritable mood. Diminished interest in or enjoyment of activities. Easily distracted and drawn from task at hand.  Excessive and/or unrealistic worry that is difficult to control occurring more days than not for at least 6 months about a number of events or activities.  Feelings of hopelessness, worthlessness, or inappropriate guilt.  Lack of energy.   Poor concentration and indecisiveness.  Restless and fidgety; unable to be  sedentary for more than a short time.   Client Treatment Preferences: Continue with current therapist   Healthcare consumer's goal for treatment:  Psychologist, [name], Ph.D. will support the patient's ability to achieve the goals identified. Cognitive Behavioral Therapy, Dialectical Behavioral Therapy, Motivational Interviewing, SPACE, parent training, and other evidenced-based practices will be used to promote progress towards healthy functioning.    Healthcare consumer Jatasia "[name]" Eyestone will: Actively participate in therapy, working towards healthy functioning.    *Justification for Continuation/Discontinuation of Goal: R=Revised, O=Ongoing, A=Achieved, D=Discontinued  Goal 1) Develop healthy interpersonal relationships that lead to the alleviation and help prevent the relapse of depression.  5 Point Likert rating baseline date: 05/28/2021 Target Date Goal Was reviewed Status Code Progress towards goal/Likert rating  05/29/2023 05/28/2022         O 3/5 - learning to set better limits and boundaries, still hasn't been able to regulate her daily routines             Goal 2) Learn and implement coping skills that result in a reduction of anxiety and worry, and improved daily functioning.  5 Point Likert rating baseline date: 05/28/2021  Target Date Goal Was reviewed Status Code Progress towards goal/Likert rating  05/29/2023 05/28/2022          O 2/5 - learning to use her skills more consistently             Goal 3) Combine skills learned in therapy into a new daily approach to managing ADHD.  5 Point Likert rating baseline date: 05/28/2021  Target Date Goal Was reviewed Status Code Progress towards goal/Likert rating  05/29/2023 05/28/2022          O 2/5 - learning to use her skills more consistently             This plan has been reviewed and created by the following participants:  This plan will be reviewed at least every 12 months. Date Behavioral Health Clinician Date Guardian/Patient   05/28/2022 [name], Ph.D.   05/28/2022 [name]                   Diagnosis: Major Depressive Disorder, recurrent, mild Generalized Anxiety Disorder Attention Deficit Disorder, predominantly inattentive   [name] reports that she   Home Practice: journal on weekly prompts   [name], PhD  Friend - Amy  Super Ego - Kathie Rhodes

## 2023-04-28 ENCOUNTER — Ambulatory Visit (INDEPENDENT_AMBULATORY_CARE_PROVIDER_SITE_OTHER): Payer: 59 | Admitting: Psychology

## 2023-04-28 DIAGNOSIS — F320 Major depressive disorder, single episode, mild: Secondary | ICD-10-CM

## 2023-04-28 NOTE — Progress Notes (Signed)
PROGRESS NOTE:   Name: Olivia [name] Date: 04/28/2023 MRN: [id number] DOB: 1966-02-07 PCP: Olivia Ora, MD  Time spent: 4:04- 5:00 PM  Annual Review: 05/29/2023  Today I met with  Olivia [name] in remote video (Caregility) face-to-face individual psychotherapy.  Distance Site: [name]: Dr Olivia [name] Remote Office Consent: Obtained verbal consent to transmit session remotely.  Patient is aware of the limitations inherent in participating in virtual therapy.   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 She is normally a high functioning and driven individual and it is a struggle to get motivated to do anything. If it is necessary for work she does it and does a good job, but it is not the same. She has gained 70 pounds in less than 9 months and it has impacted her self image. She is also peri menopausal and these hormonal changes are also likely to be contributing to her changing mood states.   A year and a half ago her father had quadrupel by-pass surgery and he was recently diagnosed with cancer. 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  Olivia [name] states that she was diagnosed with adult ADHD and responded well to medications.  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)   [name] he is on the ASD, he is a Holiday representative and attends [employer]   Mental Status Exam: Appearance:   Casual     Behavior:  Appropriate  Motor:  Normal  Speech/Language:   NA  Affect:  Appropriate  Mood:  normal  Thought process:  normal  Thought content:    WNL  Sensory/Perceptual disturbances:    WNL  Orientation:  oriented to person, place, time/date, situation, and day of week  Attention:  Good  Concentration:  Good  Memory:  WNL  Fund of knowledge:   Good  Insight:    Good  Judgment:   Good  Impulse  Control:  Good    Individualized Treatment Plan                Strengths: bright, verbal, motivated, self aware, self advocate   Supports: spouse, family, friends, colleagues   Goal/Needs for Treatment:  In order of importance to patient  1) Develop healthy interpersonal relationships that lead to the alleviation and help prevent the relapse of depression.  2) Learn and implement coping skills that result in a reduction of anxiety and worry, and improved functioning.   3) Combine skills learned in therapy into a new daily approach to managing ADHD.   Client Statement of Needs: requests assistance with piecing back together her life as it once was before she got depressed (burnt out).   Treatment Level: Weekly Individual Outpatient Psychotherapy.  Symptoms: autonomic hyperactivity (e.g., palpitations, shortness of breath, dry mouth, trouble swallowing, nausea, diarrhea).  Childhood history of Attention Deficit Disorder (ADD) that was either diagnosed or later concluded due to the symptoms of behavioral problems at school, impulsivity, temper outbursts, and lack of concentration.  Depressed or irritable mood. Diminished interest in or enjoyment of activities. Easily distracted and drawn from task at hand.  Excessive and/or unrealistic worry that is difficult to control occurring more days than not for at least 6 months about a number of events or activities.  Feelings of hopelessness, worthlessness, or inappropriate guilt.  Lack of energy.   Poor  concentration and indecisiveness.  Restless and fidgety; unable to be sedentary for more than a short time.   Client Treatment Preferences: Continue with current therapist   Healthcare consumer's goal for treatment:  Psychologist, Olivia [name], Ph.D. will support the patient's ability to achieve the goals identified. Cognitive Behavioral Therapy, Dialectical Behavioral Therapy, Motivational Interviewing, SPACE, parent training, and other  evidenced-based practices will be used to promote progress towards healthy functioning.   Healthcare consumer Olivia [name] will: Actively participate in therapy, working towards healthy functioning.    *Justification for Continuation/Discontinuation of Goal: R=Revised, O=Ongoing, A=Achieved, D=Discontinued  Goal 1) Develop healthy interpersonal relationships that lead to the alleviation and help prevent the relapse of depression.  5 Point Likert rating baseline date: 05/28/2021 Target Date Goal Was reviewed Status Code Progress towards goal/Likert rating  05/29/2023 05/28/2022         O 3/5 - learning to set better limits and boundaries, still hasn't been able to regulate her daily routines             Goal 2) Learn and implement coping skills that result in a reduction of anxiety and worry, and improved daily functioning.  5 Point Likert rating baseline date: 05/28/2021  Target Date Goal Was reviewed Status Code Progress towards goal/Likert rating  05/29/2023 05/28/2022          O 2/5 - learning to use her skills more consistently             Goal 3) Combine skills learned in therapy into a new daily approach to managing ADHD.  5 Point Likert rating baseline date: 05/28/2021  Target Date Goal Was reviewed Status Code Progress towards goal/Likert rating  05/29/2023 05/28/2022          O 2/5 - learning to use her skills more consistently             This plan has been reviewed and created by the following participants:  This plan will be reviewed at least every 12 months. Date Behavioral Olivia Clinician Date Guardian/Patient   05/28/2022 Olivia [name], Ph.D.   05/28/2022 Olivia [name]                   Diagnosis: Major Depressive Disorder, recurrent, mild Generalized Anxiety Disorder Attention Deficit Disorder, predominantly inattentive   Olivia [name] reports that she hasn't made a lot of progress on pursuing a 2nd opinion.  Her attention was diverted by Geographical information systems officer.  I noted that she was able to adjust her mental frame from a negative characterization to a more realistic point of view.  Olivia [name] states that her daughter's birthday is coming up at the end of the month.  It has her feeling sad, but more angry.  We d/e/p her feelings of angry, feeling guilty for feeling angry and her impossible expectations of herself as a mother.  We were able to challenge her unrealistic expectations and give her permission to have her feelings.   Home Practice: journal on weekly prompts   Olivia Favors, PhD  Friend - Olivia [name]  Super Ego - Olivia [name]

## 2023-05-05 ENCOUNTER — Ambulatory Visit (INDEPENDENT_AMBULATORY_CARE_PROVIDER_SITE_OTHER): Payer: 59 | Admitting: Psychology

## 2023-05-05 DIAGNOSIS — R7303 Prediabetes: Secondary | ICD-10-CM | POA: Diagnosis not present

## 2023-05-05 DIAGNOSIS — Z9889 Other specified postprocedural states: Secondary | ICD-10-CM | POA: Diagnosis not present

## 2023-05-05 DIAGNOSIS — F411 Generalized anxiety disorder: Secondary | ICD-10-CM

## 2023-05-05 DIAGNOSIS — F988 Other specified behavioral and emotional disorders with onset usually occurring in childhood and adolescence: Secondary | ICD-10-CM

## 2023-05-05 DIAGNOSIS — F330 Major depressive disorder, recurrent, mild: Secondary | ICD-10-CM

## 2023-05-05 DIAGNOSIS — F320 Major depressive disorder, single episode, mild: Secondary | ICD-10-CM

## 2023-05-05 DIAGNOSIS — E890 Postprocedural hypothyroidism: Secondary | ICD-10-CM | POA: Diagnosis not present

## 2023-05-05 NOTE — Progress Notes (Signed)
PROGRESS NOTE:   Name: Olivia [name] Date: 05/05/2023 MRN: [id number] DOB: [birth year] PCP: Willow Ora, MD  Time spent: 4:04- 5:00 PM  Annual Review: 05/29/2023  Today I met with  Olivia [name] in remote video (Caregility) face-to-face individual psychotherapy.  Distance Site: [name]: Dr Odette Horns Remote Office Consent: Obtained verbal consent to transmit session remotely.  Patient is aware of the limitations inherent in participating in virtual therapy.   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 She is normally a high functioning and driven individual and it is a struggle to get motivated to do anything. If it is necessary for work she does it and does a good job, but it is not the same. She has gained 70 pounds in less than 9 months and it has impacted her self image. She is also peri menopausal and these hormonal changes are also likely to be contributing to her changing mood states.   A year and a half ago her father had quadrupel by-pass surgery and he was recently diagnosed with cancer. 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  Olivia [name] states that she was diagnosed with adult ADHD and responded well to medications.  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)   [name] he is on the ASD, he is a Holiday representative and attends [employer]   Mental Status Exam: Appearance:   Casual     Behavior:  Appropriate  Motor:  Normal  Speech/Language:   NA  Affect:  Appropriate  Mood:  normal  Thought process:  normal  Thought content:    WNL  Sensory/Perceptual disturbances:    WNL  Orientation:  oriented to person, place, time/date, situation, and day of week  Attention:  Good  Concentration:  Good  Memory:  WNL  Fund of knowledge:   Good  Insight:    Good  Judgment:   Good  Impulse  Control:  Good    Individualized Treatment Plan                Strengths: bright, verbal, motivated, self aware, self advocate   Supports: spouse, family, friends, colleagues   Goal/Needs for Treatment:  In order of importance to patient  1) Develop healthy interpersonal relationships that lead to the alleviation and help prevent the relapse of depression.  2) Learn and implement coping skills that result in a reduction of anxiety and worry, and improved functioning.   3) Combine skills learned in therapy into a new daily approach to managing ADHD.   Client Statement of Needs: requests assistance with piecing back together her life as it once was before she got depressed (burnt out).   Treatment Level: Weekly Individual Outpatient Psychotherapy.  Symptoms: autonomic hyperactivity (e.g., palpitations, shortness of breath, dry mouth, trouble swallowing, nausea, diarrhea).  Childhood history of Attention Deficit Disorder (ADD) that was either diagnosed or later concluded due to the symptoms of behavioral problems at school, impulsivity, temper outbursts, and lack of concentration.  Depressed or irritable mood. Diminished interest in or enjoyment of activities. Easily distracted and drawn from task at hand.  Excessive and/or unrealistic worry that is difficult to control occurring more days than not for at least 6 months about a number of events or activities.  Feelings of hopelessness, worthlessness, or inappropriate guilt.  Lack of energy.   Poor  concentration and indecisiveness.  Restless and fidgety; unable to be sedentary for more than a short time.   Client Treatment Preferences: Continue with current therapist   Healthcare consumer's goal for treatment:  Psychologist, [name], Ph.D. will support the patient's ability to achieve the goals identified. Cognitive Behavioral Therapy, Dialectical Behavioral Therapy, Motivational Interviewing, SPACE, parent training, and other  evidenced-based practices will be used to promote progress towards healthy functioning.   Healthcare consumer Olivia [name] will: Actively participate in therapy, working towards healthy functioning.    *Justification for Continuation/Discontinuation of Goal: R=Revised, O=Ongoing, A=Achieved, D=Discontinued  Goal 1) Develop healthy interpersonal relationships that lead to the alleviation and help prevent the relapse of depression.  5 Point Likert rating baseline date: 05/28/2021 Target Date Goal Was reviewed Status Code Progress towards goal/Likert rating  05/29/2023 05/28/2022         O 3/5 - learning to set better limits and boundaries, still hasn't been able to regulate her daily routines             Goal 2) Learn and implement coping skills that result in a reduction of anxiety and worry, and improved daily functioning.  5 Point Likert rating baseline date: 05/28/2021  Target Date Goal Was reviewed Status Code Progress towards goal/Likert rating  05/29/2023 05/28/2022          O 2/5 - learning to use her skills more consistently             Goal 3) Combine skills learned in therapy into a new daily approach to managing ADHD.  5 Point Likert rating baseline date: 05/28/2021  Target Date Goal Was reviewed Status Code Progress towards goal/Likert rating  05/29/2023 05/28/2022          O 2/5 - learning to use her skills more consistently             This plan has been reviewed and created by the following participants:  This plan will be reviewed at least every 12 months. Date Behavioral Olivia Clinician Date Guardian/Patient   05/28/2022 [name], Ph.D.   05/28/2022 Olivia [name]                   Diagnosis: Major Depressive Disorder, recurrent, mild Generalized Anxiety Disorder Attention Deficit Disorder, predominantly inattentive   Olivia [name] reports that she has been sleeping too much.  We d/ that this is related to her parathyroid, she is doing what she  needs to do, and agreed to find a new provider.  Burgess Estelle was her daughter's birthday and it's now been two years since she last spoke to her.  We d/p her mixed feelings related to the difficulties she has had with her daughter in the past and coming to terms with her inappropriate guilt.   Home Practice: journal on weekly prompts   [name], PhD  Friend - Amy  Super Ego - Kathie Rhodes

## 2023-05-07 ENCOUNTER — Other Ambulatory Visit (HOSPITAL_COMMUNITY): Payer: Self-pay

## 2023-05-09 ENCOUNTER — Other Ambulatory Visit (HOSPITAL_COMMUNITY): Payer: Self-pay

## 2023-05-12 ENCOUNTER — Ambulatory Visit: Payer: No Typology Code available for payment source | Admitting: Psychology

## 2023-05-14 ENCOUNTER — Ambulatory Visit (INDEPENDENT_AMBULATORY_CARE_PROVIDER_SITE_OTHER): Payer: 59 | Admitting: Psychology

## 2023-05-14 DIAGNOSIS — F320 Major depressive disorder, single episode, mild: Secondary | ICD-10-CM

## 2023-05-14 DIAGNOSIS — F411 Generalized anxiety disorder: Secondary | ICD-10-CM | POA: Diagnosis not present

## 2023-05-14 DIAGNOSIS — F900 Attention-deficit hyperactivity disorder, predominantly inattentive type: Secondary | ICD-10-CM

## 2023-05-14 NOTE — Progress Notes (Signed)
PROGRESS NOTE:   Name: Olivia [name] Date: 05/14/2023 MRN: [id number] DOB: 1966-10-04 PCP: Willow Ora, MD  Time spent: 3:02- 3:59 PM  Annual Review: 05/29/2023  Today I met with  Olivia [name] in remote video (Caregility) face-to-face individual psychotherapy.  Distance Site: [name]: Dr Odette Horns Remote Office Consent: Obtained verbal consent to transmit session remotely.   Patient is aware of the limitations inherent in participating in virtual therapy.   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 She is normally a high functioning and driven individual and it is a struggle to get motivated to do anything. If it is necessary for work she does it and does a good job, but it is not the same. She has gained 70 pounds in less than 9 months and it has impacted her self image. She is also peri menopausal and these hormonal changes are also likely to be contributing to her changing mood states.   A year and a half ago her father had quadrupel by-pass surgery and he was recently diagnosed with cancer. 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  Olivia [name] states that she was diagnosed with adult ADHD and responded well to medications.  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)   [name] he is on the ASD, he is a Holiday representative and attends [employer]   Mental Status Exam: Appearance:   Casual     Behavior:  Appropriate  Motor:  Normal  Speech/Language:   NA  Affect:  Appropriate  Mood:  normal  Thought process:  normal  Thought content:    WNL  Sensory/Perceptual disturbances:    WNL  Orientation:  oriented to person, place, time/date, situation, and day of week  Attention:  Good  Concentration:  Good  Memory:  WNL  Fund of knowledge:   Good  Insight:    Good  Judgment:   Good  Impulse  Control:  Good    Individualized Treatment Plan                Strengths: bright, verbal, motivated, self aware, self advocate   Supports: spouse, family, friends, colleagues   Goal/Needs for Treatment:  In order of importance to patient  1) Develop healthy interpersonal relationships that lead to the alleviation and help prevent the relapse of depression.  2) Learn and implement coping skills that result in a reduction of anxiety and worry, and improved functioning.   3) Combine skills learned in therapy into a new daily approach to managing ADHD.   Client Statement of Needs: requests assistance with piecing back together her life as it once was before she got depressed (burnt out).   Treatment Level: Weekly Individual Outpatient Psychotherapy.  Symptoms: autonomic hyperactivity (e.g., palpitations, shortness of breath, dry mouth, trouble swallowing, nausea, diarrhea).  Childhood history of Attention Deficit Disorder (ADD) that was either diagnosed or later concluded due to the symptoms of behavioral problems at school, impulsivity, temper outbursts, and lack of concentration.  Depressed or irritable mood. Diminished interest in or enjoyment of activities. Easily distracted and drawn from task at hand.  Excessive and/or unrealistic worry that is difficult to control occurring more days than not for at least 6 months about a number of events or activities.  Feelings of hopelessness, worthlessness, or inappropriate guilt.  Lack of energy.  Poor concentration and indecisiveness.  Restless and fidgety; unable to be sedentary for more than a short time.   Client Treatment Preferences: Continue with current therapist   Healthcare consumer's goal for treatment:  Psychologist, Olivia [name], Ph.D. will support the patient's ability to achieve the goals identified. Cognitive Behavioral Therapy, Dialectical Behavioral Therapy, Motivational Interviewing, SPACE, parent training, and other  evidenced-based practices will be used to promote progress towards healthy functioning.   Healthcare consumer Olivia [name] will: Actively participate in therapy, working towards healthy functioning.    *Justification for Continuation/Discontinuation of Goal: R=Revised, O=Ongoing, A=Achieved, D=Discontinued  Goal 1) Develop healthy interpersonal relationships that lead to the alleviation and help prevent the relapse of depression.  5 Point Likert rating baseline date: 05/28/2021 Target Date Goal Was reviewed Status Code Progress towards goal/Likert rating  05/29/2023 05/28/2022         O 3/5 - learning to set better limits and boundaries, still hasn't been able to regulate her daily routines             Goal 2) Learn and implement coping skills that result in a reduction of anxiety and worry, and improved daily functioning.  5 Point Likert rating baseline date: 05/28/2021  Target Date Goal Was reviewed Status Code Progress towards goal/Likert rating  05/29/2023 05/28/2022          O 2/5 - learning to use her skills more consistently             Goal 3) Combine skills learned in therapy into a new daily approach to managing ADHD.  5 Point Likert rating baseline date: 05/28/2021  Target Date Goal Was reviewed Status Code Progress towards goal/Likert rating  05/29/2023 05/28/2022          O 2/5 - learning to use her skills more consistently             This plan has been reviewed and created by the following participants:  This plan will be reviewed at least every 12 months. Date Behavioral Olivia Clinician Date Guardian/Patient   05/28/2022 Olivia [name], Ph.D.   05/28/2022 Olivia [name]                   Diagnosis: Major Depressive Disorder, recurrent, mild Generalized Anxiety Disorder Attention Deficit Disorder, predominantly inattentive   Olivia [name] reports that she continues not to feel well.  She is constantly fight nausea, heart palpitations, an uncomfortably  flushed face and fatigue.  Sleep has however improved.  We d/ and p/s how to manage everyday tasks and work while fighting nausea and her other symptoms.  Once Olivia [name] receives her lab results next week, she'll be in a better position to request a second opinion,  Lastly, we d/ a couple issues she is contending with related to her son who is on the autism spectrum.  I provided the support and guidance needed to better address these challenges.  Home Practice: journal on weekly prompts   Olivia Favors, PhD  Friend - Olivia [name]  Super Ego - Olivia [name]

## 2023-05-19 ENCOUNTER — Ambulatory Visit: Payer: 59 | Admitting: Psychology

## 2023-05-19 DIAGNOSIS — R7303 Prediabetes: Secondary | ICD-10-CM | POA: Diagnosis not present

## 2023-05-19 DIAGNOSIS — F411 Generalized anxiety disorder: Secondary | ICD-10-CM | POA: Diagnosis not present

## 2023-05-19 DIAGNOSIS — E890 Postprocedural hypothyroidism: Secondary | ICD-10-CM | POA: Diagnosis not present

## 2023-05-19 DIAGNOSIS — F900 Attention-deficit hyperactivity disorder, predominantly inattentive type: Secondary | ICD-10-CM

## 2023-05-19 DIAGNOSIS — Z8639 Personal history of other endocrine, nutritional and metabolic disease: Secondary | ICD-10-CM | POA: Diagnosis not present

## 2023-05-19 DIAGNOSIS — F320 Major depressive disorder, single episode, mild: Secondary | ICD-10-CM | POA: Diagnosis not present

## 2023-05-19 DIAGNOSIS — Z9889 Other specified postprocedural states: Secondary | ICD-10-CM | POA: Diagnosis not present

## 2023-05-19 NOTE — Progress Notes (Signed)
PROGRESS NOTE:   Name: Olivia [name] Date: 05/19/2023 MRN: [id number] DOB: 02-17-1966 PCP: Olivia Ora, MD  Time spent: 3:02- 3:59 PM  Annual Review: 05/29/2023  Today I met with  Olivia [name] in remote video (Caregility) face-to-face individual psychotherapy.  Distance Site: [name]: Dr Olivia [name] Remote Office Consent: Obtained verbal consent to transmit session remotely.   Patient is aware of the limitations inherent in participating in virtual therapy.   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 She is normally a high functioning and driven individual and it is a struggle to get motivated to do anything. If it is necessary for work she does it and does a good job, but it is not the same. She has gained 70 pounds in less than 9 months and it has impacted her self image. She is also peri menopausal and these hormonal changes are also likely to be contributing to her changing mood [name].   A year and a half ago her father had quadrupel by-pass surgery and he was recently diagnosed with cancer. 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  Olivia [name] that she was diagnosed with adult ADHD and responded well to medications.  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)   [name] he is on the ASD, he is a Holiday representative and attends [employer]   Mental Status Exam: Appearance:   Casual     Behavior:  Appropriate  Motor:  Normal  Speech/Language:   NA  Affect:  Appropriate  Mood:  normal  Thought process:  normal  Thought content:    WNL  Sensory/Perceptual disturbances:    WNL  Orientation:  oriented to person, place, time/date, situation, and day of week  Attention:  Good  Concentration:  Good  Memory:  WNL  Fund of knowledge:   Good  Insight:    Good  Judgment:   Good  Impulse  Control:  Good    Individualized Treatment Plan                Strengths: bright, verbal, motivated, self aware, self advocate   Supports: spouse, family, friends, colleagues   Goal/Needs for Treatment:  In order of importance to patient  1) Develop healthy interpersonal relationships that lead to the alleviation and help prevent the relapse of depression.  2) Learn and implement coping skills that result in a reduction of anxiety and worry, and improved functioning.   3) Combine skills learned in therapy into a new daily approach to managing ADHD.   Client Statement of Needs: requests assistance with piecing back together her life as it once was before she got depressed (burnt out).   Treatment Level: Weekly Individual Outpatient Psychotherapy.  Symptoms: autonomic hyperactivity (e.g., palpitations, shortness of breath, dry mouth, trouble swallowing, nausea, diarrhea).  Childhood history of Attention Deficit Disorder (ADD) that was either diagnosed or later concluded due to the symptoms of behavioral problems at school, impulsivity, temper outbursts, and lack of concentration.  Depressed or irritable mood. Diminished interest in or enjoyment of activities. Easily distracted and drawn from task at hand.  Excessive and/or unrealistic worry that is difficult to control occurring more days than not for at least 6 months about a number of events or activities.  Feelings of hopelessness, worthlessness, or inappropriate guilt.  Lack of energy.  Poor concentration and indecisiveness.  Restless and fidgety; unable to be sedentary for more than a short time.   Client Treatment Preferences: Continue with current therapist   Healthcare consumer's goal for treatment:  Psychologist, Olivia [name], Ph.D. will support the patient's ability to achieve the goals identified. Cognitive Behavioral Therapy, Dialectical Behavioral Therapy, Motivational Interviewing, SPACE, parent training, and other  evidenced-based practices will be used to promote progress towards healthy functioning.   Healthcare consumer Olivia "Olivia [name]" Olivia [name] will: Actively participate in therapy, working towards healthy functioning.    *Justification for Continuation/Discontinuation of Goal: R=Revised, O=Ongoing, A=Achieved, D=Discontinued  Goal 1) Develop healthy interpersonal relationships that lead to the alleviation and help prevent the relapse of depression.  5 Point Likert rating baseline date: 05/28/2021 Target Date Goal Was reviewed Status Code Progress towards goal/Likert rating  05/29/2023 05/28/2022         O 3/5 - learning to set better limits and boundaries, still hasn't been able to regulate her daily routines             Goal 2) Learn and implement coping skills that result in a reduction of anxiety and worry, and improved daily functioning.  5 Point Likert rating baseline date: 05/28/2021  Target Date Goal Was reviewed Status Code Progress towards goal/Likert rating  05/29/2023 05/28/2022          O 2/5 - learning to use her skills more consistently             Goal 3) Combine skills learned in therapy into a new daily approach to managing ADHD.  5 Point Likert rating baseline date: 05/28/2021  Target Date Goal Was reviewed Status Code Progress towards goal/Likert rating  05/29/2023 05/28/2022          O 2/5 - learning to use her skills more consistently             This plan has been reviewed and created by the following participants:  This plan will be reviewed at least every 12 months. Date Behavioral Olivia Clinician Date Guardian/Patient   05/28/2022 Olivia [name], Ph.D.   05/28/2022 Olivia [name]" Olivia [name]                   Diagnosis: Major Depressive Disorder, recurrent, mild Generalized Anxiety Disorder Attention Deficit Disorder, predominantly inattentive   Olivia [name] reports that she prioritized quality sleep and was feeling better.  We d/p other physical changes that have made her  feel and sleep better.    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  We d/p how she is learning how to resist jumping in and "fix it."  I provided some guidance and support around reframing her role in these situations and to better manage her negative self talk.   Home Practice: journal on weekly prompts   Olivia Favors, PhD  Friend - Amy  Super Ego - Kathie Rhodes

## 2023-05-26 ENCOUNTER — Ambulatory Visit (INDEPENDENT_AMBULATORY_CARE_PROVIDER_SITE_OTHER): Payer: 59 | Admitting: Psychology

## 2023-05-26 DIAGNOSIS — F411 Generalized anxiety disorder: Secondary | ICD-10-CM

## 2023-05-26 DIAGNOSIS — F320 Major depressive disorder, single episode, mild: Secondary | ICD-10-CM | POA: Diagnosis not present

## 2023-05-26 DIAGNOSIS — F900 Attention-deficit hyperactivity disorder, predominantly inattentive type: Secondary | ICD-10-CM | POA: Diagnosis not present

## 2023-05-26 NOTE — Progress Notes (Signed)
PROGRESS NOTE: Annual Review   Name: ROSAMOND ANDRESS Date: 05/26/2023 MRN: [id number] DOB: [birth year] PCP: Willow Ora, MD  Time spent: 4:098- 4:02 PM  Annual Review: 05/25/2024  Today I met with  [name] in remote video (Caregility) face-to-face individual psychotherapy.  Distance Site: [name]: Dr Odette Horns Remote Office Consent: Obtained verbal consent to transmit session remotely.   Patient is aware of the limitations inherent in participating in virtual therapy.   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 She is normally a high functioning and driven individual and it is a struggle to get motivated to do anything. If it is necessary for work she does it and does a good job, but it is not the same. She has gained 70 pounds in less than 9 months and it has impacted her self image. She is also peri menopausal and these hormonal changes are also likely to be contributing to her changing mood states.   A year and a half ago her father had quadrupel by-pass surgery and he was recently diagnosed with cancer. 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  Lauralynn states that she was diagnosed with adult ADHD and responded well to medications.  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)   Son: Health visitor (15) he is on the ASD, he is a Holiday representative and attends [employer]   Mental Status Exam: Appearance:   Casual     Behavior:  Appropriate  Motor:  Normal  Speech/Language:   NA  Affect:  Appropriate  Mood:  normal  Thought process:  normal  Thought content:    WNL  Sensory/Perceptual disturbances:    WNL  Orientation:  oriented to person, place, time/date, situation, and day of week  Attention:  Good  Concentration:  Good  Memory:  WNL  Fund of knowledge:   Good  Insight:    Good  Judgment:   Good   Impulse Control:  Good    Individualized Treatment Plan                Strengths: bright, verbal, motivated, self aware, self advocate   Supports: spouse, family, friends, colleagues   Goal/Needs for Treatment:  In order of importance to patient  1) Develop healthy interpersonal relationships that lead to the alleviation and help prevent the relapse of depression.  2) Learn and implement coping skills that result in a reduction of anxiety and worry, and improved functioning.   3) Combine skills learned in therapy into a new daily approach to managing ADHD.   Client Statement of Needs: requests assistance with piecing back together her life as it once was before she got depressed (burnt out).   Treatment Level: Weekly Individual Outpatient Psychotherapy.  Symptoms: autonomic hyperactivity (e.g., palpitations, shortness of breath, dry mouth, trouble swallowing, nausea, diarrhea).  Childhood history of Attention Deficit Disorder (ADD) that was either diagnosed or later concluded due to the symptoms of behavioral problems at school, impulsivity, temper outbursts, and lack of concentration.  Depressed or irritable mood. Diminished interest in or enjoyment of activities. Easily distracted and drawn from task at hand.  Excessive and/or unrealistic worry that is difficult to control occurring more days than not for at least 6 months about a number of events or activities.  Feelings of hopelessness, worthlessness, or inappropriate guilt.  Lack of energy.  Poor concentration and indecisiveness.  Restless and fidgety; unable to be sedentary for more than a short time.   Client Treatment Preferences: Continue with current therapist   Healthcare consumer's goal for treatment:  Psychologist, [name], Ph.D. will support the patient's ability to achieve the goals identified. Cognitive Behavioral Therapy, Dialectical Behavioral Therapy, Motivational Interviewing, SPACE, parent training,  and other evidenced-based practices will be used to promote progress towards healthy functioning.   Healthcare consumer Gudelia "[name]" Priestly will: Actively participate in therapy, working towards healthy functioning.    *Justification for Continuation/Discontinuation of Goal: R=Revised, O=Ongoing, A=Achieved, D=Discontinued  Goal 1) Develop healthy interpersonal relationships that lead to the alleviation and help prevent the relapse of depression.  5 Point Likert rating baseline date: 05/28/2021 Target Date Goal Was reviewed Status Code Progress towards goal/Likert rating  05/29/2023 05/28/2022         O 3/5 - learning to set better limits and boundaries, still hasn't been able to regulate her daily routines  05/25/2024 05/26/2023         O        3.75 - pt reestablished friendship but has not be consistent in reaching out, has improved her relationship with her parents with appropriate limits.  Is more accepting of her relationship with her estranged daughter as healthy for her and not a failure        Goal 2) Learn and implement coping skills that result in a reduction of anxiety and worry, and improved daily functioning.  5 Point Likert rating baseline date: 05/28/2021  Target Date Goal Was reviewed Status Code Progress towards goal/Likert rating  05/29/2023 05/28/2022          O 2/5 - learning to use her skills more consistently  05/25/2024 05/26/2023          O 3.5 - pt is learning to use her skills and better manage her unrealistic and perfectionist tendencies that keep her stressed and anxious        Goal 3) Combine skills learned in therapy into a new daily approach to managing ADHD.  5 Point Likert rating baseline date: 05/28/2021  Target Date Goal Was reviewed Status Code Progress towards goal/Likert rating  05/29/2023 05/28/2022          O 2/5 - learning to use her skills more consistently  05/25/2024 05/26/2023          O 3.75 - accepted the need and began medication, has been more aware,  recognize, acknowledge and accept the issues related ADHD and is better able to apply strategies. Pt is kinder to herself around her "limitations" or need for an alternative way of approaching tasks        This plan has been reviewed and created by the following participants:  This plan will be reviewed at least every 12 months. Date Behavioral Health Clinician Date Guardian/Patient   05/28/2022 [name], Ph.D.   05/28/2022 [name]  05/26/2023 [name], Ph.D.  05/26/2023 Santina Evans "[name]" Andrey Farmer              Diagnosis: Major Depressive Disorder, recurrent, mild Generalized Anxiety Disorder Attention Deficit Disorder, predominantly inattentive   In session today, conducted pt's annual review.  We reviewed Cathy's progress, d/ goals and updated her treatment plan.  She actively participated in the creation of her treatment plan and freely gave her consent.  [name] reports that she was feeling very tired this week.  We d/e/p how she has been  feeling, the importance of self care and when to ask for support.  We d/ that her husband is very support but he has been exhausted from work this past week.  Cathy admitted to not getting enough done during the week, trying not to "beat herself up" for it, but not knowing how to challenge these thoughts.  I provided the support and guidance needed to create challenge and coping statements.  It has also been tiring because of what is happening politically in the [place]  Home Practice: journal on weekly prompts   [name], PhD  Friend - Amy  Super Ego - Kathie Rhodes

## 2023-06-02 ENCOUNTER — Ambulatory Visit: Payer: 59 | Admitting: Psychology

## 2023-06-02 DIAGNOSIS — F411 Generalized anxiety disorder: Secondary | ICD-10-CM

## 2023-06-02 DIAGNOSIS — F900 Attention-deficit hyperactivity disorder, predominantly inattentive type: Secondary | ICD-10-CM | POA: Diagnosis not present

## 2023-06-02 DIAGNOSIS — F330 Major depressive disorder, recurrent, mild: Secondary | ICD-10-CM

## 2023-06-02 NOTE — Progress Notes (Unsigned)
PROGRESS NOTE:    Name: Olivia [name] Date: 06/02/2023 MRN: [id number] DOB: 1965-11-14 PCP: Willow Ora, MD  Time spent: 3:03- 4:02 PM  Annual Review: 05/25/2024  Today I met with  Olivia [name] in remote video (Caregility) face-to-face individual psychotherapy.  Distance Site: [name]: Dr Odette Horns Remote Office Consent: Obtained verbal consent to transmit session remotely.   Patient is aware of the limitations inherent in participating in virtual therapy.   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 She is normally a high functioning and driven individual and it is a struggle to get motivated to do anything. If it is necessary for work she does it and does a good job, but it is not the same. She has gained 70 pounds in less than 9 months and it has impacted her self image. She is also peri menopausal and these hormonal changes are also likely to be contributing to her changing mood states.   A year and a half ago her father had quadrupel by-pass surgery and he was recently diagnosed with cancer. 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  Olivia [name] states that she was diagnosed with adult ADHD and responded well to medications.  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)   [name] he is on the ASD, he is a Holiday representative and attends [employer]   Mental Status Exam: Appearance:   Casual     Behavior:  Appropriate  Motor:  Normal  Speech/Language:   NA  Affect:  Appropriate  Mood:  normal  Thought process:  normal  Thought content:    WNL  Sensory/Perceptual disturbances:    WNL  Orientation:  oriented to person, place, time/date, situation, and day of week  Attention:  Good  Concentration:  Good  Memory:  WNL  Fund of knowledge:   Good  Insight:    Good  Judgment:   Good  Impulse  Control:  Good    Individualized Treatment Plan                Strengths: bright, verbal, motivated, self aware, self advocate   Supports: spouse, family, friends, colleagues   Goal/Needs for Treatment:  In order of importance to patient  1) Develop healthy interpersonal relationships that lead to the alleviation and help prevent the relapse of depression.  2) Learn and implement coping skills that result in a reduction of anxiety and worry, and improved functioning.   3) Combine skills learned in therapy into a new daily approach to managing ADHD.   Client Statement of Needs: requests assistance with piecing back together her life as it once was before she got depressed (burnt out).   Treatment Level: Weekly Individual Outpatient Psychotherapy.  Symptoms: autonomic hyperactivity (e.g., palpitations, shortness of breath, dry mouth, trouble swallowing, nausea, diarrhea).  Childhood history of Attention Deficit Disorder (ADD) that was either diagnosed or later concluded due to the symptoms of behavioral problems at school, impulsivity, temper outbursts, and lack of concentration.  Depressed or irritable mood. Diminished interest in or enjoyment of activities. Easily distracted and drawn from task at hand.  Excessive and/or unrealistic worry that is difficult to control occurring more days than not for at least 6 months about a number of events or activities.  Feelings of hopelessness, worthlessness, or inappropriate guilt.  Lack of energy.  Poor concentration and indecisiveness.  Restless and fidgety; unable to be sedentary for more than a short time.   Client Treatment Preferences: Continue with current therapist   Healthcare consumer's goal for treatment:  Psychologist, Olivia [name], Ph.D. will support the patient's ability to achieve the goals identified. Cognitive Behavioral Therapy, Dialectical Behavioral Therapy, Motivational Interviewing, SPACE, parent training, and other  evidenced-based practices will be used to promote progress towards healthy functioning.   Healthcare consumer Olivia [name] will: Actively participate in therapy, working towards healthy functioning.    *Justification for Continuation/Discontinuation of Goal: R=Revised, O=Ongoing, A=Achieved, D=Discontinued  Goal 1) Develop healthy interpersonal relationships that lead to the alleviation and help prevent the relapse of depression.  5 Point Likert rating baseline date: 05/28/2021 Target Date Goal Was reviewed Status Code Progress towards goal/Likert rating  05/29/2023 05/28/2022         O 3/5 - learning to set better limits and boundaries, still hasn't been able to regulate her daily routines  05/25/2024 05/26/2023         O        3.75 - pt reestablished friendship but has not be consistent in reaching out, has improved her relationship with her parents with appropriate limits.  Is more accepting of her relationship with her estranged daughter as healthy for her and not a failure        Goal 2) Learn and implement coping skills that result in a reduction of anxiety and worry, and improved daily functioning.  5 Point Likert rating baseline date: 05/28/2021  Target Date Goal Was reviewed Status Code Progress towards goal/Likert rating  05/29/2023 05/28/2022          O 2/5 - learning to use her skills more consistently  05/25/2024 05/26/2023          O 3.5 - pt is learning to use her skills and better manage her unrealistic and perfectionist tendencies that keep her stressed and anxious        Goal 3) Combine skills learned in therapy into a new daily approach to managing ADHD.  5 Point Likert rating baseline date: 05/28/2021  Target Date Goal Was reviewed Status Code Progress towards goal/Likert rating  05/29/2023 05/28/2022          O 2/5 - learning to use her skills more consistently  05/25/2024 05/26/2023          O 3.75 - accepted the need and began medication, has been more aware, recognize,  acknowledge and accept the issues related ADHD and is better able to apply strategies. Pt is kinder to herself around her "limitations" or need for an alternative way of approaching tasks        This plan has been reviewed and created by the following participants:  This plan will be reviewed at least every 12 months. Date Behavioral Olivia Clinician Date Guardian/Patient   05/28/2022 Olivia [name], Ph.D.   05/28/2022 Olivia [name]  05/26/2023 Olivia [name], Ph.D.  05/26/2023 Olivia [name]              Diagnosis: Major Depressive Disorder, recurrent, mild Generalized Anxiety Disorder Attention Deficit Disorder, predominantly inattentive    [name] reports that she tried to focus on being kinder to herself.  We d/e/p the ways she found herself making demands on herself ("Shoulds"), while not cruel, leaves her feeling like a failure.  I provided the support and guidance she needed as well as some new strategies for overcoming obstacles.  Home Practice: journal on weekly prompts   Olivia Favors, PhD  Friend - Amy  Super Ego - Kathie Rhodes

## 2023-06-03 ENCOUNTER — Other Ambulatory Visit (HOSPITAL_BASED_OUTPATIENT_CLINIC_OR_DEPARTMENT_OTHER): Payer: Self-pay

## 2023-06-03 DIAGNOSIS — L409 Psoriasis, unspecified: Secondary | ICD-10-CM | POA: Diagnosis not present

## 2023-06-03 DIAGNOSIS — L719 Rosacea, unspecified: Secondary | ICD-10-CM | POA: Diagnosis not present

## 2023-06-03 DIAGNOSIS — Z79899 Other long term (current) drug therapy: Secondary | ICD-10-CM | POA: Diagnosis not present

## 2023-06-03 MED ORDER — METRONIDAZOLE 0.75 % EX CREA
1.0000 | TOPICAL_CREAM | Freq: Two times a day (BID) | CUTANEOUS | 1 refills | Status: AC
  Filled 2023-06-03: qty 45, 30d supply, fill #0
  Filled 2023-06-18: qty 45, 23d supply, fill #0

## 2023-06-04 ENCOUNTER — Other Ambulatory Visit (HOSPITAL_COMMUNITY): Payer: Self-pay

## 2023-06-09 ENCOUNTER — Ambulatory Visit: Payer: 59 | Admitting: Psychology

## 2023-06-09 DIAGNOSIS — F330 Major depressive disorder, recurrent, mild: Secondary | ICD-10-CM

## 2023-06-09 DIAGNOSIS — F900 Attention-deficit hyperactivity disorder, predominantly inattentive type: Secondary | ICD-10-CM | POA: Diagnosis not present

## 2023-06-09 DIAGNOSIS — F411 Generalized anxiety disorder: Secondary | ICD-10-CM | POA: Diagnosis not present

## 2023-06-09 NOTE — Progress Notes (Signed)
PROGRESS NOTE:    Name: Olivia [name] Date: 06/09/2023 MRN: [id number] DOB: [birth year] PCP: Willow Ora, MD  Time spent: 3:03- 4:02 PM  Annual Review: 05/25/2024  Today I met with  Olivia [name] in remote video (Caregility) face-to-face individual psychotherapy.  Distance Site: [name]: Dr Odette Horns Remote Office Consent: Obtained verbal consent to transmit session remotely.   Patient is aware of the limitations inherent in participating in virtual therapy.   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 She is normally a high functioning and driven individual and it is a struggle to get motivated to do anything. If it is necessary for work she does it and does a good job, but it is not the same. She has gained 70 pounds in less than 9 months and it has impacted her self image. She is also peri menopausal and these hormonal changes are also likely to be contributing to her changing mood states.   A year and a half ago her father had quadrupel by-pass surgery and he was recently diagnosed with cancer. 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  Olivia [name] states that she was diagnosed with adult ADHD and responded well to medications.  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)   [name] he is on the ASD, he is a Holiday representative and attends [employer]   Mental Status Exam: Appearance:   Casual     Behavior:  Appropriate  Motor:  Normal  Speech/Language:   NA  Affect:  Appropriate  Mood:  normal  Thought process:  normal  Thought content:    WNL  Sensory/Perceptual disturbances:    WNL  Orientation:  oriented to person, place, time/date, situation, and day of week  Attention:  Good  Concentration:  Good  Memory:  WNL  Fund of knowledge:   Good  Insight:    Good  Judgment:   Good  Impulse  Control:  Good    Individualized Treatment Plan                Strengths: bright, verbal, motivated, self aware, self advocate   Supports: spouse, family, friends, colleagues   Goal/Needs for Treatment:  In order of importance to patient  1) Develop healthy interpersonal relationships that lead to the alleviation and help prevent the relapse of depression.  2) Learn and implement coping skills that result in a reduction of anxiety and worry, and improved functioning.   3) Combine skills learned in therapy into a new daily approach to managing ADHD.   Client Statement of Needs: requests assistance with piecing back together her life as it once was before she got depressed (burnt out).   Treatment Level: Weekly Individual Outpatient Psychotherapy.  Symptoms: autonomic hyperactivity (e.g., palpitations, shortness of breath, dry mouth, trouble swallowing, nausea, diarrhea).  Childhood history of Attention Deficit Disorder (ADD) that was either diagnosed or later concluded due to the symptoms of behavioral problems at school, impulsivity, temper outbursts, and lack of concentration.  Depressed or irritable mood. Diminished interest in or enjoyment of activities. Easily distracted and drawn from task at hand.  Excessive and/or unrealistic worry that is difficult to control occurring more days than not for at least 6 months about a number of events or activities.  Feelings of hopelessness, worthlessness, or inappropriate guilt.  Lack of energy.  Poor concentration and indecisiveness.  Restless and fidgety; unable to be sedentary for more than a short time.   Client Treatment Preferences: Continue with current therapist   Healthcare consumer's goal for treatment:  Psychologist, [name], Ph.D. will support the patient's ability to achieve the goals identified. Cognitive Behavioral Therapy, Dialectical Behavioral Therapy, Motivational Interviewing, SPACE, parent training, and other  evidenced-based practices will be used to promote progress towards healthy functioning.   Healthcare consumer Olivia [name] will: Actively participate in therapy, working towards healthy functioning.    *Justification for Continuation/Discontinuation of Goal: R=Revised, O=Ongoing, A=Achieved, D=Discontinued  Goal 1) Develop healthy interpersonal relationships that lead to the alleviation and help prevent the relapse of depression.  5 Point Likert rating baseline date: 05/28/2021 Target Date Goal Was reviewed Status Code Progress towards goal/Likert rating  05/29/2023 05/28/2022         O 3/5 - learning to set better limits and boundaries, still hasn't been able to regulate her daily routines  05/25/2024 05/26/2023         O        3.75 - pt reestablished friendship but has not be consistent in reaching out, has improved her relationship with her parents with appropriate limits.  Is more accepting of her relationship with her estranged daughter as healthy for her and not a failure        Goal 2) Learn and implement coping skills that result in a reduction of anxiety and worry, and improved daily functioning.  5 Point Likert rating baseline date: 05/28/2021  Target Date Goal Was reviewed Status Code Progress towards goal/Likert rating  05/29/2023 05/28/2022          O 2/5 - learning to use her skills more consistently  05/25/2024 05/26/2023          O 3.5 - pt is learning to use her skills and better manage her unrealistic and perfectionist tendencies that keep her stressed and anxious        Goal 3) Combine skills learned in therapy into a new daily approach to managing ADHD.  5 Point Likert rating baseline date: 05/28/2021  Target Date Goal Was reviewed Status Code Progress towards goal/Likert rating  05/29/2023 05/28/2022          O 2/5 - learning to use her skills more consistently  05/25/2024 05/26/2023          O 3.75 - accepted the need and began medication, has been more aware, recognize,  acknowledge and accept the issues related ADHD and is better able to apply strategies. Pt is kinder to herself around her "limitations" or need for an alternative way of approaching tasks        This plan has been reviewed and created by the following participants:  This plan will be reviewed at least every 12 months. Date Behavioral Olivia Clinician Date Guardian/Patient   05/28/2022 [name], Ph.D.   05/28/2022 Olivia [name]  05/26/2023 [name], Ph.D.  05/26/2023 Olivia [name]              Diagnosis: Major Depressive Disorder, recurrent, mild Generalized Anxiety Disorder Attention Deficit Disorder, predominantly inattentive    Olivia [name] reports that she asked a friend to go with her to a work event as we d/ in our previous session.  The friend said NO.  Olivia [name] went anyway.  We d/e/p what occurred, how she managed to cope and what was difficult.  She shared that she did a  better job of keeping and using her lists and accomplished more.  Olivia [name] reached out to a couple of friends and made plans in response to our last session.     Home Practice: journal on weekly prompts   [name], PhD  Friend - Amy  Super Ego - Kathie Rhodes

## 2023-06-10 ENCOUNTER — Other Ambulatory Visit (HOSPITAL_COMMUNITY): Payer: Self-pay

## 2023-06-11 ENCOUNTER — Other Ambulatory Visit (HOSPITAL_COMMUNITY): Payer: Self-pay

## 2023-06-11 ENCOUNTER — Other Ambulatory Visit: Payer: Self-pay | Admitting: Pharmacist

## 2023-06-11 ENCOUNTER — Other Ambulatory Visit: Payer: Self-pay

## 2023-06-11 MED ORDER — TALTZ 80 MG/ML ~~LOC~~ SOAJ
SUBCUTANEOUS | 3 refills | Status: DC
Start: 2023-06-11 — End: 2023-06-11
  Filled 2023-06-11: qty 1, 28d supply, fill #0

## 2023-06-11 MED ORDER — TALTZ 80 MG/ML ~~LOC~~ SOAJ
SUBCUTANEOUS | 3 refills | Status: DC
Start: 2023-06-11 — End: 2024-01-19
  Filled 2023-06-11: qty 1, 28d supply, fill #0
  Filled 2023-07-02: qty 1, 28d supply, fill #1
  Filled 2023-07-23: qty 1, 28d supply, fill #2
  Filled 2023-08-29: qty 1, 28d supply, fill #3
  Filled 2023-09-23: qty 1, 28d supply, fill #4
  Filled 2023-10-21: qty 1, 28d supply, fill #5
  Filled 2023-11-18: qty 1, 28d supply, fill #6
  Filled 2023-12-16: qty 1, 28d supply, fill #7

## 2023-06-12 ENCOUNTER — Other Ambulatory Visit (HOSPITAL_COMMUNITY): Payer: Self-pay

## 2023-06-12 ENCOUNTER — Other Ambulatory Visit: Payer: Self-pay

## 2023-06-16 ENCOUNTER — Ambulatory Visit (INDEPENDENT_AMBULATORY_CARE_PROVIDER_SITE_OTHER): Payer: 59 | Admitting: Psychology

## 2023-06-16 ENCOUNTER — Other Ambulatory Visit (HOSPITAL_BASED_OUTPATIENT_CLINIC_OR_DEPARTMENT_OTHER): Payer: Self-pay

## 2023-06-16 DIAGNOSIS — F411 Generalized anxiety disorder: Secondary | ICD-10-CM | POA: Diagnosis not present

## 2023-06-16 DIAGNOSIS — F900 Attention-deficit hyperactivity disorder, predominantly inattentive type: Secondary | ICD-10-CM

## 2023-06-16 DIAGNOSIS — F330 Major depressive disorder, recurrent, mild: Secondary | ICD-10-CM | POA: Diagnosis not present

## 2023-06-16 NOTE — Progress Notes (Unsigned)
PROGRESS NOTE:    Name: Olivia [name] Date: 06/16/2023 MRN: [id number] DOB: Oct 12, 1966 PCP: Olivia Ora, MD  Time spent: 4:03pm - 5:01pm  Annual Review: 05/25/2024  Today I met with  Olivia [name] in remote video (Caregility) face-to-face individual psychotherapy.  Distance Site: [name]: Dr Odette Horns Remote Office Consent: Obtained verbal consent to transmit session remotely.   Patient is aware of the limitations inherent in participating in virtual therapy.   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 She is normally a high functioning and driven individual and it is a struggle to get motivated to do anything. If it is necessary for work she does it and does a good job, but it is not the same. She has gained 70 pounds in less than 9 months and it has impacted her self image. She is also peri menopausal and these hormonal changes are also likely to be contributing to her changing mood states.   A year and a half ago her father had quadrupel by-pass surgery and he was recently diagnosed with cancer. 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  [name] states that she was diagnosed with adult ADHD and responded well to medications.  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)   [name] he is on the ASD, he is a Holiday representative and attends [employer]   Mental Status Exam: Appearance:   Casual     Behavior:  Appropriate  Motor:  Normal  Speech/Language:   NA  Affect:  Appropriate  Mood:  normal  Thought process:  normal  Thought content:    WNL  Sensory/Perceptual disturbances:    WNL  Orientation:  oriented to person, place, time/date, situation, and day of week  Attention:  Good  Concentration:  Good  Memory:  WNL  Fund of knowledge:   Good  Insight:    Good  Judgment:   Good  Impulse  Control:  Good    Individualized Treatment Plan                Strengths: bright, verbal, motivated, self aware, self advocate   Supports: spouse, family, friends, colleagues   Goal/Needs for Treatment:  In order of importance to patient  1) Develop healthy interpersonal relationships that lead to the alleviation and help prevent the relapse of depression.  2) Learn and implement coping skills that result in a reduction of anxiety and worry, and improved functioning.   3) Combine skills learned in therapy into a new daily approach to managing ADHD.   Client Statement of Needs: requests assistance with piecing back together her life as it once was before she got depressed (burnt out).   Treatment Level: Weekly Individual Outpatient Psychotherapy.  Symptoms: autonomic hyperactivity (e.g., palpitations, shortness of breath, dry mouth, trouble swallowing, nausea, diarrhea).  Childhood history of Attention Deficit Disorder (ADD) that was either diagnosed or later concluded due to the symptoms of behavioral problems at school, impulsivity, temper outbursts, and lack of concentration.  Depressed or irritable mood. Diminished interest in or enjoyment of activities. Easily distracted and drawn from task at hand.  Excessive and/or unrealistic worry that is difficult to control occurring more days than not for at least 6 months about a number of events or activities.  Feelings of hopelessness, worthlessness, or inappropriate guilt.  Lack of energy.  Poor concentration and indecisiveness.  Restless and fidgety; unable to be sedentary for more than a short time.   Client Treatment Preferences: Continue with current therapist   Healthcare consumer's goal for treatment:  Psychologist, [name], Ph.D. will support the patient's ability to achieve the goals identified. Cognitive Behavioral Therapy, Dialectical Behavioral Therapy, Motivational Interviewing, SPACE, parent training, and other  evidenced-based practices will be used to promote progress towards healthy functioning.   Healthcare consumer Olivia [name] will: Actively participate in therapy, working towards healthy functioning.    *Justification for Continuation/Discontinuation of Goal: R=Revised, O=Ongoing, A=Achieved, D=Discontinued  Goal 1) Develop healthy interpersonal relationships that lead to the alleviation and help prevent the relapse of depression.  5 Point Likert rating baseline date: 05/28/2021 Target Date Goal Was reviewed Status Code Progress towards goal/Likert rating  05/29/2023 05/28/2022         O 3/5 - learning to set better limits and boundaries, still hasn't been able to regulate her daily routines  05/25/2024 05/26/2023         O        3.75 - pt reestablished friendship but has not be consistent in reaching out, has improved her relationship with her parents with appropriate limits.  Is more accepting of her relationship with her estranged daughter as healthy for her and not a failure        Goal 2) Learn and implement coping skills that result in a reduction of anxiety and worry, and improved daily functioning.  5 Point Likert rating baseline date: 05/28/2021  Target Date Goal Was reviewed Status Code Progress towards goal/Likert rating  05/29/2023 05/28/2022          O 2/5 - learning to use her skills more consistently  05/25/2024 05/26/2023          O 3.5 - pt is learning to use her skills and better manage her unrealistic and perfectionist tendencies that keep her stressed and anxious        Goal 3) Combine skills learned in therapy into a new daily approach to managing ADHD.  5 Point Likert rating baseline date: 05/28/2021  Target Date Goal Was reviewed Status Code Progress towards goal/Likert rating  05/29/2023 05/28/2022          O 2/5 - learning to use her skills more consistently  05/25/2024 05/26/2023          O 3.75 - accepted the need and began medication, has been more aware, recognize,  acknowledge and accept the issues related ADHD and is better able to apply strategies. Pt is kinder to herself around her "limitations" or need for an alternative way of approaching tasks        This plan has been reviewed and created by the following participants:  This plan will be reviewed at least every 12 months. Date Behavioral Olivia Clinician Date Guardian/Patient   05/28/2022 [name], Ph.D.   05/28/2022 Olivia [name]  05/26/2023 [name], Ph.D.  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  She states that she spoke to her staff and queried them on what they might like to provide in continuing education or sponsor as a team.  We d/e/p what she has been  considering and how this will meet her needs and goals.  Olivia [name] states that her husband asked her to handle the purchase of his car.  The process of buying this car opened up a d/e/p of some negative family of origin dynamics which got stimulated.    Home Practice: journal on weekly prompts   [name], PhD  Friend - Amy  Super Ego - Kathie Rhodes

## 2023-06-18 ENCOUNTER — Other Ambulatory Visit (HOSPITAL_BASED_OUTPATIENT_CLINIC_OR_DEPARTMENT_OTHER): Payer: Self-pay

## 2023-06-23 ENCOUNTER — Ambulatory Visit: Payer: 59 | Admitting: Psychology

## 2023-06-23 DIAGNOSIS — F411 Generalized anxiety disorder: Secondary | ICD-10-CM

## 2023-06-23 DIAGNOSIS — F320 Major depressive disorder, single episode, mild: Secondary | ICD-10-CM

## 2023-06-23 DIAGNOSIS — F900 Attention-deficit hyperactivity disorder, predominantly inattentive type: Secondary | ICD-10-CM | POA: Diagnosis not present

## 2023-06-23 DIAGNOSIS — F330 Major depressive disorder, recurrent, mild: Secondary | ICD-10-CM | POA: Diagnosis not present

## 2023-06-23 NOTE — Progress Notes (Signed)
PROGRESS NOTE:    Name: Olivia Olivia [name] Date: 06/23/2023 MRN: [id number] DOB: [birth year] PCP: Olivia Ora, MD  Time spent: 4:03pm - 5:00pm  Annual Review: 05/25/2024  Today I met with  Olivia Olivia [name] in remote video (Caregility) face-to-face individual psychotherapy.  Distance Site: [name]: Dr Olivia Olivia [name] Remote Office Consent: Obtained verbal consent to transmit session remotely.   Patient is aware of the limitations inherent in participating in virtual therapy.   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 She is normally a high functioning and driven individual and it is a struggle to get motivated to do anything. If it is necessary for work she does it and does a good job, but it is not the same. She has gained 70 pounds in less than 9 months and it has impacted her self image. She is also peri menopausal and these hormonal changes are also likely to be contributing to her changing mood Olivia [name].   A year and a half ago her father had quadrupel by-pass surgery and he was recently diagnosed with cancer. 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  Olivia Olivia [name] that she was diagnosed with adult ADHD and responded well to medications.  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)   Son: Olivia Olivia [name] (15) he is on the ASD, he is a Holiday representative and attends [employer]   Mental Status Exam: Appearance:   Casual     Behavior:  Appropriate  Motor:  Normal  Speech/Language:   NA  Affect:  Appropriate  Mood:  normal  Thought process:  normal  Thought content:    WNL  Sensory/Perceptual disturbances:    WNL  Orientation:  oriented to person, place, time/date, situation, and day of week  Attention:  Good  Concentration:  Good  Memory:  WNL  Fund of knowledge:   Good  Insight:    Good  Judgment:   Good  Impulse  Control:  Good    Individualized Treatment Plan                Strengths: bright, verbal, motivated, self aware, self advocate   Supports: spouse, family, friends, colleagues   Goal/Needs for Treatment:  In order of importance to patient  1) Develop healthy interpersonal relationships that lead to the alleviation and help prevent the relapse of depression.  2) Learn and implement coping skills that result in a reduction of anxiety and worry, and improved functioning.   3) Combine skills learned in therapy into a new daily approach to managing ADHD.   Client Statement of Needs: requests assistance with piecing back together her life as it once was before she got depressed (burnt out).   Treatment Level: Weekly Individual Outpatient Psychotherapy.  Symptoms: autonomic hyperactivity (e.g., palpitations, shortness of breath, dry mouth, trouble swallowing, nausea, diarrhea).  Childhood history of Attention Deficit Disorder (ADD) that was either diagnosed or later concluded due to the symptoms of behavioral problems at school, impulsivity, temper outbursts, and lack of concentration.  Depressed or irritable mood. Diminished interest in or enjoyment of activities. Easily distracted and drawn from task at hand.  Excessive and/or unrealistic worry that is difficult to control occurring more days than not for at least 6 months about a number of events or activities.  Feelings of hopelessness, worthlessness, or inappropriate guilt.  Lack of energy.  Poor concentration and indecisiveness.  Restless and fidgety; unable to be sedentary for more than a short time.   Client Treatment Preferences: Continue with current therapist   Healthcare consumer's goal for treatment:  Psychologist, Olivia Olivia [name], Ph.D. will support the patient's ability to achieve the goals identified. Cognitive Behavioral Therapy, Dialectical Behavioral Therapy, Motivational Interviewing, SPACE, parent training, and other  evidenced-based practices will be used to promote progress towards healthy functioning.   Healthcare consumer Olivia Olivia [name] will: Actively participate in therapy, working towards healthy functioning.    *Justification for Continuation/Discontinuation of Goal: R=Revised, O=Ongoing, A=Achieved, D=Discontinued  Goal 1) Develop healthy interpersonal relationships that lead to the alleviation and help prevent the relapse of depression.  5 Point Likert rating baseline date: 05/28/2021 Target Date Goal Was reviewed Status Code Progress towards goal/Likert rating  05/29/2023 05/28/2022         O 3/5 - learning to set better limits and boundaries, still hasn't been able to regulate her daily routines  05/25/2024 05/26/2023         O        3.75 - pt reestablished friendship but has not be consistent in reaching out, has improved her relationship with her parents with appropriate limits.  Is more accepting of her relationship with her estranged daughter as healthy for her and not a failure        Goal 2) Learn and implement coping skills that result in a reduction of anxiety and worry, and improved daily functioning.  5 Point Likert rating baseline date: 05/28/2021  Target Date Goal Was reviewed Status Code Progress towards goal/Likert rating  05/29/2023 05/28/2022          O 2/5 - learning to use her skills more consistently  05/25/2024 05/26/2023          O 3.5 - pt is learning to use her skills and better manage her unrealistic and perfectionist tendencies that keep her stressed and anxious        Goal 3) Combine skills learned in therapy into a new daily approach to managing ADHD.  5 Point Likert rating baseline date: 05/28/2021  Target Date Goal Was reviewed Status Code Progress towards goal/Likert rating  05/29/2023 05/28/2022          O 2/5 - learning to use her skills more consistently  05/25/2024 05/26/2023          O 3.75 - accepted the need and began medication, has been more aware, recognize,  acknowledge and accept the issues related ADHD and is better able to apply strategies. Pt is kinder to herself around her "limitations" or need for an alternative way of approaching tasks        This plan has been reviewed and created by the following participants:  This plan will be reviewed at least every 12 months. Date Behavioral Olivia Clinician Date Guardian/Patient   05/28/2022 Olivia Olivia [name], Ph.D.   05/28/2022 Orvil Feil" Funke  05/26/2023 Olivia Olivia [name], Ph.D.  05/26/2023 Santina Evans "[name]" Andrey Farmer              Diagnosis: Major Depressive Disorder, recurrent, mild Generalized Anxiety Disorder Attention Deficit Disorder, predominantly inattentive   [name] reports that she was able to come to an agreement with her husband, and was able to pick up his new car.  She was able to continue to have conversations about financial topics with her husband.  She shared an insight she had related to people pleasing and anxiety this week  which she was eager to tell me about it.     Lastly, we d/ that she was having to push through things because her motivation was low and feeling draggy.   She was wondering if it was time to increase her antidepressant since she start cautiously at a low dose.  We d/ the benefits on increasing the Wellbutrin vs. Lexapro given her symptoms and ADHD.  [name] agreed to consult her provider. Home Practice: journal on weekly prompts   Olivia Favors, PhD  Friend - Amy  Super Ego - Kathie Rhodes

## 2023-06-30 ENCOUNTER — Ambulatory Visit: Payer: 59 | Admitting: Psychology

## 2023-06-30 DIAGNOSIS — F411 Generalized anxiety disorder: Secondary | ICD-10-CM | POA: Diagnosis not present

## 2023-06-30 DIAGNOSIS — F320 Major depressive disorder, single episode, mild: Secondary | ICD-10-CM

## 2023-06-30 DIAGNOSIS — F900 Attention-deficit hyperactivity disorder, predominantly inattentive type: Secondary | ICD-10-CM

## 2023-06-30 DIAGNOSIS — F330 Major depressive disorder, recurrent, mild: Secondary | ICD-10-CM

## 2023-06-30 NOTE — Progress Notes (Signed)
PROGRESS NOTE:    Name: Olivia [name] Date: 06/30/2023 MRN: [id number] DOB: 1966-04-01 PCP: Willow Ora, MD  Time spent: 4:02 PM - 4:59 PM  Annual Review: 05/25/2024  Today I met with  Olivia [name] in remote video (Caregility) face-to-face individual psychotherapy.  Distance Site: [name]: Dr Odette Horns Remote Office Consent: Obtained verbal consent to transmit session remotely.   Patient is aware of the limitations inherent in participating in virtual therapy.   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 She is normally a high functioning and driven individual and it is a struggle to get motivated to do anything. If it is necessary for work she does it and does a good job, but it is not the same. She has gained 70 pounds in less than 9 months and it has impacted her self image. She is also peri menopausal and these hormonal changes are also likely to be contributing to her changing mood states.   A year and a half ago her father had quadrupel by-pass surgery and he was recently diagnosed with cancer. 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  Olivia [name] states that she was diagnosed with adult ADHD and responded well to medications.  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)   [name] he is on the ASD, he is a Holiday representative and attends [employer]   Mental Status Exam: Appearance:   Casual     Behavior:  Appropriate  Motor:  Normal  Speech/Language:   NA  Affect:  Appropriate  Mood:  normal  Thought process:  normal  Thought content:    WNL  Sensory/Perceptual disturbances:    WNL  Orientation:  oriented to person, place, time/date, situation, and day of week  Attention:  Good  Concentration:  Good  Memory:  WNL  Fund of knowledge:   Good  Insight:    Good  Judgment:   Good  Impulse  Control:  Good    Individualized Treatment Plan                Strengths: bright, verbal, motivated, self aware, self advocate   Supports: spouse, family, friends, colleagues   Goal/Needs for Treatment:  In order of importance to patient  1) Develop healthy interpersonal relationships that lead to the alleviation and help prevent the relapse of depression.  2) Learn and implement coping skills that result in a reduction of anxiety and worry, and improved functioning.   3) Combine skills learned in therapy into a new daily approach to managing ADHD.   Client Statement of Needs: requests assistance with piecing back together her life as it once was before she got depressed (burnt out).   Treatment Level: Weekly Individual Outpatient Psychotherapy.  Symptoms: autonomic hyperactivity (e.g., palpitations, shortness of breath, dry mouth, trouble swallowing, nausea, diarrhea).  Childhood history of Attention Deficit Disorder (ADD) that was either diagnosed or later concluded due to the symptoms of behavioral problems at school, impulsivity, temper outbursts, and lack of concentration.  Depressed or irritable mood. Diminished interest in or enjoyment of activities. Easily distracted and drawn from task at hand.  Excessive and/or unrealistic worry that is difficult to control occurring more days than not for at least 6 months about a number of events or activities.  Feelings of hopelessness, worthlessness, or inappropriate guilt.  Lack of  energy.   Poor concentration and indecisiveness.  Restless and fidgety; unable to be sedentary for more than a short time.   Client Treatment Preferences: Continue with current therapist   Healthcare consumer's goal for treatment:  Psychologist, Olivia [name], Ph.D. will support the patient's ability to achieve the goals identified. Cognitive Behavioral Therapy, Dialectical Behavioral Therapy, Motivational Interviewing, SPACE, parent training, and other  evidenced-based practices will be used to promote progress towards healthy functioning.   Healthcare consumer Olivia [name] will: Actively participate in therapy, working towards healthy functioning.    *Justification for Continuation/Discontinuation of Goal: R=Revised, O=Ongoing, A=Achieved, D=Discontinued  Goal 1) Develop healthy interpersonal relationships that lead to the alleviation and help prevent the relapse of depression.  5 Point Likert rating baseline date: 05/28/2021 Target Date Goal Was reviewed Status Code Progress towards goal/Likert rating  05/29/2023 05/28/2022         O 3/5 - learning to set better limits and boundaries, still hasn't been able to regulate her daily routines  05/25/2024 05/26/2023         O        3.75 - pt reestablished friendship but has not be consistent in reaching out, has improved her relationship with her parents with appropriate limits.  Is more accepting of her relationship with her estranged daughter as healthy for her and not a failure        Goal 2) Learn and implement coping skills that result in a reduction of anxiety and worry, and improved daily functioning.  5 Point Likert rating baseline date: 05/28/2021  Target Date Goal Was reviewed Status Code Progress towards goal/Likert rating  05/29/2023 05/28/2022          O 2/5 - learning to use her skills more consistently  05/25/2024 05/26/2023          O 3.5 - pt is learning to use her skills and better manage her unrealistic and perfectionist tendencies that keep her stressed and anxious        Goal 3) Combine skills learned in therapy into a new daily approach to managing ADHD.  5 Point Likert rating baseline date: 05/28/2021  Target Date Goal Was reviewed Status Code Progress towards goal/Likert rating  05/29/2023 05/28/2022          O 2/5 - learning to use her skills more consistently  05/25/2024 05/26/2023          O 3.75 - accepted the need and began medication, has been more aware, recognize,  acknowledge and accept the issues related ADHD and is better able to apply strategies. Pt is kinder to herself around her "limitations" or need for an alternative way of approaching tasks        This plan has been reviewed and created by the following participants:  This plan will be reviewed at least every 12 months. Date Behavioral Olivia Clinician Date Guardian/Patient   05/28/2022 Olivia [name], Ph.D.   05/28/2022 Olivia [name]  05/26/2023 Olivia [name], Ph.D.  05/26/2023 Olivia [name]              Diagnosis: Major Depressive Disorder, recurrent, mild Generalized Anxiety Disorder Attention Deficit Disorder, predominantly inattentive   Olivia [name] reports that she managed to get her sleep schedule turned around.  Regarding her Olivia, she is still struggling some days to eat enough due to the nausea and throwing up.      Olivia [name] states that she attempted to have a talk with her husband about  his anxiety.  We d/e/p what occurred, not knowing how to help and provide support.  Olivia Favors, PhD  Friend - Amy  Super Ego - Kathie Rhodes

## 2023-07-02 ENCOUNTER — Other Ambulatory Visit (HOSPITAL_COMMUNITY): Payer: Self-pay

## 2023-07-02 DIAGNOSIS — E890 Postprocedural hypothyroidism: Secondary | ICD-10-CM | POA: Diagnosis not present

## 2023-07-02 DIAGNOSIS — R7982 Elevated C-reactive protein (CRP): Secondary | ICD-10-CM | POA: Diagnosis not present

## 2023-07-02 DIAGNOSIS — E559 Vitamin D deficiency, unspecified: Secondary | ICD-10-CM | POA: Diagnosis not present

## 2023-07-02 DIAGNOSIS — E063 Autoimmune thyroiditis: Secondary | ICD-10-CM | POA: Diagnosis not present

## 2023-07-02 DIAGNOSIS — R040 Epistaxis: Secondary | ICD-10-CM | POA: Diagnosis not present

## 2023-07-02 DIAGNOSIS — R590 Localized enlarged lymph nodes: Secondary | ICD-10-CM | POA: Diagnosis not present

## 2023-07-02 DIAGNOSIS — J340 Abscess, furuncle and carbuncle of nose: Secondary | ICD-10-CM | POA: Diagnosis not present

## 2023-07-02 DIAGNOSIS — E039 Hypothyroidism, unspecified: Secondary | ICD-10-CM | POA: Diagnosis not present

## 2023-07-02 DIAGNOSIS — R799 Abnormal finding of blood chemistry, unspecified: Secondary | ICD-10-CM | POA: Diagnosis not present

## 2023-07-02 DIAGNOSIS — R947 Abnormal results of other endocrine function studies: Secondary | ICD-10-CM | POA: Diagnosis not present

## 2023-07-02 DIAGNOSIS — D6489 Other specified anemias: Secondary | ICD-10-CM | POA: Diagnosis not present

## 2023-07-02 DIAGNOSIS — R946 Abnormal results of thyroid function studies: Secondary | ICD-10-CM | POA: Diagnosis not present

## 2023-07-02 DIAGNOSIS — R5383 Other fatigue: Secondary | ICD-10-CM | POA: Diagnosis not present

## 2023-07-14 ENCOUNTER — Ambulatory Visit (INDEPENDENT_AMBULATORY_CARE_PROVIDER_SITE_OTHER): Payer: 59 | Admitting: Psychology

## 2023-07-14 ENCOUNTER — Other Ambulatory Visit (HOSPITAL_BASED_OUTPATIENT_CLINIC_OR_DEPARTMENT_OTHER): Payer: Self-pay

## 2023-07-14 DIAGNOSIS — F900 Attention-deficit hyperactivity disorder, predominantly inattentive type: Secondary | ICD-10-CM | POA: Diagnosis not present

## 2023-07-14 DIAGNOSIS — F411 Generalized anxiety disorder: Secondary | ICD-10-CM

## 2023-07-14 DIAGNOSIS — F320 Major depressive disorder, single episode, mild: Secondary | ICD-10-CM

## 2023-07-14 DIAGNOSIS — F330 Major depressive disorder, recurrent, mild: Secondary | ICD-10-CM | POA: Diagnosis not present

## 2023-07-14 NOTE — Progress Notes (Signed)
PROGRESS NOTE:    Name: Olivia [name] Date: 07/14/2023 MRN: [id number] DOB: [birth year] PCP: Willow Ora, MD  Time spent: 4:01 PM - 4:59 PM  Annual Review: 05/25/2024  Today I met with  [name] in remote video (Caregility) face-to-face individual psychotherapy.  Distance Site: [name]: Dr Odette Horns Remote Office Consent: Obtained verbal consent to transmit session remotely.   Patient is aware of the limitations inherent in participating in virtual therapy.   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 She is normally a high functioning and driven individual and it is a struggle to get motivated to do anything. If it is necessary for work she does it and does a good job, but it is not the same. She has gained 70 pounds in less than 9 months and it has impacted her self image. She is also peri menopausal and these hormonal changes are also likely to be contributing to her changing mood states.   A year and a half ago her father had quadrupel by-pass surgery and he was recently diagnosed with cancer. 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  Koriann states that she was diagnosed with adult ADHD and responded well to medications.  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)   Son: Health visitor (15) he is on the ASD, he is a Holiday representative and attends [employer]   Mental Status Exam: Appearance:   Casual     Behavior:  Appropriate  Motor:  Normal  Speech/Language:   NA  Affect:  Appropriate  Mood:  normal  Thought process:  normal  Thought content:    WNL  Sensory/Perceptual disturbances:    WNL  Orientation:  oriented to person, place, time/date, situation, and day of week  Attention:  Good  Concentration:  Good  Memory:  WNL  Fund of knowledge:   Good  Insight:    Good  Judgment:   Good  Impulse  Control:  Good    Individualized Treatment Plan                Strengths: bright, verbal, motivated, self aware, self advocate   Supports: spouse, family, friends, colleagues   Goal/Needs for Treatment:  In order of importance to patient  1) Develop healthy interpersonal relationships that lead to the alleviation and help prevent the relapse of depression.  2) Learn and implement coping skills that result in a reduction of anxiety and worry, and improved functioning.   3) Combine skills learned in therapy into a new daily approach to managing ADHD.   Client Statement of Needs: requests assistance with piecing back together her life as it once was before she got depressed (burnt out).   Treatment Level: Weekly Individual Outpatient Psychotherapy.  Symptoms: autonomic hyperactivity (e.g., palpitations, shortness of breath, dry mouth, trouble swallowing, nausea, diarrhea).  Childhood history of Attention Deficit Disorder (ADD) that was either diagnosed or later concluded due to the symptoms of behavioral problems at school, impulsivity, temper outbursts, and lack of concentration.  Depressed or irritable mood. Diminished interest in or enjoyment of activities. Easily distracted and drawn from task at hand.  Excessive and/or unrealistic worry that is difficult to control occurring more days than not for at least 6 months about a number of events or activities.  Feelings of hopelessness, worthlessness, or inappropriate guilt.  Lack of  energy.   Poor concentration and indecisiveness.  Restless and fidgety; unable to be sedentary for more than a short time.   Client Treatment Preferences: Continue with current therapist   Healthcare consumer's goal for treatment:  Psychologist, [name], Ph.D. will support the patient's ability to achieve the goals identified. Cognitive Behavioral Therapy, Dialectical Behavioral Therapy, Motivational Interviewing, SPACE, parent training, and other  evidenced-based practices will be used to promote progress towards healthy functioning.   Healthcare consumer Ebelia "[name]" Agramonte will: Actively participate in therapy, working towards healthy functioning.    *Justification for Continuation/Discontinuation of Goal: R=Revised, O=Ongoing, A=Achieved, D=Discontinued  Goal 1) Develop healthy interpersonal relationships that lead to the alleviation and help prevent the relapse of depression.  5 Point Likert rating baseline date: 05/28/2021 Target Date Goal Was reviewed Status Code Progress towards goal/Likert rating  05/29/2023 05/28/2022         O 3/5 - learning to set better limits and boundaries, still hasn't been able to regulate her daily routines  05/25/2024 05/26/2023         O        3.75 - pt reestablished friendship but has not be consistent in reaching out, has improved her relationship with her parents with appropriate limits.  Is more accepting of her relationship with her estranged daughter as healthy for her and not a failure        Goal 2) Learn and implement coping skills that result in a reduction of anxiety and worry, and improved daily functioning.  5 Point Likert rating baseline date: 05/28/2021  Target Date Goal Was reviewed Status Code Progress towards goal/Likert rating  05/29/2023 05/28/2022          O 2/5 - learning to use her skills more consistently  05/25/2024 05/26/2023          O 3.5 - pt is learning to use her skills and better manage her unrealistic and perfectionist tendencies that keep her stressed and anxious        Goal 3) Combine skills learned in therapy into a new daily approach to managing ADHD.  5 Point Likert rating baseline date: 05/28/2021  Target Date Goal Was reviewed Status Code Progress towards goal/Likert rating  05/29/2023 05/28/2022          O 2/5 - learning to use her skills more consistently  05/25/2024 05/26/2023          O 3.75 - accepted the need and began medication, has been more aware, recognize,  acknowledge and accept the issues related ADHD and is better able to apply strategies. Pt is kinder to herself around her "limitations" or need for an alternative way of approaching tasks        This plan has been reviewed and created by the following participants:  This plan will be reviewed at least every 12 months. Date Behavioral Health Clinician Date Guardian/Patient   05/28/2022 [name], Ph.D.   05/28/2022 [name]  05/26/2023 [name], Ph.D.  05/26/2023 Santina Evans "[name]" Andrey Farmer              Diagnosis: Major Depressive Disorder, recurrent, mild Generalized Anxiety Disorder Attention Deficit Disorder, predominantly inattentive   [name] reports that her sleep was good.  Regarding her health, she states that her calcium levels have stabilized.  She shared some goals she set for herself, how she approached these tasks and feeling accomplished.  Lastly, [name] talked about ongoing issues involving her son we've been d/ and  ways she is learning to approach him.   [name], PhD  Friend - Amy  Super Ego - Kathie Rhodes

## 2023-07-21 ENCOUNTER — Ambulatory Visit (INDEPENDENT_AMBULATORY_CARE_PROVIDER_SITE_OTHER): Payer: 59 | Admitting: Psychology

## 2023-07-21 DIAGNOSIS — F411 Generalized anxiety disorder: Secondary | ICD-10-CM | POA: Diagnosis not present

## 2023-07-21 DIAGNOSIS — F900 Attention-deficit hyperactivity disorder, predominantly inattentive type: Secondary | ICD-10-CM

## 2023-07-21 DIAGNOSIS — F320 Major depressive disorder, single episode, mild: Secondary | ICD-10-CM

## 2023-07-21 NOTE — Progress Notes (Signed)
PROGRESS NOTE:    Name: Olivia [name] Date: 07/21/2023 MRN: [id number] DOB: 1966-03-26 PCP: Olivia Ora, MD  Time spent: 4:00 PM - 4:58 PM  Annual Review: 05/25/2024  Today I met with  Olivia [name] in remote video (Caregility) face-to-face individual psychotherapy.  Distance Site: [name]: Dr Olivia [name] Remote Office Consent: Obtained verbal consent to transmit session remotely.   Patient is aware of the limitations inherent in participating in virtual therapy.   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 She is normally a high functioning and driven individual and it is a struggle to get motivated to do anything. If it is necessary for work she does it and does a good job, but it is not the same. She has gained 70 pounds in less than 9 months and it has impacted her self image. She is also peri menopausal and these hormonal changes are also likely to be contributing to her changing mood states.   A year and a half ago her father had quadrupel by-pass surgery and he was recently diagnosed with cancer. 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  Olivia [name] and responded well to medications.  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)   [name] he is on the ASD, he is a Holiday representative and attends [employer]   Mental Status Exam: Appearance:   Casual     Behavior:  Appropriate  Motor:  Normal  Speech/Language:   NA  Affect:  Appropriate  Mood:  normal  Thought process:  normal  Thought content:    WNL  Sensory/Perceptual disturbances:    WNL  Orientation:  oriented to person, place, time/date, situation, and day of week  Attention:  Good  Concentration:  Good  Memory:  WNL  Fund of knowledge:   Good  Insight:    Good  Judgment:   Good   Impulse Control:  Good    Individualized Treatment Plan                Strengths: bright, verbal, motivated, self aware, self advocate   Supports: spouse, family, friends, colleagues   Goal/Needs for Treatment:  In order of importance to patient  1) Develop healthy interpersonal relationships that lead to the alleviation and help prevent the relapse of depression.  2) Learn and implement coping skills that result in a reduction of anxiety and worry, and improved functioning.   3) Combine skills learned in therapy into a new daily approach to managing [name].   Client Statement of Needs: requests assistance with piecing back together her life as it once was before she got depressed (burnt out).   Treatment Level: Weekly Individual Outpatient Psychotherapy.  Symptoms: autonomic hyperactivity (e.g., palpitations, shortness of breath, dry mouth, trouble swallowing, nausea, diarrhea).  Childhood history of Attention Deficit Disorder (ADD) that was either diagnosed or later concluded due to the symptoms of behavioral problems at school, impulsivity, temper outbursts, and lack of concentration.  Depressed or irritable mood. Diminished interest in or enjoyment of activities. Easily distracted and drawn from task at hand.  Excessive and/or unrealistic worry that is difficult to control occurring more days than not for at least 6 months about a number of events or activities.  Feelings of hopelessness, worthlessness, or inappropriate guilt.  Lack  of energy.   Poor concentration and indecisiveness.  Restless and fidgety; unable to be sedentary for more than a short time.   Client Treatment Preferences: Continue with current therapist   Healthcare consumer's goal for treatment:  Psychologist, Olivia [name], Ph.D. will support the patient's ability to achieve the goals identified. Cognitive Behavioral Therapy, Dialectical Behavioral Therapy, Motivational Interviewing, SPACE, parent training,  and other evidenced-based practices will be used to promote progress towards healthy functioning.   Healthcare consumer Olivia "Olivia Ang" [name] will: Actively participate in therapy, working towards healthy functioning.    *Justification for Continuation/Discontinuation of Goal: R=Revised, O=Ongoing, A=Achieved, D=Discontinued  Goal 1) Develop healthy interpersonal relationships that lead to the alleviation and help prevent the relapse of depression.  5 Point Likert rating baseline date: 05/28/2021 Target Date Goal Was reviewed Status Code Progress towards goal/Likert rating  05/29/2023 05/28/2022         O 3/5 - learning to set better limits and boundaries, still hasn't been able to regulate her daily routines  05/25/2024 05/26/2023         O        3.75 - pt reestablished friendship but has not be consistent in reaching out, has improved her relationship with her parents with appropriate limits.  Is more accepting of her relationship with her estranged daughter as healthy for her and not a failure        Goal 2) Learn and implement coping skills that result in a reduction of anxiety and worry, and improved daily functioning.  5 Point Likert rating baseline date: 05/28/2021  Target Date Goal Was reviewed Status Code Progress towards goal/Likert rating  05/29/2023 05/28/2022          O 2/5 - learning to use her skills more consistently  05/25/2024 05/26/2023          O 3.5 - pt is learning to use her skills and better manage her unrealistic and perfectionist tendencies that keep her stressed and anxious        Goal 3) Combine skills learned in therapy into a new daily approach to managing [name].  5 Point Likert rating baseline date: 05/28/2021  Target Date Goal Was reviewed Status Code Progress towards goal/Likert rating  05/29/2023 05/28/2022          O 2/5 - learning to use her skills more consistently  05/25/2024 05/26/2023          O 3.75 - accepted the need and began medication, has been more aware,  recognize, acknowledge and accept the issues related [name] and is better able to apply strategies. Pt is kinder to herself around her "limitations" or need for an alternative way of approaching tasks        This plan has been reviewed and created by the following participants:  This plan will be reviewed at least every 12 months. Date Behavioral Olivia Clinician Date Guardian/Patient   05/28/2022 Olivia [name], Ph.D.   05/28/2022 [name]  05/26/2023 Olivia [name], Ph.D.  05/26/2023 Olivia Evans "Olivia Ang" Andrey Farmer              Diagnosis: Major Depressive Disorder, recurrent, mild Generalized Anxiety Disorder Attention Deficit Disorder, predominantly inattentive   Olivia Ang reports that her son is sick with COVID.  She has been good to herself and allowed herself to rest this weekend.  We d/p that she didn't get down on herself,    Olivia Favors, PhD  Friend - Amy  Super Ego - Kathie Rhodes

## 2023-07-23 ENCOUNTER — Other Ambulatory Visit: Payer: Self-pay

## 2023-07-24 ENCOUNTER — Ambulatory Visit (INDEPENDENT_AMBULATORY_CARE_PROVIDER_SITE_OTHER): Payer: 59 | Admitting: Psychology

## 2023-07-24 DIAGNOSIS — F330 Major depressive disorder, recurrent, mild: Secondary | ICD-10-CM | POA: Diagnosis not present

## 2023-07-24 DIAGNOSIS — F900 Attention-deficit hyperactivity disorder, predominantly inattentive type: Secondary | ICD-10-CM

## 2023-07-24 DIAGNOSIS — F411 Generalized anxiety disorder: Secondary | ICD-10-CM | POA: Diagnosis not present

## 2023-07-24 DIAGNOSIS — F4310 Post-traumatic stress disorder, unspecified: Secondary | ICD-10-CM

## 2023-07-24 NOTE — Progress Notes (Signed)
PROGRESS NOTE:    Name: Olivia [name] Date: 07/24/2023 MRN: [id number] DOB: Sep 27, 1966 PCP: Olivia Ora, MD  Time spent: 4:00 PM - 4:58 PM  Annual Review: 05/25/2024  Today I met with  Olivia [name] in remote video (Caregility) face-to-face individual psychotherapy.  Distance Site: [name]: Dr Olivia [name] Remote Office Consent: Obtained verbal consent to transmit session remotely.   Patient is aware of the limitations inherent in participating in virtual therapy.   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 She is normally a high functioning and driven individual and it is a struggle to get motivated to do anything. If it is necessary for work she does it and does a good job, but it is not the same. She has gained 70 pounds in less than 9 months and it has impacted her self image. She is also peri menopausal and these hormonal changes are also likely to be contributing to her changing mood states.   A year and a half ago her father had quadrupel by-pass surgery and he was recently diagnosed with cancer. 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  Olivia [name] states that she was diagnosed with adult ADHD and responded well to medications.  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)   [name] he is on the ASD, he is a Holiday representative and attends Olivia [name]   Mental Status Exam: Appearance:   Casual     Behavior:  Appropriate  Motor:  Normal  Speech/Language:   NA  Affect:  Appropriate  Mood:  normal  Thought process:  normal  Thought content:    WNL  Sensory/Perceptual disturbances:    WNL  Orientation:  oriented to person, place, time/date, situation, and day of week  Attention:  Good  Concentration:  Good  Memory:  WNL  Fund of knowledge:   Good  Insight:    Good  Judgment:    Good  Impulse Control:  Good    Individualized Treatment Plan                Strengths: bright, verbal, motivated, self aware, self advocate   Supports: spouse, family, friends, colleagues   Goal/Needs for Treatment:  In order of importance to patient  1) Develop healthy interpersonal relationships that lead to the alleviation and help prevent the relapse of depression.  2) Learn and implement coping skills that result in a reduction of anxiety and worry, and improved functioning.   3) Combine skills learned in therapy into a new daily approach to managing ADHD.   Client Statement of Needs: requests assistance with piecing back together her life as it once was before she got depressed (burnt out).   Treatment Level: Weekly Individual Outpatient Psychotherapy.  Symptoms: autonomic hyperactivity (e.g., palpitations, shortness of breath, dry mouth, trouble swallowing, nausea, diarrhea).  Childhood history of Attention Deficit Disorder (ADD) that was either diagnosed or later concluded due to the symptoms of behavioral problems at school, impulsivity, temper outbursts, and lack of concentration.  Depressed or irritable mood. Diminished interest in or enjoyment of activities. Easily distracted and drawn from task at hand.  Excessive and/or unrealistic worry that is difficult to control occurring more days than not for at least 6 months about a number of events or activities.  Feelings of hopelessness, worthlessness,  or inappropriate guilt.  Lack of energy.   Poor concentration and indecisiveness.  Restless and fidgety; unable to be sedentary for more than a short time.   Client Treatment Preferences: Continue with current therapist   Healthcare consumer's goal for treatment:  Psychologist, Olivia [name], Ph.D. will support the patient's ability to achieve the goals identified. Cognitive Behavioral Therapy, Dialectical Behavioral Therapy, Motivational Interviewing, SPACE, parent  training, and other evidenced-based practices will be used to promote progress towards healthy functioning.   Healthcare consumer Olivia [name] will: Actively participate in therapy, working towards healthy functioning.    *Justification for Continuation/Discontinuation of Goal: R=Revised, O=Ongoing, A=Achieved, D=Discontinued  Goal 1) Develop healthy interpersonal relationships that lead to the alleviation and help prevent the relapse of depression.  5 Point Likert rating baseline date: 05/28/2021 Target Date Goal Was reviewed Status Code Progress towards goal/Likert rating  05/29/2023 05/28/2022         O 3/5 - learning to set better limits and boundaries, still hasn't been able to regulate her daily routines  05/25/2024 05/26/2023         O        3.75 - pt reestablished friendship but has not be consistent in reaching out, has improved her relationship with her parents with appropriate limits.  Is more accepting of her relationship with her estranged daughter as healthy for her and not a failure        Goal 2) Learn and implement coping skills that result in a reduction of anxiety and worry, and improved daily functioning.  5 Point Likert rating baseline date: 05/28/2021  Target Date Goal Was reviewed Status Code Progress towards goal/Likert rating  05/29/2023 05/28/2022          O 2/5 - learning to use her skills more consistently  05/25/2024 05/26/2023          O 3.5 - pt is learning to use her skills and better manage her unrealistic and perfectionist tendencies that keep her stressed and anxious        Goal 3) Combine skills learned in therapy into a new daily approach to managing ADHD.  5 Point Likert rating baseline date: 05/28/2021  Target Date Goal Was reviewed Status Code Progress towards goal/Likert rating  05/29/2023 05/28/2022          O 2/5 - learning to use her skills more consistently  05/25/2024 05/26/2023          O 3.75 - accepted the need and began medication, has been  more aware, recognize, acknowledge and accept the issues related ADHD and is better able to apply strategies. Pt is kinder to herself around her "limitations" or need for an alternative way of approaching tasks        This plan has been reviewed and created by the following participants:  This plan will be reviewed at least every 12 months. Date Behavioral Olivia Clinician Date Guardian/Patient   05/28/2022 Olivia [name], Ph.D.   05/28/2022 Olivia [name]  05/26/2023 Olivia [name], Ph.D.  05/26/2023 Olivia [name]              Diagnosis: Major Depressive Disorder, recurrent, mild Generalized Anxiety Disorder Attention Deficit Disorder, predominantly inattentive   [name] reached out for an additional session today.  She shared that she was watching tv with her husband and found herself unexpectedly triggered.  She wasn't able to control the intrusive images and had a panic attack.  We d/p in a general  way what occurred while we intervened with bilateral stimulation (audio).  I taught her some additional calming strategies and I had her create an SOS card where she listed skills and strategies to turn to.    Olivia Favors, PhD  Friend - Amy  Super Ego - Fransico Meadow, PhD

## 2023-07-26 ENCOUNTER — Encounter (HOSPITAL_COMMUNITY): Payer: Self-pay

## 2023-07-28 ENCOUNTER — Ambulatory Visit (INDEPENDENT_AMBULATORY_CARE_PROVIDER_SITE_OTHER): Payer: 59 | Admitting: Psychology

## 2023-07-28 DIAGNOSIS — F4310 Post-traumatic stress disorder, unspecified: Secondary | ICD-10-CM

## 2023-07-28 DIAGNOSIS — F330 Major depressive disorder, recurrent, mild: Secondary | ICD-10-CM | POA: Diagnosis not present

## 2023-07-28 DIAGNOSIS — F411 Generalized anxiety disorder: Secondary | ICD-10-CM

## 2023-07-28 DIAGNOSIS — F900 Attention-deficit hyperactivity disorder, predominantly inattentive type: Secondary | ICD-10-CM

## 2023-07-28 NOTE — Progress Notes (Signed)
PROGRESS NOTE:    Name: Olivia [name] Date: 07/28/2023 MRN: [id number] DOB: 1966-09-28 PCP: Willow Ora, MD  Time spent: 4:01 PM - 4:59 PM  Annual Review: 05/25/2024  Today I met with  Olivia [name] in remote video (Caregility) face-to-face individual psychotherapy.  Distance Site: [name]: Dr Odette Horns Remote Office Consent: Obtained verbal consent to transmit session remotely.   Patient is aware of the limitations inherent in participating in virtual therapy.   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 She is normally a high functioning and driven individual and it is a struggle to get motivated to do anything. If it is necessary for work she does it and does a good job, but it is not the same. She has gained 70 pounds in less than 9 months and it has impacted her self image. She is also peri menopausal and these hormonal changes are also likely to be contributing to her changing mood states.   A year and a half ago her father had quadrupel by-pass surgery and he was recently diagnosed with cancer. 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  Olivia [name] states that she was diagnosed with adult ADHD and responded well to medications.  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)   [name] he is on the ASD, he is a Holiday representative and attends [employer]   Mental Status Exam: Appearance:   Casual     Behavior:  Appropriate  Motor:  Normal  Speech/Language:   NA  Affect:  Appropriate  Mood:  normal  Thought process:  normal  Thought content:    WNL  Sensory/Perceptual disturbances:    WNL  Orientation:  oriented to person, place, time/date, situation, and day of week  Attention:  Good  Concentration:  Good  Memory:  WNL  Fund of knowledge:   Good  Insight:    Good  Judgment:   Good  Impulse  Control:  Good    Individualized Treatment Plan                Strengths: bright, verbal, motivated, self aware, self advocate   Supports: spouse, family, friends, colleagues   Goal/Needs for Treatment:  In order of importance to patient  1) Develop healthy interpersonal relationships that lead to the alleviation and help prevent the relapse of depression.  2) Learn and implement coping skills that result in a reduction of anxiety and worry, and improved functioning.   3) Combine skills learned in therapy into a new daily approach to managing ADHD.   Client Statement of Needs: requests assistance with piecing back together her life as it once was before she got depressed (burnt out).   Treatment Level: Weekly Individual Outpatient Psychotherapy.  Symptoms: autonomic hyperactivity (e.g., palpitations, shortness of breath, dry mouth, trouble swallowing, nausea, diarrhea).  Childhood history of Attention Deficit Disorder (ADD) that was either diagnosed or later concluded due to the symptoms of behavioral problems at school, impulsivity, temper outbursts, and lack of concentration.  Depressed or irritable mood. Diminished interest in or enjoyment of activities. Easily distracted and drawn from task at hand.  Excessive and/or unrealistic worry that is difficult to control occurring more days than not for at least 6 months about a number of events or activities.  Feelings of hopelessness, worthlessness, or inappropriate guilt.  Lack of  energy.   Poor concentration and indecisiveness.  Restless and fidgety; unable to be sedentary for more than a short time.   Client Treatment Preferences: Continue with current therapist   Healthcare consumer's goal for treatment:  Psychologist, Olivia [name], Ph.D. will support the patient's ability to achieve the goals identified. Cognitive Behavioral Therapy, Dialectical Behavioral Therapy, Motivational Interviewing, SPACE, parent training, and other  evidenced-based practices will be used to promote progress towards healthy functioning.   Healthcare consumer Olivia [name] will: Actively participate in therapy, working towards healthy functioning.    *Justification for Continuation/Discontinuation of Goal: R=Revised, O=Ongoing, A=Achieved, D=Discontinued  Goal 1) Develop healthy interpersonal relationships that lead to the alleviation and help prevent the relapse of depression.  5 Point Likert rating baseline date: 05/28/2021 Target Date Goal Was reviewed Status Code Progress towards goal/Likert rating  05/29/2023 05/28/2022         O 3/5 - learning to set better limits and boundaries, still hasn't been able to regulate her daily routines  05/25/2024 05/26/2023         O        3.75 - pt reestablished friendship but has not be consistent in reaching out, has improved her relationship with her parents with appropriate limits.  Is more accepting of her relationship with her estranged daughter as healthy for her and not a failure        Goal 2) Learn and implement coping skills that result in a reduction of anxiety and worry, and improved daily functioning.  5 Point Likert rating baseline date: 05/28/2021  Target Date Goal Was reviewed Status Code Progress towards goal/Likert rating  05/29/2023 05/28/2022          O 2/5 - learning to use her skills more consistently  05/25/2024 05/26/2023          O 3.5 - pt is learning to use her skills and better manage her unrealistic and perfectionist tendencies that keep her stressed and anxious        Goal 3) Combine skills learned in therapy into a new daily approach to managing ADHD.  5 Point Likert rating baseline date: 05/28/2021  Target Date Goal Was reviewed Status Code Progress towards goal/Likert rating  05/29/2023 05/28/2022          O 2/5 - learning to use her skills more consistently  05/25/2024 05/26/2023          O 3.75 - accepted the need and began medication, has been more aware, recognize,  acknowledge and accept the issues related ADHD and is better able to apply strategies. Pt is kinder to herself around her "limitations" or need for an alternative way of approaching tasks        This plan has been reviewed and created by the following participants:  This plan will be reviewed at least every 12 months. Date Behavioral Olivia Clinician Date Guardian/Patient   05/28/2022 Olivia [name], Ph.D.   05/28/2022 Orvil Feil" Andrey [name]  05/26/2023 Olivia [name], Ph.D.  05/26/2023 Olivia [name]              Diagnosis: Major Depressive Disorder, recurrent, mild Generalized Anxiety Disorder Attention Deficit Disorder, predominantly inattentive   Olivia [name] reports that she was doing better since we last worked.  She used the exercises we d/ in our last session and she found them to be very helpful in managing the intrusive thoughts as well as lessening her anxiety.    Olivia [name] states that her son was  irritable and appeared agitated, she was able to have a talk with her son about his behavior, they were able to identify what was making so angry and were able to set some limits on following tragic news reports.  I noted that he would also benefit from some of the exercises we d/ to help him.  Lastly, we d/e/p questions she was having related to changing her will.  I advised her to speak with her attorney about how this situation is typically handled.   Olivia Favors, PhD  Friend - Amy  Super Ego - Kathie Rhodes

## 2023-08-04 ENCOUNTER — Other Ambulatory Visit (HOSPITAL_BASED_OUTPATIENT_CLINIC_OR_DEPARTMENT_OTHER): Payer: Self-pay

## 2023-08-04 ENCOUNTER — Ambulatory Visit (INDEPENDENT_AMBULATORY_CARE_PROVIDER_SITE_OTHER): Payer: 59 | Admitting: Psychology

## 2023-08-04 ENCOUNTER — Other Ambulatory Visit: Payer: Self-pay

## 2023-08-04 DIAGNOSIS — F330 Major depressive disorder, recurrent, mild: Secondary | ICD-10-CM

## 2023-08-04 DIAGNOSIS — F411 Generalized anxiety disorder: Secondary | ICD-10-CM | POA: Diagnosis not present

## 2023-08-04 DIAGNOSIS — F900 Attention-deficit hyperactivity disorder, predominantly inattentive type: Secondary | ICD-10-CM | POA: Diagnosis not present

## 2023-08-04 DIAGNOSIS — F4310 Post-traumatic stress disorder, unspecified: Secondary | ICD-10-CM

## 2023-08-04 MED ORDER — PROGESTERONE MICRONIZED 100 MG PO CAPS
100.0000 mg | ORAL_CAPSULE | Freq: Every evening | ORAL | 2 refills | Status: DC
  Filled 2023-08-04 – 2023-08-27 (×2): qty 60, 60d supply, fill #0
  Filled 2024-02-15: qty 60, 60d supply, fill #1

## 2023-08-04 MED ORDER — LEVOTHYROXINE SODIUM 50 MCG PO TABS
50.0000 ug | ORAL_TABLET | Freq: Every day | ORAL | 1 refills | Status: DC
Start: 2023-08-04 — End: 2023-10-08
  Filled 2023-08-04 – 2023-08-27 (×2): qty 90, 90d supply, fill #0

## 2023-08-04 NOTE — Progress Notes (Signed)
PROGRESS NOTE:    Name: Olivia [name] Date: 08/04/2023 MRN: [id number] DOB: 1966-03-23 PCP: Willow Ora, MD  Time spent: 4:00 PM - 4:58 PM  Annual Review: 05/25/2024  Today I met with  Olivia [name] in remote video (Caregility) face-to-face individual psychotherapy.  Distance Site: [name]: Dr Odette Horns Remote Office Consent: Obtained verbal consent to transmit session remotely.   Patient is aware of the limitations inherent in participating in virtual therapy.   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 She is normally a high functioning and driven individual and it is a struggle to get motivated to do anything. If it is necessary for work she does it and does a good job, but it is not the same. She has gained 70 pounds in less than 9 months and it has impacted her self image. She is also peri menopausal and these hormonal changes are also likely to be contributing to her changing mood states.   A year and a half ago her father had quadrupel by-pass surgery and he was recently diagnosed with cancer. 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  Olivia [name] states that she was diagnosed with adult ADHD and responded well to medications.  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)   [name] he is on the ASD, he is a Holiday representative and attends [employer]   Mental Status Exam: Appearance:   Casual     Behavior:  Appropriate  Motor:  Normal  Speech/Language:   NA  Affect:  Appropriate  Mood:  normal  Thought process:  normal  Thought content:    WNL  Sensory/Perceptual disturbances:    WNL  Orientation:  oriented to person, place, time/date, situation, and day of week  Attention:  Good  Concentration:  Good  Memory:  WNL  Fund of knowledge:   Good  Insight:    Good  Judgment:   Good  Impulse  Control:  Good    Individualized Treatment Plan                Strengths: bright, verbal, motivated, self aware, self advocate   Supports: spouse, family, friends, colleagues   Goal/Needs for Treatment:  In order of importance to patient  1) Develop healthy interpersonal relationships that lead to the alleviation and help prevent the relapse of depression.  2) Learn and implement coping skills that result in a reduction of anxiety and worry, and improved functioning.   3) Combine skills learned in therapy into a new daily approach to managing ADHD.   Client Statement of Needs: requests assistance with piecing back together her life as it once was before she got depressed (burnt out).   Treatment Level: Weekly Individual Outpatient Psychotherapy.  Symptoms: autonomic hyperactivity (e.g., palpitations, shortness of breath, dry mouth, trouble swallowing, nausea, diarrhea).  Childhood history of Attention Deficit Disorder (ADD) that was either diagnosed or later concluded due to the symptoms of behavioral problems at school, impulsivity, temper outbursts, and lack of concentration.  Depressed or irritable mood. Diminished interest in or enjoyment of activities. Easily distracted and drawn from task at hand.  Excessive and/or unrealistic worry that is difficult to control occurring more days than not for at least 6 months about a number of events or activities.  Feelings of hopelessness, worthlessness, or inappropriate guilt.  Lack of  energy.   Poor concentration and indecisiveness.  Restless and fidgety; unable to be sedentary for more than a short time.   Client Treatment Preferences: Continue with current therapist   Healthcare consumer's goal for treatment:  Psychologist, Olivia [name], Ph.D. will support the patient's ability to achieve the goals identified. Cognitive Behavioral Therapy, Dialectical Behavioral Therapy, Motivational Interviewing, SPACE, parent training, and other  evidenced-based practices will be used to promote progress towards healthy functioning.   Healthcare consumer Olivia [name] will: Actively participate in therapy, working towards healthy functioning.    *Justification for Continuation/Discontinuation of Goal: R=Revised, O=Ongoing, A=Achieved, D=Discontinued  Goal 1) Develop healthy interpersonal relationships that lead to the alleviation and help prevent the relapse of depression.  5 Point Likert rating baseline date: 05/28/2021 Target Date Goal Was reviewed Status Code Progress towards goal/Likert rating  05/29/2023 05/28/2022         O 3/5 - learning to set better limits and boundaries, still hasn't been able to regulate her daily routines  05/25/2024 05/26/2023         O        3.75 - pt reestablished friendship but has not be consistent in reaching out, has improved her relationship with her parents with appropriate limits.  Is more accepting of her relationship with her estranged daughter as healthy for her and not a failure        Goal 2) Learn and implement coping skills that result in a reduction of anxiety and worry, and improved daily functioning.  5 Point Likert rating baseline date: 05/28/2021  Target Date Goal Was reviewed Status Code Progress towards goal/Likert rating  05/29/2023 05/28/2022          O 2/5 - learning to use her skills more consistently  05/25/2024 05/26/2023          O 3.5 - pt is learning to use her skills and better manage her unrealistic and perfectionist tendencies that keep her stressed and anxious        Goal 3) Combine skills learned in therapy into a new daily approach to managing ADHD.  5 Point Likert rating baseline date: 05/28/2021  Target Date Goal Was reviewed Status Code Progress towards goal/Likert rating  05/29/2023 05/28/2022          O 2/5 - learning to use her skills more consistently  05/25/2024 05/26/2023          O 3.75 - accepted the need and began medication, has been more aware, recognize,  acknowledge and accept the issues related ADHD and is better able to apply strategies. Pt is kinder to herself around her "limitations" or need for an alternative way of approaching tasks        This plan has been reviewed and created by the following participants:  This plan will be reviewed at least every 12 months. Date Behavioral Olivia Clinician Date Guardian/Patient   05/28/2022 Olivia [name], Ph.D.   05/28/2022 [name]  05/26/2023 Olivia [name], Ph.D.  05/26/2023 Santina Evans "[name]" Andrey Farmer              Diagnosis: Major Depressive Disorder, recurrent, mild Generalized Anxiety Disorder Attention Deficit Disorder, predominantly inattentive   [name] reports that for the first time she was able to admit to another colleague that she was struggling with PTSD.  We d/e/p what occurred, how she felt and the response she received.  We d/ the positives that came from their d/ and that she finds herself motivated  for a new project she has been "dreaming up."  I noted that this was a sign of improvement.  We d/p that she has worked on other areas in her life, and that as these have stabilized, she is now ready to focus on extra-work activities.  Lastly, we d/ & p/s the challenges she will face in keeping her mother emotionally regulated while they wait for her father to get through surgery tomorrow.   Olivia Favors, PhD  Friend - Amy  Super Ego - Kathie Rhodes

## 2023-08-11 ENCOUNTER — Ambulatory Visit (INDEPENDENT_AMBULATORY_CARE_PROVIDER_SITE_OTHER): Payer: 59 | Admitting: Psychology

## 2023-08-11 DIAGNOSIS — F330 Major depressive disorder, recurrent, mild: Secondary | ICD-10-CM

## 2023-08-11 DIAGNOSIS — F900 Attention-deficit hyperactivity disorder, predominantly inattentive type: Secondary | ICD-10-CM | POA: Diagnosis not present

## 2023-08-11 DIAGNOSIS — F4310 Post-traumatic stress disorder, unspecified: Secondary | ICD-10-CM

## 2023-08-11 DIAGNOSIS — F411 Generalized anxiety disorder: Secondary | ICD-10-CM

## 2023-08-11 NOTE — Progress Notes (Unsigned)
PROGRESS NOTE:    Name: Olivia [name] Date: 08/11/2023 MRN: [id number] DOB: 1966/08/18 PCP: Willow Ora, MD  Time spent: 4:00 PM - 4:58 PM  Annual Review: 05/25/2024  Today I met with  [name] in remote video (Caregility) face-to-face individual psychotherapy.  Distance Site: [name]: Dr Odette Horns Remote Office Consent: Obtained verbal consent to transmit session remotely.   Patient is aware of the limitations inherent in participating in virtual therapy.   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 She is normally a high functioning and driven individual and it is a struggle to get motivated to do anything. If it is necessary for work she does it and does a good job, but it is not the same. She has gained 70 pounds in less than 9 months and it has impacted her self image. She is also peri menopausal and these hormonal changes are also likely to be contributing to her changing mood states.   A year and a half ago her father had quadrupel by-pass surgery and he was recently diagnosed with cancer. 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  Thessaly states that she was diagnosed with adult ADHD and responded well to medications.  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)   Son: Health visitor (15) he is on the ASD, he is a Holiday representative and attends [employer]   Mental Status Exam: Appearance:   Casual     Behavior:  Appropriate  Motor:  Normal  Speech/Language:   NA  Affect:  Appropriate  Mood:  normal  Thought process:  normal  Thought content:    WNL  Sensory/Perceptual disturbances:    WNL  Orientation:  oriented to person, place, time/date, situation, and day of week  Attention:  Good  Concentration:  Good  Memory:  WNL  Fund of knowledge:   Good  Insight:    Good  Judgment:   Good  Impulse  Control:  Good    Individualized Treatment Plan                Strengths: bright, verbal, motivated, self aware, self advocate   Supports: spouse, family, friends, colleagues   Goal/Needs for Treatment:  In order of importance to patient  1) Develop healthy interpersonal relationships that lead to the alleviation and help prevent the relapse of depression.  2) Learn and implement coping skills that result in a reduction of anxiety and worry, and improved functioning.   3) Combine skills learned in therapy into a new daily approach to managing ADHD.   Client Statement of Needs: requests assistance with piecing back together her life as it once was before she got depressed (burnt out).   Treatment Level: Weekly Individual Outpatient Psychotherapy.  Symptoms: autonomic hyperactivity (e.g., palpitations, shortness of breath, dry mouth, trouble swallowing, nausea, diarrhea).  Childhood history of Attention Deficit Disorder (ADD) that was either diagnosed or later concluded due to the symptoms of behavioral problems at school, impulsivity, temper outbursts, and lack of concentration.  Depressed or irritable mood. Diminished interest in or enjoyment of activities. Easily distracted and drawn from task at hand.  Excessive and/or unrealistic worry that is difficult to control occurring more days than not for at least 6 months about a number of events or activities.  Feelings of hopelessness, worthlessness, or inappropriate guilt.  Lack of  energy.   Poor concentration and indecisiveness.  Restless and fidgety; unable to be sedentary for more than a short time.   Client Treatment Preferences: Continue with current therapist   Healthcare consumer's goal for treatment:  Psychologist, [name], Ph.D. will support the patient's ability to achieve the goals identified. Cognitive Behavioral Therapy, Dialectical Behavioral Therapy, Motivational Interviewing, SPACE, parent training, and other  evidenced-based practices will be used to promote progress towards healthy functioning.   Healthcare consumer Veralyn "[name]" Betters will: Actively participate in therapy, working towards healthy functioning.    *Justification for Continuation/Discontinuation of Goal: R=Revised, O=Ongoing, A=Achieved, D=Discontinued  Goal 1) Develop healthy interpersonal relationships that lead to the alleviation and help prevent the relapse of depression.  5 Point Likert rating baseline date: 05/28/2021 Target Date Goal Was reviewed Status Code Progress towards goal/Likert rating  05/29/2023 05/28/2022         O 3/5 - learning to set better limits and boundaries, still hasn't been able to regulate her daily routines  05/25/2024 05/26/2023         O        3.75 - pt reestablished friendship but has not be consistent in reaching out, has improved her relationship with her parents with appropriate limits.  Is more accepting of her relationship with her estranged daughter as healthy for her and not a failure        Goal 2) Learn and implement coping skills that result in a reduction of anxiety and worry, and improved daily functioning.  5 Point Likert rating baseline date: 05/28/2021  Target Date Goal Was reviewed Status Code Progress towards goal/Likert rating  05/29/2023 05/28/2022          O 2/5 - learning to use her skills more consistently  05/25/2024 05/26/2023          O 3.5 - pt is learning to use her skills and better manage her unrealistic and perfectionist tendencies that keep her stressed and anxious        Goal 3) Combine skills learned in therapy into a new daily approach to managing ADHD.  5 Point Likert rating baseline date: 05/28/2021  Target Date Goal Was reviewed Status Code Progress towards goal/Likert rating  05/29/2023 05/28/2022          O 2/5 - learning to use her skills more consistently  05/25/2024 05/26/2023          O 3.75 - accepted the need and began medication, has been more aware, recognize,  acknowledge and accept the issues related ADHD and is better able to apply strategies. Pt is kinder to herself around her "limitations" or need for an alternative way of approaching tasks        This plan has been reviewed and created by the following participants:  This plan will be reviewed at least every 12 months. Date Behavioral Health Clinician Date Guardian/Patient   05/28/2022 [name], Ph.D.   05/28/2022 [name]  05/26/2023 [name], Ph.D.  05/26/2023 Santina Evans "[name]" Andrey Farmer              Diagnosis: Major Depressive Disorder, recurrent, mild Generalized Anxiety Disorder Attention Deficit Disorder, predominantly inattentive   [name] reports that her anxiety was high while she was trying to deal with an unexpected and very costly repair issue.  We d/p what occurred and how she used her skills to get herself emotionally regulated.  It was a stressful week for the entire family and she took  the role of co-regulating the men in the household.  Lastly, we d/p how her father's surgery went and the troubles she had managing her mother's difficult behavior.   [name], PhD  Friend - Amy  Super Ego - Kathie Rhodes

## 2023-08-18 ENCOUNTER — Other Ambulatory Visit (HOSPITAL_BASED_OUTPATIENT_CLINIC_OR_DEPARTMENT_OTHER): Payer: Self-pay

## 2023-08-18 ENCOUNTER — Ambulatory Visit: Payer: 59 | Admitting: Psychology

## 2023-08-20 ENCOUNTER — Ambulatory Visit: Payer: 59 | Admitting: Psychology

## 2023-08-25 ENCOUNTER — Ambulatory Visit: Payer: 59 | Admitting: Psychology

## 2023-08-25 DIAGNOSIS — F411 Generalized anxiety disorder: Secondary | ICD-10-CM

## 2023-08-25 DIAGNOSIS — F330 Major depressive disorder, recurrent, mild: Secondary | ICD-10-CM | POA: Diagnosis not present

## 2023-08-25 DIAGNOSIS — F900 Attention-deficit hyperactivity disorder, predominantly inattentive type: Secondary | ICD-10-CM

## 2023-08-25 NOTE — Progress Notes (Unsigned)
PROGRESS NOTE:    Name: Olivia [name] Date: 08/25/2023 MRN: [id number] DOB: 07-19-1966 PCP: Willow Ora, MD  Time spent: 4:00 PM - 4:58 PM  Annual Review: 05/25/2024  Today I met with  [name] in remote video (Caregility) face-to-face individual psychotherapy.  Distance Site: [name]: Dr Odette Horns Remote Office Consent: Obtained verbal consent to transmit session remotely.   Patient is aware of the limitations inherent in participating in virtual therapy.   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 She is normally a high functioning and driven individual and it is a struggle to get motivated to do anything. If it is necessary for work she does it and does a good job, but it is not the same. She has gained 70 pounds in less than 9 months and it has impacted her self image. She is also peri menopausal and these hormonal changes are also likely to be contributing to her changing mood states.   A year and a half ago her father had quadrupel by-pass surgery and he was recently diagnosed with cancer. 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  [place] states that she was diagnosed with adult ADHD and responded well to medications.  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)   Son: Health visitor (15) he is on the ASD, he is a Holiday representative and attends [employer]   Mental Status Exam: Appearance:   Casual     Behavior:  Appropriate  Motor:  Normal  Speech/Language:   NA  Affect:  Appropriate  Mood:  normal  Thought process:  normal  Thought content:    WNL  Sensory/Perceptual disturbances:    WNL  Orientation:  oriented to person, place, time/date, situation, and day of week  Attention:  Good  Concentration:  Good  Memory:  WNL  Fund of knowledge:   Good  Insight:    Good  Judgment:   Good   Impulse Control:  Good    Individualized Treatment Plan                Strengths: bright, verbal, motivated, self aware, self advocate   Supports: spouse, family, friends, colleagues   Goal/Needs for Treatment:  In order of importance to patient  1) Develop healthy interpersonal relationships that lead to the alleviation and help prevent the relapse of depression.  2) Learn and implement coping skills that result in a reduction of anxiety and worry, and improved functioning.   3) Combine skills learned in therapy into a new daily approach to managing ADHD.   Client Statement of Needs: requests assistance with piecing back together her life as it once was before she got depressed (burnt out).   Treatment Level: Weekly Individual Outpatient Psychotherapy.  Symptoms: autonomic hyperactivity (e.g., palpitations, shortness of breath, dry mouth, trouble swallowing, nausea, diarrhea).  Childhood history of Attention Deficit Disorder (ADD) that was either diagnosed or later concluded due to the symptoms of behavioral problems at school, impulsivity, temper outbursts, and lack of concentration.  Depressed or irritable mood. Diminished interest in or enjoyment of activities. Easily distracted and drawn from task at hand.  Excessive and/or unrealistic worry that is difficult to control occurring more days than not for at least 6 months about a number of events or activities.  Feelings of hopelessness, worthlessness, or inappropriate guilt.  Lack of  energy.   Poor concentration and indecisiveness.  Restless and fidgety; unable to be sedentary for more than a short time.   Client Treatment Preferences: Continue with current therapist   Healthcare consumer's goal for treatment:  Psychologist, [name], Ph.D. will support the patient's ability to achieve the goals identified. Cognitive Behavioral Therapy, Dialectical Behavioral Therapy, Motivational Interviewing, SPACE, parent training,  and other evidenced-based practices will be used to promote progress towards healthy functioning.   Healthcare consumer Mazie "[name]" [place] will: Actively participate in therapy, working towards healthy functioning.    *Justification for Continuation/Discontinuation of Goal: R=Revised, O=Ongoing, A=Achieved, D=Discontinued  Goal 1) Develop healthy interpersonal relationships that lead to the alleviation and help prevent the relapse of depression.  5 Point Likert rating baseline date: 05/28/2021 Target Date Goal Was reviewed Status Code Progress towards goal/Likert rating  05/29/2023 05/28/2022         O 3/5 - learning to set better limits and boundaries, still hasn't been able to regulate her daily routines  05/25/2024 05/26/2023         O        3.75 - pt reestablished friendship but has not be consistent in reaching out, has improved her relationship with her parents with appropriate limits.  Is more accepting of her relationship with her estranged daughter as healthy for her and not a failure        Goal 2) Learn and implement coping skills that result in a reduction of anxiety and worry, and improved daily functioning.  5 Point Likert rating baseline date: 05/28/2021  Target Date Goal Was reviewed Status Code Progress towards goal/Likert rating  05/29/2023 05/28/2022          O 2/5 - learning to use her skills more consistently  05/25/2024 05/26/2023          O 3.5 - pt is learning to use her skills and better manage her unrealistic and perfectionist tendencies that keep her stressed and anxious        Goal 3) Combine skills learned in therapy into a new daily approach to managing ADHD.  5 Point Likert rating baseline date: 05/28/2021  Target Date Goal Was reviewed Status Code Progress towards goal/Likert rating  05/29/2023 05/28/2022          O 2/5 - learning to use her skills more consistently  05/25/2024 05/26/2023          O 3.75 - accepted the need and began medication, has been more aware,  recognize, acknowledge and accept the issues related ADHD and is better able to apply strategies. Pt is kinder to herself around her "limitations" or need for an alternative way of approaching tasks        This plan has been reviewed and created by the following participants:  This plan will be reviewed at least every 12 months. Date Behavioral Health Clinician Date Guardian/Patient   05/28/2022 [name], Ph.D.   05/28/2022 [name]  05/26/2023 [name], Ph.D.  05/26/2023 Santina Evans "[name]" Andrey Farmer              Diagnosis: Major Depressive Disorder, recurrent, mild Generalized Anxiety Disorder Attention Deficit Disorder, predominantly inattentive   [name] reports that she had the "headaches of all headaches" last week.  We d/ that she did a big job of not pushing herself and was better about managing her expectations.  Lastly, we d/ a family problem related to her parents, her sister, wills and inheritance.  [name], PhD  Friend - Amy  Super Ego - Kathie Rhodes

## 2023-08-27 ENCOUNTER — Other Ambulatory Visit (HOSPITAL_BASED_OUTPATIENT_CLINIC_OR_DEPARTMENT_OTHER): Payer: Self-pay

## 2023-08-29 ENCOUNTER — Other Ambulatory Visit: Payer: Self-pay

## 2023-08-29 NOTE — Progress Notes (Signed)
Specialty Pharmacy Refill Coordination Note  Olivia [name] is a 57 y.o. female contacted today regarding refills of specialty medication(s) Ixekizumab   Patient requested Delivery   Delivery date: 09/03/23   Verified address: [address] [place], [place], [address]   Medication will be filled on 10/29.

## 2023-08-30 ENCOUNTER — Other Ambulatory Visit (HOSPITAL_COMMUNITY): Payer: Self-pay

## 2023-09-01 ENCOUNTER — Ambulatory Visit (INDEPENDENT_AMBULATORY_CARE_PROVIDER_SITE_OTHER): Payer: 59 | Admitting: Psychology

## 2023-09-01 ENCOUNTER — Other Ambulatory Visit: Payer: Self-pay

## 2023-09-01 DIAGNOSIS — F330 Major depressive disorder, recurrent, mild: Secondary | ICD-10-CM

## 2023-09-01 DIAGNOSIS — F411 Generalized anxiety disorder: Secondary | ICD-10-CM

## 2023-09-01 DIAGNOSIS — F900 Attention-deficit hyperactivity disorder, predominantly inattentive type: Secondary | ICD-10-CM | POA: Diagnosis not present

## 2023-09-01 NOTE — Progress Notes (Signed)
PROGRESS NOTE:    Name: Olivia [name] Date: 09/01/2023 MRN: [id number] DOB: 1965/11/20 PCP: Willow Ora, MD  Time spent: 4:00 PM - 4:58 PM  Annual Review: 05/25/2024  Today I met with  [name] in remote video (Caregility) face-to-face individual psychotherapy.  Distance Site: [name]: Dr Odette Horns Remote Office Consent: Obtained verbal consent to transmit session remotely.   Patient is aware of the limitations inherent in participating in virtual therapy.   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 She is normally a high functioning and driven individual and it is a struggle to get motivated to do anything. If it is necessary for work she does it and does a good job, but it is not the same. She has gained 70 pounds in less than 9 months and it has impacted her self image. She is also peri menopausal and these hormonal changes are also likely to be contributing to her changing mood states.   A year and a half ago her father had quadrupel by-pass surgery and he was recently diagnosed with cancer. 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  Gavriella states that she was diagnosed with adult ADHD and responded well to medications.  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)   Son: Health visitor (15) he is on the ASD, he is a Holiday representative and attends [employer]   Mental Status Exam: Appearance:   Casual     Behavior:  Appropriate  Motor:  Normal  Speech/Language:   NA  Affect:  Appropriate  Mood:  normal  Thought process:  normal  Thought content:    WNL  Sensory/Perceptual disturbances:    WNL  Orientation:  oriented to person, place, time/date, situation, and day of week  Attention:  Good  Concentration:  Good  Memory:  WNL  Fund of knowledge:   Good  Insight:    Good  Judgment:   Good   Impulse Control:  Good    Individualized Treatment Plan                Strengths: bright, verbal, motivated, self aware, self advocate   Supports: spouse, family, friends, colleagues   Goal/Needs for Treatment:  In order of importance to patient  1) Develop healthy interpersonal relationships that lead to the alleviation and help prevent the relapse of depression.  2) Learn and implement coping skills that result in a reduction of anxiety and worry, and improved functioning.   3) Combine skills learned in therapy into a new daily approach to managing ADHD.   Client Statement of Needs: requests assistance with piecing back together her life as it once was before she got depressed (burnt out).   Treatment Level: Weekly Individual Outpatient Psychotherapy.  Symptoms: autonomic hyperactivity (e.g., palpitations, shortness of breath, dry mouth, trouble swallowing, nausea, diarrhea).  Childhood history of Attention Deficit Disorder (ADD) that was either diagnosed or later concluded due to the symptoms of behavioral problems at school, impulsivity, temper outbursts, and lack of concentration.  Depressed or irritable mood. Diminished interest in or enjoyment of activities. Easily distracted and drawn from task at hand.  Excessive and/or unrealistic worry that is difficult to control occurring more days than not for at least 6 months about a number of events or activities.  Feelings of hopelessness, worthlessness, or inappropriate guilt.  Lack  of energy.   Poor concentration and indecisiveness.  Restless and fidgety; unable to be sedentary for more than a short time.   Client Treatment Preferences: Continue with current therapist   Healthcare consumer's goal for treatment:  Psychologist, [name], Ph.D. will support the patient's ability to achieve the goals identified. Cognitive Behavioral Therapy, Dialectical Behavioral Therapy, Motivational Interviewing, SPACE, parent training,  and other evidenced-based practices will be used to promote progress towards healthy functioning.   Healthcare consumer Copelynn "[name]" Reinschmidt will: Actively participate in therapy, working towards healthy functioning.    *Justification for Continuation/Discontinuation of Goal: R=Revised, O=Ongoing, A=Achieved, D=Discontinued  Goal 1) Develop healthy interpersonal relationships that lead to the alleviation and help prevent the relapse of depression.  5 Point Likert rating baseline date: 05/28/2021 Target Date Goal Was reviewed Status Code Progress towards goal/Likert rating  05/29/2023 05/28/2022         O 3/5 - learning to set better limits and boundaries, still hasn't been able to regulate her daily routines  05/25/2024 05/26/2023         O        3.75 - pt reestablished friendship but has not be consistent in reaching out, has improved her relationship with her parents with appropriate limits.  Is more accepting of her relationship with her estranged daughter as healthy for her and not a failure        Goal 2) Learn and implement coping skills that result in a reduction of anxiety and worry, and improved daily functioning.  5 Point Likert rating baseline date: 05/28/2021  Target Date Goal Was reviewed Status Code Progress towards goal/Likert rating  05/29/2023 05/28/2022          O 2/5 - learning to use her skills more consistently  05/25/2024 05/26/2023          O 3.5 - pt is learning to use her skills and better manage her unrealistic and perfectionist tendencies that keep her stressed and anxious        Goal 3) Combine skills learned in therapy into a new daily approach to managing ADHD.  5 Point Likert rating baseline date: 05/28/2021  Target Date Goal Was reviewed Status Code Progress towards goal/Likert rating  05/29/2023 05/28/2022          O 2/5 - learning to use her skills more consistently  05/25/2024 05/26/2023          O 3.75 - accepted the need and began medication, has been more aware,  recognize, acknowledge and accept the issues related ADHD and is better able to apply strategies. Pt is kinder to herself around her "limitations" or need for an alternative way of approaching tasks        This plan has been reviewed and created by the following participants:  This plan will be reviewed at least every 12 months. Date Behavioral Health Clinician Date Guardian/Patient   05/28/2022 [name], Ph.D.   05/28/2022 [name]  05/26/2023 [name], Ph.D.  05/26/2023 Santina Evans "[name]" Andrey Farmer              Diagnosis: Major Depressive Disorder, recurrent, mild Generalized Anxiety Disorder Attention Deficit Disorder, predominantly inattentive   [name] reports that she had the "headaches of all headaches" last week.  We d/ that she did a big job of not pushing herself and was better about managing her expectations.  Lastly, we d/ a family problem related to her parents, her sister, wills and inheritance.  [name], PhD  Friend - Amy  Super Ego - Kathie Rhodes

## 2023-09-02 ENCOUNTER — Other Ambulatory Visit: Payer: Self-pay

## 2023-09-02 NOTE — Progress Notes (Signed)
Pharmacy Patient Advocate Encounter   Received notification from Patient Pharmacy that prior authorization for Altamease Oiler is required/requested.   Insurance verification completed.   The patient is insured through [facility] .   Per test claim: PA required; PA submitted to [facility] via CoverMyMeds Key/confirmation #/EOC  [id number] Status is pending

## 2023-09-03 ENCOUNTER — Other Ambulatory Visit: Payer: Self-pay

## 2023-09-03 ENCOUNTER — Encounter (HOSPITAL_BASED_OUTPATIENT_CLINIC_OR_DEPARTMENT_OTHER): Payer: Self-pay

## 2023-09-03 ENCOUNTER — Emergency Department (HOSPITAL_BASED_OUTPATIENT_CLINIC_OR_DEPARTMENT_OTHER): Payer: 59 | Admitting: Radiology

## 2023-09-03 ENCOUNTER — Other Ambulatory Visit (HOSPITAL_BASED_OUTPATIENT_CLINIC_OR_DEPARTMENT_OTHER): Payer: Self-pay

## 2023-09-03 ENCOUNTER — Emergency Department (HOSPITAL_BASED_OUTPATIENT_CLINIC_OR_DEPARTMENT_OTHER)
Admission: EM | Admit: 2023-09-03 | Discharge: 2023-09-04 | Disposition: A | Discharge status: Home / Self Care | Payer: 59 | Attending: Emergency Medicine | Admitting: Emergency Medicine

## 2023-09-03 DIAGNOSIS — J45909 Unspecified asthma, uncomplicated: Secondary | ICD-10-CM | POA: Diagnosis not present

## 2023-09-03 DIAGNOSIS — R0789 Other chest pain: Secondary | ICD-10-CM | POA: Diagnosis not present

## 2023-09-03 DIAGNOSIS — R079 Chest pain, unspecified: Secondary | ICD-10-CM | POA: Diagnosis not present

## 2023-09-03 DIAGNOSIS — I2510 Atherosclerotic heart disease of native coronary artery without angina pectoris: Secondary | ICD-10-CM | POA: Diagnosis not present

## 2023-09-03 LAB — CBC
HCT: 46.3 % — ABNORMAL HIGH (ref 36.0–46.0)
Hemoglobin: 15.6 g/dL — ABNORMAL HIGH (ref 12.0–15.0)
MCH: 26.9 pg (ref 26.0–34.0)
MCHC: 33.7 g/dL (ref 30.0–36.0)
MCV: 79.8 fL — ABNORMAL LOW (ref 80.0–100.0)
Platelets: 347 10*3/uL (ref 150–400)
RBC: 5.8 MIL/uL — ABNORMAL HIGH (ref 3.87–5.11)
RDW: 13.5 % (ref 11.5–15.5)
WBC: 10.2 10*3/uL (ref 4.0–10.5)
nRBC: 0 % (ref 0.0–0.2)

## 2023-09-03 LAB — PREGNANCY, URINE: Preg Test, Ur: NEGATIVE

## 2023-09-03 LAB — TROPONIN I (HIGH SENSITIVITY): Troponin I (High Sensitivity): 5 ng/L (ref <18)

## 2023-09-03 MED ORDER — KETOROLAC TROMETHAMINE 30 MG/ML IJ SOLN
30.0000 mg | Freq: Once | INTRAMUSCULAR | Status: AC
  Administered 2023-09-03: 30 mg via INTRAVENOUS
  Filled 2023-09-03: qty 1

## 2023-09-03 MED ORDER — ONDANSETRON HCL 4 MG/2ML IJ SOLN
4.0000 mg | Freq: Once | INTRAMUSCULAR | Status: AC | PRN
  Administered 2023-09-03: 4 mg via INTRAVENOUS
  Filled 2023-09-03: qty 2

## 2023-09-03 MED ORDER — INFLUENZA VIRUS VACC SPLIT PF (FLUZONE) 0.5 ML IM SUSY
0.5000 mL | PREFILLED_SYRINGE | Freq: Once | INTRAMUSCULAR | 0 refills | Status: AC
  Filled 2023-09-03: qty 0.5, 1d supply, fill #0

## 2023-09-03 NOTE — ED Notes (Signed)
Patient reports left shoulder pain that started two days ago with radiation to left jaw pain and left arm numbness. Patient reports that she took 1 nitroglycerin prior to coming to ED and the pain went away. Patient denies any cardiac history.

## 2023-09-03 NOTE — ED Triage Notes (Signed)
Pt POV from home reporting L shoulder, arm, and jaw pain, that began a couple days ago, took husbands nitroglycerin tonight w improvement, then pain returned

## 2023-09-03 NOTE — ED Provider Notes (Signed)
[place] DE[facility]MENT AT [facility] Provider Note   CSN: [id number] Arrival date & time: 09/03/23  2250     History  No chief complaint on file.   Olivia [name] is a 57 y.o. female.  Patient is a 57 year old female with past medical history of hyperlipidemia, asthma, PCOS, parathyroidectomy.  Patient presenting today for evaluation of chest and arm discomfort.  This has been ongoing for approximately 1 week.  She describes intermittent discomfort to the left upper chest radiating down her left arm.  This comes and goes at random with no associated shortness of breath, nausea, or diaphoresis.  She denies any exertional component.  She reports taking her husband's nitroglycerin this evening which did seem to help somewhat.  Patient has no prior cardiac history, but did have a calcium scoring scan approximately 1 year ago.  This showed mild CAD.  The history is provided by the patient.       Home Medications Prior to Admission medications   Medication Sig Start Date End Date Taking? Authorizing Provider  albuterol (PROVENTIL HFA) 108 (90 Base) MCG/ACT inhaler Inhale 2 puffs into the lungs every 6 (six) hours as needed for wheezing or shortness of breath. 07/30/21   Willow Ora, MD  albuterol (PROVENTIL) (2.5 MG/3ML) 0.083% nebulizer solution Take 3 mLs (2.5 mg total) by nebulization every 6 (six) hours as needed for wheezing or shortness of breath. 11/22/22   Willow Ora, MD  ALPRAZolam Prudy Feeler) 0.5 MG tablet Take 1 tablet (0.5 mg total) by mouth 2 (two) times daily as needed for anxiety. 08/07/20   Willow Ora, MD  buPROPion (WELLBUTRIN XL) 150 MG 24 hr tablet Take 1 tablet (150 mg total) by mouth daily. 11/21/22   Willow Ora, MD  cetirizine (ZYRTEC) 10 MG tablet Take 10 mg by mouth daily as needed for allergies.     [provider]  clobetasol cream (TEMOVATE) 0.05 % Apply 1 application Externally Twice a day 14 days 03/21/23     clobetasol  ointment (TEMOVATE) 0.05 % Apply 1 application Externally Twice a day 14 days 03/21/23     escitalopram (LEXAPRO) 20 MG tablet Take 1 tablet (20 mg total) by mouth daily. 09/18/22   Willow Ora, MD  famotidine (PEPCID) 20 MG tablet Take 20 mg by mouth daily as needed for heartburn or indigestion.    [provider]  influenza vac split trivalent PF (FLULAVAL) 0.5 ML injection Inject 0.5 mLs into the muscle once for 1 dose. 09/03/23 09/04/23  Judyann Munson, MD  ixekizumab (TALTZ) 80 MG/ML pen Inject as directed subcutaneous once every 4 weeks 30 days 06/11/23   Quentin Angst, MD  levothyroxine (SYNTHROID) 112 MCG tablet Take 1 tablet (112 mcg total) by mouth daily. 05/16/22     levothyroxine (SYNTHROID) 200 MCG tablet Take 1 tablet (200 mcg total) by mouth daily. 04/10/23     levothyroxine (SYNTHROID) 50 MCG tablet Take 1 tablet (50 mcg total) by mouth daily in the morning. 08/04/23     liothyronine (CYTOMEL) 25 MCG tablet Take 1 tablet by mouth once daily. 05/20/22     liothyronine (CYTOMEL) 25 MCG tablet Take 1.5 tablets (37.5 mcg total) by mouth daily. 04/07/23   Ferdinand Cava, MD  liothyronine (CYTOMEL) 25 MCG tablet Take 1.5 tablets (37.5 mcg total) by mouth daily. 04/07/23     metoprolol tartrate (LOPRESSOR) 25 MG tablet Take 12.5 mg by mouth daily as needed. 01/17/21   Willow Ora,  MD  metroNIDAZOLE (METROCREAM) 0.75 % cream Apply 1 Application topically 2 (two) times daily. 06/03/23     Multiple Vitamin (MULITIVITAMIN WITH MINERALS) TABS Take 1 tablet by mouth daily.    [provider]  progesterone (PROMETRIUM) 100 MG capsule Take 1 capsule (100 mg total) by mouth at bedtime. 08/04/23     rosuvastatin (CRESTOR) 10 MG tablet Take 1 tablet (10 mg total) by mouth at bedtime. 03/18/23   Willow Ora, MD  triamcinolone cream (KENALOG) 0.1 % Apply as directed to affected area twice a day for 10-14 days as needed for flares 10/16/22     Vitamin D, Cholecalciferol, 50 MCG (2000  UT) CAPS Take 2,000 Units by mouth daily.    [provider]      Allergies    Cephalosporins, Egg-derived products, Penicillins, Sulfa drugs cross reactors, Adhesive [tape], and Effexor [venlafaxine]    Review of Systems   Review of Systems  All other systems reviewed and are negative.   Physical Exam Updated Vital Signs BP 127/78 (BP Location: Right Arm)   Pulse (!) 111   Temp 98.4 F (36.9 C)   Resp 18   Ht 5\' 9"  (1.753 m)   Wt (!) 149.7 kg   SpO2 97%   BMI 48.73 kg/m  Physical Exam Vitals and nursing note reviewed.  Constitutional:      General: She is not in acute distress.    Appearance: She is well-developed. She is not diaphoretic.  HENT:     Head: Normocephalic and atraumatic.  Cardiovascular:     Rate and Rhythm: Normal rate and regular rhythm.     Heart sounds: No murmur heard.    No friction rub. No gallop.  Pulmonary:     Effort: Pulmonary effort is normal. No respiratory distress.     Breath sounds: Normal breath sounds. No wheezing.  Abdominal:     General: Bowel sounds are normal. There is no distension.     Palpations: Abdomen is soft.     Tenderness: There is no abdominal tenderness.  Musculoskeletal:        General: Normal range of motion.     Cervical back: Normal range of motion and neck supple.  Skin:    General: Skin is warm and dry.  Neurological:     General: No focal deficit present.     Mental Status: She is alert and oriented to person, place, and time.     ED Results / Procedures / Treatments   Labs (all labs ordered are listed, but only abnormal results are displayed) Labs Reviewed  CBC - Abnormal; Notable for the following components:      Result Value   RBC 5.80 (*)    Hemoglobin 15.6 (*)    HCT 46.3 (*)    MCV 79.8 (*)    All other components within normal limits  BASIC METABOLIC PANEL  PREGNANCY, URINE  TROPONIN I (HIGH SENSITIVITY)    EKG EKG Interpretation Date/Time:  Wednesday September 03 2023 23:06:10  EDT Ventricular Rate:  101 PR Interval:  148 QRS Duration:  76 QT Interval:  322 QTC Calculation: 417 R Axis:   -30  Text Interpretation: Sinus tachycardia Possible Left atrial enlargement Left axis deviation Pulmonary disease pattern Nonspecific ST and T wave abnormality Abnormal ECG When compared with ECG of 24-May-2020 10:04, no significant change is noted Confirmed by Geoffery Lyons ([id number]) on 09/03/2023 11:19:19 PM  Radiology No results found.  Procedures Procedures  {Document cardiac monitor, telemetry  assessment procedure when appropriate:1}  Medications Ordered in ED Medications  ketorolac (TORADOL) 30 MG/ML injection 30 mg (has no administration in time range)  ondansetron (ZOFRAN) injection 4 mg (4 mg Intravenous Given 09/03/23 2331)    ED Course/ Medical Decision Making/ A&P   {   Click here for ABCD2, HEART and other calculatorsREFRESH Note before signing :1}                              Medical Decision Making Amount and/or Complexity of Data Reviewed Labs: ordered. Radiology: ordered.  Risk Prescription drug management.   ***  {Document critical care time when appropriate:1} {Document review of labs and clinical decision tools ie heart score, Chads2Vasc2 etc:1}  {Document your independent review of radiology images, and any outside records:1} {Document your discussion with family members, caretakers, and with consultants:1} {Document social determinants of health affecting pt's care:1} {Document your decision making why or why not admission, treatments were needed:1} Final Clinical Impression(s) / ED Diagnoses Final diagnoses:  None    Rx / DC Orders ED Discharge Orders     None

## 2023-09-03 NOTE — Progress Notes (Signed)
Pharmacy Patient Advocate Encounter  Received notification from [facility] that Prior Authorization for Altamease Oiler has been APPROVED from 09/02/23 to 08/31/24   PA #/Case ID/Reference #: [id number]

## 2023-09-04 ENCOUNTER — Other Ambulatory Visit: Payer: Self-pay

## 2023-09-04 ENCOUNTER — Other Ambulatory Visit (HOSPITAL_BASED_OUTPATIENT_CLINIC_OR_DEPARTMENT_OTHER): Payer: Self-pay

## 2023-09-04 LAB — BASIC METABOLIC PANEL
Anion gap: 9 (ref 5–15)
BUN: 13 mg/dL (ref 6–20)
CO2: 27 mmol/L (ref 22–32)
Calcium: 10.5 mg/dL — ABNORMAL HIGH (ref 8.9–10.3)
Chloride: 102 mmol/L (ref 98–111)
Creatinine, Ser: 0.76 mg/dL (ref 0.44–1.00)
GFR, Estimated: >60 mL/min (ref >60)
Glucose, Bld: 103 mg/dL — ABNORMAL HIGH (ref 70–99)
Potassium: 4 mmol/L (ref 3.5–5.1)
Sodium: 138 mmol/L (ref 135–145)

## 2023-09-04 MED ORDER — NAPROXEN 500 MG PO TABS
500.0000 mg | ORAL_TABLET | Freq: Two times a day (BID) | ORAL | 0 refills | Status: DC
  Filled 2023-09-04: qty 20, 10d supply, fill #0

## 2023-09-04 NOTE — Discharge Instructions (Signed)
Begin taking naproxen as prescribed.  Follow-up with your primary doctor or cardiologist if not improving in the next few days, and return to the ER if your symptoms significantly worsen or change in the meantime.

## 2023-09-08 ENCOUNTER — Ambulatory Visit (INDEPENDENT_AMBULATORY_CARE_PROVIDER_SITE_OTHER): Payer: 59 | Admitting: Psychology

## 2023-09-08 DIAGNOSIS — F411 Generalized anxiety disorder: Secondary | ICD-10-CM | POA: Diagnosis not present

## 2023-09-08 DIAGNOSIS — F330 Major depressive disorder, recurrent, mild: Secondary | ICD-10-CM | POA: Diagnosis not present

## 2023-09-08 DIAGNOSIS — F900 Attention-deficit hyperactivity disorder, predominantly inattentive type: Secondary | ICD-10-CM | POA: Diagnosis not present

## 2023-09-08 NOTE — Progress Notes (Signed)
PROGRESS NOTE:    Name: Olivia [name] Date: 09/08/2023 MRN: [id number] DOB: 31-Jul-1966 PCP: Willow Ora, MD  Time spent: 4:00 PM - 4:58 PM  Annual Review: 05/25/2024  Today I met with  [name] in remote video (Caregility) face-to-face individual psychotherapy.  Distance Site: [name]: Dr Odette Horns Remote Office Consent: Obtained verbal consent to transmit session remotely.   Patient is aware of the limitations inherent in participating in virtual therapy.   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 She is normally a high functioning and driven individual and it is a struggle to get motivated to do anything. If it is necessary for work she does it and does a good job, but it is not the same. She has gained 70 pounds in less than 9 months and it has impacted her self image. She is also peri menopausal and these hormonal changes are also likely to be contributing to her changing mood states.   A year and a half ago her father had quadrupel by-pass surgery and he was recently diagnosed with cancer. 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  Annalissa states that she was diagnosed with adult ADHD and responded well to medications.  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)   Son: Health visitor (15) he is on the ASD, he is a Holiday representative and attends [employer]   Mental Status Exam: Appearance:   Casual     Behavior:  Appropriate  Motor:  Normal  Speech/Language:   NA  Affect:  Appropriate  Mood:  normal  Thought process:  normal  Thought content:    WNL  Sensory/Perceptual disturbances:    WNL  Orientation:  oriented to person, place, time/date, situation, and day of week  Attention:  Good  Concentration:  Good  Memory:  WNL  Fund of knowledge:   Good  Insight:    Good  Judgment:   Good   Impulse Control:  Good    Individualized Treatment Plan                Strengths: bright, verbal, motivated, self aware, self advocate   Supports: spouse, family, friends, colleagues   Goal/Needs for Treatment:  In order of importance to patient  1) Develop healthy interpersonal relationships that lead to the alleviation and help prevent the relapse of depression.  2) Learn and implement coping skills that result in a reduction of anxiety and worry, and improved functioning.   3) Combine skills learned in therapy into a new daily approach to managing ADHD.   Client Statement of Needs: requests assistance with piecing back together her life as it once was before she got depressed (burnt out).   Treatment Level: Weekly Individual Outpatient Psychotherapy.  Symptoms: autonomic hyperactivity (e.g., palpitations, shortness of breath, dry mouth, trouble swallowing, nausea, diarrhea).  Childhood history of Attention Deficit Disorder (ADD) that was either diagnosed or later concluded due to the symptoms of behavioral problems at school, impulsivity, temper outbursts, and lack of concentration.  Depressed or irritable mood. Diminished interest in or enjoyment of activities. Easily distracted and drawn from task at hand.  Excessive and/or unrealistic worry that is difficult to control occurring more days than not for at least 6 months about a number of events or activities.  Feelings of hopelessness, worthlessness, or inappropriate guilt.  Lack  of energy.   Poor concentration and indecisiveness.  Restless and fidgety; unable to be sedentary for more than a short time.   Client Treatment Preferences: Continue with current therapist   Healthcare consumer's goal for treatment:  Psychologist, [name], Ph.D. will support the patient's ability to achieve the goals identified. Cognitive Behavioral Therapy, Dialectical Behavioral Therapy, Motivational Interviewing, SPACE, parent training,  and other evidenced-based practices will be used to promote progress towards healthy functioning.   Healthcare consumer Greg "[name]" Gaut will: Actively participate in therapy, working towards healthy functioning.    *Justification for Continuation/Discontinuation of Goal: R=Revised, O=Ongoing, A=Achieved, D=Discontinued  Goal 1) Develop healthy interpersonal relationships that lead to the alleviation and help prevent the relapse of depression.  5 Point Likert rating baseline date: 05/28/2021 Target Date Goal Was reviewed Status Code Progress towards goal/Likert rating  05/29/2023 05/28/2022         O 3/5 - learning to set better limits and boundaries, still hasn't been able to regulate her daily routines  05/25/2024 05/26/2023         O        3.75 - pt reestablished friendship but has not be consistent in reaching out, has improved her relationship with her parents with appropriate limits.  Is more accepting of her relationship with her estranged daughter as healthy for her and not a failure        Goal 2) Learn and implement coping skills that result in a reduction of anxiety and worry, and improved daily functioning.  5 Point Likert rating baseline date: 05/28/2021  Target Date Goal Was reviewed Status Code Progress towards goal/Likert rating  05/29/2023 05/28/2022          O 2/5 - learning to use her skills more consistently  05/25/2024 05/26/2023          O 3.5 - pt is learning to use her skills and better manage her unrealistic and perfectionist tendencies that keep her stressed and anxious        Goal 3) Combine skills learned in therapy into a new daily approach to managing ADHD.  5 Point Likert rating baseline date: 05/28/2021  Target Date Goal Was reviewed Status Code Progress towards goal/Likert rating  05/29/2023 05/28/2022          O 2/5 - learning to use her skills more consistently  05/25/2024 05/26/2023          O 3.75 - accepted the need and began medication, has been more aware,  recognize, acknowledge and accept the issues related ADHD and is better able to apply strategies. Pt is kinder to herself around her "limitations" or need for an alternative way of approaching tasks        This plan has been reviewed and created by the following participants:  This plan will be reviewed at least every 12 months. Date Behavioral Health Clinician Date Guardian/Patient   05/28/2022 [name], Ph.D.   05/28/2022 [name]  05/26/2023 [name], Ph.D.  05/26/2023 Santina Evans "[name]" Andrey Farmer              Diagnosis: Major Depressive Disorder, recurrent, mild Generalized Anxiety Disorder Attention Deficit Disorder, predominantly inattentive   [name] reports that she was having some strange medical symptoms that including chest pressure and pain which didn't fit any know pattern that she recognized.  She went to the ED after a recurrence of the pain after feeling better during the day.  [name] is arranging to have  a stress test in the coming week.  We d/e/p her families anxiety, her own anxieties and   [name], PhD  Son: Shanon Ace (17) he is on the ASD Friend - Amy  Super Ego - Kathie Rhodes

## 2023-09-15 ENCOUNTER — Other Ambulatory Visit (HOSPITAL_BASED_OUTPATIENT_CLINIC_OR_DEPARTMENT_OTHER): Payer: Self-pay

## 2023-09-22 ENCOUNTER — Other Ambulatory Visit: Payer: Self-pay | Admitting: Family Medicine

## 2023-09-22 ENCOUNTER — Other Ambulatory Visit (HOSPITAL_BASED_OUTPATIENT_CLINIC_OR_DEPARTMENT_OTHER): Payer: Self-pay

## 2023-09-22 MED ORDER — ESCITALOPRAM OXALATE 20 MG PO TABS
20.0000 mg | ORAL_TABLET | Freq: Every day | ORAL | 3 refills | Status: DC
  Filled 2023-09-22: qty 90, 90d supply, fill #0
  Filled 2023-12-18: qty 90, 90d supply, fill #1
  Filled 2024-03-15: qty 90, 90d supply, fill #2
  Filled 2024-06-17: qty 90, 90d supply, fill #3

## 2023-09-23 ENCOUNTER — Encounter (HOSPITAL_COMMUNITY): Payer: Self-pay

## 2023-09-23 ENCOUNTER — Other Ambulatory Visit: Payer: Self-pay

## 2023-09-23 ENCOUNTER — Other Ambulatory Visit (HOSPITAL_BASED_OUTPATIENT_CLINIC_OR_DEPARTMENT_OTHER): Payer: Self-pay

## 2023-09-23 NOTE — Progress Notes (Signed)
Specialty Pharmacy Ongoing Clinical Assessment Note  Olivia [name] is a 57 y.o. female who is being followed by the specialty pharmacy service for RxSp Psoriasis   Patient's specialty medication(s) reviewed today: Ixekizumab   Missed doses in the last 4 weeks: 0   Patient/Caregiver did not have any additional questions or concerns.   Therapeutic benefit summary: Patient is achieving benefit   Adverse events/side effects summary: No adverse events/side effects   Patient's therapy is appropriate to: Continue    Goals Addressed             This Visit's Progress    Reduce signs and symptoms       Patient is on track. Patient will maintain adherence         Follow up:  6 months  Otto Herb Specialty Pharmacist

## 2023-09-23 NOTE — Progress Notes (Signed)
Specialty Pharmacy Refill Coordination Note  Olivia [name] is a 57 y.o. female contacted today regarding refills of specialty medication(s) Ixekizumab   Patient requested Delivery   Delivery date: 10/01/23   Verified address: [address] [place], [place], [address]   Medication will be filled on 09/30/23.

## 2023-09-29 ENCOUNTER — Ambulatory Visit: Payer: 59 | Admitting: Psychology

## 2023-09-29 DIAGNOSIS — F330 Major depressive disorder, recurrent, mild: Secondary | ICD-10-CM | POA: Diagnosis not present

## 2023-09-29 DIAGNOSIS — F900 Attention-deficit hyperactivity disorder, predominantly inattentive type: Secondary | ICD-10-CM | POA: Diagnosis not present

## 2023-09-29 DIAGNOSIS — F411 Generalized anxiety disorder: Secondary | ICD-10-CM | POA: Diagnosis not present

## 2023-09-29 NOTE — Progress Notes (Signed)
PROGRESS NOTE:    Name: Olivia [name] Date: 09/29/2023 MRN: [id number] DOB: [birth year] PCP: Willow Ora, MD  Time spent: 4:00 PM - 4:58 PM  Annual Review: 05/25/2024  Today I met with  Olivia [name] in remote video (Caregility) face-to-face individual psychotherapy.  Distance Site: [name]: Dr Odette Horns Remote Office Consent: Obtained verbal consent to transmit session remotely.   Patient is aware of the limitations inherent in participating in virtual therapy.   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 She is normally a high functioning and driven individual and it is a struggle to get motivated to do anything. If it is necessary for work she does it and does a good job, but it is not the same. She has gained 70 pounds in less than 9 months and it has impacted her self image. She is also peri menopausal and these hormonal changes are also likely to be contributing to her changing mood states.   A year and a half ago her father had quadrupel by-pass surgery and he was recently diagnosed with cancer. 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  Olivia [name] states that she was diagnosed with adult ADHD and responded well to medications.  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)   [name] he is on the ASD, he is a Holiday representative and attends [employer]   Mental Status Exam: Appearance:   Casual     Behavior:  Appropriate  Motor:  Normal  Speech/Language:   NA  Affect:  Appropriate  Mood:  normal  Thought process:  normal  Thought content:    WNL  Sensory/Perceptual disturbances:    WNL  Orientation:  oriented to person, place, time/date, situation, and day of week  Attention:  Good  Concentration:  Good  Memory:  WNL  Fund of knowledge:   Good  Insight:    Good  Judgment:   Good   Impulse Control:  Good    Individualized Treatment Plan                Strengths: bright, verbal, motivated, self aware, self advocate   Supports: spouse, family, friends, colleagues   Goal/Needs for Treatment:  In order of importance to patient  1) Develop healthy interpersonal relationships that lead to the alleviation and help prevent the relapse of depression.  2) Learn and implement coping skills that result in a reduction of anxiety and worry, and improved functioning.   3) Combine skills learned in therapy into a new daily approach to managing ADHD.   Client Statement of Needs: requests assistance with piecing back together her life as it once was before she got depressed (burnt out).   Treatment Level: Weekly Individual Outpatient Psychotherapy.  Symptoms: autonomic hyperactivity (e.g., palpitations, shortness of breath, dry mouth, trouble swallowing, nausea, diarrhea).  Childhood history of Attention Deficit Disorder (ADD) that was either diagnosed or later concluded due to the symptoms of behavioral problems at school, impulsivity, temper outbursts, and lack of concentration.  Depressed or irritable mood. Diminished interest in or enjoyment of activities. Easily distracted and drawn from task at hand.  Excessive and/or unrealistic worry that is difficult to control occurring more days than not for at least 6 months about a number of events or activities.  Feelings of hopelessness, worthlessness, or inappropriate guilt.  Lack  of energy.   Poor concentration and indecisiveness.  Restless and fidgety; unable to be sedentary for more than a short time.   Client Treatment Preferences: Continue with current therapist   Healthcare consumer's goal for treatment:  Psychologist, [name], Ph.D. will support the patient's ability to achieve the goals identified. Cognitive Behavioral Therapy, Dialectical Behavioral Therapy, Motivational Interviewing, SPACE, parent training,  and other evidenced-based practices will be used to promote progress towards healthy functioning.   Healthcare consumer Olivia [name] will: Actively participate in therapy, working towards healthy functioning.    *Justification for Continuation/Discontinuation of Goal: R=Revised, O=Ongoing, A=Achieved, D=Discontinued  Goal 1) Develop healthy interpersonal relationships that lead to the alleviation and help prevent the relapse of depression.  5 Point Likert rating baseline date: 05/28/2021 Target Date Goal Was reviewed Status Code Progress towards goal/Likert rating  05/29/2023 05/28/2022         O 3/5 - learning to set better limits and boundaries, still hasn't been able to regulate her daily routines  05/25/2024 05/26/2023         O        3.75 - pt reestablished friendship but has not be consistent in reaching out, has improved her relationship with her parents with appropriate limits.  Is more accepting of her relationship with her estranged daughter as healthy for her and not a failure        Goal 2) Learn and implement coping skills that result in a reduction of anxiety and worry, and improved daily functioning.  5 Point Likert rating baseline date: 05/28/2021  Target Date Goal Was reviewed Status Code Progress towards goal/Likert rating  05/29/2023 05/28/2022          O 2/5 - learning to use her skills more consistently  05/25/2024 05/26/2023          O 3.5 - pt is learning to use her skills and better manage her unrealistic and perfectionist tendencies that keep her stressed and anxious        Goal 3) Combine skills learned in therapy into a new daily approach to managing ADHD.  5 Point Likert rating baseline date: 05/28/2021  Target Date Goal Was reviewed Status Code Progress towards goal/Likert rating  05/29/2023 05/28/2022          O 2/5 - learning to use her skills more consistently  05/25/2024 05/26/2023          O 3.75 - accepted the need and began medication, has been more aware,  recognize, acknowledge and accept the issues related ADHD and is better able to apply strategies. Pt is kinder to herself around her "limitations" or need for an alternative way of approaching tasks        This plan has been reviewed and created by the following participants:  This plan will be reviewed at least every 12 months. Date Behavioral Olivia Clinician Date Guardian/Patient   05/28/2022 [name], Ph.D.   05/28/2022 Olivia [name]  05/26/2023 [name], Ph.D.  05/26/2023 Olivia [name]              Diagnosis: Major Depressive Disorder, recurrent, mild Generalized Anxiety Disorder Attention Deficit Disorder, predominantly inattentive   Olivia [name] reports that she had a tough time with the election results.  However, she felt like she managed better this time because she had skills to self regulate.  We d/e/p    [name], PhD  Son: Olivia [name] (17) he is on the ASD Olivia [name]  Olivia [name]

## 2023-09-30 ENCOUNTER — Other Ambulatory Visit: Payer: Self-pay

## 2023-10-06 ENCOUNTER — Ambulatory Visit (INDEPENDENT_AMBULATORY_CARE_PROVIDER_SITE_OTHER): Payer: 59 | Admitting: Psychology

## 2023-10-06 DIAGNOSIS — F330 Major depressive disorder, recurrent, mild: Secondary | ICD-10-CM | POA: Diagnosis not present

## 2023-10-06 DIAGNOSIS — F900 Attention-deficit hyperactivity disorder, predominantly inattentive type: Secondary | ICD-10-CM | POA: Diagnosis not present

## 2023-10-06 DIAGNOSIS — F411 Generalized anxiety disorder: Secondary | ICD-10-CM

## 2023-10-06 NOTE — Progress Notes (Signed)
PROGRESS NOTE:    Name: SARIE BYNOE Date: 10/06/2023 MRN: [id number] DOB: 1966/01/06 PCP: Willow Ora, MD  Time spent: 4:00 PM - 4:58 PM  Annual Review: 05/25/2024  Today I met with  [name] in remote video (Caregility) face-to-face individual psychotherapy.  Distance Site: [name]: Dr Odette Horns Remote Office Consent: Obtained verbal consent to transmit session remotely.   Patient is aware of the limitations inherent in participating in virtual therapy.   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 She is normally a high functioning and driven individual and it is a struggle to get motivated to do anything. If it is necessary for work she does it and does a good job, but it is not the same. She has gained 70 pounds in less than 9 months and it has impacted her self image. She is also peri menopausal and these hormonal changes are also likely to be contributing to her changing mood states.   A year and a half ago her father had quadrupel by-pass surgery and he was recently diagnosed with cancer. 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  Evangaline states that she was diagnosed with adult ADHD and responded well to medications.  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)   Son: Health visitor (15) he is on the ASD, he is a Holiday representative and attends [employer]   Mental Status Exam: Appearance:   Casual     Behavior:  Appropriate  Motor:  Normal  Speech/Language:   NA  Affect:  Appropriate  Mood:  normal  Thought process:  normal  Thought content:    WNL  Sensory/Perceptual disturbances:    WNL  Orientation:  oriented to person, place, time/date, situation, and day of week  Attention:  Good  Concentration:  Good  Memory:  WNL  Fund of knowledge:   Good  Insight:    Good  Judgment:   Good   Impulse Control:  Good    Individualized Treatment Plan                Strengths: bright, verbal, motivated, self aware, self advocate   Supports: spouse, family, friends, colleagues   Goal/Needs for Treatment:  In order of importance to patient  1) Develop healthy interpersonal relationships that lead to the alleviation and help prevent the relapse of depression.  2) Learn and implement coping skills that result in a reduction of anxiety and worry, and improved functioning.   3) Combine skills learned in therapy into a new daily approach to managing ADHD.   Client Statement of Needs: requests assistance with piecing back together her life as it once was before she got depressed (burnt out).   Treatment Level: Weekly Individual Outpatient Psychotherapy.  Symptoms: autonomic hyperactivity (e.g., palpitations, shortness of breath, dry mouth, trouble swallowing, nausea, diarrhea).  Childhood history of Attention Deficit Disorder (ADD) that was either diagnosed or later concluded due to the symptoms of behavioral problems at school, impulsivity, temper outbursts, and lack of concentration.  Depressed or irritable mood. Diminished interest in or enjoyment of activities. Easily distracted and drawn from task at hand.  Excessive and/or unrealistic worry that is difficult to control occurring more days than not for at least 6 months about a number of events or activities.  Feelings of hopelessness, worthlessness, or inappropriate guilt.  Lack  of energy.   Poor concentration and indecisiveness.  Restless and fidgety; unable to be sedentary for more than a short time.   Client Treatment Preferences: Continue with current therapist   Healthcare consumer's goal for treatment:  Psychologist, [name], Ph.D. will support the patient's ability to achieve the goals identified. Cognitive Behavioral Therapy, Dialectical Behavioral Therapy, Motivational Interviewing, SPACE, parent training,  and other evidenced-based practices will be used to promote progress towards healthy functioning.   Healthcare consumer Dannita "[name]" Pond will: Actively participate in therapy, working towards healthy functioning.    *Justification for Continuation/Discontinuation of Goal: R=Revised, O=Ongoing, A=Achieved, D=Discontinued  Goal 1) Develop healthy interpersonal relationships that lead to the alleviation and help prevent the relapse of depression.  5 Point Likert rating baseline date: 05/28/2021 Target Date Goal Was reviewed Status Code Progress towards goal/Likert rating  05/29/2023 05/28/2022         O 3/5 - learning to set better limits and boundaries, still hasn't been able to regulate her daily routines  05/25/2024 05/26/2023         O        3.75 - pt reestablished friendship but has not be consistent in reaching out, has improved her relationship with her parents with appropriate limits.  Is more accepting of her relationship with her estranged daughter as healthy for her and not a failure        Goal 2) Learn and implement coping skills that result in a reduction of anxiety and worry, and improved daily functioning.  5 Point Likert rating baseline date: 05/28/2021  Target Date Goal Was reviewed Status Code Progress towards goal/Likert rating  05/29/2023 05/28/2022          O 2/5 - learning to use her skills more consistently  05/25/2024 05/26/2023          O 3.5 - pt is learning to use her skills and better manage her unrealistic and perfectionist tendencies that keep her stressed and anxious        Goal 3) Combine skills learned in therapy into a new daily approach to managing ADHD.  5 Point Likert rating baseline date: 05/28/2021  Target Date Goal Was reviewed Status Code Progress towards goal/Likert rating  05/29/2023 05/28/2022          O 2/5 - learning to use her skills more consistently  05/25/2024 05/26/2023          O 3.75 - accepted the need and began medication, has been more aware,  recognize, acknowledge and accept the issues related ADHD and is better able to apply strategies. Pt is kinder to herself around her "limitations" or need for an alternative way of approaching tasks        This plan has been reviewed and created by the following participants:  This plan will be reviewed at least every 12 months. Date Behavioral Health Clinician Date Guardian/Patient   05/28/2022 [name], Ph.D.   05/28/2022 [name]  05/26/2023 [name], Ph.D.  05/26/2023 Santina Evans "[name]" Andrey Farmer              Diagnosis: Major Depressive Disorder, recurrent, mild Generalized Anxiety Disorder Attention Deficit Disorder, predominantly inattentive   [name] reports that she is very worried about her mother who is declining rapidly.  We d/e/p her anxieties about her mother, caring for someone who is resistant to help and p/s how to get her the care she needs.  I encouraged her to speak with her father,  ask clarifying questions and know that they have each other in this and that she doesn't have to do this all by herself.   [name], PhD  Son: Shanon Ace (17) he is on the ASD Friend - Amy  Super Ego - Kathie Rhodes

## 2023-10-07 ENCOUNTER — Other Ambulatory Visit: Payer: Self-pay

## 2023-10-07 DIAGNOSIS — L409 Psoriasis, unspecified: Secondary | ICD-10-CM | POA: Diagnosis not present

## 2023-10-07 DIAGNOSIS — L719 Rosacea, unspecified: Secondary | ICD-10-CM | POA: Diagnosis not present

## 2023-10-07 DIAGNOSIS — Z79899 Other long term (current) drug therapy: Secondary | ICD-10-CM | POA: Diagnosis not present

## 2023-10-07 MED ORDER — TALTZ 80 MG/ML ~~LOC~~ SOAJ: SUBCUTANEOUS | 5 refills | Status: DC

## 2023-10-08 ENCOUNTER — Telehealth (INDEPENDENT_AMBULATORY_CARE_PROVIDER_SITE_OTHER): Payer: 59 | Admitting: Family Medicine

## 2023-10-08 VITALS — Ht 69.0 in

## 2023-10-08 DIAGNOSIS — R0789 Other chest pain: Secondary | ICD-10-CM | POA: Diagnosis not present

## 2023-10-08 DIAGNOSIS — F339 Major depressive disorder, recurrent, unspecified: Secondary | ICD-10-CM

## 2023-10-08 DIAGNOSIS — Z8489 Family history of other specified conditions: Secondary | ICD-10-CM

## 2023-10-08 DIAGNOSIS — E890 Postprocedural hypothyroidism: Secondary | ICD-10-CM | POA: Diagnosis not present

## 2023-10-08 MED ORDER — LEVOTHYROXINE SODIUM 50 MCG PO TABS: 100.0000 ug | ORAL_TABLET | Freq: Every day | ORAL | Status: DC

## 2023-10-08 NOTE — Patient Instructions (Signed)
VISIT SUMMARY:  During today's visit, we discussed your intermittent chest pain, feelings of decreased energy, concerns about your mother's health, and ongoing management of your thyroid condition. We reviewed your current medications and counseling progress, and we have outlined a plan to address each of these issues.  YOUR PLAN:  -CHEST PAIN: Your chest pain, which is sharp and radiates down your arm, may be due to an esophageal spasm, costochondritis, or a potential cardiac issue. Since the pain improved with nitroglycerin, it suggests a possible esophageal spasm. We will monitor your symptoms and consider a referral to a gastrointestinal specialist if they persist.  -GENERALIZED ANXIETY DISORDER: You are feeling overwhelmed and experiencing a constant internal dialogue of blame. You are currently taking Wellbutrin 150 mg daily and finding counseling beneficial. We discussed the possibility of increasing your medication dosage but decided to wait until January for further evaluation. It's important to understand the reasons for your low mood and energy before making any changes.  -HYPOTHYROIDISM: Hypothyroidism means your thyroid gland is underactive and not producing enough hormones. You are currently taking levothyroxine 100 mcg and liothyronine 50 mcg daily. We will order thyroid function tests soon and adjust your medication based on the results.  -DEMENTIA (MOTHER): Your mother is showing signs of significant memory loss and confusion, which may indicate Alzheimer's disease. She is also having difficulty managing her diabetes and is not taking her medications. We discussed the importance of family involvement in her care and strategies to manage her behavior and safety. A follow-up visit is scheduled for January 28th for cognitive and hearing screening.  -GENERAL HEALTH MAINTENANCE: You are managing multiple health conditions and are actively involved in your healthcare. It's important to  continue your regular counseling sessions and maintain your current medication regimen. Regular follow-ups with your primary care provider are also essential.  INSTRUCTIONS:  Please follow up in January for a reassessment of your Wellbutrin dosage. Ensure your mother attends her January 28th appointment for cognitive and hearing screening. Monitor your thyroid function with the upcoming lab work.

## 2023-10-08 NOTE — Progress Notes (Signed)
Virtual Visit via Video Note  Subjective  CC:  Chief Complaint  Patient presents with   Medication Refill   Depression     I connected with Olivia [name] on 10/08/23 at  8:30 AM EST by a video enabled telemedicine application and verified that I am speaking with the correct person using two identifiers. Location patient: Home Location provider: [facility] [address] Office Persons participating in the virtual visit: Olivia [name], Willow Ora, MD Trudie Reed CMA  I discussed the limitations of evaluation and management by telemedicine and the availability of in person appointments. The patient expressed understanding and agreed to proceed. HPI: Olivia [name] is a 57 y.o. female who was contacted today to address the problems listed above in the chief complaint. Atypical chest pain: Reviewed notes from October 30.  Describes acute episode of left chest pain that radiated to left arm like nerve pain.  Also associate with nausea and diaphoresis.  Emergency room evaluation was unremarkable and not thought to be cardiac related.  Patient did have a cardiac evaluation in 2019 for palpitations which included a normal exercise stress test except for hypertensive response, normal echocardiogram and a Holter monitor showing some bigeminy.  Since, she has had a few more episodes lasting several minutes.  She thinks it could be related to esophageal spasm.  She does not have chest wall tenderness.  She took her husband's nitroglycerin which did resolve the pain.  She does have osteoarthritis of the spine and some scoliosis with occasional neck pain.  Possibly could be related to nerve problems in the neck as well.  No exertional symptoms.  She does have an elevated calcium coronary score and is on a statin. Major depression and anxiety: Continue to see a therapist weekly.  This is going very well for her and has been helpful.  She does want to discuss adjusting  her Wellbutrin.  It is helpful.  She feels at times though she would like to have more energy to complete more tasks.  She denies panic attacks or symptoms of depression. Hypothyroidism: Currently on 100 mcg levothyroxine daily and Cytomel 25 mcg daily.  Clinically well.  Has follow-up with endocrinology for lab work and medication adjustment if needed. Also would like to discuss her mother's health: She is concerned that her mother has memory problems and is developing dementia.  Very forgetful.  Noncompliant with medications.  She also is quite private with her medical conditions.  Patient's father, also reports memory concerns.  Also they are concerned that she is losing her hearing.  Patient does not accept these problems.  Assessment  1. Atypical chest pain   2. Major depression, recurrent, chronic (HCC)   3. Post-surgical hypothyroidism   4. Family health problem      Plan   Assessment & Plan Atypical Chest Pain, suspect esophageal spasm Intermittent chest pain lasting a couple of minutes, not related to exertion or eating. Previous ER visit in October for severe chest pain with diaphoresis and radiating arm pain. Differential diagnosis includes esophageal spasm, costochondritis, and potential cardiac etiology. Pain improved with nitroglycerin, suggesting possible esophageal spasm. Discussed potential GI referral if symptoms persist. - Monitor symptoms - Consider GI referral if symptoms persist  Generalized Anxiety Disorder Reports feeling overwhelmed and having a constant internal dialogue of blame. Currently on Wellbutrin 150 mg daily. Counseling is ongoing and beneficial. Discussed potential dosage increase but decided to wait until January for further  evaluation. Emphasized evaluating reasons for low mood and energy before adjusting medication. - Continue Wellbutrin 150 mg daily, she is doing quite well overall.  Can consider increasing medication if anxiety or feelings overwhelmed  become more prominent.  Patient agrees - Discuss symptoms and management with counselor  Hypothyroidism Currently on levothyroxine 100 mcg daily and liothyronine 50 mcg daily. Due for lab work in a week or two to assess thyroid function. Adjustments to medication will be based on lab results. -Follow-up on thyroid function tests - Review lab results and adjust medication as needed  Dementia (Mother) Mother exhibits significant memory loss and confusion, suspected Alzheimer's disease. Difficulty managing diabetes and refusal to take medications. Behavioral and safety concerns noted. Discussed challenges of managing her condition and importance of family involvement in her care. - Schedule follow-up visit for January 28th with cognitive and hearing screen - Encourage family member to accompany mother to appointments - Discuss behavioral and safety strategies with family  General Health Maintenance Managing multiple health conditions and actively involved in their own and their family's healthcare. Emphasized importance of regular counseling sessions and maintaining current medication regimen. - Continue regular counseling sessions - Maintain current medication regimen - Follow up with primary care as needed  Follow-up - Follow up in January for reassessment of Wellbutrin dosage - Ensure mother attends January 28th appointment with cognitive and hearing screen - Monitor thyroid function with upcoming lab work. I spent a total of 48 minutes for this patient encounter. Time spent included preparation, face-to-face counseling with the patient and coordination of care, review of chart and records, and documentation of the encounter.  I discussed the assessment and treatment plan with the patient. The patient was provided an opportunity to ask questions and all were answered. The patient agreed with the plan and demonstrated an understanding of the instructions.   The patient was advised to call  back or seek an in-person evaluation if the symptoms worsen or if the condition fails to improve as anticipated.   Meds ordered this encounter  Medications   levothyroxine (SYNTHROID) 50 MCG tablet    Sig: Take 2 tablets (100 mcg total) by mouth daily.      I reviewed the patients updated PMH, FH, and SocHx.    Patient Active Problem List   Diagnosis Date Noted   History of total thyroidectomy 01/17/2021    Priority: High   Post-surgical hypothyroidism 01/17/2021    Priority: High   Major depression, recurrent, chronic (HCC) 11/17/2018    Priority: High   History of laparoscopic adjustable gastric banding 05/08/2012    Priority: High   Mixed hyperlipidemia 04/15/2008    Priority: High   Morbid obesity (HCC) 04/15/2008    Priority: High   History of parathyroid surgery 2021 01/17/2021    Priority: Medium    History of hyperparathyroidism 05/22/2020    Priority: Medium    Ventricular bigeminy 11/17/2018    Priority: Medium    Atopic dermatitis 08/19/2018    Priority: Medium    Elevated coronary artery calcium score, 90th percentile for age and gender 08/16/2018    Priority: Medium    Psoriasis 01/05/2018    Priority: Medium    Chronic low back pain with sciatica 01/05/2018    Priority: Medium    IUD (intrauterine device) in place, Liletta 12/07/2017    Priority: Medium    PCOS (polycystic ovarian syndrome) 10/25/2014    Priority: Medium    Migraine with aura 01/11/2014    Priority: Medium  Vitamin D deficiency 01/05/2018    Priority: Low   Allergic rhinitis 04/15/2008    Priority: Low   Allergic asthma 04/15/2008    Priority: Low   Current Meds  Medication Sig   albuterol (PROVENTIL HFA) 108 (90 Base) MCG/ACT inhaler Inhale 2 puffs into the lungs every 6 (six) hours as needed for wheezing or shortness of breath.   albuterol (PROVENTIL) (2.5 MG/3ML) 0.083% nebulizer solution Take 3 mLs (2.5 mg total) by nebulization every 6 (six) hours as needed for wheezing or  shortness of breath.   ALPRAZolam (XANAX) 0.5 MG tablet Take 1 tablet (0.5 mg total) by mouth 2 (two) times daily as needed for anxiety.   buPROPion (WELLBUTRIN XL) 150 MG 24 hr tablet Take 1 tablet (150 mg total) by mouth daily.   cetirizine (ZYRTEC) 10 MG tablet Take 10 mg by mouth daily as needed for allergies.    clobetasol cream (TEMOVATE) 0.05 % Apply 1 application Externally Twice a day 14 days   clobetasol ointment (TEMOVATE) 0.05 % Apply 1 application Externally Twice a day 14 days   escitalopram (LEXAPRO) 20 MG tablet Take 1 tablet (20 mg total) by mouth daily.   famotidine (PEPCID) 20 MG tablet Take 20 mg by mouth daily as needed for heartburn or indigestion.   ixekizumab (TALTZ) 80 MG/ML pen Inject as directed subcutaneous once every 4 weeks 30 days   liothyronine (CYTOMEL) 25 MCG tablet Take 1.5 tablets (37.5 mcg total) by mouth daily.   metoprolol tartrate (LOPRESSOR) 25 MG tablet Take 12.5 mg by mouth daily as needed.   metroNIDAZOLE (METROCREAM) 0.75 % cream Apply 1 Application topically 2 (two) times daily.   Multiple Vitamin (MULITIVITAMIN WITH MINERALS) TABS Take 1 tablet by mouth daily.   naproxen (NAPROSYN) 500 MG tablet Take 1 tablet (500 mg total) by mouth 2 (two) times daily with a meal.   progesterone (PROMETRIUM) 100 MG capsule Take 1 capsule (100 mg total) by mouth at bedtime.   rosuvastatin (CRESTOR) 10 MG tablet Take 1 tablet (10 mg total) by mouth at bedtime.   triamcinolone cream (KENALOG) 0.1 % Apply as directed to affected area twice a day for 10-14 days as needed for flares   Vitamin D, Cholecalciferol, 50 MCG (2000 UT) CAPS Take 2,000 Units by mouth daily.   [DISCONTINUED] ixekizumab (TALTZ) 80 MG/ML pen 1 injection Subcutaneous Once every 4 weeks 30 days   [DISCONTINUED] levothyroxine (SYNTHROID) 112 MCG tablet Take 1 tablet (112 mcg total) by mouth daily.   [DISCONTINUED] levothyroxine (SYNTHROID) 200 MCG tablet Take 1 tablet (200 mcg total) by mouth daily.    [DISCONTINUED] levothyroxine (SYNTHROID) 50 MCG tablet Take 1 tablet (50 mcg total) by mouth daily in the morning.   [DISCONTINUED] liothyronine (CYTOMEL) 25 MCG tablet Take 1 tablet by mouth once daily.   [DISCONTINUED] liothyronine (CYTOMEL) 25 MCG tablet Take 1.5 tablets (37.5 mcg total) by mouth daily.    Allergies: Patient is allergic to cephalosporins, egg-derived products, penicillins, sulfa drugs cross reactors, adhesive [tape], and effexor [venlafaxine]. Family History: Patient family history includes Colon cancer in her mother; Colon polyps in her mother; Diabetes in her mother; Hypertension in her father; Obesity in her father, mother, and sister. Social History:  Patient  reports that she has never smoked. She has never used smokeless tobacco. She reports that she does not drink alcohol and does not use drugs.  Review of Systems: Constitutional: Negative for fever malaise or anorexia Cardiovascular: negative for chest pain Respiratory: negative for SOB  or persistent cough Gastrointestinal: negative for abdominal pain  OBJECTIVE Vitals: Ht 5\' 9"  (1.753 m)   BMI 48.73 kg/m  General: no acute distress , A&Ox3  Willow Ora, MD

## 2023-10-13 ENCOUNTER — Ambulatory Visit: Payer: 59 | Admitting: Psychology

## 2023-10-13 NOTE — Progress Notes (Unsigned)
   Olivia Favors, PhD

## 2023-10-15 ENCOUNTER — Other Ambulatory Visit: Payer: Self-pay

## 2023-10-15 ENCOUNTER — Other Ambulatory Visit (HOSPITAL_BASED_OUTPATIENT_CLINIC_OR_DEPARTMENT_OTHER): Payer: Self-pay

## 2023-10-15 ENCOUNTER — Other Ambulatory Visit: Payer: Self-pay | Admitting: Family Medicine

## 2023-10-15 ENCOUNTER — Ambulatory Visit (INDEPENDENT_AMBULATORY_CARE_PROVIDER_SITE_OTHER): Payer: 59 | Admitting: Psychology

## 2023-10-15 DIAGNOSIS — F330 Major depressive disorder, recurrent, mild: Secondary | ICD-10-CM

## 2023-10-15 DIAGNOSIS — F411 Generalized anxiety disorder: Secondary | ICD-10-CM | POA: Diagnosis not present

## 2023-10-15 DIAGNOSIS — F900 Attention-deficit hyperactivity disorder, predominantly inattentive type: Secondary | ICD-10-CM

## 2023-10-15 MED ORDER — LEVOTHYROXINE SODIUM 50 MCG PO TABS: 100.0000 ug | ORAL_TABLET | Freq: Every day | ORAL | Status: DC

## 2023-10-15 NOTE — Progress Notes (Signed)
PROGRESS NOTE:    Name: Olivia [name] Date: 10/15/2023 MRN: [id number] DOB: 1966/06/20 PCP: Willow Ora, MD  Time spent: 4:00 PM - 4:58 PM  Annual Review: 05/25/2024  Today I met with  Olivia [name] in remote video (Caregility) face-to-face individual psychotherapy.  Distance Site: [name]: Dr Odette Horns Remote Office Consent: Obtained verbal consent to transmit session remotely.   Patient is aware of the limitations inherent in participating in virtual therapy.   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 She is normally a high functioning and driven individual and it is a struggle to get motivated to do anything. If it is necessary for work she does it and does a good job, but it is not the same. She has gained 70 pounds in less than 9 months and it has impacted her self image. She is also peri menopausal and these hormonal changes are also likely to be contributing to her changing mood states.   A year and a half ago her father had quadrupel by-pass surgery and he was recently diagnosed with cancer. 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  Olivia [name] states that she was diagnosed with adult ADHD and responded well to medications.  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)   [name] he is on the ASD, he is a Holiday representative and attends [employer]   Mental Status Exam: Appearance:   Casual     Behavior:  Appropriate  Motor:  Normal  Speech/Language:   NA  Affect:  Appropriate  Mood:  normal  Thought process:  normal  Thought content:    WNL  Sensory/Perceptual disturbances:    WNL  Orientation:  oriented to person, place, time/date, situation, and day of week  Attention:  Good  Concentration:  Good  Memory:  WNL  Fund of knowledge:   Good  Insight:    Good  Judgment:   Good   Impulse Control:  Good    Individualized Treatment Plan                Strengths: bright, verbal, motivated, self aware, self advocate   Supports: spouse, family, friends, colleagues   Goal/Needs for Treatment:  In order of importance to patient  1) Develop healthy interpersonal relationships that lead to the alleviation and help prevent the relapse of depression.  2) Learn and implement coping skills that result in a reduction of anxiety and worry, and improved functioning.   3) Combine skills learned in therapy into a new daily approach to managing ADHD.   Client Statement of Needs: requests assistance with piecing back together her life as it once was before she got depressed (burnt out).   Treatment Level: Weekly Individual Outpatient Psychotherapy.  Symptoms: autonomic hyperactivity (e.g., palpitations, shortness of breath, dry mouth, trouble swallowing, nausea, diarrhea).  Childhood history of Attention Deficit Disorder (ADD) that was either diagnosed or later concluded due to the symptoms of behavioral problems at school, impulsivity, temper outbursts, and lack of concentration.  Depressed or irritable mood. Diminished interest in or enjoyment of activities. Easily distracted and drawn from task at hand.  Excessive and/or unrealistic worry that is difficult to control occurring more days than not for at least 6 months about a number of events or activities.  Feelings of hopelessness, worthlessness, or inappropriate guilt.  Lack  of energy.   Poor concentration and indecisiveness.  Restless and fidgety; unable to be sedentary for more than a short time.   Client Treatment Preferences: Continue with current therapist   Healthcare consumer's goal for treatment:  Psychologist, [name], Ph.D. will support the patient's ability to achieve the goals identified. Cognitive Behavioral Therapy, Dialectical Behavioral Therapy, Motivational Interviewing, SPACE, parent training,  and other evidenced-based practices will be used to promote progress towards healthy functioning.   Healthcare consumer Olivia [name] will: Actively participate in therapy, working towards healthy functioning.    *Justification for Continuation/Discontinuation of Goal: R=Revised, O=Ongoing, A=Achieved, D=Discontinued  Goal 1) Develop healthy interpersonal relationships that lead to the alleviation and help prevent the relapse of depression.  5 Point Likert rating baseline date: 05/28/2021 Target Date Goal Was reviewed Status Code Progress towards goal/Likert rating  05/29/2023 05/28/2022         O 3/5 - learning to set better limits and boundaries, still hasn't been able to regulate her daily routines  05/25/2024 05/26/2023         O        3.75 - pt reestablished friendship but has not be consistent in reaching out, has improved her relationship with her parents with appropriate limits.  Is more accepting of her relationship with her estranged daughter as healthy for her and not a failure        Goal 2) Learn and implement coping skills that result in a reduction of anxiety and worry, and improved daily functioning.  5 Point Likert rating baseline date: 05/28/2021  Target Date Goal Was reviewed Status Code Progress towards goal/Likert rating  05/29/2023 05/28/2022          O 2/5 - learning to use her skills more consistently  05/25/2024 05/26/2023          O 3.5 - pt is learning to use her skills and better manage her unrealistic and perfectionist tendencies that keep her stressed and anxious        Goal 3) Combine skills learned in therapy into a new daily approach to managing ADHD.  5 Point Likert rating baseline date: 05/28/2021  Target Date Goal Was reviewed Status Code Progress towards goal/Likert rating  05/29/2023 05/28/2022          O 2/5 - learning to use her skills more consistently  05/25/2024 05/26/2023          O 3.75 - accepted the need and began medication, has been more aware,  recognize, acknowledge and accept the issues related ADHD and is better able to apply strategies. Pt is kinder to herself around her "limitations" or need for an alternative way of approaching tasks        This plan has been reviewed and created by the following participants:  This plan will be reviewed at least every 12 months. Date Behavioral Olivia Clinician Date Guardian/Patient   05/28/2022 [name], Ph.D.   05/28/2022 Olivia [name]  05/26/2023 [name], Ph.D.  05/26/2023 Olivia [name]              Diagnosis: Major Depressive Disorder, recurrent, mild Generalized Anxiety Disorder Attention Deficit Disorder, predominantly inattentive   Olivia [name] reports that her family is celebrating Christmas on Sunday so that her niece could be included.  We d/e/p the family drama that her sister stirred up, how to manage the coming event and enlisting her father's help.  It was very helpful for Olivia [name] to have her  experiences validated.   [name], PhD  Son: Shanon Ace (17) he is on the ASD Friend - Amy  [employer] - Kathie Rhodes

## 2023-10-17 ENCOUNTER — Other Ambulatory Visit: Payer: Self-pay | Admitting: Family Medicine

## 2023-10-17 ENCOUNTER — Other Ambulatory Visit: Payer: Self-pay

## 2023-10-17 ENCOUNTER — Other Ambulatory Visit (HOSPITAL_BASED_OUTPATIENT_CLINIC_OR_DEPARTMENT_OTHER): Payer: Self-pay

## 2023-10-17 ENCOUNTER — Other Ambulatory Visit (HOSPITAL_COMMUNITY): Payer: Self-pay

## 2023-10-17 MED ORDER — LEVOTHYROXINE SODIUM 100 MCG PO TABS
100.0000 ug | ORAL_TABLET | Freq: Every day | ORAL | 3 refills | Status: DC
  Filled 2023-10-17: qty 90, 90d supply, fill #0
  Filled 2024-01-19 (×2): qty 90, 90d supply, fill #1
  Filled 2024-04-14: qty 90, 90d supply, fill #2

## 2023-10-20 ENCOUNTER — Ambulatory Visit: Payer: 59 | Admitting: Psychology

## 2023-10-20 DIAGNOSIS — Z1331 Encounter for screening for depression: Secondary | ICD-10-CM | POA: Diagnosis not present

## 2023-10-20 DIAGNOSIS — Z01419 Encounter for gynecological examination (general) (routine) without abnormal findings: Secondary | ICD-10-CM | POA: Diagnosis not present

## 2023-10-20 DIAGNOSIS — Z1231 Encounter for screening mammogram for malignant neoplasm of breast: Secondary | ICD-10-CM | POA: Diagnosis not present

## 2023-10-20 LAB — HM MAMMOGRAPHY

## 2023-10-21 ENCOUNTER — Other Ambulatory Visit: Payer: Self-pay

## 2023-10-21 ENCOUNTER — Ambulatory Visit (INDEPENDENT_AMBULATORY_CARE_PROVIDER_SITE_OTHER): Payer: 59 | Admitting: Psychology

## 2023-10-21 DIAGNOSIS — F411 Generalized anxiety disorder: Secondary | ICD-10-CM | POA: Diagnosis not present

## 2023-10-21 DIAGNOSIS — F330 Major depressive disorder, recurrent, mild: Secondary | ICD-10-CM | POA: Diagnosis not present

## 2023-10-21 DIAGNOSIS — F900 Attention-deficit hyperactivity disorder, predominantly inattentive type: Secondary | ICD-10-CM | POA: Diagnosis not present

## 2023-10-21 NOTE — Progress Notes (Signed)
Specialty Pharmacy Refill Coordination Note  Olivia [name] is a 57 y.o. female contacted today regarding refills of specialty medication(s) Ixekizumab Altamease Oiler)   Patient requested Delivery   Delivery date: 10/28/23   Verified address: [address] [place], [place], [address]   Medication will be filled on 12.23.24.

## 2023-10-21 NOTE — Progress Notes (Signed)
PROGRESS NOTE:    Name: DENEANE TETTER Date: 10/21/2023 MRN: [id number] DOB: 06/03/1966 PCP: Willow Ora, MD  Time spent: 4:00 PM - 4:59 PM  Annual Review: 05/25/2024  Today I met with  [name] in remote video (Caregility) face-to-face individual psychotherapy.  Distance Site: [name]: Dr Odette Horns Remote Office Consent: Obtained verbal consent to transmit session remotely.   Patient is aware of the limitations inherent in participating in virtual therapy.   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 She is normally a high functioning and driven individual and it is a struggle to get motivated to do anything. If it is necessary for work she does it and does a good job, but it is not the same. She has gained 70 pounds in less than 9 months and it has impacted her self image. She is also peri menopausal and these hormonal changes are also likely to be contributing to her changing mood states.   A year and a half ago her father had quadrupel by-pass surgery and he was recently diagnosed with cancer. 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  Millicent states that she was diagnosed with adult ADHD and responded well to medications.  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)   Son: Health visitor (15) he is on the ASD, he is a Holiday representative and attends [employer]   Mental Status Exam: Appearance:   Casual     Behavior:  Appropriate  Motor:  Normal  Speech/Language:   NA  Affect:  Appropriate  Mood:  normal  Thought process:  normal  Thought content:    WNL  Sensory/Perceptual disturbances:    WNL  Orientation:  oriented to person, place, time/date, situation, and day of week  Attention:  Good  Concentration:  Good  Memory:  WNL  Fund of knowledge:   Good  Insight:    Good  Judgment:   Good   Impulse Control:  Good    Individualized Treatment Plan                Strengths: bright, verbal, motivated, self aware, self advocate   Supports: spouse, family, friends, colleagues   Goal/Needs for Treatment:  In order of importance to patient  1) Develop healthy interpersonal relationships that lead to the alleviation and help prevent the relapse of depression.  2) Learn and implement coping skills that result in a reduction of anxiety and worry, and improved functioning.   3) Combine skills learned in therapy into a new daily approach to managing ADHD.   Client Statement of Needs: requests assistance with piecing back together her life as it once was before she got depressed (burnt out).   Treatment Level: Weekly Individual Outpatient Psychotherapy.  Symptoms: autonomic hyperactivity (e.g., palpitations, shortness of breath, dry mouth, trouble swallowing, nausea, diarrhea).  Childhood history of Attention Deficit Disorder (ADD) that was either diagnosed or later concluded due to the symptoms of behavioral problems at school, impulsivity, temper outbursts, and lack of concentration.  Depressed or irritable mood. Diminished interest in or enjoyment of activities. Easily distracted and drawn from task at hand.  Excessive and/or unrealistic worry that is difficult to control occurring more days than not for at least 6 months about a number of events or activities.  Feelings of hopelessness, worthlessness, or inappropriate guilt.  Lack of  energy.   Poor concentration and indecisiveness.  Restless and fidgety; unable to be sedentary for more than a short time.   Client Treatment Preferences: Continue with current therapist   Healthcare consumer's goal for treatment:  Psychologist, [name], Ph.D. will support the patient's ability to achieve the goals identified. Cognitive Behavioral Therapy, Dialectical Behavioral Therapy, Motivational Interviewing, SPACE, parent training,  and other evidenced-based practices will be used to promote progress towards healthy functioning.   Healthcare consumer Joandra "[name]" Gamby will: Actively participate in therapy, working towards healthy functioning.    *Justification for Continuation/Discontinuation of Goal: R=Revised, O=Ongoing, A=Achieved, D=Discontinued  Goal 1) Develop healthy interpersonal relationships that lead to the alleviation and help prevent the relapse of depression.  5 Point Likert rating baseline date: 05/28/2021 Target Date Goal Was reviewed Status Code Progress towards goal/Likert rating  05/29/2023 05/28/2022         O 3/5 - learning to set better limits and boundaries, still hasn't been able to regulate her daily routines  05/25/2024 05/26/2023         O        3.75 - pt reestablished friendship but has not be consistent in reaching out, has improved her relationship with her parents with appropriate limits.  Is more accepting of her relationship with her estranged daughter as healthy for her and not a failure        Goal 2) Learn and implement coping skills that result in a reduction of anxiety and worry, and improved daily functioning.  5 Point Likert rating baseline date: 05/28/2021  Target Date Goal Was reviewed Status Code Progress towards goal/Likert rating  05/29/2023 05/28/2022          O 2/5 - learning to use her skills more consistently  05/25/2024 05/26/2023          O 3.5 - pt is learning to use her skills and better manage her unrealistic and perfectionist tendencies that keep her stressed and anxious        Goal 3) Combine skills learned in therapy into a new daily approach to managing ADHD.  5 Point Likert rating baseline date: 05/28/2021  Target Date Goal Was reviewed Status Code Progress towards goal/Likert rating  05/29/2023 05/28/2022          O 2/5 - learning to use her skills more consistently  05/25/2024 05/26/2023          O 3.75 - accepted the need and began medication, has been more aware,  recognize, acknowledge and accept the issues related ADHD and is better able to apply strategies. Pt is kinder to herself around her "limitations" or need for an alternative way of approaching tasks        This plan has been reviewed and created by the following participants:  This plan will be reviewed at least every 12 months. Date Behavioral Health Clinician Date Guardian/Patient   05/28/2022 [name], Ph.D.   05/28/2022 [name]  05/26/2023 [name], Ph.D.  05/26/2023 Santina Evans "[name]" Andrey Farmer              Diagnosis: Major Depressive Disorder, recurrent, mild Generalized Anxiety Disorder Attention Deficit Disorder, predominantly inattentive   [name] reports that her family celebrated Christmas on Sunday.  We d/e/p that it went relatively well given her family, family dynamics, and keeping things from going off the rails.  It's the holiday season, her mother brought up the subject of her daughter.  We d//e/p how she felt,  how she managed her grief, and acknowledging that she continues to come to terms with her daughter's estrangement in waves.  [name] talked about her holiday plans and looking forward to having time to herself while her husband and son travel to [place].  We d/ my upcoming holiday break and confirmed our next appointment.   [name], PhD  Son: Shanon Ace (17) he is on the ASD Friend - Amy  Super Ego - Kathie Rhodes

## 2023-10-27 ENCOUNTER — Other Ambulatory Visit: Payer: Self-pay

## 2023-10-27 ENCOUNTER — Ambulatory Visit: Payer: 59 | Admitting: Psychology

## 2023-11-03 ENCOUNTER — Ambulatory Visit: Payer: 59 | Admitting: Psychology

## 2023-11-10 ENCOUNTER — Ambulatory Visit (INDEPENDENT_AMBULATORY_CARE_PROVIDER_SITE_OTHER): Payer: 59 | Admitting: Psychology

## 2023-11-10 DIAGNOSIS — F330 Major depressive disorder, recurrent, mild: Secondary | ICD-10-CM | POA: Diagnosis not present

## 2023-11-10 DIAGNOSIS — F411 Generalized anxiety disorder: Secondary | ICD-10-CM | POA: Diagnosis not present

## 2023-11-10 DIAGNOSIS — F900 Attention-deficit hyperactivity disorder, predominantly inattentive type: Secondary | ICD-10-CM

## 2023-11-10 NOTE — Progress Notes (Signed)
 PROGRESS NOTE:    Name: Olivia [name] Date: 11/10/2023 MRN: [id number] DOB: May 15, 1966 PCP: Jodie Lavern CROME, MD  Time spent: 4:00 PM - 4:58 PM  Annual Review: 05/25/2024  Today I met with  [name] in remote video (Caregility) face-to-face individual psychotherapy.  Distance Site: [name]: Dr Edison Remote Office Consent: Obtained verbal consent to transmit session remotely.   Patient is aware of the limitations inherent in participating in virtual therapy.   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 She is normally a high functioning and driven individual and it is a struggle to get motivated to do anything. If it is necessary for work she does it and does a good job, but it is not the same. She has gained 70 pounds in less than 9 months and it has impacted her self image. She is also peri menopausal and these hormonal changes are also likely to be contributing to her changing mood states.   A year and a half ago her father had quadrupel by-pass surgery and he was recently diagnosed with cancer. 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  Veida states that she was diagnosed with adult ADHD and responded well to medications.  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[employer])   Son: Health Visitor (15) he is on the ASD, he is a Holiday Representative and attends [employer]   Mental Status Exam: Appearance:   Casual     Behavior:  Appropriate  Motor:  Normal  Speech/Language:   NA  Affect:  Appropriate  Mood:  normal  Thought process:  normal  Thought content:    WNL  Sensory/Perceptual disturbances:    WNL  Orientation:  oriented to person, place, time/date, situation, and day of week  Attention:  Good  Concentration:  Good  Memory:  WNL  Fund of knowledge:   Good  Insight:    Good  Judgment:   Good  Impulse  Control:  Good    Individualized Treatment Plan                Strengths: bright, verbal, motivated, self aware, self advocate   Supports: spouse, family, friends, colleagues   Goal/Needs for Treatment:  In order of importance to patient  1) Develop healthy interpersonal relationships that lead to the alleviation and help prevent the relapse of depression.  2) Learn and implement coping skills that result in a reduction of anxiety and worry, and improved functioning.   3) Combine skills learned in therapy into a new daily approach to managing ADHD.   Client Statement of Needs: requests assistance with piecing back together her life as it once was before she got depressed (burnt out).   Treatment Level: Weekly Individual Outpatient Psychotherapy.  Symptoms: autonomic hyperactivity (e.g., palpitations, shortness of breath, dry mouth, trouble swallowing, nausea, diarrhea).  Childhood history of Attention Deficit Disorder (ADD) that was either diagnosed or later concluded due to the symptoms of behavioral problems at school, impulsivity, temper outbursts, and lack of concentration.  Depressed or irritable mood. Diminished interest in or enjoyment of activities. Easily distracted and drawn from task at hand.  Excessive and/or unrealistic worry that is difficult to control occurring more days than not for at least 6 months about a number of events or activities.  Feelings of hopelessness, worthlessness, or inappropriate guilt.  Lack of  energy.   Poor concentration and indecisiveness.  Restless and fidgety; unable to be sedentary for more than a short time.   Client Treatment Preferences: Continue with current therapist   Healthcare consumer's goal for treatment:  Psychologist, Ronal Jenkins Sprung, Ph.D. will support the patient's ability to achieve the goals identified. Cognitive Behavioral Therapy, Dialectical Behavioral Therapy, Motivational Interviewing, SPACE, parent training, and other  evidenced-based practices will be used to promote progress towards healthy functioning.   Healthcare consumer Adalea Handler will: Actively participate in therapy, working towards healthy functioning.    *Justification for Continuation/Discontinuation of Goal: R=Revised, O=Ongoing, A=Achieved, D=Discontinued  Goal 1) Develop healthy interpersonal relationships that lead to the alleviation and help prevent the relapse of depression.  5 Point Likert rating baseline date: 05/28/2021 Target Date Goal Was reviewed Status Code Progress towards goal/Likert rating  05/29/2023 05/28/2022         O 3/5 - learning to set better limits and boundaries, still hasn't been able to regulate her daily routines  05/25/2024 05/26/2023         O        3.75 - pt reestablished friendship but has not be consistent in reaching out, has improved her relationship with her parents with appropriate limits.  Is more accepting of her relationship with her estranged daughter as healthy for her and not a failure        Goal 2) Learn and implement coping skills that result in a reduction of anxiety and worry, and improved daily functioning.  5 Point Likert rating baseline date: 05/28/2021  Target Date Goal Was reviewed Status Code Progress towards goal/Likert rating  05/29/2023 05/28/2022          O 2/5 - learning to use her skills more consistently  05/25/2024 05/26/2023          O 3.5 - pt is learning to use her skills and better manage her unrealistic and perfectionist tendencies that keep her stressed and anxious        Goal 3) Combine skills learned in therapy into a new daily approach to managing ADHD.  5 Point Likert rating baseline date: 05/28/2021  Target Date Goal Was reviewed Status Code Progress towards goal/Likert rating  05/29/2023 05/28/2022          O 2/5 - learning to use her skills more consistently  05/25/2024 05/26/2023          O 3.75 - accepted the need and began medication, has been more aware, recognize,  acknowledge and accept the issues related ADHD and is better able to apply strategies. Pt is kinder to herself around her limitations or need for an alternative way of approaching tasks        This plan has been reviewed and created by the following participants:  This plan will be reviewed at least every 12 months. Date Behavioral Health Clinician Date Guardian/Patient   05/28/2022 Ronal Jenkins Sprung, Ph.D.   05/28/2022 Dorothyann [name] Ship  05/26/2023 Ronal Jenkins Sprung, Ph.D.  05/26/2023 Dorothyann [name] Ship              Diagnosis: Major Depressive Disorder, recurrent, mild Generalized Anxiety Disorder Attention Deficit Disorder, predominantly inattentive   [name] reports that the holidays went well.  We d/p that while her husband and so were away, she had a big ahah moment.  We d/ and p/s how to gain her family's cooperation in these matters.  We d/p another big issues that arose related to her daughter  and her now deceased ex-husband.  I provided the support and guidance she required related to this issue.   Ronal Jenkins Sprung, PhD  Son: Spurgeon (17) he is on the ASD Friend - Amy  Super Ego - Dickey

## 2023-11-17 ENCOUNTER — Ambulatory Visit: Payer: 59 | Admitting: Psychology

## 2023-11-17 DIAGNOSIS — F411 Generalized anxiety disorder: Secondary | ICD-10-CM

## 2023-11-17 DIAGNOSIS — F330 Major depressive disorder, recurrent, mild: Secondary | ICD-10-CM | POA: Diagnosis not present

## 2023-11-17 DIAGNOSIS — F900 Attention-deficit hyperactivity disorder, predominantly inattentive type: Secondary | ICD-10-CM

## 2023-11-17 NOTE — Progress Notes (Signed)
 PROGRESS NOTE:    Name: Olivia [name] Date: 11/17/2023 MRN: [id number] DOB: May 02, 1966 PCP: Jodie Lavern CROME, MD  Time spent: 4:00 PM - 4:59 PM  Annual Review: 05/25/2024  Today I met with  [name] in remote video (Caregility) face-to-face individual psychotherapy.  Distance Site: [name]: Dr Edison Remote Office Consent: Obtained verbal consent to transmit session remotely.   Patient is aware of the limitations inherent in participating in virtual therapy.   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 She is normally a high functioning and driven individual and it is a struggle to get motivated to do anything. If it is necessary for work she does it and does a good job, but it is not the same. She has gained 70 pounds in less than 9 months and it has impacted her self image. She is also peri menopausal and these hormonal changes are also likely to be contributing to her changing mood states.   A year and a half ago her father had quadrupel by-pass surgery and he was recently diagnosed with cancer. 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  Jeweliana states that she was diagnosed with adult ADHD and responded well to medications.  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[employer])   Son: Health Visitor (15) he is on the ASD, he is a Holiday Representative and attends [employer]   Mental Status Exam: Appearance:   Casual     Behavior:  Appropriate  Motor:  Normal  Speech/Language:   NA  Affect:  Appropriate  Mood:  normal  Thought process:  normal  Thought content:    WNL  Sensory/Perceptual disturbances:    WNL  Orientation:  oriented to person, place, time/date, situation, and day of week  Attention:  Good  Concentration:  Good  Memory:  WNL  Fund of knowledge:   Good  Insight:    Good  Judgment:   Good  Impulse  Control:  Good    Individualized Treatment Plan                Strengths: bright, verbal, motivated, self aware, self advocate   Supports: spouse, family, friends, colleagues   Goal/Needs for Treatment:  In order of importance to patient  1) Develop healthy interpersonal relationships that lead to the alleviation and help prevent the relapse of depression.  2) Learn and implement coping skills that result in a reduction of anxiety and worry, and improved functioning.   3) Combine skills learned in therapy into a new daily approach to managing ADHD.   Client Statement of Needs: requests assistance with piecing back together her life as it once was before she got depressed (burnt out).   Treatment Level: Weekly Individual Outpatient Psychotherapy.  Symptoms: autonomic hyperactivity (e.g., palpitations, shortness of breath, dry mouth, trouble swallowing, nausea, diarrhea).  Childhood history of Attention Deficit Disorder (ADD) that was either diagnosed or later concluded due to the symptoms of behavioral problems at school, impulsivity, temper outbursts, and lack of concentration.  Depressed or irritable mood. Diminished interest in or enjoyment of activities. Easily distracted and drawn from task at hand.  Excessive and/or unrealistic worry that is difficult to control occurring more days than not for at least 6 months about a number of events or activities.  Feelings of hopelessness, worthlessness, or inappropriate guilt.  Lack of  energy.   Poor concentration and indecisiveness.  Restless and fidgety; unable to be sedentary for more than a short time.   Client Treatment Preferences: Continue with current therapist   Healthcare consumer's goal for treatment:  Psychologist, Ronal Jenkins Sprung, Ph.D. will support the patient's ability to achieve the goals identified. Cognitive Behavioral Therapy, Dialectical Behavioral Therapy, Motivational Interviewing, SPACE, parent training, and other  evidenced-based practices will be used to promote progress towards healthy functioning.   Healthcare consumer Lailani Tool will: Actively participate in therapy, working towards healthy functioning.    *Justification for Continuation/Discontinuation of Goal: R=Revised, O=Ongoing, A=Achieved, D=Discontinued  Goal 1) Develop healthy interpersonal relationships that lead to the alleviation and help prevent the relapse of depression.  5 Point Likert rating baseline date: 05/28/2021 Target Date Goal Was reviewed Status Code Progress towards goal/Likert rating  05/29/2023 05/28/2022         O 3/5 - learning to set better limits and boundaries, still hasn't been able to regulate her daily routines  05/25/2024 05/26/2023         O        3.75 - pt reestablished friendship but has not be consistent in reaching out, has improved her relationship with her parents with appropriate limits.  Is more accepting of her relationship with her estranged daughter as healthy for her and not a failure        Goal 2) Learn and implement coping skills that result in a reduction of anxiety and worry, and improved daily functioning.  5 Point Likert rating baseline date: 05/28/2021  Target Date Goal Was reviewed Status Code Progress towards goal/Likert rating  05/29/2023 05/28/2022          O 2/5 - learning to use her skills more consistently  05/25/2024 05/26/2023          O 3.5 - pt is learning to use her skills and better manage her unrealistic and perfectionist tendencies that keep her stressed and anxious        Goal 3) Combine skills learned in therapy into a new daily approach to managing ADHD.  5 Point Likert rating baseline date: 05/28/2021  Target Date Goal Was reviewed Status Code Progress towards goal/Likert rating  05/29/2023 05/28/2022          O 2/5 - learning to use her skills more consistently  05/25/2024 05/26/2023          O 3.75 - accepted the need and began medication, has been more aware, recognize,  acknowledge and accept the issues related ADHD and is better able to apply strategies. Pt is kinder to herself around her limitations or need for an alternative way of approaching tasks        This plan has been reviewed and created by the following participants:  This plan will be reviewed at least every 12 months. Date Behavioral Health Clinician Date Guardian/Patient   05/28/2022 Ronal Jenkins Sprung, Ph.D.   05/28/2022 Dorothyann [name] Ship  05/26/2023 Ronal Jenkins Sprung, Ph.D.  05/26/2023 Dorothyann [name] Ship              Diagnosis: Major Depressive Disorder, recurrent, mild Generalized Anxiety Disorder Attention Deficit Disorder, predominantly inattentive   [name] reports that she focused on her sleep this past week, and did much better.  She also worked on her diet and exercise.  [name] shared that she was asked to speak/teach.  We d/e/p time management issues due to ADHD, and keeping a balance of commitments.  I  offered support, and practical guidance for how to decrease procrastination and feel more accomplished.  We set a few goals for the week which [facility] felt she could easily accomplish.   Ronal Jenkins Sprung, PhD  Son: Spurgeon (17) he is on the ASD Friend - Amy  Super Ego - Dickey

## 2023-11-18 ENCOUNTER — Other Ambulatory Visit (HOSPITAL_COMMUNITY): Payer: Self-pay

## 2023-11-18 NOTE — Progress Notes (Signed)
 Specialty Pharmacy Refill Coordination Note  Olivia [name] is a 58 y.o. female contacted today regarding refills of specialty medication(s) Ixekizumab  (Taltz )   Patient requested Delivery   Delivery date: 11/26/23   Verified address: [address] [place] [address]   Medication will be filled on 11/25/23.

## 2023-11-20 ENCOUNTER — Other Ambulatory Visit: Payer: Self-pay | Admitting: Family Medicine

## 2023-11-21 ENCOUNTER — Other Ambulatory Visit: Payer: Self-pay

## 2023-11-21 ENCOUNTER — Other Ambulatory Visit (HOSPITAL_BASED_OUTPATIENT_CLINIC_OR_DEPARTMENT_OTHER): Payer: Self-pay

## 2023-11-21 MED ORDER — BUPROPION HCL ER (XL) 150 MG PO TB24
150.0000 mg | ORAL_TABLET | Freq: Every day | ORAL | 3 refills | Status: DC
  Filled 2023-11-21: qty 90, 90d supply, fill #0
  Filled 2024-02-15: qty 90, 90d supply, fill #1
  Filled 2024-05-18: qty 90, 90d supply, fill #2
  Filled 2024-08-15: qty 90, 90d supply, fill #3

## 2023-11-24 ENCOUNTER — Other Ambulatory Visit (HOSPITAL_BASED_OUTPATIENT_CLINIC_OR_DEPARTMENT_OTHER): Payer: Self-pay

## 2023-11-24 ENCOUNTER — Ambulatory Visit: Payer: Commercial Managed Care - PPO | Admitting: Psychology

## 2023-11-24 DIAGNOSIS — F330 Major depressive disorder, recurrent, mild: Secondary | ICD-10-CM

## 2023-11-24 DIAGNOSIS — F411 Generalized anxiety disorder: Secondary | ICD-10-CM

## 2023-11-24 DIAGNOSIS — F900 Attention-deficit hyperactivity disorder, predominantly inattentive type: Secondary | ICD-10-CM

## 2023-11-24 NOTE — Progress Notes (Signed)
PROGRESS NOTE:    Name: Olivia [name] Date: 11/24/2023 MRN: [id number] DOB: 11-30-1965 PCP: Willow Ora, MD  Time spent: 4:00 PM - 4:59 PM  Annual Review: 05/25/2024  Today I met with  Olivia [name] in remote video (Caregility) face-to-face individual psychotherapy.  Distance Site: [name]: Dr Odette Horns Remote Office Consent: Obtained verbal consent to transmit session remotely.   Patient is aware of the limitations inherent in participating in virtual therapy.   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 She is normally a high functioning and driven individual and it is a struggle to get motivated to do anything. If it is necessary for work she does it and does a good job, but it is not the same. She has gained 70 pounds in less than 9 months and it has impacted her self image. She is also peri menopausal and these hormonal changes are also likely to be contributing to her changing mood states.   A year and a half ago her father had quadrupel by-pass surgery and he was recently diagnosed with cancer. 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  Olivia [name] states that she was diagnosed with adult ADHD and responded well to medications.  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)   [name] he is on the ASD, he is a Holiday representative and attends [employer]   Mental Status Exam: Appearance:   Casual     Behavior:  Appropriate  Motor:  Normal  Speech/Language:   NA  Affect:  Appropriate  Mood:  normal  Thought process:  normal  Thought content:    WNL  Sensory/Perceptual disturbances:    WNL  Orientation:  oriented to person, place, time/date, situation, and day of week  Attention:  Good  Concentration:  Good  Memory:  WNL  Fund of knowledge:   Good  Insight:    Good  Judgment:   Good  Impulse  Control:  Good    Individualized Treatment Plan                Strengths: bright, verbal, motivated, self aware, self advocate   Supports: spouse, family, friends, colleagues   Goal/Needs for Treatment:  In order of importance to patient  1) Develop healthy interpersonal relationships that lead to the alleviation and help prevent the relapse of depression.  2) Learn and implement coping skills that result in a reduction of anxiety and worry, and improved functioning.   3) Combine skills learned in therapy into a new daily approach to managing ADHD.   Client Statement of Needs: requests assistance with piecing back together her life as it once was before she got depressed (burnt out).   Treatment Level: Weekly Individual Outpatient Psychotherapy.  Symptoms: autonomic hyperactivity (e.g., palpitations, shortness of breath, dry mouth, trouble swallowing, nausea, diarrhea).  Childhood history of Attention Deficit Disorder (ADD) that was either diagnosed or later concluded due to the symptoms of behavioral problems at school, impulsivity, temper outbursts, and lack of concentration.  Depressed or irritable mood. Diminished interest in or enjoyment of activities. Easily distracted and drawn from task at hand.  Excessive and/or unrealistic worry that is difficult to control occurring more days than not for at least 6 months about a number of events or activities.  Feelings of hopelessness, worthlessness, or inappropriate guilt.  Lack of  energy.   Poor concentration and indecisiveness.  Restless and fidgety; unable to be sedentary for more than a short time.   Client Treatment Preferences: Continue with current therapist   Healthcare consumer's goal for treatment:  Psychologist, Olivia [name], Ph.D. will support the patient's ability to achieve the goals identified. Cognitive Behavioral Therapy, Dialectical Behavioral Therapy, Motivational Interviewing, SPACE, parent training, and other  evidenced-based practices will be used to promote progress towards healthy functioning.   Healthcare consumer Olivia [name] will: Actively participate in therapy, working towards healthy functioning.    *Justification for Continuation/Discontinuation of Goal: R=Revised, O=Ongoing, A=Achieved, D=Discontinued  Goal 1) Develop healthy interpersonal relationships that lead to the alleviation and help prevent the relapse of depression.  5 Point Likert rating baseline date: 05/28/2021 Target Date Goal Was reviewed Status Code Progress towards goal/Likert rating  05/29/2023 05/28/2022         O 3/5 - learning to set better limits and boundaries, still hasn't been able to regulate her daily routines  05/25/2024 05/26/2023         O        3.75 - pt reestablished friendship but has not be consistent in reaching out, has improved her relationship with her parents with appropriate limits.  Is more accepting of her relationship with her estranged daughter as healthy for her and not a failure        Goal 2) Learn and implement coping skills that result in a reduction of anxiety and worry, and improved daily functioning.  5 Point Likert rating baseline date: 05/28/2021  Target Date Goal Was reviewed Status Code Progress towards goal/Likert rating  05/29/2023 05/28/2022          O 2/5 - learning to use her skills more consistently  05/25/2024 05/26/2023          O 3.5 - pt is learning to use her skills and better manage her unrealistic and perfectionist tendencies that keep her stressed and anxious        Goal 3) Combine skills learned in therapy into a new daily approach to managing ADHD.  5 Point Likert rating baseline date: 05/28/2021  Target Date Goal Was reviewed Status Code Progress towards goal/Likert rating  05/29/2023 05/28/2022          O 2/5 - learning to use her skills more consistently  05/25/2024 05/26/2023          O 3.75 - accepted the need and began medication, has been more aware, recognize,  acknowledge and accept the issues related ADHD and is better able to apply strategies. Pt is kinder to herself around her "limitations" or need for an alternative way of approaching tasks        This plan has been reviewed and created by the following participants:  This plan will be reviewed at least every 12 months. Date Behavioral Olivia Clinician Date Guardian/Patient   05/28/2022 Olivia [name], Ph.D.   05/28/2022 Olivia [name]  05/26/2023 Olivia [name], Ph.D.  05/26/2023 Olivia [name]              Diagnosis: Major Depressive Disorder, recurrent, mild Generalized Anxiety Disorder Attention Deficit Disorder, predominantly inattentive   Olivia [name] reports that she has been working on self care.  She states that she found an app that has been very helpful.  We d/e/p her anxiety over recent financial expenses, how she used her CBT skills to manage, and how she used these same skills to talk her  husband's through his anxiety.  Lastly, we d/ how to keep perspective, and maintaining a sense of hopefulness.  I encouraged her to start thinking about training her "replacement" and to plan for the day she can be relieved from her consultation services.   Olivia Favors, PhD  Son: Olivia [name] (17) he is on the ASD Friend - Amy  Super Ego - Kathie Rhodes

## 2023-11-26 ENCOUNTER — Other Ambulatory Visit: Payer: Self-pay

## 2023-11-26 NOTE — Progress Notes (Signed)
Patient was left a voice mail regarding delay in delivery, medication will be filled 11/26/23 and be delivered on 11/27/23 to verifed address of: Verified address: [address] [place] [address]

## 2023-12-01 ENCOUNTER — Ambulatory Visit: Payer: Commercial Managed Care - PPO | Admitting: Psychology

## 2023-12-01 DIAGNOSIS — F411 Generalized anxiety disorder: Secondary | ICD-10-CM | POA: Diagnosis not present

## 2023-12-01 DIAGNOSIS — F900 Attention-deficit hyperactivity disorder, predominantly inattentive type: Secondary | ICD-10-CM

## 2023-12-01 DIAGNOSIS — F320 Major depressive disorder, single episode, mild: Secondary | ICD-10-CM

## 2023-12-01 DIAGNOSIS — F330 Major depressive disorder, recurrent, mild: Secondary | ICD-10-CM

## 2023-12-01 NOTE — Progress Notes (Signed)
PROGRESS NOTE:    Name: Olivia [name] Date: 12/01/2023 MRN: [id number] DOB: 03-12-1966 PCP: Willow Ora, MD  Time spent: 4:00 PM - 4:58 PM  Annual Review: 05/25/2024  Today I met with  Olivia [name] in remote video (Caregility) face-to-face individual psychotherapy.  Distance Site: [name]: Dr Odette Horns Remote Office Consent: Obtained verbal consent to transmit session remotely.   Patient is aware of the limitations inherent in participating in virtual therapy.   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 She is normally a high functioning and driven individual and it is a struggle to get motivated to do anything. If it is necessary for work she does it and does a good job, but it is not the same. She has gained 70 pounds in less than 9 months and it has impacted her self image. She is also peri menopausal and these hormonal changes are also likely to be contributing to her changing mood states.   A year and a half ago her father had quadrupel by-pass surgery and he was recently diagnosed with cancer. 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  Willadene states that she was diagnosed with adult ADHD and responded well to medications.  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)   [name] he is on the ASD, he is a Holiday representative and attends [employer]   Mental Status Exam: Appearance:   Casual     Behavior:  Appropriate  Motor:  Normal  Speech/Language:   NA  Affect:  Appropriate  Mood:  normal  Thought process:  normal  Thought content:    WNL  Sensory/Perceptual disturbances:    WNL  Orientation:  oriented to person, place, time/date, situation, and day of week  Attention:  Good  Concentration:  Good  Memory:  WNL  Fund of knowledge:   Good  Insight:    Good  Judgment:   Good  Impulse  Control:  Good    Individualized Treatment Plan                Strengths: bright, verbal, motivated, self aware, self advocate   Supports: spouse, family, friends, colleagues   Goal/Needs for Treatment:  In order of importance to patient  1) Develop healthy interpersonal relationships that lead to the alleviation and help prevent the relapse of depression.  2) Learn and implement coping skills that result in a reduction of anxiety and worry, and improved functioning.   3) Combine skills learned in therapy into a new daily approach to managing ADHD.   Client Statement of Needs: requests assistance with piecing back together her life as it once was before she got depressed (burnt out).   Treatment Level: Weekly Individual Outpatient Psychotherapy.  Symptoms: autonomic hyperactivity (e.g., palpitations, shortness of breath, dry mouth, trouble swallowing, nausea, diarrhea).  Childhood history of Attention Deficit Disorder (ADD) that was either diagnosed or later concluded due to the symptoms of behavioral problems at school, impulsivity, temper outbursts, and lack of concentration.  Depressed or irritable mood. Diminished interest in or enjoyment of activities. Easily distracted and drawn from task at hand.  Excessive and/or unrealistic worry that is difficult to control occurring more days than not for at least 6 months about a number of events or activities.  Feelings of hopelessness, worthlessness, or inappropriate guilt.  Lack of  energy.   Poor concentration and indecisiveness.  Restless and fidgety; unable to be sedentary for more than a short time.   Client Treatment Preferences: Continue with current therapist   Healthcare consumer's goal for treatment:  Psychologist, [name], Ph.D. will support the patient's ability to achieve the goals identified. Cognitive Behavioral Therapy, Dialectical Behavioral Therapy, Motivational Interviewing, SPACE, parent training, and other  evidenced-based practices will be used to promote progress towards healthy functioning.   Healthcare consumer Olivia [name] will: Actively participate in therapy, working towards healthy functioning.    *Justification for Continuation/Discontinuation of Goal: R=Revised, O=Ongoing, A=Achieved, D=Discontinued  Goal 1) Develop healthy interpersonal relationships that lead to the alleviation and help prevent the relapse of depression.  5 Point Likert rating baseline date: 05/28/2021 Target Date Goal Was reviewed Status Code Progress towards goal/Likert rating  05/29/2023 05/28/2022         O 3/5 - learning to set better limits and boundaries, still hasn't been able to regulate her daily routines  05/25/2024 05/26/2023         O        3.75 - pt reestablished friendship but has not be consistent in reaching out, has improved her relationship with her parents with appropriate limits.  Is more accepting of her relationship with her estranged daughter as healthy for her and not a failure        Goal 2) Learn and implement coping skills that result in a reduction of anxiety and worry, and improved daily functioning.  5 Point Likert rating baseline date: 05/28/2021  Target Date Goal Was reviewed Status Code Progress towards goal/Likert rating  05/29/2023 05/28/2022          O 2/5 - learning to use her skills more consistently  05/25/2024 05/26/2023          O 3.5 - pt is learning to use her skills and better manage her unrealistic and perfectionist tendencies that keep her stressed and anxious        Goal 3) Combine skills learned in therapy into a new daily approach to managing ADHD.  5 Point Likert rating baseline date: 05/28/2021  Target Date Goal Was reviewed Status Code Progress towards goal/Likert rating  05/29/2023 05/28/2022          O 2/5 - learning to use her skills more consistently  05/25/2024 05/26/2023          O 3.75 - accepted the need and began medication, has been more aware, recognize,  acknowledge and accept the issues related ADHD and is better able to apply strategies. Pt is kinder to herself around her "limitations" or need for an alternative way of approaching tasks        This plan has been reviewed and created by the following participants:  This plan will be reviewed at least every 12 months. Date Behavioral Olivia Clinician Date Guardian/Patient   05/28/2022 [name], Ph.D.   05/28/2022 Olivia [name]  05/26/2023 [name], Ph.D.  05/26/2023 Olivia [name]              Diagnosis: Major Depressive Disorder, recurrent, mild Generalized Anxiety Disorder Attention Deficit Disorder, predominantly inattentive   Olivia [name] reports that her sleep was better this week.  She continues to use her [facility] app and has been more productive.  We d/ that it is keeping her focused on self care tasks and rewards her for her efforts.  Olivia [name] states that her daughter reached out  for the first time in two  years.  We d/e/p what occurred, how she felt that she called because she was worried about her grandmother, and where to go from here.     [name], PhD  Son: Olivia [name] (17) he is on the ASD Friend - Amy  Super Ego - Kathie Rhodes

## 2023-12-08 ENCOUNTER — Ambulatory Visit (INDEPENDENT_AMBULATORY_CARE_PROVIDER_SITE_OTHER): Payer: Commercial Managed Care - PPO | Admitting: Psychology

## 2023-12-08 DIAGNOSIS — F330 Major depressive disorder, recurrent, mild: Secondary | ICD-10-CM | POA: Diagnosis not present

## 2023-12-08 DIAGNOSIS — F320 Major depressive disorder, single episode, mild: Secondary | ICD-10-CM

## 2023-12-08 DIAGNOSIS — F900 Attention-deficit hyperactivity disorder, predominantly inattentive type: Secondary | ICD-10-CM

## 2023-12-08 DIAGNOSIS — F411 Generalized anxiety disorder: Secondary | ICD-10-CM

## 2023-12-08 NOTE — Progress Notes (Signed)
PROGRESS NOTE:    Name: Olivia [name] Date: 12/08/2023 MRN: [id number] DOB: 10/01/1966 PCP: Willow Ora, MD  Time spent: 4:00 PM - 4:57 PM  Annual Review: 05/25/2024  Today I met with  Olivia [name] in remote video (Caregility) face-to-face individual psychotherapy.  Distance Site: [name]: Dr Odette Horns Remote Office Consent: Obtained verbal consent to transmit session remotely.   Patient is aware of the limitations inherent in participating in virtual therapy.   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 She is normally a high functioning and driven individual and it is a struggle to get motivated to do anything. If it is necessary for work she does it and does a good job, but it is not the same. She has gained 70 pounds in less than 9 months and it has impacted her self image. She is also peri menopausal and these hormonal changes are also likely to be contributing to her changing mood states.   A year and a half ago her father had quadrupel by-pass surgery and he was recently diagnosed with cancer. 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  Olivia [name] states that she was diagnosed with adult ADHD and responded well to medications.  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)   [name] he is on the ASD, he is a Holiday representative and attends [employer]   Mental Status Exam: Appearance:   Casual     Behavior:  Appropriate  Motor:  Normal  Speech/Language:   NA  Affect:  Appropriate  Mood:  normal  Thought process:  normal  Thought content:    WNL  Sensory/Perceptual disturbances:    WNL  Orientation:  oriented to person, place, time/date, situation, and day of week  Attention:  Good  Concentration:  Good  Memory:  WNL  Fund of knowledge:   Good  Insight:    Good  Judgment:   Good   Impulse Control:  Good    Individualized Treatment Plan                Strengths: bright, verbal, motivated, self aware, self advocate   Supports: spouse, family, friends, colleagues   Goal/Needs for Treatment:  In order of importance to patient  1) Develop healthy interpersonal relationships that lead to the alleviation and help prevent the relapse of depression.  2) Learn and implement coping skills that result in a reduction of anxiety and worry, and improved functioning.   3) Combine skills learned in therapy into a new daily approach to managing ADHD.   Client Statement of Needs: requests assistance with piecing back together her life as it once was before she got depressed (burnt out).   Treatment Level: Weekly Individual Outpatient Psychotherapy.  Symptoms: autonomic hyperactivity (e.g., palpitations, shortness of breath, dry mouth, trouble swallowing, nausea, diarrhea).  Childhood history of Attention Deficit Disorder (ADD) that was either diagnosed or later concluded due to the symptoms of behavioral problems at school, impulsivity, temper outbursts, and lack of concentration.  Depressed or irritable mood. Diminished interest in or enjoyment of activities. Easily distracted and drawn from task at hand.  Excessive and/or unrealistic worry that is difficult to control occurring more days than not for at least 6 months about a number of events or activities.  Feelings of hopelessness, worthlessness, or inappropriate guilt.  Lack  of energy.   Poor concentration and indecisiveness.  Restless and fidgety; unable to be sedentary for more than a short time.   Client Treatment Preferences: Continue with current therapist   Healthcare consumer's goal for treatment:  Psychologist, [name], Ph.D. will support the patient's ability to achieve the goals identified. Cognitive Behavioral Therapy, Dialectical Behavioral Therapy, Motivational Interviewing, SPACE, parent training,  and other evidenced-based practices will be used to promote progress towards healthy functioning.   Healthcare consumer Kentrell "[name]" Jiggetts will: Actively participate in therapy, working towards healthy functioning.    *Justification for Continuation/Discontinuation of Goal: R=Revised, O=Ongoing, A=Achieved, D=Discontinued  Goal 1) Develop healthy interpersonal relationships that lead to the alleviation and help prevent the relapse of depression.  5 Point Likert rating baseline date: 05/28/2021 Target Date Goal Was reviewed Status Code Progress towards goal/Likert rating  05/29/2023 05/28/2022         O 3/5 - learning to set better limits and boundaries, still hasn't been able to regulate her daily routines  05/25/2024 05/26/2023         O        3.75 - pt reestablished friendship but has not be consistent in reaching out, has improved her relationship with her parents with appropriate limits.  Is more accepting of her relationship with her estranged daughter as healthy for her and not a failure        Goal 2) Learn and implement coping skills that result in a reduction of anxiety and worry, and improved daily functioning.  5 Point Likert rating baseline date: 05/28/2021  Target Date Goal Was reviewed Status Code Progress towards goal/Likert rating  05/29/2023 05/28/2022          O 2/5 - learning to use her skills more consistently  05/25/2024 05/26/2023          O 3.5 - pt is learning to use her skills and better manage her unrealistic and perfectionist tendencies that keep her stressed and anxious        Goal 3) Combine skills learned in therapy into a new daily approach to managing ADHD.  5 Point Likert rating baseline date: 05/28/2021  Target Date Goal Was reviewed Status Code Progress towards goal/Likert rating  05/29/2023 05/28/2022          O 2/5 - learning to use her skills more consistently  05/25/2024 05/26/2023          O 3.75 - accepted the need and began medication, has been more aware,  recognize, acknowledge and accept the issues related ADHD and is better able to apply strategies. Pt is kinder to herself around her "limitations" or need for an alternative way of approaching tasks        This plan has been reviewed and created by the following participants:  This plan will be reviewed at least every 12 months. Date Behavioral Olivia Clinician Date Guardian/Patient   05/28/2022 [name], Ph.D.   05/28/2022 [name]  05/26/2023 [name], Ph.D.  05/26/2023 Santina Evans "[name]" Andrey Farmer              Diagnosis: Major Depressive Disorder, recurrent, mild Generalized Anxiety Disorder Attention Deficit Disorder, predominantly inattentive   [name] reports that she came down with the Neuro Virus and was down most of the week.  We d/p that the stress of dealing with her mother, her father's anxiety about her mother, and getting sick more often.  We also d/p the stress of supporting her father  who is calling multiple times a day.  We d/p her usual strategies and family role during times of crisis, I challenged her to stay with her feelings, and reframed for her that to do so was actually validating and supportive of her father.  [name], PhD  Son: Shanon Ace (17) he is on the ASD Friend - Amy  Super Ego - Kathie Rhodes

## 2023-12-15 ENCOUNTER — Ambulatory Visit: Payer: Commercial Managed Care - PPO | Admitting: Psychology

## 2023-12-15 DIAGNOSIS — F900 Attention-deficit hyperactivity disorder, predominantly inattentive type: Secondary | ICD-10-CM

## 2023-12-15 DIAGNOSIS — F411 Generalized anxiety disorder: Secondary | ICD-10-CM

## 2023-12-15 DIAGNOSIS — F330 Major depressive disorder, recurrent, mild: Secondary | ICD-10-CM

## 2023-12-15 NOTE — Progress Notes (Signed)
 PROGRESS NOTE:    Name: Olivia [name] Date: 12/15/2023 MRN: [id number] DOB: 1966-04-22 PCP: Luevenia Saha, MD  Time spent: 4:00 PM - 4:57 PM  Annual Review: 05/25/2024  Today I met with  Olivia [name] in remote video (Caregility) face-to-face individual psychotherapy.  Distance Site: [name]: Dr Durand Gift Remote Office Consent: Obtained verbal consent to transmit session remotely.   Patient is aware of the limitations inherent in participating in virtual therapy.   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 She is normally a high functioning and driven individual and it is a struggle to get motivated to do anything. If it is necessary for work she does it and does a good job, but it is not the same. She has gained 70 pounds in less than 9 months and it has impacted her self image. She is also peri menopausal and these hormonal changes are also likely to be contributing to her changing mood states.   A year and a half ago her father had quadrupel by-pass surgery and he was recently diagnosed with cancer. 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  Olivia [name] states that she was diagnosed with adult ADHD and responded well to medications.  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)   Son: Health visitor (15) he is on the ASD, he is a Holiday representative and attends [employer]   Mental Status Exam: Appearance:   Casual     Behavior:  Appropriate  Motor:  Normal  Speech/Language:   NA  Affect:  Appropriate  Mood:  normal  Thought process:  normal  Thought content:    WNL  Sensory/Perceptual disturbances:    WNL  Orientation:  oriented to person, place, time/date, situation, and day of week  Attention:  Good  Concentration:  Good  Memory:  WNL  Fund of knowledge:   Good  Insight:    Good  Judgment:   Good   Impulse Control:  Good    Individualized Treatment Plan                Strengths: bright, verbal, motivated, self aware, self advocate   Supports: spouse, family, friends, colleagues   Goal/Needs for Treatment:  In order of importance to patient  1) Develop healthy interpersonal relationships that lead to the alleviation and help prevent the relapse of depression.  2) Learn and implement coping skills that result in a reduction of anxiety and worry, and improved functioning.   3) Combine skills learned in therapy into a new daily approach to managing ADHD.   Client Statement of Needs: requests assistance with piecing back together her life as it once was before she got depressed (burnt out).   Treatment Level: Weekly Individual Outpatient Psychotherapy.  Symptoms: autonomic hyperactivity (e.g., palpitations, shortness of breath, dry mouth, trouble swallowing, nausea, diarrhea).  Childhood history of Attention Deficit Disorder (ADD) that was either diagnosed or later concluded due to the symptoms of behavioral problems at school, impulsivity, temper outbursts, and lack of concentration.  Depressed or irritable mood. Diminished interest in or enjoyment of activities. Easily distracted and drawn from task at hand.  Excessive and/or unrealistic worry that is difficult to control occurring more days than not for at least 6 months about a number of events or activities.  Feelings of hopelessness, worthlessness, or inappropriate guilt.  Lack  of energy.   Poor concentration and indecisiveness.  Restless and fidgety; unable to be sedentary for more than a short time.   Client Treatment Preferences: Continue with current therapist   Healthcare consumer's goal for treatment:  Psychologist, Olivia [name], Ph.D. will support the patient's ability to achieve the goals identified. Cognitive Behavioral Therapy, Dialectical Behavioral Therapy, Motivational Interviewing, SPACE, parent training,  and other evidenced-based practices will be used to promote progress towards healthy functioning.   Healthcare consumer Olivia [name] will: Actively participate in therapy, working towards healthy functioning.    *Justification for Continuation/Discontinuation of Goal: R=Revised, O=Ongoing, A=Achieved, D=Discontinued  Goal 1) Develop healthy interpersonal relationships that lead to the alleviation and help prevent the relapse of depression.  5 Point Likert rating baseline date: 05/28/2021 Target Date Goal Was reviewed Status Code Progress towards goal/Likert rating  05/29/2023 05/28/2022         O 3/5 - learning to set better limits and boundaries, still hasn't been able to regulate her daily routines  05/25/2024 05/26/2023         O        3.75 - pt reestablished friendship but has not be consistent in reaching out, has improved her relationship with her parents with appropriate limits.  Is more accepting of her relationship with her estranged daughter as healthy for her and not a failure        Goal 2) Learn and implement coping skills that result in a reduction of anxiety and worry, and improved daily functioning.  5 Point Likert rating baseline date: 05/28/2021  Target Date Goal Was reviewed Status Code Progress towards goal/Likert rating  05/29/2023 05/28/2022          O 2/5 - learning to use her skills more consistently  05/25/2024 05/26/2023          O 3.5 - pt is learning to use her skills and better manage her unrealistic and perfectionist tendencies that keep her stressed and anxious        Goal 3) Combine skills learned in therapy into a new daily approach to managing ADHD.  5 Point Likert rating baseline date: 05/28/2021  Target Date Goal Was reviewed Status Code Progress towards goal/Likert rating  05/29/2023 05/28/2022          O 2/5 - learning to use her skills more consistently  05/25/2024 05/26/2023          O 3.75 - accepted the need and began medication, has been more aware,  recognize, acknowledge and accept the issues related ADHD and is better able to apply strategies. Pt is kinder to herself around her "limitations" or need for an alternative way of approaching tasks        This plan has been reviewed and created by the following participants:  This plan will be reviewed at least every 12 months. Date Behavioral Health Clinician Date Guardian/Patient   05/28/2022 Olivia [name], Ph.D.   05/28/2022 Arvilla Laud" [place]  05/26/2023 Olivia [name], Ph.D.  05/26/2023 Bynum Cassis "[name]" [place]              Diagnosis: Major Depressive Disorder, recurrent, mild Generalized Anxiety Disorder Attention Deficit Disorder, predominantly inattentive   [name] reports that she was feeling and sleeping somewhat better.  Feeling so sick and not being able to work  gave her time to think about a number of things.  We d/e/p and not of serious topics she was facing - her mother's deteriorating cognitive health, her  son's going to college soon, and for the first time is beginning to think about her retirement.  As we d/e/p these topics, I made inquires, gently challenged her  look deeper, and provided the necessary support and guidance to do this difficult work.   Olivia Greening, PhD  Son: Flossie Hussar (17) he is on the ASD Friend - Amy  Super Ego - Ninette Basque

## 2023-12-16 ENCOUNTER — Other Ambulatory Visit: Payer: Self-pay

## 2023-12-16 NOTE — Progress Notes (Signed)
Specialty Pharmacy Refill Coordination Note  Olivia [name] is a 57 y.o. female contacted today regarding refills of specialty medication(s) Ixekizumab Altamease Oiler)   Patient requested Delivery   Delivery date: 12/23/23   Verified address: [address]  [place] [address]   Medication will be filled on 12/22/23.

## 2023-12-22 ENCOUNTER — Other Ambulatory Visit: Payer: Self-pay

## 2023-12-22 ENCOUNTER — Ambulatory Visit: Payer: 59 | Admitting: Psychology

## 2023-12-23 ENCOUNTER — Ambulatory Visit (INDEPENDENT_AMBULATORY_CARE_PROVIDER_SITE_OTHER): Payer: Commercial Managed Care - PPO | Admitting: Psychology

## 2023-12-23 DIAGNOSIS — F411 Generalized anxiety disorder: Secondary | ICD-10-CM | POA: Diagnosis not present

## 2023-12-23 DIAGNOSIS — F330 Major depressive disorder, recurrent, mild: Secondary | ICD-10-CM

## 2023-12-23 DIAGNOSIS — F900 Attention-deficit hyperactivity disorder, predominantly inattentive type: Secondary | ICD-10-CM

## 2023-12-23 NOTE — Progress Notes (Signed)
PROGRESS NOTE:    Name: Olivia [name] Date: 12/23/2023 MRN: [id number] DOB: 20-Oct-1966 PCP: Willow Ora, MD  Time spent: 4:00 PM - 4:57 PM  Annual Review: 05/25/2024  Today I met with  Olivia [name] in remote video (Caregility) face-to-face individual psychotherapy.  Distance Site: [name]: Dr Odette Horns Remote Office Consent: Obtained verbal consent to transmit session remotely.   Patient is aware of the limitations inherent in participating in virtual therapy.   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 She is normally a high functioning and driven individual and it is a struggle to get motivated to do anything. If it is necessary for work she does it and does a good job, but it is not the same. She has gained 70 pounds in less than 9 months and it has impacted her self image. She is also peri menopausal and these hormonal changes are also likely to be contributing to her changing mood states.   A year and a half ago her father had quadrupel by-pass surgery and he was recently diagnosed with cancer. 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  Olivia [name] states that she was diagnosed with adult ADHD and responded well to medications.  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)   [name] he is on the ASD, he is a Holiday representative and attends [employer]   Mental Status Exam: Appearance:   Casual     Behavior:  Appropriate  Motor:  Normal  Speech/Language:   NA  Affect:  Appropriate  Mood:  normal  Thought process:  normal  Thought content:    WNL  Sensory/Perceptual disturbances:    WNL  Orientation:  oriented to person, place, time/date, situation, and day of week  Attention:  Good  Concentration:  Good  Memory:  WNL  Fund of knowledge:   Good  Insight:    Good  Judgment:   Good   Impulse Control:  Good    Individualized Treatment Plan                Strengths: bright, verbal, motivated, self aware, self advocate   Supports: spouse, family, friends, colleagues   Goal/Needs for Treatment:  In order of importance to patient  1) Develop healthy interpersonal relationships that lead to the alleviation and help prevent the relapse of depression.  2) Learn and implement coping skills that result in a reduction of anxiety and worry, and improved functioning.   3) Combine skills learned in therapy into a new daily approach to managing ADHD.   Client Statement of Needs: requests assistance with piecing back together her life as it once was before she got depressed (burnt out).   Treatment Level: Weekly Individual Outpatient Psychotherapy.  Symptoms: autonomic hyperactivity (e.g., palpitations, shortness of breath, dry mouth, trouble swallowing, nausea, diarrhea).  Childhood history of Attention Deficit Disorder (ADD) that was either diagnosed or later concluded due to the symptoms of behavioral problems at school, impulsivity, temper outbursts, and lack of concentration.  Depressed or irritable mood. Diminished interest in or enjoyment of activities. Easily distracted and drawn from task at hand.  Excessive and/or unrealistic worry that is difficult to control occurring more days than not for at least 6 months about a number of events or activities.  Feelings of hopelessness, worthlessness, or inappropriate guilt.  Lack  of energy.   Poor concentration and indecisiveness.  Restless and fidgety; unable to be sedentary for more than a short time.   Client Treatment Preferences: Continue with current therapist   Healthcare consumer's goal for treatment:  Psychologist, Olivia [name], Ph.D. will support the patient's ability to achieve the goals identified. Cognitive Behavioral Therapy, Dialectical Behavioral Therapy, Motivational Interviewing, SPACE, parent training,  and other evidenced-based practices will be used to promote progress towards healthy functioning.   Healthcare consumer Olivia [name] will: Actively participate in therapy, working towards healthy functioning.    *Justification for Continuation/Discontinuation of Goal: R=Revised, O=Ongoing, A=Achieved, D=Discontinued  Goal 1) Develop healthy interpersonal relationships that lead to the alleviation and help prevent the relapse of depression.  5 Point Likert rating baseline date: 05/28/2021 Target Date Goal Was reviewed Status Code Progress towards goal/Likert rating  05/29/2023 05/28/2022         O 3/5 - learning to set better limits and boundaries, still hasn't been able to regulate her daily routines  05/25/2024 05/26/2023         O        3.75 - pt reestablished friendship but has not be consistent in reaching out, has improved her relationship with her parents with appropriate limits.  Is more accepting of her relationship with her estranged daughter as healthy for her and not a failure        Goal 2) Learn and implement coping skills that result in a reduction of anxiety and worry, and improved daily functioning.  5 Point Likert rating baseline date: 05/28/2021  Target Date Goal Was reviewed Status Code Progress towards goal/Likert rating  05/29/2023 05/28/2022          O 2/5 - learning to use her skills more consistently  05/25/2024 05/26/2023          O 3.5 - pt is learning to use her skills and better manage her unrealistic and perfectionist tendencies that keep her stressed and anxious        Goal 3) Combine skills learned in therapy into a new daily approach to managing ADHD.  5 Point Likert rating baseline date: 05/28/2021  Target Date Goal Was reviewed Status Code Progress towards goal/Likert rating  05/29/2023 05/28/2022          O 2/5 - learning to use her skills more consistently  05/25/2024 05/26/2023          O 3.75 - accepted the need and began medication, has been more aware,  recognize, acknowledge and accept the issues related ADHD and is better able to apply strategies. Pt is kinder to herself around her "limitations" or need for an alternative way of approaching tasks        This plan has been reviewed and created by the following participants:  This plan will be reviewed at least every 12 months. Date Behavioral Olivia Clinician Date Guardian/Patient   05/28/2022 Olivia [name], Ph.D.   05/28/2022 Olivia [name]  05/26/2023 Olivia [name], Ph.D.  05/26/2023 Olivia [name]              Diagnosis: Major Depressive Disorder, recurrent, mild Generalized Anxiety Disorder Attention Deficit Disorder, predominantly inattentive   Olivia [name] reports that she got sick over the weekend and focused on sleep.  We d/p that she is having trouble falling asleep because she is thinking about her mother, and her father.  I provided support, praised her efforts, and provided guidance as she negotiates her mother's dementia.  I encouraged her to do slow somatic movements at bedtime and provided her with a few YouTube links.   Olivia Favors, PhD  Son: Olivia [name] (17) he is on the ASD Friend - Amy  Super Ego - Kathie Rhodes

## 2023-12-29 ENCOUNTER — Ambulatory Visit: Payer: Commercial Managed Care - PPO | Admitting: Psychology

## 2023-12-29 DIAGNOSIS — F411 Generalized anxiety disorder: Secondary | ICD-10-CM | POA: Diagnosis not present

## 2023-12-29 DIAGNOSIS — F900 Attention-deficit hyperactivity disorder, predominantly inattentive type: Secondary | ICD-10-CM

## 2023-12-29 DIAGNOSIS — F330 Major depressive disorder, recurrent, mild: Secondary | ICD-10-CM | POA: Diagnosis not present

## 2023-12-29 NOTE — Progress Notes (Unsigned)
 PROGRESS NOTE:    Name: Olivia [name] Date: 12/29/2023 MRN: [id number] DOB: July 22, 1966 PCP: Willow Ora, MD  Time spent: 4:00 PM - 4:57 PM  Annual Review: 05/25/2024  Today I met with  Olivia [name] in remote video (Caregility) face-to-face individual psychotherapy.  Distance Site: [name]: Dr Odette Horns Remote Office Consent: Obtained verbal consent to transmit session remotely.   Patient is aware of the limitations inherent in participating in virtual therapy.   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 She is normally a high functioning and driven individual and it is a struggle to get motivated to do anything. If it is necessary for work she does it and does a good job, but it is not the same. She has gained 70 pounds in less than 9 months and it has impacted her self image. She is also peri menopausal and these hormonal changes are also likely to be contributing to her changing mood states.   A year and a half ago her father had quadrupel by-pass surgery and he was recently diagnosed with cancer. 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  Olivia [name] states that she was diagnosed with adult ADHD and responded well to medications.  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)   [name] he is on the ASD, he is a Holiday representative and attends [employer]   Mental Status Exam: Appearance:   Casual     Behavior:  Appropriate  Motor:  Normal  Speech/Language:   NA  Affect:  Appropriate  Mood:  normal  Thought process:  normal  Thought content:    WNL  Sensory/Perceptual disturbances:    WNL  Orientation:  oriented to person, place, time/date, situation, and day of week  Attention:  Good  Concentration:  Good  Memory:  WNL  Fund of knowledge:   Good  Insight:    Good  Judgment:   Good   Impulse Control:  Good    Individualized Treatment Plan                Strengths: bright, verbal, motivated, self aware, self advocate   Supports: spouse, family, friends, colleagues   Goal/Needs for Treatment:  In order of importance to patient  1) Develop healthy interpersonal relationships that lead to the alleviation and help prevent the relapse of depression.  2) Learn and implement coping skills that result in a reduction of anxiety and worry, and improved functioning.   3) Combine skills learned in therapy into a new daily approach to managing ADHD.   Client Statement of Needs: requests assistance with piecing back together her life as it once was before she got depressed (burnt out).   Treatment Level: Weekly Individual Outpatient Psychotherapy.  Symptoms: autonomic hyperactivity (e.g., palpitations, shortness of breath, dry mouth, trouble swallowing, nausea, diarrhea).  Childhood history of Attention Deficit Disorder (ADD) that was either diagnosed or later concluded due to the symptoms of behavioral problems at school, impulsivity, temper outbursts, and lack of concentration.  Depressed or irritable mood. Diminished interest in or enjoyment of activities. Easily distracted and drawn from task at hand.  Excessive and/or unrealistic worry that is difficult to control occurring more days than not for at least 6 months about a number of events or activities.  Feelings of hopelessness, worthlessness, or inappropriate guilt.  Lack  of energy.   Poor concentration and indecisiveness.  Restless and fidgety; unable to be sedentary for more than a short time.   Client Treatment Preferences: Continue with current therapist   Healthcare consumer's goal for treatment:  Psychologist, Olivia [name], Ph.D. will support the patient's ability to achieve the goals identified. Cognitive Behavioral Therapy, Dialectical Behavioral Therapy, Motivational Interviewing, SPACE, parent training,  and other evidenced-based practices will be used to promote progress towards healthy functioning.   Healthcare consumer Olivia [name] will: Actively participate in therapy, working towards healthy functioning.    *Justification for Continuation/Discontinuation of Goal: R=Revised, O=Ongoing, A=Achieved, D=Discontinued  Goal 1) Develop healthy interpersonal relationships that lead to the alleviation and help prevent the relapse of depression.  5 Point Likert rating baseline date: 05/28/2021 Target Date Goal Was reviewed Status Code Progress towards goal/Likert rating  05/29/2023 05/28/2022         O 3/5 - learning to set better limits and boundaries, still hasn't been able to regulate her daily routines  05/25/2024 05/26/2023         O        3.75 - pt reestablished friendship but has not be consistent in reaching out, has improved her relationship with her parents with appropriate limits.  Is more accepting of her relationship with her estranged daughter as healthy for her and not a failure        Goal 2) Learn and implement coping skills that result in a reduction of anxiety and worry, and improved daily functioning.  5 Point Likert rating baseline date: 05/28/2021  Target Date Goal Was reviewed Status Code Progress towards goal/Likert rating  05/29/2023 05/28/2022          O 2/5 - learning to use her skills more consistently  05/25/2024 05/26/2023          O 3.5 - pt is learning to use her skills and better manage her unrealistic and perfectionist tendencies that keep her stressed and anxious        Goal 3) Combine skills learned in therapy into a new daily approach to managing ADHD.  5 Point Likert rating baseline date: 05/28/2021  Target Date Goal Was reviewed Status Code Progress towards goal/Likert rating  05/29/2023 05/28/2022          O 2/5 - learning to use her skills more consistently  05/25/2024 05/26/2023          O 3.75 - accepted the need and began medication, has been more aware,  recognize, acknowledge and accept the issues related ADHD and is better able to apply strategies. Pt is kinder to herself around her "limitations" or need for an alternative way of approaching tasks        This plan has been reviewed and created by the following participants:  This plan will be reviewed at least every 12 months. Date Behavioral Olivia Clinician Date Guardian/Patient   05/28/2022 Olivia [name], Ph.D.   05/28/2022 Olivia [name]  05/26/2023 Olivia [name], Ph.D.  05/26/2023 Olivia [name]              Diagnosis: Major Depressive Disorder, recurrent, mild Generalized Anxiety Disorder Attention Deficit Disorder, predominantly inattentive   Olivia [name] reports that she had to work on sleep this week.  She had trouble falling asleep, and we agreed that her body circadian rhythms were very confused.  She had a situation with her son this week, and we d/p what occurred, how she resolved the problem.  I helped give her perspective on  ASD and how it impacts his behavior.   Olivia Favors, PhD  Son: Olivia [name] (17) he is on the ASD Olivia [name] - Amy  Super Ego - Kathie Rhodes

## 2024-01-05 ENCOUNTER — Ambulatory Visit: Payer: 59 | Admitting: Psychology

## 2024-01-05 DIAGNOSIS — F900 Attention-deficit hyperactivity disorder, predominantly inattentive type: Secondary | ICD-10-CM | POA: Diagnosis not present

## 2024-01-05 DIAGNOSIS — F411 Generalized anxiety disorder: Secondary | ICD-10-CM

## 2024-01-05 DIAGNOSIS — F330 Major depressive disorder, recurrent, mild: Secondary | ICD-10-CM | POA: Diagnosis not present

## 2024-01-05 NOTE — Progress Notes (Signed)
 PROGRESS NOTE:    Name: Olivia [name] Date: 01/05/2024 MRN: [id number] DOB: 06/01/1966 PCP: Willow Ora, MD  Time spent: 4:00 PM - 4:57 PM  Annual Review: 05/25/2024  Today I met with  [name] in remote video (Caregility) face-to-face individual psychotherapy.  Distance Site: [name]: Dr Odette Horns Remote Office Consent: Obtained verbal consent to transmit session remotely.   Patient is aware of the limitations inherent in participating in virtual therapy.   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 She is normally a high functioning and driven individual and it is a struggle to get motivated to do anything. If it is necessary for work she does it and does a good job, but it is not the same. She has gained 70 pounds in less than 9 months and it has impacted her self image. She is also peri menopausal and these hormonal changes are also likely to be contributing to her changing mood [name].   A year and a half ago her father had quadrupel by-pass surgery and he was recently diagnosed with cancer. 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  Olivia [name].  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)   [name] he is on the ASD, he is a Holiday representative and attends [employer]   Mental Status Exam: Appearance:   Casual     Behavior:  Appropriate  Motor:  Normal  Speech/Language:   NA  Affect:  Appropriate  Mood:  normal  Thought process:  normal  Thought content:    WNL  Sensory/Perceptual disturbances:    WNL  Orientation:  oriented to person, place, time/date, situation, and day of week  Attention:  Good  Concentration:  Good  Memory:  WNL  Fund of knowledge:   Good  Insight:    Good  Judgment:   Good   Impulse Control:  Good    Individualized Treatment Plan                Strengths: bright, verbal, motivated, self aware, self advocate   Supports: spouse, family, friends, colleagues   Goal/Needs for Treatment:  In order of importance to patient  1) Develop healthy interpersonal relationships that lead to the alleviation and help prevent the relapse of depression.  2) Learn and implement coping skills that result in a reduction of anxiety and worry, and improved functioning.   3) Combine skills learned in therapy into a new daily approach to managing ADHD.   Client Statement of Needs: requests assistance with piecing back together her life as it once was before she got depressed (burnt out).   Treatment Level: Weekly Individual Outpatient Psychotherapy.  Symptoms: autonomic hyperactivity (e.g., palpitations, shortness of breath, dry mouth, trouble swallowing, nausea, diarrhea).  Childhood history of Attention Deficit Disorder (ADD) that was either diagnosed or later concluded due to the symptoms of behavioral problems at school, impulsivity, temper outbursts, and lack of concentration.  Depressed or irritable mood. Diminished interest in or enjoyment of activities. Easily distracted and drawn from task at hand.  Excessive and/or unrealistic worry that is difficult to control occurring more days than not for at least 6 months about a number of events or activities.  Feelings of hopelessness, worthlessness, or inappropriate guilt.  Lack  of energy.   Poor concentration and indecisiveness.  Restless and fidgety; unable to be sedentary for more than a short time.   Client Treatment Preferences: Continue with current therapist   Healthcare consumer's goal for treatment:  Psychologist, [name], Ph.D. will support the patient's ability to achieve the goals identified. Cognitive Behavioral Therapy, Dialectical Behavioral Therapy, Motivational Interviewing, SPACE, parent training,  and other evidenced-based practices will be used to promote progress towards healthy functioning.   Healthcare consumer Olivia [name] will: Actively participate in therapy, working towards healthy functioning.    *Justification for Continuation/Discontinuation of Goal: R=Revised, O=Ongoing, A=Achieved, D=Discontinued  Goal 1) Develop healthy interpersonal relationships that lead to the alleviation and help prevent the relapse of depression.  5 Point Likert rating baseline date: 05/28/2021 Target Date Goal Was reviewed Status Code Progress towards goal/Likert rating  05/29/2023 05/28/2022         O 3/5 - learning to set better limits and boundaries, still hasn't been able to regulate her daily routines  05/25/2024 05/26/2023         O        3.75 - pt reestablished friendship but has not be consistent in reaching out, has improved her relationship with her parents with appropriate limits.  Is more accepting of her relationship with her estranged daughter as healthy for her and not a failure        Goal 2) Learn and implement coping skills that result in a reduction of anxiety and worry, and improved daily functioning.  5 Point Likert rating baseline date: 05/28/2021  Target Date Goal Was reviewed Status Code Progress towards goal/Likert rating  05/29/2023 05/28/2022          O 2/5 - learning to use her skills more consistently  05/25/2024 05/26/2023          O 3.5 - pt is learning to use her skills and better manage her unrealistic and perfectionist tendencies that keep her stressed and anxious        Goal 3) Combine skills learned in therapy into a new daily approach to managing ADHD.  5 Point Likert rating baseline date: 05/28/2021  Target Date Goal Was reviewed Status Code Progress towards goal/Likert rating  05/29/2023 05/28/2022          O 2/5 - learning to use her skills more consistently  05/25/2024 05/26/2023          O 3.75 - accepted the need and began medication, has been more aware,  recognize, acknowledge and accept the issues related ADHD and is better able to apply strategies. Pt is kinder to herself around her "limitations" or need for an alternative way of approaching tasks        This plan has been reviewed and created by the following participants:  This plan will be reviewed at least every 12 months. Date Behavioral Olivia Clinician Date Guardian/Patient   05/28/2022 [name], Ph.D.   05/28/2022 [name]  05/26/2023 [name], Ph.D.  05/26/2023 Santina Evans "Olivia [name]" Andrey Farmer              Diagnosis: Major Depressive Disorder, recurrent, mild Generalized Anxiety Disorder Attention Deficit Disorder, predominantly inattentive   Olivia [name] that she continues to work on her sleep this week.  She [name] that she continues to struggle to flip her days back.  We focused on the fact that she is at least getting good blocks of sleep most nights.  Lastly, we d/e/p recent interactions  with her daughter, re-opening up feelings of loss/rejection, and questions about whether there will ever be a reconciliation with her daughter.  I drew parallels between her relationship with her daughter and an earlier "estrangement" between her daughter and her mother which eventually was reconciled.  This gave Olivia [name] some things to think about.   [name], PhD  Son: Shanon Ace (17) he is on the ASD Friend - Amy  Super Ego - Kathie Rhodes

## 2024-01-08 ENCOUNTER — Other Ambulatory Visit (HOSPITAL_COMMUNITY): Payer: Self-pay

## 2024-01-12 ENCOUNTER — Ambulatory Visit: Payer: 59 | Admitting: Psychology

## 2024-01-14 ENCOUNTER — Ambulatory Visit (INDEPENDENT_AMBULATORY_CARE_PROVIDER_SITE_OTHER): Admitting: Psychology

## 2024-01-14 DIAGNOSIS — F330 Major depressive disorder, recurrent, mild: Secondary | ICD-10-CM

## 2024-01-14 DIAGNOSIS — F900 Attention-deficit hyperactivity disorder, predominantly inattentive type: Secondary | ICD-10-CM | POA: Diagnosis not present

## 2024-01-14 DIAGNOSIS — F411 Generalized anxiety disorder: Secondary | ICD-10-CM

## 2024-01-14 NOTE — Progress Notes (Unsigned)
 PROGRESS NOTE:    Name: Olivia [name] Date: 01/14/2024 MRN: [id number] DOB: 1966/03/11 PCP: Willow Ora, MD  Time spent: 4:00 PM - 4:58 PM  Annual Review: 05/25/2024  Today I met with  Olivia [name] in remote video (Caregility) face-to-face individual psychotherapy.  Distance Site: [name]: Dr Odette Horns Remote Office Consent: Obtained verbal consent to transmit session remotely.   Patient is aware of the limitations inherent in participating in virtual therapy.   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 She is normally a high functioning and driven individual and it is a struggle to get motivated to do anything. If it is necessary for work she does it and does a good job, but it is not the same. She has gained 70 pounds in less than 9 months and it has impacted her self image. She is also peri menopausal and these hormonal changes are also likely to be contributing to her changing mood states.   A year and a half ago her father had quadrupel by-pass surgery and he was recently diagnosed with cancer. 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  Olivia [name] states that she was diagnosed with adult ADHD and responded well to medications.  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)   Son: Health visitor (15) he is on the ASD, he is a Holiday representative and attends [employer]   Mental Status Exam: Appearance:   Casual     Behavior:  Appropriate  Motor:  Normal  Speech/Language:   NA  Affect:  Appropriate  Mood:  normal  Thought process:  normal  Thought content:    WNL  Sensory/Perceptual disturbances:    WNL  Orientation:  oriented to person, place, time/date, situation, and day of week  Attention:  Good  Concentration:  Good  Memory:  WNL  Fund of knowledge:   Good  Insight:    Good  Judgment:   Good   Impulse Control:  Good    Individualized Treatment Plan                Strengths: bright, verbal, motivated, self aware, self advocate   Supports: spouse, family, friends, colleagues   Goal/Needs for Treatment:  In order of importance to patient  1) Develop healthy interpersonal relationships that lead to the alleviation and help prevent the relapse of depression.  2) Learn and implement coping skills that result in a reduction of anxiety and worry, and improved functioning.   3) Combine skills learned in therapy into a new daily approach to managing ADHD.   Client Statement of Needs: requests assistance with piecing back together her life as it once was before she got depressed (burnt out).   Treatment Level: Weekly Individual Outpatient Psychotherapy.  Symptoms: autonomic hyperactivity (e.g., palpitations, shortness of breath, dry mouth, trouble swallowing, nausea, diarrhea).  Childhood history of Attention Deficit Disorder (ADD) that was either diagnosed or later concluded due to the symptoms of behavioral problems at school, impulsivity, temper outbursts, and lack of concentration.  Depressed or irritable mood. Diminished interest in or enjoyment of activities. Easily distracted and drawn from task at hand.  Excessive and/or unrealistic worry that is difficult to control occurring more days than not for at least 6 months about a number of events or activities.  Feelings of hopelessness, worthlessness, or inappropriate guilt.  Lack  of energy.   Poor concentration and indecisiveness.  Restless and fidgety; unable to be sedentary for more than a short time.   Client Treatment Preferences: Continue with current therapist   Healthcare consumer's goal for treatment:  Psychologist, [name], Ph.D. will support the patient's ability to achieve the goals identified. Cognitive Behavioral Therapy, Dialectical Behavioral Therapy, Motivational Interviewing, SPACE, parent training,  and other evidenced-based practices will be used to promote progress towards healthy functioning.   Healthcare consumer Olivia [name] will: Actively participate in therapy, working towards healthy functioning.    *Justification for Continuation/Discontinuation of Goal: R=Revised, O=Ongoing, A=Achieved, D=Discontinued  Goal 1) Develop healthy interpersonal relationships that lead to the alleviation and help prevent the relapse of depression.  5 Point Likert rating baseline date: 05/28/2021 Target Date Goal Was reviewed Status Code Progress towards goal/Likert rating  05/29/2023 05/28/2022         O 3/5 - learning to set better limits and boundaries, still hasn't been able to regulate her daily routines  05/25/2024 05/26/2023         O        3.75 - pt reestablished friendship but has not be consistent in reaching out, has improved her relationship with her parents with appropriate limits.  Is more accepting of her relationship with her estranged daughter as healthy for her and not a failure        Goal 2) Learn and implement coping skills that result in a reduction of anxiety and worry, and improved daily functioning.  5 Point Likert rating baseline date: 05/28/2021  Target Date Goal Was reviewed Status Code Progress towards goal/Likert rating  05/29/2023 05/28/2022          O 2/5 - learning to use her skills more consistently  05/25/2024 05/26/2023          O 3.5 - pt is learning to use her skills and better manage her unrealistic and perfectionist tendencies that keep her stressed and anxious        Goal 3) Combine skills learned in therapy into a new daily approach to managing ADHD.  5 Point Likert rating baseline date: 05/28/2021  Target Date Goal Was reviewed Status Code Progress towards goal/Likert rating  05/29/2023 05/28/2022          O 2/5 - learning to use her skills more consistently  05/25/2024 05/26/2023          O 3.75 - accepted the need and began medication, has been more aware,  recognize, acknowledge and accept the issues related ADHD and is better able to apply strategies. Pt is kinder to herself around her "limitations" or need for an alternative way of approaching tasks        This plan has been reviewed and created by the following participants:  This plan will be reviewed at least every 12 months. Date Behavioral Health Clinician Date Guardian/Patient   05/28/2022 [name], Ph.D.   05/28/2022 Olivia [name]  05/26/2023 [name], Ph.D.  05/26/2023 Olivia [name]              Diagnosis: Major Depressive Disorder, recurrent, mild Generalized Anxiety Disorder Attention Deficit Disorder, predominantly inattentive   Olivia [name] reports that she accompanied her mother (and father) to a medical procedure.  We d/p the challenges they are having with her mother's dementia, supporting her father, and struggling to transfer accounts.  We d/p how stressed she is feeling and how it is starting effect her physical  health.  We d/p recognizing that her mother is deteriorating rapidly, and her feelings of grief.  I provided the support and guidance she needed to navigate this challenging episode in her life.   [name], PhD  Son: Olivia [name] (17) he is on the ASD Friend - Olivia [name]  Super Ego - Olivia [name]

## 2024-01-15 ENCOUNTER — Other Ambulatory Visit (HOSPITAL_COMMUNITY): Payer: Self-pay

## 2024-01-19 ENCOUNTER — Other Ambulatory Visit (HOSPITAL_BASED_OUTPATIENT_CLINIC_OR_DEPARTMENT_OTHER): Payer: Self-pay

## 2024-01-19 ENCOUNTER — Other Ambulatory Visit: Payer: Self-pay

## 2024-01-19 ENCOUNTER — Ambulatory Visit: Payer: 59 | Admitting: Psychology

## 2024-01-19 ENCOUNTER — Other Ambulatory Visit: Payer: Self-pay | Admitting: Pharmacist

## 2024-01-19 ENCOUNTER — Other Ambulatory Visit (HOSPITAL_COMMUNITY): Payer: Self-pay

## 2024-01-19 ENCOUNTER — Other Ambulatory Visit: Payer: Self-pay | Admitting: Internal Medicine

## 2024-01-19 MED ORDER — TALTZ 80 MG/ML ~~LOC~~ SOAJ: SUBCUTANEOUS | 4 refills | Status: DC

## 2024-01-19 MED ORDER — TALTZ 80 MG/ML ~~LOC~~ SOAJ
80.0000 mg | SUBCUTANEOUS | 4 refills | Status: DC
  Filled 2024-01-19: qty 1, 28d supply, fill #0
  Filled 2024-02-12: qty 1, 28d supply, fill #1
  Filled 2024-03-11 (×2): qty 1, 28d supply, fill #2
  Filled 2024-04-14: qty 1, 28d supply, fill #3
  Filled 2024-05-10: qty 1, 28d supply, fill #4

## 2024-01-19 NOTE — Progress Notes (Signed)
 Specialty Pharmacy Refill Coordination Note  Olivia [name] is a 58 y.o. female contacted today regarding refills of specialty medication(s) Ixekizumab Altamease Oiler)   Patient requested Delivery   Delivery date: 01/22/24   Verified address: [address]  [place] [address]   Medication will be filled on 03.19.25.

## 2024-01-20 ENCOUNTER — Other Ambulatory Visit (HOSPITAL_COMMUNITY): Payer: Self-pay

## 2024-01-21 ENCOUNTER — Other Ambulatory Visit (HOSPITAL_COMMUNITY): Payer: Self-pay

## 2024-01-21 ENCOUNTER — Other Ambulatory Visit: Payer: Self-pay

## 2024-01-21 ENCOUNTER — Ambulatory Visit: Admitting: Psychology

## 2024-01-21 DIAGNOSIS — F900 Attention-deficit hyperactivity disorder, predominantly inattentive type: Secondary | ICD-10-CM

## 2024-01-21 DIAGNOSIS — F330 Major depressive disorder, recurrent, mild: Secondary | ICD-10-CM | POA: Diagnosis not present

## 2024-01-21 DIAGNOSIS — F411 Generalized anxiety disorder: Secondary | ICD-10-CM | POA: Diagnosis not present

## 2024-01-21 NOTE — Progress Notes (Signed)
 PROGRESS NOTE:    Name: Olivia [name] Date: 01/21/2024 MRN: [id number] DOB: 1966-10-15 PCP: Willow Ora, MD  Time spent: 4:00 PM - 4:58 PM  Annual Review: 05/25/2024  Today I met with  [name] in remote video (Caregility) face-to-face individual psychotherapy.  Distance Site: [name]: Dr Odette Horns Remote Office Consent: Obtained verbal consent to transmit session remotely.   Patient is aware of the limitations inherent in participating in virtual therapy.   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 She is normally a high functioning and driven individual and it is a struggle to get motivated to do anything. If it is necessary for work she does it and does a good job, but it is not the same. She has gained 70 pounds in less than 9 months and it has impacted her self image. She is also peri menopausal and these hormonal changes are also likely to be contributing to her changing mood states.   A year and a half ago her father had quadrupel by-pass surgery and he was recently diagnosed with cancer. 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  Alexcia states that she was diagnosed with adult ADHD and responded well to medications.  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)   Son: Health visitor (15) he is on the ASD, he is a Holiday representative and attends [employer]   Mental Status Exam: Appearance:   Casual     Behavior:  Appropriate  Motor:  Normal  Speech/Language:   NA  Affect:  Appropriate  Mood:  normal  Thought process:  normal  Thought content:    WNL  Sensory/Perceptual disturbances:    WNL  Orientation:  oriented to person, place, time/date, situation, and day of week  Attention:  Good  Concentration:  Good  Memory:  WNL  Fund of knowledge:   Good  Insight:    Good  Judgment:   Good   Impulse Control:  Good    Individualized Treatment Plan                Strengths: bright, verbal, motivated, self aware, self advocate   Supports: spouse, family, friends, colleagues   Goal/Needs for Treatment:  In order of importance to patient  1) Develop healthy interpersonal relationships that lead to the alleviation and help prevent the relapse of depression.  2) Learn and implement coping skills that result in a reduction of anxiety and worry, and improved functioning.   3) Combine skills learned in therapy into a new daily approach to managing ADHD.   Client Statement of Needs: requests assistance with piecing back together her life as it once was before she got depressed (burnt out).   Treatment Level: Weekly Individual Outpatient Psychotherapy.  Symptoms: autonomic hyperactivity (e.g., palpitations, shortness of breath, dry mouth, trouble swallowing, nausea, diarrhea).  Childhood history of Attention Deficit Disorder (ADD) that was either diagnosed or later concluded due to the symptoms of behavioral problems at school, impulsivity, temper outbursts, and lack of concentration.  Depressed or irritable mood. Diminished interest in or enjoyment of activities. Easily distracted and drawn from task at hand.  Excessive and/or unrealistic worry that is difficult to control occurring more days than not for at least 6 months about a number of events or activities.  Feelings of hopelessness, worthlessness, or inappropriate guilt.  Lack  of energy.   Poor concentration and indecisiveness.  Restless and fidgety; unable to be sedentary for more than a short time.   Client Treatment Preferences: Continue with current therapist   Healthcare consumer's goal for treatment:  Psychologist, [name], Ph.D. will support the patient's ability to achieve the goals identified. Cognitive Behavioral Therapy, Dialectical Behavioral Therapy, Motivational Interviewing, SPACE, parent training,  and other evidenced-based practices will be used to promote progress towards healthy functioning.   Healthcare consumer Ka "[name]" Davenport will: Actively participate in therapy, working towards healthy functioning.    *Justification for Continuation/Discontinuation of Goal: R=Revised, O=Ongoing, A=Achieved, D=Discontinued  Goal 1) Develop healthy interpersonal relationships that lead to the alleviation and help prevent the relapse of depression.  5 Point Likert rating baseline date: 05/28/2021 Target Date Goal Was reviewed Status Code Progress towards goal/Likert rating  05/29/2023 05/28/2022         O 3/5 - learning to set better limits and boundaries, still hasn't been able to regulate her daily routines  05/25/2024 05/26/2023         O        3.75 - pt reestablished friendship but has not be consistent in reaching out, has improved her relationship with her parents with appropriate limits.  Is more accepting of her relationship with her estranged daughter as healthy for her and not a failure        Goal 2) Learn and implement coping skills that result in a reduction of anxiety and worry, and improved daily functioning.  5 Point Likert rating baseline date: 05/28/2021  Target Date Goal Was reviewed Status Code Progress towards goal/Likert rating  05/29/2023 05/28/2022          O 2/5 - learning to use her skills more consistently  05/25/2024 05/26/2023          O 3.5 - pt is learning to use her skills and better manage her unrealistic and perfectionist tendencies that keep her stressed and anxious        Goal 3) Combine skills learned in therapy into a new daily approach to managing ADHD.  5 Point Likert rating baseline date: 05/28/2021  Target Date Goal Was reviewed Status Code Progress towards goal/Likert rating  05/29/2023 05/28/2022          O 2/5 - learning to use her skills more consistently  05/25/2024 05/26/2023          O 3.75 - accepted the need and began medication, has been more aware,  recognize, acknowledge and accept the issues related ADHD and is better able to apply strategies. Pt is kinder to herself around her "limitations" or need for an alternative way of approaching tasks        This plan has been reviewed and created by the following participants:  This plan will be reviewed at least every 12 months. Date Behavioral Health Clinician Date Guardian/Patient   05/28/2022 [name], Ph.D.   05/28/2022 [name]  05/26/2023 [name], Ph.D.  05/26/2023 Santina Evans "[name]" Andrey Farmer              Diagnosis: Major Depressive Disorder, recurrent, mild Generalized Anxiety Disorder Attention Deficit Disorder, predominantly inattentive   [name] reports that she accompanied her father to urgent care because he was running a high fever for days.  We d/p the challenges is having with her mother's dementia, and her aging and anxious father.  We d/p continued her feelings of grief.  I provided the support  and guidance she needed to navigate this challenging episode in her life.  Lastly, we d/ how to continue to take care of herself so that she can continue to help care her parents and family.  We agreed that she would add a goal to pause and do breathing exercises at least twice a day Dorothyann Gibbs).   [name], PhD  Son: Shanon Ace (17) he is on the ASD Friend - Amy  Super Ego - Kathie Rhodes

## 2024-01-26 ENCOUNTER — Ambulatory Visit (INDEPENDENT_AMBULATORY_CARE_PROVIDER_SITE_OTHER): Payer: 59 | Admitting: Psychology

## 2024-01-26 DIAGNOSIS — F900 Attention-deficit hyperactivity disorder, predominantly inattentive type: Secondary | ICD-10-CM | POA: Diagnosis not present

## 2024-01-26 DIAGNOSIS — F320 Major depressive disorder, single episode, mild: Secondary | ICD-10-CM

## 2024-01-26 DIAGNOSIS — F411 Generalized anxiety disorder: Secondary | ICD-10-CM

## 2024-01-26 DIAGNOSIS — F330 Major depressive disorder, recurrent, mild: Secondary | ICD-10-CM | POA: Diagnosis not present

## 2024-01-26 NOTE — Progress Notes (Unsigned)
 PROGRESS NOTE:    Name: Olivia [name] Date: 01/26/2024 MRN: [id number] DOB: 08-13-1966 PCP: Willow Ora, MD  Time spent: 4:00 PM - 4:58 PM  Annual Review: 05/25/2024  Today I met with  Olivia [name] in remote video ([facility]) face-to-face individual psychotherapy.  Distance Site: [name]: Dr Odette Horns Remote Office Consent: Obtained verbal consent to transmit session remotely.   Patient is aware of the limitations inherent in participating in virtual therapy.   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 She is normally a high functioning and driven individual and it is a struggle to get motivated to do anything. If it is necessary for work she does it and does a good job, but it is not the same. She has gained 70 pounds in less than 9 months and it has impacted her self image. She is also peri menopausal and these hormonal changes are also likely to be contributing to her changing mood [name].   A year and a half ago her father had quadrupel by-pass surgery and he was recently diagnosed with cancer. 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  Olivia [name] that she was diagnosed with adult ADHD and responded well to medications.  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)   [name] he is on the ASD, he is a Holiday representative and attends [employer]   Mental Status Exam: Appearance:   Casual     Behavior:  Appropriate  Motor:  Normal  Speech/Language:   NA  Affect:  Appropriate  Mood:  normal  Thought process:  normal  Thought content:    WNL  Sensory/Perceptual disturbances:    WNL  Orientation:  oriented to person, place, time/date, situation, and day of week  Attention:  Good  Concentration:  Good  Memory:  WNL  Fund of knowledge:   Good  Insight:    Good  Judgment:   Good   Impulse Control:  Good    Individualized Treatment Plan                Strengths: bright, verbal, motivated, self aware, self advocate   Supports: spouse, family, friends, colleagues   Goal/Needs for Treatment:  In order of importance to patient  1) Develop healthy interpersonal relationships that lead to the alleviation and help prevent the relapse of depression.  2) Learn and implement coping skills that result in a reduction of anxiety and worry, and improved functioning.   3) Combine skills learned in therapy into a new daily approach to managing ADHD.   Client Statement of Needs: requests assistance with piecing back together her life as it once was before she got depressed (burnt out).   Treatment Level: Weekly Individual Outpatient Psychotherapy.  Symptoms: autonomic hyperactivity (e.g., palpitations, shortness of breath, dry mouth, trouble swallowing, nausea, diarrhea).  Childhood history of Attention Deficit Disorder (ADD) that was either diagnosed or later concluded due to the symptoms of behavioral problems at school, impulsivity, temper outbursts, and lack of concentration.  Depressed or irritable mood. Diminished interest in or enjoyment of activities. Easily distracted and drawn from task at hand.  Excessive and/or unrealistic worry that is difficult to control occurring more days than not for at least 6 months about a number of events or activities.  Feelings of hopelessness, worthlessness, or inappropriate guilt.  Lack  of energy.   Poor concentration and indecisiveness.  Restless and fidgety; unable to be sedentary for more than a short time.   Client Treatment Preferences: Continue with current therapist   Healthcare consumer's goal for treatment:  Psychologist, [name], Ph.D. will support the patient's ability to achieve the goals identified. Cognitive Behavioral Therapy, Dialectical Behavioral Therapy, Motivational Interviewing, SPACE, parent training,  and other evidenced-based practices will be used to promote progress towards healthy functioning.   Healthcare consumer Olivia [name] will: Actively participate in therapy, working towards healthy functioning.    *Justification for Continuation/Discontinuation of Goal: R=Revised, O=Ongoing, A=Achieved, D=Discontinued  Goal 1) Develop healthy interpersonal relationships that lead to the alleviation and help prevent the relapse of depression.  5 Point Likert rating baseline date: 05/28/2021 Target Date Goal Was reviewed Status Code Progress towards goal/Likert rating  05/29/2023 05/28/2022         O 3/5 - learning to set better limits and boundaries, still hasn't been able to regulate her daily routines  05/25/2024 05/26/2023         O        3.75 - pt reestablished friendship but has not be consistent in reaching out, has improved her relationship with her parents with appropriate limits.  Is more accepting of her relationship with her estranged daughter as healthy for her and not a failure        Goal 2) Learn and implement coping skills that result in a reduction of anxiety and worry, and improved daily functioning.  5 Point Likert rating baseline date: 05/28/2021  Target Date Goal Was reviewed Status Code Progress towards goal/Likert rating  05/29/2023 05/28/2022          O 2/5 - learning to use her skills more consistently  05/25/2024 05/26/2023          O 3.5 - pt is learning to use her skills and better manage her unrealistic and perfectionist tendencies that keep her stressed and anxious        Goal 3) Combine skills learned in therapy into a new daily approach to managing ADHD.  5 Point Likert rating baseline date: 05/28/2021  Target Date Goal Was reviewed Status Code Progress towards goal/Likert rating  05/29/2023 05/28/2022          O 2/5 - learning to use her skills more consistently  05/25/2024 05/26/2023          O 3.75 - accepted the need and began medication, has been more aware,  recognize, acknowledge and accept the issues related ADHD and is better able to apply strategies. Pt is kinder to herself around her "limitations" or need for an alternative way of approaching tasks        This plan has been reviewed and created by the following participants:  This plan will be reviewed at least every 12 months. Date Behavioral Olivia Clinician Date Guardian/Patient   05/28/2022 [name], Ph.D.   05/28/2022 Olivia [name]  05/26/2023 [name], Ph.D.  05/26/2023 Olivia [name]              Diagnosis: Major Depressive Disorder, recurrent, mild Generalized Anxiety Disorder Attention Deficit Disorder, predominantly inattentive   Olivia [name] reports that she got normal sleep this past week and feels like a human being.  She agreed to add a goal to pause and do breathing exercises at least twice a day Olivia Gibbs) and this helped too.  We talked about her son who is on the  autism spectrum.  We d/ and p/s how to move him forward with "adult" responsibilities and independent living.  Although he will be living at home while he attends college, he is asking for more freedom.  We also d/e/p that her husband is less prepared for Nate's becoming more independent.  I offered some suggestions for how to have a conversation with him and understand better what his concerns are.  Home Practice:  set up family meeting to d/ household responsibilities  [name], PhD  Son: Olivia [name] (17) he is on the ASD Olivia [name]  [employer] - Olivia [name]

## 2024-02-02 ENCOUNTER — Ambulatory Visit: Payer: 59 | Admitting: Psychology

## 2024-02-04 ENCOUNTER — Telehealth: Admitting: Physician Assistant

## 2024-02-04 ENCOUNTER — Other Ambulatory Visit (HOSPITAL_BASED_OUTPATIENT_CLINIC_OR_DEPARTMENT_OTHER): Payer: Self-pay

## 2024-02-04 DIAGNOSIS — H1013 Acute atopic conjunctivitis, bilateral: Secondary | ICD-10-CM | POA: Diagnosis not present

## 2024-02-04 MED ORDER — OLOPATADINE HCL 0.2 % OP SOLN
1.0000 [drp] | Freq: Every day | OPHTHALMIC | 0 refills | Status: DC
  Filled 2024-02-04: qty 2.5, 50d supply, fill #0

## 2024-02-04 NOTE — Progress Notes (Signed)
 Virtual Visit Consent   Olivia [name], you are scheduled for a virtual visit with a [facility] provider today. Just as with appointments in the office, your consent must be obtained to participate. Your consent will be active for this visit and any virtual visit you may have with one of our providers in the next 365 days. If you have a MyChart account, a copy of this consent can be sent to you electronically.  As this is a virtual visit, video technology does not allow for your provider to perform a traditional examination. This may limit your provider's ability to fully assess your condition. If your provider identifies any concerns that need to be evaluated in person or the need to arrange testing (such as labs, EKG, etc.), we will make arrangements to do so. Although advances in technology are sophisticated, we cannot ensure that it will always work on either your end or our end. If the connection with a video visit is poor, the visit may have to be switched to a telephone visit. With either a video or telephone visit, we are not always able to ensure that we have a secure connection.  By engaging in this virtual visit, you consent to the provision of healthcare and authorize for your insurance to be billed (if applicable) for the services provided during this visit. Depending on your insurance coverage, you may receive a charge related to this service.  I need to obtain your verbal consent now. Are you willing to proceed with your visit today? Olivia [name] has provided verbal consent on 02/04/2024 for a virtual visit (video or telephone). Olivia [name], [name]  Date: 02/04/2024 11:47 AM   Virtual Visit via Video Note   I, Olivia [name], connected with  Olivia [name]  ([id number], 1966/07/01) on 02/04/24 at 11:45 AM EDT by a video-enabled telemedicine application and verified that I am speaking with the correct person using two identifiers.  Location: Patient: Virtual Visit  Location Patient: Home Provider: Virtual Visit Location Provider: Home Office   I discussed the limitations of evaluation and management by telemedicine and the availability of in person appointments. The patient expressed understanding and agreed to proceed.    History of Present Illness: Olivia [name] is a 58 y.o. who identifies as a female who was assigned female at birth, and is being seen today for concern of bilateral allergic conjunctivitis. Endorses symptom onset about a week ago with watering of eyes (R>L). About 4 days ago noting some inflammation and swelling of her R upper eyelid. Has been applying cold compresses with some improvement in swelling. Denies tenderness or purulent drainage. Has been taking daily antihistamine (Zyrtec) and using OTC drops as well.  HPI: HPI  Problems:  Patient Active Problem List   Diagnosis Date Noted   History of total thyroidectomy 01/17/2021   History of parathyroid surgery 2021 01/17/2021   Post-surgical hypothyroidism 01/17/2021   History of hyperparathyroidism 05/22/2020   Ventricular bigeminy 11/17/2018   Major depression, recurrent, chronic (HCC) 11/17/2018   Atopic dermatitis 08/19/2018   Elevated coronary artery calcium score, 90th percentile for age and gender 08/16/2018   Vitamin D deficiency 01/05/2018   Psoriasis 01/05/2018   Chronic low back pain with sciatica 01/05/2018   IUD (intrauterine device) in place, Liletta 12/07/2017   PCOS (polycystic ovarian syndrome) 10/25/2014   Migraine with aura 01/11/2014   History of laparoscopic adjustable gastric banding 05/08/2012   Mixed hyperlipidemia 04/15/2008   Morbid obesity (HCC) 04/15/2008  Allergic rhinitis 04/15/2008   Allergic asthma 04/15/2008    Allergies:  Allergies  Allergen Reactions   Cephalosporins Anaphylaxis and Other (See Comments)    Caused hypotension and a fever   Egg-Derived Products Hives, Swelling and Other (See Comments)    Angioedema   Penicillins  Anaphylaxis    Has patient had a PCN reaction causing immediate rash, facial/tongue/throat swelling, SOB or lightheadedness with hypotension: Yes Has patient had a PCN reaction causing severe rash involving mucus membranes or skin necrosis: No Has patient had a PCN reaction that required hospitalization: Observed X12 hrs Has patient had a PCN reaction occurring within the last 10 years: No If all of the above answers are "NO", then may proceed with Cephalosporin use.    Sulfa Drugs Cross Reactors Other (See Comments)    Liver failure   Adhesive [Tape] Hives and Itching    Cannot wear for any length of time   Effexor [Venlafaxine] Other (See Comments)    Lightheadedness, dizziness, and headaches resulted   Medications:  Current Outpatient Medications:    Olopatadine HCl 0.2 % SOLN, Apply 1 drop to eye daily., Disp: 2.5 mL, Rfl: 0   albuterol (PROVENTIL HFA) 108 (90 Base) MCG/ACT inhaler, Inhale 2 puffs into the lungs every 6 (six) hours as needed for wheezing or shortness of breath., Disp: 18 g, Rfl: 2   albuterol (PROVENTIL) (2.5 MG/3ML) 0.083% nebulizer solution, Take 3 mLs (2.5 mg total) by nebulization every 6 (six) hours as needed for wheezing or shortness of breath., Disp: 75 mL, Rfl: 12   buPROPion (WELLBUTRIN XL) 150 MG 24 hr tablet, Take 1 tablet (150 mg total) by mouth daily., Disp: 90 tablet, Rfl: 3   cetirizine (ZYRTEC) 10 MG tablet, Take 10 mg by mouth daily as needed for allergies. , Disp: , Rfl:    clobetasol cream (TEMOVATE) 0.05 %, Apply 1 application Externally Twice a day 14 days, Disp: 45 g, Rfl: 1   clobetasol ointment (TEMOVATE) 0.05 %, Apply 1 application Externally Twice a day 14 days, Disp: 45 g, Rfl: 1   escitalopram (LEXAPRO) 20 MG tablet, Take 1 tablet (20 mg total) by mouth daily., Disp: 90 tablet, Rfl: 3   famotidine (PEPCID) 20 MG tablet, Take 20 mg by mouth daily as needed for heartburn or indigestion., Disp: , Rfl:    ixekizumab (TALTZ) 80 MG/ML pen, Inject 80  mg (1ml) into the skin once every 4 weeks., Disp: 1 mL, Rfl: 4   levothyroxine (SYNTHROID) 100 MCG tablet, Take 1 tablet (100 mcg total) by mouth daily., Disp: 90 tablet, Rfl: 3   liothyronine (CYTOMEL) 25 MCG tablet, Take 1.5 tablets (37.5 mcg total) by mouth daily., Disp: 140 tablet, Rfl: 1   metoprolol tartrate (LOPRESSOR) 25 MG tablet, Take 12.5 mg by mouth daily as needed., Disp: 60 tablet, Rfl: 1   metroNIDAZOLE (METROCREAM) 0.75 % cream, Apply 1 Application topically 2 (two) times daily., Disp: 45 g, Rfl: 1   Multiple Vitamin (MULITIVITAMIN WITH MINERALS) TABS, Take 1 tablet by mouth daily., Disp: , Rfl:    progesterone (PROMETRIUM) 100 MG capsule, Take 1 capsule (100 mg total) by mouth at bedtime., Disp: 60 capsule, Rfl: 2   rosuvastatin (CRESTOR) 10 MG tablet, Take 1 tablet (10 mg total) by mouth at bedtime., Disp: 90 tablet, Rfl: 3   triamcinolone cream (KENALOG) 0.1 %, Apply as directed to affected area twice a day for 10-14 days as needed for flares, Disp: 454 g, Rfl: 0   Vitamin D,  Cholecalciferol, 50 MCG (2000 UT) CAPS, Take 2,000 Units by mouth daily., Disp: , Rfl:   Observations/Objective: Patient is well-developed, well-nourished in no acute distress.  Resting comfortably at home.  Head is normocephalic, atraumatic.  No labored breathing. Speech is clear and coherent with logical content.  Patient is alert and oriented at baseline.  Bilateral conjunctival injection appreciated on visual inspection. R upper eyelid with mild erythema and swelling. Pupils are equal and round. No active matting or crusting appreciated.   Assessment and Plan: 1. Acute allergic conjunctivitis, bilateral (Primary) - Olopatadine HCl 0.2 % SOLN; Apply 1 drop to eye daily.  Dispense: 2.5 mL; Refill: 0  Will have her continue antihistamine and compresses. Will add on Olopatadine 0.2% drops once daily. Will also put on hold, a script for Optivar drops in case not resolving with the Olopatadine. She is to  follow-up with her Ophthalmologist for any non-resolving, new or worsening symptoms despite treatment.   Follow Up Instructions: I discussed the assessment and treatment plan with the patient. The patient was provided an opportunity to ask questions and all were answered. The patient agreed with the plan and demonstrated an understanding of the instructions.  A copy of instructions were sent to the patient via MyChart unless otherwise noted below.    The patient was advised to call back or seek an in-person evaluation if the symptoms worsen or if the condition fails to improve as anticipated.    Olivia Climes, PA-C

## 2024-02-04 NOTE — Patient Instructions (Signed)
 [name], thank you for joining [name], PA-C for today's virtual visit.  While this provider is not your primary care provider (PCP), if your PCP is located in our provider database this encounter information will be shared with them immediately following your visit.   A [facility] MyChart account gives you access to today's visit and all your visits, tests, and labs performed at [facility] " click here if you don't have a [facility] MyChart account or go to [email or web address]  Consent: (Patient) Olivia [name] provided verbal consent for this virtual visit at the beginning of the encounter.  Current Medications:  Current Outpatient Medications:    albuterol (PROVENTIL HFA) 108 (90 Base) MCG/ACT inhaler, Inhale 2 puffs into the lungs every 6 (six) hours as needed for wheezing or shortness of breath., Disp: 18 g, Rfl: 2   albuterol (PROVENTIL) (2.5 MG/3ML) 0.083% nebulizer solution, Take 3 mLs (2.5 mg total) by nebulization every 6 (six) hours as needed for wheezing or shortness of breath., Disp: 75 mL, Rfl: 12   ALPRAZolam (XANAX) 0.5 MG tablet, Take 1 tablet (0.5 mg total) by mouth 2 (two) times daily as needed for anxiety., Disp: 30 tablet, Rfl: 0   buPROPion (WELLBUTRIN XL) 150 MG 24 hr tablet, Take 1 tablet (150 mg total) by mouth daily., Disp: 90 tablet, Rfl: 3   cetirizine (ZYRTEC) 10 MG tablet, Take 10 mg by mouth daily as needed for allergies. , Disp: , Rfl:    clobetasol cream (TEMOVATE) 0.05 %, Apply 1 application Externally Twice a day 14 days, Disp: 45 g, Rfl: 1   clobetasol ointment (TEMOVATE) 0.05 %, Apply 1 application Externally Twice a day 14 days, Disp: 45 g, Rfl: 1   escitalopram (LEXAPRO) 20 MG tablet, Take 1 tablet (20 mg total) by mouth daily., Disp: 90 tablet, Rfl: 3   famotidine (PEPCID) 20 MG tablet, Take 20 mg by mouth daily as needed for heartburn or indigestion., Disp: , Rfl:    ixekizumab (TALTZ) 80 MG/ML pen, Inject 80  mg (1ml) into the skin once every 4 weeks., Disp: 1 mL, Rfl: 4   levothyroxine (SYNTHROID) 100 MCG tablet, Take 1 tablet (100 mcg total) by mouth daily., Disp: 90 tablet, Rfl: 3   liothyronine (CYTOMEL) 25 MCG tablet, Take 1.5 tablets (37.5 mcg total) by mouth daily., Disp: 140 tablet, Rfl: 1   metoprolol tartrate (LOPRESSOR) 25 MG tablet, Take 12.5 mg by mouth daily as needed., Disp: 60 tablet, Rfl: 1   metroNIDAZOLE (METROCREAM) 0.75 % cream, Apply 1 Application topically 2 (two) times daily., Disp: 45 g, Rfl: 1   Multiple Vitamin (MULITIVITAMIN WITH MINERALS) TABS, Take 1 tablet by mouth daily., Disp: , Rfl:    naproxen (NAPROSYN) 500 MG tablet, Take 1 tablet (500 mg total) by mouth 2 (two) times daily with a meal., Disp: 20 tablet, Rfl: 0   progesterone (PROMETRIUM) 100 MG capsule, Take 1 capsule (100 mg total) by mouth at bedtime., Disp: 60 capsule, Rfl: 2   rosuvastatin (CRESTOR) 10 MG tablet, Take 1 tablet (10 mg total) by mouth at bedtime., Disp: 90 tablet, Rfl: 3   triamcinolone cream (KENALOG) 0.1 %, Apply as directed to affected area twice a day for 10-14 days as needed for flares, Disp: 454 g, Rfl: 0   Vitamin D, Cholecalciferol, 50 MCG (2000 UT) CAPS, Take 2,000 Units by mouth daily., Disp: , Rfl:    Medications ordered in this encounter:  No orders of the defined types were  placed in this encounter.    *If you need refills on other medications prior to your next appointment, please contact your pharmacy*  Follow-Up: Call back or seek an in-person evaluation if the symptoms worsen or if the condition fails to improve as anticipated.  [facility] [phone]  Other Instructions Allergic Conjunctivitis, Adult  Allergic conjunctivitis is inflammation of the clear membrane that covers the white part of your eye and the inner surface of your eyelid. This area is called the conjunctiva. This condition can make your eye red or pink. It can also make your eye feel  itchy. This condition is not contagious. This means that it cannot be spread from one person to another person. What are the causes? This condition is caused by allergens. These are things that can cause an allergic reaction in some people. Common allergens include: Outdoor allergens, such as: Pollen, including pollen from grass and weeds. Mold. Car fumes. Indoor allergens, such as: Dust. Smoke. Mold. Proteins in a pet's pee (urine), saliva, or dander. Proteins that build up in contact lenses. What increases the risk? You are more likely to develop this condition if you have a family history of these things: Allergies. Conditions that you get because of allergens, such as asthma or inflammation of the skin (eczema). What are the signs or symptoms? Symptoms of this condition include eyes that are: Itchy. Red. Watery. Puffy. Your eyes may also: Sting or burn. Have clear fluid draining from them. Have thick mucus coming from them. This happens in severe cases. How is this treated? Treatment for this condition may include: Using cold, wet cloths (cold compresses) to soothe itching and swelling. Washing the face, hair, and clothing to remove allergens. Using eye drops. These may include: Eye drops that block allergies. Eye drops that reduce swelling and irritation. Steroid eye drops if other treatments have not worked. Oral antihistamine medicines. These medicines lessen your allergies. You may need these if eye drops do not help or are difficult to use. Air purifier at home and work. Wrap around sunglasses. This may help to block allergens from reaching the eye. Not wearing contact lenses, if the doctor has found that contact lenses caused your symptoms. Use daily wear disposal contact lenses instead. Follow these instructions at home: Eye care Place a cool, clean washcloth on your eye for 10-20 minutes. Do this 3-4 times a day. Do not touch or rub your eyes. Do not wear  contact lenses until the inflammation is gone. Wear glasses instead. Do not wear eye makeup until the inflammation is gone. General instructions Try not to be around things that you are allergic to. Take or apply over-the-counter and prescription medicines only as told by your doctor. These include any eye drops. Drink enough fluid to keep your pee pale yellow. Keep all follow-up visits. Contact a doctor if: Your symptoms get worse. Your symptoms do not get better with treatment. You have mild eye pain. You are sensitive to light. You have spots or blisters on your eyes. You have pus coming from your eye. You have a fever. Get help right away if: You have redness, swelling, or other symptoms in only one eye. You cannot see well. You have other vision changes. You have very bad eye pain. Summary Allergic conjunctivitis is caused by allergens. It can make your eye red or pink, and it can make your eye feel itchy. This condition cannot be spread from one person to another person. Avoid things that you  are allergic to. Take or apply over-the-counter and prescription medicines only as told by your doctor. These include any eye drops. Contact your doctor if your symptoms get worse or they do not get better with treatment. This information is not intended to replace advice given to you by your health care provider. Make sure you discuss any questions you have with your health care provider. Document Revised: 12/28/2021 Document Reviewed: 12/28/2021 Elsevier Patient Education  2024 [employer]vier Inc[employer]   If you have been instructed to have an in-person evaluation today at a local Urgent Care facility, please use the link below. It will take you to a list of all of our available [facility], including address, phone number and hours of operation. Please do not delay care.  [facility]  If you or a family member do not have a primary care provider, use the link below to  schedule a visit and establish care. When you choose a [facility] primary care physician or advanced practice provider, you gain a long-term partner in health. Find a Primary Care Provider  Learn more about [facility]'s in-office and virtual care options: [facility] - Get Care Now

## 2024-02-05 ENCOUNTER — Ambulatory Visit: Admitting: Psychology

## 2024-02-05 DIAGNOSIS — F411 Generalized anxiety disorder: Secondary | ICD-10-CM | POA: Diagnosis not present

## 2024-02-05 DIAGNOSIS — Z600 Problems of adjustment to life-cycle transitions: Secondary | ICD-10-CM

## 2024-02-05 DIAGNOSIS — F330 Major depressive disorder, recurrent, mild: Secondary | ICD-10-CM

## 2024-02-05 DIAGNOSIS — F900 Attention-deficit hyperactivity disorder, predominantly inattentive type: Secondary | ICD-10-CM

## 2024-02-05 NOTE — Progress Notes (Signed)
 PROGRESS NOTE:    Name: Olivia [name] Date: 02/05/2024 MRN: [id number] DOB: 26-Apr-1966 PCP: Willow Ora, MD  Time spent: 8:00 AM - 8:58 AM  Annual Review: 05/25/2024  Today I met with  [name] in remote video (Caregility) face-to-face individual psychotherapy.  Distance Site: [name]: Dr Odette Horns Remote Office Consent: Obtained verbal consent to transmit session remotely.   Patient is aware of the limitations inherent in participating in virtual therapy.   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 She is normally a high functioning and driven individual and it is a struggle to get motivated to do anything. If it is necessary for work she does it and does a good job, but it is not the same. She has gained 70 pounds in less than 9 months and it has impacted her self image. She is also peri menopausal and these hormonal changes are also likely to be contributing to her changing mood states.   A year and a half ago her father had quadrupel by-pass surgery and he was recently diagnosed with cancer. 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  Cierra states that she was diagnosed with adult ADHD and responded well to medications.  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)   Son: Health visitor (15) he is on the ASD, he is a Holiday representative and attends [employer]   Mental Status Exam: Appearance:   Casual     Behavior:  Appropriate  Motor:  Normal  Speech/Language:   NA  Affect:  Appropriate  Mood:  normal  Thought process:  normal  Thought content:    WNL  Sensory/Perceptual disturbances:    WNL  Orientation:  oriented to person, place, time/date, situation, and day of week  Attention:  Good  Concentration:  Good  Memory:  WNL  Fund of knowledge:   Good  Insight:    Good  Judgment:   Good   Impulse Control:  Good    Individualized Treatment Plan                Strengths: bright, verbal, motivated, self aware, self advocate   Supports: spouse, family, friends, colleagues   Goal/Needs for Treatment:  In order of importance to patient  1) Develop healthy interpersonal relationships that lead to the alleviation and help prevent the relapse of depression.  2) Learn and implement coping skills that result in a reduction of anxiety and worry, and improved functioning.   3) Combine skills learned in therapy into a new daily approach to managing ADHD.   Client Statement of Needs: requests assistance with piecing back together her life as it once was before she got depressed (burnt out).   Treatment Level: Weekly Individual Outpatient Psychotherapy.  Symptoms: autonomic hyperactivity (e.g., palpitations, shortness of breath, dry mouth, trouble swallowing, nausea, diarrhea).  Childhood history of Attention Deficit Disorder (ADD) that was either diagnosed or later concluded due to the symptoms of behavioral problems at school, impulsivity, temper outbursts, and lack of concentration.  Depressed or irritable mood. Diminished interest in or enjoyment of activities. Easily distracted and drawn from task at hand.  Excessive and/or unrealistic worry that is difficult to control occurring more days than not for at least 6 months about a number of events or activities.  Feelings of hopelessness, worthlessness, or inappropriate guilt.  Lack  of energy.   Poor concentration and indecisiveness.  Restless and fidgety; unable to be sedentary for more than a short time.   Client Treatment Preferences: Continue with current therapist   Healthcare consumer's goal for treatment:  Psychologist, [name], Ph.D. will support the patient's ability to achieve the goals identified. Cognitive Behavioral Therapy, Dialectical Behavioral Therapy, Motivational Interviewing, SPACE, parent training,  and other evidenced-based practices will be used to promote progress towards healthy functioning.   Healthcare consumer Nicoletta "[name]" Tavano will: Actively participate in therapy, working towards healthy functioning.    *Justification for Continuation/Discontinuation of Goal: R=Revised, O=Ongoing, A=Achieved, D=Discontinued  Goal 1) Develop healthy interpersonal relationships that lead to the alleviation and help prevent the relapse of depression.  5 Point Likert rating baseline date: 05/28/2021 Target Date Goal Was reviewed Status Code Progress towards goal/Likert rating  05/29/2023 05/28/2022         O 3/5 - learning to set better limits and boundaries, still hasn't been able to regulate her daily routines  05/25/2024 05/26/2023         O        3.75 - pt reestablished friendship but has not be consistent in reaching out, has improved her relationship with her parents with appropriate limits.  Is more accepting of her relationship with her estranged daughter as healthy for her and not a failure        Goal 2) Learn and implement coping skills that result in a reduction of anxiety and worry, and improved daily functioning.  5 Point Likert rating baseline date: 05/28/2021  Target Date Goal Was reviewed Status Code Progress towards goal/Likert rating  05/29/2023 05/28/2022          O 2/5 - learning to use her skills more consistently  05/25/2024 05/26/2023          O 3.5 - pt is learning to use her skills and better manage her unrealistic and perfectionist tendencies that keep her stressed and anxious        Goal 3) Combine skills learned in therapy into a new daily approach to managing ADHD.  5 Point Likert rating baseline date: 05/28/2021  Target Date Goal Was reviewed Status Code Progress towards goal/Likert rating  05/29/2023 05/28/2022          O 2/5 - learning to use her skills more consistently  05/25/2024 05/26/2023          O 3.75 - accepted the need and began medication, has been more aware,  recognize, acknowledge and accept the issues related ADHD and is better able to apply strategies. Pt is kinder to herself around her "limitations" or need for an alternative way of approaching tasks        This plan has been reviewed and created by the following participants:  This plan will be reviewed at least every 12 months. Date Behavioral Health Clinician Date Guardian/Patient   05/28/2022 [name], Ph.D.   05/28/2022 [name]  05/26/2023 [name], Ph.D.  05/26/2023 Santina Evans "[name]" Andrey Farmer              Diagnosis: Major Depressive Disorder, recurrent, mild Generalized Anxiety Disorder Attention Deficit Disorder, predominantly inattentive   [name] reports that she got conjunctivitis.  We d/e/p an important talk she had with her mother, her lingering feeling of shame and her life long drive to make her parents proud of her."  I offered some insights, made connections between the present and the past that made  [name] look at things from a totally different perspective .  We d/p the healing that can occur, which felt out of reach once her mother's memory began to fail, and now seem possible with some encouragement.   Home Practice:  set up family meeting to d/ household responsibilities  [name], PhD  Son: Shanon Ace (17) he is on the ASD Friend - Amy  [employer] - Kathie Rhodes

## 2024-02-06 ENCOUNTER — Other Ambulatory Visit (HOSPITAL_BASED_OUTPATIENT_CLINIC_OR_DEPARTMENT_OTHER): Payer: Self-pay

## 2024-02-08 ENCOUNTER — Other Ambulatory Visit: Payer: Self-pay | Admitting: Family

## 2024-02-08 MED ORDER — AZELASTINE HCL 0.05 % OP SOLN: 2.0000 [drp] | Freq: Two times a day (BID) | OPHTHALMIC | 12 refills | Status: DC

## 2024-02-08 NOTE — Addendum Note (Signed)
 Addended by: Jannifer Rodney A on: 02/08/2024 09:17 AM   Modules accepted: Orders

## 2024-02-09 ENCOUNTER — Ambulatory Visit (INDEPENDENT_AMBULATORY_CARE_PROVIDER_SITE_OTHER): Payer: 59 | Admitting: Psychology

## 2024-02-09 DIAGNOSIS — F900 Attention-deficit hyperactivity disorder, predominantly inattentive type: Secondary | ICD-10-CM

## 2024-02-09 DIAGNOSIS — F411 Generalized anxiety disorder: Secondary | ICD-10-CM

## 2024-02-09 DIAGNOSIS — Z600 Problems of adjustment to life-cycle transitions: Secondary | ICD-10-CM

## 2024-02-09 DIAGNOSIS — F330 Major depressive disorder, recurrent, mild: Secondary | ICD-10-CM

## 2024-02-09 NOTE — Progress Notes (Signed)
 PROGRESS NOTE:    Name: Olivia [name] Date: 02/09/2024 MRN: [id number] DOB: [birth year] PCP: Willow Ora, MD  Time spent: 8:00 AM - 8:58 AM  Annual Review: 05/25/2024  Today I met with  Olivia [name] in remote video (Caregility) face-to-face individual psychotherapy.  Distance Site: [name]: Dr Odette Horns Remote Office Consent: Obtained verbal consent to transmit session remotely.   Patient is aware of the limitations inherent in participating in virtual therapy.   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 She is normally a high functioning and driven individual and it is a struggle to get motivated to do anything. If it is necessary for work she does it and does a good job, but it is not the same. She has gained 70 pounds in less than 9 months and it has impacted her self image. She is also peri menopausal and these hormonal changes are also likely to be contributing to her changing mood states.   A year and a half ago her father had quadrupel by-pass surgery and he was recently diagnosed with cancer. 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  Olivia [name] states that she was diagnosed with adult ADHD and responded well to medications.  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)   [name] he is on the ASD, he is a Holiday representative and attends [employer]   Mental Status Exam: Appearance:   Casual     Behavior:  Appropriate  Motor:  Normal  Speech/Language:   NA  Affect:  Appropriate  Mood:  normal  Thought process:  normal  Thought content:    WNL  Sensory/Perceptual disturbances:    WNL  Orientation:  oriented to person, place, time/date, situation, and day of week  Attention:  Good  Concentration:  Good  Memory:  WNL  Fund of knowledge:   Good  Insight:    Good  Judgment:   Good   Impulse Control:  Good    Individualized Treatment Plan                Strengths: bright, verbal, motivated, self aware, self advocate   Supports: spouse, family, friends, colleagues   Goal/Needs for Treatment:  In order of importance to patient  1) Develop healthy interpersonal relationships that lead to the alleviation and help prevent the relapse of depression.  2) Learn and implement coping skills that result in a reduction of anxiety and worry, and improved functioning.   3) Combine skills learned in therapy into a new daily approach to managing ADHD.   Client Statement of Needs: requests assistance with piecing back together her life as it once was before she got depressed (burnt out).   Treatment Level: Weekly Individual Outpatient Psychotherapy.  Symptoms: autonomic hyperactivity (e.g., palpitations, shortness of breath, dry mouth, trouble swallowing, nausea, diarrhea).  Childhood history of Attention Deficit Disorder (ADD) that was either diagnosed or later concluded due to the symptoms of behavioral problems at school, impulsivity, temper outbursts, and lack of concentration.  Depressed or irritable mood. Diminished interest in or enjoyment of activities. Easily distracted and drawn from task at hand.  Excessive and/or unrealistic worry that is difficult to control occurring more days than not for at least 6 months about a number of events or activities.  Feelings of hopelessness, worthlessness, or inappropriate guilt.  Lack  of energy.   Poor concentration and indecisiveness.  Restless and fidgety; unable to be sedentary for more than a short time.   Client Treatment Preferences: Continue with current therapist   Healthcare consumer's goal for treatment:  Psychologist, Olivia [name], Ph.D. will support the patient's ability to achieve the goals identified. Cognitive Behavioral Therapy, Dialectical Behavioral Therapy, Motivational Interviewing, SPACE, parent training,  and other evidenced-based practices will be used to promote progress towards healthy functioning.   Healthcare consumer Olivia [name] will: Actively participate in therapy, working towards healthy functioning.    *Justification for Continuation/Discontinuation of Goal: R=Revised, O=Ongoing, A=Achieved, D=Discontinued  Goal 1) Develop healthy interpersonal relationships that lead to the alleviation and help prevent the relapse of depression.  5 Point Likert rating baseline date: 05/28/2021 Target Date Goal Was reviewed Status Code Progress towards goal/Likert rating  05/29/2023 05/28/2022         O 3/5 - learning to set better limits and boundaries, still hasn't been able to regulate her daily routines  05/25/2024 05/26/2023         O        3.75 - pt reestablished friendship but has not be consistent in reaching out, has improved her relationship with her parents with appropriate limits.  Is more accepting of her relationship with her estranged daughter as healthy for her and not a failure        Goal 2) Learn and implement coping skills that result in a reduction of anxiety and worry, and improved daily functioning.  5 Point Likert rating baseline date: 05/28/2021  Target Date Goal Was reviewed Status Code Progress towards goal/Likert rating  05/29/2023 05/28/2022          O 2/5 - learning to use her skills more consistently  05/25/2024 05/26/2023          O 3.5 - pt is learning to use her skills and better manage her unrealistic and perfectionist tendencies that keep her stressed and anxious        Goal 3) Combine skills learned in therapy into a new daily approach to managing ADHD.  5 Point Likert rating baseline date: 05/28/2021  Target Date Goal Was reviewed Status Code Progress towards goal/Likert rating  05/29/2023 05/28/2022          O 2/5 - learning to use her skills more consistently  05/25/2024 05/26/2023          O 3.75 - accepted the need and began medication, has been more aware,  recognize, acknowledge and accept the issues related ADHD and is better able to apply strategies. Pt is kinder to herself around her "limitations" or need for an alternative way of approaching tasks        This plan has been reviewed and created by the following participants:  This plan will be reviewed at least every 12 months. Date Behavioral Olivia Clinician Date Guardian/Patient   05/28/2022 Olivia [name], Ph.D.   05/28/2022 Olivia [name]  05/26/2023 Olivia [name], Ph.D.  05/26/2023 Olivia [name]              Diagnosis: Major Depressive Disorder, recurrent, mild Generalized Anxiety Disorder Attention Deficit Disorder, predominantly inattentive   Olivia [name] reports that she spoke to her mother and invited her over to help with a project.  We d/p how it was for her to manage her mother, talking to her father about her insights, and disappointment with her sister's disinterest.  Olivia [name] was please to share that  she did a few things she hasn't been motivated to do in a long time.  I provided support and encouragement around these accomplishments.  Home Practice:  set up family meeting to d/ household responsibilities  Olivia Favors, PhD  Son: Olivia [name] (17) he is on the ASD Olivia [name]  Olivia [name]

## 2024-02-12 ENCOUNTER — Other Ambulatory Visit: Payer: Self-pay

## 2024-02-12 NOTE — Progress Notes (Signed)
 Specialty Pharmacy Refill Coordination Note  Olivia [name] is a 58 y.o. female contacted today regarding refills of specialty medication(s) Ixekizumab Altamease Oiler)   Patient requested (Patient-Rptd) Delivery   Delivery date: (Patient-Rptd) 02/18/24   Verified address: (Patient-Rptd) [address] [place] [address]   Medication will be filled on 02/17/24.

## 2024-02-16 ENCOUNTER — Ambulatory Visit (INDEPENDENT_AMBULATORY_CARE_PROVIDER_SITE_OTHER): Payer: 59 | Admitting: Psychology

## 2024-02-16 DIAGNOSIS — F411 Generalized anxiety disorder: Secondary | ICD-10-CM

## 2024-02-16 NOTE — Progress Notes (Unsigned)
 PROGRESS NOTE:    Name: Olivia [name] Date: 02/16/2024 MRN: [id number] DOB: 1965-12-07 PCP: Willow Ora, MD  Time spent: 8:00 AM - 8:58 AM  Annual Review: 05/25/2024  Today I met with  Olivia [name] in remote video (Caregility) face-to-face individual psychotherapy.  Distance Site: [name]: Dr Odette Horns Remote Office Consent: Obtained verbal consent to transmit session remotely.   Patient is aware of the limitations inherent in participating in virtual therapy.   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 She is normally a high functioning and driven individual and it is a struggle to get motivated to do anything. If it is necessary for work she does it and does a good job, but it is not the same. She has gained 70 pounds in less than 9 months and it has impacted her self image. She is also peri menopausal and these hormonal changes are also likely to be contributing to her changing mood states.   A year and a half ago her father had quadrupel by-pass surgery and he was recently diagnosed with cancer. 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  Olivia [name] states that she was diagnosed with adult ADHD and responded well to medications.  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)   [name] he is on the ASD, he is a Holiday representative and attends [employer]   Mental Status Exam: Appearance:   Casual     Behavior:  Appropriate  Motor:  Normal  Speech/Language:   NA  Affect:  Appropriate  Mood:  normal  Thought process:  normal  Thought content:    WNL  Sensory/Perceptual disturbances:    WNL  Orientation:  oriented to person, place, time/date, situation, and day of week  Attention:  Good  Concentration:  Good  Memory:  WNL  Fund of knowledge:   Good  Insight:    Good  Judgment:   Good   Impulse Control:  Good    Individualized Treatment Plan                Strengths: bright, verbal, motivated, self aware, self advocate   Supports: spouse, family, friends, colleagues   Goal/Needs for Treatment:  In order of importance to patient  1) Develop healthy interpersonal relationships that lead to the alleviation and help prevent the relapse of depression.  2) Learn and implement coping skills that result in a reduction of anxiety and worry, and improved functioning.   3) Combine skills learned in therapy into a new daily approach to managing ADHD.   Client Statement of Needs: requests assistance with piecing back together her life as it once was before she got depressed (burnt out).   Treatment Level: Weekly Individual Outpatient Psychotherapy.  Symptoms: autonomic hyperactivity (e.g., palpitations, shortness of breath, dry mouth, trouble swallowing, nausea, diarrhea).  Childhood history of Attention Deficit Disorder (ADD) that was either diagnosed or later concluded due to the symptoms of behavioral problems at school, impulsivity, temper outbursts, and lack of concentration.  Depressed or irritable mood. Diminished interest in or enjoyment of activities. Easily distracted and drawn from task at hand.  Excessive and/or unrealistic worry that is difficult to control occurring more days than not for at least 6 months about a number of events or activities.  Feelings of hopelessness, worthlessness, or inappropriate guilt.  Lack  of energy.   Poor concentration and indecisiveness.  Restless and fidgety; unable to be sedentary for more than a short time.   Client Treatment Preferences: Continue with current therapist   Healthcare consumer's goal for treatment:  Psychologist, Olivia [name], Ph.D. will support the patient's ability to achieve the goals identified. Cognitive Behavioral Therapy, Dialectical Behavioral Therapy, Motivational Interviewing, SPACE, parent training,  and other evidenced-based practices will be used to promote progress towards healthy functioning.   Healthcare consumer Olivia [name] will: Actively participate in therapy, working towards healthy functioning.    *Justification for Continuation/Discontinuation of Goal: R=Revised, O=Ongoing, A=Achieved, D=Discontinued  Goal 1) Develop healthy interpersonal relationships that lead to the alleviation and help prevent the relapse of depression.  5 Point Likert rating baseline date: 05/28/2021 Target Date Goal Was reviewed Status Code Progress towards goal/Likert rating  05/29/2023 05/28/2022         O 3/5 - learning to set better limits and boundaries, still hasn't been able to regulate her daily routines  05/25/2024 05/26/2023         O        3.75 - pt reestablished friendship but has not be consistent in reaching out, has improved her relationship with her parents with appropriate limits.  Is more accepting of her relationship with her estranged daughter as healthy for her and not a failure        Goal 2) Learn and implement coping skills that result in a reduction of anxiety and worry, and improved daily functioning.  5 Point Likert rating baseline date: 05/28/2021  Target Date Goal Was reviewed Status Code Progress towards goal/Likert rating  05/29/2023 05/28/2022          O 2/5 - learning to use her skills more consistently  05/25/2024 05/26/2023          O 3.5 - pt is learning to use her skills and better manage her unrealistic and perfectionist tendencies that keep her stressed and anxious        Goal 3) Combine skills learned in therapy into a new daily approach to managing ADHD.  5 Point Likert rating baseline date: 05/28/2021  Target Date Goal Was reviewed Status Code Progress towards goal/Likert rating  05/29/2023 05/28/2022          O 2/5 - learning to use her skills more consistently  05/25/2024 05/26/2023          O 3.75 - accepted the need and began medication, has been more aware,  recognize, acknowledge and accept the issues related ADHD and is better able to apply strategies. Pt is kinder to herself around her "limitations" or need for an alternative way of approaching tasks        This plan has been reviewed and created by the following participants:  This plan will be reviewed at least every 12 months. Date Behavioral Olivia Clinician Date Guardian/Patient   05/28/2022 Olivia [name], Ph.D.   05/28/2022 Olivia [name]  05/26/2023 Olivia [name], Ph.D.  05/26/2023 Olivia [name]              Diagnosis: Major Depressive Disorder, recurrent, mild Generalized Anxiety Disorder Attention Deficit Disorder, predominantly inattentive   Olivia [name] reports that she has become a "princess" about her sleep.  We d/e/p what a difference it makes when she doesn't sleep well, changes in her ability to accomplish tasks, and her mood.  We d/ that her husband is finally buying into making changes that will support  her good sleep patterns.  Lastly, we d/p and p/s recent events, situations and concerns about her mother.      Home Practice:  set up family meeting to d/ household responsibilities  Olivia Greening, Olivia [name]  Son: Olivia [name] (17) he is on the ASD Olivia [name]  Olivia [name]

## 2024-02-17 ENCOUNTER — Other Ambulatory Visit: Payer: Self-pay

## 2024-02-23 ENCOUNTER — Ambulatory Visit: Payer: 59 | Admitting: Psychology

## 2024-02-23 ENCOUNTER — Other Ambulatory Visit (HOSPITAL_BASED_OUTPATIENT_CLINIC_OR_DEPARTMENT_OTHER): Payer: Self-pay

## 2024-02-23 MED ORDER — LIOTHYRONINE SODIUM 25 MCG PO TABS
37.5000 ug | ORAL_TABLET | Freq: Every day | ORAL | 2 refills | Status: DC
  Filled 2024-02-23: qty 140, 90d supply, fill #0

## 2024-02-26 ENCOUNTER — Ambulatory Visit: Admitting: Psychology

## 2024-02-26 DIAGNOSIS — F320 Major depressive disorder, single episode, mild: Secondary | ICD-10-CM

## 2024-02-26 DIAGNOSIS — F330 Major depressive disorder, recurrent, mild: Secondary | ICD-10-CM

## 2024-02-26 DIAGNOSIS — F411 Generalized anxiety disorder: Secondary | ICD-10-CM | POA: Diagnosis not present

## 2024-02-26 DIAGNOSIS — F900 Attention-deficit hyperactivity disorder, predominantly inattentive type: Secondary | ICD-10-CM

## 2024-02-26 NOTE — Progress Notes (Signed)
 PROGRESS NOTE:    Name: Olivia [name] Date: 02/26/2024 MRN: [id number] DOB: 1966-07-14 PCP: Luevenia Saha, MD  Time spent: 8:00 AM - 8:58 AM  Annual Review: 05/25/2024  Today I met with  Olivia [name] in remote video (Caregility) face-to-face individual psychotherapy.  Distance Site: [name]: Dr Durand Gift Remote Office Consent: Obtained verbal consent to transmit session remotely.   Patient is aware of the limitations inherent in participating in virtual therapy.   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 She is normally a high functioning and driven individual and it is a struggle to get motivated to do anything. If it is necessary for work she does it and does a good job, but it is not the same. She has gained 70 pounds in less than 9 months and it has impacted her self image. She is also peri menopausal and these hormonal changes are also likely to be contributing to her changing mood states.   A year and a half ago her [name] had quadrupel by-pass surgery and he was recently diagnosed with cancer. 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  Olivia [name] states that she was diagnosed with adult ADHD and responded well to medications.  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)   [name] he is on the ASD, he is a Holiday representative and attends [employer]   Mental Status Exam: Appearance:   Casual     Behavior:  Appropriate  Motor:  Normal  Speech/Language:   NA  Affect:  Appropriate  Mood:  normal  Thought process:  normal  Thought content:    WNL  Sensory/Perceptual disturbances:    WNL  Orientation:  oriented to person, place, time/date, situation, and day of week  Attention:  Good  Concentration:  Good  Memory:  WNL  Fund of knowledge:   Good  Insight:    Good  Judgment:   Good   Impulse Control:  Good    Individualized Treatment Plan                Strengths: bright, verbal, motivated, self aware, self advocate   Supports: spouse, family, friends, colleagues   Goal/Needs for Treatment:  In order of importance to patient  1) Develop healthy interpersonal relationships that lead to the alleviation and help prevent the relapse of depression.  2) Learn and implement coping skills that result in a reduction of anxiety and worry, and improved functioning.   3) Combine skills learned in therapy into a new daily approach to managing ADHD.   Client Statement of Needs: requests assistance with piecing back together her life as it once was before she got depressed (burnt out).   Treatment Level: Weekly Individual Outpatient Psychotherapy.  Symptoms: autonomic hyperactivity (e.g., palpitations, shortness of breath, dry mouth, trouble swallowing, nausea, diarrhea).  Childhood history of Attention Deficit Disorder (ADD) that was either diagnosed or later concluded due to the symptoms of behavioral problems at school, impulsivity, temper outbursts, and lack of concentration.  Depressed or irritable mood. Diminished interest in or enjoyment of activities. Easily distracted and drawn from task at hand.  Excessive and/or unrealistic worry that is difficult to control occurring more days than not for at least 6 months about a number of events or activities.  Feelings of hopelessness, worthlessness, or inappropriate guilt.  Lack  of energy.   Poor concentration and indecisiveness.  Restless and fidgety; unable to be sedentary for more than a short time.   Client Treatment Preferences: Continue with current therapist   Healthcare consumer's goal for treatment:  Psychologist, Olivia [name], Ph.D. will support the patient's ability to achieve the goals identified. Cognitive Behavioral Therapy, Dialectical Behavioral Therapy, Motivational Interviewing, SPACE, parent training,  and other evidenced-based practices will be used to promote progress towards healthy functioning.   Healthcare consumer Olivia [name] will: Actively participate in therapy, working towards healthy functioning.    *Justification for Continuation/Discontinuation of Goal: R=Revised, O=Ongoing, A=Achieved, D=Discontinued  Goal 1) Develop healthy interpersonal relationships that lead to the alleviation and help prevent the relapse of depression.  5 Point Likert rating baseline date: 05/28/2021 Target Date Goal Was reviewed Status Code Progress towards goal/Likert rating  05/29/2023 05/28/2022         O 3/5 - learning to set better limits and boundaries, still hasn't been able to regulate her daily routines  05/25/2024 05/26/2023         O        3.75 - pt reestablished friendship but has not be consistent in reaching out, has improved her relationship with her parents with appropriate limits.  Is more accepting of her relationship with her estranged daughter as healthy for her and not a failure        Goal 2) Learn and implement coping skills that result in a reduction of anxiety and worry, and improved daily functioning.  5 Point Likert rating baseline date: 05/28/2021  Target Date Goal Was reviewed Status Code Progress towards goal/Likert rating  05/29/2023 05/28/2022          O 2/5 - learning to use her skills more consistently  05/25/2024 05/26/2023          O 3.5 - pt is learning to use her skills and better manage her unrealistic and perfectionist tendencies that keep her stressed and anxious        Goal 3) Combine skills learned in therapy into a new daily approach to managing ADHD.  5 Point Likert rating baseline date: 05/28/2021  Target Date Goal Was reviewed Status Code Progress towards goal/Likert rating  05/29/2023 05/28/2022          O 2/5 - learning to use her skills more consistently  05/25/2024 05/26/2023          O 3.75 - accepted the need and began medication, has been more aware,  recognize, acknowledge and accept the issues related ADHD and is better able to apply strategies. Pt is kinder to herself around her "limitations" or need for an alternative way of approaching tasks        This plan has been reviewed and created by the following participants:  This plan will be reviewed at least every 12 months. Date Behavioral Olivia Clinician Date Guardian/Patient   05/28/2022 Olivia [name], Ph.D.   05/28/2022 Olivia [name]  05/26/2023 Olivia [name], Ph.D.  05/26/2023 Olivia [name]              Diagnosis: Major Depressive Disorder, recurrent, mild Generalized Anxiety Disorder Attention Deficit Disorder, predominantly inattentive   Olivia [name] reports that she is almost back to her normal sleep, and is trying to not "beat herself up about not getting as much done."   Olivia [name] is frequently calling for help and she is feeling stressed.  We d/e/p what's been occurring, managing her feelings, and  managing the stress of aging parents with serious Olivia issues.  Lastly, we d/p her new system for getting her family involved in completing household projects.  Home Practice:  set up family meeting to d/ household responsibilities  Olivia Greening, Olivia [name]  Son: Flossie Hussar (17) he is on the ASD Friend - Amy  Super Ego - Ninette Basque

## 2024-03-01 ENCOUNTER — Ambulatory Visit (INDEPENDENT_AMBULATORY_CARE_PROVIDER_SITE_OTHER): Payer: 59 | Admitting: Psychology

## 2024-03-01 DIAGNOSIS — F411 Generalized anxiety disorder: Secondary | ICD-10-CM | POA: Diagnosis not present

## 2024-03-01 DIAGNOSIS — F330 Major depressive disorder, recurrent, mild: Secondary | ICD-10-CM

## 2024-03-01 DIAGNOSIS — F900 Attention-deficit hyperactivity disorder, predominantly inattentive type: Secondary | ICD-10-CM

## 2024-03-01 DIAGNOSIS — F320 Major depressive disorder, single episode, mild: Secondary | ICD-10-CM

## 2024-03-01 NOTE — Progress Notes (Signed)
 PROGRESS NOTE:    Name: Olivia [name] Date: 03/01/2024 MRN: [id number] DOB: 1966-02-10 PCP: Luevenia Saha, MD  Time spent: 8:00 AM - 8:58 AM  Annual Review: 05/25/2024  Today I met with  Alford Angus in remote video (Caregility) face-to-face individual psychotherapy.  Distance Site: [name]: Dr Durand Gift Remote Office Consent: Obtained verbal consent to transmit session remotely.   Patient is aware of the limitations inherent in participating in virtual therapy.   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 She is normally a high functioning and driven individual and it is a struggle to get motivated to do anything. If it is necessary for work she does it and does a good job, but it is not the same. She has gained 70 pounds in less than 9 months and it has impacted her self image. She is also peri menopausal and these hormonal changes are also likely to be contributing to her changing mood states.   A year and a half ago her father had quadrupel by-pass surgery and he was recently diagnosed with cancer. 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  Ohanna states that she was diagnosed with adult ADHD and responded well to medications.  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)   Son: Health visitor (15) he is on the ASD, he is a Holiday representative and attends [employer]   Mental Status Exam: Appearance:   Casual     Behavior:  Appropriate  Motor:  Normal  Speech/Language:   NA  Affect:  Appropriate  Mood:  normal  Thought process:  normal  Thought content:    WNL  Sensory/Perceptual disturbances:    WNL  Orientation:  oriented to person, place, time/date, situation, and day of week  Attention:  Good  Concentration:  Good  Memory:  WNL  Fund of knowledge:   Good  Insight:    Good  Judgment:   Good   Impulse Control:  Good    Individualized Treatment Plan                Strengths: bright, verbal, motivated, self aware, self advocate   Supports: spouse, family, friends, colleagues   Goal/Needs for Treatment:  In order of importance to patient  1) Develop healthy interpersonal relationships that lead to the alleviation and help prevent the relapse of depression.  2) Learn and implement coping skills that result in a reduction of anxiety and worry, and improved functioning.   3) Combine skills learned in therapy into a new daily approach to managing ADHD.   Client Statement of Needs: requests assistance with piecing back together her life as it once was before she got depressed (burnt out).   Treatment Level: Weekly Individual Outpatient Psychotherapy.  Symptoms: autonomic hyperactivity (e.g., palpitations, shortness of breath, dry mouth, trouble swallowing, nausea, diarrhea).  Childhood history of Attention Deficit Disorder (ADD) that was either diagnosed or later concluded due to the symptoms of behavioral problems at school, impulsivity, temper outbursts, and lack of concentration.  Depressed or irritable mood. Diminished interest in or enjoyment of activities. Easily distracted and drawn from task at hand.  Excessive and/or unrealistic worry that is difficult to control occurring more days than not for at least 6 months about a number of events or activities.  Feelings of hopelessness, worthlessness, or inappropriate guilt.  Lack  of energy.   Poor concentration and indecisiveness.  Restless and fidgety; unable to be sedentary for more than a short time.   Client Treatment Preferences: Continue with current therapist   Healthcare consumer's goal for treatment:  Psychologist, Elder Greening, Ph.D. will support the patient's ability to achieve the goals identified. Cognitive Behavioral Therapy, Dialectical Behavioral Therapy, Motivational Interviewing, SPACE, parent training,  and other evidenced-based practices will be used to promote progress towards healthy functioning.   Healthcare consumer Nikkia "[name]" Cashmore will: Actively participate in therapy, working towards healthy functioning.    *Justification for Continuation/Discontinuation of Goal: R=Revised, O=Ongoing, A=Achieved, D=Discontinued  Goal 1) Develop healthy interpersonal relationships that lead to the alleviation and help prevent the relapse of depression.  5 Point Likert rating baseline date: 05/28/2021 Target Date Goal Was reviewed Status Code Progress towards goal/Likert rating  05/29/2023 05/28/2022         O 3/5 - learning to set better limits and boundaries, still hasn't been able to regulate her daily routines  05/25/2024 05/26/2023         O        3.75 - pt reestablished friendship but has not be consistent in reaching out, has improved her relationship with her parents with appropriate limits.  Is more accepting of her relationship with her estranged daughter as healthy for her and not a failure        Goal 2) Learn and implement coping skills that result in a reduction of anxiety and worry, and improved daily functioning.  5 Point Likert rating baseline date: 05/28/2021  Target Date Goal Was reviewed Status Code Progress towards goal/Likert rating  05/29/2023 05/28/2022          O 2/5 - learning to use her skills more consistently  05/25/2024 05/26/2023          O 3.5 - pt is learning to use her skills and better manage her unrealistic and perfectionist tendencies that keep her stressed and anxious        Goal 3) Combine skills learned in therapy into a new daily approach to managing ADHD.  5 Point Likert rating baseline date: 05/28/2021  Target Date Goal Was reviewed Status Code Progress towards goal/Likert rating  05/29/2023 05/28/2022          O 2/5 - learning to use her skills more consistently  05/25/2024 05/26/2023          O 3.75 - accepted the need and began medication, has been more aware,  recognize, acknowledge and accept the issues related ADHD and is better able to apply strategies. Pt is kinder to herself around her "limitations" or need for an alternative way of approaching tasks        This plan has been reviewed and created by the following participants:  This plan will be reviewed at least every 12 months. Date Behavioral Health Clinician Date Guardian/Patient   05/28/2022 Elder Greening, Ph.D.   05/28/2022 Arvilla Laud" Pearly Bound  05/26/2023 Elder Greening, Ph.D.  05/26/2023 Bynum Cassis "[name]" [place] Bound              Diagnosis: Major Depressive Disorder, recurrent, mild Generalized Anxiety Disorder Attention Deficit Disorder, predominantly inattentive   [name] reports that she is still struggling to get her sleep normal.  We d/e what she is doing differently and possible contributing factors.  We d/ some changes to help improve initial sleep and support a decrease in cortisol levels.  I provided the support and guidance necessary  to assist in the improvement of Cathy's sleep as it greatly impact her day-to-day functioning.   Elder Greening, PhD  Son: Flossie Hussar (17) he is on the ASD Friend - Amy  Super Ego - Ninette Basque

## 2024-03-08 ENCOUNTER — Ambulatory Visit (INDEPENDENT_AMBULATORY_CARE_PROVIDER_SITE_OTHER): Payer: 59 | Admitting: Psychology

## 2024-03-08 DIAGNOSIS — Z600 Problems of adjustment to life-cycle transitions: Secondary | ICD-10-CM

## 2024-03-08 DIAGNOSIS — F900 Attention-deficit hyperactivity disorder, predominantly inattentive type: Secondary | ICD-10-CM

## 2024-03-08 DIAGNOSIS — F411 Generalized anxiety disorder: Secondary | ICD-10-CM | POA: Diagnosis not present

## 2024-03-08 DIAGNOSIS — F330 Major depressive disorder, recurrent, mild: Secondary | ICD-10-CM

## 2024-03-08 NOTE — Progress Notes (Signed)
 PROGRESS NOTE:    Name: Olivia [name] Date: 03/08/2024 MRN: [id number] DOB: 1966/06/11 PCP: Luevenia Saha, MD  Time spent: 8:00 AM - 8:59 AM  Annual Review: 05/25/2024  Today I met with  Olivia [name] in remote video (Caregility) face-to-face individual psychotherapy.  Distance Site: [name]: Dr Durand Gift Remote Office Consent: Obtained verbal consent to transmit session remotely.   Patient is aware of the limitations inherent in participating in virtual therapy.   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 She is normally a high functioning and driven individual and it is a struggle to get motivated to do anything. If it is necessary for work she does it and does a good job, but it is not the same. She has gained 70 pounds in less than 9 months and it has impacted her self image. She is also peri menopausal and these hormonal changes are also likely to be contributing to her changing mood [name].   A year and a half ago her father had quadrupel by-pass surgery and he was recently diagnosed with cancer. 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  Olivia [name] and responded well to medications.  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)   [name] he is on the ASD, he is a Holiday representative and attends [employer]   Mental Status Exam: Appearance:   Casual     Behavior:  Appropriate  Motor:  Normal  Speech/Language:   NA  Affect:  Appropriate  Mood:  normal  Thought process:  normal  Thought content:    WNL  Sensory/Perceptual disturbances:    WNL  Orientation:  oriented to person, place, time/date, situation, and day of week  Attention:  Good  Concentration:  Good  Memory:  WNL  Fund of knowledge:   Good  Insight:    Good  Judgment:   Good   Impulse Control:  Good    Individualized Treatment Plan                Strengths: bright, verbal, motivated, self aware, self advocate   Supports: spouse, family, friends, colleagues   Goal/Needs for Treatment:  In order of importance to patient  1) Develop healthy interpersonal relationships that lead to the alleviation and help prevent the relapse of depression.  2) Learn and implement coping skills that result in a reduction of anxiety and worry, and improved functioning.   3) Combine skills learned in therapy into a new daily approach to managing [name].   Client Statement of Needs: requests assistance with piecing back together her life as it once was before she got depressed (burnt out).   Treatment Level: Weekly Individual Outpatient Psychotherapy.  Symptoms: autonomic hyperactivity (e.g., palpitations, shortness of breath, dry mouth, trouble swallowing, nausea, diarrhea).  Childhood history of Attention Deficit Disorder (ADD) that was either diagnosed or later concluded due to the symptoms of behavioral problems at school, impulsivity, temper outbursts, and lack of concentration.  Depressed or irritable mood. Diminished interest in or enjoyment of activities. Easily distracted and drawn from task at hand.  Excessive and/or unrealistic worry that is difficult to control occurring more days than not for at least 6 months about a number of events or activities.  Feelings of hopelessness, worthlessness, or inappropriate guilt.  Lack  of energy.   Poor concentration and indecisiveness.  Restless and fidgety; unable to be sedentary for more than a short time.   Client Treatment Preferences: Continue with current therapist   Healthcare consumer's goal for treatment:  Psychologist, Olivia [name], Ph.D. will support the patient's ability to achieve the goals identified. Cognitive Behavioral Therapy, Dialectical Behavioral Therapy, Motivational Interviewing, SPACE, parent training,  and other evidenced-based practices will be used to promote progress towards healthy functioning.   Healthcare consumer Olivia [name] will: Actively participate in therapy, working towards healthy functioning.    *Justification for Continuation/Discontinuation of Goal: R=Revised, O=Ongoing, A=Achieved, D=Discontinued  Goal 1) Develop healthy interpersonal relationships that lead to the alleviation and help prevent the relapse of depression.  5 Point Likert rating baseline date: 05/28/2021 Target Date Goal Was reviewed Status Code Progress towards goal/Likert rating  05/29/2023 05/28/2022         O 3/5 - learning to set better limits and boundaries, still hasn't been able to regulate her daily routines  05/25/2024 05/26/2023         O        3.75 - pt reestablished friendship but has not be consistent in reaching out, has improved her relationship with her parents with appropriate limits.  Is more accepting of her relationship with her estranged daughter as healthy for her and not a failure        Goal 2) Learn and implement coping skills that result in a reduction of anxiety and worry, and improved daily functioning.  5 Point Likert rating baseline date: 05/28/2021  Target Date Goal Was reviewed Status Code Progress towards goal/Likert rating  05/29/2023 05/28/2022          O 2/5 - learning to use her skills more consistently  05/25/2024 05/26/2023          O 3.5 - pt is learning to use her skills and better manage her unrealistic and perfectionist tendencies that keep her stressed and anxious        Goal 3) Combine skills learned in therapy into a new daily approach to managing [name].  5 Point Likert rating baseline date: 05/28/2021  Target Date Goal Was reviewed Status Code Progress towards goal/Likert rating  05/29/2023 05/28/2022          O 2/5 - learning to use her skills more consistently  05/25/2024 05/26/2023          O 3.75 - accepted the need and began medication, has been more aware,  recognize, acknowledge and accept the issues related [name] and is better able to apply strategies. Pt is kinder to herself around her "limitations" or need for an alternative way of approaching tasks        This plan has been reviewed and created by the following participants:  This plan will be reviewed at least every 12 months. Date Behavioral Olivia Clinician Date Guardian/Patient   05/28/2022 Olivia [name], Ph.D.   05/28/2022 Arvilla Laud" Pearly Bound  05/26/2023 Olivia [name], Ph.D.  05/26/2023 Bynum Cassis "Olivia [name]" [place] Bound              Diagnosis: Major Depressive Disorder, recurrent, mild Generalized Anxiety Disorder Attention Deficit Disorder, predominantly inattentive   Olivia [name] reports that she is still struggling to get her sleep normal, but she is making progress.  She shared that she spent her birthday at the [facility].  She shared what occurred, was left wondering why she "must organize chaos," and we made some very important connections between  the past and the present.  Olivia [name] also shared that she was upset again that her daughter isn't talking to her and admitted that she has still been paying for her school loans and is beginning to feel taken advantage of.  We d/ what is fair, the importance of keeping ones agreements, and where the differences lays.  By the end of the session, Olivia [name] had great insight in to her personality, why she does the things she does, and had an idea for how to proceed with her daughter's school loans.  Olivia Greening, PhD  Son: Flossie Hussar (17) he is on the ASD Friend - Amy  Super Ego - Ninette Basque

## 2024-03-09 ENCOUNTER — Encounter: Payer: Self-pay | Admitting: Family Medicine

## 2024-03-09 ENCOUNTER — Ambulatory Visit: Admitting: Family Medicine

## 2024-03-09 VITALS — BP 126/67 | HR 104 | Temp 98.2°F | Ht 69.0 in | Wt 328.0 lb

## 2024-03-09 DIAGNOSIS — E282 Polycystic ovarian syndrome: Secondary | ICD-10-CM

## 2024-03-09 DIAGNOSIS — Z1211 Encounter for screening for malignant neoplasm of colon: Secondary | ICD-10-CM

## 2024-03-09 DIAGNOSIS — E890 Postprocedural hypothyroidism: Secondary | ICD-10-CM | POA: Diagnosis not present

## 2024-03-09 DIAGNOSIS — R931 Abnormal findings on diagnostic imaging of heart and coronary circulation: Secondary | ICD-10-CM

## 2024-03-09 DIAGNOSIS — F339 Major depressive disorder, recurrent, unspecified: Secondary | ICD-10-CM | POA: Diagnosis not present

## 2024-03-09 DIAGNOSIS — Z23 Encounter for immunization: Secondary | ICD-10-CM | POA: Diagnosis not present

## 2024-03-09 DIAGNOSIS — Z8639 Personal history of other endocrine, nutritional and metabolic disease: Secondary | ICD-10-CM

## 2024-03-09 DIAGNOSIS — Z9884 Bariatric surgery status: Secondary | ICD-10-CM | POA: Diagnosis not present

## 2024-03-09 DIAGNOSIS — Z0001 Encounter for general adult medical examination with abnormal findings: Secondary | ICD-10-CM

## 2024-03-09 DIAGNOSIS — E782 Mixed hyperlipidemia: Secondary | ICD-10-CM | POA: Diagnosis not present

## 2024-03-09 LAB — COMPREHENSIVE METABOLIC PANEL WITH GFR
ALT: 16 U/L (ref 0–35)
AST: 19 U/L (ref 0–37)
Albumin: 4.3 g/dL (ref 3.5–5.2)
Alkaline Phosphatase: 57 U/L (ref 39–117)
BUN: 10 mg/dL (ref 6–23)
CO2: 25 meq/L (ref 19–32)
Calcium: 9.8 mg/dL (ref 8.4–10.5)
Chloride: 104 meq/L (ref 96–112)
Creatinine, Ser: 0.81 mg/dL (ref 0.40–1.20)
GFR: 81.39 mL/min (ref >60.00)
Glucose, Bld: 95 mg/dL (ref 70–99)
Potassium: 4.1 meq/L (ref 3.5–5.1)
Sodium: 138 meq/L (ref 135–145)
Total Bilirubin: 0.7 mg/dL (ref 0.2–1.2)
Total Protein: 7.2 g/dL (ref 6.0–8.3)

## 2024-03-09 LAB — LIPID PANEL
Cholesterol: 201 mg/dL — ABNORMAL HIGH (ref 0–200)
HDL: 62 mg/dL (ref >39.00)
LDL Cholesterol: 122 mg/dL — ABNORMAL HIGH (ref 0–99)
NonHDL: 139.05
Total CHOL/HDL Ratio: 3
Triglycerides: 84 mg/dL (ref 0.0–149.0)
VLDL: 16.8 mg/dL (ref 0.0–40.0)

## 2024-03-09 LAB — CBC WITH DIFFERENTIAL/PLATELET
Basophils Absolute: 0.1 10*3/uL (ref 0.0–0.1)
Basophils Relative: 0.9 % (ref 0.0–3.0)
Eosinophils Absolute: 0.2 10*3/uL (ref 0.0–0.7)
Eosinophils Relative: 2.4 % (ref 0.0–5.0)
HCT: 46.7 % — ABNORMAL HIGH (ref 36.0–46.0)
Hemoglobin: 15.5 g/dL — ABNORMAL HIGH (ref 12.0–15.0)
Lymphocytes Relative: 32.1 % (ref 12.0–46.0)
Lymphs Abs: 2.5 10*3/uL (ref 0.7–4.0)
MCHC: 33.2 g/dL (ref 30.0–36.0)
MCV: 82.5 fl (ref 78.0–100.0)
Monocytes Absolute: 0.5 10*3/uL (ref 0.1–1.0)
Monocytes Relative: 6.7 % (ref 3.0–12.0)
Neutro Abs: 4.5 10*3/uL (ref 1.4–7.7)
Neutrophils Relative %: 57.9 % (ref 43.0–77.0)
Platelets: 292 10*3/uL (ref 150.0–400.0)
RBC: 5.66 Mil/uL — ABNORMAL HIGH (ref 3.87–5.11)
RDW: 13.3 % (ref 11.5–15.5)
WBC: 7.7 10*3/uL (ref 4.0–10.5)

## 2024-03-09 LAB — HEMOGLOBIN A1C: Hgb A1c MFr Bld: 5.5 % (ref 4.6–6.5)

## 2024-03-09 LAB — TSH: TSH: 0 u[IU]/mL — ABNORMAL LOW (ref 0.35–5.50)

## 2024-03-09 LAB — T4, FREE: Free T4: 0.98 ng/dL (ref 0.60–1.60)

## 2024-03-09 NOTE — Patient Instructions (Addendum)
 Please return in 3 months for recheck/weight check  I will release your lab results to you on your MyChart account with further instructions. You may see the results before I do, but when I review them I will send you a message with my report or have my assistant call you if things need to be discussed. Please reply to my message with any questions. Thank you!   If you have any questions or concerns, please don't hesitate to send me a message via MyChart or call the office at [phone]. Thank you for visiting with us  today! It's our pleasure caring for you.    VISIT SUMMARY: Today, you came in for a follow-up visit to discuss several ongoing health issues, including hypothyroidism, PTSD, psoriasis, hyperlipidemia, weight management, and suspected sleep apnea. We reviewed your current treatments and discussed plans for further management and testing.  YOUR PLAN: -OBESITY: Obesity means having an excessive amount of body fat. We discussed the need for weight loss to undergo a colonoscopy. We considered GLP-1 agonists for weight management, but cost and insurance are barriers. We will explore options for acquiring GLP-1 agonists and schedule an appointment with a pulmonologist to evaluate for sleep apnea, which may help with GLP-1 coverage.  -SLEEP APNEA (SUSPECTED): Sleep apnea is a condition where breathing repeatedly stops and starts during sleep. It is suspected due to your elevated hemoglobin levels. We will schedule an in-home sleep study and consult a pulmonologist for further evaluation.  -HYPERLIPIDEMIA: Hyperlipidemia means having high levels of fats (lipids) in your blood. You have not been taking your rosuvastatin  as prescribed. Please resume taking rosuvastatin  and incorporate it into your daily medication routine.  -HYPOTHYROIDISM: Hypothyroidism is a condition where your thyroid  gland doesn't produce enough thyroid  hormones. You are currently experiencing symptoms like dry skin, hair  loss, and brain fog. We will order tests to check your TSH, T4, and reverse T3 levels and review the results together.  -DEPRESSION: Depression is a mood disorder that causes persistent feelings of sadness and loss of interest. You are managing it with Wellbutrin  and escitalopram  and attending weekly therapy sessions. Please continue with your current medications and therapy.  -PSORIASIS: Psoriasis is a skin condition that causes red, itchy, scaly patches. You are managing it with Taltz . Continue with your current treatment.  INSTRUCTIONS: 1. Schedule an appointment with a pulmonologist for a sleep apnea evaluation. 2. Schedule an in-home sleep study. 3. Resume taking rosuvastatin  and incorporate it into your daily medication routine. 4. Get lab tests done for TSH, T4, and reverse T3 levels. 5. Continue with your current medications for depression and attend weekly therapy sessions. 6. Continue using Taltz  for psoriasis management.                      Contains text generated by [employer]bridge.                                 Contains text generated by [employer].

## 2024-03-09 NOTE — Progress Notes (Signed)
 Subjective  Chief Complaint  Patient presents with   Annual Exam    Pt here for Annual Exam and is currently fasting    Hypothyroidism    HPI: Olivia [name] is a 58 y.o. female who presents to [facility] [place] today for a Female Wellness Visit. She also has the concerns and/or needs as listed above in the chief complaint. These will be addressed in addition to the Health Maintenance Visit.   Wellness Visit: annual visit with health maintenance review and exam  HM:  overdue for colonoscopy: last 2018 with Dr. Tova Fresh, no polyps but 3yr recall for + FH of colon cancer. Pt to schedule. Mamm and pap are current. Imms:eligible for prevnar 20 due to h/o asthma. (Mild intermittent, allergic) Chronic disease f/u and/or acute problem visit: (deemed necessary to be done in addition to the wellness visit): Discussed the use of AI scribe software for clinical note transcription with the patient, who gave verbal consent to proceed.  History of Present Illness Olivia [name] "Olivia [name]" is a 58 year old female who presents for a follow-up visit.  She has a history of hypothyroidism, currently managed with levothyroxine  and LEO. She experiences symptoms such as dry skin, hair loss, and brain fog. Her TSH is reportedly normal, but she produces a high amount of reverse T3, which binds to T3 receptors, causing her to function as if she has hypothyroidism. She is due for lab work to check her TSH, T4, and reverse T3 levels.  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 She is currently seeing a therapist weekly and is on Wellbutrin  and escitalopram  for her mental health. Her mental health is significantly impacted by her sleep patterns, with better mental health on weeks when she sleeps well. Overall she is significantly improved and functioning at a much better level. Working on home life/work life balance. Has a lot to do as  primary caregiver for both parents.   She has a history of psoriasis and is on Taltz  for management. She also mentions a high C-reactive protein level and is concerned about inflammation.  She has not been taking her rosuvastatin  as prescribed due to forgetting to include it in her medication routine. She plans to resume taking it regularly. Has been off x 2 months. No side effects or concerns.   She is concerned about her weight and mentions a goal to lose weight to undergo a colonoscopy, which is overdue. She has a family history of colon cancer, with her mother having had the disease, and she has been undergoing colonoscopies every five years since age 29. She is eating better and exercising. Has h/o gastric bypass. Taking vitamins. Has h/o PCOS and possible sleep apnea. Would be good glp-1 candidate.   She reports a history of sleep apnea and is considering a sleep study. She has not yet been contacted for an in-home sleep study, which she believes is necessary due to elevated hemoglobin levels.  She is also managing her parents' health issues, which has impacted her ability to care for herself. Her father has a history of coronary artery bypass grafting, prostate cancer, and PTSD, while her mother has cognitive impairment and diabetes. She is actively involved in their care, which includes managing her father's therapy and support group participation.   Assessment  1. Encounter for well adult exam with abnormal findings   2. Post-surgical hypothyroidism  3. History of hyperparathyroidism   4. Mixed hyperlipidemia   5. Morbid obesity (HCC)   6. Major depression, recurrent, chronic (HCC)   7. History of laparoscopic adjustable gastric banding   8. Elevated coronary artery calcium  score, 90th percentile for age and gender   9. PCOS (polycystic ovarian syndrome)   10. Screening for colorectal cancer   11. Need for pneumococcal 20-valent conjugate vaccination      Plan  Female Wellness  Visit: Age appropriate Health Maintenance and Prevention measures were discussed with patient. Included topics are cancer screening recommendations, ways to keep healthy (see AVS) including dietary and exercise recommendations, regular eye and dental care, use of seat belts, and avoidance of moderate alcohol use and tobacco use.  Pt to schedule her colonoscopy. BMI: discussed patient's BMI and encouraged positive lifestyle modifications to help get to or maintain a target BMI. HM needs and immunizations were addressed and ordered. See below for orders. See HM and immunization section for updates. Prevnar 20 given today Routine labs and screening tests ordered including cmp, cbc and lipids where appropriate. Discussed recommendations regarding Vit D and calcium  supplementation (see AVS)  Chronic disease management visit and/or acute problem visit: Assessment and Plan Assessment & Plan Obesity Weight loss needed for colonoscopy and overall improvement in health and possible sleep apena and metabolic health to prevent development of diabetes. GLP-1 agonists considered but cost and insurance are barriers. Discussed benefits for weight loss and inflammation reduction. Exploring options for GLP-1 acquisition. - Consider GLP-1 agonists for weight management. - Schedule pulmonologist appointment for potential sleep apnea diagnosis to facilitate GLP-1 coverage.  Sleep apnea (suspected) Suspected due to elevated hemoglobin. Diagnosis may aid GLP-1 coverage. Discussed Zepbound for sleep apnea to support GLP-1 coverage. - Schedule in-home sleep study. - Consult pulmonologist for sleep apnea evaluation.  Hyperlipidemia Non-compliance with rosuvastatin  due to misplacement. Plan to resume medication. - Resume rosuvastatin . - Incorporate rosuvastatin  into daily medication routine.  Hypothyroidism Symptoms include dry skin, hair loss, brain fog. Current treatment with levothyroxine  and LEO. Plan to assess  thyroid  function with lab tests monitored by Dr. Lauralee Poll. - Order TSH, T4, and reverse T3 tests. - Review thyroid  function tests with her. - currently on levothyroxine  100 and cytomel  50, feels "low"  Depression Managed with Wellbutrin  and escitalopram . Reports improvement with therapy and medication adherence. Weekly therapy sessions continue, addressing PTSD. - Continue Wellbutrin  and escitalopram . - Continue weekly therapy sessions.  Psoriasis Managed with Taltz .   Follow up: 3 mo for weight recheck  Orders Placed This Encounter  Procedures   Pneumococcal conjugate vaccine 20-valent (Prevnar 20)   CBC with Differential/Platelet   Comprehensive metabolic panel with GFR   Lipid panel   Hemoglobin A1c   TSH   Parathyroid  hormone, intact (no Ca)   T3   T4, free   T3, reverse   No orders of the defined types were placed in this encounter.     Body mass index is 48.44 kg/m. Wt Readings from Last 3 Encounters:  03/09/24 (!) 328 lb (148.8 kg)  09/03/23 (!) 330 lb (149.7 kg)  03/27/23 (!) 343 lb 6.4 oz (155.8 kg)     Patient Active Problem List   Diagnosis Date Noted Date Diagnosed   History of total thyroidectomy 01/17/2021     Priority: High    Due to multiple nodules    Post-surgical hypothyroidism 01/17/2021     Priority: High    Thyroidectomy due to multiple nodules    Major depression, recurrent,  chronic (HCC) 11/17/2018     Priority: High   History of laparoscopic adjustable gastric banding 05/08/2012     Priority: High    CT 11/19: Small hiatal hernia. Distal circumferential esophageal wall thickening may reflect esophagitis. Lost the 100 pounds from the surgery and then gained it back.     Mixed hyperlipidemia 04/15/2008     Priority: High   Morbid obesity (HCC) 04/15/2008     Priority: High   History of parathyroid  surgery 2021 01/17/2021     Priority: Medium     For primary hyperparathyroidism. Dr. Sofia Dunn    History of hyperparathyroidism  05/22/2020     Priority: Medium    Ventricular bigeminy 11/17/2018     Priority: Medium     2019: cards eval;Holter, echo and ETT; intolerant to beta blockers    Atopic dermatitis 08/19/2018     Priority: Medium    Elevated coronary artery calcium  score, 90th percentile for age and gender 08/16/2018     Priority: Medium     IMPRESSION: 1. Coronary artery calcium  score 9.5 Agatston units. This places the patient in the 90th percentile for age and gender. This suggests high risk for future cardiac events. 2. This study is limited by artifact. However, there only appears tbe plaque in the proximal LAD. There is no more than mild stenosis.    Psoriasis 01/05/2018     Priority: Medium    Chronic low back pain with sciatica 01/05/2018     Priority: Medium    IUD (intrauterine device) in place, Liletta  12/07/2017     Priority: Medium    PCOS (polycystic ovarian syndrome) 10/25/2014     Priority: Medium    Migraine with aura 01/11/2014     Priority: Medium    Vitamin D  deficiency 01/05/2018     Priority: Low   Allergic rhinitis 04/15/2008     Priority: Low   Allergic asthma 04/15/2008     Priority: Low   Health Maintenance  Topic Date Due   Colonoscopy  09/29/2022   COVID-19 Vaccine (5 - 2024-25 season) 03/25/2024 (Originally 07/06/2023)   INFLUENZA VACCINE  06/04/2024   MAMMOGRAM  10/19/2024   Cervical Cancer Screening (HPV/Pap Cotest)  08/19/2027   DTaP/Tdap/Td (2 - Td or Tdap) 05/01/2030   Pneumococcal Vaccine 47-27 Years old  Completed   Hepatitis C Screening  Completed   HIV Screening  Completed   Zoster Vaccines- Shingrix  Completed   HPV VACCINES  Aged Out   Meningococcal B Vaccine  Aged Out   Immunization History  Administered Date(s) Administered   Influenza Inj Mdck Quad Pf 07/21/2020   Influenza, Quadrivalent, Recombinant, Inj, Pf 08/03/2019   Influenza, Seasonal, Injecte, Preservative Fre 09/03/2023   Influenza,inj,Quad PF,6+ Mos 08/07/2018, 07/21/2020    Influenza-Unspecified 10/25/2014, 08/15/2021   Moderna Covid-19 Vaccine Bivalent Booster 66yrs & up 03/21/2021   Moderna SARS-COV2 Booster Vaccination 03/29/2021   PFIZER(Purple Top)SARS-COV-2 Vaccination 10/27/2019, 11/17/2019, 08/18/2020   PNEUMOCOCCAL CONJUGATE-20 03/09/2024   Tdap 05/01/2020   Tetanus 04/04/1997, 11/05/2007   Zoster Recombinant(Shingrix) 05/22/2021, 11/06/2021   We updated and reviewed the patient's past history in detail and it is documented below. Allergies: Patient is allergic to cephalosporins, egg-derived products, penicillins, sulfa drugs cross reactors, adhesive [tape], and effexor  [venlafaxine ]. Past Medical History Patient  has a past medical history of Anxiety, Asthma, Environmental allergies, Hiatal hernia, Hiatal hernia without gangrene or obstruction (08/19/2018), History of parathyroid  surgery 2021 (01/17/2021), HLD (hyperlipidemia) (04/15/2008), Microcytic anemia, Migraine, hormonal, Multiple thyroid  nodules (08/19/2018), PAC (  premature atrial contraction), PACs (premature atrial contraction), Palpitations, PCOS (polycystic ovarian syndrome) (10/25/2014), PONV (postoperative nausea and vomiting), Pre-diabetes, Reflux, and Seasonal allergies. Past Surgical History Patient  has a past surgical history that includes Tonsillectomy (1981); Laparoscopic gastric banding (08/08/2008); Hiatal hernia repair (04/14/2012); Thyroidectomy (N/A, 06/02/2020); and Parathyroidectomy (Left, 06/02/2020). Family History: Patient family history includes Colon cancer in her mother; Colon polyps in her mother; Diabetes in her mother; Hypertension in her father; Obesity in her father, mother, and sister. Social History:  Patient  reports that she has never smoked. She has never used smokeless tobacco. She reports that she does not drink alcohol and does not use drugs.  Review of Systems: Constitutional: negative for fever or malaise Ophthalmic: negative for photophobia, double vision  or loss of vision Cardiovascular: negative for chest pain, dyspnea on exertion, or new LE swelling Respiratory: negative for SOB or persistent cough Gastrointestinal: negative for abdominal pain, change in bowel habits or melena Genitourinary: negative for dysuria or gross hematuria, no abnormal uterine bleeding or disharge Musculoskeletal: negative for new gait disturbance or muscular weakness Integumentary: negative for new or persistent rashes, no breast lumps Neurological: negative for TIA or stroke symptoms Psychiatric: negative for SI or delusions Allergic/Immunologic: negative for hives  Patient Care Team    Relationship Specialty Notifications Start End  Luevenia Saha, MD PCP - General Family Medicine  10/08/23   Manya Sells, MD Consulting Physician Neurosurgery  05/06/18   Prudy Brownie, DO (Inactive) Consulting Physician Pulmonary Disease  11/17/18   Arnoldo Lapping, MD Consulting Physician Cardiology  11/17/18   Drusilla Gerlach, MD Consulting Physician Dermatology  11/17/18   Audelia Leaks, MD Consulting Physician Obstetrics and Gynecology  11/17/18   Gordy Lauber, MD Consulting Physician Endocrinology  12/19/19   Tami Falcon, MD Consulting Physician Gastroenterology  03/09/24   Obgyn, Corbin Dess    03/09/24     Objective  Vitals: BP 126/67   Pulse (!) 104   Temp 98.2 F (36.8 C)   Ht 5\' 9"  (1.753 m)   Wt (!) 328 lb (148.8 kg)   SpO2 94%   BMI 48.44 kg/m  General:  Well developed, well nourished, no acute distress  Psych:  Alert and orientedx3,normal mood and affect HEENT:  Normocephalic, atraumatic, non-icteric sclera,  supple neck without adenopathy, mass or thyromegaly Cardiovascular:  Normal S1, S2, RRR without gallop, rub or murmur Respiratory:  Good breath sounds bilaterally, CTAB with normal respiratory effort Gastrointestinal: normal bowel sounds, soft, non-tender, no noted masses. No HSM MSK: extremities without edema, joints without erythema or  swelling Neurologic:    Mental status is normal.  Gross motor and sensory exams are normal.  No tremor  Commons side effects, risks, benefits, and alternatives for medications and treatment plan prescribed today were discussed, and the patient expressed understanding of the given instructions. Patient is instructed to call or message via MyChart if he/she has any questions or concerns regarding our treatment plan. No barriers to understanding were identified. We discussed Red Flag symptoms and signs in detail. Patient expressed understanding regarding what to do in case of urgent or emergency type symptoms.  Medication list was reconciled, printed and provided to the patient in AVS. Patient instructions and summary information was reviewed with the patient as documented in the AVS. This note was prepared with assistance of Dragon voice recognition software. Occasional wrong-word or sound-a-like substitutions may have occurred due to the inherent limitations of voice recognition software

## 2024-03-10 ENCOUNTER — Other Ambulatory Visit (HOSPITAL_COMMUNITY): Payer: Self-pay

## 2024-03-11 ENCOUNTER — Other Ambulatory Visit: Payer: Self-pay

## 2024-03-11 ENCOUNTER — Other Ambulatory Visit (HOSPITAL_COMMUNITY): Payer: Self-pay

## 2024-03-11 NOTE — Progress Notes (Signed)
 Specialty Pharmacy Refill Coordination Note  KARIANNE TOLLEFSEN is a 58 y.o. female contacted today regarding refills of specialty medication(s) Taltz .  Patient requested (Patient-Rptd) Delivery   Delivery date: (Patient-Rptd) 03/19/24   Verified address: (Patient-Rptd) [address] [place] [address]   Medication will be filled on 03/18/24.

## 2024-03-12 NOTE — Telephone Encounter (Signed)
 Noted.

## 2024-03-13 LAB — PARATHYROID HORMONE, INTACT (NO CA): PTH: 37 pg/mL (ref 16–77)

## 2024-03-13 LAB — T3: T3, Total: 181 ng/dL (ref 76–181)

## 2024-03-13 LAB — T3, REVERSE: T3, Reverse: 26 ng/dL — ABNORMAL HIGH (ref 8–25)

## 2024-03-15 ENCOUNTER — Ambulatory Visit (INDEPENDENT_AMBULATORY_CARE_PROVIDER_SITE_OTHER): Payer: 59 | Admitting: Psychology

## 2024-03-15 DIAGNOSIS — F900 Attention-deficit hyperactivity disorder, predominantly inattentive type: Secondary | ICD-10-CM

## 2024-03-15 DIAGNOSIS — F330 Major depressive disorder, recurrent, mild: Secondary | ICD-10-CM | POA: Diagnosis not present

## 2024-03-15 DIAGNOSIS — F411 Generalized anxiety disorder: Secondary | ICD-10-CM

## 2024-03-15 NOTE — Progress Notes (Signed)
 PROGRESS NOTE:    Name: Olivia [name] Date: 03/15/2024 MRN: [id number] DOB: April 11, 1966 PCP: Luevenia Saha, MD  Time spent: 8:00 AM - 8:59 AM  Annual Review: 05/25/2024  Today I met with  Olivia [name] in remote video (Caregility) face-to-face individual psychotherapy.  Distance Site: [name]: Dr Durand Gift Remote Office Consent: Obtained verbal consent to transmit session remotely.   Patient is aware of the limitations inherent in participating in virtual therapy.   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 She is normally a high functioning and driven individual and it is a struggle to get motivated to do anything. If it is necessary for work she does it and does a good job, but it is not the same. She has gained 70 pounds in less than 9 months and it has impacted her self image. She is also peri menopausal and these hormonal changes are also likely to be contributing to her changing mood states.   A year and a half ago her father had quadrupel by-pass surgery and he was recently diagnosed with cancer. 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  Olivia [name] states that she was diagnosed with adult ADHD and responded well to medications.  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)   Son: Health visitor (15) he is on the ASD, he is a Holiday representative and attends [employer]   Mental Status Exam: Appearance:   Casual     Behavior:  Appropriate  Motor:  Normal  Speech/Language:   NA  Affect:  Appropriate  Mood:  normal  Thought process:  normal  Thought content:    WNL  Sensory/Perceptual disturbances:    WNL  Orientation:  oriented to person, place, time/date, situation, and day of week  Attention:  Good  Concentration:  Good  Memory:  WNL  Fund of knowledge:   Good  Insight:    Good  Judgment:   Good   Impulse Control:  Good    Individualized Treatment Plan                Strengths: bright, verbal, motivated, self aware, self advocate   Supports: spouse, family, friends, colleagues   Goal/Needs for Treatment:  In order of importance to patient  1) Develop healthy interpersonal relationships that lead to the alleviation and help prevent the relapse of depression.  2) Learn and implement coping skills that result in a reduction of anxiety and worry, and improved functioning.   3) Combine skills learned in therapy into a new daily approach to managing ADHD.   Client Statement of Needs: requests assistance with piecing back together her life as it once was before she got depressed (burnt out).   Treatment Level: Weekly Individual Outpatient Psychotherapy.  Symptoms: autonomic hyperactivity (e.g., palpitations, shortness of breath, dry mouth, trouble swallowing, nausea, diarrhea).  Childhood history of Attention Deficit Disorder (ADD) that was either diagnosed or later concluded due to the symptoms of behavioral problems at school, impulsivity, temper outbursts, and lack of concentration.  Depressed or irritable mood. Diminished interest in or enjoyment of activities. Easily distracted and drawn from task at hand.  Excessive and/or unrealistic worry that is difficult to control occurring more days than not for at least 6 months about a number of events or activities.  Feelings of hopelessness, worthlessness, or inappropriate guilt.  Lack  of energy.   Poor concentration and indecisiveness.  Restless and fidgety; unable to be sedentary for more than a short time.   Client Treatment Preferences: Continue with current therapist   Healthcare consumer's goal for treatment:  Psychologist, Elder Greening, Ph.D. will support the patient's ability to achieve the goals identified. Cognitive Behavioral Therapy, Dialectical Behavioral Therapy, Motivational Interviewing, SPACE, parent training,  and other evidenced-based practices will be used to promote progress towards healthy functioning.   Healthcare consumer Olivia [name] will: Actively participate in therapy, working towards healthy functioning.    *Justification for Continuation/Discontinuation of Goal: R=Revised, O=Ongoing, A=Achieved, D=Discontinued  Goal 1) Develop healthy interpersonal relationships that lead to the alleviation and help prevent the relapse of depression.  5 Point Likert rating baseline date: 05/28/2021 Target Date Goal Was reviewed Status Code Progress towards goal/Likert rating  05/29/2023 05/28/2022         O 3/5 - learning to set better limits and boundaries, still hasn't been able to regulate her daily routines  05/25/2024 05/26/2023         O        3.75 - pt reestablished friendship but has not be consistent in reaching out, has improved her relationship with her parents with appropriate limits.  Is more accepting of her relationship with her estranged daughter as healthy for her and not a failure        Goal 2) Learn and implement coping skills that result in a reduction of anxiety and worry, and improved daily functioning.  5 Point Likert rating baseline date: 05/28/2021  Target Date Goal Was reviewed Status Code Progress towards goal/Likert rating  05/29/2023 05/28/2022          O 2/5 - learning to use her skills more consistently  05/25/2024 05/26/2023          O 3.5 - pt is learning to use her skills and better manage her unrealistic and perfectionist tendencies that keep her stressed and anxious        Goal 3) Combine skills learned in therapy into a new daily approach to managing ADHD.  5 Point Likert rating baseline date: 05/28/2021  Target Date Goal Was reviewed Status Code Progress towards goal/Likert rating  05/29/2023 05/28/2022          O 2/5 - learning to use her skills more consistently  05/25/2024 05/26/2023          O 3.75 - accepted the need and began medication, has been more aware,  recognize, acknowledge and accept the issues related ADHD and is better able to apply strategies. Pt is kinder to herself around her "limitations" or need for an alternative way of approaching tasks        This plan has been reviewed and created by the following participants:  This plan will be reviewed at least every 12 months. Date Behavioral Health Clinician Date Guardian/Patient   05/28/2022 Elder Greening, Ph.D.   05/28/2022 Olivia [name]  05/26/2023 Elder Greening, Ph.D.  05/26/2023 Olivia [name]              Diagnosis: Major Depressive Disorder, recurrent, mild Generalized Anxiety Disorder Attention Deficit Disorder, predominantly inattentive   Olivia [name] reports that her sleep was better this week and she was able to be very productive.  We d/e/p how better sleep impacted her day-to-day functioning.  We d/p an issues with her mother that is challenging her, trying to make a decision about serving on a working  board, and worries about getting overwhelmed by responsibilities.  We agreed on a method for checking in with herself daily so that she isn't "taken by surprise" and is then able to respond/change in a timely manner.   Elder Greening, PhD  Son: Olivia [name] (17) he is on the ASD Olivia [name] - Olivia [name]  Olivia [name] - Olivia [name]

## 2024-03-16 ENCOUNTER — Ambulatory Visit: Payer: Self-pay | Admitting: Family Medicine

## 2024-03-16 ENCOUNTER — Encounter: Payer: Self-pay | Admitting: Family Medicine

## 2024-03-16 DIAGNOSIS — E890 Postprocedural hypothyroidism: Secondary | ICD-10-CM

## 2024-03-16 MED ORDER — LIOTHYRONINE SODIUM 25 MCG PO TABS: 25.0000 ug | ORAL_TABLET | Freq: Every day | ORAL | Status: DC

## 2024-03-16 MED ORDER — ROSUVASTATIN CALCIUM 20 MG PO TABS
20.0000 mg | ORAL_TABLET | Freq: Every evening | ORAL | 3 refills | Status: AC
  Filled 2024-03-16 – 2024-04-14 (×2): qty 90, 90d supply, fill #0
  Filled 2024-08-15: qty 90, 90d supply, fill #1
  Filled 2024-11-22: qty 90, 90d supply, fill #2

## 2024-03-16 NOTE — Progress Notes (Signed)
 See mychart note Dear Olivia [name], I am finally replying! Sorry ... It's been crazy.  I agree; you are getting too much thyroid  hormone. We can cut back on the T3 to 25mcg but may also need to cut back on the T4. Please start the new dose; please schedule a lab visit in 6 weeks to recheck you levels.   Good news: your other lab results look good/stable! I would like your cholesterol levels to be lower, specifically your LDL < 100 so I have ordered the next dose up of your crestor . Even though you have been off of it for a few months, I think the 20mg  dose will do well for you.  Always good seeing you. Thank you for allowing me to be your doc! Take care Dr. Jonelle Neri

## 2024-03-17 ENCOUNTER — Other Ambulatory Visit (HOSPITAL_COMMUNITY): Payer: Self-pay

## 2024-03-18 ENCOUNTER — Other Ambulatory Visit: Payer: Self-pay

## 2024-03-22 ENCOUNTER — Ambulatory Visit: Payer: 59 | Admitting: Psychology

## 2024-03-22 DIAGNOSIS — F411 Generalized anxiety disorder: Secondary | ICD-10-CM

## 2024-03-22 DIAGNOSIS — F900 Attention-deficit hyperactivity disorder, predominantly inattentive type: Secondary | ICD-10-CM | POA: Diagnosis not present

## 2024-03-22 DIAGNOSIS — F330 Major depressive disorder, recurrent, mild: Secondary | ICD-10-CM

## 2024-03-22 NOTE — Progress Notes (Signed)
 PROGRESS NOTE:    Name: Olivia [name] Date: 03/22/2024 MRN: [id number] DOB: 16-Jul-1966 PCP: Luevenia Saha, MD  Time spent: 8:00 AM - 8:59 AM  Annual Review: 05/25/2024  Today I met with  Olivia [name] in remote video (Caregility) face-to-face individual psychotherapy.  Distance Site: [name]: Dr Durand Gift Remote Office Consent: Obtained verbal consent to transmit session remotely.   Patient is aware of the limitations inherent in participating in virtual therapy.   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 She is normally a high functioning and driven individual and it is a struggle to get motivated to do anything. If it is necessary for work she does it and does a good job, but it is not the same. She has gained 70 pounds in less than 9 months and it has impacted her self image. She is also peri menopausal and these hormonal changes are also likely to be contributing to her changing mood states.   A year and a half ago her father had quadrupel by-pass surgery and he was recently diagnosed with cancer. 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  Olivia [name] states that she was diagnosed with adult ADHD and responded well to medications.  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)   [name] he is on the ASD, he is a Holiday representative and attends [employer]   Mental Status Exam: Appearance:   Casual     Behavior:  Appropriate  Motor:  Normal  Speech/Language:   NA  Affect:  Appropriate  Mood:  normal  Thought process:  normal  Thought content:    WNL  Sensory/Perceptual disturbances:    WNL  Orientation:  oriented to person, place, time/date, situation, and day of week  Attention:  Good  Concentration:  Good  Memory:  WNL  Fund of knowledge:   Good  Insight:    Good  Judgment:   Good   Impulse Control:  Good    Individualized Treatment Plan                Strengths: bright, verbal, motivated, self aware, self advocate   Supports: spouse, family, friends, colleagues   Goal/Needs for Treatment:  In order of importance to patient  1) Develop healthy interpersonal relationships that lead to the alleviation and help prevent the relapse of depression.  2) Learn and implement coping skills that result in a reduction of anxiety and worry, and improved functioning.   3) Combine skills learned in therapy into a new daily approach to managing ADHD.   Client Statement of Needs: requests assistance with piecing back together her life as it once was before she got depressed (burnt out).   Treatment Level: Weekly Individual Outpatient Psychotherapy.  Symptoms: autonomic hyperactivity (e.g., palpitations, shortness of breath, dry mouth, trouble swallowing, nausea, diarrhea).  Childhood history of Attention Deficit Disorder (ADD) that was either diagnosed or later concluded due to the symptoms of behavioral problems at school, impulsivity, temper outbursts, and lack of concentration.  Depressed or irritable mood. Diminished interest in or enjoyment of activities. Easily distracted and drawn from task at hand.  Excessive and/or unrealistic worry that is difficult to control occurring more days than not for at least 6 months about a number of events or activities.  Feelings of hopelessness, worthlessness, or inappropriate guilt.  Lack  of energy.   Poor concentration and indecisiveness.  Restless and fidgety; unable to be sedentary for more than a short time.   Client Treatment Preferences: Continue with current therapist   Healthcare consumer's goal for treatment:  Psychologist, Elder Greening, Ph.D. will support the patient's ability to achieve the goals identified. Cognitive Behavioral Therapy, Dialectical Behavioral Therapy, Motivational Interviewing, SPACE, parent training,  and other evidenced-based practices will be used to promote progress towards healthy functioning.   Healthcare consumer Olivia [name] will: Actively participate in therapy, working towards healthy functioning.    *Justification for Continuation/Discontinuation of Goal: R=Revised, O=Ongoing, A=Achieved, D=Discontinued  Goal 1) Develop healthy interpersonal relationships that lead to the alleviation and help prevent the relapse of depression.  5 Point Likert rating baseline date: 05/28/2021 Target Date Goal Was reviewed Status Code Progress towards goal/Likert rating  05/29/2023 05/28/2022         O 3/5 - learning to set better limits and boundaries, still hasn't been able to regulate her daily routines  05/25/2024 05/26/2023         O        3.75 - pt reestablished friendship but has not be consistent in reaching out, has improved her relationship with her parents with appropriate limits.  Is more accepting of her relationship with her estranged daughter as healthy for her and not a failure        Goal 2) Learn and implement coping skills that result in a reduction of anxiety and worry, and improved daily functioning.  5 Point Likert rating baseline date: 05/28/2021  Target Date Goal Was reviewed Status Code Progress towards goal/Likert rating  05/29/2023 05/28/2022          O 2/5 - learning to use her skills more consistently  05/25/2024 05/26/2023          O 3.5 - pt is learning to use her skills and better manage her unrealistic and perfectionist tendencies that keep her stressed and anxious        Goal 3) Combine skills learned in therapy into a new daily approach to managing ADHD.  5 Point Likert rating baseline date: 05/28/2021  Target Date Goal Was reviewed Status Code Progress towards goal/Likert rating  05/29/2023 05/28/2022          O 2/5 - learning to use her skills more consistently  05/25/2024 05/26/2023          O 3.75 - accepted the need and began medication, has been more aware,  recognize, acknowledge and accept the issues related ADHD and is better able to apply strategies. Pt is kinder to herself around her "limitations" or need for an alternative way of approaching tasks        This plan has been reviewed and created by the following participants:  This plan will be reviewed at least every 12 months. Date Behavioral Olivia Clinician Date Guardian/Patient   05/28/2022 Elder Greening, Ph.D.   05/28/2022 Olivia [name]  05/26/2023 Elder Greening, Ph.D.  05/26/2023 Olivia [name]              Diagnosis: Major Depressive Disorder, recurrent, mild Generalized Anxiety Disorder Attention Deficit Disorder, predominantly inattentive   Olivia [name] reports that her sleep is much improved this week and she's feeling much better.  She had a lunch with a group of friends and had a good time.  Olivia [name] was also able to get her family's cooperation in cleaning and organizing the house.  We d/e/p  how much better the quality of her life is, considering retiring earlier then she planned, and being ready to enjoy her life beyond work.  We also d/p some new sights she had about her father, and made connections between the present and the past.     Elder Greening, PhD  Son: Olivia [name] (17) he is on the ASD Friend - Amy  [employer] - Ninette Basque

## 2024-03-26 ENCOUNTER — Other Ambulatory Visit (HOSPITAL_COMMUNITY): Payer: Self-pay

## 2024-04-01 ENCOUNTER — Ambulatory Visit (INDEPENDENT_AMBULATORY_CARE_PROVIDER_SITE_OTHER): Admitting: Psychology

## 2024-04-01 DIAGNOSIS — F330 Major depressive disorder, recurrent, mild: Secondary | ICD-10-CM

## 2024-04-01 DIAGNOSIS — F411 Generalized anxiety disorder: Secondary | ICD-10-CM | POA: Diagnosis not present

## 2024-04-01 DIAGNOSIS — F900 Attention-deficit hyperactivity disorder, predominantly inattentive type: Secondary | ICD-10-CM | POA: Diagnosis not present

## 2024-04-01 DIAGNOSIS — Z600 Problems of adjustment to life-cycle transitions: Secondary | ICD-10-CM

## 2024-04-01 NOTE — Progress Notes (Signed)
 PROGRESS NOTE:    Name: Olivia [name] Date: 04/01/2024 MRN: [id number] DOB: May 06, 1966 PCP: Luevenia Saha, MD  Time spent: 8:00 AM - 8:59 AM  Annual Review: 05/25/2024  Today I met with  Olivia [name] in remote video (Caregility) face-to-face individual psychotherapy.  Distance Site: [name]: Dr Durand Gift Remote Office Consent: Obtained verbal consent to transmit session remotely.   Patient is aware of the limitations inherent in participating in virtual therapy.   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 She is normally a high functioning and driven individual and it is a struggle to get motivated to do anything. If it is necessary for work she does it and does a good job, but it is not the same. She has gained 70 pounds in less than 9 months and it has impacted her self image. She is also peri menopausal and these hormonal changes are also likely to be contributing to her changing mood [name].   A year and a half ago her father had quadrupel by-pass surgery and he was recently diagnosed with cancer. 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  Olivia [name] that she was diagnosed with adult ADHD and responded well to medications.  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)   Son: Health visitor (15) he is on the ASD, he is a Holiday representative and attends [employer]   Mental Status Exam: Appearance:   Casual     Behavior:  Appropriate  Motor:  Normal  Speech/Language:   NA  Affect:  Appropriate  Mood:  normal  Thought process:  normal  Thought content:    WNL  Sensory/Perceptual disturbances:    WNL  Orientation:  oriented to person, place, time/date, situation, and day of week  Attention:  Good  Concentration:  Good  Memory:  WNL  Fund of knowledge:   Good  Insight:    Good  Judgment:   Good   Impulse Control:  Good    Individualized Treatment Plan                Strengths: bright, verbal, motivated, self aware, self advocate   Supports: spouse, family, friends, colleagues   Goal/Needs for Treatment:  In order of importance to patient  1) Develop healthy interpersonal relationships that lead to the alleviation and help prevent the relapse of depression.  2) Learn and implement coping skills that result in a reduction of anxiety and worry, and improved functioning.   3) Combine skills learned in therapy into a new daily approach to managing ADHD.   Client Statement of Needs: requests assistance with piecing back together her life as it once was before she got depressed (burnt out).   Treatment Level: Weekly Individual Outpatient Psychotherapy.  Symptoms: autonomic hyperactivity (e.g., palpitations, shortness of breath, dry mouth, trouble swallowing, nausea, diarrhea).  Childhood history of Attention Deficit Disorder (ADD) that was either diagnosed or later concluded due to the symptoms of behavioral problems at school, impulsivity, temper outbursts, and lack of concentration.  Depressed or irritable mood. Diminished interest in or enjoyment of activities. Easily distracted and drawn from task at hand.  Excessive and/or unrealistic worry that is difficult to control occurring more days than not for at least 6 months about a number of events or activities.  Feelings of hopelessness, worthlessness, or inappropriate guilt.  Lack  of energy.   Poor concentration and indecisiveness.  Restless and fidgety; unable to be sedentary for more than a short time.   Client Treatment Preferences: Continue with current therapist   Healthcare consumer's goal for treatment:  Psychologist, Olivia [name], Ph.D. will support the patient's ability to achieve the goals identified. Cognitive Behavioral Therapy, Dialectical Behavioral Therapy, Motivational Interviewing, SPACE, parent training,  and other evidenced-based practices will be used to promote progress towards healthy functioning.   Healthcare consumer Olivia [name] will: Actively participate in therapy, working towards healthy functioning.    *Justification for Continuation/Discontinuation of Goal: R=Revised, O=Ongoing, A=Achieved, D=Discontinued  Goal 1) Develop healthy interpersonal relationships that lead to the alleviation and help prevent the relapse of depression.  5 Point Likert rating baseline date: 05/28/2021 Target Date Goal Was reviewed Status Code Progress towards goal/Likert rating  05/29/2023 05/28/2022         O 3/5 - learning to set better limits and boundaries, still hasn't been able to regulate her daily routines  05/25/2024 05/26/2023         O        3.75 - pt reestablished friendship but has not be consistent in reaching out, has improved her relationship with her parents with appropriate limits.  Is more accepting of her relationship with her estranged daughter as healthy for her and not a failure        Goal 2) Learn and implement coping skills that result in a reduction of anxiety and worry, and improved daily functioning.  5 Point Likert rating baseline date: 05/28/2021  Target Date Goal Was reviewed Status Code Progress towards goal/Likert rating  05/29/2023 05/28/2022          O 2/5 - learning to use her skills more consistently  05/25/2024 05/26/2023          O 3.5 - pt is learning to use her skills and better manage her unrealistic and perfectionist tendencies that keep her stressed and anxious        Goal 3) Combine skills learned in therapy into a new daily approach to managing ADHD.  5 Point Likert rating baseline date: 05/28/2021  Target Date Goal Was reviewed Status Code Progress towards goal/Likert rating  05/29/2023 05/28/2022          O 2/5 - learning to use her skills more consistently  05/25/2024 05/26/2023          O 3.75 - accepted the need and began medication, has been more aware,  recognize, acknowledge and accept the issues related ADHD and is better able to apply strategies. Pt is kinder to herself around her "limitations" or need for an alternative way of approaching tasks        This plan has been reviewed and created by the following participants:  This plan will be reviewed at least every 12 months. Date Behavioral Health Clinician Date Guardian/Patient   05/28/2022 Olivia [name], Ph.D.   05/28/2022 Olivia [name]  05/26/2023 Olivia [name], Ph.D.  05/26/2023 Olivia [name]              Diagnosis: Major Depressive Disorder, recurrent, mild Generalized Anxiety Disorder Attention Deficit Disorder, predominantly inattentive   Olivia [name] reports that her sleep continues to improve.  Unfortunately, she threw her back out and is in pain.  We d/e/p how changes in her parents age and health is stressful, and recent events are making certain family dynamics become clearer.  I encouraged her to reflect and  journal on a few prompts related to her sister and that we would come back to this topic in our next session.   Olivia Greening, PhD  Son: Olivia [name] (17) he is on the ASD Olivia [name] - Olivia [name]  Olivia [name] - Olivia [name]

## 2024-04-05 ENCOUNTER — Ambulatory Visit (INDEPENDENT_AMBULATORY_CARE_PROVIDER_SITE_OTHER): Admitting: Psychology

## 2024-04-05 DIAGNOSIS — F411 Generalized anxiety disorder: Secondary | ICD-10-CM

## 2024-04-05 DIAGNOSIS — F330 Major depressive disorder, recurrent, mild: Secondary | ICD-10-CM | POA: Diagnosis not present

## 2024-04-05 DIAGNOSIS — F900 Attention-deficit hyperactivity disorder, predominantly inattentive type: Secondary | ICD-10-CM

## 2024-04-05 DIAGNOSIS — F320 Major depressive disorder, single episode, mild: Secondary | ICD-10-CM

## 2024-04-05 NOTE — Progress Notes (Signed)
 PROGRESS NOTE:    Name: Olivia Olivia [name] Date: 04/05/2024 MRN: [id number] DOB: May 31, 1966 PCP: Luevenia Saha, MD  Time spent: 8:00 AM - 8:59 AM  Annual Review: 05/25/2024  Today I met with  Olivia Olivia [name] in remote video (Caregility) face-to-face individual psychotherapy.  Distance Site: [name]: Dr Durand Gift Remote Office Consent: Obtained verbal consent to transmit session remotely.   Patient is aware of the limitations inherent in participating in virtual therapy.   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 She is normally a high functioning and driven individual and it is a struggle to get motivated to do anything. If it is necessary for work she does it and does a good job, but it is not the same. She has gained 70 pounds in less than 9 months and it has impacted her self image. She is also peri menopausal and these hormonal changes are also likely to be contributing to her changing mood Olivia [name].   A year and a half ago her father had quadrupel by-pass surgery and he was recently diagnosed with cancer. 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  Olivia Olivia [name] and responded well to medications.  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)   Son: Health visitor (15) he is on the ASD, he is a Holiday representative and attends [employer]   Mental Status Exam: Appearance:   Casual     Behavior:  Appropriate  Motor:  Normal  Speech/Language:   NA  Affect:  Appropriate  Mood:  normal  Thought process:  normal  Thought content:    WNL  Sensory/Perceptual disturbances:    WNL  Orientation:  oriented to person, place, time/date, situation, and day of week  Attention:  Good  Concentration:  Good  Memory:  WNL  Fund of knowledge:   Good  Insight:    Good  Judgment:   Good   Impulse Control:  Good    Individualized Treatment Plan                Strengths: bright, verbal, motivated, self aware, self advocate   Supports: spouse, family, friends, colleagues   Goal/Needs for Treatment:  In order of importance to patient  1) Develop healthy interpersonal relationships that lead to the alleviation and help prevent the relapse of depression.  2) Learn and implement coping skills that result in a reduction of anxiety and worry, and improved functioning.   3) Combine skills learned in therapy into a new daily approach to managing Olivia [name].   Client Statement of Needs: requests assistance with piecing back together her life as it once was before she got depressed (burnt out).   Treatment Level: Weekly Individual Outpatient Psychotherapy.  Symptoms: autonomic hyperactivity (e.g., palpitations, shortness of breath, dry mouth, trouble swallowing, nausea, diarrhea).  Childhood history of Attention Deficit Disorder (ADD) that was either diagnosed or later concluded due to the symptoms of behavioral problems at school, impulsivity, temper outbursts, and lack of concentration.  Depressed or irritable mood. Diminished interest in or enjoyment of activities. Easily distracted and drawn from task at hand.  Excessive and/or unrealistic worry that is difficult to control occurring more days than not for at least 6 months about a number of events or activities.  Feelings of hopelessness, worthlessness, or inappropriate guilt.  Lack  of energy.   Poor concentration and indecisiveness.  Restless and fidgety; unable to be sedentary for more than a short time.   Client Treatment Preferences: Continue with current therapist   Olivia consumer's goal for treatment:  Psychologist, Elder Greening, Ph.D. will support the patient's ability to achieve the goals identified. Cognitive Behavioral Therapy, Dialectical Behavioral Therapy, Motivational Interviewing, SPACE, parent training,  and other evidenced-based practices will be used to promote progress towards healthy functioning.   Olivia Olivia [name] will: Actively participate in therapy, working towards healthy functioning.    *Justification for Continuation/Discontinuation of Goal: R=Revised, O=Ongoing, A=Achieved, D=Discontinued  Goal 1) Develop healthy interpersonal relationships that lead to the alleviation and help prevent the relapse of depression.  5 Point Likert rating baseline date: 05/28/2021 Target Date Goal Was reviewed Status Code Progress towards goal/Likert rating  05/29/2023 05/28/2022         O 3/5 - learning to set better limits and boundaries, still hasn't been able to regulate her daily routines  05/25/2024 05/26/2023         O        3.75 - pt reestablished friendship but has not be consistent in reaching out, has improved her relationship with her parents with appropriate limits.  Is more accepting of her relationship with her estranged daughter as healthy for her and not a failure        Goal 2) Learn and implement coping skills that result in a reduction of anxiety and worry, and improved daily functioning.  5 Point Likert rating baseline date: 05/28/2021  Target Date Goal Was reviewed Status Code Progress towards goal/Likert rating  05/29/2023 05/28/2022          O 2/5 - learning to use her skills more consistently  05/25/2024 05/26/2023          O 3.5 - pt is learning to use her skills and better manage her unrealistic and perfectionist tendencies that keep her stressed and anxious        Goal 3) Combine skills learned in therapy into a new daily approach to managing Olivia [name].  5 Point Likert rating baseline date: 05/28/2021  Target Date Goal Was reviewed Status Code Progress towards goal/Likert rating  05/29/2023 05/28/2022          O 2/5 - learning to use her skills more consistently  05/25/2024 05/26/2023          O 3.75 - accepted the need and began medication, has been more aware,  recognize, acknowledge and accept the issues related Olivia [name] and is better able to apply strategies. Pt is kinder to herself around her "limitations" or need for an alternative way of approaching tasks        This plan has been reviewed and created by the following participants:  This plan will be reviewed at least every 12 months. Date Behavioral Health Clinician Date Guardian/Patient   05/28/2022 Elder Greening, Ph.D.   05/28/2022 Olivia Olivia [name]  05/26/2023 Elder Greening, Ph.D.  05/26/2023 Olivia Olivia [name] Olivia [name]              Diagnosis: Major Depressive Disorder, recurrent, mild Generalized Anxiety Disorder Attention Deficit Disorder, predominantly inattentive   Olivia Olivia [name] reports that she was very sick all weekend and only today has she started to feel better.  Olivia Olivia [name] Olivia [name] that she had a couple of good conversations with her mother.  We d/e/p accepting that her mother wants her to take care of her affairs, worries about  how her sister will react, the history of their problems, and    Elder Greening, PhD  Son: Flossie Hussar (17) he is on the ASD Friend - Amy  Super Ego - Ninette Basque

## 2024-04-07 ENCOUNTER — Other Ambulatory Visit (HOSPITAL_BASED_OUTPATIENT_CLINIC_OR_DEPARTMENT_OTHER): Payer: Self-pay

## 2024-04-07 ENCOUNTER — Other Ambulatory Visit: Payer: Self-pay

## 2024-04-07 DIAGNOSIS — L719 Rosacea, unspecified: Secondary | ICD-10-CM | POA: Diagnosis not present

## 2024-04-07 DIAGNOSIS — Z79899 Other long term (current) drug therapy: Secondary | ICD-10-CM | POA: Diagnosis not present

## 2024-04-07 DIAGNOSIS — L409 Psoriasis, unspecified: Secondary | ICD-10-CM | POA: Diagnosis not present

## 2024-04-07 MED ORDER — CALCIPOTRIENE 0.005 % EX CREA
TOPICAL_CREAM | CUTANEOUS | 3 refills | Status: DC
  Filled 2024-04-07: qty 60, 30d supply, fill #0

## 2024-04-07 MED ORDER — TALTZ 80 MG/ML ~~LOC~~ SOAJ
SUBCUTANEOUS | 5 refills | Status: DC
  Filled 2024-04-07: qty 1, fill #0

## 2024-04-08 ENCOUNTER — Emergency Department (HOSPITAL_BASED_OUTPATIENT_CLINIC_OR_DEPARTMENT_OTHER)
Admission: EM | Admit: 2024-04-08 | Discharge: 2024-04-08 | Disposition: A | Discharge status: Home / Self Care | Attending: Emergency Medicine | Admitting: Emergency Medicine

## 2024-04-08 ENCOUNTER — Encounter (HOSPITAL_BASED_OUTPATIENT_CLINIC_OR_DEPARTMENT_OTHER): Payer: Self-pay

## 2024-04-08 ENCOUNTER — Other Ambulatory Visit: Payer: Self-pay

## 2024-04-08 ENCOUNTER — Emergency Department (HOSPITAL_BASED_OUTPATIENT_CLINIC_OR_DEPARTMENT_OTHER)

## 2024-04-08 ENCOUNTER — Other Ambulatory Visit (HOSPITAL_BASED_OUTPATIENT_CLINIC_OR_DEPARTMENT_OTHER): Payer: Self-pay

## 2024-04-08 DIAGNOSIS — Z79899 Other long term (current) drug therapy: Secondary | ICD-10-CM | POA: Diagnosis not present

## 2024-04-08 DIAGNOSIS — R197 Diarrhea, unspecified: Secondary | ICD-10-CM | POA: Diagnosis not present

## 2024-04-08 DIAGNOSIS — R1084 Generalized abdominal pain: Secondary | ICD-10-CM | POA: Insufficient documentation

## 2024-04-08 DIAGNOSIS — R112 Nausea with vomiting, unspecified: Secondary | ICD-10-CM | POA: Insufficient documentation

## 2024-04-08 DIAGNOSIS — R109 Unspecified abdominal pain: Secondary | ICD-10-CM | POA: Diagnosis not present

## 2024-04-08 LAB — CBC WITH DIFFERENTIAL/PLATELET
Abs Immature Granulocytes: 0.02 10*3/uL (ref 0.00–0.07)
Basophils Absolute: 0 10*3/uL (ref 0.0–0.1)
Basophils Relative: 0 %
Eosinophils Absolute: 0.3 10*3/uL (ref 0.0–0.5)
Eosinophils Relative: 4 %
HCT: 44.2 % (ref 36.0–46.0)
Hemoglobin: 15.4 g/dL — ABNORMAL HIGH (ref 12.0–15.0)
Immature Granulocytes: 0 %
Lymphocytes Relative: 27 %
Lymphs Abs: 2.1 10*3/uL (ref 0.7–4.0)
MCH: 27.6 pg (ref 26.0–34.0)
MCHC: 34.8 g/dL (ref 30.0–36.0)
MCV: 79.4 fL — ABNORMAL LOW (ref 80.0–100.0)
Monocytes Absolute: 0.8 10*3/uL (ref 0.1–1.0)
Monocytes Relative: 10 %
Neutro Abs: 4.6 10*3/uL (ref 1.7–7.7)
Neutrophils Relative %: 59 %
Platelets: 297 10*3/uL (ref 150–400)
RBC: 5.57 MIL/uL — ABNORMAL HIGH (ref 3.87–5.11)
RDW: 13.3 % (ref 11.5–15.5)
WBC: 7.9 10*3/uL (ref 4.0–10.5)
nRBC: 0 % (ref 0.0–0.2)

## 2024-04-08 LAB — COMPREHENSIVE METABOLIC PANEL WITH GFR
ALT: 31 U/L (ref 0–44)
AST: 36 U/L (ref 15–41)
Albumin: 4 g/dL (ref 3.5–5.0)
Alkaline Phosphatase: 64 U/L (ref 38–126)
Anion gap: 15 (ref 5–15)
BUN: <5 mg/dL — ABNORMAL LOW (ref 6–20)
CO2: 20 mmol/L — ABNORMAL LOW (ref 22–32)
Calcium: 10.2 mg/dL (ref 8.9–10.3)
Chloride: 104 mmol/L (ref 98–111)
Creatinine, Ser: 0.73 mg/dL (ref 0.44–1.00)
GFR, Estimated: >60 mL/min (ref >60)
Glucose, Bld: 103 mg/dL — ABNORMAL HIGH (ref 70–99)
Potassium: 3.5 mmol/L (ref 3.5–5.1)
Sodium: 139 mmol/L (ref 135–145)
Total Bilirubin: 1 mg/dL (ref 0.0–1.2)
Total Protein: 7.5 g/dL (ref 6.5–8.1)

## 2024-04-08 LAB — URINALYSIS, W/ REFLEX TO CULTURE (INFECTION SUSPECTED)
Bacteria, UA: NONE SEEN
Bilirubin Urine: NEGATIVE
Glucose, UA: NEGATIVE mg/dL
Ketones, ur: NEGATIVE mg/dL
Leukocytes,Ua: NEGATIVE
Nitrite: NEGATIVE
Protein, ur: 30 mg/dL — AB
Specific Gravity, Urine: >1.046 — ABNORMAL HIGH (ref 1.005–1.030)
pH: 6 (ref 5.0–8.0)

## 2024-04-08 LAB — LIPASE, BLOOD: Lipase: <10 U/L — ABNORMAL LOW (ref 11–51)

## 2024-04-08 LAB — PREGNANCY, URINE: Preg Test, Ur: NEGATIVE

## 2024-04-08 MED ORDER — FAMOTIDINE IN NACL 20-0.9 MG/50ML-% IV SOLN
20.0000 mg | Freq: Once | INTRAVENOUS | Status: AC
  Administered 2024-04-08: 20 mg via INTRAVENOUS
  Filled 2024-04-08: qty 50

## 2024-04-08 MED ORDER — MORPHINE SULFATE (PF) 4 MG/ML IV SOLN
4.0000 mg | Freq: Once | INTRAVENOUS | Status: AC
  Administered 2024-04-08: 4 mg via INTRAVENOUS
  Filled 2024-04-08: qty 1

## 2024-04-08 MED ORDER — ALUM & MAG HYDROXIDE-SIMETH 200-200-20 MG/5ML PO SUSP
30.0000 mL | Freq: Once | ORAL | Status: AC
  Administered 2024-04-08: 30 mL via ORAL
  Filled 2024-04-08: qty 30

## 2024-04-08 MED ORDER — ONDANSETRON HCL 4 MG/2ML IJ SOLN
4.0000 mg | Freq: Once | INTRAMUSCULAR | Status: AC
  Administered 2024-04-08: 4 mg via INTRAVENOUS
  Filled 2024-04-08: qty 2

## 2024-04-08 MED ORDER — LACTATED RINGERS IV BOLUS
1000.0000 mL | Freq: Once | INTRAVENOUS | Status: AC
  Administered 2024-04-08: 1000 mL via INTRAVENOUS

## 2024-04-08 MED ORDER — OXYCODONE HCL 5 MG PO TABS
5.0000 mg | ORAL_TABLET | Freq: Three times a day (TID) | ORAL | 0 refills | Status: DC | PRN
  Filled 2024-04-08: qty 8, 3d supply, fill #0

## 2024-04-08 MED ORDER — SUCRALFATE 1 GM/10ML PO SUSP
1.0000 g | Freq: Three times a day (TID) | ORAL | Status: DC
  Administered 2024-04-08: 1 g via ORAL
  Filled 2024-04-08: qty 10

## 2024-04-08 MED ORDER — DICYCLOMINE HCL 20 MG PO TABS
20.0000 mg | ORAL_TABLET | Freq: Two times a day (BID) | ORAL | 0 refills | Status: DC | PRN
Start: 2024-04-08 — End: 2024-05-24
  Filled 2024-04-08: qty 20, 10d supply, fill #0

## 2024-04-08 MED ORDER — IOHEXOL 300 MG/ML  SOLN
100.0000 mL | Freq: Once | INTRAMUSCULAR | Status: AC | PRN
  Administered 2024-04-08: 100 mL via INTRAVENOUS

## 2024-04-08 MED ORDER — ONDANSETRON 4 MG PO TBDP
4.0000 mg | ORAL_TABLET | Freq: Three times a day (TID) | ORAL | 0 refills | Status: DC | PRN
Start: 2024-04-08 — End: 2024-05-24
  Filled 2024-04-08: qty 15, 5d supply, fill #0

## 2024-04-08 MED ORDER — DICYCLOMINE HCL 10 MG/ML IM SOLN
20.0000 mg | Freq: Once | INTRAMUSCULAR | Status: AC
  Administered 2024-04-08: 20 mg via INTRAMUSCULAR
  Filled 2024-04-08: qty 2

## 2024-04-08 NOTE — ED Notes (Signed)
Reviewed discharge instructions, medications, and home care with pt. Pt verbalized understanding and had no further questions. Pt exited ED without complications.

## 2024-04-08 NOTE — ED Provider Notes (Signed)
 [place] EMERGENCY DEPARTMENT AT [facility]  Provider Note  CSN: [id number] Arrival date & time: 04/08/24 5784  History Chief Complaint  Patient presents with   Abdominal Pain    Olivia [name] is a 58 y.o. female with history of lap band (currently no fill) reports onset of nausea about 6 days ago, progressed to diffuse watery diarrhea and cramping abdominal pain. Diarrhea improved with Lomotil at home, but abdominal pain and nausea have persistent. Unable to keep down any oral intake. Occasional vomiting.    Home Medications Prior to Admission medications   Medication Sig Start Date End Date Taking? Authorizing Provider  albuterol  (PROVENTIL  HFA) 108 (90 Base) MCG/ACT inhaler Inhale 2 puffs into the lungs every 6 (six) hours as needed for wheezing or shortness of breath. 07/30/21   Luevenia Saha, MD  albuterol  (PROVENTIL ) (2.5 MG/3ML) 0.083% nebulizer solution Take 3 mLs (2.5 mg total) by nebulization every 6 (six) hours as needed for wheezing or shortness of breath. 11/22/22   Luevenia Saha, MD  azelastine  (OPTIVAR ) 0.05 % ophthalmic solution Place 2 drops into the right eye 2 (two) times daily. 02/08/24   Yevette Hem, FNP  buPROPion  (WELLBUTRIN  XL) 150 MG 24 hr tablet Take 1 tablet (150 mg total) by mouth daily. 11/21/23   Luevenia Saha, MD  calcipotriene  (DOVONOX) 0.005 % cream Apply to affected areas externally Twice a day 04/07/24     cetirizine (ZYRTEC) 10 MG tablet Take 10 mg by mouth daily as needed for allergies.     [provider]  clobetasol  cream (TEMOVATE ) 0.05 % Apply 1 application Externally Twice a day 14 days 03/21/23     clobetasol  ointment (TEMOVATE ) 0.05 % Apply 1 application Externally Twice a day 14 days 03/21/23     escitalopram  (LEXAPRO ) 20 MG tablet Take 1 tablet (20 mg total) by mouth daily. 09/22/23   Luevenia Saha, MD  famotidine (PEPCID) 20 MG tablet Take 20 mg by mouth daily as needed for heartburn or indigestion.    [provider]  ixekizumab  (TALTZ ) 80 MG/ML pen Inject 80 mg (1ml) into the skin once every 4 weeks. 01/19/24   Jegede, Olugbemiga E, MD  ixekizumab  (TALTZ ) 80 MG/ML pen 1 injection Subcutaneous Once every 4 weeks 04/07/24     levothyroxine  (SYNTHROID ) 100 MCG tablet Take 1 tablet (100 mcg total) by mouth daily. 10/17/23   Luevenia Saha, MD  liothyronine  (CYTOMEL ) 25 MCG tablet Take 1 tablet (25 mcg total) by mouth daily. 03/16/24   Luevenia Saha, MD  metoprolol  tartrate (LOPRESSOR ) 25 MG tablet Take 12.5 mg by mouth daily as needed. 01/17/21   Luevenia Saha, MD  metroNIDAZOLE  (METROCREAM ) 0.75 % cream Apply 1 Application topically 2 (two) times daily. 06/03/23     Multiple Vitamin (MULITIVITAMIN WITH MINERALS) TABS Take 1 tablet by mouth daily.    [provider]  Olopatadine  HCl 0.2 % SOLN Apply 1 drop to eye daily. 02/04/24   Farris Hong, PA-C  progesterone  (PROMETRIUM ) 100 MG capsule Take 1 capsule (100 mg total) by mouth at bedtime. 08/04/23     rosuvastatin  (CRESTOR ) 20 MG tablet Take 1 tablet (20 mg total) by mouth at bedtime. 03/16/24   Luevenia Saha, MD  triamcinolone  cream (KENALOG ) 0.1 % Apply as directed to affected area twice a day for 10-14 days as needed for flares 10/16/22     Vitamin D , Cholecalciferol , 50 MCG (2000 UT) CAPS Take 2,000 Units by  mouth daily.    [provider]     Allergies    Cephalosporins, Egg-derived products, Penicillins, Sulfa drugs cross reactors, Adhesive [tape], and Effexor  [venlafaxine ]   Review of Systems   Review of Systems Please see HPI for pertinent positives and negatives  Physical Exam BP (!) 143/107   Pulse 81   Temp 98.3 F (36.8 C) (Oral)   Resp 20   Ht 5\' 10"  (1.778 m)   Wt (!) 140.2 kg   SpO2 94%   BMI 44.34 kg/m   Physical Exam Vitals and nursing note reviewed.  Constitutional:      Appearance: Normal appearance.  HENT:     Head: Normocephalic and atraumatic.     Nose: Nose normal.      Mouth/Throat:     Mouth: Mucous membranes are moist.  Eyes:     Extraocular Movements: Extraocular movements intact.     Conjunctiva/sclera: Conjunctivae normal.  Cardiovascular:     Rate and Rhythm: Normal rate.  Pulmonary:     Effort: Pulmonary effort is normal.     Breath sounds: Normal breath sounds.  Abdominal:     General: Abdomen is flat.     Palpations: Abdomen is soft.     Tenderness: There is generalized abdominal tenderness. There is no guarding. Negative signs include Murphy's sign and McBurney's sign.  Musculoskeletal:        General: No swelling. Normal range of motion.     Cervical back: Neck supple.  Skin:    General: Skin is warm and dry.  Neurological:     General: No focal deficit present.     Mental Status: She is alert.  Psychiatric:        Mood and Affect: Mood normal.     ED Results / Procedures / Treatments   EKG None  Procedures Procedures  Medications Ordered in the ED Medications  morphine  (PF) 4 MG/ML injection 4 mg (4 mg Intravenous Given 04/08/24 0543)  ondansetron  (ZOFRAN ) injection 4 mg (4 mg Intravenous Given 04/08/24 0544)  lactated ringers  bolus 1,000 mL (1,000 mLs Intravenous New Bag/Given 04/08/24 0549)  iohexol (OMNIPAQUE) 300 MG/ML solution 100 mL (100 mLs Intravenous Contrast Given 04/08/24 6213)  morphine  (PF) 4 MG/ML injection 4 mg (4 mg Intravenous Given 04/08/24 0659)    Initial Impression and Plan  Patient here with several days of abdominal cramping, N/V/D. She reports concentrated urine, some burning. No fevers. No blood. Will check labs and send for CT to eval acute process. Pain/nausea meds and IVF for comfort.   ED Course   Clinical Course as of 04/08/24 0702  Thu Apr 08, 2024  0601 CBC is unremarkable.  [CS]  0865 CMP and Lipase unremarkable.  [CS]  0701 Care of patient signed out at the change of shift.  [CS]    Clinical Course User Index [CS] Charmayne Cooper, MD     MDM Rules/Calculators/A&P Medical Decision  Making Problems Addressed: Nausea vomiting and diarrhea: acute illness or injury  Amount and/or Complexity of Data Reviewed Labs: ordered. Decision-making details documented in ED Course. Radiology: ordered.  Risk Prescription drug management. Parenteral controlled substances.     Final Clinical Impression(s) / ED Diagnoses Final diagnoses:  Nausea vomiting and diarrhea    Rx / DC Orders ED Discharge Orders     None        Charmayne Cooper, MD 04/08/24 [phone]

## 2024-04-08 NOTE — ED Notes (Signed)
 ED Provider at bedside.

## 2024-04-08 NOTE — ED Notes (Signed)
 Patient transported to CT

## 2024-04-08 NOTE — ED Provider Notes (Signed)
 I assumed care of this patient by earlier provider.  Briefly is a 58 year old presents now with 6 days of severe cramping abdominal pain, nausea vomiting, diarrhea.  No sick contacts in the house or fevers.  The patient is a Publishing rights manager.  She denies recent antibiotic use, drinking unfiltered water, or foreign travel.  No prior history of C. difficile.  Workup in the hospital was notable for elevated specific gravity and UA without evidence of infection.  No AKI, creatinine 0.73.  Electrolytes within normal limits.  LFTs and lipase normal.  No leukocytosis.  CT abdomen pelvis showed prior history of Lap-Band surgery with some potential esophagitis findings as well as intermittent bowel loop dilation.  I think this is mostly consistent with a viral gastroenteritis and potential esophagitis.  The patient did receive some morphine  last night with improvement of her pain but is concerned about inability to tolerate p.o.  She is wanting additional GI medications which we discussed and will give at this time.  She does not appear clinically dehydrated, but we will see if we can improve her pain and intake well enough to go home.  I do not see acute risk factors for C. difficile or indication for antibiotics at this time.  Clinical Course as of 04/08/24 1405  Thu Apr 08, 2024  0601 CBC is unremarkable.  [CS]  1914 CMP and Lipase unremarkable.  [CS]  0701 Care of patient signed out at the change of shift.  [CS]  1321 Feeling better will attempt PO challenge again [MT]  1404 Patient was able to tolerate and keep down fluids and her medications.  She reports significant cramping of her abdominal pain began with any consumption of fluids.  I suspect this is likely ongoing spasms from a likely viral illness.  I did give her the option of observation stay in the hospital given the ongoing issues with her pain, but she strongly prefers to go home.  She is able to keep down fluids and medications and will gradually  wean herself onto a brat diet over the next 2 days, but will stick mostly to clear liquids for the next 1 to 2 days.  She and her husband are comfortable with this plan.  Strict return precautions were discussed.  She will also reach out to her PCP and feels that she can arrange to be seen again likely tomorrow in the office for recheck. [MT]    Clinical Course User Index [CS] Charmayne Cooper, MD [MT] Arvilla Birmingham, MD      Arvilla Birmingham, MD 04/08/24 [phone]

## 2024-04-08 NOTE — ED Triage Notes (Signed)
 Pt reports 1 episode of vomiting on last Friday. More frequent vomiting with severe diarrhea starting on Saturday and lasting until today. Generalized abdominal pain and gas reported. Pt reports 1 day where she was incontinent of stool. Denies fever.

## 2024-04-12 ENCOUNTER — Ambulatory Visit (INDEPENDENT_AMBULATORY_CARE_PROVIDER_SITE_OTHER): Admitting: Psychology

## 2024-04-12 DIAGNOSIS — F900 Attention-deficit hyperactivity disorder, predominantly inattentive type: Secondary | ICD-10-CM

## 2024-04-12 DIAGNOSIS — F320 Major depressive disorder, single episode, mild: Secondary | ICD-10-CM | POA: Diagnosis not present

## 2024-04-12 DIAGNOSIS — F411 Generalized anxiety disorder: Secondary | ICD-10-CM | POA: Diagnosis not present

## 2024-04-12 NOTE — Progress Notes (Signed)
 PROGRESS NOTE:    Name: Olivia [name] Date: 04/12/2024 MRN: [id number] DOB: [birth year] PCP: Luevenia Saha, MD  Time spent: 4:00 PM - 4:59 PM  Annual Review: 05/25/2024  Today I met with  Olivia [name] in remote video (Caregility) face-to-face individual psychotherapy.  Distance Site: [name]: Dr Durand Gift Remote Office Consent: Obtained verbal consent to transmit session remotely.   Patient is aware of the limitations inherent in participating in virtual therapy.   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 She is normally a high functioning and driven individual and it is a struggle to get motivated to do anything. If it is necessary for work she does it and does a good job, but it is not the same. She has gained 70 pounds in less than 9 months and it has impacted her self image. She is also peri menopausal and these hormonal changes are also likely to be contributing to her changing mood states.   A year and a half ago her father had quadrupel by-pass surgery and he was recently diagnosed with cancer. 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  Olivia [name] states that she was diagnosed with adult ADHD and responded well to medications.  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)   Son: Health visitor (15) he is on the ASD, he is a Holiday representative and attends [employer]   Mental Status Exam: Appearance:   Casual     Behavior:  Appropriate  Motor:  Normal  Speech/Language:   NA  Affect:  Appropriate  Mood:  normal  Thought process:  normal  Thought content:    WNL  Sensory/Perceptual disturbances:    WNL  Orientation:  oriented to person, place, time/date, situation, and day of week  Attention:  Good  Concentration:  Good  Memory:  WNL  Fund of knowledge:   Good  Insight:    Good  Judgment:   Good   Impulse Control:  Good    Individualized Treatment Plan                Strengths: bright, verbal, motivated, self aware, self advocate   Supports: spouse, family, friends, colleagues   Goal/Needs for Treatment:  In order of importance to patient  1) Develop healthy interpersonal relationships that lead to the alleviation and help prevent the relapse of depression.  2) Learn and implement coping skills that result in a reduction of anxiety and worry, and improved functioning.   3) Combine skills learned in therapy into a new daily approach to managing ADHD.   Client Statement of Needs: requests assistance with piecing back together her life as it once was before she got depressed (burnt out).   Treatment Level: Weekly Individual Outpatient Psychotherapy.  Symptoms: autonomic hyperactivity (e.g., palpitations, shortness of breath, dry mouth, trouble swallowing, nausea, diarrhea).  Childhood history of Attention Deficit Disorder (ADD) that was either diagnosed or later concluded due to the symptoms of behavioral problems at school, impulsivity, temper outbursts, and lack of concentration.  Depressed or irritable mood. Diminished interest in or enjoyment of activities. Easily distracted and drawn from task at hand.  Excessive and/or unrealistic worry that is difficult to control occurring more days than not for at least 6 months about a number of events or activities.  Feelings of hopelessness, worthlessness, or inappropriate guilt.  Lack  of energy.   Poor concentration and indecisiveness.  Restless and fidgety; unable to be sedentary for more than a short time.   Client Treatment Preferences: Continue with current therapist   Healthcare consumer's goal for treatment:  Psychologist, Elder Greening, Ph.D. will support the patient's ability to achieve the goals identified. Cognitive Behavioral Therapy, Dialectical Behavioral Therapy, Motivational Interviewing, SPACE, parent training,  and other evidenced-based practices will be used to promote progress towards healthy functioning.   Healthcare consumer Olivia [name] will: Actively participate in therapy, working towards healthy functioning.    *Justification for Continuation/Discontinuation of Goal: R=Revised, O=Ongoing, A=Achieved, D=Discontinued  Goal 1) Develop healthy interpersonal relationships that lead to the alleviation and help prevent the relapse of depression.  5 Point Likert rating baseline date: 05/28/2021 Target Date Goal Was reviewed Status Code Progress towards goal/Likert rating  05/29/2023 05/28/2022         O 3/5 - learning to set better limits and boundaries, still hasn't been able to regulate her daily routines  05/25/2024 05/26/2023         O        3.75 - pt reestablished friendship but has not be consistent in reaching out, has improved her relationship with her parents with appropriate limits.  Is more accepting of her relationship with her estranged daughter as healthy for her and not a failure        Goal 2) Learn and implement coping skills that result in a reduction of anxiety and worry, and improved daily functioning.  5 Point Likert rating baseline date: 05/28/2021  Target Date Goal Was reviewed Status Code Progress towards goal/Likert rating  05/29/2023 05/28/2022          O 2/5 - learning to use her skills more consistently  05/25/2024 05/26/2023          O 3.5 - pt is learning to use her skills and better manage her unrealistic and perfectionist tendencies that keep her stressed and anxious        Goal 3) Combine skills learned in therapy into a new daily approach to managing ADHD.  5 Point Likert rating baseline date: 05/28/2021  Target Date Goal Was reviewed Status Code Progress towards goal/Likert rating  05/29/2023 05/28/2022          O 2/5 - learning to use her skills more consistently  05/25/2024 05/26/2023          O 3.75 - accepted the need and began medication, has been more aware,  recognize, acknowledge and accept the issues related ADHD and is better able to apply strategies. Pt is kinder to herself around her "limitations" or need for an alternative way of approaching tasks        This plan has been reviewed and created by the following participants:  This plan will be reviewed at least every 12 months. Date Behavioral Health Clinician Date Guardian/Patient   05/28/2022 Elder Greening, Ph.D.   05/28/2022 Olivia [name]  05/26/2023 Elder Greening, Ph.D.  05/26/2023 Olivia [name]              Diagnosis: Major Depressive Disorder, recurrent, mild Generalized Anxiety Disorder Attention Deficit Disorder, predominantly inattentive   Olivia [name] reports that she continued to be very sick for the rest of the week.  We d/p how feeling as sick as she was, she had a lot more compassion for her father and what he's been through.  Lastly, we d/p a couple of situations that occurred  with her son, helping her son to understand important social issues related to being a 79'4" female, other issues related to his being on the Autism Spectrum.  I provided support and guidance around parenting a young adult preparing to launch.   Elder Greening, PhD  Son: Olivia [name] (17) he is on the ASD Friend - Amy  Super Ego - Ninette Basque

## 2024-04-14 ENCOUNTER — Other Ambulatory Visit: Payer: Self-pay | Admitting: Pharmacy Technician

## 2024-04-14 ENCOUNTER — Other Ambulatory Visit: Payer: Self-pay

## 2024-04-14 NOTE — Progress Notes (Signed)
 Specialty Pharmacy Ongoing Clinical Assessment Note  Olivia [name] is a 58 y.o. female who is being followed by the specialty pharmacy service for RxSp Psoriasis   Patient's specialty medication(s) reviewed today: Ixekizumab  (Taltz )   Missed doses in the last 4 weeks: 0   Patient/Caregiver did not have any additional questions or concerns.   Therapeutic benefit summary: Patient is achieving benefit   Adverse events/side effects summary: No adverse events/side effects   Patient's therapy is appropriate to: Continue    Goals Addressed             This Visit's Progress    Reduce signs and symptoms   On track    Patient is on track. Patient will maintain adherence. Patient describes medication as being life changing. She has had a few dry spots appear, but able to control with calcipotriene  and/or clobetasol  if needed.         Follow up: 12 months  [facility]

## 2024-04-14 NOTE — Progress Notes (Signed)
 Specialty Pharmacy Refill Coordination Note  Olivia [name] is a 58 y.o. female contacted today regarding refills of specialty medication(s) Ixekizumab  (Taltz )   Patient requested Delivery   Delivery date: 04/16/24   Verified address: [address]   [place]   Medication will be filled on 04/15/24.

## 2024-04-15 ENCOUNTER — Ambulatory Visit (HOSPITAL_BASED_OUTPATIENT_CLINIC_OR_DEPARTMENT_OTHER): Admitting: Adult Health

## 2024-04-15 ENCOUNTER — Encounter (HOSPITAL_BASED_OUTPATIENT_CLINIC_OR_DEPARTMENT_OTHER): Payer: Self-pay | Admitting: Adult Health

## 2024-04-15 ENCOUNTER — Other Ambulatory Visit (HOSPITAL_BASED_OUTPATIENT_CLINIC_OR_DEPARTMENT_OTHER): Payer: Self-pay

## 2024-04-15 VITALS — BP 115/91 | HR 87 | Ht 69.5 in

## 2024-04-15 DIAGNOSIS — G4719 Other hypersomnia: Secondary | ICD-10-CM | POA: Diagnosis not present

## 2024-04-15 DIAGNOSIS — R400 Somnolence: Secondary | ICD-10-CM | POA: Insufficient documentation

## 2024-04-15 NOTE — Assessment & Plan Note (Signed)
 Healthy weight loss discussed

## 2024-04-15 NOTE — Assessment & Plan Note (Signed)
 Daytime sleepiness, fatigue, weight gain, restless sleep, chronic insomnia, erythrocytosis all concerning for underlying sleep apnea.  Set patient up for home sleep study patient education given on sleep apnea  - discussed how weight can impact sleep and risk for sleep disordered breathing - discussed options to assist with weight loss: combination of diet modification, cardiovascular and strength training exercises   - had an extensive discussion regarding the adverse health consequences related to untreated sleep disordered breathing - specifically discussed the risks for hypertension, coronary artery disease, cardiac dysrhythmias, cerebrovascular disease, and diabetes - lifestyle modification discussed   - discussed how sleep disruption can increase risk of accidents, particularly when driving - safe driving practices were discussed   Plan  Patient Instructions  Set up for home sleep study  Work on healthy weight loss  Do not drive if sleepy  Follow up in 6 weeks to discuss results and treatment plan -Virtual visit

## 2024-04-15 NOTE — Patient Instructions (Signed)
 Set up for home sleep study  Work on healthy weight loss  Do not drive if sleepy  Follow up in 6 weeks to discuss results and treatment plan -Virtual visit

## 2024-04-15 NOTE — Progress Notes (Signed)
 Epworth Sleepiness Scale  Use the following scale to choose the most appropriate number for each situation. 0 Would never nod off 1  Slight  chance of nodding off 2 Moderate chance of nodding off 3 High chance of nodding off  Sitting and reading: 0 Watching TV: 1 Sitting, inactive, in a public place (e.g., in a meeting, theater, or dinner event): 0 As a passenger in a car for an hour or more without stopping for a break: 0 Lying down to rest when circumstances permit:1 Sitting and talking to someone: 0 Sitting quietly after a meal without alcohol: 0 In a car, while stopped for a few  minutes in traffic or at a light: 0  TOTOAL: 2

## 2024-04-15 NOTE — Progress Notes (Signed)
 @Patient  ID: Olivia [name], female    DOB: November 06, 1965, 58 y.o.   MRN: [id number]  Chief Complaint  Patient presents with   Establish Care    Sleep consult    Referring provider: Luevenia Saha, MD  HPI: 58 year old seen for sleep consult April 15, 2024 for daytime fatigue, restless sleep, insomnia, erythrocytosis  TEST/EVENTS :   04/15/2024 Sleep  consult  Patient presents for a sleep consult today.  Kindly referred by primary care provider Dr. Jonelle Neri.  Patient complains that she has had longstanding chronic insomnia for most of her adult life.  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  She says of late her sleeping has been some better.  But all it takes is 1 or 2 nights of not sleeping well to disrupt her entire sleep pattern for several weeks at a time.  She complains that she feels tired upon awakening.  Feels fatigued throughout the day.  Has some daytime sleepiness.  Epworth score is 2 out of 24.  Typically gets sleepy if she sits down to watch TV and in the afternoon hours.  She does occasionally take a nap for 1 to 2 hours.  Could be longer if she had at the time.  Does not take any significant sleep aids.  Occasionally will take a Zanaflex .  She does get headaches.  No teeth grinding or teeth clenching.  No history of congestive heart failure or stroke.  No symptoms suspicious for cataplexy or sleep paralysis.  Does not have any reported significant snoring but spouse reports very heavy breathing during her sleep.  Typically goes to sleep between 9 PM and midnight.  Can take up to 30 minutes or longer to go to sleep.  Has trouble going back to sleep if she wakes up in the melanite.  Gets up 2-4 times each night.  Gets up at 8 AM.  Weight has been trending up for the last several years.  She says she is up over 100 pounds over the last several years. Patient is also concerned that her hemoglobin and hematocrit has been remaining  elevated last hemoglobin was 15.4 and RBC was elevated at 5.57.  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  She is over the SANE program .  Patient is married.  She lives at home with her husband and son.  She is a never smoker.  No alcohol or drug use.  Family history positive for emphysema, allergies, asthma, heart disease, cancer, sleep apnea  Past Surgical History:  Procedure Laterality Date   HIATAL HERNIA REPAIR  04/14/2012   Procedure: LAPAROSCOPIC REPAIR OF HIATAL HERNIA;  Surgeon: Azucena Bollard, MD;  Location: [facility];  Service: General;  Laterality: N/A;  Hiatal Hernia Repair-Replacement of Lap Band   LAPAROSCOPIC GASTRIC BANDING  08/08/2008   PARATHYROIDECTOMY Left 06/02/2020   Procedure: LEFT INFERIOR PARATHYROIDECTOMY;  Surgeon: Oralee Billow, MD;  Location: [facility];  Service: General;  Laterality: Left;   THYROIDECTOMY N/A 06/02/2020   Procedure: TOTAL THYROIDECTOMY;  Surgeon: Oralee Billow, MD;  Location: [facility];  Service: General;  Laterality: N/A;   TONSILLECTOMY  1981     Allergies  Allergen Reactions   Cephalosporins Anaphylaxis and Other (See Comments)    Caused hypotension and a fever   Egg-Derived Products Hives, Swelling and Other (See Comments)    Angioedema   Penicillins Anaphylaxis    Has patient  had a PCN reaction causing immediate rash, facial/tongue/throat swelling, SOB or lightheadedness with hypotension: Yes Has patient had a PCN reaction causing severe rash involving mucus membranes or skin necrosis: No Has patient had a PCN reaction that required hospitalization: Observed X12 hrs Has patient had a PCN reaction occurring within the last 10 years: No If all of the above answers are NO, then may proceed with Cephalosporin use.    Sulfa Drugs Cross Reactors Other (See Comments)    Liver failure   Adhesive [Tape] Hives and Itching    Cannot wear for any length of time   Effexor  [Venlafaxine ] Other (See Comments)    Lightheadedness,  dizziness, and headaches resulted    Immunization History  Administered Date(s) Administered   Influenza Inj Mdck Quad Pf 07/21/2020   Influenza, Quadrivalent, Recombinant, Inj, Pf 08/03/2019   Influenza, Seasonal, Injecte, Preservative Fre 09/03/2023   Influenza,inj,Quad PF,6+ Mos 08/07/2018, 07/21/2020   Influenza-Unspecified 10/25/2014, 08/15/2021   Moderna Covid-19 Vaccine Bivalent Booster 77yrs & up 03/21/2021   Moderna SARS-COV2 Booster Vaccination 03/29/2021   PFIZER(Purple Top)SARS-COV-2 Vaccination 10/27/2019, 11/17/2019, 08/18/2020   PNEUMOCOCCAL CONJUGATE-20 03/09/2024   Tdap 05/01/2020   Tetanus 04/04/1997, 11/05/2007   Zoster Recombinant(Shingrix) 05/22/2021, 11/06/2021    Past Medical History:  Diagnosis Date   Anxiety    Asthma    Environmental allergies    Hiatal hernia    Hiatal hernia without gangrene or obstruction 08/19/2018   History of parathyroid  surgery 2021 01/17/2021   For primary hyperparathyroidism. Dr. Sofia Dunn   HLD (hyperlipidemia) 04/15/2008   Microcytic anemia    Migraine, hormonal    Multiple thyroid  nodules 08/19/2018   2016 IMPRESSION: Multinodular goiter with multiple bilateral thyroid  nodules. These are generally round and hypoechoic. There is a mildly complex cyst in the right lobe measuring up to 1.1 cm. The largest solid nodule is in the inferior right lobe measures 1.8 x 1.1 x 1.4 cm. This could either be sampled or followed by ultrasound.   PAC (premature atrial contraction)    PACs (premature atrial contraction)    Palpitations    PCOS (polycystic ovarian syndrome) 10/25/2014   PONV (postoperative nausea and vomiting)    Pre-diabetes    Reflux    Seasonal allergies     Tobacco History: Social History   Tobacco Use  Smoking Status Never  Smokeless Tobacco Never   Counseling given: Not Answered   Outpatient Medications Prior to Visit  Medication Sig Dispense Refill   albuterol  (PROVENTIL  HFA) 108 (90 Base) MCG/ACT inhaler  Inhale 2 puffs into the lungs every 6 (six) hours as needed for wheezing or shortness of breath. 18 g 2   albuterol  (PROVENTIL ) (2.5 MG/3ML) 0.083% nebulizer solution Take 3 mLs (2.5 mg total) by nebulization every 6 (six) hours as needed for wheezing or shortness of breath. 75 mL 12   buPROPion  (WELLBUTRIN  XL) 150 MG 24 hr tablet Take 1 tablet (150 mg total) by mouth daily. 90 tablet 3   calcipotriene  (DOVONOX) 0.005 % cream Apply to affected areas externally Twice a day 60 g 3   cetirizine (ZYRTEC) 10 MG tablet Take 10 mg by mouth daily as needed for allergies.      clobetasol  cream (TEMOVATE ) 0.05 % Apply 1 application Externally Twice a day 14 days 45 g 1   clobetasol  ointment (TEMOVATE ) 0.05 % Apply 1 application Externally Twice a day 14 days 45 g 1   escitalopram  (LEXAPRO ) 20 MG tablet Take 1 tablet (20 mg total) by mouth daily.  90 tablet 3   ixekizumab  (TALTZ ) 80 MG/ML pen Inject 80 mg (1ml) into the skin once every 4 weeks. 1 mL 4   levothyroxine  (SYNTHROID ) 100 MCG tablet Take 1 tablet (100 mcg total) by mouth daily. 90 tablet 3   liothyronine  (CYTOMEL ) 25 MCG tablet Take 1 tablet (25 mcg total) by mouth daily.     metoprolol  tartrate (LOPRESSOR ) 25 MG tablet Take 12.5 mg by mouth daily as needed. 60 tablet 1   metroNIDAZOLE  (METROCREAM ) 0.75 % cream Apply 1 Application topically 2 (two) times daily. 45 g 1   Multiple Vitamin (MULITIVITAMIN WITH MINERALS) TABS Take 1 tablet by mouth daily.     progesterone  (PROMETRIUM ) 100 MG capsule Take 1 capsule (100 mg total) by mouth at bedtime. 60 capsule 2   rosuvastatin  (CRESTOR ) 20 MG tablet Take 1 tablet (20 mg total) by mouth at bedtime. 90 tablet 3   triamcinolone  cream (KENALOG ) 0.1 % Apply as directed to affected area twice a day for 10-14 days as needed for flares 454 g 0   Vitamin D , Cholecalciferol , 50 MCG (2000 UT) CAPS Take 2,000 Units by mouth daily.     azelastine  (OPTIVAR ) 0.05 % ophthalmic solution Place 2 drops into the right eye 2  (two) times daily. (Patient not taking: Reported on 04/15/2024) 6 mL 12   dicyclomine  (BENTYL ) 20 MG tablet Take 1 tablet (20 mg total) by mouth 2 (two) times daily as needed for up to 20 doses for spasms. (Patient not taking: Reported on 04/15/2024) 20 tablet 0   famotidine  (PEPCID ) 20 MG tablet Take 20 mg by mouth daily as needed for heartburn or indigestion. (Patient not taking: Reported on 04/15/2024)     ixekizumab  (TALTZ ) 80 MG/ML pen 1 injection Subcutaneous Once every 4 weeks 1 mL 5   Olopatadine  HCl 0.2 % SOLN Apply 1 drop to eye daily. (Patient not taking: Reported on 04/15/2024) 2.5 mL 0   ondansetron  (ZOFRAN -ODT) 4 MG disintegrating tablet Take 1 tablet (4 mg total) by mouth every 8 (eight) hours as needed for up to 15 doses for nausea or vomiting. (Patient not taking: Reported on 04/15/2024) 15 tablet 0   oxyCODONE  (ROXICODONE ) 5 MG immediate release tablet Take 1 tablet (5 mg total) by mouth every 8 (eight) hours as needed for up to 8 doses for breakthrough pain. (Patient not taking: Reported on 04/15/2024) 8 tablet 0   No facility-administered medications prior to visit.     Review of Systems:   Constitutional:   No  weight loss, night sweats,  Fevers, chills, +fatigue, or  lassitude.  HEENT:   No headaches,  Difficulty swallowing,  Tooth/dental problems, or  Sore throat,                No sneezing, itching, ear ache, nasal congestion, post nasal drip,   CV:  No chest pain,  Orthopnea, PND, swelling in lower extremities, anasarca, dizziness, palpitations, syncope.   GI  No heartburn, indigestion, abdominal pain, nausea, vomiting, diarrhea, change in bowel habits, loss of appetite, bloody stools.   Resp: No shortness of breath with exertion or at rest.  No excess mucus, no productive cough,  No non-productive cough,  No coughing up of blood.  No change in color of mucus.  No wheezing.  No chest wall deformity  Skin: no rash or lesions.  GU: no dysuria, change in color of urine, no  urgency or frequency.  No flank pain, no hematuria   MS:  No joint pain  or swelling.  No decreased range of motion.  No back pain.    Physical Exam  BP (!) 115/91   Pulse 87   Ht 5' 9.5 (1.765 m)   SpO2 96%   BMI 44.98 kg/m   GEN: A/Ox3; pleasant , NAD, well nourished    HEENT:  [place]/AT, hide NOSE-clear, THROAT-clear, no lesions, no postnasal drip or exudate noted.  Class III MP airway  NECK:  Supple w/ fair ROM; no JVD; normal carotid impulses w/o bruits; no thyromegaly or nodules palpated; no lymphadenopathy.    RESP  Clear  P & A; w/o, wheezes/ rales/ or rhonchi. no accessory muscle use, no dullness to percussion  CARD:  RRR, no m/r/g, no peripheral edema, pulses intact, no cyanosis or clubbing.  GI:   Soft & nt; nml bowel sounds; no organomegaly or masses detected.   Musco: Warm bil, no deformities or joint swelling noted.   Neuro: alert, no focal deficits noted.    Skin: Warm, no lesions or rashes    Lab Results:  CBC    Component Value Date/Time   WBC 7.9 04/08/2024 0542   RBC 5.57 (H) 04/08/2024 0542   HGB 15.4 (H) 04/08/2024 0542   HCT 44.2 04/08/2024 0542   PLT 297 04/08/2024 0542   MCV 79.4 (L) 04/08/2024 0542   MCH 27.6 04/08/2024 0542   MCHC 34.8 04/08/2024 0542   RDW 13.3 04/08/2024 0542   LYMPHSABS 2.1 04/08/2024 0542   MONOABS 0.8 04/08/2024 0542   EOSABS 0.3 04/08/2024 0542   BASOSABS 0.0 04/08/2024 0542    BMET    Component Value Date/Time   NA 139 04/08/2024 0542   NA 140 01/28/2020 0000   K 3.5 04/08/2024 0542   CL 104 04/08/2024 0542   CO2 20 (L) 04/08/2024 0542   GLUCOSE 103 (H) 04/08/2024 0542   BUN <5 (L) 04/08/2024 0542   BUN 13 01/28/2020 0000   CREATININE 0.73 04/08/2024 0542   CALCIUM  10.2 04/08/2024 0542   GFRNONAA >60 04/08/2024 0542   GFRAA >60 06/03/2020 0541    BNP No results found for: BNP  ProBNP No results found for: PROBNP  Imaging:   Administration History     None          Latest Ref Rng  & Units 11/20/2018   11:00 AM  PFT Results  FVC-Pre L 3.50   FVC-Predicted Pre % 86   FVC-Post L 3.16   FVC-Predicted Post % 78   Pre FEV1/FVC % % 85   Post FEV1/FCV % % 90   FEV1-Pre L 2.99   FEV1-Predicted Pre % 93   FEV1-Post L 2.84   DLCO uncorrected ml/min/mmHg 25.25   DLCO UNC% % 85   DLCO corrected ml/min/mmHg 24.33   DLCO COR %Predicted % 82   DLVA Predicted % 93   TLC L 4.99   TLC % Predicted % 88   RV % Predicted % 74     No results found for: NITRICOXIDE      Assessment & Plan:   Daytime sleepiness Daytime sleepiness, fatigue, weight gain, restless sleep, chronic insomnia, erythrocytosis all concerning for underlying sleep apnea.  Set patient up for home sleep study patient education given on sleep apnea  - discussed how weight can impact sleep and risk for sleep disordered breathing - discussed options to assist with weight loss: combination of diet modification, cardiovascular and strength training exercises   - had an extensive discussion regarding the adverse health consequences related to  untreated sleep disordered breathing - specifically discussed the risks for hypertension, coronary artery disease, cardiac dysrhythmias, cerebrovascular disease, and diabetes - lifestyle modification discussed   - discussed how sleep disruption can increase risk of accidents, particularly when driving - safe driving practices were discussed   Plan  Patient Instructions  Set up for home sleep study  Work on healthy weight loss  Do not drive if sleepy  Follow up in 6 weeks to discuss results and treatment plan -Virtual visit     Morbid obesity (HCC) Healthy weight loss discussed      Roena Clark, NP 04/15/2024

## 2024-04-16 ENCOUNTER — Ambulatory Visit: Attending: Internal Medicine | Admitting: Pharmacist

## 2024-04-16 DIAGNOSIS — Z79899 Other long term (current) drug therapy: Secondary | ICD-10-CM

## 2024-04-16 NOTE — Progress Notes (Signed)
   S: Patient presents today for review of their specialty medication.   Patient is currently taking Taltz  for psoriasis. Patient is managed by Dr. Martina Sledge for this.   Dosing: Psoriasis: SubQ: 160 mg once, followed by 80 mg at weeks 2, 4, 6, 8, 10, and 12, and then 80 mg every 4 weeks.   Adherence: confirms.   Efficacy: confirms. This medication is working quite well for her!   Monitoring:  S/sx infection: none Injection site reactions: some local irritation but nothing contraindicative at this time.  S/sx of IBD: none S/sx of hypersensitivity reaction: none  Current adverse effects: none   O:     Lab Results  Component Value Date   WBC 7.9 04/08/2024   HGB 15.4 (H) 04/08/2024   HCT 44.2 04/08/2024   MCV 79.4 (L) 04/08/2024   PLT 297 04/08/2024      Chemistry      Component Value Date/Time   NA 139 04/08/2024 0542   NA 140 01/28/2020 0000   K 3.5 04/08/2024 0542   CL 104 04/08/2024 0542   CO2 20 (L) 04/08/2024 0542   BUN <5 (L) 04/08/2024 0542   BUN 13 01/28/2020 0000   CREATININE 0.73 04/08/2024 0542   GLU 91 01/28/2020 0000      Component Value Date/Time   CALCIUM  10.2 04/08/2024 0542   ALKPHOS 64 04/08/2024 0542   AST 36 04/08/2024 0542   ALT 31 04/08/2024 0542   BILITOT 1.0 04/08/2024 0542   BILITOT 0.5 11/17/2018 0830       A/P: 1. Medication review: patient currently on Taltz  for psoriasis. Reviewed the medication with the patient, including the following: Taltz  is a medication used to treat ankylosing spondylitis, plaque psoriasis, and psoriatic arthritis. Subcutaneous: Allow to reach room temperature prior to injection (30 minutes). Do not shake. Inject full amount into the upper arms, thighs or any quadrant of the abdomen; administer each injection at a different anatomic location than a previous injection and avoid areas where the skin is tender, bruised, erythematous, indurated, or affected by psoriasis. Administration in the upper, outer arm may be  performed by a caregiver or health care provider. Ixekizumab  is intended for use under the guidance and supervision of a physician; may be self-injected by the patient following proper training in SubQ injection technique. Possible adverse effects include neutropenia, antibody development, increased risk of infection, injection site reaction, hypersensitivity reactions, and inflammatory bowel disease. Avoid live vaccinations. No recommendations for any changes at this time.  Marene Shape, PharmD, Becky Bowels, CPP Clinical Pharmacist [facility] [phone]

## 2024-04-19 ENCOUNTER — Ambulatory Visit: Admitting: Psychology

## 2024-04-19 DIAGNOSIS — F320 Major depressive disorder, single episode, mild: Secondary | ICD-10-CM

## 2024-04-19 NOTE — Progress Notes (Unsigned)
 PROGRESS NOTE:    Name: Olivia [name] Date: 04/19/2024 MRN: [id number] DOB: 1966-03-17 PCP: Luevenia Saha, MD  Time spent: 4:00 PM - 4:59 PM  Annual Review: 05/25/2024  Today I met with  Olivia [name] in remote video (Caregility) face-to-face individual psychotherapy.  Distance Site: [name]: Dr Durand Gift Remote Office Consent: Obtained verbal consent to transmit session remotely.   Patient is aware of the limitations inherent in participating in virtual therapy.   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 She is normally a high functioning and driven individual and it is a struggle to get motivated to do anything. If it is necessary for work she does it and does a good job, but it is not the same. She has gained 70 pounds in less than 9 months and it has impacted her self image. She is also peri menopausal and these hormonal changes are also likely to be contributing to her changing mood states.   A year and a half ago her father had quadrupel by-pass surgery and he was recently diagnosed with cancer. 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  Olivia [name] states that she was diagnosed with adult ADHD and responded well to medications.  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)   [name] he is on the ASD, he is a Holiday representative and attends [employer]   Mental Status Exam: Appearance:   Casual     Behavior:  Appropriate  Motor:  Normal  Speech/Language:   NA  Affect:  Appropriate  Mood:  normal  Thought process:  normal  Thought content:    WNL  Sensory/Perceptual disturbances:    WNL  Orientation:  oriented to person, place, time/date, situation, and day of week  Attention:  Good  Concentration:  Good  Memory:  WNL  Fund of knowledge:   Good  Insight:    Good  Judgment:   Good   Impulse Control:  Good    Individualized Treatment Plan                Strengths: bright, verbal, motivated, self aware, self advocate   Supports: spouse, family, friends, colleagues   Goal/Needs for Treatment:  In order of importance to patient  1) Develop healthy interpersonal relationships that lead to the alleviation and help prevent the relapse of depression.  2) Learn and implement coping skills that result in a reduction of anxiety and worry, and improved functioning.   3) Combine skills learned in therapy into a new daily approach to managing ADHD.   Client Statement of Needs: requests assistance with piecing back together her life as it once was before she got depressed (burnt out).   Treatment Level: Weekly Individual Outpatient Psychotherapy.  Symptoms: autonomic hyperactivity (e.g., palpitations, shortness of breath, dry mouth, trouble swallowing, nausea, diarrhea).  Childhood history of Attention Deficit Disorder (ADD) that was either diagnosed or later concluded due to the symptoms of behavioral problems at school, impulsivity, temper outbursts, and lack of concentration.  Depressed or irritable mood. Diminished interest in or enjoyment of activities. Easily distracted and drawn from task at hand.  Excessive and/or unrealistic worry that is difficult to control occurring more days than not for at least 6 months about a number of events or activities.  Feelings of hopelessness, worthlessness, or inappropriate guilt.  Lack  of energy.   Poor concentration and indecisiveness.  Restless and fidgety; unable to be sedentary for more than a short time.   Client Treatment Preferences: Continue with current therapist   Healthcare consumer's goal for treatment:  Psychologist, Elder Greening, Ph.D. will support the patient's ability to achieve the goals identified. Cognitive Behavioral Therapy, Dialectical Behavioral Therapy, Motivational Interviewing, SPACE, parent training,  and other evidenced-based practices will be used to promote progress towards healthy functioning.   Healthcare consumer Olivia [name] will: Actively participate in therapy, working towards healthy functioning.    *Justification for Continuation/Discontinuation of Goal: R=Revised, O=Ongoing, A=Achieved, D=Discontinued  Goal 1) Develop healthy interpersonal relationships that lead to the alleviation and help prevent the relapse of depression.  5 Point Likert rating baseline date: 05/28/2021 Target Date Goal Was reviewed Status Code Progress towards goal/Likert rating  05/29/2023 05/28/2022         O 3/5 - learning to set better limits and boundaries, still hasn't been able to regulate her daily routines  05/25/2024 05/26/2023         O        3.75 - pt reestablished friendship but has not be consistent in reaching out, has improved her relationship with her parents with appropriate limits.  Is more accepting of her relationship with her estranged daughter as healthy for her and not a failure        Goal 2) Learn and implement coping skills that result in a reduction of anxiety and worry, and improved daily functioning.  5 Point Likert rating baseline date: 05/28/2021  Target Date Goal Was reviewed Status Code Progress towards goal/Likert rating  05/29/2023 05/28/2022          O 2/5 - learning to use her skills more consistently  05/25/2024 05/26/2023          O 3.5 - pt is learning to use her skills and better manage her unrealistic and perfectionist tendencies that keep her stressed and anxious        Goal 3) Combine skills learned in therapy into a new daily approach to managing ADHD.  5 Point Likert rating baseline date: 05/28/2021  Target Date Goal Was reviewed Status Code Progress towards goal/Likert rating  05/29/2023 05/28/2022          O 2/5 - learning to use her skills more consistently  05/25/2024 05/26/2023          O 3.75 - accepted the need and began medication, has been more aware,  recognize, acknowledge and accept the issues related ADHD and is better able to apply strategies. Pt is kinder to herself around her limitations or need for an alternative way of approaching tasks        This plan has been reviewed and created by the following participants:  This plan will be reviewed at least every 12 months. Date Behavioral Olivia Clinician Date Guardian/Patient   05/28/2022 Elder Greening, Ph.D.   05/28/2022 Olivia [name]  05/26/2023 Elder Greening, Ph.D.  05/26/2023 Olivia [name]              Diagnosis: Major Depressive Disorder, recurrent, mild Generalized Anxiety Disorder Attention Deficit Disorder, predominantly inattentive   Olivia [name] reports that her daughter called to let her know that she would be going to [employer] graduation, and that she could now speak to her.  We d/e/p what occurred, how she responded, and selective memory.  I encouraged her to set up a time to talk to  her daughter about what happened, and setting boundaries for moving forward after two and a half years of not talking.  It was a very emotional weekend   Elder Greening, PhD  Son: Olivia [name] (17) he is on the ASD Friend - Olivia [name]  Super Ego - Olivia [name]

## 2024-04-23 ENCOUNTER — Other Ambulatory Visit (INDEPENDENT_AMBULATORY_CARE_PROVIDER_SITE_OTHER)

## 2024-04-23 DIAGNOSIS — Z79899 Other long term (current) drug therapy: Secondary | ICD-10-CM | POA: Diagnosis not present

## 2024-04-23 DIAGNOSIS — E890 Postprocedural hypothyroidism: Secondary | ICD-10-CM | POA: Diagnosis not present

## 2024-04-23 LAB — TSH: TSH: 0.01 u[IU]/mL — ABNORMAL LOW (ref 0.35–5.50)

## 2024-04-23 LAB — T4, FREE: Free T4: 1.02 ng/dL (ref 0.60–1.60)

## 2024-04-24 ENCOUNTER — Other Ambulatory Visit (HOSPITAL_BASED_OUTPATIENT_CLINIC_OR_DEPARTMENT_OTHER): Payer: Self-pay

## 2024-04-24 ENCOUNTER — Ambulatory Visit: Payer: Self-pay | Admitting: Family Medicine

## 2024-04-24 DIAGNOSIS — E890 Postprocedural hypothyroidism: Secondary | ICD-10-CM

## 2024-04-24 MED ORDER — LEVOTHYROXINE SODIUM 88 MCG PO TABS
88.0000 ug | ORAL_TABLET | Freq: Every day | ORAL | 3 refills | Status: DC
  Filled 2024-04-24 – 2024-05-04 (×2): qty 90, 90d supply, fill #0

## 2024-04-24 NOTE — Progress Notes (Signed)
 See mychart note Dear Olivia [name], You are still getting too much levothyroxine . I have decreased your dose down to 88mcg daily. Please start that and return in 6- 8 weeks again to recheck your levels.  Thanks! Sincerely, Dr. Jodie

## 2024-04-26 ENCOUNTER — Ambulatory Visit (INDEPENDENT_AMBULATORY_CARE_PROVIDER_SITE_OTHER): Admitting: Psychology

## 2024-04-26 DIAGNOSIS — F330 Major depressive disorder, recurrent, mild: Secondary | ICD-10-CM

## 2024-04-26 DIAGNOSIS — F900 Attention-deficit hyperactivity disorder, predominantly inattentive type: Secondary | ICD-10-CM | POA: Diagnosis not present

## 2024-04-26 DIAGNOSIS — F411 Generalized anxiety disorder: Secondary | ICD-10-CM | POA: Diagnosis not present

## 2024-04-26 DIAGNOSIS — F320 Major depressive disorder, single episode, mild: Secondary | ICD-10-CM
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
## 2024-04-27 LAB — T3, REVERSE: T3, Reverse: 25 ng/dL (ref 8–25)

## 2024-05-03 ENCOUNTER — Ambulatory Visit: Admitting: Psychology

## 2024-05-04 ENCOUNTER — Other Ambulatory Visit (HOSPITAL_COMMUNITY): Payer: Self-pay

## 2024-05-04 ENCOUNTER — Other Ambulatory Visit (HOSPITAL_BASED_OUTPATIENT_CLINIC_OR_DEPARTMENT_OTHER): Payer: Self-pay

## 2024-05-10 ENCOUNTER — Other Ambulatory Visit: Payer: Self-pay | Admitting: Pharmacy Technician

## 2024-05-10 ENCOUNTER — Ambulatory Visit (INDEPENDENT_AMBULATORY_CARE_PROVIDER_SITE_OTHER): Admitting: Psychology

## 2024-05-10 ENCOUNTER — Encounter (INDEPENDENT_AMBULATORY_CARE_PROVIDER_SITE_OTHER): Payer: Self-pay

## 2024-05-10 ENCOUNTER — Other Ambulatory Visit: Payer: Self-pay

## 2024-05-10 DIAGNOSIS — F330 Major depressive disorder, recurrent, mild: Secondary | ICD-10-CM | POA: Diagnosis not present

## 2024-05-10 DIAGNOSIS — F900 Attention-deficit hyperactivity disorder, predominantly inattentive type: Secondary | ICD-10-CM | POA: Diagnosis not present

## 2024-05-10 DIAGNOSIS — F411 Generalized anxiety disorder: Secondary | ICD-10-CM | POA: Diagnosis not present

## 2024-05-10 NOTE — Progress Notes (Signed)
 PROGRESS NOTE:    Name: Olivia [name] Date: 05/10/2024 MRN: [id number] DOB: 1965-12-25 PCP: Jodie Lavern CROME, MD  Time spent: 4:00 PM - 4:59 PM  Annual Review: 05/25/2024  Today I met with  Olivia [name] in remote video (Caregility) face-to-face individual psychotherapy.  Distance Site: [name]: Dr Edison Remote Office Consent: Obtained verbal consent to transmit session remotely.   Patient is aware of the limitations inherent in participating in virtual therapy.   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 She is normally a high functioning and driven individual and it is a struggle to get motivated to do anything. If it is necessary for work she does it and does a good job, but it is not the same. She has gained 70 pounds in less than 9 months and it has impacted her self image. She is also peri menopausal and these hormonal changes are also likely to be contributing to her changing mood states.   A year and a half ago her father had quadrupel by-pass surgery and he was recently diagnosed with cancer. 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  Verlisa states that she was diagnosed with adult ADHD and responded well to medications.  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)   Son: Health visitor (15) he is on the ASD, he is a Holiday representative and attends [employer]   Mental Status Exam: Appearance:   Casual     Behavior:  Appropriate  Motor:  Normal  Speech/Language:   NA  Affect:  Appropriate  Mood:  normal  Thought process:  normal  Thought content:    WNL  Sensory/Perceptual disturbances:    WNL  Orientation:  oriented to person, place, time/date, situation, and day of week  Attention:  Good  Concentration:  Good  Memory:  WNL  Fund of knowledge:   Good  Insight:     Good  Judgment:   Good  Impulse Control:  Good    Individualized Treatment Plan                Strengths: bright, verbal, motivated, self aware, self advocate   Supports: spouse, family, friends, colleagues   Goal/Needs for Treatment:  In order of importance to patient  1) Develop healthy interpersonal relationships that lead to the alleviation and help prevent the relapse of depression.  2) Learn and implement coping skills that result in a reduction of anxiety and worry, and improved functioning.   3) Combine skills learned in therapy into a new daily approach to managing ADHD.   Client Statement of Needs: requests assistance with piecing back together her life as it once was before she got depressed (burnt out).   Treatment Level: Weekly Individual Outpatient Psychotherapy.  Symptoms: autonomic hyperactivity (e.g., palpitations, shortness of breath, dry mouth, trouble swallowing, nausea, diarrhea).  Childhood history of Attention Deficit Disorder (ADD) that was either diagnosed or later concluded due to the symptoms of behavioral problems at school, impulsivity, temper outbursts, and lack of concentration.  Depressed or irritable mood. Diminished interest in or enjoyment of activities. Easily distracted and drawn from task at hand.  Excessive and/or unrealistic worry that is difficult to control occurring more days than not for at least 6 months about a  number of events or activities.  Feelings of hopelessness, worthlessness, or inappropriate guilt.  Lack of energy.   Poor concentration and indecisiveness.  Restless and fidgety; unable to be sedentary for more than a short time.   Client Treatment Preferences: Continue with current therapist   Healthcare consumer's goal for treatment:  Psychologist, Ronal Jenkins Sprung, Ph.D. will support the patient's ability to achieve the goals identified. Cognitive Behavioral Therapy, Dialectical Behavioral Therapy, Motivational Interviewing,  SPACE, parent training, and other evidenced-based practices will be used to promote progress towards healthy functioning.   Healthcare consumer Fatou Dunnigan will: Actively participate in therapy, working towards healthy functioning.    *Justification for Continuation/Discontinuation of Goal: R=Revised, O=Ongoing, A=Achieved, D=Discontinued  Goal 1) Develop healthy interpersonal relationships that lead to the alleviation and help prevent the relapse of depression.  5 Point Likert rating baseline date: 05/28/2021 Target Date Goal Was reviewed Status Code Progress towards goal/Likert rating  05/29/2023 05/28/2022         O 3/5 - learning to set better limits and boundaries, still hasn't been able to regulate her daily routines  05/25/2024 05/26/2023         O        3.75 - pt reestablished friendship but has not be consistent in reaching out, has improved her relationship with her parents with appropriate limits.  Is more accepting of her relationship with her estranged daughter as healthy for her and not a failure        Goal 2) Learn and implement coping skills that result in a reduction of anxiety and worry, and improved daily functioning.  5 Point Likert rating baseline date: 05/28/2021  Target Date Goal Was reviewed Status Code Progress towards goal/Likert rating  05/29/2023 05/28/2022          O 2/5 - learning to use her skills more consistently  05/25/2024 05/26/2023          O 3.5 - pt is learning to use her skills and better manage her unrealistic and perfectionist tendencies that keep her stressed and anxious        Goal 3) Combine skills learned in therapy into a new daily approach to managing ADHD.  5 Point Likert rating baseline date: 05/28/2021  Target Date Goal Was reviewed Status Code Progress towards goal/Likert rating  05/29/2023 05/28/2022          O 2/5 - learning to use her skills more consistently  05/25/2024 05/26/2023          O 3.75 - accepted the need and began  medication, has been more aware, recognize, acknowledge and accept the issues related ADHD and is better able to apply strategies. Pt is kinder to herself around her limitations or need for an alternative way of approaching tasks        This plan has been reviewed and created by the following participants:  This plan will be reviewed at least every 12 months. Date Behavioral Health Clinician Date Guardian/Patient   05/28/2022 Ronal Jenkins Sprung, Ph.D.   05/28/2022 Olivia [name] [name]  05/26/2023 Ronal Jenkins Sprung, Ph.D.  05/26/2023 Olivia [name] [name]              Diagnosis: Major Depressive Disorder, recurrent, mild Generalized Anxiety Disorder Attention Deficit Disorder, predominantly inattentive   [name] reports that she enjoyed her birthday trip to [place] to attend her favorite [employer] group.  We d/p how overwhemingly happy she was, never allowing herself these kinds of luxuries, and how  she is shifting her perspective about life.  [name] states that her father has surgery tomorrow and she will be accompany him and babysit her mother all the while.  We d/p past experiences, and the stress she is anticipating.  I provided the support and guidance she needed.   Ronal Jenkins Sprung, PhD  Son: Spurgeon (17) he is on the ASD Friend - Amy  Super Ego - Dickey

## 2024-05-10 NOTE — Progress Notes (Signed)
 Specialty Pharmacy Refill Coordination Note  Olivia [name] is a 58 y.o. female contacted today regarding refills of specialty medication(s) Ixekizumab  (Taltz )   Patient requested (Patient-Rptd) Delivery   Delivery date: 05/20/24 Verified address: (Patient-Rptd) [address]   Medication will be filled on 05/19/24.

## 2024-05-17 ENCOUNTER — Ambulatory Visit: Admitting: Psychology

## 2024-05-19 ENCOUNTER — Other Ambulatory Visit: Payer: Self-pay

## 2024-05-20 ENCOUNTER — Other Ambulatory Visit (HOSPITAL_BASED_OUTPATIENT_CLINIC_OR_DEPARTMENT_OTHER): Payer: Self-pay

## 2024-05-24 ENCOUNTER — Ambulatory Visit (INDEPENDENT_AMBULATORY_CARE_PROVIDER_SITE_OTHER): Admitting: Psychology

## 2024-05-24 ENCOUNTER — Telehealth: Admitting: Physician Assistant

## 2024-05-24 ENCOUNTER — Ambulatory Visit: Payer: Self-pay

## 2024-05-24 DIAGNOSIS — F331 Major depressive disorder, recurrent, moderate: Secondary | ICD-10-CM | POA: Diagnosis not present

## 2024-05-24 DIAGNOSIS — F411 Generalized anxiety disorder: Secondary | ICD-10-CM

## 2024-05-24 DIAGNOSIS — R3989 Other symptoms and signs involving the genitourinary system: Secondary | ICD-10-CM

## 2024-05-24 DIAGNOSIS — F900 Attention-deficit hyperactivity disorder, predominantly inattentive type: Secondary | ICD-10-CM | POA: Diagnosis not present

## 2024-05-24 MED ORDER — NITROFURANTOIN MONOHYD MACRO 100 MG PO CAPS: 100.0000 mg | ORAL_CAPSULE | Freq: Two times a day (BID) | ORAL | 0 refills | Status: DC

## 2024-05-24 NOTE — Telephone Encounter (Signed)
 Called and informed pt of E-visit feature on mychart and how to operate. Pt verbalized understanding.

## 2024-05-24 NOTE — Progress Notes (Signed)

## 2024-05-24 NOTE — Progress Notes (Unsigned)
 PROGRESS NOTE:    Name: Olivia [name] Date: 05/24/2024 MRN: [id number] DOB: 10/26/1966 PCP: Jodie Lavern CROME, MD  Time spent: 4:00 PM - 4:59 PM  Annual Review: 05/25/2024  Today I met with  Olivia [name] in remote video (Caregility) face-to-face individual psychotherapy.  Distance Site: [name]: Dr Edison Remote Office Consent: Obtained verbal consent to transmit session remotely.   Patient is aware of the limitations inherent in participating in virtual therapy.   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 She is normally a high functioning and driven individual and it is a struggle to get motivated to do anything. If it is necessary for work she does it and does a good job, but it is not the same. She has gained 70 pounds in less than 9 months and it has impacted her self image. She is also peri menopausal and these hormonal changes are also likely to be contributing to her changing mood states.   A year and a half ago her father had quadrupel by-pass surgery and he was recently diagnosed with cancer. 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  Olivia [name] states that she was diagnosed with adult ADHD and responded well to medications.  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)   [name] he is on the ASD, he is a Holiday representative and attends [employer]   Mental Status Exam: Appearance:   Casual     Behavior:  Appropriate  Motor:  Normal  Speech/Language:   NA  Affect:  Appropriate  Mood:  normal  Thought process:  normal  Thought content:    WNL  Sensory/Perceptual disturbances:    WNL  Orientation:  oriented to person, place, time/date, situation, and day of week  Attention:  Good  Concentration:  Good  Memory:  WNL  Fund of knowledge:   Good  Insight:    Good   Judgment:   Good  Impulse Control:  Good    Individualized Treatment Plan                Strengths: bright, verbal, motivated, self aware, self advocate   Supports: spouse, family, friends, colleagues   Goal/Needs for Treatment:  In order of importance to patient  1) Develop healthy interpersonal relationships that lead to the alleviation and help prevent the relapse of depression.  2) Learn and implement coping skills that result in a reduction of anxiety and worry, and improved functioning.   3) Combine skills learned in therapy into a new daily approach to managing ADHD.   Client Statement of Needs: requests assistance with piecing back together her life as it once was before she got depressed (burnt out).   Treatment Level: Weekly Individual Outpatient Psychotherapy.  Symptoms: autonomic hyperactivity (e.g., palpitations, shortness of breath, dry mouth, trouble swallowing, nausea, diarrhea).  Childhood history of Attention Deficit Disorder (ADD) that was either diagnosed or later concluded due to the symptoms of behavioral problems at school, impulsivity, temper outbursts, and lack of concentration.  Depressed or irritable mood. Diminished interest in or enjoyment of activities. Easily distracted and drawn from task at hand.  Excessive and/or unrealistic worry that is difficult to control occurring more days than not for at least 6 months about a number of events or  activities.  Feelings of hopelessness, worthlessness, or inappropriate guilt.  Lack of energy.   Poor concentration and indecisiveness.  Restless and fidgety; unable to be sedentary for more than a short time.   Client Treatment Preferences: Continue with current therapist   Healthcare consumer's goal for treatment:  Psychologist, Ronal Jenkins Sprung, Ph.D. will support the patient's ability to achieve the goals identified. Cognitive Behavioral Therapy, Dialectical Behavioral Therapy, Motivational Interviewing, SPACE,  parent training, and other evidenced-based practices will be used to promote progress towards healthy functioning.   Healthcare consumer Olivia [name] will: Actively participate in therapy, working towards healthy functioning.    *Justification for Continuation/Discontinuation of Goal: R=Revised, O=Ongoing, A=Achieved, D=Discontinued  Goal 1) Develop healthy interpersonal relationships that lead to the alleviation and help prevent the relapse of depression.  5 Point Likert rating baseline date: 05/28/2021 Target Date Goal Was reviewed Status Code Progress towards goal/Likert rating  05/29/2023 05/28/2022         O 3/5 - learning to set better limits and boundaries, still hasn't been able to regulate her daily routines  05/25/2024 05/26/2023         O        3.75 - pt reestablished friendship but has not be consistent in reaching out, has improved her relationship with her parents with appropriate limits.  Is more accepting of her relationship with her estranged daughter as healthy for her and not a failure        Goal 2) Learn and implement coping skills that result in a reduction of anxiety and worry, and improved daily functioning.  5 Point Likert rating baseline date: 05/28/2021  Target Date Goal Was reviewed Status Code Progress towards goal/Likert rating  05/29/2023 05/28/2022          O 2/5 - learning to use her skills more consistently  05/25/2024 05/26/2023          O 3.5 - pt is learning to use her skills and better manage her unrealistic and perfectionist tendencies that keep her stressed and anxious        Goal 3) Combine skills learned in therapy into a new daily approach to managing ADHD.  5 Point Likert rating baseline date: 05/28/2021  Target Date Goal Was reviewed Status Code Progress towards goal/Likert rating  05/29/2023 05/28/2022          O 2/5 - learning to use her skills more consistently  05/25/2024 05/26/2023          O 3.75 - accepted the need and began medication, has  been more aware, recognize, acknowledge and accept the issues related ADHD and is better able to apply strategies. Pt is kinder to herself around her limitations or need for an alternative way of approaching tasks        This plan has been reviewed and created by the following participants:  This plan will be reviewed at least every 12 months. Date Behavioral Olivia Clinician Date Guardian/Patient   05/28/2022 Ronal Jenkins Sprung, Ph.D.   05/28/2022 Olivia Olivia [name] [name]  05/26/2023 Ronal Jenkins Sprung, Ph.D.  05/26/2023 Olivia Olivia [name] [name]              Diagnosis: Major Depressive Disorder, recurrent, mild Generalized Anxiety Disorder Attention Deficit Disorder, predominantly inattentive   Olivia [name] reports that her father had his 6th surgical procedure this year.  We d/p the stress of dealing with her mother who insisted on going to the hospital, and was contrary and combative.  We also d/e/p  a big issue with her sister who feels entitled to her parents finances, longstanding family dynamics, and of the need to set limits on her sister (as her parents POA.)  Olivia [name] c/o of being sick again (UTI) and feeling miserable.  I made the connection to her frequent illnesses and her high level of stress.  We d/p ways to attempt to minimize her stress and focus on better self-care.  Ronal Jenkins Sprung, PhD  Son: Olivia [name] (17) he is on the ASD Friend - Amy  Super [employer]go - Dickey

## 2024-05-24 NOTE — Telephone Encounter (Signed)
 FYI Only or Action Required?: Action required by provider: patient would like an antibiotic sent but is willing to be seen in office. Needs follow up phone call.  Patient was last seen in primary care on 03/09/2024 by Jodie Lavern CROME, MD.  Called Nurse Triage reporting Urinary Frequency.  Symptoms began several days ago.  Interventions attempted: Rest, hydration, or home remedies.  Symptoms are: unchanged.  Triage Disposition: See Physician Within 24 Hours-needs a call back from staff  Patient/caregiver understands and will follow disposition?: Yes  Copied from CRM [id number]. Topic: Clinical - Red Word Triage >> May 24, 2024  8:44 AM Charlet HERO wrote: Red Word that prompted transfer to Nurse Triage: Patient is calling bc she believes she has uti frequent urination she is having spasms with burning after she urinate. Reason for Disposition  Urinating more frequently than usual (i.e., frequency) OR new-onset of the feeling of an urgent need to urinate (i.e., urgency)  Answer Assessment - Initial Assessment Questions 1. SYMPTOM: What's the main symptom you're concerned about? (e.g., frequency, incontinence)     Frequent urination 2. ONSET: When did the  frequent urination  start?     Started last week 3. PAIN: Is there any pain? If Yes, ask: How bad is it? (Scale: 1-10; mild, moderate, severe)     5-6 out of 10 when having the spasms.  4. CAUSE: What do you think is causing the symptoms?     UTI 5. OTHER SYMPTOMS: Do you have any other symptoms? (e.g., blood in urine, fever, flank pain, pain with urination)     Pain with urination, bladder spasms  Patient is a Publishing rights manager. Patient states symptoms started last week but she thought it was something else. Patient would prefer if Dr. Jodie can send an antibiotic in but patient states if she needs to be seen in the office she will. Asking for a phone call back from staff.  Protocols used: Urinary Symptoms-A-AH

## 2024-05-31 ENCOUNTER — Ambulatory Visit (INDEPENDENT_AMBULATORY_CARE_PROVIDER_SITE_OTHER): Admitting: Psychology

## 2024-05-31 DIAGNOSIS — F411 Generalized anxiety disorder: Secondary | ICD-10-CM | POA: Diagnosis not present

## 2024-05-31 DIAGNOSIS — F900 Attention-deficit hyperactivity disorder, predominantly inattentive type: Secondary | ICD-10-CM

## 2024-05-31 DIAGNOSIS — F330 Major depressive disorder, recurrent, mild: Secondary | ICD-10-CM | POA: Diagnosis not present

## 2024-05-31 NOTE — Progress Notes (Signed)
 PROGRESS NOTE: Annual Review   Name: Olivia [name] Date: 05/31/2024 MRN: [id number] DOB: 1965/12/13 PCP: Olivia Lavern CROME, MD  Time spent: 4:00 PM - 4:59 PM  Annual Review: 05/25/2024  Today I met with  Olivia [name] in remote video (Caregility) face-to-face individual psychotherapy.  Distance Site: [name]: Dr Edison Remote Office Consent: Obtained verbal consent to transmit session remotely.   Patient is aware of the limitations inherent in participating in virtual therapy.   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 She is normally a high functioning and driven individual and it is a struggle to get motivated to do anything. If it is necessary for work she does it and does a good job, but it is not the same. She has gained 70 pounds in less than 9 months and it has impacted her self image. She is also peri menopausal and these hormonal changes are also likely to be contributing to her changing mood states.   Background History: Olivia [name] is contending with aging parents.  Her mother was diagnosed this year with vascular dementia, and her father had quadrupel by-pass surgery nearly two years ago, but continues to have other medical complications, and was most recently diagnosed with cancer. She is the medical point person for her family.  She is the main person who is supporting and providing guidance to her aging parents.  Olivia [name] has one sister, but her sister struggles to manage her own life and that of her daughter.   Olivia [name] states that she was diagnosed with adult ADHD and responded well to medications.  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)   Olivia [name]: Olivia [name] (17) he is on the ASD, he is a Holiday representative and attends [employer]   Mental Status Exam: Appearance:   Casual     Behavior:  Appropriate   Motor:  Normal  Speech/Language:   NA  Affect:  Appropriate  Mood:  normal  Thought process:  normal  Thought content:    WNL  Sensory/Perceptual disturbances:    WNL  Orientation:  oriented to person, place, time/date, situation, and day of week  Attention:  Good  Concentration:  Good  Memory:  WNL  Fund of knowledge:   Good  Insight:    Good  Judgment:   Good  Impulse Control:  Good        Individualized Treatment Plan                Strengths: bright, verbal, motivated, self aware, self advocate   Supports: spouse, family, friends, colleagues   Goal/Needs for Treatment:  In order of importance to patient  1) Develop healthy interpersonal relationships that lead to the alleviation and help prevent the relapse of depression.  2) Learn and implement coping skills that result in a reduction of anxiety and worry, and improved functioning.   3) Combine skills learned in therapy into a new daily approach to managing ADHD.   Client Statement of Needs: requests assistance with piecing back together her life as it once was before she got depressed (burnt out).   Treatment Level: Weekly Individual Outpatient Psychotherapy.  Symptoms: autonomic hyperactivity (e.g., palpitations, shortness of breath, dry mouth, trouble swallowing, nausea, diarrhea).  Childhood history of Attention Deficit Disorder (ADD) that was either diagnosed or later concluded due to the symptoms of behavioral problems at  school, impulsivity, temper outbursts, and lack of concentration.  Depressed or irritable mood. Diminished interest in or enjoyment of activities. Easily distracted and drawn from task at hand.  Excessive and/or unrealistic worry that is difficult to control occurring more days than not for at least 6 months about a number of events or activities.  Feelings of hopelessness, worthlessness, or inappropriate guilt.  Lack of energy.   Poor concentration and indecisiveness.  Restless and  fidgety; unable to be sedentary for more than a short time.   Client Treatment Preferences: Continue with current therapist   Healthcare consumer's goal for treatment:  Psychologist, Olivia [name], Ph.D. will support the patient's ability to achieve the goals identified. Cognitive Behavioral Therapy, Dialectical Behavioral Therapy, Motivational Interviewing, SPACE, parent training, and other evidenced-based practices will be used to promote progress towards healthy functioning.   Healthcare consumer Olivia [name] will: Actively participate in therapy, working towards healthy functioning.    *Justification for Continuation/Discontinuation of Goal: R=Revised, O=Ongoing, A=Achieved, D=Discontinued  Goal 1) Develop healthy interpersonal relationships that lead to the alleviation and help prevent the relapse of depression.  5 Point Likert rating baseline date: 05/28/2021 Target Date Goal Was reviewed Status Code Progress towards goal/Likert rating  05/29/2023 05/28/2022         O 3/5 - learning to set better limits and boundaries, still hasn't been able to regulate her daily routines  05/25/2024 05/26/2023         O        3.75 - pt reestablished friendship but has not be consistent in reaching out, has improved her relationship with her parents with appropriate limits.  Is more accepting of her relationship with her estranged daughter as healthy for her and not a failure   05/31/2025 05/31/2024  4/5 - pt has established some new friendships, and is better at planning social events in order to spend time with these new friends   Goal 2) Learn and implement coping skills that result in a reduction of anxiety and worry, and improved daily functioning.  5 Point Likert rating baseline date: 05/28/2021  Target Date Goal Was reviewed Status Code Progress towards goal/Likert rating  05/29/2023 05/28/2022          O 2/5 - learning to use her skills more consistently  05/25/2024 05/26/2023          O 3.5 -  pt is learning to use her skills and better manage her unrealistic and perfectionist tendencies that keep her stressed and anxious  05/31/2025 05/31/2024          O 4/5 - pt is able to head off the anxiety spirals that used to over take her,     Goal 3) Combine skills learned in therapy into a new daily approach to managing ADHD.  5 Point Likert rating baseline date: 05/28/2021  Target Date Goal Was reviewed Status Code Progress towards goal/Likert rating  05/29/2023 05/28/2022          O 2/5 - learning to use her skills more consistently  05/25/2024 05/26/2023          O 3.75 - accepted the need and began medication, has been more aware, recognize, acknowledge and accept the issues related ADHD and is better able to apply strategies. Pt is kinder to herself around her limitations or need for an alternative way of approaching tasks  05/31/2025 05/31/2024          O 3.25/5 - pt feels like she has regressed  some, returning to past patterns of avoidance and procrastination   This plan has been reviewed and created by the following participants:  This plan will be reviewed at least every 12 months. Date Behavioral Olivia Clinician Date Guardian/Patient   05/28/2022 Olivia [name], Ph.D.   05/28/2022 Olivia [name]  05/26/2023 Olivia [name], Ph.D.  05/26/2023 Olivia [name]  05/31/2024 Olivia [name], Ph.D. 05/31/2024 Olivia [name]         Diagnosis: Major Depressive Disorder, recurrent, mild Generalized Anxiety Disorder Attention Deficit Disorder, predominantly inattentive   In session today, we conducted pt's annual review.  We reviewed Olivia [name] progress, d/ goals and updated her treatment plan.  She actively participated in the creation of her treatment plan and freely gave her consent.  Pt was told to expect an electronic document indicating that we reviewed her progress in therapy, updated their goals, set new goals as was appropriate to their needs which required her  signature.  Olivia Jenkins Sprung, PhD  Olivia [name]: Olivia [name] (17) he is on the ASD Friend - Olivia [name]  Super Ego - Dickey

## 2024-06-07 ENCOUNTER — Ambulatory Visit (INDEPENDENT_AMBULATORY_CARE_PROVIDER_SITE_OTHER): Admitting: Psychology

## 2024-06-07 DIAGNOSIS — F900 Attention-deficit hyperactivity disorder, predominantly inattentive type: Secondary | ICD-10-CM

## 2024-06-07 DIAGNOSIS — F411 Generalized anxiety disorder: Secondary | ICD-10-CM

## 2024-06-07 DIAGNOSIS — F320 Major depressive disorder, single episode, mild: Secondary | ICD-10-CM

## 2024-06-07 DIAGNOSIS — F330 Major depressive disorder, recurrent, mild: Secondary | ICD-10-CM

## 2024-06-07 NOTE — Progress Notes (Signed)
 PROGRESS NOTE:   Name: Olivia [name] Date: 06/07/2024 MRN: [id number] DOB: May 14, 1966 PCP: Jodie Lavern CROME, MD  Time spent: 4:00 PM - 4:59 PM  Annual Review: 05/31/2025  Today I met with  Olivia [name] in remote video (Caregility) face-to-face individual psychotherapy.  Distance Site: [name]: Dr Edison Remote Office Consent: Obtained verbal consent to transmit session remotely.   Patient is aware of the limitations inherent in participating in virtual therapy.   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 She is normally a high functioning and driven individual and it is a struggle to get motivated to do anything. If it is necessary for work she does it and does a good job, but it is not the same. She has gained 70 pounds in less than 9 months and it has impacted her self image. She is also peri menopausal and these hormonal changes are also likely to be contributing to her changing mood states.   Background History: Olivia [name] is contending with aging parents.  Her mother was diagnosed this year with vascular dementia, and her father had quadrupel by-pass surgery nearly two years ago, but continues to have other medical complications, and was most recently diagnosed with cancer. She is the medical point person for her family.  She is the main person who is supporting and providing guidance to her aging parents.  Olivia [name] has one sister, but her sister struggles to manage her own life and that of her daughter.   Olivia [name] states that she was diagnosed with adult ADHD and responded well to medications.  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)   Son: Health visitor (17) he is on the ASD, he is a Holiday representative and attends [employer]   Mental Status Exam: Appearance:   Casual     Behavior:  Appropriate  Motor:  Normal   Speech/Language:   NA  Affect:  Appropriate  Mood:  normal  Thought process:  normal  Thought content:    WNL  Sensory/Perceptual disturbances:    WNL  Orientation:  oriented to person, place, time/date, situation, and day of week  Attention:  Good  Concentration:  Good  Memory:  WNL  Fund of knowledge:   Good  Insight:    Good  Judgment:   Good  Impulse Control:  Good        Individualized Treatment Plan                Strengths: bright, verbal, motivated, self aware, self advocate   Supports: spouse, family, friends, colleagues   Goal/Needs for Treatment:  In order of importance to patient  1) Develop healthy interpersonal relationships that lead to the alleviation and help prevent the relapse of depression.  2) Learn and implement coping skills that result in a reduction of anxiety and worry, and improved functioning.   3) Combine skills learned in therapy into a new daily approach to managing ADHD.   Client Statement of Needs: requests assistance with piecing back together her life as it once was before she got depressed (burnt out).   Treatment Level: Weekly Individual Outpatient Psychotherapy.  Symptoms: autonomic hyperactivity (e.g., palpitations, shortness of breath, dry mouth, trouble swallowing, nausea, diarrhea).  Childhood history of Attention Deficit Disorder (ADD) that was either diagnosed or later concluded due to the symptoms of behavioral problems at school, impulsivity,  temper outbursts, and lack of concentration.  Depressed or irritable mood. Diminished interest in or enjoyment of activities. Easily distracted and drawn from task at hand.  Excessive and/or unrealistic worry that is difficult to control occurring more days than not for at least 6 months about a number of events or activities.  Feelings of hopelessness, worthlessness, or inappropriate guilt.  Lack of energy.   Poor concentration and indecisiveness.  Restless and fidgety; unable to be  sedentary for more than a short time.   Client Treatment Preferences: Continue with current therapist   Healthcare consumer's goal for treatment:  Psychologist, Ronal Jenkins Sprung, Ph.D. will support the patient's ability to achieve the goals identified. Cognitive Behavioral Therapy, Dialectical Behavioral Therapy, Motivational Interviewing, SPACE, parent training, and other evidenced-based practices will be used to promote progress towards healthy functioning.   Healthcare consumer Olivia [name] will: Actively participate in therapy, working towards healthy functioning.    *Justification for Continuation/Discontinuation of Goal: R=Revised, O=Ongoing, A=Achieved, D=Discontinued  Goal 1) Develop healthy interpersonal relationships that lead to the alleviation and help prevent the relapse of depression.  5 Point Likert rating baseline date: 05/28/2021 Target Date Goal Was reviewed Status Code Progress towards goal/Likert rating  05/29/2023 05/28/2022         O 3/5 - learning to set better limits and boundaries, still hasn't been able to regulate her daily routines  05/25/2024 05/26/2023         O        3.75 - pt reestablished friendship but has not be consistent in reaching out, has improved her relationship with her parents with appropriate limits.  Is more accepting of her relationship with her estranged daughter as healthy for her and not a failure   05/31/2025 05/31/2024  4/5 - pt has established some new friendships, and is better at planning social events in order to spend time with these new friends   Goal 2) Learn and implement coping skills that result in a reduction of anxiety and worry, and improved daily functioning.  5 Point Likert rating baseline date: 05/28/2021  Target Date Goal Was reviewed Status Code Progress towards goal/Likert rating  05/29/2023 05/28/2022          O 2/5 - learning to use her skills more consistently  05/25/2024 05/26/2023          O 3.5 - pt is learning to use  her skills and better manage her unrealistic and perfectionist tendencies that keep her stressed and anxious  05/31/2025 05/31/2024          O 4/5 - pt is able to head off the anxiety spirals that used to over take her,     Goal 3) Combine skills learned in therapy into a new daily approach to managing ADHD.  5 Point Likert rating baseline date: 05/28/2021  Target Date Goal Was reviewed Status Code Progress towards goal/Likert rating  05/29/2023 05/28/2022          O 2/5 - learning to use her skills more consistently  05/25/2024 05/26/2023          O 3.75 - accepted the need and began medication, has been more aware, recognize, acknowledge and accept the issues related ADHD and is better able to apply strategies. Pt is kinder to herself around her limitations or need for an alternative way of approaching tasks  05/31/2025 05/31/2024          O 3.25/5 - pt feels like she has regressed some, returning  to past patterns of avoidance and procrastination   This plan has been reviewed and created by the following participants:  This plan will be reviewed at least every 12 months. Date Behavioral Health Clinician Date Guardian/Patient   05/28/2022 Ronal Jenkins Sprung, Ph.D.   05/28/2022 Olivia [name]  05/26/2023 Ronal Jenkins Sprung, Ph.D.  05/26/2023 Olivia [name]  05/31/2024 Ronal Jenkins Sprung, Ph.D. 05/31/2024 Olivia [name]         Diagnosis: Major Depressive Disorder, recurrent, mild Generalized Anxiety Disorder Attention Deficit Disorder, predominantly inattentive   Olivia [name] reports that she is still struggling this week with lower back pain.  We d/ pain management and the ways she is trying to take it easy in order to heal.  Her biggest stress this week has been her aging parents.  We d/p a conversation she needed to have with her father, as their executor, about how he/they wished things be managed in the case of their deaths, concerns about managing her sister's discontent, as well as other  related concerns.  Lastly, we d/p that her son Olivia [name] will be starting college soon, observing the ways he is become a young Olivia, and ways to support/encourage his growing sense of independence.  Ronal Jenkins Sprung, PhD  Son: Olivia [name] (17) he is on the ASD Friend - Amy  Super Ego - Dickey

## 2024-06-09 ENCOUNTER — Encounter

## 2024-06-09 ENCOUNTER — Ambulatory Visit: Admitting: Family Medicine

## 2024-06-09 DIAGNOSIS — G4719 Other hypersomnia: Secondary | ICD-10-CM

## 2024-06-11 ENCOUNTER — Other Ambulatory Visit: Payer: Self-pay

## 2024-06-11 ENCOUNTER — Other Ambulatory Visit: Payer: Self-pay | Admitting: Pharmacist

## 2024-06-11 MED ORDER — TALTZ 80 MG/ML ~~LOC~~ SOAJ
SUBCUTANEOUS | 5 refills | Status: DC
  Filled 2024-06-11: qty 1, 28d supply, fill #0
  Filled 2024-07-06: qty 1, 28d supply, fill #1
  Filled 2024-08-06: qty 1, 28d supply, fill #2
  Filled 2024-08-31 – 2024-09-02 (×2): qty 1, 28d supply, fill #3
  Filled 2024-09-28: qty 1, 28d supply, fill #4
  Filled 2024-11-02 – 2024-11-09 (×2): qty 1, 28d supply, fill #5

## 2024-06-11 NOTE — Progress Notes (Signed)
 Specialty Pharmacy Refill Coordination Note  MADESYN AST is a 58 y.o. female contacted today regarding refills of specialty medication(s) Ixekizumab  (Taltz )   Patient requested Delivery   Delivery date: 06/16/24   Verified address: [address] [place], [place], [address]   Medication will be filled on 06/15/24.   Prescription on file from original provider. Sent to [facility] for rewrite. [place] when received.

## 2024-06-14 ENCOUNTER — Ambulatory Visit (INDEPENDENT_AMBULATORY_CARE_PROVIDER_SITE_OTHER): Admitting: Psychology

## 2024-06-14 ENCOUNTER — Encounter (INDEPENDENT_AMBULATORY_CARE_PROVIDER_SITE_OTHER): Payer: Self-pay

## 2024-06-14 ENCOUNTER — Other Ambulatory Visit (HOSPITAL_COMMUNITY): Payer: Self-pay

## 2024-06-14 DIAGNOSIS — F320 Major depressive disorder, single episode, mild: Secondary | ICD-10-CM

## 2024-06-14 DIAGNOSIS — F411 Generalized anxiety disorder: Secondary | ICD-10-CM

## 2024-06-14 DIAGNOSIS — F900 Attention-deficit hyperactivity disorder, predominantly inattentive type: Secondary | ICD-10-CM

## 2024-06-14 NOTE — Progress Notes (Signed)
 PROGRESS NOTE:   Name: Olivia [name] Date: 06/14/2024 MRN: [id number] DOB: [birth year] PCP: Jodie Lavern CROME, MD  Time spent: 4:00 PM - 4:58 PM  Annual Review: 05/31/2025  Today I met with  Olivia [name] in remote video (Caregility) face-to-face individual psychotherapy.  Distance Site: [name]: Dr Edison Remote Office Consent: Obtained verbal consent to transmit session remotely.   Patient is aware of the limitations inherent in participating in virtual therapy.   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 She is normally a high functioning and driven individual and it is a struggle to get motivated to do anything. If it is necessary for work she does it and does a good job, but it is not the same. She has gained 70 pounds in less than 9 months and it has impacted her self image. She is also peri menopausal and these hormonal changes are also likely to be contributing to her changing mood states.   Background History: Olivia [name] is contending with aging parents.  Her mother was diagnosed this year with vascular dementia, and her father had quadrupel by-pass surgery nearly two years ago, but continues to have other medical complications, and was most recently diagnosed with cancer. She is the medical point person for her family.  She is the main person who is supporting and providing guidance to her aging parents.  Olivia [name] has one sister, but her sister struggles to manage her own life and that of her daughter.   Isys states that she was diagnosed with adult ADHD and responded well to medications.  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)   Son: Health visitor (17) he is on the ASD, he is a Holiday representative and attends [employer]   Mental Status Exam: Appearance:   Casual     Behavior:  Appropriate  Motor:  Normal   Speech/Language:   NA  Affect:  Appropriate  Mood:  normal  Thought process:  normal  Thought content:    WNL  Sensory/Perceptual disturbances:    WNL  Orientation:  oriented to person, place, time/date, situation, and day of week  Attention:  Good  Concentration:  Good  Memory:  WNL  Fund of knowledge:   Good  Insight:    Good  Judgment:   Good  Impulse Control:  Good        Individualized Treatment Plan                Strengths: bright, verbal, motivated, self aware, self advocate   Supports: spouse, family, friends, colleagues   Goal/Needs for Treatment:  In order of importance to patient  1) Develop healthy interpersonal relationships that lead to the alleviation and help prevent the relapse of depression.  2) Learn and implement coping skills that result in a reduction of anxiety and worry, and improved functioning.   3) Combine skills learned in therapy into a new daily approach to managing ADHD.   Client Statement of Needs: requests assistance with piecing back together her life as it once was before she got depressed (burnt out).   Treatment Level: Weekly Individual Outpatient Psychotherapy.  Symptoms: autonomic hyperactivity (e.g., palpitations, shortness of breath, dry mouth, trouble swallowing, nausea, diarrhea).  Childhood history of Attention Deficit Disorder (ADD) that was either diagnosed or later concluded due to the symptoms of behavioral problems at school, impulsivity,  temper outbursts, and lack of concentration.  Depressed or irritable mood. Diminished interest in or enjoyment of activities. Easily distracted and drawn from task at hand.  Excessive and/or unrealistic worry that is difficult to control occurring more days than not for at least 6 months about a number of events or activities.  Feelings of hopelessness, worthlessness, or inappropriate guilt.  Lack of energy.   Poor concentration and indecisiveness.  Restless and fidgety; unable to be  sedentary for more than a short time.   Client Treatment Preferences: Continue with current therapist   Healthcare consumer's goal for treatment:  Psychologist, Ronal Jenkins Sprung, Ph.D. will support the patient's ability to achieve the goals identified. Cognitive Behavioral Therapy, Dialectical Behavioral Therapy, Motivational Interviewing, SPACE, parent training, and other evidenced-based practices will be used to promote progress towards healthy functioning.   Healthcare consumer Olivia [name] will: Actively participate in therapy, working towards healthy functioning.    *Justification for Continuation/Discontinuation of Goal: R=Revised, O=Ongoing, A=Achieved, D=Discontinued  Goal 1) Develop healthy interpersonal relationships that lead to the alleviation and help prevent the relapse of depression.  5 Point Likert rating baseline date: 05/28/2021 Target Date Goal Was reviewed Status Code Progress towards goal/Likert rating  05/29/2023 05/28/2022         O 3/5 - learning to set better limits and boundaries, still hasn't been able to regulate her daily routines  05/25/2024 05/26/2023         O        3.75 - pt reestablished friendship but has not be consistent in reaching out, has improved her relationship with her parents with appropriate limits.  Is more accepting of her relationship with her estranged daughter as healthy for her and not a failure   05/31/2025 05/31/2024  4/5 - pt has established some new friendships, and is better at planning social events in order to spend time with these new friends   Goal 2) Learn and implement coping skills that result in a reduction of anxiety and worry, and improved daily functioning.  5 Point Likert rating baseline date: 05/28/2021  Target Date Goal Was reviewed Status Code Progress towards goal/Likert rating  05/29/2023 05/28/2022          O 2/5 - learning to use her skills more consistently  05/25/2024 05/26/2023          O 3.5 - pt is learning to use  her skills and better manage her unrealistic and perfectionist tendencies that keep her stressed and anxious  05/31/2025 05/31/2024          O 4/5 - pt is able to head off the anxiety spirals that used to over take her,     Goal 3) Combine skills learned in therapy into a new daily approach to managing ADHD.  5 Point Likert rating baseline date: 05/28/2021  Target Date Goal Was reviewed Status Code Progress towards goal/Likert rating  05/29/2023 05/28/2022          O 2/5 - learning to use her skills more consistently  05/25/2024 05/26/2023          O 3.75 - accepted the need and began medication, has been more aware, recognize, acknowledge and accept the issues related ADHD and is better able to apply strategies. Pt is kinder to herself around her limitations or need for an alternative way of approaching tasks  05/31/2025 05/31/2024          O 3.25/5 - pt feels like she has regressed some, returning  to past patterns of avoidance and procrastination   This plan has been reviewed and created by the following participants:  This plan will be reviewed at least every 12 months. Date Behavioral Health Clinician Date Guardian/Patient   05/28/2022 Ronal Jenkins Sprung, Ph.D.   05/28/2022 Olivia [name]  05/26/2023 Ronal Jenkins Sprung, Ph.D.  05/26/2023 Olivia [name]  05/31/2024 Ronal Jenkins Sprung, Ph.D. 05/31/2024 Olivia [name]         Diagnosis: Major Depressive Disorder, recurrent, mild Generalized Anxiety Disorder Attention Deficit Disorder, predominantly inattentive   Olivia [name] reports that she completed her sleep study and they noted that her oxygen levels drop into the 80s at night.  We d/e/p what occurred, how she felt about the seep study results, and feeling hopeful about sleeping better.  We also d/ what it would be like to have a job with regular daytime hours with all its implications for her sleep and health.  Olivia [name] states that she has been spending some time with her friends gaming.  We  d/p feelings of guilt about gaming instead of being productive, joy and having more energy to do things around the house.    Ronal Jenkins Sprung, PhD  Son: Spur[place] (17) he is on the ASD Friend - Amy  Super Ego - Dickey

## 2024-06-15 ENCOUNTER — Other Ambulatory Visit: Payer: Self-pay

## 2024-06-20 DIAGNOSIS — R069 Unspecified abnormalities of breathing: Secondary | ICD-10-CM | POA: Diagnosis not present

## 2024-06-21 ENCOUNTER — Ambulatory Visit: Admitting: Psychology

## 2024-06-21 ENCOUNTER — Telehealth: Admitting: Physician Assistant

## 2024-06-21 DIAGNOSIS — F900 Attention-deficit hyperactivity disorder, predominantly inattentive type: Secondary | ICD-10-CM | POA: Diagnosis not present

## 2024-06-21 DIAGNOSIS — F330 Major depressive disorder, recurrent, mild: Secondary | ICD-10-CM

## 2024-06-21 DIAGNOSIS — F411 Generalized anxiety disorder: Secondary | ICD-10-CM | POA: Diagnosis not present

## 2024-06-21 DIAGNOSIS — N390 Urinary tract infection, site not specified: Secondary | ICD-10-CM

## 2024-06-21 DIAGNOSIS — F320 Major depressive disorder, single episode, mild: Secondary | ICD-10-CM

## 2024-06-21 NOTE — Progress Notes (Unsigned)
 PROGRESS NOTE:   Name: Olivia [name] Date: 06/21/2024 MRN: [id number] DOB: [birth year] PCP: Olivia Lavern CROME, MD  Time spent: 4:00 PM - 4:58 PM  Annual Review: 05/31/2025  Today I met with  Olivia [name] in remote video (Caregility) face-to-face individual psychotherapy.  Distance Site: [name]: Dr Edison Remote Office Consent: Obtained verbal consent to transmit session remotely.   Patient is aware of the limitations inherent in participating in virtual therapy.   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 She is normally a high functioning and driven individual and it is a struggle to get motivated to do anything. If it is necessary for work she does it and does a good job, but it is not the same. She has gained 70 pounds in less than 9 months and it has impacted her self image. She is also peri menopausal and these hormonal changes are also likely to be contributing to her changing mood states.   Background History: Olivia [name] is contending with aging parents.  Her mother was diagnosed this year with vascular dementia, and her father had quadrupel by-pass surgery nearly two years ago, but continues to have other medical complications, and was most recently diagnosed with cancer. She is the medical point person for her family.  She is the main person who is supporting and providing guidance to her aging parents.  Olivia [name] has one sister, but her sister struggles to manage her own life and that of her daughter.   [name] states that she was diagnosed with adult ADHD and responded well to medications.  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)   Son: Olivia [name] (17) he is on the ASD, he is a Holiday representative and attends [employer]   Mental Status Exam: Appearance:   Casual     Behavior:  Appropriate  Motor:  Normal   Speech/Language:   NA  Affect:  Appropriate  Mood:  normal  Thought process:  normal  Thought content:    WNL  Sensory/Perceptual disturbances:    WNL  Orientation:  oriented to person, place, time/date, situation, and day of week  Attention:  Good  Concentration:  Good  Memory:  WNL  Fund of knowledge:   Good  Insight:    Good  Judgment:   Good  Impulse Control:  Good        Individualized Treatment Plan                Strengths: bright, verbal, motivated, self aware, self advocate   Supports: spouse, family, friends, colleagues   Goal/Needs for Treatment:  In order of importance to patient  1) Develop healthy interpersonal relationships that lead to the alleviation and help prevent the relapse of depression.  2) Learn and implement coping skills that result in a reduction of anxiety and worry, and improved functioning.   3) Combine skills learned in therapy into a new daily approach to managing ADHD.   Client Statement of Needs: requests assistance with piecing back together her life as it once was before she got depressed (burnt out).   Treatment Level: Weekly Individual Outpatient Psychotherapy.  Symptoms: autonomic hyperactivity (e.g., palpitations, shortness of breath, dry mouth, trouble swallowing, nausea, diarrhea).  Childhood history of Attention Deficit Disorder (ADD) that was either diagnosed or later concluded due to the symptoms of behavioral problems at school, impulsivity,  temper outbursts, and lack of concentration.  Depressed or irritable mood. Diminished interest in or enjoyment of activities. Easily distracted and drawn from task at hand.  Excessive and/or unrealistic worry that is difficult to control occurring more days than not for at least 6 months about a number of events or activities.  Feelings of hopelessness, worthlessness, or inappropriate guilt.  Lack of energy.   Poor concentration and indecisiveness.  Restless and fidgety; unable to be  sedentary for more than a short time.   Client Treatment Preferences: Continue with current therapist   Healthcare consumer's goal for treatment:  Psychologist, Ronal Jenkins Sprung, Ph.D. will support the patient's ability to achieve the goals identified. Cognitive Behavioral Therapy, Dialectical Behavioral Therapy, Motivational Interviewing, SPACE, parent training, and other evidenced-based practices will be used to promote progress towards healthy functioning.   Healthcare consumer Olivia [name] will: Actively participate in therapy, working towards healthy functioning.    *Justification for Continuation/Discontinuation of Goal: R=Revised, O=Ongoing, A=Achieved, D=Discontinued  Goal 1) Develop healthy interpersonal relationships that lead to the alleviation and help prevent the relapse of depression.  5 Point Likert rating baseline date: 05/28/2021 Target Date Goal Was reviewed Status Code Progress towards goal/Likert rating  05/29/2023 05/28/2022         O 3/5 - learning to set better limits and boundaries, still hasn't been able to regulate her daily routines  05/25/2024 05/26/2023         O        3.75 - pt reestablished friendship but has not be consistent in reaching out, has improved her relationship with her parents with appropriate limits.  Is more accepting of her relationship with her estranged daughter as healthy for her and not a failure   05/31/2025 05/31/2024  4/5 - pt has established some new friendships, and is better at planning social events in order to spend time with these new friends   Goal 2) Learn and implement coping skills that result in a reduction of anxiety and worry, and improved daily functioning.  5 Point Likert rating baseline date: 05/28/2021  Target Date Goal Was reviewed Status Code Progress towards goal/Likert rating  05/29/2023 05/28/2022          O 2/5 - learning to use her skills more consistently  05/25/2024 05/26/2023          O 3.5 - pt is learning to use  her skills and better manage her unrealistic and perfectionist tendencies that keep her stressed and anxious  05/31/2025 05/31/2024          O 4/5 - pt is able to head off the anxiety spirals that used to over take her,     Goal 3) Combine skills learned in therapy into a new daily approach to managing ADHD.  5 Point Likert rating baseline date: 05/28/2021  Target Date Goal Was reviewed Status Code Progress towards goal/Likert rating  05/29/2023 05/28/2022          O 2/5 - learning to use her skills more consistently  05/25/2024 05/26/2023          O 3.75 - accepted the need and began medication, has been more aware, recognize, acknowledge and accept the issues related ADHD and is better able to apply strategies. Pt is kinder to herself around her limitations or need for an alternative way of approaching tasks  05/31/2025 05/31/2024          O 3.25/5 - pt feels like she has regressed some, returning  to past patterns of avoidance and procrastination   This plan has been reviewed and created by the following participants:  This plan will be reviewed at least every 12 months. Date Behavioral Olivia Clinician Date Guardian/Patient   05/28/2022 Ronal Jenkins Sprung, Ph.D.   05/28/2022 Olivia [name]  05/26/2023 Ronal Jenkins Sprung, Ph.D.  05/26/2023 Olivia [name]  05/31/2024 Ronal Jenkins Sprung, Ph.D. 05/31/2024 Olivia [name]         Diagnosis: Major Depressive Disorder, recurrent, mild Generalized Anxiety Disorder Attention Deficit Disorder, predominantly inattentive   Olivia [name] reports that her father came over to get a break from her mother.  They had a long d/ about her parent's estate and dealing with her sister's unreasonable wishes.  We d/p what occurred, what she shared about her concerns, and need to move forward with having her mother assessed for competency.  I provided the support and guidance needed to be able to move forward with this difficult step.  Ronal Jenkins Sprung, PhD  Son:  Olivia [name] (17) he is on the ASD Olivia [name] - Olivia [name]  Super Ego - Olivia [name]

## 2024-06-21 NOTE — Progress Notes (Signed)
  Because of having recurrent urinary symptoms despite a recent antibiotic and with having a lot of medication allergies to other UTI medications, I feel your condition warrants further evaluation and I recommend that you be seen in a face-to-face visit to have a urine culture obtained to determine the best antibiotic course for you.   NOTE: There will be NO CHARGE for this E-Visit   If you are having a true medical emergency, please call 911.     For an urgent face to face visit, [facility] has multiple urgent care centers for your convenience.  Click the link below for the full list of locations and hours, walk-in wait times, appointment scheduling options and driving directions:  Urgent Care - [place], [place], [place], [place], [place], [place]  [place]     Your MyChart E-visit questionnaire answers were reviewed by a board certified advanced clinical practitioner to complete your personal care plan based on your specific symptoms.    Thank you for using e-Visits.     I have spent 5 minutes in review of e-visit questionnaire, review and updating patient chart, medical decision making and response to patient.   Olivia [name] CHRISTELLA Dickinson, PA-C

## 2024-06-23 ENCOUNTER — Ambulatory Visit: Admitting: Family Medicine

## 2024-06-23 ENCOUNTER — Other Ambulatory Visit (HOSPITAL_BASED_OUTPATIENT_CLINIC_OR_DEPARTMENT_OTHER): Payer: Self-pay

## 2024-06-23 VITALS — BP 130/80 | HR 84 | Temp 97.7°F | Wt 305.8 lb

## 2024-06-23 DIAGNOSIS — R300 Dysuria: Secondary | ICD-10-CM

## 2024-06-23 LAB — POC URINALSYSI DIPSTICK (AUTOMATED)
Bilirubin, UA: POSITIVE
Blood, UA: POSITIVE
Glucose, UA: POSITIVE — AB
Ketones, UA: POSITIVE
Nitrite, UA: POSITIVE
Protein, UA: POSITIVE — AB
Spec Grav, UA: 1.025 (ref 1.010–1.025)
Urobilinogen, UA: 4 U/dL — AB
pH, UA: 5.5 (ref 5.0–8.0)

## 2024-06-23 MED ORDER — CIPROFLOXACIN HCL 500 MG PO TABS
500.0000 mg | ORAL_TABLET | Freq: Two times a day (BID) | ORAL | 0 refills | Status: AC
  Filled 2024-06-23: qty 14, 7d supply, fill #0

## 2024-06-23 NOTE — Progress Notes (Signed)
 Established Patient Office Visit  Subjective   Patient ID: Olivia [name], female    DOB: [birth year]  Age: 58 y.o. MRN: [id number]  Chief Complaint  Patient presents with   Dysuria    HPI   Olivia [name] is seen today with recurrent urinary symptoms of some frequency and burning with urination.  Her recent history is that she had e-visit 05-24-2024 and was empirically prescribed Macrobid  for 5 days for classic UTI type symptoms.  Her symptoms did improve with the Macrobid  but after about 10 days she had recurrent symptoms.  She engaged in repeat e-visit 2 days ago and was advised to follow-up for further evaluation.  She has not had history of UTIs in the past for years prior to this.  No recent bladder instrumentation.  No fevers or chills.  No flank pain.  Does have multiple antibiotic allergies including anaphylaxis with cephalosporin, penicillin, and past intolerance with sulfa.  She did restart some Pyridium recently.  Past Medical History:  Diagnosis Date   Anxiety    Asthma    Environmental allergies    Hiatal hernia    Hiatal hernia without gangrene or obstruction 08/19/2018   History of parathyroid  surgery 2021 01/17/2021   For primary hyperparathyroidism. Dr. Eletha   HLD (hyperlipidemia) 04/15/2008   Microcytic anemia    Migraine, hormonal    Multiple thyroid  nodules 08/19/2018   2016 IMPRESSION: Multinodular goiter with multiple bilateral thyroid  nodules. These are generally round and hypoechoic. There is a mildly complex cyst in the right lobe measuring up to 1.1 cm. The largest solid nodule is in the inferior right lobe measures 1.8 x 1.1 x 1.4 cm. This could either be sampled or followed by ultrasound.   PAC (premature atrial contraction)    PACs (premature atrial contraction)    Palpitations    PCOS (polycystic ovarian syndrome) 10/25/2014   PONV (postoperative nausea and vomiting)    Pre-diabetes    Reflux    Seasonal allergies    Past Surgical History:  Procedure  Laterality Date   HIATAL HERNIA REPAIR  04/14/2012   Procedure: LAPAROSCOPIC REPAIR OF HIATAL HERNIA;  Surgeon: Donnice KATHEE Lunger, MD;  Location: [facility];  Service: General;  Laterality: N/A;  Hiatal Hernia Repair-Replacement of Lap Band   LAPAROSCOPIC GASTRIC BANDING  08/08/2008   PARATHYROIDECTOMY Left 06/02/2020   Procedure: LEFT INFERIOR PARATHYROIDECTOMY;  Surgeon: Eletha Boas, MD;  Location: [facility];  Service: General;  Laterality: Left;   THYROIDECTOMY N/A 06/02/2020   Procedure: TOTAL THYROIDECTOMY;  Surgeon: Eletha Boas, MD;  Location: [facility];  Service: General;  Laterality: N/A;   TONSILLECTOMY  1981    reports that she has never smoked. She has never used smokeless tobacco. She reports that she does not drink alcohol and does not use drugs. family history includes Colon cancer in her mother; Colon polyps in her mother; Diabetes in her mother; Hypertension in her father; Obesity in her father, mother, and sister. Allergies  Allergen Reactions   Cephalosporins Anaphylaxis and Other (See Comments)    Caused hypotension and a fever   Egg-Derived Products Hives, Swelling and Other (See Comments)    Angioedema   Penicillins Anaphylaxis    Has patient had a PCN reaction causing immediate rash, facial/tongue/throat swelling, SOB or lightheadedness with hypotension: Yes Has patient had a PCN reaction causing severe rash involving mucus membranes or skin necrosis: No Has patient had a PCN reaction that required hospitalization: Observed X12 hrs Has patient had a PCN reaction  occurring within the last 10 years: No If all of the above answers are NO, then may proceed with Cephalosporin use.    Sulfa Drugs Cross Reactors Other (See Comments)    Liver failure   Adhesive [Tape] Hives and Itching    Cannot wear for any length of time   Effexor  [Venlafaxine ] Other (See Comments)    Lightheadedness, dizziness, and headaches resulted    Review of Systems  Constitutional:  Negative for chills  and fever.  Genitourinary:  Positive for dysuria and frequency. Negative for flank pain.      Objective:     BP 130/80   Pulse 84   Temp 97.7 F (36.5 C) (Oral)   Wt (!) 305 lb 12.8 oz (138.7 kg)   SpO2 94%   BMI 44.51 kg/m  BP Readings from Last 3 Encounters:  06/23/24 130/80  04/15/24 (!) 115/91  04/08/24 126/79   Wt Readings from Last 3 Encounters:  06/23/24 (!) 305 lb 12.8 oz (138.7 kg)  04/08/24 (!) 309 lb (140.2 kg)  03/09/24 (!) 328 lb (148.8 kg)      Physical Exam Vitals reviewed.  Constitutional:      General: She is not in acute distress.    Appearance: She is not ill-appearing.  Cardiovascular:     Rate and Rhythm: Normal rate and regular rhythm.  Pulmonary:     Effort: Pulmonary effort is normal.     Breath sounds: Normal breath sounds. No wheezing or rales.  Neurological:     Mental Status: She is alert.      Results for orders placed or performed in visit on 06/23/24  POCT Urinalysis Dipstick (Automated)  Result Value Ref Range   Color, UA Orange    Clarity, UA Clear    Glucose, UA Positive (A) Negative   Bilirubin, UA Positive    Ketones, UA Positive    Spec Grav, UA 1.025 1.010 - 1.025   Blood, UA Positive    pH, UA 5.5 5.0 - 8.0   Protein, UA Positive (A) Negative   Urobilinogen, UA 4.0 (A) 0.2 or 1.0 E.U./dL   Nitrite, UA Positive    Leukocytes, UA Large (3+) (A) Negative      The 10-year ASCVD risk score (Arnett DK, et al., 2019) is: 2%    Assessment & Plan:   Problem List Items Addressed This Visit   None Visit Diagnoses       Dysuria    -  Primary   Relevant Orders   POCT Urinalysis Dipstick (Automated) (Completed)   Urine Culture     Olivia [name] is seen with recurrent dysuria for approximately past week.  She was treated back July 21 with Macrobid  and did improve on the Macrobid .  Has not had any high risk factors such as recent bladder instrumentation and does not have history of recurrent UTIs in the past.  Urine  dipstick today complicated by Pyridium use with multiple positives  -Urine culture sent - Start Cipro  500 mg twice daily for 7 days pending culture results - Patient aware to push fluids - Follow-up immediately for any fever or worsening symptoms  No follow-ups on file.    Wolm Scarlet, MD

## 2024-06-24 LAB — URINE CULTURE
MICRO NUMBER:: 16858475
Result:: NO GROWTH
SPECIMEN QUALITY:: ADEQUATE

## 2024-06-25 ENCOUNTER — Ambulatory Visit: Payer: Self-pay | Admitting: Family Medicine

## 2024-06-25 DIAGNOSIS — R300 Dysuria: Secondary | ICD-10-CM

## 2024-06-28 ENCOUNTER — Ambulatory Visit: Admitting: Psychology

## 2024-06-28 DIAGNOSIS — F411 Generalized anxiety disorder: Secondary | ICD-10-CM

## 2024-06-28 DIAGNOSIS — F330 Major depressive disorder, recurrent, mild: Secondary | ICD-10-CM | POA: Diagnosis not present

## 2024-06-28 DIAGNOSIS — F900 Attention-deficit hyperactivity disorder, predominantly inattentive type: Secondary | ICD-10-CM

## 2024-06-28 DIAGNOSIS — F320 Major depressive disorder, single episode, mild: Secondary | ICD-10-CM

## 2024-06-28 NOTE — Telephone Encounter (Signed)
 Patient informed of the results and voiced understanding

## 2024-06-28 NOTE — Addendum Note (Signed)
 Addended by: METTA KRISTEN CROME on: 06/28/2024 10:21 AM   Modules accepted: Orders

## 2024-06-28 NOTE — Progress Notes (Signed)
 PROGRESS NOTE:   Name: Olivia [name] Date: 06/28/2024 MRN: [id number] DOB: 12-13-1965 PCP: Jodie Lavern CROME, MD  Time spent: 4:00 PM - 4:58 PM  Annual Review: 05/31/2025  Today I met with  Olivia [name] in remote video (Caregility) face-to-face individual psychotherapy.  Distance Site: [name]: Dr Edison Remote Office Consent: Obtained verbal consent to transmit session remotely.   Patient is aware of the limitations inherent in participating in virtual therapy.   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 She is normally a high functioning and driven individual and it is a struggle to get motivated to do anything. If it is necessary for work she does it and does a good job, but it is not the same. She has gained 70 pounds in less than 9 months and it has impacted her self image. She is also peri menopausal and these hormonal changes are also likely to be contributing to her changing mood states.   Background History: Olivia [name] is contending with aging parents.  Her mother was diagnosed this year with vascular dementia, and her father had quadrupel by-pass surgery nearly two years ago, but continues to have other medical complications, and was most recently diagnosed with cancer. She is the medical point person for her family.  She is the main person who is supporting and providing guidance to her aging parents.  Olivia [name] has one sister, but her sister struggles to manage her own life and that of her daughter.   Olivia [name] states that she was diagnosed with adult ADHD and responded well to medications.  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)   Olivia [name]: Health visitor (17) he is on the ASD, he is a Holiday representative and attends [employer]   Mental Status Exam: Appearance:   Casual     Behavior:  Appropriate  Motor:  Normal   Speech/Language:   NA  Affect:  Appropriate  Mood:  normal  Thought process:  normal  Thought content:    WNL  Sensory/Perceptual disturbances:    WNL  Orientation:  oriented to person, place, time/date, situation, and day of week  Attention:  Good  Concentration:  Good  Memory:  WNL  Fund of knowledge:   Good  Insight:    Good  Judgment:   Good  Impulse Control:  Good        Individualized Treatment Plan                Strengths: bright, verbal, motivated, self aware, self advocate   Supports: spouse, family, friends, colleagues   Goal/Needs for Treatment:  In order of importance to patient  1) Develop healthy interpersonal relationships that lead to the alleviation and help prevent the relapse of depression.  2) Learn and implement coping skills that result in a reduction of anxiety and worry, and improved functioning.   3) Combine skills learned in therapy into a new daily approach to managing ADHD.   Client Statement of Needs: requests assistance with piecing back together her life as it once was before she got depressed (burnt out).   Treatment Level: Weekly Individual Outpatient Psychotherapy.  Symptoms: autonomic hyperactivity (e.g., palpitations, shortness of breath, dry mouth, trouble swallowing, nausea, diarrhea).  Childhood history of Attention Deficit Disorder (ADD) that was either diagnosed or later concluded due to the symptoms of behavioral problems at school, impulsivity,  temper outbursts, and lack of concentration.  Depressed or irritable mood. Diminished interest in or enjoyment of activities. Easily distracted and drawn from task at hand.  Excessive and/or unrealistic worry that is difficult to control occurring more days than not for at least 6 months about a number of events or activities.  Feelings of hopelessness, worthlessness, or inappropriate guilt.  Lack of energy.   Poor concentration and indecisiveness.  Restless and fidgety; unable to be  sedentary for more than a short time.   Client Treatment Preferences: Continue with current therapist   Healthcare consumer's goal for treatment:  Psychologist, Ronal Jenkins Sprung, Ph.D. will support the patient's ability to achieve the goals identified. Cognitive Behavioral Therapy, Dialectical Behavioral Therapy, Motivational Interviewing, SPACE, parent training, and other evidenced-based practices will be used to promote progress towards healthy functioning.   Healthcare consumer Olivia [name] will: Actively participate in therapy, working towards healthy functioning.    *Justification for Continuation/Discontinuation of Goal: R=Revised, O=Ongoing, A=Achieved, D=Discontinued  Goal 1) Develop healthy interpersonal relationships that lead to the alleviation and help prevent the relapse of depression.  5 Point Likert rating baseline date: 05/28/2021 Target Date Goal Was reviewed Status Code Progress towards goal/Likert rating  05/29/2023 05/28/2022         O 3/5 - learning to set better limits and boundaries, still hasn't been able to regulate her daily routines  05/25/2024 05/26/2023         O        3.75 - pt reestablished friendship but has not be consistent in reaching out, has improved her relationship with her parents with appropriate limits.  Is more accepting of her relationship with her estranged daughter as healthy for her and not a failure   05/31/2025 05/31/2024  4/5 - pt has established some new friendships, and is better at planning social events in order to spend time with these new friends   Goal 2) Learn and implement coping skills that result in a reduction of anxiety and worry, and improved daily functioning.  5 Point Likert rating baseline date: 05/28/2021  Target Date Goal Was reviewed Status Code Progress towards goal/Likert rating  05/29/2023 05/28/2022          O 2/5 - learning to use her skills more consistently  05/25/2024 05/26/2023          O 3.5 - pt is learning to use  her skills and better manage her unrealistic and perfectionist tendencies that keep her stressed and anxious  05/31/2025 05/31/2024          O 4/5 - pt is able to head off the anxiety spirals that used to over take her,     Goal 3) Combine skills learned in therapy into a new daily approach to managing ADHD.  5 Point Likert rating baseline date: 05/28/2021  Target Date Goal Was reviewed Status Code Progress towards goal/Likert rating  05/29/2023 05/28/2022          O 2/5 - learning to use her skills more consistently  05/25/2024 05/26/2023          O 3.75 - accepted the need and began medication, has been more aware, recognize, acknowledge and accept the issues related ADHD and is better able to apply strategies. Pt is kinder to herself around her limitations or need for an alternative way of approaching tasks  05/31/2025 05/31/2024          O 3.25/5 - pt feels like she has regressed some, returning  to past patterns of avoidance and procrastination   This plan has been reviewed and created by the following participants:  This plan will be reviewed at least every 12 months. Date Behavioral Health Clinician Date Guardian/Patient   05/28/2022 Ronal Jenkins Sprung, Ph.D.   05/28/2022 Olivia [name]  05/26/2023 Ronal Jenkins Sprung, Ph.D.  05/26/2023 Olivia [name]  05/31/2024 Ronal Jenkins Sprung, Ph.D. 05/31/2024 Olivia [name]         Diagnosis: Major Depressive Disorder, recurrent, mild Generalized Anxiety Disorder Attention Deficit Disorder, predominantly inattentive   Olivia [name] reports that her father wasn't feeling well and it turns out he was in heart failure.  Earlier today she and her father went into the attorney's office without her mother.  We d/e/p her mixed feelings of sadness and anger, feeling embarrassed about her sister's behavior, and feeling upset as she thought about a time without her parents.    Lastly, we d/ her desire to take a Tai Chi class next month and how it would be  beneficial movement both physically and mentally.  Ronal Jenkins Sprung, PhD  Olivia [name]: Olivia [name] (19) he is on the ASD Friend - Amy  Super Ego - Dickey

## 2024-07-01 ENCOUNTER — Encounter (HOSPITAL_BASED_OUTPATIENT_CLINIC_OR_DEPARTMENT_OTHER): Payer: Self-pay | Admitting: Adult Health

## 2024-07-01 ENCOUNTER — Ambulatory Visit (INDEPENDENT_AMBULATORY_CARE_PROVIDER_SITE_OTHER): Admitting: Adult Health

## 2024-07-01 VITALS — BP 123/83 | HR 107 | Wt 302.0 lb

## 2024-07-01 DIAGNOSIS — G4733 Obstructive sleep apnea (adult) (pediatric): Secondary | ICD-10-CM

## 2024-07-01 DIAGNOSIS — R0602 Shortness of breath: Secondary | ICD-10-CM | POA: Diagnosis not present

## 2024-07-01 DIAGNOSIS — Z6841 Body Mass Index (BMI) 40.0 and over, adult: Secondary | ICD-10-CM

## 2024-07-01 NOTE — Patient Instructions (Addendum)
 Begin CPAP therapy, wear all night long for at least 6 or more hours Work on healthy weight loss  Do not drive if sleepy   Can discuss in more detail for Zepbound at next visit.   Set up 2 D echo .   Follow up in 3 months and As needed  -Virtual.

## 2024-07-01 NOTE — Progress Notes (Signed)
 @Patient  ID: Olivia [name], female    DOB: 09/03/1966, 58 y.o.   MRN: [id number]  Chief Complaint  Patient presents with   Follow-up    Referring provider: Jodie Lavern CROME, MD  HPI: 58 year old female seen for sleep consult April 15, 2024 for daytime fatigue insomnia and erythrocytosis Patient is a NP   TEST/EVENTS :  Home sleep study June 09, 2024 AHI 32.0/hour and SpO2 low at 79%   07/01/2024 Follow up ; OSA Discussed the use of AI scribe software for clinical note transcription with the patient, who gave verbal consent to proceed.  History of Present Illness Na Waldrip is a 58 year old female with severe obstructive sleep apnea who presents for follow-up after a sleep study.  She underwent a sleep study June 09, 2024 revealing severe obstructive sleep apnea with AHI 32.0/hour and SpO2 low at 79%  She experiences chronic fatigue, waking up with headaches, and constant exhaustion, initially attributing these symptoms to menopause. Her sleep issues are persistent, and she describes a sensation of her heart working hard, particularly in humid conditions like showers, which also cause shortness of breath.  She has a history of erythrocytosis. She has experienced significant weight gain over the years, despite having undergone gastric bypass surgery. Although she has lost 30 pounds recently, maintaining weight loss remains challenging.   Her medical history includes a non-cancerous parathyroid  tumor, which was surgically removed along with her thyroid , necessitating thyroid  medication.  Denies any history of pancreatitis, gallbladder disease, thyroid  cancer.  She also suffers from severe psoriasis, managed with monthly Taltz  injections.   In her social history, she works long hours and is frequently called in the middle of the night, which may exacerbate her sleep issues. Her family history is notable for cardiac issues, as her father underwent coronary artery bypass  grafting.     Allergies  Allergen Reactions   Cephalosporins Anaphylaxis and Other (See Comments)    Caused hypotension and a fever   Egg-Derived Products Hives, Swelling and Other (See Comments)    Angioedema   Penicillins Anaphylaxis    Has patient had a PCN reaction causing immediate rash, facial/tongue/throat swelling, SOB or lightheadedness with hypotension: Yes Has patient had a PCN reaction causing severe rash involving mucus membranes or skin necrosis: No Has patient had a PCN reaction that required hospitalization: Observed X12 hrs Has patient had a PCN reaction occurring within the last 10 years: No If all of the above answers are NO, then may proceed with Cephalosporin use.    Sulfa Drugs Cross Reactors Other (See Comments)    Liver failure   Adhesive [Tape] Hives and Itching    Cannot wear for any length of time   Effexor  [Venlafaxine ] Other (See Comments)    Lightheadedness, dizziness, and headaches resulted    Immunization History  Administered Date(s) Administered   Influenza Inj Mdck Quad Pf 07/21/2020   Influenza, Quadrivalent, Recombinant, Inj, Pf 08/03/2019   Influenza, Seasonal, Injecte, Preservative Fre 09/03/2023   Influenza,inj,Quad PF,6+ Mos 08/07/2018, 07/21/2020   Influenza-Unspecified 10/25/2014, 08/15/2021   Moderna Covid-19 Vaccine Bivalent Booster 74yrs & up 03/21/2021   Moderna SARS-COV2 Booster Vaccination 03/29/2021   PFIZER(Purple Top)SARS-COV-2 Vaccination 10/27/2019, 11/17/2019, 08/18/2020   PNEUMOCOCCAL CONJUGATE-20 03/09/2024   Tdap 05/01/2020   Tetanus 04/04/1997, 11/05/2007   Zoster Recombinant(Shingrix) 05/22/2021, 11/06/2021    Past Medical History:  Diagnosis Date   Anxiety    Asthma    Environmental allergies    Hiatal hernia  Hiatal hernia without gangrene or obstruction 08/19/2018   History of parathyroid  surgery 2021 01/17/2021   For primary hyperparathyroidism. Dr. Eletha   HLD (hyperlipidemia) 04/15/2008    Microcytic anemia    Migraine, hormonal    Multiple thyroid  nodules 08/19/2018   2016 IMPRESSION: Multinodular goiter with multiple bilateral thyroid  nodules. These are generally round and hypoechoic. There is a mildly complex cyst in the right lobe measuring up to 1.1 cm. The largest solid nodule is in the inferior right lobe measures 1.8 x 1.1 x 1.4 cm. This could either be sampled or followed by ultrasound.   PAC (premature atrial contraction)    PACs (premature atrial contraction)    Palpitations    PCOS (polycystic ovarian syndrome) 10/25/2014   PONV (postoperative nausea and vomiting)    Pre-diabetes    Reflux    Seasonal allergies     Tobacco History: Social History   Tobacco Use  Smoking Status Never  Smokeless Tobacco Never   Counseling given: Not Answered   Outpatient Medications Prior to Visit  Medication Sig Dispense Refill   albuterol  (PROVENTIL  HFA) 108 (90 Base) MCG/ACT inhaler Inhale 2 puffs into the lungs every 6 (six) hours as needed for wheezing or shortness of breath. 18 g 2   albuterol  (PROVENTIL ) (2.5 MG/3ML) 0.083% nebulizer solution Take 3 mLs (2.5 mg total) by nebulization every 6 (six) hours as needed for wheezing or shortness of breath. 75 mL 12   buPROPion  (WELLBUTRIN  XL) 150 MG 24 hr tablet Take 1 tablet (150 mg total) by mouth daily. 90 tablet 3   calcipotriene  (DOVONOX) 0.005 % cream Apply to affected areas externally Twice a day 60 g 3   cetirizine (ZYRTEC) 10 MG tablet Take 10 mg by mouth daily as needed for allergies.      escitalopram  (LEXAPRO ) 20 MG tablet Take 1 tablet (20 mg total) by mouth daily. 90 tablet 3   famotidine  (PEPCID ) 20 MG tablet Take 20 mg by mouth daily as needed for heartburn or indigestion.     ixekizumab  (TALTZ ) 80 MG/ML pen 1 injection Subcutaneous Once every 4 weeks 1 mL 5   levothyroxine  (SYNTHROID ) 88 MCG tablet Take 1 tablet (88 mcg total) by mouth daily. 90 tablet 3   liothyronine  (CYTOMEL ) 25 MCG tablet Take 1 tablet  (25 mcg total) by mouth daily.     metoprolol  tartrate (LOPRESSOR ) 25 MG tablet Take 12.5 mg by mouth daily as needed. 60 tablet 1   metroNIDAZOLE  (METROCREAM ) 0.75 % cream Apply 1 Application topically 2 (two) times daily. 45 g 1   Multiple Vitamin (MULITIVITAMIN WITH MINERALS) TABS Take 1 tablet by mouth daily.     nitrofurantoin , macrocrystal-monohydrate, (MACROBID ) 100 MG capsule Take 1 capsule (100 mg total) by mouth 2 (two) times daily. 10 capsule 0   progesterone  (PROMETRIUM ) 100 MG capsule Take 1 capsule (100 mg total) by mouth at bedtime. 60 capsule 2   rosuvastatin  (CRESTOR ) 20 MG tablet Take 1 tablet (20 mg total) by mouth at bedtime. 90 tablet 3   Vitamin D , Cholecalciferol , 50 MCG (2000 UT) CAPS Take 2,000 Units by mouth daily.     No facility-administered medications prior to visit.     Review of Systems:   Constitutional:   No  weight loss, night sweats,  Fevers, chills, +fatigue, or  lassitude.  HEENT:   No headaches,  Difficulty swallowing,  Tooth/dental problems, or  Sore throat,  No sneezing, itching, ear ache, nasal congestion, post nasal drip,   CV:  No chest pain,  Orthopnea, PND, swelling in lower extremities, anasarca, dizziness, palpitations, syncope.   GI  No heartburn, indigestion, abdominal pain, nausea, vomiting, diarrhea, change in bowel habits, loss of appetite, bloody stools.   Resp: No shortness of breath with exertion or at rest.  No excess mucus, no productive cough,  No non-productive cough,  No coughing up of blood.  No change in color of mucus.  No wheezing.  No chest wall deformity  Skin: no rash or lesions.  GU: no dysuria, change in color of urine, no urgency or frequency.  No flank pain, no hematuria   MS:  No joint pain or swelling.  No decreased range of motion.  No back pain.    Physical Exam  BP 123/83 (BP Location: Left Arm, Patient Position: Sitting)   Pulse (!) 107   Wt (!) 302 lb (137 kg)   SpO2 95%   BMI 43.96  kg/m   GEN: A/Ox3; pleasant , NAD, well nourished    HEENT:  [place]/AT,   NOSE-clear, THROAT-clear, no lesions, no postnasal drip or exudate noted. Class 3 MP airway   NECK:  Supple w/ fair ROM; no JVD; normal carotid impulses w/o bruits; no thyromegaly or nodules palpated; no lymphadenopathy.    RESP  Clear  P & A; w/o, wheezes/ rales/ or rhonchi. no accessory muscle use, no dullness to percussion  CARD:  RRR, no m/r/g, no peripheral edema, pulses intact, no cyanosis or clubbing.  GI:   Soft & nt; nml bowel sounds; no organomegaly or masses detected.   Musco: Warm bil, no deformities or joint swelling noted.   Neuro: alert, no focal deficits noted.    Skin: Warm, no lesions or rashes    Lab Results:  CBC    Component Value Date/Time   WBC 7.9 04/08/2024 0542   RBC 5.57 (H) 04/08/2024 0542   HGB 15.4 (H) 04/08/2024 0542   HCT 44.2 04/08/2024 0542   PLT 297 04/08/2024 0542   MCV 79.4 (L) 04/08/2024 0542   MCH 27.6 04/08/2024 0542   MCHC 34.8 04/08/2024 0542   RDW 13.3 04/08/2024 0542   LYMPHSABS 2.1 04/08/2024 0542   MONOABS 0.8 04/08/2024 0542   EOSABS 0.3 04/08/2024 0542   BASOSABS 0.0 04/08/2024 0542    BMET    Component Value Date/Time   NA 139 04/08/2024 0542   NA 140 01/28/2020 0000   K 3.5 04/08/2024 0542   CL 104 04/08/2024 0542   CO2 20 (L) 04/08/2024 0542   GLUCOSE 103 (H) 04/08/2024 0542   BUN <5 (L) 04/08/2024 0542   BUN 13 01/28/2020 0000   CREATININE 0.73 04/08/2024 0542   CALCIUM  10.2 04/08/2024 0542   GFRNONAA >60 04/08/2024 0542   GFRAA >60 06/03/2020 0541    BNP No results found for: BNP  ProBNP No results found for: PROBNP  Imaging: No results found.  Administration History     None          Latest Ref Rng & Units 11/20/2018   11:00 AM  PFT Results  FVC-Pre L 3.50   FVC-Predicted Pre % 86   FVC-Post L 3.16   FVC-Predicted Post % 78   Pre FEV1/FVC % % 85   Post FEV1/FCV % % 90   FEV1-Pre L 2.99   FEV1-Predicted Pre  % 93   FEV1-Post L 2.84   DLCO uncorrected ml/min/mmHg 25.25   DLCO  UNC% % 85   DLCO corrected ml/min/mmHg 24.33   DLCO COR %Predicted % 82   DLVA Predicted % 93   TLC L 4.99   TLC % Predicted % 88   RV % Predicted % 74     No results found for: NITRICOXIDE      Assessment & Plan:   No problem-specific Assessment & Plan notes found for this encounter.  Assessment and Plan Assessment & Plan Severe obstructive sleep apnea   The sleep study reveals severe obstructive sleep apnea with 32-episodes per hour and oxygen desaturation to 79%. This likely contributes to erythrocytosis . She experiences chronic exhaustion, morning headaches, and weight gain, aligning with severe sleep apnea symptoms.  Begin CPAP therapy auto CPAP 5 to 15 cm H2O.  Educate on CPAP use, including mask selection and maintenance. Discuss potential use of Zepbound for weight management and sleep apnea treatment. Educate on Zepbound, its mechanism as a GLP-1 and GIP receptor agonist, potential side effects like nausea, and the need for gradual dose escalation. Discuss potential insurance coverage issues for Zepbound. Will discuss in more detail on follow up .    Shortness of breath   She experiences shortness of breath, especially in humid environments, raising concerns about potential cardiac issues.   Untreated severe sleep apnea may contribute to secondary pulmonary hypertension, affecting her breathing. She is concerned about heart failure,. Exam is unrevealing for CHF . A family history of significant cardiac disease increases her risk. Order an echocardiogram to evaluate cardiac function and rule out pulmonary hypertension. Recommend follow-up with cardiologist Dr. Wonda for a routine cardiac workup.  Morbid obesity with BMI at 43.  Patient has had previous weight loss surgery.  Continues to struggle with weight loss challenges.  Has tried multiple options in the past but has been unsuccessful.  Discussed  Zepbound.  Will discuss more in detail on follow-up visit   Plan  Patient Instructions  Begin CPAP therapy, wear all night long for at least 6 or more hours Work on healthy weight loss  Do not drive if sleepy   Can discuss in more detail for Zepbound at next visit.   Set up 2 D echo .   Follow up in 3 months and As needed  -Virtual.          Madelin Stank, NP 07/01/2024

## 2024-07-06 ENCOUNTER — Ambulatory Visit (INDEPENDENT_AMBULATORY_CARE_PROVIDER_SITE_OTHER): Admitting: Psychology

## 2024-07-06 ENCOUNTER — Other Ambulatory Visit: Payer: Self-pay

## 2024-07-06 DIAGNOSIS — F330 Major depressive disorder, recurrent, mild: Secondary | ICD-10-CM

## 2024-07-06 DIAGNOSIS — F411 Generalized anxiety disorder: Secondary | ICD-10-CM | POA: Diagnosis not present

## 2024-07-06 DIAGNOSIS — F900 Attention-deficit hyperactivity disorder, predominantly inattentive type: Secondary | ICD-10-CM

## 2024-07-06 DIAGNOSIS — F320 Major depressive disorder, single episode, mild: Secondary | ICD-10-CM

## 2024-07-06 NOTE — Progress Notes (Unsigned)
 PROGRESS NOTE:   Name: Olivia [name] Date: 07/06/2024 MRN: [id number] DOB: 05/04/1966 PCP: Jodie Lavern CROME, MD  Time spent: 4:00 PM - 4:58 PM  Annual Review: 05/31/2025  Today I met with  Olivia [name] in remote video (Caregility) face-to-face individual psychotherapy.  Distance Site: [name]: Dr Edison Remote Office Consent: Obtained verbal consent to transmit session remotely.   Patient is aware of the limitations inherent in participating in virtual therapy.   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 She is normally a high functioning and driven individual and it is a struggle to get motivated to do anything. If it is necessary for work she does it and does a good job, but it is not the same. She has gained 70 pounds in less than 9 months and it has impacted her self image. She is also peri menopausal and these hormonal changes are also likely to be contributing to her changing mood states.   Background History: Olivia [name] is contending with aging parents.  Her mother was diagnosed this year with vascular dementia, and her father had quadrupel by-pass surgery nearly two years ago, but continues to have other medical complications, and was most recently diagnosed with cancer. She is the medical point person for her family.  She is the main person who is supporting and providing guidance to her aging parents.  Olivia [name] has one sister, but her sister struggles to manage her own life and that of her daughter.   Manar states that she was diagnosed with adult ADHD and responded well to medications.  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)   Son: Olivia [name] (17) he is on the ASD, he is a Holiday representative and attends [employer]   Mental Status Exam: Appearance:   Casual     Behavior:  Appropriate   Motor:  Normal  Speech/Language:   NA  Affect:  Appropriate  Mood:  normal  Thought process:  normal  Thought content:    WNL  Sensory/Perceptual disturbances:    WNL  Orientation:  oriented to person, place, time/date, situation, and day of week  Attention:  Good  Concentration:  Good  Memory:  WNL  Fund of knowledge:   Good  Insight:    Good  Judgment:   Good  Impulse Control:  Good        Individualized Treatment Plan                Strengths: bright, verbal, motivated, self aware, self advocate   Supports: spouse, family, friends, colleagues   Goal/Needs for Treatment:  In order of importance to patient  1) Develop healthy interpersonal relationships that lead to the alleviation and help prevent the relapse of depression.  2) Learn and implement coping skills that result in a reduction of anxiety and worry, and improved functioning.   3) Combine skills learned in therapy into a new daily approach to managing ADHD.   Client Statement of Needs: requests assistance with piecing back together her life as it once was before she got depressed (burnt out).   Treatment Level: Weekly Individual Outpatient Psychotherapy.  Symptoms: autonomic hyperactivity (e.g., palpitations, shortness of breath, dry mouth, trouble swallowing, nausea, diarrhea).  Childhood history of Attention Deficit Disorder (ADD) that was either diagnosed or later concluded due to  the symptoms of behavioral problems at school, impulsivity, temper outbursts, and lack of concentration.  Depressed or irritable mood. Diminished interest in or enjoyment of activities. Easily distracted and drawn from task at hand.  Excessive and/or unrealistic worry that is difficult to control occurring more days than not for at least 6 months about a number of events or activities.  Feelings of hopelessness, worthlessness, or inappropriate guilt.  Lack of energy.   Poor concentration and indecisiveness.  Restless and  fidgety; unable to be sedentary for more than a short time.   Client Treatment Preferences: Continue with current therapist   Healthcare consumer's goal for treatment:  Psychologist, Olivia [name], Ph.D. will support the patient's ability to achieve the goals identified. Cognitive Behavioral Therapy, Dialectical Behavioral Therapy, Motivational Interviewing, SPACE, parent training, and other evidenced-based practices will be used to promote progress towards healthy functioning.   Healthcare consumer Olivia [name] will: Actively participate in therapy, working towards healthy functioning.    *Justification for Continuation/Discontinuation of Goal: R=Revised, O=Ongoing, A=Achieved, D=Discontinued  Goal 1) Develop healthy interpersonal relationships that lead to the alleviation and help prevent the relapse of depression.  5 Point Likert rating baseline date: 05/28/2021 Target Date Goal Was reviewed Status Code Progress towards goal/Likert rating  05/29/2023 05/28/2022         O 3/5 - learning to set better limits and boundaries, still hasn't been able to regulate her daily routines  05/25/2024 05/26/2023         O        3.75 - pt reestablished friendship but has not be consistent in reaching out, has improved her relationship with her parents with appropriate limits.  Is more accepting of her relationship with her estranged daughter as healthy for her and not a failure   05/31/2025 05/31/2024  4/5 - pt has established some new friendships, and is better at planning social events in order to spend time with these new friends   Goal 2) Learn and implement coping skills that result in a reduction of anxiety and worry, and improved daily functioning.  5 Point Likert rating baseline date: 05/28/2021  Target Date Goal Was reviewed Status Code Progress towards goal/Likert rating  05/29/2023 05/28/2022          O 2/5 - learning to use her skills more consistently  05/25/2024 05/26/2023          O 3.5 -  pt is learning to use her skills and better manage her unrealistic and perfectionist tendencies that keep her stressed and anxious  05/31/2025 05/31/2024          O 4/5 - pt is able to head off the anxiety spirals that used to over take her,     Goal 3) Combine skills learned in therapy into a new daily approach to managing ADHD.  5 Point Likert rating baseline date: 05/28/2021  Target Date Goal Was reviewed Status Code Progress towards goal/Likert rating  05/29/2023 05/28/2022          O 2/5 - learning to use her skills more consistently  05/25/2024 05/26/2023          O 3.75 - accepted the need and began medication, has been more aware, recognize, acknowledge and accept the issues related ADHD and is better able to apply strategies. Pt is kinder to herself around her limitations or need for an alternative way of approaching tasks  05/31/2025 05/31/2024          O 3.25/5 -  pt feels like she has regressed some, returning to past patterns of avoidance and procrastination   This plan has been reviewed and created by the following participants:  This plan will be reviewed at least every 12 months. Date Behavioral Olivia Clinician Date Guardian/Patient   05/28/2022 Olivia [name], Ph.D.   05/28/2022 Olivia [name]  05/26/2023 Olivia [name], Ph.D.  05/26/2023 Olivia [name]  05/31/2024 Olivia [name], Ph.D. 05/31/2024 Olivia [name]         Diagnosis: Major Depressive Disorder, recurrent, mild Generalized Anxiety Disorder Attention Deficit Disorder, predominantly inattentive   Olivia [name] reports that she got a work call that worried her and put her in a different mind set about :what's down the line.  We d/e/p her thoughts about the future, anxieties about work, and releasing her attachment to the idea that it is her responsibility to do the work to grow her department.  I encouraged her to begin to think of herself as the idea person, provide oversight, and hire staff to do the  work.  Once again, Olivia [name] work in therapy is to release he old attachments that led to burnout and shift mindsets.  Lastly, we d/p going to the attorney with her parents to sign documents,  It was a trying time seeing her mother's struggles and her father's stress.  I provided the support and guidance she needed around issues related to her aging parents.  Olivia Jenkins Sprung, PhD  Son: Olivia [name] (17) he is on the ASD Friend - Amy  [employer] [place]

## 2024-07-07 ENCOUNTER — Ambulatory Visit: Admitting: Family Medicine

## 2024-07-08 ENCOUNTER — Other Ambulatory Visit: Payer: Self-pay

## 2024-07-08 NOTE — Progress Notes (Signed)
 Specialty Pharmacy Refill Coordination Note  Olivia [name] is a 58 y.o. female contacted today regarding refills of specialty medication(s) Ixekizumab  (Taltz )   Patient requested Delivery   Delivery date: 07/15/24   Verified address: [address] [place], [place], [address]   Medication will be filled on 07/14/24.

## 2024-07-12 ENCOUNTER — Ambulatory Visit (INDEPENDENT_AMBULATORY_CARE_PROVIDER_SITE_OTHER): Admitting: Psychology

## 2024-07-12 DIAGNOSIS — F330 Major depressive disorder, recurrent, mild: Secondary | ICD-10-CM

## 2024-07-12 DIAGNOSIS — F900 Attention-deficit hyperactivity disorder, predominantly inattentive type: Secondary | ICD-10-CM | POA: Diagnosis not present

## 2024-07-12 DIAGNOSIS — F411 Generalized anxiety disorder: Secondary | ICD-10-CM

## 2024-07-12 NOTE — Progress Notes (Signed)
 PROGRESS NOTE:   Name: Olivia [name] Date: 07/12/2024 MRN: [id number] DOB: 1965/12/10 PCP: Olivia Lavern CROME, MD  Time spent: 4:00 PM - 4:58 PM  Annual Review: 05/31/2025  Today I met with  Olivia [name] in remote video (Caregility) face-to-face individual psychotherapy.  Distance Site: [name]: Olivia [name] Remote Office Consent: Obtained verbal consent to transmit session remotely.   Patient is aware of the limitations inherent in participating in virtual therapy.   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 She is normally a high functioning and driven individual and it is a struggle to get motivated to do anything. If it is necessary for work she does it and does a good job, but it is not the same. She has gained 70 pounds in less than 9 months and it has impacted her self image. She is also peri menopausal and these hormonal changes are also likely to be contributing to her changing mood states.   Background History: Olivia [name] is contending with aging parents.  Her mother was diagnosed this year with vascular dementia, and her father had quadrupel by-pass surgery nearly two years ago, but continues to have other medical complications, and was most recently diagnosed with cancer. She is the medical point person for her family.  She is the main person who is supporting and providing guidance to her aging parents.  Olivia [name] has one sister, but her sister struggles to manage her own life and that of her daughter.   Olivia [name] states that she was diagnosed with adult ADHD and responded well to medications.  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)   Son: Olivia [name] (17) he is on the ASD, he is a Holiday representative and attends [name]   Mental Status Exam: Appearance:   Casual     Behavior:  Appropriate  Motor:  Normal   Speech/Language:   NA  Affect:  Appropriate  Mood:  normal  Thought process:  normal  Thought content:    WNL  Sensory/Perceptual disturbances:    WNL  Orientation:  oriented to person, place, time/date, situation, and day of week  Attention:  Good  Concentration:  Good  Memory:  WNL  Fund of knowledge:   Good  Insight:    Good  Judgment:   Good  Impulse Control:  Good        Individualized Treatment Plan                Strengths: bright, verbal, motivated, self aware, self advocate   Supports: spouse, family, friends, colleagues   Goal/Needs for Treatment:  In order of importance to patient  1) Develop healthy interpersonal relationships that lead to the alleviation and help prevent the relapse of depression.  2) Learn and implement coping skills that result in a reduction of anxiety and worry, and improved functioning.   3) Combine skills learned in therapy into a new daily approach to managing ADHD.   Client Statement of Needs: requests assistance with piecing back together her life as it once was before she got depressed (burnt out).   Treatment Level: Weekly Individual Outpatient Psychotherapy.  Symptoms: autonomic hyperactivity (e.g., palpitations, shortness of breath, dry mouth, trouble swallowing, nausea, diarrhea).  Childhood history of Attention Deficit Disorder (ADD) that was either diagnosed or later concluded due to the symptoms of behavioral problems at school, impulsivity,  temper outbursts, and lack of concentration.  Depressed or irritable mood. Diminished interest in or enjoyment of activities. Easily distracted and drawn from task at hand.  Excessive and/or unrealistic worry that is difficult to control occurring more days than not for at least 6 months about a number of events or activities.  Feelings of hopelessness, worthlessness, or inappropriate guilt.  Lack of energy.   Poor concentration and indecisiveness.  Restless and fidgety; unable to be  sedentary for more than a short time.   Client Treatment Preferences: Continue with current therapist   Healthcare consumer's goal for treatment:  Psychologist, Olivia [name], Ph.D. will support the patient's ability to achieve the goals identified. Cognitive Behavioral Therapy, Dialectical Behavioral Therapy, Motivational Interviewing, SPACE, parent training, and other evidenced-based practices will be used to promote progress towards healthy functioning.   Healthcare consumer Olivia [name] will: Actively participate in therapy, working towards healthy functioning.    *Justification for Continuation/Discontinuation of Goal: R=Revised, O=Ongoing, A=Achieved, D=Discontinued  Goal 1) Develop healthy interpersonal relationships that lead to the alleviation and help prevent the relapse of depression.  5 Point Likert rating baseline date: 05/28/2021 Target Date Goal Was reviewed Status Code Progress towards goal/Likert rating  05/29/2023 05/28/2022         O 3/5 - learning to set better limits and boundaries, still hasn't been able to regulate her daily routines  05/25/2024 05/26/2023         O        3.75 - pt reestablished friendship but has not be consistent in reaching out, has improved her relationship with her parents with appropriate limits.  Is more accepting of her relationship with her estranged daughter as healthy for her and not a failure   05/31/2025 05/31/2024  4/5 - pt has established some new friendships, and is better at planning social events in order to spend time with these new friends   Goal 2) Learn and implement coping skills that result in a reduction of anxiety and worry, and improved daily functioning.  5 Point Likert rating baseline date: 05/28/2021  Target Date Goal Was reviewed Status Code Progress towards goal/Likert rating  05/29/2023 05/28/2022          O 2/5 - learning to use her skills more consistently  05/25/2024 05/26/2023          O 3.5 - pt is learning to use  her skills and better manage her unrealistic and perfectionist tendencies that keep her stressed and anxious  05/31/2025 05/31/2024          O 4/5 - pt is able to head off the anxiety spirals that used to over take her,     Goal 3) Combine skills learned in therapy into a new daily approach to managing ADHD.  5 Point Likert rating baseline date: 05/28/2021  Target Date Goal Was reviewed Status Code Progress towards goal/Likert rating  05/29/2023 05/28/2022          O 2/5 - learning to use her skills more consistently  05/25/2024 05/26/2023          O 3.75 - accepted the need and began medication, has been more aware, recognize, acknowledge and accept the issues related ADHD and is better able to apply strategies. Pt is kinder to herself around her limitations or need for an alternative way of approaching tasks  05/31/2025 05/31/2024          O 3.25/5 - pt feels like she has regressed some, returning  to past patterns of avoidance and procrastination   This plan has been reviewed and created by the following participants:  This plan will be reviewed at least every 12 months. Date Behavioral Olivia Clinician Date Guardian/Patient   05/28/2022 Olivia [name], Ph.D.   05/28/2022 Olivia Olivia [name]  05/26/2023 Olivia [name], Ph.D.  05/26/2023 Olivia Olivia [name]  05/31/2024 Olivia [name], Ph.D. 05/31/2024 Olivia Olivia [name]         Diagnosis: Major Depressive Disorder, recurrent, mild Generalized Anxiety Disorder Attention Deficit Disorder, predominantly inattentive   Olivia [name] reports that she had a terrible week.  She states that she was physically ill and has been out of work this week.  Olivia [name] finally got a call to schedule a fitting for her new Bpap machine.  We d/p some discomfort she had with the staff, what she did to manage the situation, and steps forward.  She continues to socialize with friends while gaming and it has been a food way to decompress on the weekend.   Olivia Jenkins Sprung,  PhD  Son: Spurgeon (17) he is on the ASD Friend - Amy  Super Ego -

## 2024-07-14 ENCOUNTER — Other Ambulatory Visit: Payer: Self-pay

## 2024-07-16 ENCOUNTER — Other Ambulatory Visit (INDEPENDENT_AMBULATORY_CARE_PROVIDER_SITE_OTHER)

## 2024-07-16 DIAGNOSIS — E890 Postprocedural hypothyroidism: Secondary | ICD-10-CM | POA: Diagnosis not present

## 2024-07-16 DIAGNOSIS — Z9089 Acquired absence of other organs: Secondary | ICD-10-CM | POA: Diagnosis not present

## 2024-07-16 DIAGNOSIS — G4733 Obstructive sleep apnea (adult) (pediatric): Secondary | ICD-10-CM | POA: Diagnosis not present

## 2024-07-16 DIAGNOSIS — Z9889 Other specified postprocedural states: Secondary | ICD-10-CM

## 2024-07-16 LAB — T4, FREE: Free T4: 0.99 ng/dL (ref 0.60–1.60)

## 2024-07-16 LAB — TSH: TSH: <0.01 u[IU]/mL — ABNORMAL LOW (ref 0.35–5.50)

## 2024-07-17 LAB — T3: T3, Total: 137 ng/dL (ref 76–181)

## 2024-07-18 ENCOUNTER — Other Ambulatory Visit: Payer: Self-pay | Admitting: Family

## 2024-07-18 ENCOUNTER — Ambulatory Visit: Payer: Self-pay | Admitting: Family

## 2024-07-18 MED ORDER — LEVOTHYROXINE SODIUM 75 MCG PO TABS
75.0000 ug | ORAL_TABLET | Freq: Every day | ORAL | 3 refills | Status: DC
  Filled 2024-07-18 – 2024-07-27 (×2): qty 30, 30d supply, fill #0
  Filled 2024-09-01: qty 30, 30d supply, fill #1

## 2024-07-18 NOTE — Progress Notes (Signed)
 Can we add on a free t3?

## 2024-07-19 ENCOUNTER — Other Ambulatory Visit: Payer: Self-pay

## 2024-07-19 ENCOUNTER — Ambulatory Visit (INDEPENDENT_AMBULATORY_CARE_PROVIDER_SITE_OTHER): Admitting: Psychology

## 2024-07-19 ENCOUNTER — Other Ambulatory Visit (HOSPITAL_COMMUNITY): Payer: Self-pay

## 2024-07-19 DIAGNOSIS — F900 Attention-deficit hyperactivity disorder, predominantly inattentive type: Secondary | ICD-10-CM | POA: Diagnosis not present

## 2024-07-19 DIAGNOSIS — Z9889 Other specified postprocedural states: Secondary | ICD-10-CM

## 2024-07-19 DIAGNOSIS — F330 Major depressive disorder, recurrent, mild: Secondary | ICD-10-CM

## 2024-07-19 DIAGNOSIS — F411 Generalized anxiety disorder: Secondary | ICD-10-CM | POA: Diagnosis not present

## 2024-07-19 DIAGNOSIS — F320 Major depressive disorder, single episode, mild: Secondary | ICD-10-CM

## 2024-07-19 LAB — T3, FREE: T3, Free: 4 pg/mL (ref 2.3–4.2)

## 2024-07-19 NOTE — Progress Notes (Signed)
 Lab has been added.

## 2024-07-19 NOTE — Progress Notes (Signed)
 See mychart note Dear Olivia [name], Hi [place], I have returned to the office and have reviewed your thyroid  test.   I have looked at your Free T4 and Free T3. Your Free T3 is nearing high and so my suggestion might be to stop the cytomel .  I know you were instructed to decrease the levothyrxoine; this could help as well.   Let me know your thoughts. Sincerely, Dr. Jodie

## 2024-07-19 NOTE — Progress Notes (Signed)
 PROGRESS NOTE:   Name: Olivia [name] Date: 07/19/2024 MRN: [id number] DOB: [birth year] PCP: Jodie Lavern CROME, MD  Time spent: 4:00 PM - 4:58 PM  Annual Review: 05/31/2025  Today I met with  Olivia [name] in remote video (Caregility) face-to-face individual psychotherapy.  Distance Site: [name]: Dr Edison Remote Office Consent: Obtained verbal consent to transmit session remotely.   Patient is aware of the limitations inherent in participating in virtual therapy.   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 She is normally a high functioning and driven individual and it is a struggle to get motivated to do anything. If it is necessary for work she does it and does a good job, but it is not the same. She has gained 70 pounds in less than 9 months and it has impacted her self image. She is also peri menopausal and these hormonal changes are also likely to be contributing to her changing mood states.   Background History: Olivia [name] is contending with aging parents.  Her mother was diagnosed this year with vascular dementia, and her father had quadrupel by-pass surgery nearly two years ago, but continues to have other medical complications, and was most recently diagnosed with cancer. She is the medical point person for her family.  She is the main person who is supporting and providing guidance to her aging parents.  Olivia [name] has one sister, but her sister struggles to manage her own life and that of her daughter.   Olivia [name] states that she was diagnosed with adult ADHD and responded well to medications.  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)   Son: Olivia [name] (17) he is on the ASD, he is a Holiday representative and attends [employer]   Mental Status Exam: Appearance:   Casual     Behavior:  Appropriate  Motor:  Normal   Speech/Language:   NA  Affect:  Appropriate  Mood:  normal  Thought process:  normal  Thought content:    WNL  Sensory/Perceptual disturbances:    WNL  Orientation:  oriented to person, place, time/date, situation, and day of week  Attention:  Good  Concentration:  Good  Memory:  WNL  Fund of knowledge:   Good  Insight:    Good  Judgment:   Good  Impulse Control:  Good        Individualized Treatment Plan                Strengths: bright, verbal, motivated, self aware, self advocate   Supports: spouse, family, friends, colleagues   Goal/Needs for Treatment:  In order of importance to patient  1) Develop healthy interpersonal relationships that lead to the alleviation and help prevent the relapse of depression.  2) Learn and implement coping skills that result in a reduction of anxiety and worry, and improved functioning.   3) Combine skills learned in therapy into a new daily approach to managing ADHD.   Client Statement of Needs: requests assistance with piecing back together her life as it once was before she got depressed (burnt out).   Treatment Level: Weekly Individual Outpatient Psychotherapy.  Symptoms: autonomic hyperactivity (e.g., palpitations, shortness of breath, dry mouth, trouble swallowing, nausea, diarrhea).  Childhood history of Attention Deficit Disorder (ADD) that was either diagnosed or later concluded due to the symptoms of behavioral problems at school, impulsivity,  temper outbursts, and lack of concentration.  Depressed or irritable mood. Diminished interest in or enjoyment of activities. Easily distracted and drawn from task at hand.  Excessive and/or unrealistic worry that is difficult to control occurring more days than not for at least 6 months about a number of events or activities.  Feelings of hopelessness, worthlessness, or inappropriate guilt.  Lack of energy.   Poor concentration and indecisiveness.  Restless and fidgety; unable to be  sedentary for more than a short time.   Client Treatment Preferences: Continue with current therapist   Healthcare consumer's goal for treatment:  Psychologist, Olivia [name], Ph.D. will support the patient's ability to achieve the goals identified. Cognitive Behavioral Therapy, Dialectical Behavioral Therapy, Motivational Interviewing, SPACE, parent training, and other evidenced-based practices will be used to promote progress towards healthy functioning.   Healthcare consumer Olivia [name] will: Actively participate in therapy, working towards healthy functioning.    *Justification for Continuation/Discontinuation of Goal: R=Revised, O=Ongoing, A=Achieved, D=Discontinued  Goal 1) Develop healthy interpersonal relationships that lead to the alleviation and help prevent the relapse of depression.  5 Point Likert rating baseline date: 05/28/2021 Target Date Goal Was reviewed Status Code Progress towards goal/Likert rating  05/29/2023 05/28/2022         O 3/5 - learning to set better limits and boundaries, still hasn't been able to regulate her daily routines  05/25/2024 05/26/2023         O        3.75 - pt reestablished friendship but has not be consistent in reaching out, has improved her relationship with her parents with appropriate limits.  Is more accepting of her relationship with her estranged daughter as healthy for her and not a failure   05/31/2025 05/31/2024  4/5 - pt has established some new friendships, and is better at planning social events in order to spend time with these new friends   Goal 2) Learn and implement coping skills that result in a reduction of anxiety and worry, and improved daily functioning.  5 Point Likert rating baseline date: 05/28/2021  Target Date Goal Was reviewed Status Code Progress towards goal/Likert rating  05/29/2023 05/28/2022          O 2/5 - learning to use her skills more consistently  05/25/2024 05/26/2023          O 3.5 - pt is learning to use  her skills and better manage her unrealistic and perfectionist tendencies that keep her stressed and anxious  05/31/2025 05/31/2024          O 4/5 - pt is able to head off the anxiety spirals that used to over take her,     Goal 3) Combine skills learned in therapy into a new daily approach to managing ADHD.  5 Point Likert rating baseline date: 05/28/2021  Target Date Goal Was reviewed Status Code Progress towards goal/Likert rating  05/29/2023 05/28/2022          O 2/5 - learning to use her skills more consistently  05/25/2024 05/26/2023          O 3.75 - accepted the need and began medication, has been more aware, recognize, acknowledge and accept the issues related ADHD and is better able to apply strategies. Pt is kinder to herself around her limitations or need for an alternative way of approaching tasks  05/31/2025 05/31/2024          O 3.25/5 - pt feels like she has regressed some, returning  to past patterns of avoidance and procrastination   This plan has been reviewed and created by the following participants:  This plan will be reviewed at least every 12 months. Date Behavioral Olivia Clinician Date Guardian/Patient   05/28/2022 Olivia [name], Ph.D.   05/28/2022 Olivia [name]  05/26/2023 Olivia [name], Ph.D.  05/26/2023 Olivia Olivia [name]  05/31/2024 Olivia [name], Ph.D. 05/31/2024 Olivia [name]         Diagnosis: Major Depressive Disorder, recurrent, mild Generalized Anxiety Disorder Attention Deficit Disorder, predominantly inattentive   Olivia [name] reports that she got her Bpap machine this past week.  We d/p the difficulties she is having in getting used to the mask, having more energy, and needing to make adjustments in order to tolerate the mask at night.  Olivia [name] states that  father continues to have issues with his heart and had to wear a halter monitor for a week.  We d/p what occurred, medical findings, feeling anxious about her father's Olivia and level of  exhaustion.  We also d/p an incident with her mother when she got home, managing her mother's cognitive decline and mental instability (anger, anxiety, paranoia), and being brought to tears by her mother's behavior.  I made connections between these events and past trauma, provided support, as well as guidance for how to process this new layer of trauma.  I encouraged her to use these EMDR techniques to help her self regulate whenever these memories get triggered.  Lastly, we d/ ways to maneuver her mother when she is in a state of agitation, self sooth and reset this week.  Olivia Jenkins Sprung, Olivia [name]  Son: Olivia [name] (17) he is on the ASD Daughter:  Olivia [name]  [employer] -

## 2024-07-21 NOTE — Telephone Encounter (Signed)
 Any recommendations?

## 2024-07-23 ENCOUNTER — Other Ambulatory Visit: Payer: Self-pay

## 2024-07-24 ENCOUNTER — Other Ambulatory Visit (HOSPITAL_COMMUNITY): Payer: Self-pay

## 2024-07-24 MED ORDER — LIOTHYRONINE SODIUM 25 MCG PO TABS
25.0000 ug | ORAL_TABLET | Freq: Every day | ORAL | 3 refills | Status: DC
  Filled 2024-07-24 – 2024-07-27 (×2): qty 90, 90d supply, fill #0

## 2024-07-24 NOTE — Addendum Note (Signed)
 Addended by: JODIE GAMMONS on: 07/24/2024 08:39 AM   Modules accepted: Orders

## 2024-07-26 ENCOUNTER — Ambulatory Visit: Admitting: Psychology

## 2024-07-26 DIAGNOSIS — F330 Major depressive disorder, recurrent, mild: Secondary | ICD-10-CM

## 2024-07-26 DIAGNOSIS — F900 Attention-deficit hyperactivity disorder, predominantly inattentive type: Secondary | ICD-10-CM

## 2024-07-26 DIAGNOSIS — F411 Generalized anxiety disorder: Secondary | ICD-10-CM | POA: Diagnosis not present

## 2024-07-26 NOTE — Progress Notes (Signed)
 PROGRESS NOTE:   Name: Olivia [name] Date: 07/26/2024 MRN: [id number] DOB: Mar 09, 1966 PCP: Jodie Lavern CROME, MD  Time spent: 4:00 PM - 4:58 PM  Annual Review: 05/31/2025  Today I met with  Olivia [name] in remote video (Caregility) face-to-face individual psychotherapy.  Distance Site: [name]: Dr Edison Remote Office Consent: Obtained verbal consent to transmit session remotely.   Patient is aware of the limitations inherent in participating in virtual therapy.   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 She is normally a high functioning and driven individual and it is a struggle to get motivated to do anything. If it is necessary for work she does it and does a good job, but it is not the same. She has gained 70 pounds in less than 9 months and it has impacted her self image. She is also peri menopausal and these hormonal changes are also likely to be contributing to her changing mood states.   Background History: Olivia [name] is contending with aging parents.  Her mother was diagnosed this year with vascular dementia, and her father had quadrupel by-pass surgery nearly two years ago, but continues to have other medical complications, and was most recently diagnosed with cancer. She is the medical point person for her family.  She is the main person who is supporting and providing guidance to her aging parents.  Olivia [name] has one sister, but her sister struggles to manage her own life and that of her daughter.   Christianna states that she was diagnosed with adult ADHD and responded well to medications.  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)   Son: Health visitor (17) he is on the ASD, he is a Holiday representative and attends [employer]   Mental Status Exam: Appearance:   Casual     Behavior:  Appropriate  Motor:   Normal  Speech/Language:   NA  Affect:  Appropriate  Mood:  normal  Thought process:  normal  Thought content:    WNL  Sensory/Perceptual disturbances:    WNL  Orientation:  oriented to person, place, time/date, situation, and day of week  Attention:  Good  Concentration:  Good  Memory:  WNL  Fund of knowledge:   Good  Insight:    Good  Judgment:   Good  Impulse Control:  Good        Individualized Treatment Plan                Strengths: bright, verbal, motivated, self aware, self advocate   Supports: spouse, family, friends, colleagues   Goal/Needs for Treatment:  In order of importance to patient  1) Develop healthy interpersonal relationships that lead to the alleviation and help prevent the relapse of depression.  2) Learn and implement coping skills that result in a reduction of anxiety and worry, and improved functioning.   3) Combine skills learned in therapy into a new daily approach to managing ADHD.   Client Statement of Needs: requests assistance with piecing back together her life as it once was before she got depressed (burnt out).   Treatment Level: Weekly Individual Outpatient Psychotherapy.  Symptoms: autonomic hyperactivity (e.g., palpitations, shortness of breath, dry mouth, trouble swallowing, nausea, diarrhea).  Childhood history of Attention Deficit Disorder (ADD) that was either diagnosed or later concluded due to the symptoms of behavioral problems  at school, impulsivity, temper outbursts, and lack of concentration.  Depressed or irritable mood. Diminished interest in or enjoyment of activities. Easily distracted and drawn from task at hand.  Excessive and/or unrealistic worry that is difficult to control occurring more days than not for at least 6 months about a number of events or activities.  Feelings of hopelessness, worthlessness, or inappropriate guilt.  Lack of energy.   Poor concentration and indecisiveness.  Restless and fidgety; unable  to be sedentary for more than a short time.   Client Treatment Preferences: Continue with current therapist   Healthcare consumer's goal for treatment:  Psychologist, Ronal Jenkins Sprung, Ph.D. will support the patient's ability to achieve the goals identified. Cognitive Behavioral Therapy, Dialectical Behavioral Therapy, Motivational Interviewing, SPACE, parent training, and other evidenced-based practices will be used to promote progress towards healthy functioning.   Healthcare consumer Olivia [name] will: Actively participate in therapy, working towards healthy functioning.    *Justification for Continuation/Discontinuation of Goal: R=Revised, O=Ongoing, A=Achieved, D=Discontinued  Goal 1) Develop healthy interpersonal relationships that lead to the alleviation and help prevent the relapse of depression.  5 Point Likert rating baseline date: 05/28/2021 Target Date Goal Was reviewed Status Code Progress towards goal/Likert rating  05/29/2023 05/28/2022         O 3/5 - learning to set better limits and boundaries, still hasn't been able to regulate her daily routines  05/25/2024 05/26/2023         O        3.75 - pt reestablished friendship but has not be consistent in reaching out, has improved her relationship with her parents with appropriate limits.  Is more accepting of her relationship with her estranged daughter as healthy for her and not a failure   05/31/2025 05/31/2024  4/5 - pt has established some new friendships, and is better at planning social events in order to spend time with these new friends   Goal 2) Learn and implement coping skills that result in a reduction of anxiety and worry, and improved daily functioning.  5 Point Likert rating baseline date: 05/28/2021  Target Date Goal Was reviewed Status Code Progress towards goal/Likert rating  05/29/2023 05/28/2022          O 2/5 - learning to use her skills more consistently  05/25/2024 05/26/2023          O 3.5 - pt is learning  to use her skills and better manage her unrealistic and perfectionist tendencies that keep her stressed and anxious  05/31/2025 05/31/2024          O 4/5 - pt is able to head off the anxiety spirals that used to over take her,     Goal 3) Combine skills learned in therapy into a new daily approach to managing ADHD.  5 Point Likert rating baseline date: 05/28/2021  Target Date Goal Was reviewed Status Code Progress towards goal/Likert rating  05/29/2023 05/28/2022          O 2/5 - learning to use her skills more consistently  05/25/2024 05/26/2023          O 3.75 - accepted the need and began medication, has been more aware, recognize, acknowledge and accept the issues related ADHD and is better able to apply strategies. Pt is kinder to herself around her limitations or need for an alternative way of approaching tasks  05/31/2025 05/31/2024          O 3.25/5 - pt feels like she has  regressed some, returning to past patterns of avoidance and procrastination   This plan has been reviewed and created by the following participants:  This plan will be reviewed at least every 12 months. Date Behavioral Health Clinician Date Guardian/Patient   05/28/2022 Ronal Jenkins Sprung, Ph.D.   05/28/2022 Olivia Olivia [name] [name]  05/26/2023 Ronal Jenkins Sprung, Ph.D.  05/26/2023 Olivia Olivia [name] Dougher  05/31/2024 Ronal Jenkins Sprung, Ph.D. 05/31/2024 Olivia Olivia [name] [name]         Diagnosis: Major Depressive Disorder, recurrent, mild Generalized Anxiety Disorder Attention Deficit Disorder, predominantly inattentive   Olivia [name] reports that she continues to struggle with the fit of her B Pap mask.  However, she is still managing to feel more rested despite all the difficulties.  Olivia [name] states that she had a couple of big stressors come up at work.  We d/p what occurred, how she felt, managing her anxious thoughts, managing feeling over responsible and learning to be comfortable not dropping everything to rescue the situation.    Ronal Jenkins Sprung, PhD  Son: Spurgeon (17) he is on the ASD Daughter:  Friend - Amy  Super Ego -

## 2024-07-27 ENCOUNTER — Ambulatory Visit (INDEPENDENT_AMBULATORY_CARE_PROVIDER_SITE_OTHER)

## 2024-07-27 ENCOUNTER — Other Ambulatory Visit (HOSPITAL_BASED_OUTPATIENT_CLINIC_OR_DEPARTMENT_OTHER): Payer: Self-pay

## 2024-07-27 ENCOUNTER — Other Ambulatory Visit (HOSPITAL_COMMUNITY): Payer: Self-pay

## 2024-07-27 DIAGNOSIS — R0602 Shortness of breath: Secondary | ICD-10-CM | POA: Diagnosis not present

## 2024-07-27 LAB — ECHOCARDIOGRAM COMPLETE
Area-P 1/2: 3.77 cm2
S' Lateral: 2.72 cm

## 2024-07-27 MED ORDER — COMIRNATY 30 MCG/0.3ML IM SUSY
0.3000 mL | PREFILLED_SYRINGE | Freq: Once | INTRAMUSCULAR | 0 refills | Status: AC
  Filled 2024-07-27: qty 0.3, 1d supply, fill #0

## 2024-07-28 ENCOUNTER — Ambulatory Visit: Payer: Self-pay | Admitting: Adult Health

## 2024-07-28 ENCOUNTER — Other Ambulatory Visit (HOSPITAL_COMMUNITY): Payer: Self-pay

## 2024-08-02 ENCOUNTER — Ambulatory Visit (INDEPENDENT_AMBULATORY_CARE_PROVIDER_SITE_OTHER): Admitting: Psychology

## 2024-08-02 DIAGNOSIS — F324 Major depressive disorder, single episode, in partial remission: Secondary | ICD-10-CM

## 2024-08-02 DIAGNOSIS — F411 Generalized anxiety disorder: Secondary | ICD-10-CM | POA: Diagnosis not present

## 2024-08-02 DIAGNOSIS — Z600 Problems of adjustment to life-cycle transitions: Secondary | ICD-10-CM

## 2024-08-02 DIAGNOSIS — F900 Attention-deficit hyperactivity disorder, predominantly inattentive type: Secondary | ICD-10-CM | POA: Diagnosis not present

## 2024-08-02 NOTE — Progress Notes (Signed)
 PROGRESS NOTE:   Name: [name] Date: 08/02/2024 MRN: [id number] DOB: 30-Jun-1966 PCP: Jodie Lavern CROME, MD  Time spent: 4:00 PM - 4:58 PM  Annual Review: 05/31/2025  Today I met with  Olivia [name] in remote video (Caregility) face-to-face individual psychotherapy.  Distance Site: [name]: Dr Edison Remote Office Consent: Obtained verbal consent to transmit session remotely.   Patient is aware of the limitations inherent in participating in virtual therapy.   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 She is normally a high functioning and driven individual and it is a struggle to get motivated to do anything. If it is necessary for work she does it and does a good job, but it is not the same. She has gained 70 pounds in less than 9 months and it has impacted her self image. She is also peri menopausal and these hormonal changes are also likely to be contributing to her changing mood states.   Background History: Olivia [name] is contending with aging parents.  Her mother was diagnosed this year with vascular dementia, and her father had quadrupel by-pass surgery nearly two years ago, but continues to have other medical complications, and was most recently diagnosed with cancer. She is the medical point person for her family.  She is the main person who is supporting and providing guidance to her aging parents.  Olivia [name] has one sister, but her sister struggles to manage her own life and that of her daughter.   [name] states that she was diagnosed with adult ADHD and responded well to medications.  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)   Son: Health visitor (17) he is on the ASD, he is a Holiday representative and attends [employer]   Mental Status Exam: Appearance:   Casual     Behavior:  Appropriate  Motor:   Normal  Speech/Language:   NA  Affect:  Appropriate  Mood:  normal  Thought process:  normal  Thought content:    WNL  Sensory/Perceptual disturbances:    WNL  Orientation:  oriented to person, place, time/date, situation, and day of week  Attention:  Good  Concentration:  Good  Memory:  WNL  Fund of knowledge:   Good  Insight:    Good  Judgment:   Good  Impulse Control:  Good        Individualized Treatment Plan                Strengths: bright, verbal, motivated, self aware, self advocate   Supports: spouse, family, friends, colleagues   Goal/Needs for Treatment:  In order of importance to patient  1) Develop healthy interpersonal relationships that lead to the alleviation and help prevent the relapse of depression.  2) Learn and implement coping skills that result in a reduction of anxiety and worry, and improved functioning.   3) Combine skills learned in therapy into a new daily approach to managing ADHD.   Client Statement of Needs: requests assistance with piecing back together her life as it once was before she got depressed (burnt out).   Treatment Level: Weekly Individual Outpatient Psychotherapy.  Symptoms: autonomic hyperactivity (e.g., palpitations, shortness of breath, dry mouth, trouble swallowing, nausea, diarrhea).  Childhood history of Attention Deficit Disorder (ADD) that was either diagnosed or later concluded due to the symptoms of behavioral  problems at school, impulsivity, temper outbursts, and lack of concentration.  Depressed or irritable mood. Diminished interest in or enjoyment of activities. Easily distracted and drawn from task at hand.  Excessive and/or unrealistic worry that is difficult to control occurring more days than not for at least 6 months about a number of events or activities.  Feelings of hopelessness, worthlessness, or inappropriate guilt.  Lack of energy.   Poor concentration and indecisiveness.  Restless and fidgety; unable  to be sedentary for more than a short time.   Client Treatment Preferences: Continue with current therapist   Healthcare consumer's goal for treatment:  Psychologist, Ronal Jenkins Sprung, Ph.D. will support the patient's ability to achieve the goals identified. Cognitive Behavioral Therapy, Dialectical Behavioral Therapy, Motivational Interviewing, SPACE, parent training, and other evidenced-based practices will be used to promote progress towards healthy functioning.   Healthcare consumer Deisha Stull will: Actively participate in therapy, working towards healthy functioning.    *Justification for Continuation/Discontinuation of Goal: R=Revised, O=Ongoing, A=Achieved, D=Discontinued  Goal 1) Develop healthy interpersonal relationships that lead to the alleviation and help prevent the relapse of depression.  5 Point Likert rating baseline date: 05/28/2021 Target Date Goal Was reviewed Status Code Progress towards goal/Likert rating  05/29/2023 05/28/2022         O 3/5 - learning to set better limits and boundaries, still hasn't been able to regulate her daily routines  05/25/2024 05/26/2023         O        3.75 - pt reestablished friendship but has not be consistent in reaching out, has improved her relationship with her parents with appropriate limits.  Is more accepting of her relationship with her estranged daughter as healthy for her and not a failure   05/31/2025 05/31/2024  4/5 - pt has established some new friendships, and is better at planning social events in order to spend time with these new friends   Goal 2) Learn and implement coping skills that result in a reduction of anxiety and worry, and improved daily functioning.  5 Point Likert rating baseline date: 05/28/2021  Target Date Goal Was reviewed Status Code Progress towards goal/Likert rating  05/29/2023 05/28/2022          O 2/5 - learning to use her skills more consistently  05/25/2024 05/26/2023          O 3.5 - pt is learning  to use her skills and better manage her unrealistic and perfectionist tendencies that keep her stressed and anxious  05/31/2025 05/31/2024          O 4/5 - pt is able to head off the anxiety spirals that used to over take her,     Goal 3) Combine skills learned in therapy into a new daily approach to managing ADHD.  5 Point Likert rating baseline date: 05/28/2021  Target Date Goal Was reviewed Status Code Progress towards goal/Likert rating  05/29/2023 05/28/2022          O 2/5 - learning to use her skills more consistently  05/25/2024 05/26/2023          O 3.75 - accepted the need and began medication, has been more aware, recognize, acknowledge and accept the issues related ADHD and is better able to apply strategies. Pt is kinder to herself around her limitations or need for an alternative way of approaching tasks  05/31/2025 05/31/2024          O 3.25/5 - pt feels like she  has regressed some, returning to past patterns of avoidance and procrastination   This plan has been reviewed and created by the following participants:  This plan will be reviewed at least every 12 months. Date Behavioral Health Clinician Date Guardian/Patient   05/28/2022 Ronal Jenkins Sprung, Ph.D.   05/28/2022 Olivia Olivia [name] [name]  05/26/2023 Ronal Jenkins Sprung, Ph.D.  05/26/2023 Olivia Olivia [name] Enge  05/31/2024 Ronal Jenkins Sprung, Ph.D. 05/31/2024 Olivia Olivia [name] [name]         Diagnosis: Major Depressive Disorder, recurrent, in partial remission Generalized Anxiety Disorder Attention Deficit Disorder, predominantly inattentive   Olivia [name] reports that the results from her ECC and they found a number of problems.  We d/ that this is a common problem with individuals who have long term untreated sleep apnea.  This discovery, helped her to put her struggles with fatigue and depression into perspective in a way that helped her to view herself more positively (i.e, there is a medical reason contributing to the struggles she has been  having).  Olivia [name] has had quite the stressful day.  We d\e\p ongoing difficulties with her sister, challenges with her parents, and juggling work and family responsibilities.  Nevertheless, after sleeping better, she states that she has had the most productive week than she has had in a very long time.  I provided support and helped her to acknowledge the hard changes she is making in her life to support more positive mood states.  Ronal Jenkins Sprung, PhD  Son: Spurgeon (17) he is on the ASD Daughter:  Friend - Amy  Super Ego -

## 2024-08-05 NOTE — Telephone Encounter (Signed)
 Delon with [employer] will be reaching out to the patient in regard to the insurance.

## 2024-08-05 NOTE — Telephone Encounter (Signed)
**Note De-identified  [place] Obfuscation** Please advise 

## 2024-08-06 ENCOUNTER — Other Ambulatory Visit: Payer: Self-pay

## 2024-08-06 NOTE — Progress Notes (Signed)
 Specialty Pharmacy Refill Coordination Note  Olivia [name] is a 58 y.o. female contacted today regarding refills of specialty medication(s) Ixekizumab  (Taltz )   Patient requested Delivery   Delivery date: 08/11/24   Verified address: [address] [place], [place], [address]   Medication will be filled on 08/10/24.

## 2024-08-09 ENCOUNTER — Ambulatory Visit: Admitting: Psychology

## 2024-08-09 ENCOUNTER — Other Ambulatory Visit: Payer: Self-pay

## 2024-08-09 DIAGNOSIS — F411 Generalized anxiety disorder: Secondary | ICD-10-CM

## 2024-08-09 DIAGNOSIS — F900 Attention-deficit hyperactivity disorder, predominantly inattentive type: Secondary | ICD-10-CM | POA: Diagnosis not present

## 2024-08-09 DIAGNOSIS — F324 Major depressive disorder, single episode, in partial remission: Secondary | ICD-10-CM | POA: Diagnosis not present

## 2024-08-09 NOTE — Progress Notes (Signed)
 PROGRESS NOTE:   Name: Olivia [name] Date: 08/09/2024 MRN: [id number] DOB: 1966/11/01 PCP: Jodie Lavern CROME, MD  Time spent: 4:00 PM - 4:58 PM  Annual Review: 05/31/2025  Today I met with  Olivia [name] in remote video (Caregility) face-to-face individual psychotherapy.  Distance Site: [name]: Dr Edison Remote Office Consent: Obtained verbal consent to transmit session remotely.   Patient is aware of the limitations inherent in participating in virtual therapy.   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 She is normally a high functioning and driven individual and it is a struggle to get motivated to do anything. If it is necessary for work she does it and does a good job, but it is not the same. She has gained 70 pounds in less than 9 months and it has impacted her self image. She is also peri menopausal and these hormonal changes are also likely to be contributing to her changing mood states.   Background History: Olivia [name] is contending with aging parents.  Her mother was diagnosed this year with vascular dementia, and her father had quadrupel by-pass surgery nearly two years ago, but continues to have other medical complications, and was most recently diagnosed with cancer. She is the medical point person for her family.  She is the main person who is supporting and providing guidance to her aging parents.  Olivia [name] has one sister, but her sister struggles to manage her own life and that of her daughter.   Ladon states that she was diagnosed with adult ADHD and responded well to medications.  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)   Son: Health visitor (17) he is on the ASD, he is a Holiday representative and attends [employer]   Mental Status Exam: Appearance:   Casual     Behavior:  Appropriate  Motor:   Normal  Speech/Language:   NA  Affect:  Appropriate  Mood:  normal  Thought process:  normal  Thought content:    WNL  Sensory/Perceptual disturbances:    WNL  Orientation:  oriented to person, place, time/date, situation, and day of week  Attention:  Good  Concentration:  Good  Memory:  WNL  Fund of knowledge:   Good  Insight:    Good  Judgment:   Good  Impulse Control:  Good        Individualized Treatment Plan                Strengths: bright, verbal, motivated, self aware, self advocate   Supports: spouse, family, friends, colleagues   Goal/Needs for Treatment:  In order of importance to patient  1) Develop healthy interpersonal relationships that lead to the alleviation and help prevent the relapse of depression.  2) Learn and implement coping skills that result in a reduction of anxiety and worry, and improved functioning.   3) Combine skills learned in therapy into a new daily approach to managing ADHD.   Client Statement of Needs: requests assistance with piecing back together her life as it once was before she got depressed (burnt out).   Treatment Level: Weekly Individual Outpatient Psychotherapy.  Symptoms: autonomic hyperactivity (e.g., palpitations, shortness of breath, dry mouth, trouble swallowing, nausea, diarrhea).  Childhood history of Attention Deficit Disorder (ADD) that was either diagnosed or later concluded due to the symptoms of  behavioral problems at school, impulsivity, temper outbursts, and lack of concentration.  Depressed or irritable mood. Diminished interest in or enjoyment of activities. Easily distracted and drawn from task at hand.  Excessive and/or unrealistic worry that is difficult to control occurring more days than not for at least 6 months about a number of events or activities.  Feelings of hopelessness, worthlessness, or inappropriate guilt.  Lack of energy.   Poor concentration and indecisiveness.  Restless and fidgety; unable  to be sedentary for more than a short time.   Client Treatment Preferences: Continue with current therapist   Healthcare consumer's goal for treatment:  Psychologist, Olivia [name], Ph.D. will support the patient's ability to achieve the goals identified. Cognitive Behavioral Therapy, Dialectical Behavioral Therapy, Motivational Interviewing, SPACE, parent training, and other evidenced-based practices will be used to promote progress towards healthy functioning.   Healthcare consumer Olivia [name] will: Actively participate in therapy, working towards healthy functioning.    *Justification for Continuation/Discontinuation of Goal: R=Revised, O=Ongoing, A=Achieved, D=Discontinued  Goal 1) Develop healthy interpersonal relationships that lead to the alleviation and help prevent the relapse of depression.  5 Point Likert rating baseline date: 05/28/2021 Target Date Goal Was reviewed Status Code Progress towards goal/Likert rating  05/29/2023 05/28/2022         O 3/5 - learning to set better limits and boundaries, still hasn't been able to regulate her daily routines  05/25/2024 05/26/2023         O        3.75 - pt reestablished friendship but has not be consistent in reaching out, has improved her relationship with her parents with appropriate limits.  Is more accepting of her relationship with her estranged daughter as healthy for her and not a failure   05/31/2025 05/31/2024  4/5 - pt has established some new friendships, and is better at planning social events in order to spend time with these new friends   Goal 2) Learn and implement coping skills that result in a reduction of anxiety and worry, and improved daily functioning.  5 Point Likert rating baseline date: 05/28/2021  Target Date Goal Was reviewed Status Code Progress towards goal/Likert rating  05/29/2023 05/28/2022          O 2/5 - learning to use her skills more consistently  05/25/2024 05/26/2023          O 3.5 - pt is learning  to use her skills and better manage her unrealistic and perfectionist tendencies that keep her stressed and anxious  05/31/2025 05/31/2024          O 4/5 - pt is able to head off the anxiety spirals that used to over take her,     Goal 3) Combine skills learned in therapy into a new daily approach to managing ADHD.  5 Point Likert rating baseline date: 05/28/2021  Target Date Goal Was reviewed Status Code Progress towards goal/Likert rating  05/29/2023 05/28/2022          O 2/5 - learning to use her skills more consistently  05/25/2024 05/26/2023          O 3.75 - accepted the need and began medication, has been more aware, recognize, acknowledge and accept the issues related ADHD and is better able to apply strategies. Pt is kinder to herself around her limitations or need for an alternative way of approaching tasks  05/31/2025 05/31/2024          O 3.25/5 - pt feels like  she has regressed some, returning to past patterns of avoidance and procrastination   This plan has been reviewed and created by the following participants:  This plan will be reviewed at least every 12 months. Date Behavioral Health Clinician Date Guardian/Patient   05/28/2022 Olivia [name], Ph.D.   05/28/2022 Olivia [name]  05/26/2023 Olivia [name], Ph.D.  05/26/2023 Olivia [name]  05/31/2024 Olivia [name], Ph.D. 05/31/2024 Olivia [name]         Diagnosis: Major Depressive Disorder, recurrent, in partial remission Generalized Anxiety Disorder Attention Deficit Disorder, predominantly inattentive   Olivia [name] reports that she is still struggling with the fit of her Cpap mask, but it's getting easier to tolerate.  Her sleep and stamina are better.  We d/e/p the realization that she raised herself, raised her kids, and now she is caring for her parents.  She is concerned about her father who is struggling not to get angry with her mother.  We d/p understanding her father's struggles, needed to redirect him  to talk to his therapist, and not borrowing his feelings of guilt.  Lastly, we d/p a big problem that's come to her attention that could threaten hers and her teams credentialing through no fault of their own.    Olivia Jenkins Sprung, PhD  Son: Olivia [name] (17) he is on the ASD Daughter:  Olivia [name]  Super Ego -

## 2024-08-10 ENCOUNTER — Other Ambulatory Visit: Payer: Self-pay

## 2024-08-11 ENCOUNTER — Other Ambulatory Visit: Payer: Self-pay

## 2024-08-12 DIAGNOSIS — Z1211 Encounter for screening for malignant neoplasm of colon: Secondary | ICD-10-CM | POA: Diagnosis not present

## 2024-08-12 DIAGNOSIS — R1032 Left lower quadrant pain: Secondary | ICD-10-CM | POA: Diagnosis not present

## 2024-08-12 DIAGNOSIS — Z860100 Personal history of colon polyps, unspecified: Secondary | ICD-10-CM | POA: Diagnosis not present

## 2024-08-12 DIAGNOSIS — Z800 Family history of malignant neoplasm of digestive organs: Secondary | ICD-10-CM | POA: Diagnosis not present

## 2024-08-15 ENCOUNTER — Other Ambulatory Visit (HOSPITAL_BASED_OUTPATIENT_CLINIC_OR_DEPARTMENT_OTHER): Payer: Self-pay

## 2024-08-16 ENCOUNTER — Ambulatory Visit (INDEPENDENT_AMBULATORY_CARE_PROVIDER_SITE_OTHER): Admitting: Psychology

## 2024-08-16 ENCOUNTER — Other Ambulatory Visit: Payer: Self-pay

## 2024-08-16 DIAGNOSIS — F3341 Major depressive disorder, recurrent, in partial remission: Secondary | ICD-10-CM | POA: Diagnosis not present

## 2024-08-16 DIAGNOSIS — F411 Generalized anxiety disorder: Secondary | ICD-10-CM | POA: Diagnosis not present

## 2024-08-16 DIAGNOSIS — F900 Attention-deficit hyperactivity disorder, predominantly inattentive type: Secondary | ICD-10-CM

## 2024-08-16 NOTE — Progress Notes (Unsigned)
 PROGRESS NOTE:   Name: Olivia [name] Date: 08/16/2024 MRN: [id number] DOB: 05-14-1966 PCP: Jodie Lavern CROME, MD  Time spent: 4:00 PM - 4:58 PM  Annual Review: 05/31/2025  Today I met with  Olivia [name] in remote video (Caregility) face-to-face individual psychotherapy.  Distance Site: [name]: Dr Edison Remote Office Consent: Obtained verbal consent to transmit session remotely.   Patient is aware of the limitations inherent in participating in virtual therapy.   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 She is normally a high functioning and driven individual and it is a struggle to get motivated to do anything. If it is necessary for work she does it and does a good job, but it is not the same. She has gained 70 pounds in less than 9 months and it has impacted her self image. She is also peri menopausal and these hormonal changes are also likely to be contributing to her changing mood states.   Background History: Olivia [name] is contending with aging parents.  Her mother was diagnosed this year with vascular dementia, and her father had quadrupel by-pass surgery nearly two years ago, but continues to have other medical complications, and was most recently diagnosed with cancer. She is the medical point person for her family.  She is the main person who is supporting and providing guidance to her aging parents.  Olivia [name] has one sister, but her sister struggles to manage her own life and that of her daughter.   Olivia [name] states that she was diagnosed with adult ADHD and responded well to medications.  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)   Son: Health visitor (17) he is on the ASD, he is a Holiday representative and attends [employer]   Mental Status Exam: Appearance:   Casual     Behavior:  Appropriate   Motor:  Normal  Speech/Language:   NA  Affect:  Appropriate  Mood:  normal  Thought process:  normal  Thought content:    WNL  Sensory/Perceptual disturbances:    WNL  Orientation:  oriented to person, place, time/date, situation, and day of week  Attention:  Good  Concentration:  Good  Memory:  WNL  Fund of knowledge:   Good  Insight:    Good  Judgment:   Good  Impulse Control:  Good        Individualized Treatment Plan                Strengths: bright, verbal, motivated, self aware, self advocate   Supports: spouse, family, friends, colleagues   Goal/Needs for Treatment:  In order of importance to patient  1) Develop healthy interpersonal relationships that lead to the alleviation and help prevent the relapse of depression.  2) Learn and implement coping skills that result in a reduction of anxiety and worry, and improved functioning.   3) Combine skills learned in therapy into a new daily approach to managing ADHD.   Client Statement of Needs: requests assistance with piecing back together her life as it once was before she got depressed (burnt out).   Treatment Level: Weekly Individual Outpatient Psychotherapy.  Symptoms: autonomic hyperactivity (e.g., palpitations, shortness of breath, dry mouth, trouble swallowing, nausea, diarrhea).  Childhood history of Attention Deficit Disorder (ADD) that was either diagnosed or later concluded due to the symptoms of  behavioral problems at school, impulsivity, temper outbursts, and lack of concentration.  Depressed or irritable mood. Diminished interest in or enjoyment of activities. Easily distracted and drawn from task at hand.  Excessive and/or unrealistic worry that is difficult to control occurring more days than not for at least 6 months about a number of events or activities.  Feelings of hopelessness, worthlessness, or inappropriate guilt.  Lack of energy.   Poor concentration and indecisiveness.  Restless and  fidgety; unable to be sedentary for more than a short time.   Client Treatment Preferences: Continue with current therapist   Healthcare consumer's goal for treatment:  Psychologist, Ronal Jenkins Sprung, Ph.D. will support the patient's ability to achieve the goals identified. Cognitive Behavioral Therapy, Dialectical Behavioral Therapy, Motivational Interviewing, SPACE, parent training, and other evidenced-based practices will be used to promote progress towards healthy functioning.   Healthcare consumer Olivia [name] will: Actively participate in therapy, working towards healthy functioning.    *Justification for Continuation/Discontinuation of Goal: R=Revised, O=Ongoing, A=Achieved, D=Discontinued  Goal 1) Develop healthy interpersonal relationships that lead to the alleviation and help prevent the relapse of depression.  5 Point Likert rating baseline date: 05/28/2021 Target Date Goal Was reviewed Status Code Progress towards goal/Likert rating  05/29/2023 05/28/2022         O 3/5 - learning to set better limits and boundaries, still hasn't been able to regulate her daily routines  05/25/2024 05/26/2023         O        3.75 - pt reestablished friendship but has not be consistent in reaching out, has improved her relationship with her parents with appropriate limits.  Is more accepting of her relationship with her estranged daughter as healthy for her and not a failure   05/31/2025 05/31/2024  4/5 - pt has established some new friendships, and is better at planning social events in order to spend time with these new friends   Goal 2) Learn and implement coping skills that result in a reduction of anxiety and worry, and improved daily functioning.  5 Point Likert rating baseline date: 05/28/2021  Target Date Goal Was reviewed Status Code Progress towards goal/Likert rating  05/29/2023 05/28/2022          O 2/5 - learning to use her skills more consistently  05/25/2024 05/26/2023          O 3.5 -  pt is learning to use her skills and better manage her unrealistic and perfectionist tendencies that keep her stressed and anxious  05/31/2025 05/31/2024          O 4/5 - pt is able to head off the anxiety spirals that used to over take her,     Goal 3) Combine skills learned in therapy into a new daily approach to managing ADHD.  5 Point Likert rating baseline date: 05/28/2021  Target Date Goal Was reviewed Status Code Progress towards goal/Likert rating  05/29/2023 05/28/2022          O 2/5 - learning to use her skills more consistently  05/25/2024 05/26/2023          O 3.75 - accepted the need and began medication, has been more aware, recognize, acknowledge and accept the issues related ADHD and is better able to apply strategies. Pt is kinder to herself around her limitations or need for an alternative way of approaching tasks  05/31/2025 05/31/2024          O 3.25/5 - pt feels like  she has regressed some, returning to past patterns of avoidance and procrastination   This plan has been reviewed and created by the following participants:  This plan will be reviewed at least every 12 months. Date Behavioral Health Clinician Date Guardian/Patient   05/28/2022 Ronal Jenkins Sprung, Ph.D.   05/28/2022 Olivia [name]  05/26/2023 Ronal Jenkins Sprung, Ph.D.  05/26/2023 Olivia [name]  05/31/2024 Ronal Jenkins Sprung, Ph.D. 05/31/2024 Olivia [name]         Diagnosis: Major Depressive Disorder, recurrent, in partial remission Generalized Anxiety Disorder Attention Deficit Disorder, predominantly inattentive   Olivia [name] reports that she is still struggling with the fit of her Cpap mask, but it's getting easier to tolerate.  Her sleep and stamina are better.  We d/e/p the realization that she raised herself, raised her kids, and now she is caring for her parents.  She is concerned about her father who is struggling not to get angry with her mother.  We d/p understanding her father's struggles, needed  to redirect him to talk to his therapist, and not borrowing his feelings of guilt.  Lastly, we d/p a big problem that's come to her attention that could threaten hers and her teams credentialing through no fault of their own.    Ronal Jenkins Sprung, PhD  Son: Spurgeon (17) he is on the ASD Daughter:  Friend - Amy  Super Ego -    Ronal Jenkins Sprung, PhD

## 2024-08-18 ENCOUNTER — Other Ambulatory Visit (HOSPITAL_BASED_OUTPATIENT_CLINIC_OR_DEPARTMENT_OTHER): Payer: Self-pay

## 2024-08-18 MED ORDER — DOXYCYCLINE HYCLATE 50 MG PO CAPS
50.0000 mg | ORAL_CAPSULE | Freq: Two times a day (BID) | ORAL | 1 refills | Status: DC
  Filled 2024-08-18: qty 60, 30d supply, fill #0

## 2024-08-18 MED ORDER — CHLORHEXIDINE GLUCONATE 0.12 % MT SOLN
OROMUCOSAL | 0 refills | Status: DC
  Filled 2024-08-18: qty 473, 15d supply, fill #0

## 2024-08-19 ENCOUNTER — Other Ambulatory Visit (HOSPITAL_COMMUNITY): Payer: Self-pay

## 2024-08-19 ENCOUNTER — Other Ambulatory Visit: Payer: Self-pay

## 2024-08-23 ENCOUNTER — Ambulatory Visit (INDEPENDENT_AMBULATORY_CARE_PROVIDER_SITE_OTHER): Admitting: Psychology

## 2024-08-23 DIAGNOSIS — F3341 Major depressive disorder, recurrent, in partial remission: Secondary | ICD-10-CM | POA: Diagnosis not present

## 2024-08-23 DIAGNOSIS — F411 Generalized anxiety disorder: Secondary | ICD-10-CM | POA: Diagnosis not present

## 2024-08-23 DIAGNOSIS — F900 Attention-deficit hyperactivity disorder, predominantly inattentive type: Secondary | ICD-10-CM | POA: Diagnosis not present

## 2024-08-23 DIAGNOSIS — F324 Major depressive disorder, single episode, in partial remission: Secondary | ICD-10-CM

## 2024-08-23 NOTE — Progress Notes (Unsigned)
 PROGRESS NOTE:   Name: Olivia [name] Date: 08/23/2024 MRN: [id number] DOB: 1966/10/07 PCP: Olivia Lavern CROME, MD  Time spent: 4:00 PM - 4:58 PM  Annual Review: 05/31/2025  Today I met with  Olivia [name] in remote video (Caregility) face-to-face individual psychotherapy.  Distance Site: [name]: Dr Edison Remote Office Consent: Obtained verbal consent to transmit session remotely.   Patient is aware of the limitations inherent in participating in virtual therapy.   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 She is normally a high functioning and driven individual and it is a struggle to get motivated to do anything. If it is necessary for work she does it and does a good job, but it is not the same. She has gained 70 pounds in less than 9 months and it has impacted her self image. She is also peri menopausal and these hormonal changes are also likely to be contributing to her changing mood states.   Background History: Olivia [name] is contending with aging parents.  Her mother was diagnosed this year with vascular dementia, and her father had quadrupel by-pass surgery nearly two years ago, but continues to have other medical complications, and was most recently diagnosed with cancer. She is the medical point person for her family.  She is the main person who is supporting and providing guidance to her aging parents.  Olivia [name] has one sister, but her sister struggles to manage her own life and that of her daughter.   Olivia [name] states that she was diagnosed with adult ADHD and responded well to medications.  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)   Son: Health visitor (17) he is on the ASD, he is a Holiday representative and attends [employer]   Mental Status Exam: Appearance:   Casual     Behavior:  Appropriate  Motor:   Normal  Speech/Language:   NA  Affect:  Appropriate  Mood:  normal  Thought process:  normal  Thought content:    WNL  Sensory/Perceptual disturbances:    WNL  Orientation:  oriented to person, place, time/date, situation, and day of week  Attention:  Good  Concentration:  Good  Memory:  WNL  Fund of knowledge:   Good  Insight:    Good  Judgment:   Good  Impulse Control:  Good        Individualized Treatment Plan                Strengths: bright, verbal, motivated, self aware, self advocate   Supports: spouse, family, friends, colleagues   Goal/Needs for Treatment:  In order of importance to patient  1) Develop healthy interpersonal relationships that lead to the alleviation and help prevent the relapse of depression.  2) Learn and implement coping skills that result in a reduction of anxiety and worry, and improved functioning.   3) Combine skills learned in therapy into a new daily approach to managing ADHD.   Client Statement of Needs: requests assistance with piecing back together her life as it once was before she got depressed (burnt out).   Treatment Level: Weekly Individual Outpatient Psychotherapy.  Symptoms: autonomic hyperactivity (e.g., palpitations, shortness of breath, dry mouth, trouble swallowing, nausea, diarrhea).  Childhood history of Attention Deficit Disorder (ADD) that was either diagnosed or later concluded due to the symptoms of behavioral  problems at school, impulsivity, temper outbursts, and lack of concentration.  Depressed or irritable mood. Diminished interest in or enjoyment of activities. Easily distracted and drawn from task at hand.  Excessive and/or unrealistic worry that is difficult to control occurring more days than not for at least 6 months about a number of events or activities.  Feelings of hopelessness, worthlessness, or inappropriate guilt.  Lack of energy.   Poor concentration and indecisiveness.  Restless and fidgety; unable  to be sedentary for more than a short time.   Client Treatment Preferences: Continue with current therapist   Healthcare consumer's goal for treatment:  Psychologist, Olivia [name], Ph.D. will support the patient's ability to achieve the goals identified. Cognitive Behavioral Therapy, Dialectical Behavioral Therapy, Motivational Interviewing, SPACE, parent training, and other evidenced-based practices will be used to promote progress towards healthy functioning.   Healthcare consumer Olivia [name] will: Actively participate in therapy, working towards healthy functioning.    *Justification for Continuation/Discontinuation of Goal: R=Revised, O=Ongoing, A=Achieved, D=Discontinued  Goal 1) Develop healthy interpersonal relationships that lead to the alleviation and help prevent the relapse of depression.  5 Point Likert rating baseline date: 05/28/2021 Target Date Goal Was reviewed Status Code Progress towards goal/Likert rating  05/29/2023 05/28/2022         O 3/5 - learning to set better limits and boundaries, still hasn't been able to regulate her daily routines  05/25/2024 05/26/2023         O        3.75 - pt reestablished friendship but has not be consistent in reaching out, has improved her relationship with her parents with appropriate limits.  Is more accepting of her relationship with her estranged daughter as healthy for her and not a failure   05/31/2025 05/31/2024  4/5 - pt has established some new friendships, and is better at planning social events in order to spend time with these new friends   Goal 2) Learn and implement coping skills that result in a reduction of anxiety and worry, and improved daily functioning.  5 Point Likert rating baseline date: 05/28/2021  Target Date Goal Was reviewed Status Code Progress towards goal/Likert rating  05/29/2023 05/28/2022          O 2/5 - learning to use her skills more consistently  05/25/2024 05/26/2023          O 3.5 - pt is learning  to use her skills and better manage her unrealistic and perfectionist tendencies that keep her stressed and anxious  05/31/2025 05/31/2024          O 4/5 - pt is able to head off the anxiety spirals that used to over take her,     Goal 3) Combine skills learned in therapy into a new daily approach to managing ADHD.  5 Point Likert rating baseline date: 05/28/2021  Target Date Goal Was reviewed Status Code Progress towards goal/Likert rating  05/29/2023 05/28/2022          O 2/5 - learning to use her skills more consistently  05/25/2024 05/26/2023          O 3.75 - accepted the need and began medication, has been more aware, recognize, acknowledge and accept the issues related ADHD and is better able to apply strategies. Pt is kinder to herself around her limitations or need for an alternative way of approaching tasks  05/31/2025 05/31/2024          O 3.25/5 - pt feels like she  has regressed some, returning to past patterns of avoidance and procrastination   This plan has been reviewed and created by the following participants:  This plan will be reviewed at least every 12 months. Date Behavioral Health Clinician Date Guardian/Patient   05/28/2022 Olivia [name], Ph.D.   05/28/2022 Olivia Olivia [name] [name]  05/26/2023 Olivia [name], Ph.D.  05/26/2023 Olivia Olivia [name] [place]  05/31/2024 Olivia [name], Ph.D. 05/31/2024 Olivia Olivia [name] [name]         Diagnosis: Major Depressive Disorder, recurrent, in partial remission Generalized Anxiety Disorder Attention Deficit Disorder, predominantly inattentive   Olivia [name] reports that she decided to just be happy, but realized that trying to be happy doesn't work.  I made some inquires and it seems she was starting to feel sadder the more time she had to spend helping her parents manage their affairs.  We d/e/p that deciding to just be kind to herself and others has been enough to make her feel happy.    Olivia Jenkins Sprung, PhD  Son: Spurgeon (17) he is on the  ASD Daughter:  Friend - Amy  Super Ego -

## 2024-08-30 ENCOUNTER — Ambulatory Visit: Admitting: Psychology

## 2024-08-30 DIAGNOSIS — F411 Generalized anxiety disorder: Secondary | ICD-10-CM | POA: Diagnosis not present

## 2024-08-30 DIAGNOSIS — F900 Attention-deficit hyperactivity disorder, predominantly inattentive type: Secondary | ICD-10-CM | POA: Diagnosis not present

## 2024-08-30 DIAGNOSIS — F324 Major depressive disorder, single episode, in partial remission: Secondary | ICD-10-CM

## 2024-08-30 NOTE — Progress Notes (Signed)
 PROGRESS NOTE:   Name: Olivia [name] Date: 08/30/2024 MRN: [id number] DOB: Mar 23, 1966 PCP: Jodie Lavern CROME, MD  Time spent: 4:00 PM - 4:58 PM  Annual Review: 05/31/2025  Today I met with  Olivia [name] in remote video (Caregility) face-to-face individual psychotherapy.  Distance Site: [name]: Dr Edison Remote Office Consent: Obtained verbal consent to transmit session remotely.   Patient is aware of the limitations inherent in participating in virtual therapy.   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 [name] is normally a high functioning and driven individual and it is a struggle to get motivated to do anything. If it is necessary for work [name] does it and does a good job, but it is not the same. [name] has gained 70 pounds in less than 9 months and it has impacted her self image. [name] is also peri menopausal and these hormonal changes are also likely to be contributing to her changing mood states.   Background History: Olivia [name] is contending with aging parents.  Her mother was diagnosed this year with vascular dementia, and her father had quadrupel by-pass surgery nearly two years ago, but continues to have other medical complications, and was most recently diagnosed with cancer. [name] is the medical point person for her family.  [name] is the main person who is supporting and providing guidance to her aging parents.  Olivia [name] has one sister, but her sister struggles to manage her own life and that of her daughter.   Olivia [name] was diagnosed with adult ADHD and responded well to medications.  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[employer])   Son: Olivia [name] (17) he is on the ASD, he is a Holiday Representative and attends [employer]   Mental Status Exam: Appearance:   Casual     Behavior:  Appropriate  Motor:   Normal  Speech/Language:   NA  Affect:  Appropriate  Mood:  normal  Thought process:  normal  Thought content:    WNL  Sensory/Perceptual disturbances:    WNL  Orientation:  oriented to person, place, time/date, situation, and day of week  Attention:  Good  Concentration:  Good  Memory:  WNL  Fund of knowledge:   Good  Insight:    Good  Judgment:   Good  Impulse Control:  Good        Individualized Treatment Plan                Strengths: bright, verbal, motivated, self aware, self advocate   Supports: spouse, family, friends, colleagues   Goal/Needs for Treatment:  In order of importance to patient  1) Develop healthy interpersonal relationships that lead to the alleviation and help prevent the relapse of depression.  2) Learn and implement coping skills that result in a reduction of anxiety and worry, and improved functioning.   3) Combine skills learned in therapy into a new daily approach to managing ADHD.   Client Statement of Needs: requests assistance with piecing back together her life as it once was before [name] got depressed (burnt out).   Treatment Level: Weekly Individual Outpatient Psychotherapy.  Symptoms: autonomic hyperactivity (e.g., palpitations, shortness of breath, dry mouth, trouble swallowing, nausea, diarrhea).  Childhood history of Attention Deficit Disorder (ADD) that was either diagnosed or later concluded due to the symptoms of behavioral  problems at school, impulsivity, temper outbursts, and lack of concentration.  Depressed or irritable mood. Diminished interest in or enjoyment of activities. Easily distracted and drawn from task at hand.  Excessive and/or unrealistic worry that is difficult to control occurring more days than not for at least 6 months about a number of events or activities.  Feelings of hopelessness, worthlessness, or inappropriate guilt.  Lack of energy.   Poor concentration and indecisiveness.  Restless and fidgety; unable  to be sedentary for more than a short time.   Client Treatment Preferences: Continue with current therapist   Healthcare consumer's goal for treatment:  Psychologist, Ronal Jenkins Sprung, Ph.D. will support the patient's ability to achieve the goals identified. Cognitive Behavioral Therapy, Dialectical Behavioral Therapy, Motivational Interviewing, SPACE, parent training, and other evidenced-based practices will be used to promote progress towards healthy functioning.   Healthcare consumer Olivia [name] will: Actively participate in therapy, working towards healthy functioning.    *Justification for Continuation/Discontinuation of Goal: R=Revised, O=Ongoing, A=Achieved, D=Discontinued  Goal 1) Develop healthy interpersonal relationships that lead to the alleviation and help prevent the relapse of depression.  5 Point Likert rating baseline date: 05/28/2021 Target Date Goal Was reviewed Status Code Progress towards goal/Likert rating  05/29/2023 05/28/2022         O 3/5 - learning to set better limits and boundaries, still hasn't been able to regulate her daily routines  05/25/2024 05/26/2023         O        3.75 - pt reestablished friendship but has not be consistent in reaching out, has improved her relationship with her parents with appropriate limits.  Is more accepting of her relationship with her estranged daughter as healthy for her and not a failure   05/31/2025 05/31/2024  4/5 - pt has established some new friendships, and is better at planning social events in order to spend time with these new friends   Goal 2) Learn and implement coping skills that result in a reduction of anxiety and worry, and improved daily functioning.  5 Point Likert rating baseline date: 05/28/2021  Target Date Goal Was reviewed Status Code Progress towards goal/Likert rating  05/29/2023 05/28/2022          O 2/5 - learning to use her skills more consistently  05/25/2024 05/26/2023          O 3.5 - pt is learning  to use her skills and better manage her unrealistic and perfectionist tendencies that keep her stressed and anxious  05/31/2025 05/31/2024          O 4/5 - pt is able to head off the anxiety spirals that used to over take her,     Goal 3) Combine skills learned in therapy into a new daily approach to managing ADHD.  5 Point Likert rating baseline date: 05/28/2021  Target Date Goal Was reviewed Status Code Progress towards goal/Likert rating  05/29/2023 05/28/2022          O 2/5 - learning to use her skills more consistently  05/25/2024 05/26/2023          O 3.75 - accepted the need and began medication, has been more aware, recognize, acknowledge and accept the issues related ADHD and is better able to apply strategies. Pt is kinder to herself around her limitations or need for an alternative way of approaching tasks  05/31/2025 05/31/2024          O 3.25/5 - pt feels like [name]  has regressed some, returning to past patterns of avoidance and procrastination   This plan has been reviewed and created by the following participants:  This plan will be reviewed at least every 12 months. Date Behavioral Olivia Clinician Date Guardian/Patient   05/28/2022 Ronal Jenkins Sprung, Ph.D.   05/28/2022 Olivia [name]  05/26/2023 Ronal Jenkins Sprung, Ph.D.  05/26/2023 Olivia [name]  05/31/2024 Ronal Jenkins Sprung, Ph.D. 05/31/2024 Olivia [name]         Diagnosis: Major Depressive Disorder, recurrent, in partial remission Generalized Anxiety Disorder Attention Deficit Disorder, predominantly inattentive   Olivia [name] had oral surgery last week and [name]'s still in a lot of pain.  We d/e/p what occurred and how [name] is caring for herself.  We also d/p her father's birthday, her anniversary, spending time with her daughter w/o tension, and anxiety around whether her mother can still manage to attend public events.  Lastly, we d/p the need to shift her mind set about certain people in order to allow  her anger go.  Ronal Jenkins Sprung, PhD  Son: Olivia [name] (17) he is on the ASD Daughter:  Olivia [name]  [employer] -

## 2024-08-31 ENCOUNTER — Other Ambulatory Visit (HOSPITAL_BASED_OUTPATIENT_CLINIC_OR_DEPARTMENT_OTHER): Payer: Self-pay

## 2024-08-31 ENCOUNTER — Encounter (HOSPITAL_BASED_OUTPATIENT_CLINIC_OR_DEPARTMENT_OTHER): Payer: Self-pay

## 2024-08-31 ENCOUNTER — Other Ambulatory Visit (HOSPITAL_COMMUNITY): Payer: Self-pay

## 2024-09-01 ENCOUNTER — Telehealth: Payer: Self-pay

## 2024-09-01 ENCOUNTER — Other Ambulatory Visit: Payer: Self-pay

## 2024-09-01 NOTE — Telephone Encounter (Signed)
 Pharmacy Patient Advocate Encounter   Received notification from Patient Pharmacy that prior authorization for Taltz  is required/requested.   Insurance verification completed.   The patient is insured through [facility].   Per test claim: PA required; PA submitted to above mentioned insurance via Latent Key/confirmation #/EOC [id number] Status is pending

## 2024-09-01 NOTE — Telephone Encounter (Signed)
 Pharmacy Patient Advocate Encounter  Received notification from [facility] that Prior Authorization for Taltz  has been APPROVED from 09/01/24 to 08/31/25   PA #/Case ID/Reference #: [id number]

## 2024-09-02 ENCOUNTER — Other Ambulatory Visit (HOSPITAL_COMMUNITY): Payer: Self-pay

## 2024-09-02 ENCOUNTER — Other Ambulatory Visit: Payer: Self-pay

## 2024-09-02 ENCOUNTER — Other Ambulatory Visit: Payer: Self-pay | Admitting: Pharmacy Technician

## 2024-09-02 NOTE — Progress Notes (Signed)
 Specialty Pharmacy Refill Coordination Note  Olivia [name] is a 58 y.o. female contacted today regarding refills of specialty medication(s) Ixekizumab  (Taltz )   Patient requested Delivery   Delivery date: 09/09/24   Verified address: [address]   [place]   Medication will be filled on: 09/08/24

## 2024-09-02 NOTE — Progress Notes (Signed)
 Clinical Intervention Note  Clinical Intervention Notes: Patient reported starting doxycycline , no DDIs were identified with her Taltz .   Clinical Intervention Outcomes: Prevention of an adverse drug event   Olivia [name] [name] Santa

## 2024-09-03 ENCOUNTER — Telehealth (INDEPENDENT_AMBULATORY_CARE_PROVIDER_SITE_OTHER): Admitting: Adult Health

## 2024-09-03 ENCOUNTER — Encounter: Payer: Self-pay | Admitting: Adult Health

## 2024-09-03 ENCOUNTER — Other Ambulatory Visit (HOSPITAL_COMMUNITY): Payer: Self-pay

## 2024-09-03 ENCOUNTER — Other Ambulatory Visit (HOSPITAL_BASED_OUTPATIENT_CLINIC_OR_DEPARTMENT_OTHER): Payer: Self-pay

## 2024-09-03 ENCOUNTER — Telehealth: Payer: Self-pay | Admitting: Adult Health

## 2024-09-03 DIAGNOSIS — G4733 Obstructive sleep apnea (adult) (pediatric): Secondary | ICD-10-CM | POA: Diagnosis not present

## 2024-09-03 DIAGNOSIS — R0602 Shortness of breath: Secondary | ICD-10-CM

## 2024-09-03 MED ORDER — FLUZONE 0.5 ML IM SUSY
0.5000 mL | PREFILLED_SYRINGE | Freq: Once | INTRAMUSCULAR | 0 refills | Status: AC
  Filled 2024-09-03: qty 0.5, 1d supply, fill #0

## 2024-09-03 NOTE — Progress Notes (Signed)
 Virtual Visit via Video Note  I connected with Olivia [name] on 09/03/24 at  1:30 PM EDT by a video enabled telemedicine application and verified that I am speaking with the correct person using two identifiers.  Location: Patient: Home  Provider: Office    I discussed the limitations of evaluation and management by telemedicine and the availability of in person appointments. The patient expressed understanding and agreed to proceed.  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  Patient was seen earlier this year for sleep consult with daytime fatigue, restless sleep and erythrocytosis.  She was set up for a home sleep study that was done on June 09, 2024 that showed severe sleep apnea with AHI 32/hour and SpO2 low at 79%.  She was started on CPAP therapy.  Patient says that she is doing so much better.  She is amazed at how life-changing CPAP has been for her.  She feels so much more rested since starting CPAP.  She is using her CPAP every single night.  She is using a fullface mask.  DME is [facility].  Her download shows 100% compliance at 97% usage daily average usage at 9 hours.  She is on auto CPAP 5 to 15 cm H2O.  AHI 2.0/hour daily average pressure at 8.3 cm H2O. Patient says she did have some issues with her insurance coverage for the sleep study and also for CPAP machine.  She is currently working with her insurance and DME company for charges.  Patient was also having some intermittent shortness of breath exacerbated by humid hot weather.  Previous chest x-ray in October 2024 was clear.  Previous pulmonary function testing in 2019 was normal.  She was set up for a 2D echo  on July 27, 2024 that showed normal EF at 55 to 60%, grade 1 diastolic dysfunction, normal right ventricular systolic function and RV size.  No valvular disease noted.  Mild MR, We discussed her echo results in detail.  Patient says overall she is doing well.   observations/Objective: Home sleep study June 09, 2024 AHI 32.0/hour and SpO2 low at 79%   Assessment and Plan: Severe obstructive sleep apnea with excellent control and compliance and perceived benefit on CPAP therapy.  Patient is to continue on her current settings.  CPAP care discussed.  Obesity with current weight at 302 pounds.  Patient is encouraged on healthy weight loss.  Dyspnea mild and intermittent.  Previous chest x-ray clear.  Pulmonary function testing in 2019 was normal.  Recent echo shows preserved EF.  Some mild grade 1 diastolic dysfunction.  If symptoms persist can consider repeat pulmonary function testing and chest x-ray imaging.  No evidence of pulmonary hypertension.  Plan  Patient Instructions  Continue on CPAP therapy, wear all night long for at least 6 or more hours Work on healthy weight loss  Do not drive if sleepy  Keep up great job.  Follow up in 6 months- Virtual or In person visit .     Follow Up Instructions:    I discussed the assessment and treatment plan with the patient. The patient was provided an opportunity to ask questions and all were answered. The patient agreed with the plan and demonstrated an  understanding of the instructions.   The patient was advised to call back or seek an in-person evaluation if the symptoms worsen or if the condition fails to improve as anticipated.  I provided 22  minutes of non-face-to-face time during this encounter.   Madelin Stank, NP

## 2024-09-03 NOTE — Patient Instructions (Addendum)
 Continue on CPAP therapy, wear all night long for at least 6 or more hours Work on healthy weight loss  Do not drive if sleepy  Keep up great job.  Follow up in 6 months and As needed  - Virtual or In person visit .

## 2024-09-03 NOTE — Telephone Encounter (Signed)
 Can we check on this- Patient says  [employer] is not in [employer]. She was charged $275 for HST .   Also [employer] charged her $800 for the CPAP even though insurance says she had met deductible should have been <$200.   Can we look into why these charges were like this.

## 2024-09-06 ENCOUNTER — Ambulatory Visit (INDEPENDENT_AMBULATORY_CARE_PROVIDER_SITE_OTHER): Admitting: Psychology

## 2024-09-06 ENCOUNTER — Ambulatory Visit: Admitting: Psychology

## 2024-09-06 DIAGNOSIS — F3341 Major depressive disorder, recurrent, in partial remission: Secondary | ICD-10-CM

## 2024-09-06 DIAGNOSIS — F900 Attention-deficit hyperactivity disorder, predominantly inattentive type: Secondary | ICD-10-CM | POA: Diagnosis not present

## 2024-09-06 DIAGNOSIS — F411 Generalized anxiety disorder: Secondary | ICD-10-CM | POA: Diagnosis not present

## 2024-09-06 NOTE — Progress Notes (Signed)
 PROGRESS NOTE:   Name: Olivia [name] Date: 09/06/2024 MRN: [id number] DOB: Jun 23, 1966 PCP: Jodie Lavern CROME, MD  Time spent: 3:00 PM - 3:58 PM  Annual Review: 05/31/2025  Today I met with  Olivia [name] in remote video (Caregility) face-to-face individual psychotherapy.  Distance Site: [name]: Dr Edison Remote Office Consent: Obtained verbal consent to transmit session remotely.   Patient is aware of the limitations inherent in participating in virtual therapy.   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 She is normally a high functioning and driven individual and it is a struggle to get motivated to do anything. If it is necessary for work she does it and does a good job, but it is not the same. She has gained 70 pounds in less than 9 months and it has impacted her self image. She is also peri menopausal and these hormonal changes are also likely to be contributing to her changing mood states.   Background History: Olivia [name] is contending with aging parents.  Her mother was diagnosed this year with vascular dementia, and her father had quadrupel by-pass surgery nearly two years ago, but continues to have other medical complications, and was most recently diagnosed with cancer. She is the medical point person for her family.  She is the main person who is supporting and providing guidance to her aging parents.  Olivia [name] has one sister, but her sister struggles to manage her own life and that of her daughter.   Olivia [name] states that she was diagnosed with adult ADHD and responded well to medications.  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)   Son: Olivia [name] (17) he is on the ASD, he is a Holiday Representative and attends [employer]   Mental Status Exam: Appearance:   Casual     Behavior:  Appropriate  Motor:   Normal  Speech/Language:   NA  Affect:  Appropriate  Mood:  normal  Thought process:  normal  Thought content:    WNL  Sensory/Perceptual disturbances:    WNL  Orientation:  oriented to person, place, time/date, situation, and day of week  Attention:  Good  Concentration:  Good  Memory:  WNL  Fund of knowledge:   Good  Insight:    Good  Judgment:   Good  Impulse Control:  Good        Individualized Treatment Plan                Strengths: bright, verbal, motivated, self aware, self advocate   Supports: spouse, family, friends, colleagues   Goal/Needs for Treatment:  In order of importance to patient  1) Develop healthy interpersonal relationships that lead to the alleviation and help prevent the relapse of depression.  2) Learn and implement coping skills that result in a reduction of anxiety and worry, and improved functioning.   3) Combine skills learned in therapy into a new daily approach to managing ADHD.   Client Statement of Needs: requests assistance with piecing back together her life as it once was before she got depressed (burnt out).   Treatment Level: Weekly Individual Outpatient Psychotherapy.  Symptoms: autonomic hyperactivity (e.g., palpitations, shortness of breath, dry mouth, trouble swallowing, nausea, diarrhea).  Childhood history of Attention Deficit Disorder (ADD) that was either diagnosed or later concluded due to the symptoms of behavioral  problems at school, impulsivity, temper outbursts, and lack of concentration.  Depressed or irritable mood. Diminished interest in or enjoyment of activities. Easily distracted and drawn from task at hand.  Excessive and/or unrealistic worry that is difficult to control occurring more days than not for at least 6 months about a number of events or activities.  Feelings of hopelessness, worthlessness, or inappropriate guilt.  Lack of energy.   Poor concentration and indecisiveness.  Restless and fidgety; unable  to be sedentary for more than a short time.   Client Treatment Preferences: Continue with current therapist   Healthcare consumer's goal for treatment:  Psychologist, Ronal Jenkins Sprung, Ph.D. will support the patient's ability to achieve the goals identified. Cognitive Behavioral Therapy, Dialectical Behavioral Therapy, Motivational Interviewing, SPACE, parent training, and other evidenced-based practices will be used to promote progress towards healthy functioning.   Healthcare consumer Olivia [name] will: Actively participate in therapy, working towards healthy functioning.    *Justification for Continuation/Discontinuation of Goal: R=Revised, O=Ongoing, A=Achieved, D=Discontinued  Goal 1) Develop healthy interpersonal relationships that lead to the alleviation and help prevent the relapse of depression.  5 Point Likert rating baseline date: 05/28/2021 Target Date Goal Was reviewed Status Code Progress towards goal/Likert rating  05/29/2023 05/28/2022         O 3/5 - learning to set better limits and boundaries, still hasn't been able to regulate her daily routines  05/25/2024 05/26/2023         O        3.75 - pt reestablished friendship but has not be consistent in reaching out, has improved her relationship with her parents with appropriate limits.  Is more accepting of her relationship with her estranged daughter as healthy for her and not a failure   05/31/2025 05/31/2024  4/5 - pt has established some new friendships, and is better at planning social events in order to spend time with these new friends   Goal 2) Learn and implement coping skills that result in a reduction of anxiety and worry, and improved daily functioning.  5 Point Likert rating baseline date: 05/28/2021  Target Date Goal Was reviewed Status Code Progress towards goal/Likert rating  05/29/2023 05/28/2022          O 2/5 - learning to use her skills more consistently  05/25/2024 05/26/2023          O 3.5 - pt is learning  to use her skills and better manage her unrealistic and perfectionist tendencies that keep her stressed and anxious  05/31/2025 05/31/2024          O 4/5 - pt is able to head off the anxiety spirals that used to over take her,     Goal 3) Combine skills learned in therapy into a new daily approach to managing ADHD.  5 Point Likert rating baseline date: 05/28/2021  Target Date Goal Was reviewed Status Code Progress towards goal/Likert rating  05/29/2023 05/28/2022          O 2/5 - learning to use her skills more consistently  05/25/2024 05/26/2023          O 3.75 - accepted the need and began medication, has been more aware, recognize, acknowledge and accept the issues related ADHD and is better able to apply strategies. Pt is kinder to herself around her limitations or need for an alternative way of approaching tasks  05/31/2025 05/31/2024          O 3.25/5 - pt feels like she  has regressed some, returning to past patterns of avoidance and procrastination   This plan has been reviewed and created by the following participants:  This plan will be reviewed at least every 12 months. Date Behavioral Olivia Clinician Date Guardian/Patient   05/28/2022 Ronal Jenkins Sprung, Ph.D.   05/28/2022 Olivia [name]  05/26/2023 Ronal Jenkins Sprung, Ph.D.  05/26/2023 Olivia [name]  05/31/2024 Ronal Jenkins Sprung, Ph.D. 05/31/2024 Olivia [name]         Diagnosis: Major Depressive Disorder, recurrent, in partial remission Generalized Anxiety Disorder Attention Deficit Disorder, predominantly inattentive   Olivia [name] that she got an emergency call from her father while she was in the middle of a state meeting.  We d/e/p what occurred, worrying about the stress her father is under, feeling the weight of being the one they call, and learning how to accept that things are not going to get better and that it's time to get help with her mother.  Olivia [name] states that she wants to start exercising, but doesn't know  how to do it without going all-or-nothing.  I helped her to see how she views exercising as a threat and goes into freeze mode.  We were then able to d/ and create a more reasonable plan - 10 mins/day paired with reassuring self talk.  We looked at the [employer] app together and directed her to choose something obtainable.  Ronal Jenkins Sprung, PhD  Son: Spurgeon (17) he is on the ASD Daughter:  Friend - Amy  Super Ego -

## 2024-09-06 NOTE — Telephone Encounter (Signed)
 I sent an email to [facility] with [employer] to get clarification CPAP billing. Received email from [employer] [employer] with [employer] stating that [employer] is out of network and the out of pocket max for them is $275.

## 2024-09-06 NOTE — Telephone Encounter (Signed)
 Delon from [facility] also confirmed that [employer] is out of network (PCCs have been educated on this now), [employer] explained the pricing to the patient and they have verbal records that the patient consented. Mitch from [employer] responded and states that there is no record that a bill for that amount went out to the patient. Do I need to call the patient? Unfortunately she will need to contact [employer] and [employer] for their billing.

## 2024-09-08 ENCOUNTER — Other Ambulatory Visit: Payer: Self-pay | Admitting: Gastroenterology

## 2024-09-08 ENCOUNTER — Other Ambulatory Visit: Payer: Self-pay

## 2024-09-10 ENCOUNTER — Other Ambulatory Visit (HOSPITAL_BASED_OUTPATIENT_CLINIC_OR_DEPARTMENT_OTHER): Payer: Self-pay

## 2024-09-13 ENCOUNTER — Other Ambulatory Visit: Payer: Self-pay | Admitting: Family Medicine

## 2024-09-13 ENCOUNTER — Other Ambulatory Visit (HOSPITAL_BASED_OUTPATIENT_CLINIC_OR_DEPARTMENT_OTHER): Payer: Self-pay

## 2024-09-13 MED ORDER — ESCITALOPRAM OXALATE 20 MG PO TABS
20.0000 mg | ORAL_TABLET | Freq: Every day | ORAL | 3 refills | Status: AC
  Filled 2024-09-13: qty 90, 90d supply, fill #0
  Filled 2024-12-10: qty 90, 90d supply, fill #1

## 2024-09-17 ENCOUNTER — Other Ambulatory Visit: Payer: Self-pay

## 2024-09-17 ENCOUNTER — Ambulatory Visit: Payer: Self-pay | Admitting: Family Medicine

## 2024-09-17 ENCOUNTER — Other Ambulatory Visit (INDEPENDENT_AMBULATORY_CARE_PROVIDER_SITE_OTHER)

## 2024-09-17 ENCOUNTER — Ambulatory Visit (INDEPENDENT_AMBULATORY_CARE_PROVIDER_SITE_OTHER)

## 2024-09-17 DIAGNOSIS — Z9089 Acquired absence of other organs: Secondary | ICD-10-CM | POA: Diagnosis not present

## 2024-09-17 DIAGNOSIS — Z9889 Other specified postprocedural states: Secondary | ICD-10-CM

## 2024-09-17 DIAGNOSIS — E0590 Thyrotoxicosis, unspecified without thyrotoxic crisis or storm: Secondary | ICD-10-CM

## 2024-09-17 LAB — T3, FREE: T3, Free: 3.7 pg/mL (ref 2.3–4.2)

## 2024-09-17 LAB — T4, FREE: Free T4: 0.63 ng/dL (ref 0.60–1.60)

## 2024-09-17 LAB — TSH: TSH: <0.01 u[IU]/mL — ABNORMAL LOW (ref 0.35–5.50)

## 2024-09-17 NOTE — Progress Notes (Signed)
 Please have lab add on a Free T3 and Free T4

## 2024-09-17 NOTE — Progress Notes (Signed)
Labs has been added

## 2024-09-22 ENCOUNTER — Other Ambulatory Visit (HOSPITAL_COMMUNITY): Payer: Self-pay

## 2024-09-22 ENCOUNTER — Ambulatory Visit: Payer: Self-pay | Admitting: Family Medicine

## 2024-09-22 DIAGNOSIS — E890 Postprocedural hypothyroidism: Secondary | ICD-10-CM

## 2024-09-22 MED ORDER — LEVOTHYROXINE SODIUM 88 MCG PO TABS
88.0000 ug | ORAL_TABLET | Freq: Every day | ORAL | 3 refills | Status: AC
  Filled 2024-09-22 – 2024-09-27 (×2): qty 90, 90d supply, fill #0

## 2024-09-22 MED ORDER — LIOTHYRONINE SODIUM 5 MCG PO TABS
5.0000 ug | ORAL_TABLET | Freq: Every day | ORAL | 3 refills | Status: AC
  Filled 2024-09-22 – 2024-09-27 (×2): qty 90, 90d supply, fill #0

## 2024-09-22 NOTE — Progress Notes (Signed)
 See mychart note

## 2024-09-27 ENCOUNTER — Other Ambulatory Visit (HOSPITAL_COMMUNITY): Payer: Self-pay

## 2024-09-27 ENCOUNTER — Ambulatory Visit (INDEPENDENT_AMBULATORY_CARE_PROVIDER_SITE_OTHER): Admitting: Psychology

## 2024-09-27 ENCOUNTER — Other Ambulatory Visit (HOSPITAL_BASED_OUTPATIENT_CLINIC_OR_DEPARTMENT_OTHER): Payer: Self-pay

## 2024-09-27 DIAGNOSIS — F4310 Post-traumatic stress disorder, unspecified: Secondary | ICD-10-CM | POA: Diagnosis not present

## 2024-09-27 MED ORDER — GOLYTELY 236 G PO SOLR
ORAL | 0 refills | Status: AC
  Filled 2024-09-27: qty 4000, 1d supply, fill #0

## 2024-09-27 NOTE — Progress Notes (Signed)
 PROGRESS NOTE:   Name: Olivia [name] Date: 09/27/2024 MRN: [id number] DOB: December 11, 1965 PCP: Jodie Lavern CROME, MD  Time spent: 3:00 PM - 3:58 PM  Annual Review: 05/31/2025  Today I met with  Olivia [name] in remote video (Caregility) face-to-face individual psychotherapy.  Distance Site: [name]: Dr Edison Remote Office Consent: Obtained verbal consent to transmit session remotely.   Patient is aware of the limitations inherent in participating in virtual therapy.   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 She is normally a high functioning and driven individual and it is a struggle to get motivated to do anything. If it is necessary for work she does it and does a good job, but it is not the same. She has gained 70 pounds in less than 9 months and it has impacted her self image. She is also peri menopausal and these hormonal changes are also likely to be contributing to her changing mood states.   Background History: Olivia [name] is contending with aging parents.  Her mother was diagnosed this year with vascular dementia, and her father had quadrupel by-pass surgery nearly two years ago, but continues to have other medical complications, and was most recently diagnosed with cancer. She is the medical point person for her family.  She is the main person who is supporting and providing guidance to her aging parents.  Olivia [name] has one sister, but her sister struggles to manage her own life and that of her daughter.   Olivia [name] states that she was diagnosed with adult ADHD and responded well to medications.  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)   Son: Health Visitor (17) he is on the ASD, he is a Holiday Representative and attends [employer]   Mental Status Exam: Appearance:   Casual     Behavior:  Appropriate  Motor:   Normal  Speech/Language:   NA  Affect:  Appropriate  Mood:  normal  Thought process:  normal  Thought content:    WNL  Sensory/Perceptual disturbances:    WNL  Orientation:  oriented to person, place, time/date, situation, and day of week  Attention:  Good  Concentration:  Good  Memory:  WNL  Fund of knowledge:   Good  Insight:    Good  Judgment:   Good  Impulse Control:  Good        Individualized Treatment Plan                Strengths: bright, verbal, motivated, self aware, self advocate   Supports: spouse, family, friends, colleagues   Goal/Needs for Treatment:  In order of importance to patient  1) Develop healthy interpersonal relationships that lead to the alleviation and help prevent the relapse of depression.  2) Learn and implement coping skills that result in a reduction of anxiety and worry, and improved functioning.   3) Combine skills learned in therapy into a new daily approach to managing ADHD.   Client Statement of Needs: requests assistance with piecing back together her life as it once was before she got depressed (burnt out).   Treatment Level: Weekly Individual Outpatient Psychotherapy.  Symptoms: autonomic hyperactivity (e.g., palpitations, shortness of breath, dry mouth, trouble swallowing, nausea, diarrhea).  Childhood history of Attention Deficit Disorder (ADD) that was either diagnosed or later concluded due to the symptoms of behavioral  problems at school, impulsivity, temper outbursts, and lack of concentration.  Depressed or irritable mood. Diminished interest in or enjoyment of activities. Easily distracted and drawn from task at hand.  Excessive and/or unrealistic worry that is difficult to control occurring more days than not for at least 6 months about a number of events or activities.  Feelings of hopelessness, worthlessness, or inappropriate guilt.  Lack of energy.   Poor concentration and indecisiveness.  Restless and fidgety; unable  to be sedentary for more than a short time.   Client Treatment Preferences: Continue with current therapist   Healthcare consumer's goal for treatment:  Psychologist, Ronal Jenkins Sprung, Ph.D. will support the patient's ability to achieve the goals identified. Cognitive Behavioral Therapy, Dialectical Behavioral Therapy, Motivational Interviewing, SPACE, parent training, and other evidenced-based practices will be used to promote progress towards healthy functioning.   Healthcare consumer Olivia [name] will: Actively participate in therapy, working towards healthy functioning.    *Justification for Continuation/Discontinuation of Goal: R=Revised, O=Ongoing, A=Achieved, D=Discontinued  Goal 1) Develop healthy interpersonal relationships that lead to the alleviation and help prevent the relapse of depression.  5 Point Likert rating baseline date: 05/28/2021 Target Date Goal Was reviewed Status Code Progress towards goal/Likert rating  05/29/2023 05/28/2022          O 3/5 - learning to set better limits and boundaries, still hasn't been able to regulate her daily routines  05/25/2024 05/26/2023          O        3.75 - pt reestablished friendship but has not be consistent in reaching out, has improved her relationship with her parents with appropriate limits.  Is more accepting of her relationship with her estranged daughter as healthy for her and not a failure   05/31/2025 05/31/2024  4/5 - pt has established some new friendships, and is better at planning social events in order to spend time with these new friends   Goal 2) Learn and implement coping skills that result in a reduction of anxiety and worry, and improved daily functioning.  5 Point Likert rating baseline date: 05/28/2021  Target Date Goal Was reviewed Status Code Progress towards goal/Likert rating  05/29/2023 05/28/2022          O 2/5 - learning to use her skills more consistently  05/25/2024 05/26/2023          O 3.5 - pt is  learning to use her skills and better manage her unrealistic and perfectionist tendencies that keep her stressed and anxious  05/31/2025 05/31/2024          O 4/5 - pt is able to head off the anxiety spirals that used to over take her,     Goal 3) Combine skills learned in therapy into a new daily approach to managing ADHD.  5 Point Likert rating baseline date: 05/28/2021  Target Date Goal Was reviewed Status Code Progress towards goal/Likert rating  05/29/2023 05/28/2022          O 2/5 - learning to use her skills more consistently  05/25/2024 05/26/2023          O 3.75 - accepted the need and began medication, has been more aware, recognize, acknowledge and accept the issues related ADHD and is better able to apply strategies. Pt is kinder to herself around her limitations or need for an alternative way of approaching tasks  05/31/2025 05/31/2024          O 3.25/5 - pt feels  like she has regressed some, returning to past patterns of avoidance and procrastination   This plan has been reviewed and created by the following participants:  This plan will be reviewed at least every 12 months. Date Behavioral Health Clinician Date Guardian/Patient   05/28/2022 Ronal Jenkins Sprung, Ph.D.   05/28/2022 Olivia Olivia [name] [name]  05/26/2023 Ronal Jenkins Sprung, Ph.D.  05/26/2023 Olivia Olivia [name] Noy  05/31/2024 Ronal Jenkins Sprung, Ph.D. 05/31/2024 Olivia Olivia [name] [name]         Diagnosis: Major Depressive Disorder, recurrent, in partial remission Generalized Anxiety Disorder Attention Deficit Disorder, predominantly inattentive   Olivia [name] reports that she did well while I was away, but all hell broke lose.  We d/e/p getting her husband through his colonoscopy, her father having a cardiac emergency, and dealing with her mother's dementia while all this is happening.  We also d/e/p that she started to have a panic attack after taking a work call.  We d/e/p what occurred, how she felt, how she responded, and that the skills  she used helped her to de-escalate her reaction.  Olivia [name] did a good job of following up with calming exercises the rest of the day and she continue to decrease her anxiety.  Ronal Jenkins Sprung, PhD  Son: Spurgeon (17) he is on the ASD Daughter:  Friend - Amy  Super Ego - Dickey

## 2024-09-28 ENCOUNTER — Other Ambulatory Visit: Payer: Self-pay

## 2024-09-29 NOTE — Progress Notes (Signed)
 Anesthesia Review:  PCP: Lavern Heck  Cardiologist : Pulm- Tammy Parrett,NP LOV 09/03/24   PPM/ ICD: Device Orders: Rep Notified:  Chest x-ray : EKG :09/05/2023  Echo : 07/27/24  Stress test: Cardiac Cath :   Activity level:  Sleep Study/ CPAP : Fasting Blood Sugar :      / Checks Blood Sugar -- times a day:    Blood Thinner/ Instructions /Last Dose: ASA / Instructions/ Last Dose :    Hx of PTSD

## 2024-10-01 ENCOUNTER — Other Ambulatory Visit: Payer: Self-pay

## 2024-10-04 ENCOUNTER — Encounter (HOSPITAL_COMMUNITY): Payer: Self-pay | Admitting: Gastroenterology

## 2024-10-04 ENCOUNTER — Ambulatory Visit: Admitting: Psychology

## 2024-10-04 DIAGNOSIS — F331 Major depressive disorder, recurrent, moderate: Secondary | ICD-10-CM | POA: Diagnosis not present

## 2024-10-04 DIAGNOSIS — F900 Attention-deficit hyperactivity disorder, predominantly inattentive type: Secondary | ICD-10-CM

## 2024-10-04 DIAGNOSIS — F411 Generalized anxiety disorder: Secondary | ICD-10-CM | POA: Diagnosis not present

## 2024-10-04 NOTE — Progress Notes (Signed)
 PROGRESS NOTE:   Name: Olivia [name] Date: 10/04/2024 MRN: [id number] DOB: [birth year] PCP: Jodie Lavern CROME, MD  Time spent: 4:00 PM - 4:58 PM  Annual Review: 05/31/2025  Today I met with  Olivia [name] in remote video (Caregility) face-to-face individual psychotherapy.  Distance Site: [name]: Dr Edison Remote Office Consent: Obtained verbal consent to transmit session remotely.   Patient is aware of the limitations inherent in participating in virtual therapy.   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 She is normally a high functioning and driven individual and it is a struggle to get motivated to do anything. If it is necessary for work she does it and does a good job, but it is not the same. She has gained 70 pounds in less than 9 months and it has impacted her self image. She is also peri menopausal and these hormonal changes are also likely to be contributing to her changing mood states.   Background History: Olivia [name] is contending with aging parents.  Her mother was diagnosed this year with vascular dementia, and her father had quadrupel by-pass surgery nearly two years ago, but continues to have other medical complications, and was most recently diagnosed with cancer. She is the medical point person for her family.  She is the main person who is supporting and providing guidance to her aging parents.  Olivia [name] has one sister, but her sister struggles to manage her own life and that of her daughter.   Mayukha states that she was diagnosed with adult ADHD and responded well to medications.  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[employer])   Son: Health Visitor (17) he is on the ASD, he is a Holiday Representative and attends [employer]   Mental Status Exam: Appearance:   Casual     Behavior:  Appropriate  Motor:   Normal  Speech/Language:   NA  Affect:  Appropriate  Mood:  normal  Thought process:  normal  Thought content:    WNL  Sensory/Perceptual disturbances:    WNL  Orientation:  oriented to person, place, time/date, situation, and day of week  Attention:  Good  Concentration:  Good  Memory:  WNL  Fund of knowledge:   Good  Insight:    Good  Judgment:   Good  Impulse Control:  Good        Individualized Treatment Plan                Strengths: bright, verbal, motivated, self aware, self advocate   Supports: spouse, family, friends, colleagues   Goal/Needs for Treatment:  In order of importance to patient  1) Develop healthy interpersonal relationships that lead to the alleviation and help prevent the relapse of depression.  2) Learn and implement coping skills that result in a reduction of anxiety and worry, and improved functioning.   3) Combine skills learned in therapy into a new daily approach to managing ADHD.   Client Statement of Needs: requests assistance with piecing back together her life as it once was before she got depressed (burnt out).   Treatment Level: Weekly Individual Outpatient Psychotherapy.  Symptoms: autonomic hyperactivity (e.g., palpitations, shortness of breath, dry mouth, trouble swallowing, nausea, diarrhea).  Childhood history of Attention Deficit Disorder (ADD) that was either diagnosed or later concluded due to the symptoms of behavioral  problems at school, impulsivity, temper outbursts, and lack of concentration.  Depressed or irritable mood. Diminished interest in or enjoyment of activities. Easily distracted and drawn from task at hand.  Excessive and/or unrealistic worry that is difficult to control occurring more days than not for at least 6 months about a number of events or activities.  Feelings of hopelessness, worthlessness, or inappropriate guilt.  Lack of energy.   Poor concentration and indecisiveness.  Restless and fidgety; unable  to be sedentary for more than a short time.   Client Treatment Preferences: Continue with current therapist   Healthcare consumer's goal for treatment:  Psychologist, Ronal Jenkins Sprung, Ph.D. will support the patient's ability to achieve the goals identified. Cognitive Behavioral Therapy, Dialectical Behavioral Therapy, Motivational Interviewing, SPACE, parent training, and other evidenced-based practices will be used to promote progress towards healthy functioning.   Healthcare consumer Olivia [name] will: Actively participate in therapy, working towards healthy functioning.    *Justification for Continuation/Discontinuation of Goal: R=Revised, O=Ongoing, A=Achieved, D=Discontinued  Goal 1) Develop healthy interpersonal relationships that lead to the alleviation and help prevent the relapse of depression.  5 Point Likert rating baseline date: 05/28/2021 Target Date Goal Was reviewed Status Code Progress towards goal/Likert rating  05/29/2023 05/28/2022          O 3/5 - learning to set better limits and boundaries, still hasn't been able to regulate her daily routines  05/25/2024 05/26/2023          O        3.75 - pt reestablished friendship but has not be consistent in reaching out, has improved her relationship with her parents with appropriate limits.  Is more accepting of her relationship with her estranged daughter as healthy for her and not a failure   05/31/2025 05/31/2024  4/5 - pt has established some new friendships, and is better at planning social events in order to spend time with these new friends   Goal 2) Learn and implement coping skills that result in a reduction of anxiety and worry, and improved daily functioning.  5 Point Likert rating baseline date: 05/28/2021  Target Date Goal Was reviewed Status Code Progress towards goal/Likert rating  05/29/2023 05/28/2022          O 2/5 - learning to use her skills more consistently  05/25/2024 05/26/2023          O 3.5 - pt is  learning to use her skills and better manage her unrealistic and perfectionist tendencies that keep her stressed and anxious  05/31/2025 05/31/2024          O 4/5 - pt is able to head off the anxiety spirals that used to over take her,     Goal 3) Combine skills learned in therapy into a new daily approach to managing ADHD.  5 Point Likert rating baseline date: 05/28/2021  Target Date Goal Was reviewed Status Code Progress towards goal/Likert rating  05/29/2023 05/28/2022          O 2/5 - learning to use her skills more consistently  05/25/2024 05/26/2023          O 3.75 - accepted the need and began medication, has been more aware, recognize, acknowledge and accept the issues related ADHD and is better able to apply strategies. Pt is kinder to herself around her limitations or need for an alternative way of approaching tasks  05/31/2025 05/31/2024          O 3.25/5 - pt feels  like she has regressed some, returning to past patterns of avoidance and procrastination   This plan has been reviewed and created by the following participants:  This plan will be reviewed at least every 12 months. Date Behavioral Health Clinician Date Guardian/Patient   05/28/2022 Ronal Jenkins Sprung, Ph.D.   05/28/2022 Olivia [name]  05/26/2023 Ronal Jenkins Sprung, Ph.D.  05/26/2023 Olivia [name]  05/31/2024 Ronal Jenkins Sprung, Ph.D. 05/31/2024 Olivia [name]         Diagnosis: Major Depressive Disorder, recurrent, in partial remission Generalized Anxiety Disorder Attention Deficit Disorder, predominantly inattentive   Olivia [name] reports that she had a very interesting Thanksgiving with her family which stretched out during the entire week.  We d/e/p what occurred, the different family dynamics that were in play, big but pleasant surprises, and learning to be flexible.  We also d/p that Olivia [name] is really ready to start planning her early retirement, wanting to begin to plan for her replacement and a smooth transition  in the next couple of years.  Ronal Jenkins Sprung, PhD  Son: Spurgeon (17) he is on the ASD Daughter:  Friend - Amy  Super Ego - Dickey

## 2024-10-04 NOTE — Progress Notes (Signed)
 Attempted to obtain medical history for pre op call via telephone, unable to reach at this time. HIPAA compliant voicemail message left requesting return call to pre surgical testing department.

## 2024-10-05 ENCOUNTER — Other Ambulatory Visit: Payer: Self-pay

## 2024-10-05 NOTE — Progress Notes (Signed)
 Specialty Pharmacy Refill Coordination Note  Olivia [name] is a 58 y.o. female contacted today regarding refills of specialty medication(s) Ixekizumab  (Taltz )   Patient requested Delivery   Delivery date: 10/13/24   Verified address: [address]  [place]   Medication will be filled on: 10/12/24

## 2024-10-08 ENCOUNTER — Ambulatory Visit (HOSPITAL_COMMUNITY)
Admission: RE | Admit: 2024-10-08 | Discharge: 2024-10-08 | Disposition: A | Attending: Gastroenterology | Admitting: Gastroenterology

## 2024-10-08 ENCOUNTER — Ambulatory Visit (HOSPITAL_COMMUNITY): Payer: Self-pay

## 2024-10-08 ENCOUNTER — Encounter (HOSPITAL_COMMUNITY): Admission: RE | Disposition: A | Payer: Self-pay | Source: Home / Self Care | Attending: Gastroenterology

## 2024-10-08 ENCOUNTER — Other Ambulatory Visit: Payer: Self-pay

## 2024-10-08 ENCOUNTER — Encounter (HOSPITAL_COMMUNITY): Payer: Self-pay

## 2024-10-08 DIAGNOSIS — Z139 Encounter for screening, unspecified: Secondary | ICD-10-CM | POA: Diagnosis not present

## 2024-10-08 DIAGNOSIS — E669 Obesity, unspecified: Secondary | ICD-10-CM | POA: Diagnosis not present

## 2024-10-08 DIAGNOSIS — Z1211 Encounter for screening for malignant neoplasm of colon: Secondary | ICD-10-CM | POA: Diagnosis not present

## 2024-10-08 DIAGNOSIS — Z6841 Body Mass Index (BMI) 40.0 and over, adult: Secondary | ICD-10-CM | POA: Diagnosis not present

## 2024-10-08 DIAGNOSIS — D124 Benign neoplasm of descending colon: Secondary | ICD-10-CM | POA: Diagnosis not present

## 2024-10-08 DIAGNOSIS — F418 Other specified anxiety disorders: Secondary | ICD-10-CM | POA: Diagnosis not present

## 2024-10-08 DIAGNOSIS — I2510 Atherosclerotic heart disease of native coronary artery without angina pectoris: Secondary | ICD-10-CM | POA: Diagnosis not present

## 2024-10-08 DIAGNOSIS — G4730 Sleep apnea, unspecified: Secondary | ICD-10-CM | POA: Diagnosis not present

## 2024-10-08 DIAGNOSIS — K635 Polyp of colon: Secondary | ICD-10-CM | POA: Diagnosis not present

## 2024-10-08 DIAGNOSIS — E785 Hyperlipidemia, unspecified: Secondary | ICD-10-CM | POA: Diagnosis not present

## 2024-10-08 DIAGNOSIS — J45909 Unspecified asthma, uncomplicated: Secondary | ICD-10-CM | POA: Diagnosis not present

## 2024-10-08 DIAGNOSIS — Z800 Family history of malignant neoplasm of digestive organs: Secondary | ICD-10-CM | POA: Diagnosis not present

## 2024-10-08 HISTORY — PX: POLYPECTOMY: SHX149

## 2024-10-08 HISTORY — PX: COLONOSCOPY: SHX5424

## 2024-10-08 SURGERY — COLONOSCOPY
Anesthesia: Monitor Anesthesia Care

## 2024-10-08 MED ORDER — SODIUM CHLORIDE 0.9 % IV SOLN: INTRAVENOUS | Status: DC

## 2024-10-08 MED ORDER — PROPOFOL 10 MG/ML IV BOLUS
INTRAVENOUS | Status: DC | PRN
  Administered 2024-10-08: 150 mg via INTRAVENOUS
  Administered 2024-10-08: 50 mg via INTRAVENOUS
  Administered 2024-10-08: 100 ug/kg/min via INTRAVENOUS

## 2024-10-08 MED ORDER — ONDANSETRON HCL 4 MG/2ML IJ SOLN
INTRAMUSCULAR | Status: DC | PRN
  Administered 2024-10-08: 4 mg via INTRAVENOUS

## 2024-10-08 MED ORDER — MIDAZOLAM HCL 5 MG/5ML IJ SOLN
INTRAMUSCULAR | Status: DC | PRN
  Administered 2024-10-08: 2 mg via INTRAVENOUS

## 2024-10-08 MED ORDER — LIDOCAINE HCL (CARDIAC) PF 100 MG/5ML IV SOSY: PREFILLED_SYRINGE | INTRAVENOUS | Status: DC | PRN

## 2024-10-08 MED ORDER — SUCCINYLCHOLINE CHLORIDE 200 MG/10ML IV SOSY
PREFILLED_SYRINGE | INTRAVENOUS | Status: DC | PRN
  Administered 2024-10-08: 120 mg via INTRAVENOUS

## 2024-10-08 MED ORDER — FENTANYL CITRATE (PF) 100 MCG/2ML IJ SOLN
INTRAMUSCULAR | Status: AC
  Filled 2024-10-08: qty 2

## 2024-10-08 MED ORDER — ALBUTEROL SULFATE HFA 108 (90 BASE) MCG/ACT IN AERS
INHALATION_SPRAY | RESPIRATORY_TRACT | Status: DC | PRN
  Administered 2024-10-08: 4 via RESPIRATORY_TRACT

## 2024-10-08 MED ORDER — MIDAZOLAM HCL 2 MG/2ML IJ SOLN
INTRAMUSCULAR | Status: AC
  Filled 2024-10-08: qty 2

## 2024-10-08 MED ORDER — PROPOFOL 1000 MG/100ML IV EMUL
INTRAVENOUS | Status: AC
  Filled 2024-10-08: qty 100

## 2024-10-08 MED ORDER — DEXAMETHASONE SOD PHOSPHATE PF 10 MG/ML IJ SOLN
INTRAMUSCULAR | Status: DC | PRN
  Administered 2024-10-08: 8 mg via INTRAVENOUS

## 2024-10-08 MED ORDER — LIDOCAINE 2% (20 MG/ML) 5 ML SYRINGE
INTRAMUSCULAR | Status: DC | PRN
  Administered 2024-10-08: 100 mg via INTRAVENOUS

## 2024-10-08 MED ORDER — FENTANYL CITRATE (PF) 100 MCG/2ML IJ SOLN
INTRAMUSCULAR | Status: DC | PRN
  Administered 2024-10-08 (×2): 50 ug via INTRAVENOUS

## 2024-10-08 MED ORDER — PROPOFOL 10 MG/ML IV BOLUS
INTRAVENOUS | Status: AC
  Filled 2024-10-08: qty 20

## 2024-10-08 NOTE — Discharge Instructions (Signed)

## 2024-10-08 NOTE — Transfer of Care (Signed)
 Immediate Anesthesia Transfer of Care Note  Patient: Olivia [name]  Procedure(s) Performed: COLONOSCOPY POLYPECTOMY, INTESTINE  Patient Location: PACU  Anesthesia TypeGeneral  Level of Consciousness: awake and alert   Airway & Oxygen Therapy: Patient Spontanous Breathing and Patient connected to face mask oxygen  Post-op Assessment: Report given to RN and Post -op Vital signs reviewed and stable  Post vital signs: Reviewed and stable  Last Vitals:  Vitals Value Taken Time  BP 125/68 10/08/24 10:40  Temp 36.4 C 10/08/24 10:33  Pulse 70 10/08/24 10:42  Resp 14 10/08/24 10:42  SpO2 97 % 10/08/24 10:42  Vitals shown include unfiled device data.  Last Pain:  Vitals:   10/08/24 1033  TempSrc: Temporal  PainSc: 0-No pain      Patients Stated Pain Goal: 0 (10/08/24 1033)  Complications: No notable events documented.

## 2024-10-08 NOTE — Anesthesia Procedure Notes (Signed)
 Procedure Name: Intubation Date/Time: 10/08/2024 9:55 AM  Performed by: Belvie Valri NOVAK, CRNAPre-anesthesia Checklist: Patient identified, Emergency Drugs available, Suction available and Patient being monitored Patient Re-evaluated:Patient Re-evaluated prior to induction Oxygen Delivery Method: Circle System Utilized Preoxygenation: Pre-oxygenation with 100% oxygen Induction Type: IV induction and Rapid sequence Laryngoscope Size: Mac and 3 Grade View: Grade II Tube type: Oral Tube size: 7.0 mm Number of attempts: 1 Airway Equipment and Method: Stylet and Oral airway Placement Confirmation: ETT inserted through vocal cords under direct vision, positive ETCO2 and breath sounds checked- equal and bilateral Secured at: 22 cm Tube secured with: Tape Dental Injury: Teeth and Oropharynx as per pre-operative assessment

## 2024-10-08 NOTE — Op Note (Addendum)
 [facility] Patient Name: Olivia [name] Procedure Date: 10/08/2024 MRN: [id number] Attending MD: Belvie Just , MD, [id number] Date of Birth: [birth year] CSN: [id number] Age: 58 Admit Type: Outpatient Procedure:                Colonoscopy Indications:              Screening for colorectal malignant neoplasm Providers:                Belvie Just, MD, Ozell Pouch, Haskel Chris, Technician Referring MD:              Medicines:                Propofol  per Anesthesia Complications:            No immediate complications. Estimated Blood Loss:     Estimated blood loss: none. Procedure:                Pre-Anesthesia Assessment:                           - Prior to the procedure, a History and Physical                            was performed, and patient medications and                            allergies were reviewed. The patient's tolerance of                            previous anesthesia was also reviewed. The risks                            and benefits of the procedure and the sedation                            options and risks were discussed with the patient.                            All questions were answered, and informed consent                            was obtained. Prior Anticoagulants: The patient has                            taken no anticoagulant or antiplatelet agents. ASA                            Grade Assessment: III - A patient with severe                            systemic disease. After reviewing the risks and  benefits, the patient was deemed in satisfactory                            condition to undergo the procedure.                           - Sedation was administered by an anesthesia                            professional. Deep sedation was attained.                           After obtaining informed consent, the colonoscope                            was passed under  direct vision. Throughout the                            procedure, the patient's blood pressure, pulse, and                            oxygen saturations were monitored continuously. The                            CF-HQ190L ([id number] Olympus colonoscope was                            introduced through the anus and advanced to the the                            cecum, identified by appendiceal orifice and                            ileocecal valve. The colonoscopy was performed                            without difficulty. The patient tolerated the                            procedure well. The quality of the bowel                            preparation was evaluated using the BBPS [facility]                            Bowel Preparation Scale) with scores of: Right                            Colon = 3, Transverse Colon = 3 and Left Colon = 3                            (entire mucosa seen well with no residual staining,  small fragments of stool or opaque liquid). The                            total BBPS score equals 9. The ileocecal valve,                            appendiceal orifice, and rectum were photographed. Scope In: 10:01:29 AM Scope Out: 10:18:20 AM Scope Withdrawal Time: 0 hours 14 minutes 22 seconds  Total Procedure Duration: 0 hours 16 minutes 51 seconds  Findings:      A 2 mm polyp was found in the descending colon. The polyp was sessile.       The polyp was removed with a cold snare. Resection and retrieval were       complete. Impression:               - One 2 mm polyp in the descending colon, removed                            with a cold snare. Resected and retrieved. Moderate Sedation:      Not Applicable - Patient had care per Anesthesia. Recommendation:           - Patient has a contact number available for                            emergencies. The signs and symptoms of potential                            delayed complications were  discussed with the                            patient. Return to normal activities tomorrow.                            Written discharge instructions were provided to the                            patient.                           - Resume previous diet.                           - Continue present medications.                           - Await pathology results.                           - Repeat colonoscopy in 5 years for surveillance.                            Her mother had colon cancer in her 8's. Procedure Code(s):        --- Professional ---  54614, Colonoscopy, flexible; with removal of                            tumor(s), polyp(s), or other lesion(s) by snare                            technique Diagnosis Code(s):        --- Professional ---                           Z12.11, Encounter for screening for malignant                            neoplasm of colon                           D12.4, Benign neoplasm of descending colon CPT copyright 2022 American Medical Association. All rights reserved. The codes documented in this report are preliminary and upon coder review may  be revised to meet current compliance requirements. Belvie Just, MD Belvie Just, MD 10/08/2024 10:26:37 AM This report has been signed electronically. Number of Addenda: 0

## 2024-10-08 NOTE — Anesthesia Postprocedure Evaluation (Signed)
 Anesthesia Post Note  Patient: Olivia [name]  Procedure(s) Performed: COLONOSCOPY POLYPECTOMY, INTESTINE     Patient location during evaluation: Endoscopy Anesthesia Type: General Level of consciousness: awake and alert Pain management: pain level controlled Vital Signs Assessment: post-procedure vital signs reviewed and stable Respiratory status: spontaneous breathing, nonlabored ventilation, respiratory function stable and patient connected to nasal cannula oxygen Cardiovascular status: blood pressure returned to baseline and stable Postop Assessment: no apparent nausea or vomiting Anesthetic complications: no   No notable events documented.  Last Vitals:  Vitals:   10/08/24 1040 10/08/24 1050  BP: 125/68 122/78  Pulse: 74 65  Resp: 16 (!) 22  Temp:    SpO2: 95% 95%    Last Pain:  Vitals:   10/08/24 1033  TempSrc: Temporal  PainSc: 0-No pain                 Rome Ade

## 2024-10-08 NOTE — Anesthesia Preprocedure Evaluation (Signed)
 Anesthesia Evaluation  Patient identified by MRN, date of birth, ID band Patient awake    Reviewed: Allergy & Precautions, NPO status , Patient's Chart, lab work & pertinent test results  History of Anesthesia Complications (+) PONV and history of anesthetic complications  Airway Mallampati: II  TM Distance: >3 FB Neck ROM: Full    Dental no notable dental hx. (+) Teeth Intact   Pulmonary asthma , sleep apnea and Continuous Positive Airway Pressure Ventilation , neg COPD, Patient abstained from smoking.Not current smoker   Pulmonary exam normal breath sounds clear to auscultation       Cardiovascular Exercise Tolerance: Good METS(-) hypertension+ CAD  (-) Past MI (-) dysrhythmias  Rhythm:Regular Rate:Normal - Systolic murmurs    Neuro/Psych  Headaches PSYCHIATRIC DISORDERS Anxiety Depression       GI/Hepatic hiatal hernia,neg GERD  ,,(+)     (-) substance abuse    Endo/Other  neg diabetesHypothyroidism  Class 3 obesityPatient on GLP1ra (retatrutide via trial). Last taken 4 days ago.  Renal/GU negative Renal ROS     Musculoskeletal   Abdominal  (+) + obese  Peds  Hematology   Anesthesia Other Findings Past Medical History: No date: Anxiety No date: Asthma No date: Environmental allergies No date: Hiatal hernia 08/19/2018: Hiatal hernia without gangrene or obstruction 01/17/2021: History of parathyroid  surgery 2021     Comment:  For primary hyperparathyroidism. Dr. Eletha 04/15/2008: HLD (hyperlipidemia) No date: Microcytic anemia No date: Migraine, hormonal 08/19/2018: Multiple thyroid  nodules     Comment:  2016 IMPRESSION: Multinodular goiter with multiple               bilateral thyroid  nodules. These are generally round and               hypoechoic. There is a mildly complex cyst in the right               lobe measuring up to 1.1 cm. The largest solid nodule is               in the inferior right lobe  measures 1.8 x 1.1 x 1.4 cm.               This could either be sampled or followed by ultrasound. No date: PAC (premature atrial contraction) No date: PACs (premature atrial contraction) No date: Palpitations 10/25/2014: PCOS (polycystic ovarian syndrome) No date: PONV (postoperative nausea and vomiting) No date: Pre-diabetes No date: Reflux No date: Seasonal allergies  Reproductive/Obstetrics                              Anesthesia Physical Anesthesia Plan  ASA: 3  Anesthesia Plan: General   Post-op Pain Management:    Induction: Intravenous and Rapid sequence  PONV Risk Score and Plan: 4 or greater and Ondansetron , Dexamethasone , Propofol  infusion and TIVA  Airway Management Planned: Oral ETT  Additional Equipment: None  Intra-op Plan:   Post-operative Plan: Extubation in OR  Informed Consent: I have reviewed the patients History and Physical, chart, labs and discussed the procedure including the risks, benefits and alternatives for the proposed anesthesia with the patient or authorized representative who has indicated his/her understanding and acceptance.     Dental advisory given  Plan Discussed with: CRNA and Surgeon  Anesthesia Plan Comments: (Patient currently takes a GLP-1 agonist medication. The latest guidelines from American Society of Anesthesiologists no longer make a blanket recommendation for cessation of weekly  injectable medication for one week prior to elective procedure, and of daily medication for one day prior to elective procedure, but rather suggest patient factors and clinical judgement should be taken into account. If these guidelines have not been adhered to, a risk/benefit discussion should be had with patient, and clinical judgement used. Patient last took retatrutide 4 days ago, has Class 3 obesity as well. I spoke with the patient and proceduralist about the r/b/a of proceeding with elective surgeries in these instances,  and both understand and accept the risks of proceeding with full stomach precautions. Patient prefers to proceed with RSI rather than reschedule.  Discussed risks of anesthesia with patient, including PONV, sore throat, lip/dental/eye damage. Rare risks discussed as well, such as cardiorespiratory and neurological sequelae, and allergic reactions. Discussed the role of CRNA in patient's perioperative care. Patient understands. Patient informed about increased incidence of above perioperative risk due to high BMI. Patient understands.  )        Anesthesia Quick Evaluation

## 2024-10-08 NOTE — H&P (Signed)
 Olivia [name] HPI: This 58 year old white female presents to the office for colon cancer screening. She has 1 BM per day with no obvious blood or mucus in the stool. She had occasional LLQ pain. The pain does not radiate. There are no exacerbating or alleviating factors. She has good appetite and has lost 50 pounds over the last year with diet and exercise. She denies having any complaints of nausea, vomiting, acid reflux, dysphagia or odynophagia. She denies having a family history of celiac sprue or IBD. Her mother has had colon cancer and adenomatous polyps removed and her maternal grandmother had colon cancer as well. She had a normal colonoscopy done on 09/29/2017. In 2013 a sessile serrated adenoma was removed.  Past Medical History:  Diagnosis Date   Anxiety    Asthma    Environmental allergies    Hiatal hernia    Hiatal hernia without gangrene or obstruction 08/19/2018   History of parathyroid  surgery 2021 01/17/2021   For primary hyperparathyroidism. Dr. Eletha   HLD (hyperlipidemia) 04/15/2008   Microcytic anemia    Migraine, hormonal    Multiple thyroid  nodules 08/19/2018   2016 IMPRESSION: Multinodular goiter with multiple bilateral thyroid  nodules. These are generally round and hypoechoic. There is a mildly complex cyst in the right lobe measuring up to 1.1 cm. The largest solid nodule is in the inferior right lobe measures 1.8 x 1.1 x 1.4 cm. This could either be sampled or followed by ultrasound.   PAC (premature atrial contraction)    PACs (premature atrial contraction)    Palpitations    PCOS (polycystic ovarian syndrome) 10/25/2014   PONV (postoperative nausea and vomiting)    Pre-diabetes    Reflux    Seasonal allergies     Past Surgical History:  Procedure Laterality Date   HIATAL HERNIA REPAIR  04/14/2012   Procedure: LAPAROSCOPIC REPAIR OF HIATAL HERNIA;  Surgeon: Donnice KATHEE Lunger, MD;  Location: [facility];  Service: General;  Laterality: N/A;  Hiatal Hernia  Repair-Replacement of Lap Band   LAPAROSCOPIC GASTRIC BANDING  08/08/2008   PARATHYROIDECTOMY Left 06/02/2020   Procedure: LEFT INFERIOR PARATHYROIDECTOMY;  Surgeon: Eletha Boas, MD;  Location: [facility];  Service: General;  Laterality: Left;   THYROIDECTOMY N/A 06/02/2020   Procedure: TOTAL THYROIDECTOMY;  Surgeon: Eletha Boas, MD;  Location: [facility];  Service: General;  Laterality: N/A;   TONSILLECTOMY  1981    Family History  Problem Relation Age of Onset   Diabetes Mother    Obesity Mother    Colon polyps Mother    Colon cancer Mother    Obesity Father    Hypertension Father    Obesity Sister     Social History:  reports that she has never smoked. She has never used smokeless tobacco. She reports that she does not drink alcohol and does not use drugs.  Allergies:  Allergies  Allergen Reactions   Cephalosporins Anaphylaxis and Other (See Comments)    Caused hypotension and a fever   Egg Protein-Containing Drug Products Hives, Swelling and Other (See Comments)    Angioedema   Penicillins Anaphylaxis    Has patient had a PCN reaction causing immediate rash, facial/tongue/throat swelling, SOB or lightheadedness with hypotension: Yes Has patient had a PCN reaction causing severe rash involving mucus membranes or skin necrosis: No Has patient had a PCN reaction that required hospitalization: Observed X12 hrs Has patient had a PCN reaction occurring within the last 10 years: No If all of the above  answers are NO, then may proceed with Cephalosporin use.    Sulfa Drugs Cross Reactors Other (See Comments)    Liver failure   Adhesive [Tape] Hives and Itching    Cannot wear for any length of time   Effexor  [Venlafaxine ] Other (See Comments)    Lightheadedness, dizziness, and headaches resulted    Medications: Scheduled: Continuous:  sodium chloride       No results found for this or any previous visit (from the past 24 hours).   No results found.  ROS:  As stated above in  the HPI otherwise negative.  Blood pressure (!) 112/91, pulse 84, temperature (!) 97.3 F (36.3 C), temperature source Temporal, resp. rate (!) 25, height 5' 9 (1.753 m), weight (!) 137 kg, SpO2 95%.    PE: Gen: NAD, Alert and Oriented HEENT:  [place]/AT, EOMI Neck: Supple, no LAD Lungs: CTA Bilaterally CV: RRR without M/G/R ABD: Soft, NTND, +BS Ext: No C/C/E  Assessment/Plan: Screening colonoscopy.  Bernie Ransford D 10/08/2024, 9:09 AM

## 2024-10-11 ENCOUNTER — Ambulatory Visit: Admitting: Psychology

## 2024-10-11 ENCOUNTER — Encounter (HOSPITAL_COMMUNITY): Payer: Self-pay | Admitting: Gastroenterology

## 2024-10-11 DIAGNOSIS — F411 Generalized anxiety disorder: Secondary | ICD-10-CM | POA: Diagnosis not present

## 2024-10-11 LAB — SURGICAL PATHOLOGY

## 2024-10-11 NOTE — Progress Notes (Signed)
 PROGRESS NOTE:   Name: Olivia [name] Date: 10/11/2024 MRN: [id number] DOB: October 14, 1966 PCP: Jodie Lavern CROME, MD  Time spent: 4:00 PM - 4:58 PM  Annual Review: 05/31/2025  Today I met with  Olivia [name] in remote video (Careg[facility]) face-to-face individual psychotherapy.  Distance Site: [name]: Dr Edison Remote Office Consent: Obtained verbal consent to transmit session remotely.   Patient is aware of the limitations inherent in participating in virtual therapy.   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 She is normally a high functioning and driven individual and it is a struggle to get motivated to do anything. If it is necessary for work she does it and does a good job, but it is not the same. She has gained 70 pounds in less than 9 months and it has impacted her self image. She is also peri menopausal and these hormonal changes are also likely to be contributing to her changing mood states.   Background History: Olivia [name] is contending with aging parents.  Her mother was diagnosed this year with vascular dementia, and her father had quadrupel by-pass surgery nearly two years ago, but continues to have other medical complications, and was most recently diagnosed with cancer. She is the medical point person for her family.  She is the main person who is supporting and providing guidance to her aging parents.  Olivia [name] has one sister, but her sister struggles to manage her own life and that of her daughter.   Skyah states that she was diagnosed with adult ADHD and responded well to medications.  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)   Son: Olivia [name] (17) he is on the ASD, he is a Holiday Representative and attends [employer]   Mental Status Exam: Appearance:   Casual     Behavior:  Appropriate  Motor:   Normal  Speech/Language:   NA  Affect:  Appropriate  Mood:  normal  Thought process:  normal  Thought content:    WNL  Sensory/Perceptual disturbances:    WNL  Orientation:  oriented to person, place, time/date, situation, and day of week  Attention:  Good  Concentration:  Good  Memory:  WNL  Fund of knowledge:   Good  Insight:    Good  Judgment:   Good  Impulse Control:  Good        Individualized Treatment Plan                Strengths: bright, verbal, motivated, self aware, self advocate   Supports: spouse, family, friends, colleagues   Goal/Needs for Treatment:  In order of importance to patient  1) Develop healthy interpersonal relationships that lead to the alleviation and help prevent the relapse of depression.  2) Learn and implement coping skills that result in a reduction of anxiety and worry, and improved functioning.   3) Combine skills learned in therapy into a new daily approach to managing ADHD.   Client Statement of Needs: requests assistance with piecing back together her life as it once was before she got depressed (burnt out).   Treatment Level: Weekly Individual Outpatient Psychotherapy.  Symptoms: autonomic hyperactivity (e.g., palpitations, shortness of breath, dry mouth, trouble swallowing, nausea, diarrhea).  Childhood history of Attention Deficit Disorder (ADD) that was either diagnosed or later concluded due to the symptoms of behavioral  problems at school, impulsivity, temper outbursts, and lack of concentration.  Depressed or irritable mood. Diminished interest in or enjoyment of activities. Easily distracted and drawn from task at hand.  Excessive and/or unrealistic worry that is difficult to control occurring more days than not for at least 6 months about a number of events or activities.  Feelings of hopelessness, worthlessness, or inappropriate guilt.  Lack of energy.   Poor concentration and indecisiveness.  Restless and fidgety; unable  to be sedentary for more than a short time.   Client Treatment Preferences: Continue with current therapist   Healthcare consumer's goal for treatment:  Psychologist, Ronal Jenkins Sprung, Ph.D. will support the patient's ability to achieve the goals identified. Cognitive Behavioral Therapy, Dialectical Behavioral Therapy, Motivational Interviewing, SPACE, parent training, and other evidenced-based practices will be used to promote progress towards healthy functioning.   Healthcare consumer Olivia [name] will: Actively participate in therapy, working towards healthy functioning.    *Justification for Continuation/Discontinuation of Goal: R=Revised, O=Ongoing, A=Achieved, D=Discontinued  Goal 1) Develop healthy interpersonal relationships that lead to the alleviation and help prevent the relapse of depression.  5 Point Likert rating baseline date: 05/28/2021 Target Date Goal Was reviewed Status Code Progress towards goal/Likert rating  05/29/2023 05/28/2022          O 3/5 - learning to set better limits and boundaries, still hasn't been able to regulate her daily routines  05/25/2024 05/26/2023          O        3.75 - pt reestablished friendship but has not be consistent in reaching out, has improved her relationship with her parents with appropriate limits.  Is more accepting of her relationship with her estranged daughter as healthy for her and not a failure   05/31/2025 05/31/2024  4/5 - pt has established some new friendships, and is better at planning social events in order to spend time with these new friends   Goal 2) Learn and implement coping skills that result in a reduction of anxiety and worry, and improved daily functioning.  5 Point Likert rating baseline date: 05/28/2021  Target Date Goal Was reviewed Status Code Progress towards goal/Likert rating  05/29/2023 05/28/2022          O 2/5 - learning to use her skills more consistently  05/25/2024 05/26/2023          O 3.5 - pt is  learning to use her skills and better manage her unrealistic and perfectionist tendencies that keep her stressed and anxious  05/31/2025 05/31/2024          O 4/5 - pt is able to head off the anxiety spirals that used to over take her,     Goal 3) Combine skills learned in therapy into a new daily approach to managing ADHD.  5 Point Likert rating baseline date: 05/28/2021  Target Date Goal Was reviewed Status Code Progress towards goal/Likert rating  05/29/2023 05/28/2022          O 2/5 - learning to use her skills more consistently  05/25/2024 05/26/2023          O 3.75 - accepted the need and began medication, has been more aware, recognize, acknowledge and accept the issues related ADHD and is better able to apply strategies. Pt is kinder to herself around her limitations or need for an alternative way of approaching tasks  05/31/2025 05/31/2024          O 3.25/5 - pt feels  like she has regressed some, returning to past patterns of avoidance and procrastination   This plan has been reviewed and created by the following participants:  This plan will be reviewed at least every 12 months. Date Behavioral Olivia Clinician Date Guardian/Patient   05/28/2022 Ronal Jenkins Sprung, Ph.D.   05/28/2022 Olivia [name]  05/26/2023 Ronal Jenkins Sprung, Ph.D.  05/26/2023 Olivia Olivia [name]  05/31/2024 Ronal Jenkins Sprung, Ph.D. 05/31/2024 Olivia [name]         Diagnosis: Major Depressive Disorder, recurrent, in partial remission Generalized Anxiety Disorder Attention Deficit Disorder, predominantly inattentive   Olivia [name] reports that she had a stressful week.  We d/e/p getting through her colonoscopy, dealing with an extended family Olivia crisis, and managing her mother at her doctor's visit.  We d/p that one theme that crosses all three of these situations was a problem with boundaries, needed to learn how to set limits in a way that was comfortable for her, and allowing herself to be the patient.  I  encouraged her to work on writing a new script for how she will manage requests for professional services, etc. when neither the time nor the place is appropriate.  Ronal Jenkins Sprung, PhD  Son: Olivia [name] (17) he is on the ASD Daughter:  Olivia [name]  [employer] [place]

## 2024-10-12 ENCOUNTER — Other Ambulatory Visit: Payer: Self-pay

## 2024-10-15 DIAGNOSIS — G4733 Obstructive sleep apnea (adult) (pediatric): Secondary | ICD-10-CM | POA: Diagnosis not present

## 2024-10-18 ENCOUNTER — Ambulatory Visit: Admitting: Psychology

## 2024-10-18 DIAGNOSIS — F411 Generalized anxiety disorder: Secondary | ICD-10-CM

## 2024-10-18 DIAGNOSIS — Z600 Problems of adjustment to life-cycle transitions: Secondary | ICD-10-CM

## 2024-10-18 DIAGNOSIS — F900 Attention-deficit hyperactivity disorder, predominantly inattentive type: Secondary | ICD-10-CM | POA: Diagnosis not present

## 2024-10-18 DIAGNOSIS — F3341 Major depressive disorder, recurrent, in partial remission: Secondary | ICD-10-CM | POA: Diagnosis not present

## 2024-10-18 NOTE — Progress Notes (Unsigned)
 PROGRESS NOTE:   Name: Olivia [name] Date: 10/18/2024 MRN: [id number] DOB: 04-28-1966 PCP: Jodie Lavern CROME, MD  Time spent: 4:00 PM - 4:58 PM  Annual Review: 05/31/2025  Today I met with  Olivia [name] in remote video (Caregility) face-to-face individual psychotherapy.  Distance Site: [name]: Dr Edison Remote Office Consent: Obtained verbal consent to transmit session remotely.   Patient is aware of the limitations inherent in participating in virtual therapy.   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 She is normally a high functioning and driven individual and it is a struggle to get motivated to do anything. If it is necessary for work she does it and does a good job, but it is not the same. She has gained 70 pounds in less than 9 months and it has impacted her self image. She is also peri menopausal and these hormonal changes are also likely to be contributing to her changing mood states.   Background History: Olivia [name] is contending with aging parents.  Her mother was diagnosed this year with vascular dementia, and her father had quadrupel by-pass surgery nearly two years ago, but continues to have other medical complications, and was most recently diagnosed with cancer. She is the medical point person for her family.  She is the main person who is supporting and providing guidance to her aging parents.  Olivia [name] has one sister, but her sister struggles to manage her own life and that of her daughter.   Olivia [name] states that she was diagnosed with adult ADHD and responded well to medications.  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)   Son: Health Visitor (17) he is on the ASD, he is a Holiday Representative and attends [employer]   Mental Status Exam: Appearance:   Casual     Behavior:  Appropriate  Motor:   Normal  Speech/Language:   NA  Affect:  Appropriate  Mood:  normal  Thought process:  normal  Thought content:    WNL  Sensory/Perceptual disturbances:    WNL  Orientation:  oriented to person, place, time/date, situation, and day of week  Attention:  Good  Concentration:  Good  Memory:  WNL  Fund of knowledge:   Good  Insight:    Good  Judgment:   Good  Impulse Control:  Good        Individualized Treatment Plan                Strengths: bright, verbal, motivated, self aware, self advocate   Supports: spouse, family, friends, colleagues   Goal/Needs for Treatment:  In order of importance to patient  1) Develop healthy interpersonal relationships that lead to the alleviation and help prevent the relapse of depression.  2) Learn and implement coping skills that result in a reduction of anxiety and worry, and improved functioning.   3) Combine skills learned in therapy into a new daily approach to managing ADHD.   Client Statement of Needs: requests assistance with piecing back together her life as it once was before she got depressed (burnt out).   Treatment Level: Weekly Individual Outpatient Psychotherapy.  Symptoms: autonomic hyperactivity (e.g., palpitations, shortness of breath, dry mouth, trouble swallowing, nausea, diarrhea).  Childhood history of Attention Deficit Disorder (ADD) that was either diagnosed or later concluded due to the symptoms of behavioral  problems at school, impulsivity, temper outbursts, and lack of concentration.  Depressed or irritable mood. Diminished interest in or enjoyment of activities. Easily distracted and drawn from task at hand.  Excessive and/or unrealistic worry that is difficult to control occurring more days than not for at least 6 months about a number of events or activities.  Feelings of hopelessness, worthlessness, or inappropriate guilt.  Lack of energy.   Poor concentration and indecisiveness.  Restless and fidgety; unable  to be sedentary for more than a short time.   Client Treatment Preferences: Continue with current therapist   Healthcare consumer's goal for treatment:  Psychologist, Ronal Jenkins Sprung, Ph.D. will support the patient's ability to achieve the goals identified. Cognitive Behavioral Therapy, Dialectical Behavioral Therapy, Motivational Interviewing, SPACE, parent training, and other evidenced-based practices will be used to promote progress towards healthy functioning.   Healthcare consumer Zakiyyah Savannah will: Actively participate in therapy, working towards healthy functioning.    *Justification for Continuation/Discontinuation of Goal: R=Revised, O=Ongoing, A=Achieved, D=Discontinued  Goal 1) Develop healthy interpersonal relationships that lead to the alleviation and help prevent the relapse of depression.  5 Point Likert rating baseline date: 05/28/2021 Target Date Goal Was reviewed Status Code Progress towards goal/Likert rating  05/29/2023 05/28/2022          O 3/5 - learning to set better limits and boundaries, still hasn't been able to regulate her daily routines  05/25/2024 05/26/2023          O        3.75 - pt reestablished friendship but has not be consistent in reaching out, has improved her relationship with her parents with appropriate limits.  Is more accepting of her relationship with her estranged daughter as healthy for her and not a failure   05/31/2025 05/31/2024  4/5 - pt has established some new friendships, and is better at planning social events in order to spend time with these new friends   Goal 2) Learn and implement coping skills that result in a reduction of anxiety and worry, and improved daily functioning.  5 Point Likert rating baseline date: 05/28/2021  Target Date Goal Was reviewed Status Code Progress towards goal/Likert rating  05/29/2023 05/28/2022          O 2/5 - learning to use her skills more consistently  05/25/2024 05/26/2023          O 3.5 - pt is  learning to use her skills and better manage her unrealistic and perfectionist tendencies that keep her stressed and anxious  05/31/2025 05/31/2024          O 4/5 - pt is able to head off the anxiety spirals that used to over take her,     Goal 3) Combine skills learned in therapy into a new daily approach to managing ADHD.  5 Point Likert rating baseline date: 05/28/2021  Target Date Goal Was reviewed Status Code Progress towards goal/Likert rating  05/29/2023 05/28/2022          O 2/5 - learning to use her skills more consistently  05/25/2024 05/26/2023          O 3.75 - accepted the need and began medication, has been more aware, recognize, acknowledge and accept the issues related ADHD and is better able to apply strategies. Pt is kinder to herself around her limitations or need for an alternative way of approaching tasks  05/31/2025 05/31/2024          O 3.25/5 - pt feels  like she has regressed some, returning to past patterns of avoidance and procrastination   This plan has been reviewed and created by the following participants:  This plan will be reviewed at least every 12 months. Date Behavioral Health Clinician Date Guardian/Patient   05/28/2022 Ronal Jenkins Sprung, Ph.D.   05/28/2022 Olivia Olivia [name] [name]  05/26/2023 Ronal Jenkins Sprung, Ph.D.  05/26/2023 Olivia Olivia [name] Parr  05/31/2024 Ronal Jenkins Sprung, Ph.D. 05/31/2024 Olivia Olivia [name] [name]         Diagnosis: Major Depressive Disorder, recurrent, in partial remission Generalized Anxiety Disorder Attention Deficit Disorder, predominantly inattentive   Olivia [name] reports that she did sone honest reflection this past week.  We d/e/p doing self-care for others instead of herself, wanting to change her orientation, and making connections between the past and present.  Lastly, we d/ her upcoming trip to [place]  with her family and parents, the challenges she is working through, and managing expectations.  Ronal Jenkins Sprung, PhD  Son: Spurgeon (17) he is  on the ASD Daughter:  Friend - Amy  Super Ego - Dickey

## 2024-10-21 DIAGNOSIS — Z79899 Other long term (current) drug therapy: Secondary | ICD-10-CM | POA: Diagnosis not present

## 2024-10-21 DIAGNOSIS — L409 Psoriasis, unspecified: Secondary | ICD-10-CM | POA: Diagnosis not present

## 2024-10-21 DIAGNOSIS — L719 Rosacea, unspecified: Secondary | ICD-10-CM | POA: Diagnosis not present

## 2024-10-25 ENCOUNTER — Ambulatory Visit: Admitting: Psychology

## 2024-11-01 ENCOUNTER — Ambulatory Visit: Admitting: Psychology

## 2024-11-01 DIAGNOSIS — F411 Generalized anxiety disorder: Secondary | ICD-10-CM

## 2024-11-01 DIAGNOSIS — Z600 Problems of adjustment to life-cycle transitions: Secondary | ICD-10-CM | POA: Diagnosis not present

## 2024-11-01 NOTE — Progress Notes (Signed)
 "      PROGRESS NOTE:   Name: Olivia [name] Date: 11/01/2024 MRN: [id number] DOB: [birth year] PCP: Jodie Lavern CROME, MD  Time spent: 4:00 PM - 4:58 PM  Annual Review: 05/31/2025  Today I met with  Olivia [name] in remote video (Caregility) face-to-face individual psychotherapy.  Distance Site: [name]: Dr Edison Remote Office Consent: Obtained verbal consent to transmit session remotely.   Patient is aware of the limitations inherent in participating in virtual therapy.   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 She is normally a high functioning and driven individual and it is a struggle to get motivated to do anything. If it is necessary for work she does it and does a good job, but it is not the same. She has gained 70 pounds in less than 9 months and it has impacted her self image. She is also peri menopausal and these hormonal changes are also likely to be contributing to her changing mood states.   Background History: Olivia [name] is contending with aging parents.  Her mother was diagnosed this year with vascular dementia, and her father had quadrupel by-pass surgery nearly two years ago, but continues to have other medical complications, and was most recently diagnosed with cancer. She is the medical point person for her family.  She is the main person who is supporting and providing guidance to her aging parents.  Olivia [name] has one sister, but her sister struggles to manage her own life and that of her daughter.   Nijae states that she was diagnosed with adult ADHD and responded well to medications.  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[employer])   Son: Health Visitor (17) he is on the ASD, he is a Holiday Representative and attends [employer]   Mental Status Exam: Appearance:   Casual     Behavior:  Appropriate  Motor:   Normal  Speech/Language:   NA  Affect:  Appropriate  Mood:  normal  Thought process:  normal  Thought content:    WNL  Sensory/Perceptual disturbances:    WNL  Orientation:  oriented to person, place, time/date, situation, and day of week  Attention:  Good  Concentration:  Good  Memory:  WNL  Fund of knowledge:   Good  Insight:    Good  Judgment:   Good  Impulse Control:  Good        Individualized Treatment Plan                Strengths: bright, verbal, motivated, self aware, self advocate   Supports: spouse, family, friends, colleagues   Goal/Needs for Treatment:  In order of importance to patient  1) Develop healthy interpersonal relationships that lead to the alleviation and help prevent the relapse of depression.  2) Learn and implement coping skills that result in a reduction of anxiety and worry, and improved functioning.   3) Combine skills learned in therapy into a new daily approach to managing ADHD.   Client Statement of Needs: requests assistance with piecing back together her life as it once was before she got depressed (burnt out).   Treatment Level: Weekly Individual Outpatient Psychotherapy.  Symptoms: autonomic hyperactivity (e.g., palpitations, shortness of breath, dry mouth, trouble swallowing, nausea, diarrhea).  Childhood history of Attention Deficit Disorder (ADD) that was either diagnosed or later concluded due to the symptoms of  behavioral problems at school, impulsivity, temper outbursts, and lack of concentration.  Depressed or irritable mood. Diminished interest in or enjoyment of activities. Easily distracted and drawn from task at hand.  Excessive and/or unrealistic worry that is difficult to control occurring more days than not for at least 6 months about a number of events or activities.  Feelings of hopelessness, worthlessness, or inappropriate guilt.  Lack of energy.   Poor concentration and indecisiveness.  Restless and fidgety; unable  to be sedentary for more than a short time.   Client Treatment Preferences: Continue with current therapist   Healthcare consumer's goal for treatment:  Psychologist, Ronal Jenkins Sprung, Ph.D. will support the patient's ability to achieve the goals identified. Cognitive Behavioral Therapy, Dialectical Behavioral Therapy, Motivational Interviewing, SPACE, parent training, and other evidenced-based practices will be used to promote progress towards healthy functioning.   Healthcare consumer Keiandra Sullenger will: Actively participate in therapy, working towards healthy functioning.    *Justification for Continuation/Discontinuation of Goal: R=Revised, O=Ongoing, A=Achieved, D=Discontinued  Goal 1) Develop healthy interpersonal relationships that lead to the alleviation and help prevent the relapse of depression.  5 Point Likert rating baseline date: 05/28/2021 Target Date Goal Was reviewed Status Code Progress towards goal/Likert rating  05/29/2023 05/28/2022          O 3/5 - learning to set better limits and boundaries, still hasn't been able to regulate her daily routines  05/25/2024 05/26/2023          O        3.75 - pt reestablished friendship but has not be consistent in reaching out, has improved her relationship with her parents with appropriate limits.  Is more accepting of her relationship with her estranged daughter as healthy for her and not a failure   05/31/2025 05/31/2024  4/5 - pt has established some new friendships, and is better at planning social events in order to spend time with these new friends   Goal 2) Learn and implement coping skills that result in a reduction of anxiety and worry, and improved daily functioning.  5 Point Likert rating baseline date: 05/28/2021  Target Date Goal Was reviewed Status Code Progress towards goal/Likert rating  05/29/2023 05/28/2022          O 2/5 - learning to use her skills more consistently  05/25/2024 05/26/2023          O 3.5 - pt is  learning to use her skills and better manage her unrealistic and perfectionist tendencies that keep her stressed and anxious  05/31/2025 05/31/2024          O 4/5 - pt is able to head off the anxiety spirals that used to over take her,     Goal 3) Combine skills learned in therapy into a new daily approach to managing ADHD.  5 Point Likert rating baseline date: 05/28/2021  Target Date Goal Was reviewed Status Code Progress towards goal/Likert rating  05/29/2023 05/28/2022          O 2/5 - learning to use her skills more consistently  05/25/2024 05/26/2023          O 3.75 - accepted the need and began medication, has been more aware, recognize, acknowledge and accept the issues related ADHD and is better able to apply strategies. Pt is kinder to herself around her limitations or need for an alternative way of approaching tasks  05/31/2025 05/31/2024          O 3.25/5 - pt  feels like she has regressed some, returning to past patterns of avoidance and procrastination   This plan has been reviewed and created by the following participants:  This plan will be reviewed at least every 12 months. Date Behavioral Health Clinician Date Guardian/Patient   05/28/2022 Ronal Jenkins Sprung, Ph.D.   05/28/2022 Olivia Olivia [name] [name]  05/26/2023 Ronal Jenkins Sprung, Ph.D.  05/26/2023 Olivia Olivia [name] Prajapati  05/31/2024 Ronal Jenkins Sprung, Ph.D. 05/31/2024 Olivia Olivia [name] [name]         Diagnosis: Major Depressive Disorder, recurrent, in partial remission Generalized Anxiety Disorder Attention Deficit Disorder, predominantly inattentive   Olivia [name] reports that she made it through Christmas with her extended family.  We d/e/p her anxieties for her father whose under a lot of pressure to take care of her mother, feeling exhausted with caring for her father and mother, and struggling to say no to her parents.  I provided guidance around setting limits with her parents and offered the support needed.  Ronal Jenkins Sprung, PhD  Son: Spurgeon  (17) he is on the ASD Daughter:  Friend - Amy  Super Ego - Dickey  "

## 2024-11-02 ENCOUNTER — Other Ambulatory Visit: Payer: Self-pay

## 2024-11-04 ENCOUNTER — Other Ambulatory Visit (HOSPITAL_BASED_OUTPATIENT_CLINIC_OR_DEPARTMENT_OTHER): Payer: Self-pay

## 2024-11-05 ENCOUNTER — Other Ambulatory Visit (HOSPITAL_COMMUNITY): Payer: Self-pay

## 2024-11-08 ENCOUNTER — Ambulatory Visit: Admitting: Psychology

## 2024-11-08 DIAGNOSIS — F411 Generalized anxiety disorder: Secondary | ICD-10-CM | POA: Diagnosis not present

## 2024-11-08 DIAGNOSIS — F3341 Major depressive disorder, recurrent, in partial remission: Secondary | ICD-10-CM | POA: Diagnosis not present

## 2024-11-08 DIAGNOSIS — F900 Attention-deficit hyperactivity disorder, predominantly inattentive type: Secondary | ICD-10-CM | POA: Diagnosis not present

## 2024-11-08 DIAGNOSIS — Z600 Problems of adjustment to life-cycle transitions: Secondary | ICD-10-CM | POA: Diagnosis not present

## 2024-11-08 NOTE — Progress Notes (Signed)
 "      PROGRESS NOTE:   Name: Olivia [name] Date: 11/08/2024 MRN: [id number] DOB: 10-09-1966 PCP: Jodie Lavern CROME, MD  Time spent: 4:00 PM - 4:58 PM  Annual Review: 05/31/2025  Today I met with  Olivia [name] Ship in remote video (Caregility) face-to-face individual psychotherapy.  Distance Site: [name]: Dr Edison Remote Office Consent: Obtained verbal consent to transmit session remotely.   Patient is aware of the limitations inherent in participating in virtual therapy.   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 She is normally a high functioning and driven individual and it is a struggle to get motivated to do anything. If it is necessary for work she does it and does a good job, but it is not the same. She has gained 70 pounds in less than 9 months and it has impacted her self image. She is also peri menopausal and these hormonal changes are also likely to be contributing to her changing mood states.   Background History: Olivia [name] is contending with aging parents.  Her mother was diagnosed this year with vascular dementia, and her father had quadrupel by-pass surgery nearly two years ago, but continues to have other medical complications, and was most recently diagnosed with cancer. She is the medical point person for her family.  She is the main person who is supporting and providing guidance to her aging parents.  Olivia [name] has one sister, but her sister struggles to manage her own life and that of her daughter.   Olivia [name] states that she was diagnosed with adult ADHD and responded well to medications.  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[employer])   Son: Health Visitor (17) he is on the ASD, he is a Holiday Representative and attends [employer]   Mental Status Exam: Appearance:   Casual     Behavior:  Appropriate  Motor:   Normal  Speech/Language:   NA  Affect:  Appropriate  Mood:  normal  Thought process:  normal  Thought content:    WNL  Sensory/Perceptual disturbances:    WNL  Orientation:  oriented to person, place, time/date, situation, and day of week  Attention:  Good  Concentration:  Good  Memory:  WNL  Fund of knowledge:   Good  Insight:    Good  Judgment:   Good  Impulse Control:  Good    Individualized Treatment Plan                 Strengths: bright, verbal, motivated, self aware, self advocate   Supports: spouse, family, friends, colleagues   Goal/Needs for Treatment:  In order of importance to patient  1) Develop healthy interpersonal relationships that lead to the alleviation and help prevent the relapse of depression.  2) Learn and implement coping skills that result in a reduction of anxiety and worry, and improved functioning.   3) Combine skills learned in therapy into a new daily approach to managing ADHD.   Client Statement of Needs: requests assistance with piecing back together her life as it once was before she got depressed (burnt out).   Treatment Level: Weekly Individual Outpatient Psychotherapy.  Symptoms: autonomic hyperactivity (e.g., palpitations, shortness of breath, dry mouth, trouble swallowing, nausea, diarrhea).  Childhood history of Attention Deficit Disorder (ADD) that was either diagnosed or later concluded due to the symptoms of behavioral problems at  school, impulsivity, temper outbursts, and lack of concentration.  Depressed or irritable mood. Diminished interest in or enjoyment of activities. Easily distracted and drawn from task at hand.  Excessive and/or unrealistic worry that is difficult to control occurring more days than not for at least 6 months about a number of events or activities.  Feelings of hopelessness, worthlessness, or inappropriate guilt.  Lack of energy.   Poor concentration and indecisiveness.  Restless and fidgety; unable to be  sedentary for more than a short time.   Client Treatment Preferences: Continue with current therapist   Healthcare consumer's goal for treatment:  Psychologist, Ronal Jenkins Sprung, Ph.D. will support the patient's ability to achieve the goals identified. Cognitive Behavioral Therapy, Dialectical Behavioral Therapy, Motivational Interviewing, SPACE, parent training, and other evidenced-based practices will be used to promote progress towards healthy functioning.   Healthcare consumer Lizzete Gough will: Actively participate in therapy, working towards healthy functioning.    *Justification for Continuation/Discontinuation of Goal: R=Revised, O=Ongoing, A=Achieved, D=Discontinued  Goal 1) Develop healthy interpersonal relationships that lead to the alleviation and help prevent the relapse of depression.  5 Point Likert rating baseline date: 05/28/2021 Target Date Goal Was reviewed Status Code Progress towards goal/Likert rating  05/29/2023 05/28/2022          O 3/5 - learning to set better limits and boundaries, still hasn't been able to regulate her daily routines  05/25/2024 05/26/2023          O        3.75 - pt reestablished friendship but has not be consistent in reaching out, has improved her relationship with her parents with appropriate limits.  Is more accepting of her relationship with her estranged daughter as healthy for her and not a failure   05/31/2025 05/31/2024  4/5 - pt has established some new friendships, and is better at planning social events in order to spend time with these new friends   Goal 2) Learn and implement coping skills that result in a reduction of anxiety and worry, and improved daily functioning.  5 Point Likert rating baseline date: 05/28/2021  Target Date Goal Was reviewed Status Code Progress towards goal/Likert rating  05/29/2023 05/28/2022          O 2/5 - learning to use her skills more consistently  05/25/2024 05/26/2023          O 3.5 - pt is learning to  use her skills and better manage her unrealistic and perfectionist tendencies that keep her stressed and anxious  05/31/2025 05/31/2024          O 4/5 - pt is able to head off the anxiety spirals that used to over take her,     Goal 3) Combine skills learned in therapy into a new daily approach to managing ADHD.  5 Point Likert rating baseline date: 05/28/2021  Target Date Goal Was reviewed Status Code Progress towards goal/Likert rating  05/29/2023 05/28/2022          O 2/5 - learning to use her skills more consistently  05/25/2024 05/26/2023          O 3.75 - accepted the need and began medication, has been more aware, recognize, acknowledge and accept the issues related ADHD and is better able to apply strategies. Pt is kinder to herself around her limitations or need for an alternative way of approaching tasks  05/31/2025 05/31/2024          O 3.25/5 - pt feels like she  has regressed some, returning to past patterns of avoidance and procrastination   This plan has been reviewed and created by the following participants:  This plan will be reviewed at least every 12 months. Date Behavioral Health Clinician Date Guardian/Patient   05/28/2022 Ronal Jenkins Sprung, Ph.D.   05/28/2022 Olivia Olivia [name] Ship  05/26/2023 Ronal Jenkins Sprung, Ph.D.  05/26/2023 Olivia Olivia [name] Amescua  05/31/2024 Ronal Jenkins Sprung, Ph.D. 05/31/2024 Olivia Olivia [name] Ship         Diagnosis: Major Depressive Disorder, recurrent, in partial remission Generalized Anxiety Disorder Attention Deficit Disorder, predominantly inattentive   Olivia [name] reports that she did a terrible job of setting limits on her parents.  We d/e/p feeling exhausted, the challenges of preparing her parents for their trip to [place] , and the feelings/issues this outing stirred up for her.  We made connections between the past and the present, thwarted desires, and learning to accept where things are at.    I provided guidance around setting limits with her parents  and offered the support needed.  Ronal Jenkins Sprung, PhD  Son: Spurgeon (17) he is on the ASD Daughter:  Friend - Amy  Super Ego - Dickey  "

## 2024-11-09 ENCOUNTER — Other Ambulatory Visit: Payer: Self-pay

## 2024-11-09 NOTE — Progress Notes (Signed)
 Specialty Pharmacy Refill Coordination Note  Olivia [name] is a 59 y.o. female contacted today regarding refills of specialty medication(s) Ixekizumab  (Taltz )   Patient requested [facility] at [facility] [place] date: 11/09/24   Medication will be filled on: 11/09/24

## 2024-11-11 ENCOUNTER — Other Ambulatory Visit (HOSPITAL_COMMUNITY): Payer: Self-pay

## 2024-11-15 ENCOUNTER — Ambulatory Visit: Admitting: Psychology

## 2024-11-18 ENCOUNTER — Ambulatory Visit (INDEPENDENT_AMBULATORY_CARE_PROVIDER_SITE_OTHER): Admitting: Psychology

## 2024-11-18 DIAGNOSIS — Z600 Problems of adjustment to life-cycle transitions: Secondary | ICD-10-CM | POA: Diagnosis not present

## 2024-11-18 DIAGNOSIS — F900 Attention-deficit hyperactivity disorder, predominantly inattentive type: Secondary | ICD-10-CM

## 2024-11-18 DIAGNOSIS — F411 Generalized anxiety disorder: Secondary | ICD-10-CM | POA: Diagnosis not present

## 2024-11-18 DIAGNOSIS — F331 Major depressive disorder, recurrent, moderate: Secondary | ICD-10-CM

## 2024-11-18 NOTE — Progress Notes (Signed)
 "      PROGRESS NOTE:   Name: Olivia [name] Date: 11/18/2024 MRN: [id number] DOB: [birth year] PCP: Jodie Lavern CROME, MD  Time spent: 4:00 PM - 4:58 PM  Annual Review: 05/31/2025  Today I met with  Olivia [name] in remote video (Caregility) face-to-face individual psychotherapy.  Distance Site: [name]: Dr Edison Remote Office Consent: Obtained verbal consent to transmit session remotely.   Patient is aware of the limitations inherent in participating in virtual therapy.   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 She is normally a high functioning and driven individual and it is a struggle to get motivated to do anything. If it is necessary for work she does it and does a good job, but it is not the same. She has gained 70 pounds in less than 9 months and it has impacted her self image. She is also peri menopausal and these hormonal changes are also likely to be contributing to her changing mood states.   Background History: Olivia [name] is contending with aging parents.  Her mother was diagnosed this year with vascular dementia, and her father had quadrupel by-pass surgery nearly two years ago, but continues to have other medical complications, and was most recently diagnosed with cancer. She is the medical point person for her family.  She is the main person who is supporting and providing guidance to her aging parents.  Olivia [name] has one sister, but her sister struggles to manage her own life and that of her daughter.   Olivia [name] states that she was diagnosed with adult ADHD and responded well to medications.  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[employer])   Son: Olivia [name] (17) he is on the ASD, he is a Holiday Representative and attends [employer]   Mental Status Exam: Appearance:   Casual     Behavior:  Appropriate  Motor:   Normal  Speech/Language:   NA  Affect:  Appropriate  Mood:  normal  Thought process:  normal  Thought content:    WNL  Sensory/Perceptual disturbances:    WNL  Orientation:  oriented to person, place, time/date, situation, and day of week  Attention:  Good  Concentration:  Good  Memory:  WNL  Fund of knowledge:   Good  Insight:    Good  Judgment:   Good  Impulse Control:  Good    Individualized Treatment Plan                 Strengths: bright, verbal, motivated, self aware, self advocate   Supports: spouse, family, friends, colleagues   Goal/Needs for Treatment:  In order of importance to patient  1) Develop healthy interpersonal relationships that lead to the alleviation and help prevent the relapse of depression.  2) Learn and implement coping skills that result in a reduction of anxiety and worry, and improved functioning.   3) Combine skills learned in therapy into a new daily approach to managing ADHD.   Client Statement of Needs: requests assistance with piecing back together her life as it once was before she got depressed (burnt out).   Treatment Level: Weekly Individual Outpatient Psychotherapy.  Symptoms: autonomic hyperactivity (e.g., palpitations, shortness of breath, dry mouth, trouble swallowing, nausea, diarrhea).  Childhood history of Attention Deficit Disorder (ADD) that was either diagnosed or later concluded due to the symptoms of behavioral problems at  school, impulsivity, temper outbursts, and lack of concentration.  Depressed or irritable mood. Diminished interest in or enjoyment of activities. Easily distracted and drawn from task at hand.  Excessive and/or unrealistic worry that is difficult to control occurring more days than not for at least 6 months about a number of events or activities.  Feelings of hopelessness, worthlessness, or inappropriate guilt.  Lack of energy.   Poor concentration and indecisiveness.  Restless and fidgety; unable to be  sedentary for more than a short time.   Client Treatment Preferences: Continue with current therapist   Healthcare consumer's goal for treatment:  Psychologist, Ronal Jenkins Sprung, Ph.D. will support the patient's ability to achieve the goals identified. Cognitive Behavioral Therapy, Dialectical Behavioral Therapy, Motivational Interviewing, SPACE, parent training, and other evidenced-based practices will be used to promote progress towards healthy functioning.   Healthcare consumer Olivia [name] will: Actively participate in therapy, working towards healthy functioning.    *Justification for Continuation/Discontinuation of Goal: R=Revised, O=Ongoing, A=Achieved, D=Discontinued  Goal 1) Develop healthy interpersonal relationships that lead to the alleviation and help prevent the relapse of depression.  5 Point Likert rating baseline date: 05/28/2021 Target Date Goal Was reviewed Status Code Progress towards goal/Likert rating  05/29/2023 05/28/2022          O 3/5 - learning to set better limits and boundaries, still hasn't been able to regulate her daily routines  05/25/2024 05/26/2023          O        3.75 - pt reestablished friendship but has not be consistent in reaching out, has improved her relationship with her parents with appropriate limits.  Is more accepting of her relationship with her estranged daughter as healthy for her and not a failure   05/31/2025 05/31/2024  4/5 - pt has established some new friendships, and is better at planning social events in order to spend time with these new friends   Goal 2) Learn and implement coping skills that result in a reduction of anxiety and worry, and improved daily functioning.  5 Point Likert rating baseline date: 05/28/2021  Target Date Goal Was reviewed Status Code Progress towards goal/Likert rating  05/29/2023 05/28/2022          O 2/5 - learning to use her skills more consistently  05/25/2024 05/26/2023          O 3.5 - pt is learning to  use her skills and better manage her unrealistic and perfectionist tendencies that keep her stressed and anxious  05/31/2025 05/31/2024          O 4/5 - pt is able to head off the anxiety spirals that used to over take her,     Goal 3) Combine skills learned in therapy into a new daily approach to managing ADHD.  5 Point Likert rating baseline date: 05/28/2021  Target Date Goal Was reviewed Status Code Progress towards goal/Likert rating  05/29/2023 05/28/2022          O 2/5 - learning to use her skills more consistently  05/25/2024 05/26/2023          O 3.75 - accepted the need and began medication, has been more aware, recognize, acknowledge and accept the issues related ADHD and is better able to apply strategies. Pt is kinder to herself around her limitations or need for an alternative way of approaching tasks  05/31/2025 05/31/2024          O 3.25/5 - pt feels like she  has regressed some, returning to past patterns of avoidance and procrastination   This plan has been reviewed and created by the following participants:  This plan will be reviewed at least every 12 months. Date Behavioral Olivia Clinician Date Guardian/Patient   05/28/2022 Ronal Jenkins Sprung, Ph.D.   05/28/2022 Olivia [name]  05/26/2023 Ronal Jenkins Sprung, Ph.D.  05/26/2023 Olivia [name]  05/31/2024 Ronal Jenkins Sprung, Ph.D. 05/31/2024 Olivia [name]         Diagnosis: Major Depressive Disorder, recurrent, in partial remission Generalized Anxiety Disorder Attention Deficit Disorder, predominantly inattentive   Olivia [name] reports that she was still overwhelmed by her travels to [place]  with her family and her parents.  We d/e/p all that terrible' things that occurred, her mother being so much worse than she ever expected and realizing how much her mother has been masking her symptoms.  Her mother's BPD was on full display and her mother said many hurtful things to her which required p/ and understanding from the  perspective of personality disorderedness.  I provided support, encouragement and guidance for how to proceed.  I provided guidance around setting limits with her parents and offered the support needed.  Ronal Jenkins Sprung, PhD  Son: Olivia [name] (17) he is on the ASD Daughter:  Friend - Amy  Super Ego - Dickey  "

## 2024-11-22 ENCOUNTER — Ambulatory Visit: Admitting: Psychology

## 2024-11-22 ENCOUNTER — Other Ambulatory Visit: Payer: Self-pay

## 2024-11-22 ENCOUNTER — Other Ambulatory Visit (HOSPITAL_BASED_OUTPATIENT_CLINIC_OR_DEPARTMENT_OTHER): Payer: Self-pay

## 2024-11-22 ENCOUNTER — Other Ambulatory Visit: Payer: Self-pay | Admitting: Family Medicine

## 2024-11-23 ENCOUNTER — Encounter (HOSPITAL_BASED_OUTPATIENT_CLINIC_OR_DEPARTMENT_OTHER): Payer: Self-pay | Admitting: Pharmacist

## 2024-11-23 ENCOUNTER — Other Ambulatory Visit (HOSPITAL_BASED_OUTPATIENT_CLINIC_OR_DEPARTMENT_OTHER): Payer: Self-pay

## 2024-11-23 ENCOUNTER — Other Ambulatory Visit: Payer: Self-pay

## 2024-11-23 MED ORDER — BUPROPION HCL ER (XL) 150 MG PO TB24
150.0000 mg | ORAL_TABLET | Freq: Every day | ORAL | 3 refills | Status: AC
  Filled 2024-11-23: qty 90, 90d supply, fill #0

## 2024-11-23 NOTE — Telephone Encounter (Signed)
 03/09/2024 LOV  11/21/2023 fill date  90/0 refills

## 2024-11-25 ENCOUNTER — Ambulatory Visit: Admitting: Psychology

## 2024-11-25 DIAGNOSIS — F324 Major depressive disorder, single episode, in partial remission: Secondary | ICD-10-CM | POA: Diagnosis not present

## 2024-11-25 DIAGNOSIS — Z600 Problems of adjustment to life-cycle transitions: Secondary | ICD-10-CM

## 2024-11-25 NOTE — Progress Notes (Signed)
 "      PROGRESS NOTE:   Name: Olivia [name] Date: 11/25/2024 MRN: [id number] DOB: 09-24-1966 PCP: Jodie Lavern CROME, MD  Time spent: 49:00 AM - 9:58 AM   Annual Review: 05/31/2025  Today I met with  Olivia [name] in remote video (Caregility) face-to-face individual psychotherapy.  Distance Site: [name]: Dr Edison Remote Office Consent: Obtained verbal consent to transmit session remotely.   Patient is aware of the limitations inherent in participating in virtual therapy.   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 She is normally a high functioning and driven individual and it is a struggle to get motivated to do anything. If it is necessary for work she does it and does a good job, but it is not the same. She has gained 70 pounds in less than 9 months and it has impacted her self image. She is also peri menopausal and these hormonal changes are also likely to be contributing to her changing mood states.   Background History: Olivia [name] is contending with aging parents.  Her mother was diagnosed this year with vascular dementia, and her father had quadrupel by-pass surgery nearly two years ago, but continues to have other medical complications, and was most recently diagnosed with cancer. She is the medical point person for her family.  She is the main person who is supporting and providing guidance to her aging parents.  Olivia [name] has one sister, but her sister struggles to manage her own life and that of her daughter.   Olivia [name] states that she was diagnosed with adult ADHD and responded well to medications.  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[employer])   Son: Health Visitor (17) he is on the ASD, he is a Holiday Representative and attends [employer]   Mental Status Exam: Appearance:   Casual     Behavior:  Appropriate   Motor:  Normal  Speech/Language:   NA  Affect:  Appropriate  Mood:  normal  Thought process:  normal  Thought content:    WNL  Sensory/Perceptual disturbances:    WNL  Orientation:  oriented to person, place, time/date, situation, and day of week  Attention:  Good  Concentration:  Good  Memory:  WNL  Fund of knowledge:   Good  Insight:    Good  Judgment:   Good  Impulse Control:  Good    Individualized Treatment Plan                 Strengths: bright, verbal, motivated, self aware, self advocate   Supports: spouse, family, friends, colleagues   Goal/Needs for Treatment:  In order of importance to patient  1) Develop healthy interpersonal relationships that lead to the alleviation and help prevent the relapse of depression.  2) Learn and implement coping skills that result in a reduction of anxiety and worry, and improved functioning.   3) Combine skills learned in therapy into a new daily approach to managing ADHD.   Client Statement of Needs: requests assistance with piecing back together her life as it once was before she got depressed (burnt out).   Treatment Level: Weekly Individual Outpatient Psychotherapy.  Symptoms: autonomic hyperactivity (e.g., palpitations, shortness of breath, dry mouth, trouble swallowing, nausea, diarrhea).  Childhood history of Attention Deficit Disorder (ADD) that was either diagnosed or later concluded due to the symptoms of behavioral problems  at school, impulsivity, temper outbursts, and lack of concentration.  Depressed or irritable mood. Diminished interest in or enjoyment of activities. Easily distracted and drawn from task at hand.  Excessive and/or unrealistic worry that is difficult to control occurring more days than not for at least 6 months about a number of events or activities.  Feelings of hopelessness, worthlessness, or inappropriate guilt.  Lack of energy.   Poor concentration and indecisiveness.  Restless and fidgety;  unable to be sedentary for more than a short time.   Client Treatment Preferences: Continue with current therapist   Healthcare consumer's goal for treatment:  Psychologist, Ronal Jenkins Sprung, Ph.D. will support the patient's ability to achieve the goals identified. Cognitive Behavioral Therapy, Dialectical Behavioral Therapy, Motivational Interviewing, SPACE, parent training, and other evidenced-based practices will be used to promote progress towards healthy functioning.   Healthcare consumer Madlynn Lundeen will: Actively participate in therapy, working towards healthy functioning.    *Justification for Continuation/Discontinuation of Goal: R=Revised, O=Ongoing, A=Achieved, D=Discontinued  Goal 1) Develop healthy interpersonal relationships that lead to the alleviation and help prevent the relapse of depression.  5 Point Likert rating baseline date: 05/28/2021 Target Date Goal Was reviewed Status Code Progress towards goal/Likert rating  05/29/2023 05/28/2022          O 3/5 - learning to set better limits and boundaries, still hasn't been able to regulate her daily routines  05/25/2024 05/26/2023          O        3.75 - pt reestablished friendship but has not be consistent in reaching out, has improved her relationship with her parents with appropriate limits.  Is more accepting of her relationship with her estranged daughter as healthy for her and not a failure   05/31/2025 05/31/2024  4/5 - pt has established some new friendships, and is better at planning social events in order to spend time with these new friends   Goal 2) Learn and implement coping skills that result in a reduction of anxiety and worry, and improved daily functioning.  5 Point Likert rating baseline date: 05/28/2021  Target Date Goal Was reviewed Status Code Progress towards goal/Likert rating  05/29/2023 05/28/2022          O 2/5 - learning to use her skills more consistently  05/25/2024 05/26/2023          O 3.5 - pt is  learning to use her skills and better manage her unrealistic and perfectionist tendencies that keep her stressed and anxious  05/31/2025 05/31/2024          O 4/5 - pt is able to head off the anxiety spirals that used to over take her,     Goal 3) Combine skills learned in therapy into a new daily approach to managing ADHD.  5 Point Likert rating baseline date: 05/28/2021  Target Date Goal Was reviewed Status Code Progress towards goal/Likert rating  05/29/2023 05/28/2022          O 2/5 - learning to use her skills more consistently  05/25/2024 05/26/2023          O 3.75 - accepted the need and began medication, has been more aware, recognize, acknowledge and accept the issues related ADHD and is better able to apply strategies. Pt is kinder to herself around her limitations or need for an alternative way of approaching tasks  05/31/2025 05/31/2024          O 3.25/5 - pt feels like  she has regressed some, returning to past patterns of avoidance and procrastination   This plan has been reviewed and created by the following participants:  This plan will be reviewed at least every 12 months. Date Behavioral Health Clinician Date Guardian/Patient   05/28/2022 Ronal Jenkins Sprung, Ph.D.   05/28/2022 Olivia Olivia [name] [name]  05/26/2023 Ronal Jenkins Sprung, Ph.D.  05/26/2023 Olivia Olivia [name] Snuffer  05/31/2024 Ronal Jenkins Sprung, Ph.D. 05/31/2024 Olivia Olivia [name] [name]         Diagnosis: Major Depressive Disorder, recurrent, in partial remission Generalized Anxiety Disorder Attention Deficit Disorder, predominantly inattentive   Olivia [name] reports that her back went out this week. We d/e/p her realization how her body manifests her stress, enforcing limits with her parents, and recognizing that she needs to live her life.  We also d/p that she genuinely wants to retire early, plans to contact her financial planner, and determination to do the things she wants to do and not wait until she can't enjoy her retirement.  I  provided guidance around setting limits with her parents and offered the support needed.  Ronal Jenkins Sprung, PhD  Son: Spurgeon (17) he is on the ASD Daughter:  Friend - Amy  Super Ego - Dickey  "

## 2024-11-29 ENCOUNTER — Ambulatory Visit: Admitting: Psychology

## 2024-11-29 DIAGNOSIS — Z600 Problems of adjustment to life-cycle transitions: Secondary | ICD-10-CM

## 2024-11-29 DIAGNOSIS — F411 Generalized anxiety disorder: Secondary | ICD-10-CM | POA: Diagnosis not present

## 2024-11-29 DIAGNOSIS — F900 Attention-deficit hyperactivity disorder, predominantly inattentive type: Secondary | ICD-10-CM | POA: Diagnosis not present

## 2024-11-29 DIAGNOSIS — F324 Major depressive disorder, single episode, in partial remission: Secondary | ICD-10-CM

## 2024-11-29 DIAGNOSIS — F3341 Major depressive disorder, recurrent, in partial remission: Secondary | ICD-10-CM | POA: Diagnosis not present

## 2024-11-29 NOTE — Progress Notes (Signed)
 "      PROGRESS NOTE:   Name: Olivia [name] Date: 11/29/2024 MRN: [id number] DOB: 1966/05/18 PCP: Jodie Lavern CROME, MD  Time spent: 49:00 AM - 9:58 AM   Annual Review: 05/31/2025  Today I met with  Olivia [name] in remote video ([facility]) face-to-face individual psychotherapy.  Distance Site: [name]: Dr Edison Remote Office Consent: Obtained verbal consent to transmit session remotely.   Patient is aware of the limitations inherent in participating in virtual therapy.   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 She is normally a high functioning and driven individual and it is a struggle to get motivated to do anything. If it is necessary for work she does it and does a good job, but it is not the same. She has gained 70 pounds in less than 9 months and it has impacted her self image. She is also peri menopausal and these hormonal changes are also likely to be contributing to her changing mood states.   Background History: Olivia [name] is contending with aging parents.  Her mother was diagnosed this year with vascular dementia, and her father had quadrupel by-pass surgery nearly two years ago, but continues to have other medical complications, and was most recently diagnosed with cancer. She is the medical point person for her family.  She is the main person who is supporting and providing guidance to her aging parents.  Olivia [name] has one sister, but her sister struggles to manage her own life and that of her daughter.   Ileen states that she was diagnosed with adult ADHD and responded well to medications.  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 [employer])   Son: Health Visitor (17) he is on the ASD, he is a Holiday Representative and attends [employer]   Mental Status Exam: Appearance:   Casual     Behavior:  Appropriate   Motor:  Normal  Speech/Language:   NA  Affect:  Appropriate  Mood:  normal  Thought process:  normal  Thought content:    WNL  Sensory/Perceptual disturbances:    WNL  Orientation:  oriented to person, place, time/date, situation, and day of week  Attention:  Good  Concentration:  Good  Memory:  WNL  Fund of knowledge:   Good  Insight:    Good  Judgment:   Good  Impulse Control:  Good    Individualized Treatment Plan                 Strengths: bright, verbal, motivated, self aware, self advocate   Supports: spouse, family, friends, colleagues   Goal/Needs for Treatment:  In order of importance to patient  1) Develop healthy interpersonal relationships that lead to the alleviation and help prevent the relapse of depression.  2) Learn and implement coping skills that result in a reduction of anxiety and worry, and improved functioning.   3) Combine skills learned in therapy into a new daily approach to managing ADHD.   Client Statement of Needs: requests assistance with piecing back together her life as it once was before she got depressed (burnt out).   Treatment Level: Weekly Individual Outpatient Psychotherapy.  Symptoms: autonomic hyperactivity (e.g., palpitations, shortness of breath, dry mouth, trouble swallowing, nausea, diarrhea).  Childhood history of Attention Deficit Disorder (ADD) that was either diagnosed or later concluded due to the symptoms of behavioral problems  at school, impulsivity, temper outbursts, and lack of concentration.  Depressed or irritable mood. Diminished interest in or enjoyment of activities. Easily distracted and drawn from task at hand.  Excessive and/or unrealistic worry that is difficult to control occurring more days than not for at least 6 months about a number of events or activities.  Feelings of hopelessness, worthlessness, or inappropriate guilt.  Lack of energy.   Poor concentration and indecisiveness.  Restless and fidgety;  unable to be sedentary for more than a short time.   Client Treatment Preferences: Continue with current therapist   Healthcare consumer's goal for treatment:  Psychologist, Ronal Jenkins Sprung, Ph.D. will support the patient's ability to achieve the goals identified. Cognitive Behavioral Therapy, Dialectical Behavioral Therapy, Motivational Interviewing, SPACE, parent training, and other evidenced-based practices will be used to promote progress towards healthy functioning.   Healthcare consumer Olivia [name] will: Actively participate in therapy, working towards healthy functioning.    *Justification for Continuation/Discontinuation of Goal: R=Revised, O=Ongoing, A=Achieved, D=Discontinued  Goal 1) Develop healthy interpersonal relationships that lead to the alleviation and help prevent the relapse of depression.  5 Point Likert rating baseline date: 05/28/2021 Target Date Goal Was reviewed Status Code Progress towards goal/Likert rating  05/29/2023 05/28/2022          O 3/5 - learning to set better limits and boundaries, still hasn't been able to regulate her daily routines  05/25/2024 05/26/2023          O        3.75 - pt reestablished friendship but has not be consistent in reaching out, has improved her relationship with her parents with appropriate limits.  Is more accepting of her relationship with her estranged daughter as healthy for her and not a failure   05/31/2025 05/31/2024  4/5 - pt has established some new friendships, and is better at planning social events in order to spend time with these new friends   Goal 2) Learn and implement coping skills that result in a reduction of anxiety and worry, and improved daily functioning.  5 Point Likert rating baseline date: 05/28/2021  Target Date Goal Was reviewed Status Code Progress towards goal/Likert rating  05/29/2023 05/28/2022          O 2/5 - learning to use her skills more consistently  05/25/2024 05/26/2023          O 3.5 - pt is  learning to use her skills and better manage her unrealistic and perfectionist tendencies that keep her stressed and anxious  05/31/2025 05/31/2024          O 4/5 - pt is able to head off the anxiety spirals that used to over take her,     Goal 3) Combine skills learned in therapy into a new daily approach to managing ADHD.  5 Point Likert rating baseline date: 05/28/2021  Target Date Goal Was reviewed Status Code Progress towards goal/Likert rating  05/29/2023 05/28/2022          O 2/5 - learning to use her skills more consistently  05/25/2024 05/26/2023          O 3.75 - accepted the need and began medication, has been more aware, recognize, acknowledge and accept the issues related ADHD and is better able to apply strategies. Pt is kinder to herself around her limitations or need for an alternative way of approaching tasks  05/31/2025 05/31/2024          O 3.25/5 - pt feels like  she has regressed some, returning to past patterns of avoidance and procrastination   This plan has been reviewed and created by the following participants:  This plan will be reviewed at least every 12 months. Date Behavioral Health Clinician Date Guardian/Patient   05/28/2022 Ronal Jenkins Sprung, Ph.D.   05/28/2022 Olivia Olivia [name]  05/26/2023 Ronal Jenkins Sprung, Ph.D.  05/26/2023 Olivia Olivia [name]  05/31/2024 Ronal Jenkins Sprung, Ph.D. 05/31/2024 Olivia Olivia [name]         Diagnosis: Major Depressive Disorder, recurrent, in partial remission Generalized Anxiety Disorder Attention Deficit Disorder, predominantly inattentive   Olivia [name] reports that she had a bit of a set back with her weight loss since her [place]  trip.  We d/e/p reviewed her progress, how to get back on track, and understanding how cortisol levels interfere with weight loss.  We also talked about the recent events in [place] , having her PTSD triggered by her son when he shared his reaction to the events, teaching her family to limit their sharing of  traumatic events, and what she needs to do to feel safe again.  I provided guidance around setting limits with her parents and offered the support needed.    Ronal Jenkins Sprung, PhD  Son: Spurgeon (17) he is on the ASD Daughter:  Friend - Amy  Super Ego - Dickey  "

## 2024-11-30 ENCOUNTER — Other Ambulatory Visit: Payer: Self-pay

## 2024-12-02 ENCOUNTER — Other Ambulatory Visit: Payer: Self-pay

## 2024-12-02 ENCOUNTER — Other Ambulatory Visit: Payer: Self-pay | Admitting: Pharmacist

## 2024-12-02 MED ORDER — TALTZ 80 MG/ML ~~LOC~~ SOAJ: SUBCUTANEOUS | 5 refills | Status: DC

## 2024-12-02 MED ORDER — TALTZ 80 MG/ML ~~LOC~~ SOAJ
80.0000 mg | SUBCUTANEOUS | 5 refills | Status: AC
  Filled 2024-12-02: qty 1, fill #0
  Filled 2024-12-02: qty 1, 28d supply, fill #0

## 2024-12-06 ENCOUNTER — Other Ambulatory Visit (HOSPITAL_COMMUNITY): Payer: Self-pay

## 2024-12-06 ENCOUNTER — Ambulatory Visit: Admitting: Psychology

## 2024-12-06 ENCOUNTER — Other Ambulatory Visit: Payer: Self-pay

## 2024-12-06 DIAGNOSIS — F324 Major depressive disorder, single episode, in partial remission: Secondary | ICD-10-CM

## 2024-12-06 DIAGNOSIS — F3341 Major depressive disorder, recurrent, in partial remission: Secondary | ICD-10-CM | POA: Diagnosis not present

## 2024-12-06 DIAGNOSIS — F411 Generalized anxiety disorder: Secondary | ICD-10-CM | POA: Diagnosis not present

## 2024-12-06 DIAGNOSIS — F900 Attention-deficit hyperactivity disorder, predominantly inattentive type: Secondary | ICD-10-CM | POA: Diagnosis not present

## 2024-12-06 NOTE — Progress Notes (Signed)
 Specialty Pharmacy Refill Coordination Note  Spoke with Reanne Nellums is a 59 y.o. female contacted today regarding refills of specialty medication(s) Ixekizumab  (Taltz )  Doses on hand: 0  Next inj: 12/15/24   Patient requested: Delivery   Delivery date: 12/10/24   Verified address: [address] [place] [address]  Medication will be filled on 12/09/24

## 2024-12-09 ENCOUNTER — Other Ambulatory Visit (HOSPITAL_COMMUNITY): Payer: Self-pay

## 2024-12-09 ENCOUNTER — Other Ambulatory Visit: Payer: Self-pay

## 2024-12-10 ENCOUNTER — Other Ambulatory Visit (HOSPITAL_BASED_OUTPATIENT_CLINIC_OR_DEPARTMENT_OTHER): Payer: Self-pay

## 2024-12-13 ENCOUNTER — Ambulatory Visit: Admitting: Psychology

## 2024-12-20 ENCOUNTER — Ambulatory Visit: Admitting: Psychology

## 2024-12-27 ENCOUNTER — Ambulatory Visit: Admitting: Psychology

## 2025-01-03 ENCOUNTER — Ambulatory Visit: Admitting: Psychology

## 2025-01-10 ENCOUNTER — Ambulatory Visit: Admitting: Psychology

## 2025-01-17 ENCOUNTER — Ambulatory Visit: Admitting: Psychology

## 2025-01-24 ENCOUNTER — Ambulatory Visit: Admitting: Psychology

## 2025-01-31 ENCOUNTER — Ambulatory Visit: Admitting: Psychology

## 2025-02-07 ENCOUNTER — Ambulatory Visit: Admitting: Psychology

## 2025-02-14 ENCOUNTER — Ambulatory Visit: Admitting: Psychology

## 2025-02-21 ENCOUNTER — Ambulatory Visit: Admitting: Psychology

## 2025-02-28 ENCOUNTER — Ambulatory Visit: Admitting: Psychology

## 2025-03-07 ENCOUNTER — Ambulatory Visit: Admitting: Psychology

## 2025-03-14 ENCOUNTER — Ambulatory Visit: Admitting: Psychology

## 2025-03-21 ENCOUNTER — Ambulatory Visit: Admitting: Psychology

## 2025-04-04 ENCOUNTER — Ambulatory Visit: Admitting: Psychology

## 2025-04-11 ENCOUNTER — Ambulatory Visit: Admitting: Psychology

## 2025-04-18 ENCOUNTER — Ambulatory Visit: Admitting: Psychology

## 2025-04-25 ENCOUNTER — Ambulatory Visit: Admitting: Psychology

## 2025-05-02 ENCOUNTER — Ambulatory Visit: Admitting: Psychology

## 2025-05-09 ENCOUNTER — Ambulatory Visit: Admitting: Psychology

## 2025-05-16 ENCOUNTER — Ambulatory Visit: Admitting: Psychology

## 2025-05-23 ENCOUNTER — Ambulatory Visit: Admitting: Psychology
